# Patient Record
Sex: Female | Born: 1945 | Race: White | Hispanic: No | Marital: Married | Smoking: Never smoker
Health system: Southern US, Community
[De-identification: ages and names within clinical notes are randomized; demographics above are authoritative.]

## PROBLEM LIST (undated history)

## (undated) DIAGNOSIS — G8929 Other chronic pain: Secondary | ICD-10-CM

## (undated) DIAGNOSIS — E876 Hypokalemia: Secondary | ICD-10-CM

## (undated) DIAGNOSIS — Z8489 Family history of other specified conditions: Secondary | ICD-10-CM

## (undated) DIAGNOSIS — C50919 Malignant neoplasm of unspecified site of unspecified female breast: Secondary | ICD-10-CM

## (undated) DIAGNOSIS — T4145XA Adverse effect of unspecified anesthetic, initial encounter: Secondary | ICD-10-CM

## (undated) DIAGNOSIS — Z972 Presence of dental prosthetic device (complete) (partial): Secondary | ICD-10-CM

## (undated) DIAGNOSIS — E782 Mixed hyperlipidemia: Secondary | ICD-10-CM

## (undated) DIAGNOSIS — K219 Gastro-esophageal reflux disease without esophagitis: Secondary | ICD-10-CM

## (undated) DIAGNOSIS — H9190 Unspecified hearing loss, unspecified ear: Secondary | ICD-10-CM

## (undated) DIAGNOSIS — M810 Age-related osteoporosis without current pathological fracture: Secondary | ICD-10-CM

## (undated) DIAGNOSIS — K5730 Diverticulosis of large intestine without perforation or abscess without bleeding: Secondary | ICD-10-CM

## (undated) DIAGNOSIS — R0989 Other specified symptoms and signs involving the circulatory and respiratory systems: Secondary | ICD-10-CM

## (undated) DIAGNOSIS — I5032 Chronic diastolic (congestive) heart failure: Secondary | ICD-10-CM

## (undated) DIAGNOSIS — I272 Pulmonary hypertension, unspecified: Secondary | ICD-10-CM

## (undated) DIAGNOSIS — R7303 Prediabetes: Secondary | ICD-10-CM

## (undated) DIAGNOSIS — M1990 Unspecified osteoarthritis, unspecified site: Secondary | ICD-10-CM

## (undated) DIAGNOSIS — R519 Headache, unspecified: Secondary | ICD-10-CM

## (undated) DIAGNOSIS — D649 Anemia, unspecified: Secondary | ICD-10-CM

## (undated) DIAGNOSIS — I10 Essential (primary) hypertension: Secondary | ICD-10-CM

## (undated) DIAGNOSIS — K449 Diaphragmatic hernia without obstruction or gangrene: Secondary | ICD-10-CM

## (undated) DIAGNOSIS — R079 Chest pain, unspecified: Secondary | ICD-10-CM

## (undated) DIAGNOSIS — T8859XA Other complications of anesthesia, initial encounter: Secondary | ICD-10-CM

## (undated) DIAGNOSIS — R51 Headache: Secondary | ICD-10-CM

## (undated) DIAGNOSIS — R011 Cardiac murmur, unspecified: Secondary | ICD-10-CM

## (undated) DIAGNOSIS — I340 Nonrheumatic mitral (valve) insufficiency: Secondary | ICD-10-CM

## (undated) DIAGNOSIS — K222 Esophageal obstruction: Secondary | ICD-10-CM

## (undated) DIAGNOSIS — G629 Polyneuropathy, unspecified: Secondary | ICD-10-CM

## (undated) HISTORY — DX: Other chronic pain: G89.29

## (undated) HISTORY — PX: OOPHORECTOMY: SHX86

## (undated) HISTORY — DX: Unspecified hearing loss, unspecified ear: H91.90

## (undated) HISTORY — DX: Esophageal obstruction: K22.2

## (undated) HISTORY — DX: Hypokalemia: E87.6

## (undated) HISTORY — DX: Other specified symptoms and signs involving the circulatory and respiratory systems: R09.89

## (undated) HISTORY — DX: Chronic diastolic (congestive) heart failure: I50.32

## (undated) HISTORY — DX: Headache, unspecified: R51.9

## (undated) HISTORY — DX: Chest pain, unspecified: R07.9

## (undated) HISTORY — DX: Diverticulosis of large intestine without perforation or abscess without bleeding: K57.30

## (undated) HISTORY — DX: Mixed hyperlipidemia: E78.2

## (undated) HISTORY — DX: Unspecified osteoarthritis, unspecified site: M19.90

## (undated) HISTORY — DX: Pulmonary hypertension, unspecified: I27.20

## (undated) HISTORY — DX: Malignant neoplasm of unspecified site of unspecified female breast: C50.919

## (undated) HISTORY — PX: JOINT REPLACEMENT: SHX530

## (undated) HISTORY — DX: Essential (primary) hypertension: I10

## (undated) HISTORY — DX: Nonrheumatic mitral (valve) insufficiency: I34.0

## (undated) HISTORY — DX: Gastro-esophageal reflux disease without esophagitis: K21.9

## (undated) HISTORY — DX: Diaphragmatic hernia without obstruction or gangrene: K44.9

## (undated) HISTORY — PX: CARDIAC CATHETERIZATION: SHX172

## (undated) HISTORY — DX: Headache: R51

---

## 1976-01-22 HISTORY — PX: ABDOMINAL HYSTERECTOMY: SHX81

## 1976-01-22 HISTORY — PX: CHOLECYSTECTOMY: SHX55

## 2001-08-14 ENCOUNTER — Encounter: Payer: Self-pay | Admitting: Gastroenterology

## 2003-05-06 ENCOUNTER — Encounter: Payer: Self-pay | Admitting: Gastroenterology

## 2003-11-29 ENCOUNTER — Ambulatory Visit: Payer: Self-pay | Admitting: Family Medicine

## 2003-12-03 ENCOUNTER — Emergency Department: Payer: Self-pay | Admitting: Emergency Medicine

## 2005-01-24 ENCOUNTER — Other Ambulatory Visit: Payer: Self-pay

## 2005-01-25 ENCOUNTER — Emergency Department: Payer: Self-pay | Admitting: Emergency Medicine

## 2005-08-25 ENCOUNTER — Emergency Department: Payer: Self-pay | Admitting: Unknown Physician Specialty

## 2007-02-25 ENCOUNTER — Ambulatory Visit: Payer: Self-pay | Admitting: Family Medicine

## 2007-03-04 ENCOUNTER — Ambulatory Visit: Payer: Self-pay | Admitting: Family Medicine

## 2007-12-18 ENCOUNTER — Emergency Department: Payer: Self-pay | Admitting: Emergency Medicine

## 2008-01-24 ENCOUNTER — Emergency Department: Payer: Self-pay | Admitting: Emergency Medicine

## 2008-02-03 ENCOUNTER — Inpatient Hospital Stay: Payer: Self-pay | Admitting: Internal Medicine

## 2008-09-06 ENCOUNTER — Encounter: Payer: Self-pay | Admitting: Orthopedic Surgery

## 2008-09-21 ENCOUNTER — Encounter: Payer: Self-pay | Admitting: Orthopedic Surgery

## 2008-10-21 ENCOUNTER — Encounter: Payer: Self-pay | Admitting: Orthopedic Surgery

## 2008-11-03 ENCOUNTER — Ambulatory Visit: Payer: Self-pay | Admitting: Family Medicine

## 2008-11-03 DIAGNOSIS — Z8711 Personal history of peptic ulcer disease: Secondary | ICD-10-CM

## 2008-11-03 DIAGNOSIS — G43009 Migraine without aura, not intractable, without status migrainosus: Secondary | ICD-10-CM | POA: Insufficient documentation

## 2008-11-03 DIAGNOSIS — K5730 Diverticulosis of large intestine without perforation or abscess without bleeding: Secondary | ICD-10-CM | POA: Insufficient documentation

## 2008-11-03 DIAGNOSIS — I10 Essential (primary) hypertension: Secondary | ICD-10-CM | POA: Insufficient documentation

## 2008-11-03 DIAGNOSIS — I152 Hypertension secondary to endocrine disorders: Secondary | ICD-10-CM | POA: Insufficient documentation

## 2008-11-03 DIAGNOSIS — K449 Diaphragmatic hernia without obstruction or gangrene: Secondary | ICD-10-CM | POA: Insufficient documentation

## 2008-11-03 DIAGNOSIS — R011 Cardiac murmur, unspecified: Secondary | ICD-10-CM | POA: Insufficient documentation

## 2008-11-03 DIAGNOSIS — E1159 Type 2 diabetes mellitus with other circulatory complications: Secondary | ICD-10-CM | POA: Insufficient documentation

## 2008-11-03 DIAGNOSIS — J301 Allergic rhinitis due to pollen: Secondary | ICD-10-CM

## 2008-11-08 ENCOUNTER — Ambulatory Visit: Payer: Self-pay | Admitting: Gastroenterology

## 2008-11-08 ENCOUNTER — Encounter (INDEPENDENT_AMBULATORY_CARE_PROVIDER_SITE_OTHER): Payer: Self-pay | Admitting: *Deleted

## 2008-11-09 ENCOUNTER — Ambulatory Visit: Payer: Self-pay | Admitting: Gastroenterology

## 2008-11-29 ENCOUNTER — Ambulatory Visit: Payer: Self-pay | Admitting: Family Medicine

## 2008-12-02 ENCOUNTER — Telehealth (INDEPENDENT_AMBULATORY_CARE_PROVIDER_SITE_OTHER): Payer: Self-pay | Admitting: Internal Medicine

## 2009-01-26 ENCOUNTER — Ambulatory Visit: Payer: Self-pay | Admitting: Family Medicine

## 2009-01-26 LAB — CONVERTED CEMR LAB: LDL Cholesterol: 79 mg/dL

## 2009-01-27 LAB — CONVERTED CEMR LAB
ALT: 16 units/L (ref 0–35)
AST: 22 units/L (ref 0–37)
BUN: 12 mg/dL (ref 6–23)
Bilirubin, Direct: 0 mg/dL (ref 0.0–0.3)
CO2: 23 meq/L (ref 19–32)
Chloride: 109 meq/L (ref 96–112)
Cholesterol: 174 mg/dL (ref 0–200)
Creatinine, Ser: 0.7 mg/dL (ref 0.4–1.2)
Potassium: 3.9 meq/L (ref 3.5–5.1)
Total Bilirubin: 0.6 mg/dL (ref 0.3–1.2)
Total Protein: 6.4 g/dL (ref 6.0–8.3)

## 2009-01-30 ENCOUNTER — Telehealth: Payer: Self-pay | Admitting: Family Medicine

## 2009-02-03 ENCOUNTER — Ambulatory Visit: Payer: Self-pay | Admitting: Family Medicine

## 2009-02-03 DIAGNOSIS — E1140 Type 2 diabetes mellitus with diabetic neuropathy, unspecified: Secondary | ICD-10-CM | POA: Insufficient documentation

## 2009-02-03 DIAGNOSIS — E1169 Type 2 diabetes mellitus with other specified complication: Secondary | ICD-10-CM | POA: Insufficient documentation

## 2009-02-03 DIAGNOSIS — E7800 Pure hypercholesterolemia, unspecified: Secondary | ICD-10-CM

## 2009-02-03 DIAGNOSIS — E785 Hyperlipidemia, unspecified: Secondary | ICD-10-CM | POA: Insufficient documentation

## 2009-02-08 ENCOUNTER — Encounter: Payer: Self-pay | Admitting: Family Medicine

## 2009-05-05 ENCOUNTER — Ambulatory Visit: Payer: Self-pay | Admitting: Family Medicine

## 2009-05-08 ENCOUNTER — Telehealth: Payer: Self-pay | Admitting: Family Medicine

## 2009-05-10 ENCOUNTER — Encounter: Payer: Self-pay | Admitting: Family Medicine

## 2009-05-10 ENCOUNTER — Telehealth: Payer: Self-pay | Admitting: Family Medicine

## 2009-05-10 LAB — CONVERTED CEMR LAB
ALT: 14 units/L (ref 0–35)
AST: 17 units/L (ref 0–37)
Alkaline Phosphatase: 65 units/L (ref 39–117)
BUN: 16 mg/dL (ref 6–23)
Bilirubin, Direct: 0 mg/dL (ref 0.0–0.3)
Chloride: 103 meq/L (ref 96–112)
GFR calc non Af Amer: 106.33 mL/min (ref >60)
HDL: 71.1 mg/dL (ref >39.00)
Potassium: 4.2 meq/L (ref 3.5–5.1)
Sodium: 140 meq/L (ref 135–145)
Total Bilirubin: 0.3 mg/dL (ref 0.3–1.2)
VLDL: 26 mg/dL (ref 0.0–40.0)

## 2009-05-18 ENCOUNTER — Encounter: Payer: Self-pay | Admitting: Family Medicine

## 2009-05-18 ENCOUNTER — Telehealth (INDEPENDENT_AMBULATORY_CARE_PROVIDER_SITE_OTHER): Payer: Self-pay | Admitting: *Deleted

## 2009-05-19 ENCOUNTER — Telehealth: Payer: Self-pay | Admitting: Family Medicine

## 2009-06-30 ENCOUNTER — Ambulatory Visit: Payer: Self-pay | Admitting: Family Medicine

## 2009-07-21 ENCOUNTER — Ambulatory Visit: Payer: Self-pay | Admitting: Family Medicine

## 2009-07-25 LAB — CONVERTED CEMR LAB
AST: 17 units/L (ref 0–37)
Albumin: 4 g/dL (ref 3.5–5.2)
Alkaline Phosphatase: 79 units/L (ref 39–117)
Bilirubin, Direct: 0.1 mg/dL (ref 0.0–0.3)
Calcium: 9.2 mg/dL (ref 8.4–10.5)
GFR calc non Af Amer: 106.26 mL/min (ref >60)
Glucose, Bld: 105 mg/dL — ABNORMAL HIGH (ref 70–99)
HDL: 67.9 mg/dL (ref >39.00)
Potassium: 4.1 meq/L (ref 3.5–5.1)
Sodium: 141 meq/L (ref 135–145)
Total Bilirubin: 0.3 mg/dL (ref 0.3–1.2)
Total CHOL/HDL Ratio: 3
Triglycerides: 119 mg/dL (ref 0.0–149.0)
VLDL: 23.8 mg/dL (ref 0.0–40.0)

## 2009-07-26 ENCOUNTER — Ambulatory Visit: Payer: Self-pay | Admitting: Family Medicine

## 2009-09-15 ENCOUNTER — Telehealth (INDEPENDENT_AMBULATORY_CARE_PROVIDER_SITE_OTHER): Payer: Self-pay | Admitting: *Deleted

## 2009-09-15 ENCOUNTER — Ambulatory Visit: Payer: Self-pay | Admitting: Family Medicine

## 2009-09-29 ENCOUNTER — Telehealth: Payer: Self-pay | Admitting: Family Medicine

## 2009-10-19 ENCOUNTER — Telehealth: Payer: Self-pay | Admitting: Family Medicine

## 2009-11-08 ENCOUNTER — Telehealth: Payer: Self-pay | Admitting: Family Medicine

## 2009-11-16 ENCOUNTER — Ambulatory Visit: Payer: Self-pay | Admitting: Family Medicine

## 2009-11-23 ENCOUNTER — Ambulatory Visit: Payer: Self-pay | Admitting: Family Medicine

## 2009-11-23 DIAGNOSIS — IMO0002 Reserved for concepts with insufficient information to code with codable children: Secondary | ICD-10-CM | POA: Insufficient documentation

## 2009-11-23 DIAGNOSIS — M1710 Unilateral primary osteoarthritis, unspecified knee: Secondary | ICD-10-CM

## 2009-11-30 ENCOUNTER — Ambulatory Visit: Payer: Self-pay | Admitting: Family Medicine

## 2009-12-20 ENCOUNTER — Ambulatory Visit: Payer: Self-pay | Admitting: Family Medicine

## 2009-12-22 LAB — CONVERTED CEMR LAB
ALT: 12 units/L (ref 0–35)
AST: 19 units/L (ref 0–37)
BUN: 16 mg/dL (ref 6–23)
Bilirubin, Direct: 0.1 mg/dL (ref 0.0–0.3)
CO2: 29 meq/L (ref 19–32)
Chloride: 104 meq/L (ref 96–112)
Cholesterol: 188 mg/dL (ref 0–200)
Creatinine,U: 136.9 mg/dL
HDL: 59 mg/dL (ref >39.00)
LDL Cholesterol: 96 mg/dL (ref 0–99)
Potassium: 3.8 meq/L (ref 3.5–5.1)
Total Bilirubin: 0.3 mg/dL (ref 0.3–1.2)
Total Protein: 6.6 g/dL (ref 6.0–8.3)
Triglycerides: 163 mg/dL — ABNORMAL HIGH (ref 0.0–149.0)

## 2009-12-27 ENCOUNTER — Ambulatory Visit: Payer: Self-pay | Admitting: Family Medicine

## 2009-12-27 DIAGNOSIS — E1142 Type 2 diabetes mellitus with diabetic polyneuropathy: Secondary | ICD-10-CM | POA: Insufficient documentation

## 2009-12-27 LAB — CONVERTED CEMR LAB: Cholesterol, target level: 200 mg/dL

## 2010-02-07 ENCOUNTER — Telehealth: Payer: Self-pay | Admitting: Family Medicine

## 2010-02-20 ENCOUNTER — Encounter: Payer: Self-pay | Admitting: Family Medicine

## 2010-02-20 NOTE — Progress Notes (Signed)
Summary: wants ortho referral  Phone Note Call from Patient Call back at [phone]   Caller: Patient Call For: Ashley Nora MD Summary of Call: Pt requests referral to ortho regarding her arthritis.  She has been going to [place] for years but wants to change.  She prefers to see someone in [place]. Initial call taken by: Lowella Petties CMA,  May 18, 2009 2:26 PM  Follow-up for Phone Call        Orthep appt has been scheduled for May 4th in [place].Daine Gip  May 29, 2009 2:38 PM  Follow-up by: Daine Gip,  May 29, 2009 2:38 PM

## 2010-02-20 NOTE — Progress Notes (Signed)
Summary: Diabetic supplies ([facility])  Phone Note From Pharmacy   Caller: [facility] Call For: Dr. Ermalene Searing  Summary of Call: Received faxed form for diabetic supplies.  Form in your IN box.   Initial call taken by: Linde Gillis CMA [facility]),  May 19, 2009 9:22 AM

## 2010-02-20 NOTE — Miscellaneous (Signed)
Summary: Waiver of Libaility Form  Waiver of Libaility Form   Imported By: Beau Fanny 11/16/2009 13:46:49  _____________________________________________________________________  External Attachment:    Type:   Image     Comment:   External Document

## 2010-02-20 NOTE — Progress Notes (Signed)
Summary: Synivisc Injection...  Phone Note Outgoing Call   Summary of Call: FYI... Pt is interested in the Synvisc Injection.  She will be back in contact w/ use once she speak w/ her ins co..Cynthia Davis  October 19, 2009 9:12 AM  Initial call taken by: Daine Gip,  October 19, 2009 9:12 AM

## 2010-02-20 NOTE — Progress Notes (Signed)
Summary: Synvisc form faxed for ins vertification....  Phone Note Outgoing Call   Summary of Call: Faxed for to vertify ins coverage for Synvisc.Marland KitchenMarland KitchenDaine Gip  September 15, 2009 2:12 PM  Initial call taken by: Daine Gip,  September 15, 2009 2:12 PM  Follow-up for Phone Call        Synvisc is now using a new form. Sent to Dr. Patsy Lager to sign and return to be refaxed.Marland KitchenMarland KitchenDaine Gip  September 18, 2009 1:46 PM  Follow-up by: Daine Gip,  September 18, 2009 1:46 PM  Additional Follow-up for Phone Call Additional follow up Details #1::        Received Form back from Dr. Patsy Lager,  Faxed to [employer].Marland KitchenMarland KitchenDaine Gip  September 19, 2009 11:11 AM  Additional Follow-up by: Daine Gip,  September 19, 2009 11:12 AM

## 2010-02-20 NOTE — Miscellaneous (Signed)
Summary: Waiver of Liability Form  Waiver of Liability Form   Imported By: Beau Fanny 11/23/2009 14:00:30  _____________________________________________________________________  External Attachment:    Type:   Image     Comment:   External Document

## 2010-02-20 NOTE — Assessment & Plan Note (Signed)
Summary: SYNVISC INJECTION  CYD   Vital Signs:  Patient profile:   65 year old female Height:      60 inches Weight:      150 pounds BMI:     29.40 Temp:     97.8 degrees F oral Pulse rate:   80 / minute Pulse rhythm:   regular BP sitting:   128 / 76  (left arm) Cuff size:   regular  Vitals Entered By: Lewanda Rife LPN (November 16, 2009 10:52 AM) CC: injection,synvisc   History of Present Illness: Right knee, synvisc series.  Allergies: 1)  ! Sulfa   Impression & Recommendations:  Problem # 1:  OSTEOARTHRITIS, KNEE, RIGHT (ICD-715.96)  Knee Injection: Synvisc-3, R Patient verbally consented to procedure. Risks, benefits, and alternatives explained. Sterilely prepped with betadine. Ethyl cholride used for anesthesia, then 5 cc of Lidocaine 1% used for anesthesia in the anterolateral position. Reprepped with Betadine.  Anterolateral approach without difficulty, injected with Synvisc-3, 2 mL. No complications with procedure and tolerated well.   Her updated medication list for this problem includes:    Aspirin 81 Mg Tbec (Aspirin) ..... One a day    Tramadol Hcl 50 Mg Tabs (Tramadol hcl) ..... One tablet by mouth every day as needed for knee pain  Orders: Joint Aspirate / Injection, Large (16109) Synvisc-Three (16 units) (U0454)  Complete Medication List: 1)  Hydrochlorothiazide 25 Mg Tabs (Hydrochlorothiazide) .... One a day 2)  Amlodipine Besylate 5 Mg Tabs (Amlodipine besylate) .... One a day 3)  Fluoxetine Hcl 20 Mg Caps (Fluoxetine hcl) .... One a day 4)  Nexium 40 Mg Cpdr (Esomeprazole magnesium) .Marland Kitchen.. 1 tab by mouth daily 5)  Aspirin 81 Mg Tbec (Aspirin) .... One a day 6)  Flonase 50 Mcg/act Susp (Fluticasone propionate) .... As needed 7)  Tenormin 100 Mg Tabs (Atenolol) .... One a day 8)  Claritin 10 Mg Tabs (Loratadine) .... One a day 9)  Calcium Plus Vitamin D 600-100 Mg-unit Caps (Calcium carbonate-vitamin d) .... One a day 10)  Glucosamine 500 Mg Caps  (Glucosamine sulfate) .Marland Kitchen.. 1 a day 11)  Natural Fiber Therapy 30.9 % Powd (Psyllium) .Marland Kitchen.. 1 a day 12)  Tramadol Hcl 50 Mg Tabs (Tramadol hcl) .... One tablet by mouth every day as needed for knee pain  Other Orders: Flu Vaccine 72yrs + MEDICARE PATIENTS (U9811) Administration Flu vaccine - MCR (B1478) Prescriptions: TRAMADOL HCL 50 MG TABS (TRAMADOL HCL) one tablet by mouth every day as needed for knee pain  #50 x 3   Entered and Authorized by:   Hannah Beat MD   Signed by:   Hannah Beat MD on 11/16/2009   Method used:   Print then Give to Patient   RxID:   [id number]    Orders Added: 1)  Flu Vaccine 8yrs + MEDICARE PATIENTS [Q2039] 2)  Administration Flu vaccine - MCR [G0008] 3)  Joint Aspirate / Injection, Large [20610] 4)  Synvisc-Three (16 units) [J7325]    Current Allergies (reviewed today): ! SULFAFlu Vaccine Consent Questions     Do you have a history of severe allergic reactions to this vaccine? no    Any prior history of allergic reactions to egg and/or gelatin? no    Do you have a sensitivity to the preservative Thimersol? no    Do you have a past history of Guillan-Barre Syndrome? no    Do you currently have an acute febrile illness? no    Have you ever had a severe reaction  to latex? no    Vaccine information given and explained to patient? yes    Are you currently pregnant? no    Lot Number:[id number]   Exp Date:07/21/2010   Site Given  Left Deltoid IM Lewanda Rife LPN  November 16, 2009 10:58 AM     .Jacquelyne Balint

## 2010-02-20 NOTE — Medication Information (Signed)
Summary: Diabetes Supplies/[facility]  Diabetes Supplies/[facility]   Imported By: Lanelle Bal 06/09/2009 14:07:03  _____________________________________________________________________  External Attachment:    Type:   Image     Comment:   External Document

## 2010-02-20 NOTE — Assessment & Plan Note (Signed)
Summary: 6 M F/U DLO   Vital Signs:  Patient profile:   65 year old female Height:      60 inches Weight:      149.0 pounds BMI:     29.20 Temp:     98.1 degrees F oral Pulse rate:   76 / minute Pulse rhythm:   regular BP sitting:   110 / 70  (left arm) Cuff size:   regular  Vitals Entered By: Benny Lennert CMA [facility]) (December 27, 2009 8:31 AM)  History of Present Illness: Chief complaint 6 month follow up   Right Knee Pain.Marland KitchenSynvisc injections by Dr. Patsy Lager last injection 1 month ago...minimal improvement. Has [facility] Riley Lam [facility]...in past for arthroscopic surgery 5 years ago. Keeping her up at night.  Plans to call Dr. Riley Lam after Christmas.  5 lb weight loss..not able to exercise due to knee.   Hypertension History:      She denies headache, chest pain, palpitations, orthopnea, neurologic problems, and syncope.  Well Controlled at home.        Positive major cardiovascular risk factors include female age 33 years old or older, diabetes, hyperlipidemia, and hypertension.  Negative major cardiovascular risk factors include non-tobacco-user status.    Lipid Management History:      Positive NCEP/ATP III risk factors include female age 21 years old or older, diabetes, and hypertension.  Negative NCEP/ATP III risk factors include non-tobacco-user status.        Her compliance with the TLC diet is good.  Adjunctive measures started by the patient include fiber and weight reduction.  She expresses no side effects from her lipid-lowering medication.  Comments: Cutting back on sweet items.    Problems Prior to Update: 1)  Osteoarthritis, Knee, Right  (ICD-715.96) 2)  Screening, Pulmonary Tuberculosis  (ICD-V74.1) 3)  Other Screening Mammogram  (ICD-V76.12) 4)  Routine Gynecological Examination  (ICD-V72.31) 5)  Preventive Health Care  (ICD-V70.0) 6)  Hypertriglyceridemia  (ICD-272.1) 7)  Diabetes Mellitus, Borderline  (ICD-790.29) 8)  Sinusitis- Acute-nos  (ICD-461.9) 9)   Essential Hypertension, Benign  (ICD-401.1) 10)  Ankle Pain, Left  (ICD-719.47) 11)  Dysphagia Unspecified  (ICD-787.20) 12)  Diverticulosis of Colon  (ICD-562.10) 13)  Osteoarthritis, Knee, Right  (ICD-715.96) 14)  Hiatal Hernia With Reflux  (ICD-553.3) 15)  Family History Breast Cancer 1st Degree Relative <50  (ICD-V16.3) 16)  Murmur  (ICD-785.2) 17)  Allergic Rhinitis Due To Pollen  (ICD-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 Sulfa  Past History:  Past medical, surgical, family and social histories (including risk factors) reviewed, and no changes noted (except as noted below).  Past Medical History: Reviewed history from 11/08/2008 and no changes required. arthritis Chronic headaches Diverticulosis Hypertension  Past Surgical History: Reviewed history from 02/03/2009 and no changes required. cholecystectomy 1987 Hysterectomy, patrial..one ovary remains  Family History: Reviewed history from 11/08/2008 and no changes required. no colon cancer or esophagus cancer  Social History: Reviewed history from 11/29/2008 and no changes required. drinks sweet tea, diet sodas 4 a day married, 2 children, retired, nonsmoker, does not drink alcohol. occupation--retired CNA  Review of Systems       some burning in toes, bottoms of feet General:  Denies fatigue and fever. CV:  Denies chest pain or discomfort. Resp:  Denies shortness of breath. GI:  Denies abdominal pain,  bloody stools, constipation, and diarrhea. GU:  Denies dysuria.  Physical Exam  General:  Well-developed,well-nourished,in no acute distress; alert,appropriate and cooperative throughout examination Mouth:  Oral mucosa and oropharynx without lesions or exudates.  Teeth in good repair. Neck:  no cervical or supraclavicular lymphadenopathy no carotid bruit or thyromegaly  Lungs:   Normal respiratory effort, chest expands symmetrically. Lungs are clear to auscultation, no crackles or wheezes. Heart:  Normal rate and regular rhythm. S1 and S2 normal without gallop, murmur, click, rub or other extra sounds. Abdomen:  Bowel sounds positive,abdomen soft and non-tender without masses, organomegaly or hernias noted. Pulses:  R and L posterior tibial pulses are full and equal bilaterally  Extremities:  no edema, B varicosities  Diabetes Management Exam:    Foot Exam (with socks and/or shoes not present):       Sensory-Pinprick/Light touch:          Left medial foot (L-4): normal          Left dorsal foot (L-5): normal          Left lateral foot (S-1): normal          Right medial foot (L-4): normal          Right dorsal foot (L-5): normal          Right lateral foot (S-1): normal       Sensory-Monofilament:          Left foot: diminished          Right foot: diminished       Inspection:          Left foot: normal          Right foot: normal       Nails:          Left foot: normal          Right foot: normal   Impression & Recommendations:  Problem # 1:  DIABETES MELLITUS, TYPE II (ICD-250.00) Well controlled with lifestyle. Encouraged exercise, weight loss, healthy eating habits.  Her updated medication list for this problem includes:    Aspirin 81 Mg Tbec (Aspirin) ..... One a day  Problem # 2:  ESSENTIAL HYPERTENSION, BENIGN (ICD-401.1)  Well controlled. Continue current medication.  Her updated medication list for this problem includes:    Hydrochlorothiazide 25 Mg Tabs (Hydrochlorothiazide) ..... One a day    Amlodipine Besylate 5 Mg Tabs (Amlodipine besylate) ..... One a day    Tenormin 100 Mg Tabs (Atenolol) ..... One a day  BP today: 110/70 Prior BP: 120/80 (11/30/2009)  10 Yr Risk Heart Disease: 8 % Prior 10 Yr Risk Heart Disease: 7 % (02/03/2009)  Labs Reviewed: K+: 3.8 (12/20/2009) Creat: : 0.6 (12/20/2009)   Chol: 188 (12/20/2009)   HDL:  59.00 (12/20/2009)   LDL: 96 (12/20/2009)   TG: 163.0 (12/20/2009)  Problem # 3:  HYPERTRIGLYCERIDEMIA (ICD-272.1) Triglyceridedes minimally elevated. LDL at goal <100. Encouraged exercise, weight loss, healthy eating habits.   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 Will check TSH and B12 at next lab check.  Her updated medication list for this problem includes:    Aspirin 81 Mg Tbec (Aspirin) ..... One a day  Complete Medication List: 1)  Hydrochlorothiazide 25 Mg Tabs (Hydrochlorothiazide) .... One a day 2)  Amlodipine Besylate 5 Mg Tabs (Amlodipine besylate) .... One a day 3)  Fluoxetine Hcl 20 Mg Caps (Fluoxetine hcl) .... One a  day 4)  Nexium 40 Mg Cpdr (Esomeprazole magnesium) .Marland Kitchen.. 1 tab by mouth daily 5)  Aspirin 81 Mg Tbec (Aspirin) .... One a day 6)  Flonase 50 Mcg/act Susp (Fluticasone propionate) .... As needed 7)  Tenormin 100 Mg Tabs (Atenolol) .... One a day 8)  Claritin 10 Mg Tabs (Loratadine) .... One a day 9)  Calcium Plus Vitamin D 600-100 Mg-unit Caps (Calcium carbonate-vitamin d) .... One a day 10)  Glucosamine 500 Mg Caps (Glucosamine sulfate) .Marland Kitchen.. 1 a day 11)  Natural Fiber Therapy 30.9 % Powd (Psyllium) .Marland Kitchen.. 1 a day 12)  Tramadol Hcl 50 Mg Tabs (Tramadol hcl) .... One tablet by mouth every day as needed for knee pain 13)  Gabapentin 100 Mg Caps (Gabapentin) .Marland Kitchen.. 1 tab by mouth at bedtime for peripheral neuropathy, may increase to 2 tabs daily if tolerated and symptoms not resolved.  Hypertension Assessment/Plan:      The patient's hypertensive risk group is category C: Target organ damage and/or diabetes.  Her calculated 10 year risk of coronary heart disease is 8 %.  Today's blood pressure is 110/70.  Her blood pressure goal is < 140/90.  Lipid Assessment/Plan:      Based on NCEP/ATP III, the patient's risk factor category is "history of diabetes".  The patient's lipid goals are  as follows: Total cholesterol goal is 200; LDL cholesterol goal is 100; HDL cholesterol goal is 40; Triglyceride goal is 150.  Her LDL cholesterol goal has been met.    Patient Instructions: 1)  Please schedule a follow-up appointment in 3 months for CPX. 2)  HgBA1c prior to visit  ICD-9: 250.00 3)  TSH, B12 Dx 780.79 Prescriptions: GABAPENTIN 100 MG CAPS (GABAPENTIN) 1 tab by mouth at bedtime for peripheral neuropathy, may increase to 2 tabs daily if tolerated and symptoms not resolved.  #30 x 1   Entered and Authorized by:   Kerby Nora MD   Signed by:   Kerby Nora MD on 12/27/2009   Method used:   Electronically to        [facility] [phone]* (retail)       [address]       [place], [place]  [address]       Ph: [phone]       Fax: [phone]   RxID:   [id number] NEXIUM 40 MG CPDR (ESOMEPRAZOLE MAGNESIUM) 1 tab by mouth daily  #90 x 3   Entered and Authorized by:   Kerby Nora MD   Signed by:   Kerby Nora MD on 12/27/2009   Method used:   Electronically to        [facility] [phone]* (retail)       [address]       [place], [place]  [address]       Ph: [phone]       Fax: [phone]   RxID:   [id number] TENORMIN 100 MG TABS (ATENOLOL) one a day  #90 x 3   Entered and Authorized by:   Kerby Nora MD   Signed by:   Kerby Nora MD on 12/27/2009   Method used:   Faxed to ...       [facility] (mail-order)             , [place]         Ph: [phone]  Fax: [phone]   RxID:   [id number] FLONASE 50 MCG/ACT SUSP (FLUTICASONE PROPIONATE) as needed  #3 x 3   Entered and Authorized by:   Kerby Nora MD   Signed by:   Kerby Nora MD on 12/27/2009   Method used:   Faxed to ...       [facility] (mail-order)             , [place]         Ph: [phone]       Fax: [phone]   RxID:   [id number] FLUOXETINE HCL 20 MG CAPS (FLUOXETINE HCL) one a day  #90 x 3   Entered and Authorized by:   Kerby Nora  MD   Signed by:   Kerby Nora MD on 12/27/2009   Method used:   Faxed to ...       [facility] (mail-order)             , [place]         Ph: [phone]       Fax: [phone]   RxID:   [id number] AMLODIPINE BESYLATE 5 MG TABS (AMLODIPINE BESYLATE) one a day  #90 x 3   Entered and Authorized by:   Kerby Nora MD   Signed by:   Kerby Nora MD on 12/27/2009   Method used:   Faxed to ...       [facility] (mail-order)             , [place]         Ph: [phone]       Fax: [phone]   RxID:   [id number] HYDROCHLOROTHIAZIDE 25 MG TABS (HYDROCHLOROTHIAZIDE) one a day  #90 x 3   Entered and Authorized by:   Kerby Nora MD   Signed by:   Kerby Nora MD on 12/27/2009   Method used:   Faxed to ...       [facility] (mail-order)             , [place]         Ph: [phone]       Fax: [phone]   RxID:   [id number]      Current Allergies (reviewed today): ! SULFA  Last Flu Vaccine:  Fluvax 3+ (11/16/2009 10:36:03 AM) Flu Vaccine Next Due:  1 yr Last LDL:  96 (12/20/2009 8:37:16 AM) LDL Next Due:  6 mo

## 2010-02-20 NOTE — Assessment & Plan Note (Signed)
Summary: SYNVISC INJECTION  CYD   Vital Signs:  Patient profile:   65 year old female Height:      60 inches Weight:      150.0 pounds BMI:     29.40 Temp:     98.1 degrees F oral Pulse rate:   76 / minute Pulse rhythm:   regular BP sitting:   120 / 82  (left arm) Cuff size:   regular  Vitals Entered By: Benny Lennert CMA [facility] Dull) (November 23, 2009 11:01 AM)  History of Present Illness: Chief complaint synvisc injection  Allergies: 1)  ! Sulfa   Impression & Recommendations:  Problem # 1:  OSTEOARTHRITIS, KNEE, RIGHT (ICD-715.96)  Knee Injection: Synvisc-3, RIGHT, # 2 of series Patient verbally consented to procedure. Risks, benefits, and alternatives explained. Sterilely prepped with betadine. Ethyl cholride used for anesthesia, then 5 cc of Lidocaine 1% used for anesthesia in the anterolateral position. Reprepped with Betadine.  Anterolateral approach without difficulty, injected with Synvisc-3, 2 mL. No complications with procedure and tolerated well.   Her updated medication list for this problem includes:    Aspirin 81 Mg Tbec (Aspirin) ..... One a day    Tramadol Hcl 50 Mg Tabs (Tramadol hcl) ..... One tablet by mouth every day as needed for knee pain  Orders: Joint Aspirate / Injection, Large (24401) Synvisc-Three (16 units) (U2725)  Complete Medication List: 1)  Hydrochlorothiazide 25 Mg Tabs (Hydrochlorothiazide) .... One a day 2)  Amlodipine Besylate 5 Mg Tabs (Amlodipine besylate) .... One a day 3)  Fluoxetine Hcl 20 Mg Caps (Fluoxetine hcl) .... One a day 4)  Nexium 40 Mg Cpdr (Esomeprazole magnesium) .Marland Kitchen.. 1 tab by mouth daily 5)  Aspirin 81 Mg Tbec (Aspirin) .... One a day 6)  Flonase 50 Mcg/act Susp (Fluticasone propionate) .... As needed 7)  Tenormin 100 Mg Tabs (Atenolol) .... One a day 8)  Claritin 10 Mg Tabs (Loratadine) .... One a day 9)  Calcium Plus Vitamin D 600-100 Mg-unit Caps (Calcium carbonate-vitamin d) .... One a day 10)  Glucosamine 500  Mg Caps (Glucosamine sulfate) .Marland Kitchen.. 1 a day 11)  Natural Fiber Therapy 30.9 % Powd (Psyllium) .Marland Kitchen.. 1 a day 12)  Tramadol Hcl 50 Mg Tabs (Tramadol hcl) .... One tablet by mouth every day as needed for knee pain   Orders Added: 1)  Joint Aspirate / Injection, Large [20610] 2)  Synvisc-Three (16 units) [J7325]    Current Allergies (reviewed today): ! SULFA

## 2010-02-20 NOTE — Progress Notes (Signed)
Summary: Synvisc Update...  Phone Note Call from Patient   Caller: Patient Summary of Call: Called pt says she called her ins, says she will be responsible for 20% of the total charge.  She has been  trying to save for the 20%. Recommended pymt arrangement.  Pt agreed to schedule and make pymt arrangements. Request to [facility] to order mediations.Marland KitchenMarland KitchenDaine Gip  November 08, 2009 3:29 PM   Follow-up for Phone Call        Discussed w/ Jamesetta So. Ok to order medication.Marland KitchenMarland KitchenGiven to [facility] to order. Sp w/ Dee. The injection has been ordered and the medication should arrive no later than Monday. Calling pt to schedule.Marland KitchenMarland KitchenDaine Gip  November 09, 2009 8:57 AM Follow-up by: Daine Gip,  November 09, 2009 8:57 AM  Additional Follow-up for Phone Call Additional follow up Details #1::        when ordered, set up appts - 1 week apart in 3 successive weeks, ie. [place], then [place], then mon. Hannah Beat MD  November 10, 2009 9:21 AM

## 2010-02-20 NOTE — Assessment & Plan Note (Signed)
Summary: RIGHT KNEE PAIN/CLE   Vital Signs:  Patient profile:   65 year old female Height:      60 inches Weight:      152.2 pounds BMI:     29.83 Temp:     97.9 degrees F oral Pulse rate:   72 / minute Pulse rhythm:   regular BP sitting:   120 / 72  (left arm) Cuff size:   regular  Vitals Entered By: Benny Lennert CMA Lu[facility]av[facility] Hamblin Dull) (September 15, 2009 11:56 AM)  History of Present Illness: Chief complaint Right knee pain  65 year old female:  Right knee is bothering her a lot.  Had a three shot injection series.   she had very good response to Synvisc injection series in the past, with approximate one-year history of relief of symptoms. She is status post arthroscopy on the RIGHT by Dr. Riley Lam At [facility], and he told her that she had severe degenerative joint disease. right now she has had a significant flare, RIGHT greater than LEFT. No effusions, but pain with daily activity and going up and downstairs.  Tomasita Crumble, xrays within the last year.   Review of systems: No fever, chills, sweats eating and drinking normally.  GEN: Well-developed,well-nourished,in no acute distress; alert,appropriate and cooperative throughout examination HEENT: Normocephalic and atraumatic without obvious abnormalities. No apparent alopecia or balding. Ears, externally no deformities PULM: Breathing comfortably in no respiratory distress EXT: No clubbing, cyanosis, or edema PSYCH: Normally interactive. Cooperative during the interview. Pleasant. Friendly and conversant. Not anxious or depressed appearing. Normal, full affect.   RIGHT knee: Full extension, flexion to 110. Minimal patellar motion. Patellar crepitus. Pain at the medial and lateral joint lines. Stable MCL and LCL. Negative Lachman and anterior and posterior drawer test. Flexion Pinch and McMurray's causes pain  Allergies: 1)  ! Sulfa   Impression & Recommendations:  Problem # 1:  OSTEOARTHRITIS, KNEE, RIGHT (ICD-715.96)  set  up for synvisc-3 series  Knee Injection Patient verbally consented to procedure. Risks, benefits, and alternatives explained. Sterilely prepped with betadine. Ethyl cholride used for anesthesia. 9 cc Lidocaine 1% mixed with 1 cc of Kenalog 40 mg injected using the anterolateral approach without difficulty. No complications with procedure and tolerated well. Patient had decreased pain post-injection.   The following medications were removed from the medication list:    Tramadol Hcl 50 Mg Tabs (Tramadol hcl) .Marland Kitchen... 1 tab by mouth daily as needed knee pain, limit use. Her updated medication list for this problem includes:    Aspirin 81 Mg Tbec (Aspirin) ..... One a day  Orders: Joint Aspirate / Injection, Large (20610) Kenalog 10mg  (4units) (J3301)  Complete Medication List: 1)  Hydrochlorothiazide 25 Mg Tabs (Hydrochlorothiazide) .... One a day 2)  Amlodipine Besylate 5 Mg Tabs (Amlodipine besylate) .... One a day 3)  Fluoxetine Hcl 20 Mg Caps (Fluoxetine hcl) .... One a day 4)  Nexium 40 Mg Cpdr (Esomeprazole magnesium) .Marland Kitchen.. 1 tab by mouth daily 5)  Aspirin 81 Mg Tbec (Aspirin) .... One a day 6)  Flonase 50 Mcg/act Susp (Fluticasone propionate) .... As needed 7)  Tenormin 100 Mg Tabs (Atenolol) .... One a day 8)  Claritin 10 Mg Tabs (Loratadine) .... One a day 9)  Calcium Plus Vitamin D 600-100 Mg-unit Caps (Calcium carbonate-vitamin d) .... One a day 10)  Glucosamine 500 Mg Caps (Glucosamine sulfate) .Marland Kitchen.. 1 a day 11)  Natural Fiber Therapy 30.9 % Powd (Psyllium) .Marland Kitchen.. 1 a day  Patient Instructions: 1)  SET  UP FOR SYNVISC-3 SERIES  Current Allergies (reviewed today): ! SULFA

## 2010-02-20 NOTE — Medication Information (Signed)
Summary: Physician Clarification for Rx/[facility]  Physician Clarification for Rx/[facility]   Imported By: Lanelle Bal 05/16/2009 10:58:28  _____________________________________________________________________  External Attachment:    Type:   Image     Comment:   External Document

## 2010-02-20 NOTE — Assessment & Plan Note (Signed)
Summary: CPX/DLO   Vital Signs:  Patient profile:   65 year old female Height:      60 inches Weight:      152.6 pounds BMI:     29.91 Temp:     98.2 degrees F oral Pulse rate:   64 / minute Pulse rhythm:   regular BP sitting:   120 / 66  (left arm) Cuff size:   regular  Vitals Entered By: Benny Lennert CMA Duncan Dull) (February 03, 2009 11:25 AM)  History of Present Illness: Chief complaint cpx  Here for welcome to medicare physical.   In last few months..pain in right knee..has history of arthritis, s/p arthroscopy. Has treated with PT, Synvisc helped temporarily. Saw Ortho 4 months back.  worse in winter..interested in emd for pain control. Use tylenol for pain.Marland Kitchendoesn't help much. HAs history of an ulcer.   Hypertension History:      She denies chest pain, dyspnea with exertion, peripheral edema, neurologic problems, and side effects from treatment.  well controlle dat home. Marland Kitchen        Positive major cardiovascular risk factors include female age 22 years old or older, hyperlipidemia, and hypertension.  Negative major cardiovascular risk factors include non-tobacco-user status.     Problems Prior to Update: 1)  Sinusitis- Acute-nos  (ICD-461.9) 2)  Essential Hypertension, Benign  (ICD-401.1) 3)  Ankle Pain, Left  (ICD-719.47) 4)  Dysphagia Unspecified  (ICD-787.20) 5)  Diverticulosis of Colon  (ICD-562.10) 6)  Osteoarthritis, Knee, Right  (ICD-715.96) 7)  Hiatal Hernia With Reflux  (ICD-553.3) 8)  Family History Breast Cancer 1st Degree Relative <50  (ICD-V16.3) 9)  Murmur  (ICD-785.2) 10)  Allergic Rhinitis Due To Pollen  (ICD-477.0) 11)  Gastric Ulcer, Hx of  (ICD-V12.71) 12)  Hx of Common Migraine  (ICD-346.10) 13)  Chickenpox, Hx of  (ICD-V15.9) 14)  Arthritis  (ICD-716.90)  Current Medications (verified): 1)  Hydrochlorothiazide 25 Mg Tabs (Hydrochlorothiazide) .... One A Day 2)  Amlodipine Besylate 5 Mg Tabs (Amlodipine Besylate) .... One A Day 3)  Fluoxetine  Hcl 20 Mg Caps (Fluoxetine Hcl) .... One A Day 4)  Nexium 40 Mg Cpdr (Esomeprazole Magnesium) .Marland Kitchen.. 1 Tab By Mouth Daily 5)  Aspirin 81 Mg Tbec (Aspirin) .... One A Day 6)  Flonase 50 Mcg/act Susp (Fluticasone Propionate) .... As Needed 7)  Tenormin 100 Mg Tabs (Atenolol) .... One A Day 8)  Claritin 10 Mg Tabs (Loratadine) .... One A Day 9)  Calcium Plus Vitamin D 600-100 Mg-Unit Caps (Calcium Carbonate-Vitamin D) .... One A Day 10)  Glucosamine 500 Mg Caps (Glucosamine Sulfate) .Marland Kitchen.. 1 A Day 11)  Natural Fiber Therapy 30.9 % Powd (Psyllium) .Marland Kitchen.. 1 A Day 12)  Amoxicillin 500 Mg Caps (Amoxicillin) .... Take 2 Caps Two Times A Day For 7days 13)  Tramadol Hcl 50 Mg Tabs (Tramadol Hcl) .Marland Kitchen.. 1 Tab By Mouth Daily As Needed Knee Pain, Limit Use.  Allergies: 1)  ! Sulfa  Past History:  Past medical, surgical, family and social histories (including risk factors) reviewed, and no changes noted (except as noted below).  Past Medical History: Reviewed history from 11/08/2008 and no changes required. arthritis Chronic headaches Diverticulosis Hypertension  Past Surgical History: cholecystectomy 1987 Hysterectomy, patrial..one ovary remains  Family History: Reviewed history from 11/08/2008 and no changes required. no colon cancer or esophagus cancer  Social History: Reviewed history from 11/29/2008 and no changes required. drinks sweet tea, diet sodas 4 a day married, 2 children, retired, nonsmoker, does not drink alcohol.  occupation--retired CNA  Review of Systems General:  Denies fatigue and fever. GI:  Denies abdominal pain and bloody stools. Derm:  Denies lesion(s). Psych:  Denies anxiety and depression.  Physical Exam  General:  Well-developed,well-nourished,in no acute distress; alert,appropriate and cooperative throughout examination Eyes:  No corneal or conjunctival inflammation noted. EOMI. Perrla. Funduscopic exam benign, without hemorrhages, exudates or papilledema. Vision  grossly normal. Ears:  External ear exam shows no significant lesions or deformities.  Otoscopic examination reveals clear canals, tympanic membranes are intact bilaterally without bulging, retraction, inflammation or discharge. Hearing is grossly normal bilaterally. Nose:  External nasal examination shows no deformity or inflammation. Nasal mucosa are pink and moist without lesions or exudates. Mouth:  Oral mucosa and oropharynx without lesions or exudates.  Teeth in good repair. Genitalia:  normal introitus, no vaginal atrophy, and no adnexal masses or tenderness.   Msk:  right knee : no swelling no redenss, pain jpoint line med and lateral, neg McMurray's Pulses:  R and L posterior tibial pulses are full and equal bilaterally  Neurologic:  No cranial nerve deficits noted. Station and gait are normal. Plantar reflexes are down-going bilaterally. DTRs are symmetrical throughout. Sensory, motor and coordinative functions appear intact.   Impression & Recommendations:  Problem # 1:  Preventive Health Care (ICD-V70.0) Reviewed preventive care protocols, scheduled due services, and updated immunizations. Encouraged exercise, weight loss, healthy eating habits.  HAs one ovary DVE needed.   Problem # 2:  PREDIABETES (ICD-790.29)  Discussed prediabetes and diet changes.   Labs Reviewed: Creat: 0.7 (01/26/2009)     Problem # 3:  ESSENTIAL HYPERTENSION, BENIGN (ICD-401.1) Well controlle don current meds.  Her updated medication list for this problem includes:    Hydrochlorothiazide 25 Mg Tabs (Hydrochlorothiazide) ..... One a day    Amlodipine Besylate 5 Mg Tabs (Amlodipine besylate) ..... One a day    Tenormin 100 Mg Tabs (Atenolol) ..... One a day  BP today: 120/66 Prior BP: 128/78 (11/29/2008)  Labs Reviewed: K+: 3.9 (01/26/2009) Creat: : 0.7 (01/26/2009)   Chol: 174 (01/26/2009)   HDL: 52.60 (01/26/2009)   LDL: 79 (01/26/2009)   TG: 227.0 (01/26/2009)  Problem # 4:   HYPERTRIGLYCERIDEMIA (ICD-272.1)  Start fish oil...recheck in 3 months. Encouraged exercise, weight loss, healthy eating habits.   Labs Reviewed: SGOT: 22 (01/26/2009)   SGPT: 16 (01/26/2009)   HDL:52.60 (01/26/2009)  LDL:79 (01/26/2009)  Chol:174 (01/26/2009)  Trig:227.0 (01/26/2009)  Problem # 5:  OSTEOARTHRITIS, KNEE, RIGHT (ICD-715.96) Tramadol as needed pain. Stop if stomach upset given ulcer histiry. Continue PPI.  Her updated medication list for this problem includes:    Aspirin 81 Mg Tbec (Aspirin) ..... One a day    Tramadol Hcl 50 Mg Tabs (Tramadol hcl) .Marland Kitchen... 1 tab by mouth daily as needed knee pain, limit use.  Complete Medication List: 1)  Hydrochlorothiazide 25 Mg Tabs (Hydrochlorothiazide) .... One a day 2)  Amlodipine Besylate 5 Mg Tabs (Amlodipine besylate) .... One a day 3)  Fluoxetine Hcl 20 Mg Caps (Fluoxetine hcl) .... One a day 4)  Nexium 40 Mg Cpdr (Esomeprazole magnesium) .Marland Kitchen.. 1 tab by mouth daily 5)  Aspirin 81 Mg Tbec (Aspirin) .... One a day 6)  Flonase 50 Mcg/act Susp (Fluticasone propionate) .... As needed 7)  Tenormin 100 Mg Tabs (Atenolol) .... One a day 8)  Claritin 10 Mg Tabs (Loratadine) .... One a day 9)  Calcium Plus Vitamin D 600-100 Mg-unit Caps (Calcium carbonate-vitamin d) .... One a day 10)  Glucosamine 500  Mg Caps (Glucosamine sulfate) .Marland Kitchen.. 1 a day 11)  Natural Fiber Therapy 30.9 % Powd (Psyllium) .Marland Kitchen.. 1 a day 12)  Amoxicillin 500 Mg Caps (Amoxicillin) .... Take 2 caps two times a day for 7days 13)  Tramadol Hcl 50 Mg Tabs (Tramadol hcl) .Marland Kitchen.. 1 tab by mouth daily as needed knee pain, limit use.  Other Orders: Pelvic & Breast Exam ( Medicare)  [phone]) Radiology Referral (Radiology)  Hypertension Assessment/Plan:      The patient's hypertensive risk group is category B: At least one risk factor (excluding diabetes) with no target organ damage.  Her calculated 10 year risk of coronary heart disease is 7 %.  Today's blood pressure is 120/66.  Her  blood pressure goal is < 140/90.  Patient Instructions: 1)  Fish oil 2000-4000 mg daily. 2)  Recheck fasting lipids, CMET, A1C  in 3months Dx 272.0    3)  Referral Appointment Information 4)  Day/Date: 5)  Time: 6)  Place/MD: 7)  Address: 8)  Phone/Fax: 9)  Patient given appointment information. Information/Orders faxed/mailed.  10)  Call insurance about shingles vaccine coverage.  Prescriptions: TRAMADOL HCL 50 MG TABS (TRAMADOL HCL) 1 tab by mouth daily as needed knee pain, limit use.  #30 x 0   Entered and Authorized by:   Kerby Nora MD   Signed by:   Kerby Nora MD on 02/03/2009   Method used:   Print then Give to Patient   RxID:   [id number]   Current Allergies (reviewed today): ! SULFA  Herpes Zoster Next Due:  Refused LDL Result Date:  01/26/2009 LDL Result:  79 LDL Next Due:  6 mo DVE needed to palpate ovary.

## 2010-02-20 NOTE — Progress Notes (Signed)
Summary: refill request for tramadol  Phone Note Refill Request Message from:  Patient  Refills Requested: Medication #1:  TRAMADOL HCL 50 MG TABS 1 tab by mouth daily as needed knee pain [facility] to [facility].  Initial call taken by: Lowella Petties CMA,  May 08, 2009 8:25 AM  Follow-up for Phone Call        Rx called to pharmacy Follow-up by: Benny Lennert CMA Duncan Dull),  May 08, 2009 4:12 PM    Prescriptions: TRAMADOL HCL 50 MG TABS (TRAMADOL HCL) 1 tab by mouth daily as needed knee pain, limit use.  #30 x 0   Entered and Authorized by:   Kerby Nora MD   Signed by:   Kerby Nora MD on 05/08/2009   Method used:   Electronically to        [facility] [phone]* (retail)       [address]       [place], [place]  [address]       Ph: [phone]       Fax: [phone]   RxID:   [id number]

## 2010-02-20 NOTE — Assessment & Plan Note (Signed)
Summary: tb test  Nurse Visit   Allergies: 1)  ! Sulfa  Immunizations Administered:  PPD Skin Test:    Vaccine Type: PPD    Site: left forearm    Mfr: Sanofi Pasteur    Dose: 0.1 ml    Route: ID    Given by: Delilah Shan CMA (AAMA)    Exp. Date: 11/03/2010    Lot #: [id number]  Orders Added: 1)  TB Skin Test [86580] 2)  Admin 1st Vaccine [90471] 3)  Est. Patient Level I [03474]  Appended Document: tb test   PPD Results    Date of reading: 07/03/2009    Results: < 5mm    Interpretation: negative

## 2010-02-20 NOTE — Progress Notes (Signed)
Summary: scripts need doctor's signature  Phone Note From Pharmacy   Caller: [facility] Summary of Call: Scripts were sent [facility] to [facility] for several meds but Herbert Seta signed them.  They need Dr. Ermalene Searing to verify the scripts and they need these back within 2 days.  Since she is out I put your name on the form.  Form is on your desk. Initial call taken by: Lowella Petties CMA,  May 10, 2009 9:28 AM

## 2010-02-20 NOTE — Assessment & Plan Note (Signed)
Summary: SYNVISC  INJECTION  CYD   Vital Signs:  Patient profile:   65 year old female Height:      60 inches Weight:      149.0 pounds BMI:     29.20 Temp:     98.1 degrees F oral Pulse rate:   76 / minute Pulse rhythm:   regular BP sitting:   120 / 80  (left arm) Cuff size:   regular  Vitals Entered By: Benny Lennert CMA Duncan Dull) (November 30, 2009 10:52 AM)  History of Present Illness: Chief complaint synvisc injection    Allergies: 1)  ! Sulfa   Impression & Recommendations:  Problem # 1:  OSTEOARTHRITIS, KNEE, RIGHT (ICD-715.96)  Knee Injection: Synvisc-3, RIGHT, # 3 of series Patient verbally consented to procedure. Risks, benefits, and alternatives explained. Sterilely prepped with betadine. Ethyl cholride used for anesthesia, then 5 cc of Lidocaine 1% used for anesthesia in the anterolateral position. Reprepped with Betadine.  Anterolateral approach without difficulty, injected with Synvisc-3, 2 mL. (16 mg) No complications with procedure and tolerated well.   Her updated medication list for this problem includes:    Aspirin 81 Mg Tbec (Aspirin) ..... One a day    Tramadol Hcl 50 Mg Tabs (Tramadol hcl) ..... One tablet by mouth every day as needed for knee pain  Orders: Joint Aspirate / Injection, Large (20610) Synvisc-Three (16 units) (Z6109)  Orders: Joint Aspirate / Injection, Large (60454) Synvisc-Three (16 units) (U9811)  Complete Medication List: 1)  Hydrochlorothiazide 25 Mg Tabs (Hydrochlorothiazide) .... One a day 2)  Amlodipine Besylate 5 Mg Tabs (Amlodipine besylate) .... One a day 3)  Fluoxetine Hcl 20 Mg Caps (Fluoxetine hcl) .... One a day 4)  Nexium 40 Mg Cpdr (Esomeprazole magnesium) .Marland Kitchen.. 1 tab by mouth daily 5)  Aspirin 81 Mg Tbec (Aspirin) .... One a day 6)  Flonase 50 Mcg/act Susp (Fluticasone propionate) .... As needed 7)  Tenormin 100 Mg Tabs (Atenolol) .... One a day 8)  Claritin 10 Mg Tabs (Loratadine) .... One a day 9)  Calcium  Plus Vitamin D 600-100 Mg-unit Caps (Calcium carbonate-vitamin d) .... One a day 10)  Glucosamine 500 Mg Caps (Glucosamine sulfate) .Marland Kitchen.. 1 a day 11)  Natural Fiber Therapy 30.9 % Powd (Psyllium) .Marland Kitchen.. 1 a day 12)  Tramadol Hcl 50 Mg Tabs (Tramadol hcl) .... One tablet by mouth every day as needed for knee pain   Orders Added: 1)  Joint Aspirate / Injection, Large [20610] 2)  Synvisc-Three (16 units) [J7325]    Current Allergies (reviewed today): ! SULFA

## 2010-02-20 NOTE — Assessment & Plan Note (Signed)
Summary: diabetes/hmw   Vital Signs:  Patient profile:   65 year old female Height:      60 inches Weight:      154.8 pounds BMI:     30.34 Temp:     98.5 degrees F oral Pulse rate:   68 / minute Pulse rhythm:   regular BP sitting:   110 / 70  (left arm) Cuff size:   regular  Vitals Entered By: Benny Lennert CMA Duncan Dull) (July 26, 2009 8:37 AM)  History of Present Illness: Chief complaint diabetes  6 month OV for the following chronic issues:   DM, well controlled  with diet.  Check CBGs twice a day..more often if feeling bad.  FBS 94-100, 2 hour post prandial...93-163  High cholesterol, improved from last check...almost at goal..with diet  BP well controlled on multiple meds.   Osteoarthritis...in right knee... Sees ORTHo...planning synvisc injection. Had 3 month improvement improvement with cortisone.  Using tramadol...every few days...needs refill. Also using glucosamine.   Swimming pool exercsie daily  now.    Fatigue improved. Working on weight loss.  Problems Prior to Update: 1)  Screening, Pulmonary Tuberculosis  (ICD-V74.1) 2)  Other Screening Mammogram  (ICD-V76.12) 3)  Routine Gynecological Examination  (ICD-V72.31) 4)  Preventive Health Care  (ICD-V70.0) 5)  Hypertriglyceridemia  (ICD-272.1) 6)  Diabetes Mellitus, Borderline  (ICD-790.29) 7)  Sinusitis- Acute-nos  (ICD-461.9) 8)  Essential Hypertension, Benign  (ICD-401.1) 9)  Ankle Pain, Left  (ICD-719.47) 10)  Dysphagia Unspecified  (ICD-787.20) 11)  Diverticulosis of Colon  (ICD-562.10) 12)  Osteoarthritis, Knee, Right  (ICD-715.96) 13)  Hiatal Hernia With Reflux  (ICD-553.3) 14)  Family History Breast Cancer 1st Degree Relative <50  (ICD-V16.3) 15)  Murmur  (ICD-785.2) 16)  Allergic Rhinitis Due To Pollen  (ICD-477.0) 17)  Gastric Ulcer, Hx of  (ICD-V12.71) 18)  Hx of Common Migraine  (ICD-346.10) 19)  Chickenpox, Hx of  (ICD-V15.9) 20)  Arthritis  (ICD-716.90)  Current Medications  (verified): 1)  Hydrochlorothiazide 25 Mg Tabs (Hydrochlorothiazide) .... One A Day 2)  Amlodipine Besylate 5 Mg Tabs (Amlodipine Besylate) .... One A Day 3)  Fluoxetine Hcl 20 Mg Caps (Fluoxetine Hcl) .... One A Day 4)  Nexium 40 Mg Cpdr (Esomeprazole Magnesium) .Marland Kitchen.. 1 Tab By Mouth Daily 5)  Aspirin 81 Mg Tbec (Aspirin) .... One A Day 6)  Flonase 50 Mcg/act Susp (Fluticasone Propionate) .... As Needed 7)  Tenormin 100 Mg Tabs (Atenolol) .... One A Day 8)  Claritin 10 Mg Tabs (Loratadine) .... One A Day 9)  Calcium Plus Vitamin D 600-100 Mg-Unit Caps (Calcium Carbonate-Vitamin D) .... One A Day 10)  Glucosamine 500 Mg Caps (Glucosamine Sulfate) .Marland Kitchen.. 1 A Day 11)  Natural Fiber Therapy 30.9 % Powd (Psyllium) .Marland Kitchen.. 1 A Day 12)  Amoxicillin 500 Mg Caps (Amoxicillin) .... Take 2 Caps Two Times A Day For 7days 13)  Tramadol Hcl 50 Mg Tabs (Tramadol Hcl) .Marland Kitchen.. 1 Tab By Mouth Daily As Needed Knee Pain, Limit Use.  Allergies: 1)  ! Sulfa  Past History:  Past medical, surgical, family and social histories (including risk factors) reviewed, and no changes noted (except as noted below).  Past Medical History: Reviewed history from 11/08/2008 and no changes required. arthritis Chronic headaches Diverticulosis Hypertension  Past Surgical History: Reviewed history from 02/03/2009 and no changes required. cholecystectomy 1987 Hysterectomy, patrial..one ovary remains  Family History: Reviewed history from 11/08/2008 and no changes required. no colon cancer or esophagus cancer  Social History: Reviewed history  from 11/29/2008 and no changes required. drinks sweet tea, diet sodas 4 a day married, 2 children, retired, nonsmoker, does not drink alcohol. occupation--retired CNA  Review of Systems General:  Denies fatigue and fever. CV:  Denies chest pain or discomfort. Resp:  Denies shortness of breath. GI:  Denies abdominal pain. GU:  Denies dysuria.  Physical Exam  General:   Well-developed,well-nourished,in no acute distress; alert,appropriate and cooperative throughout examination Mouth:  Oral mucosa and oropharynx without lesions or exudates.  Teeth in good repair. Neck:  no carotid bruit or thyromegaly no cervical or supraclavicular lymphadenopathy  Lungs:  Normal respiratory effort, chest expands symmetrically. Lungs are clear to auscultation, no crackles or wheezes. Heart:  Normal rate and regular rhythm. S1 and S2 normal without gallop, murmur, click, rub or other extra sounds. Abdomen:  Bowel sounds positive,abdomen soft and non-tender without masses, organomegaly or hernias noted. Pulses:  R and L posterior tibial pulses are full and equal bilaterally  Extremities:  no edema, B varicosities  Diabetes Management Exam:    Foot Exam (with socks and/or shoes not present):       Sensory-Pinprick/Light touch:          Left medial foot (L-4): normal          Left dorsal foot (L-5): normal          Left lateral foot (S-1): normal          Right medial foot (L-4): normal          Right dorsal foot (L-5): normal          Right lateral foot (S-1): normal       Sensory-Monofilament:          Left foot: normal          Right foot: normal       Inspection:          Left foot: normal          Right foot: normal       Nails:          Left foot: normal          Right foot: normal    Eye Exam:       Eye Exam done elsewhere   Impression & Recommendations:  Problem # 1:  DIABETES MELLITUS, BORDERLINE (ICD-790.29) Well controlled with lifestyle change.Encouraged exercise, weight loss, healthy eating habits.   Problem # 2:  HYPERTRIGLYCERIDEMIA (ICD-272.1) Improved control.Marland KitchenMarland KitchenLDL now at goal.   Problem # 3:  ESSENTIAL HYPERTENSION, BENIGN (ICD-401.1)  Well controlled. Continue current medication.  Her updated medication list for this problem includes:    Hydrochlorothiazide 25 Mg Tabs (Hydrochlorothiazide) ..... One a day    Amlodipine Besylate 5 Mg Tabs  (Amlodipine besylate) ..... One a day    Tenormin 100 Mg Tabs (Atenolol) ..... One a day  BP today: 110/70 Prior BP: 120/66 (02/03/2009)  Prior 10 Yr Risk Heart Disease: 7 % (02/03/2009)  Labs Reviewed: K+: 4.1 (07/21/2009) Creat: : 0.6 (07/21/2009)   Chol: 201 (07/21/2009)   HDL: 67.90 (07/21/2009)   LDL: 79 (01/26/2009)   TG: 119.0 (07/21/2009)  Problem # 4:  ARTHRITIS (ICD-716.90) Per ORHTO..planning synvisc. Reiflled tramadol.   Complete Medication List: 1)  Hydrochlorothiazide 25 Mg Tabs (Hydrochlorothiazide) .... One a day 2)  Amlodipine Besylate 5 Mg Tabs (Amlodipine besylate) .... One a day 3)  Fluoxetine Hcl 20 Mg Caps (Fluoxetine hcl) .... One a day 4)  Nexium 40 Mg Cpdr (Esomeprazole magnesium) .Marland KitchenMarland KitchenMarland Kitchen  1 tab by mouth daily 5)  Aspirin 81 Mg Tbec (Aspirin) .... One a day 6)  Flonase 50 Mcg/act Susp (Fluticasone propionate) .... As needed 7)  Tenormin 100 Mg Tabs (Atenolol) .... One a day 8)  Claritin 10 Mg Tabs (Loratadine) .... One a day 9)  Calcium Plus Vitamin D 600-100 Mg-unit Caps (Calcium carbonate-vitamin d) .... One a day 10)  Glucosamine 500 Mg Caps (Glucosamine sulfate) .Marland Kitchen.. 1 a day 11)  Natural Fiber Therapy 30.9 % Powd (Psyllium) .Marland Kitchen.. 1 a day 12)  Amoxicillin 500 Mg Caps (Amoxicillin) .... Take 2 caps two times a day for 7days 13)  Tramadol Hcl 50 Mg Tabs (Tramadol hcl) .Marland Kitchen.. 1 tab by mouth daily as needed knee pain, limit use.   Patient Instructions: 1)  Scheduled CPX in 6 months. 2)   FAsting labs prior CMET, LIPIDS, A1C, microalbumin 250.00 3)   Please schedule DM eye exam.  Prescriptions: TRAMADOL HCL 50 MG TABS (TRAMADOL HCL) 1 tab by mouth daily as needed knee pain, limit use.  #30 x 0   Entered and Authorized by:   Kerby Nora MD   Signed by:   Kerby Nora MD on 07/26/2009   Method used:   Print then Give to Patient   RxID:   [id number]   Current Allergies (reviewed today): ! SULFA

## 2010-02-20 NOTE — Progress Notes (Signed)
Summary: Marine scientist Form   Phone Note Other Incoming Call back at [employer] [phone]   Caller: Daine Gip  Summary of Call: We received the information back from [employer]: Called pt request that she contact her ins co., to clarify how much she would be responsible for based on the cpt code given to her. Pt says she will be back in contact w/ on the first of the week...fyi to Dr. Patsy Lager.Daine Gip  September 29, 2009 10:13 AM    Initial call taken by: Daine Gip,  September 29, 2009 10:13 AM  Follow-up for Phone Call        ok [employer] usually pays 100% Follow-up by: Hannah Beat MD,  September 29, 2009 10:23 AM

## 2010-02-20 NOTE — Progress Notes (Signed)
Summary: Prior Authorization for Nexium [facility])  Phone Note From Pharmacy Call back at [phone]   Caller: [employer] Call For: Dr. Ermalene Searing  Summary of Call: Received prior authorization form for Nexium 40mg  packet.  Please advise.  Form in your IN box Initial call taken by: Linde Gillis CMA Duncan Dull),  January 30, 2009 2:56 PM  Follow-up for Phone Call        Please call...should be Nexium TAB...not packet. Please see if they will cover this without prior auth (I believe they will) and send in new rx to pharm.  Follow-up by: Kerby Nora MD,  January 30, 2009 4:05 PM  Additional Follow-up for Phone Call Additional follow up Details #1::        Spoke with Ebony at [facility], no prior auth needed for Nexium 40 tablet.  [facility]. Additional Follow-up by: Linde Gillis CMA (AAMA),  January 30, 2009 5:00 PM     Appended Document: Orders Update    Clinical Lists Changes  Medications: Changed medication from NEXIUM 40 MG PACK (ESOMEPRAZOLE MAGNESIUM) 1 tab by mouth daily to NEXIUM 40 MG CPDR (ESOMEPRAZOLE MAGNESIUM) 1 tab by mouth daily - Signed Rx of NEXIUM 40 MG CPDR (ESOMEPRAZOLE MAGNESIUM) 1 tab by mouth daily;  #30 x 4;  Signed;  Entered by: Kerby Nora MD;  Authorized by: Kerby Nora MD;  Method used: [id number] [address] [place], [place], [place]  [address], Ph: [phone], Fax: [phone]    Prescriptions: NEXIUM 40 MG CPDR (ESOMEPRAZOLE MAGNESIUM) 1 tab by mouth daily  #30 x 4   Entered and Authorized by:   Kerby Nora MD   Signed by:   Kerby Nora MD on 01/31/2009   Method used:   Telephoned to ...       [facility] [phone]* (retail)       [address]       [place], [place]  [address]       Ph: [phone]       Fax: [phone]   RxID:   [id number]

## 2010-02-22 NOTE — Progress Notes (Signed)
Summary: refill request for gabapentin  Phone Note Refill Request Call back at [phone] Message from:  Patient  Refills Requested: Medication #1:  GABAPENTIN 100 MG CAPS 1 tab by mouth at bedtime for peripheral neuropathy Phoned request from pt, she usually has to take 2 of these at night.  [facility] [place]  Initial call taken by: Lowella Petties CMA, AAMA,  February 07, 2010 8:58 AM    New/Updated Medications: GABAPENTIN 100 MG CAPS (GABAPENTIN) 2 tab by mouth at bedtime for peripheral neuropathy Prescriptions: GABAPENTIN 100 MG CAPS (GABAPENTIN) 2 tab by mouth at bedtime for peripheral neuropathy  #60 x 5   Entered and Authorized by:   Kerby Nora MD   Signed by:   Kerby Nora MD on 02/07/2010   Method used:   [facility]ronically to        [facility] [phone][facility] (retail)       [address]       [place], [place]  [address]       Ph: [phone]       Fax: [phone]   RxID:   [id number]

## 2010-03-14 NOTE — Letter (Signed)
Summary: [facility] Patient Records  [facility] Patient Records   Imported By: Kassie Mends 03/05/2010 11:12:39  _____________________________________________________________________  External Attachment:    Type:   Image     Comment:   External Document

## 2010-03-23 ENCOUNTER — Other Ambulatory Visit (INDEPENDENT_AMBULATORY_CARE_PROVIDER_SITE_OTHER): Payer: Medicare PPO

## 2010-03-23 ENCOUNTER — Other Ambulatory Visit: Payer: Self-pay | Admitting: Family Medicine

## 2010-03-23 ENCOUNTER — Encounter (INDEPENDENT_AMBULATORY_CARE_PROVIDER_SITE_OTHER): Payer: Self-pay | Admitting: *Deleted

## 2010-03-23 ENCOUNTER — Telehealth (INDEPENDENT_AMBULATORY_CARE_PROVIDER_SITE_OTHER): Payer: Self-pay | Admitting: *Deleted

## 2010-03-23 DIAGNOSIS — E119 Type 2 diabetes mellitus without complications: Secondary | ICD-10-CM

## 2010-03-23 DIAGNOSIS — R5383 Other fatigue: Secondary | ICD-10-CM

## 2010-03-23 DIAGNOSIS — R5381 Other malaise: Secondary | ICD-10-CM | POA: Insufficient documentation

## 2010-03-23 LAB — TSH: TSH: 1.8 u[IU]/mL (ref 0.35–5.50)

## 2010-03-23 LAB — HEMOGLOBIN A1C: Hgb A1c MFr Bld: 6.1 % (ref 4.6–6.5)

## 2010-03-23 LAB — VITAMIN B12: Vitamin B-12: 257 pg/mL (ref 211–911)

## 2010-03-26 ENCOUNTER — Other Ambulatory Visit: Payer: Self-pay

## 2010-03-29 NOTE — Progress Notes (Signed)
----   Converted from flag ---- ---- 03/20/2010 1:58 PM, Kerby Nora MD wrote: HgBA1c  ICD-9: 250.00   TSH, B12 Dx 780.79  ---- 03/20/2010 1:18 PM, Liane Comber CMA (AAMA) wrote: Lab orders please! Good Morning! This pt is scheduled for cpx labs Monday, which labs to draw and dx codes to use? Thanks Tasha ------------------------------

## 2010-04-02 ENCOUNTER — Encounter (INDEPENDENT_AMBULATORY_CARE_PROVIDER_SITE_OTHER): Payer: Medicare PPO | Admitting: Family Medicine

## 2010-04-02 ENCOUNTER — Encounter: Payer: Self-pay | Admitting: Family Medicine

## 2010-04-02 DIAGNOSIS — Z0000 Encounter for general adult medical examination without abnormal findings: Secondary | ICD-10-CM

## 2010-04-02 LAB — CONVERTED CEMR LAB

## 2010-04-10 NOTE — Letter (Signed)
Summary: Nature conservation officer [employer] Wellness Visit Questionnaire   [employer] Annual Wellness Visit Questionnaire   Imported By: Beau Fanny 04/03/2010 14:00:30  _____________________________________________________________________  External Attachment:    Type:   Image     Comment:   External Document

## 2010-04-10 NOTE — Assessment & Plan Note (Signed)
Summary: CPX/JRR   Vital Signs:  Patient profile:   65 year old female Height:      60 inches Weight:      151.75 pounds BMI:     29.74 Temp:     98.2 degrees F oral Pulse rate:   72 / minute Pulse rhythm:   regular BP sitting:   120 / 72  (left arm)  Vitals Entered By: Benny Lennert CMA Duncan Dull) (April 02, 2010 11:22 AM)  Vision Screening:      Vision Comments: Patient wears reading glasses  Vision Entered By: Benny Lennert CMA (AAMA) (April 02, 2010 11:22 AM) 40db HL: Left  Right  Audiometry Comment: Patient wears hearing aids      Last PAP Date 04/02/2010 Last PAP Result DVE no pap   History of Present Illness: Chief complaint annual wellness visit  I have personally reviewed the Medicare Annual Wellness questionnaire and have noted 1.   The patient's medical and social history 2.   Their use of alcohol, tobacco or illicit drugs 3.   Their current medications and supplements 4.   The patient's functional ability including ADL's, fall risks, home safety risks and hearing or visual             impairment. 5.   Diet and physical activities 6.   Evidence for depression or mood disorders The patients weight, height, BMI and visual acuity have been recorded in the chart I have made referrals, counseling and provided education to the patient based review of the above and I have provided the pt with a written personalized care plan for preventive services.   DM, well controlled FBS 95, 2pp 145 Diabetic peripheral neuropathy... stable on gababpentin 1-2 a day.   HTN, well controlled HCTZ, amlodipine, tenormin  High cholesterol at goal when checked last in 11/2009.  Right knee pain, chronic due to arthritis. Using tramadol one a day.  No swelling. Has been going to water classes. ORTHO: at [facility].  Preventive Screening-Counseling & Management  Alcohol-Tobacco     Alcohol drinks/day: 0     Smoking Status: never  Caffeine-Diet-Exercise     Diet  Comments: MODERATE     Diet Counseling: to improve diet; diet is suboptimal     Does Patient Exercise: no     Exercise Counseling: to improve exercise regimen  Problems Prior to Update: 1)  Other Malaise and Fatigue  (ICD-780.79) 2)  Diabetic Peripheral Neuropathy  (ICD-250.60) 3)  Osteoarthritis, Knee, Right  (ICD-715.96) 4)  Screening, Pulmonary Tuberculosis  (ICD-V74.1) 5)  Other Screening Mammogram  (ICD-V76.12) 6)  Routine Gynecological Examination  (ICD-V72.31) 7)  Preventive Health Care  (ICD-V70.0) 8)  Hypertriglyceridemia  (ICD-272.1) 9)  Diabetes Mellitus, Type II  (ICD-250.00) 10)  Essential Hypertension, Benign  (ICD-401.1) 11)  Diverticulosis of Colon  (ICD-562.10) 12)  Hiatal Hernia With Reflux  (ICD-553.3) 13)  Family History Breast Cancer 1st Degree Relative <50  (ICD-V16.3) 14)  Murmur  (ICD-785.2) 15)  Allergic Rhinitis Due To Pollen  (ICD-477.0) 16)  Gastric Ulcer, Hx of  (ICD-V12.71) 17)  Hx of Common Migraine  (ICD-346.10)  Current Medications (verified): 1)  Hydrochlorothiazide 25 Mg Tabs (Hydrochlorothiazide) .... One A Day 2)  Amlodipine Besylate 5 Mg Tabs (Amlodipine Besylate) .... One A Day 3)  Fluoxetine Hcl 20 Mg Caps (Fluoxetine Hcl) .... One A Day 4)  Nexium 40 Mg Cpdr (Esomeprazole Magnesium) .Marland Kitchen.. 1 Tab By Mouth Daily 5)  Aspirin 81 Mg Tbec (Aspirin) .... One A Day  6)  Flonase 50 Mcg/act Susp (Fluticasone Propionate) .... As Needed 7)  Tenormin 100 Mg Tabs (Atenolol) .... One A Day 8)  Claritin 10 Mg Tabs (Loratadine) .... One A Day 9)  Calcium Plus Vitamin D 600-100 Mg-Unit Caps (Calcium Carbonate-Vitamin D) .... One A Day 10)  Glucosamine 500 Mg Caps (Glucosamine Sulfate) .Marland Kitchen.. 1 A Day 11)  Natural Fiber Therapy 30.9 % Powd (Psyllium) .Marland Kitchen.. 1 A Day 12)  Tramadol Hcl 50 Mg Tabs (Tramadol Hcl) .... One Tablet By Mouth Every Day As Needed For Knee Pain 13)  Gabapentin 100 Mg Caps (Gabapentin) .... 2 Tab By Mouth At Bedtime For Peripheral  Neuropathy  Allergies: 1)  ! Sulfa  Past History:  Past medical, surgical, family and social histories (including risk factors) reviewed, and no changes noted (except as noted below).  Past Medical History: Reviewed history from 11/08/2008 and no changes required. arthritis Chronic headaches Diverticulosis Hypertension  Past Surgical History: Reviewed history from 02/03/2009 and no changes required. cholecystectomy 1987 Hysterectomy, patrial..one ovary remains  Family History: Reviewed history from 11/08/2008 and no changes required. no colon cancer or esophagus cancer  Social History: Reviewed history from 11/29/2008 and no changes required. drinks sweet tea, diet sodas 4 a day married, 2 children, retired, nonsmoker, does not drink alcohol. occupation--retired CNA  Review of Systems General:  Denies fatigue and fever. CV:  Denies chest pain or discomfort. Resp:  Denies shortness of breath, sputum productive, and wheezing. GI:  Denies abdominal pain, bloody stools, constipation, and diarrhea. GU:  Denies abnormal vaginal bleeding and dysuria.  Physical Exam  General:  Well-developed,well-nourished,in no acute distress; alert,appropriate and cooperative throughout examination Eyes:  No corneal or conjunctival inflammation noted. EOMI. Perrla. Funduscopic exam benign, without hemorrhages, exudates or papilledema. Vision grossly normal. Ears:  External ear exam shows no significant lesions or deformities.  Otoscopic examination reveals clear canals, tympanic membranes are intact bilaterally without bulging, retraction, inflammation or discharge. Hearing is grossly normal bilaterally. Nose:  External nasal examination shows no deformity or inflammation. Nasal mucosa are pink and moist without lesions or exudates. Mouth:  Oral mucosa and oropharynx without lesions or exudates.  Teeth in good repair. Neck:  no cervical or supraclavicular lymphadenopathy no carotid bruit or  thyromegaly  Chest Wall:  No deformities, masses, or tenderness noted. Breasts:  No mass, nodules, thickening, tenderness, bulging, retraction, inflamation, nipple discharge or skin changes noted.   Lungs:  Normal respiratory effort, chest expands symmetrically. Lungs are clear to auscultation, no crackles or wheezes. Heart:  Normal rate and regular rhythm. S1 and S2 normal without gallop, murmur, click, rub or other extra sounds. Abdomen:  Bowel sounds positive,abdomen soft and non-tender without masses, organomegaly or hernias noted. Genitalia:  normal introitus, no vaginal atrophy, and no adnexal masses or tenderness.   Msk:  right knee : no swelling no redenss, pain jpoint line med and lateral, neg McMurray's Pulses:  R and L posterior tibial pulses are full and equal bilaterally  Extremities:  no edema, B varicosities  Diabetes Management Exam:    Foot Exam (with socks and/or shoes not present):       Sensory-Pinprick/Light touch:          Left medial foot (L-4): absent          Left dorsal foot (L-5): absent          Left lateral foot (S-1): absent          Right medial foot (L-4): absent  Right dorsal foot (L-5): absent          Right lateral foot (S-1): absent       Sensory-Monofilament:          Left foot: absent          Right foot: absent       Inspection:          Left foot: normal          Right foot: normal       Nails:          Left foot: normal          Right foot: normal   Impression & Recommendations:  Problem # 1:  PREVENTIVE HEALTH CARE (ICD-V70.0)  The patient's preventative maintenance and recommended screening tests for an annual wellness exam were reviewed in full today. Brought up to date unless services declined.  Counselled on the importance of diet, exercise, and its role in overall health and mortality. The patient's FH and SH was reviewed, including their home life, tobacco status, and drug and alcohol status.     Orders: Medicare -1st  Annual Wellness Visit [phone])  Problem # 2:  DIABETES MELLITUS, TYPE II (ICD-250.00) Well controlled. Continue diet control. Encouraged exercise, weight loss, healthy eating habits.  Her updated medication list for this problem includes:    Aspirin 81 Mg Tbec (Aspirin) ..... One a day  Problem # 3:  DIABETIC PERIPHERAL NEUROPATHY (ICD-250.60) Stable on gabapentin.  Her updated medication list for this problem includes:    Aspirin 81 Mg Tbec (Aspirin) ..... One a day  Problem # 4:  ESSENTIAL HYPERTENSION, BENIGN (ICD-401.1) Well controlled. Continue current medication. Encouraged exercise, weight loss, healthy eating habits.  Her updated medication list for this problem includes:    Hydrochlorothiazide 25 Mg Tabs (Hydrochlorothiazide) ..... One a day    Amlodipine Besylate 5 Mg Tabs (Amlodipine besylate) ..... One a day    Tenormin 100 Mg Tabs (Atenolol) ..... One a day  Problem # 5:  ROUTINE GYNECOLOGICAL EXAMINATION (ICD-V72.31) DVE no pap.   Complete Medication List: 1)  Hydrochlorothiazide 25 Mg Tabs (Hydrochlorothiazide) .... One a day 2)  Amlodipine Besylate 5 Mg Tabs (Amlodipine besylate) .... One a day 3)  Fluoxetine Hcl 20 Mg Caps (Fluoxetine hcl) .... One a day 4)  Nexium 40 Mg Cpdr (Esomeprazole magnesium) .Marland Kitchen.. 1 tab by mouth daily 5)  Aspirin 81 Mg Tbec (Aspirin) .... One a day 6)  Flonase 50 Mcg/act Susp (Fluticasone propionate) .... As needed 7)  Tenormin 100 Mg Tabs (Atenolol) .... One a day 8)  Claritin 10 Mg Tabs (Loratadine) .... One a day 9)  Calcium Plus Vitamin D 600-100 Mg-unit Caps (Calcium carbonate-vitamin d) .... One a day 10)  Glucosamine 500 Mg Caps (Glucosamine sulfate) .Marland Kitchen.. 1 a day 11)  Natural Fiber Therapy 30.9 % Powd (Psyllium) .Marland Kitchen.. 1 a day 12)  Tramadol Hcl 50 Mg Tabs (Tramadol hcl) .... One tablet by mouth every day as needed for knee pain 13)  Gabapentin 100 Mg Caps (Gabapentin) .... 2 tab by mouth at bedtime for peripheral neuropathy  Other  Orders: Radiology Referral (Radiology)  Patient Instructions: 1)  I have provided you with a copy of your personalized plan for preventive services. Please take the time to review along with your updated medication list.  2)  Please schedule a follow-up appointment in 6 months DM 3)  BMP prior to visit, ICD-9:  250.00 4)  Hepatic Panel prior to visit ICD-9:  5)  Lipid panel prior to visit ICD-9 :  6)  HgBA1c prior to visit  ICD-9:  7)  Referral Appointment Information 8)  Day/Date: 9)  Time: 10)  Place/MD: 11)  Address: 12)  Phone/Fax: 13)  Patient given appointment information. Information/Orders faxed/mailed.  14)  See your eye doctor yearly to check for diabetic eye damage.   Orders Added: 1)  Radiology Referral [Radiology] 2)  Medicare -1st Annual Wellness Visit [[id number]]    Current Allergies (reviewed today): ! SULFA  Herpes Zoster Next Due:  Refused Last Colonoscopy:  normal (01/21/2001 11:23:07 AM) Colonoscopy Next Due:  10 yr PAP Result Date:  04/02/2010 PAP Result:  DVE no pap PAP Next Due:  1 yr      Past Medical History:    Reviewed history from 11/08/2008 and no changes required:       arthritis       Chronic headaches       Diverticulosis       Hypertension  Past Surgical History:    Reviewed history from 02/03/2009 and no changes required:       cholecystectomy 1987       Hysterectomy, patrial..one ovary remains

## 2010-04-25 ENCOUNTER — Ambulatory Visit: Payer: Self-pay | Admitting: Family Medicine

## 2010-05-02 ENCOUNTER — Ambulatory Visit: Payer: Self-pay | Admitting: Family Medicine

## 2010-05-10 ENCOUNTER — Encounter: Payer: Self-pay | Admitting: Family Medicine

## 2010-07-26 ENCOUNTER — Telehealth: Payer: Self-pay | Admitting: *Deleted

## 2010-07-26 NOTE — Telephone Encounter (Signed)
Form for diabetic supplies is on your desk.  I checked with the patient and she does get supplies from this company.

## 2010-08-27 ENCOUNTER — Encounter: Payer: Self-pay | Admitting: Family Medicine

## 2010-08-27 ENCOUNTER — Ambulatory Visit (INDEPENDENT_AMBULATORY_CARE_PROVIDER_SITE_OTHER): Payer: Medicare PPO | Admitting: Family Medicine

## 2010-08-27 DIAGNOSIS — R5381 Other malaise: Secondary | ICD-10-CM

## 2010-08-27 DIAGNOSIS — R5383 Other fatigue: Secondary | ICD-10-CM

## 2010-08-27 DIAGNOSIS — E1149 Type 2 diabetes mellitus with other diabetic neurological complication: Secondary | ICD-10-CM

## 2010-08-27 MED ORDER — GABAPENTIN 100 MG PO CAPS: ORAL_CAPSULE | ORAL | Status: DC

## 2010-08-27 MED ORDER — GABAPENTIN 100 MG PO CAPS: 100.0000 mg | ORAL_CAPSULE | Freq: Three times a day (TID) | ORAL | Status: DC

## 2010-08-27 NOTE — Progress Notes (Signed)
  Subjective:    Patient ID: Ashley Ryan, female    DOB: March 03, 1945, 65 y.o.   MRN: [id number]  HPI  Fatigue: Has some neuropathy - taking some gabapentin. Taking 400 mg po bid right now. Decreased energy. Sleeping some during the day. Was on 100 mg po bid.  Does not like the feeling.  Fatigue sensations started around the time of increased medication Sleeping during the day  The PMH, PSH, Social History, Family History, Medications, and allergies have been reviewed in [facility], and have been updated if relevant.   Review of Systems ROS: GEN: No acute illnesses, no fevers, chills. GI: No n/v/d, eating normally Pulm: No SOB Interactive and getting along well at home.  Otherwise, ROS is as per the HPI.     Objective:   Physical Exam   Physical Exam  Blood pressure 120/70, pulse 67, temperature 98.1 F (36.7 C), temperature source Oral, height 5' (1.524 m), weight 150 lb 6.4 oz (68.221 kg), SpO2 98.00%.  GEN: WDWN, NAD, Non-toxic, A & O x 3 HEENT: Atraumatic, Normocephalic. Neck supple. No masses, No LAD. Ears and Nose: No external deformity. EXTR: No c/c/e NEURO Normal gait.  PSYCH: Normally interactive. Conversant. Not depressed or anxious appearing.  Calm demeanor.  Decreased foot sensation      Assessment & Plan:   1. DIABETIC PERIPHERAL NEUROPATHY   2. Other malaise and fatigue    Suspect #2 from increased gabapentin dose  Decrease to 100 mg po tid, then titrate up per p/i. Recheck with Dr. Ermalene Searing in 6-8 weeks at routine check-up

## 2010-08-27 NOTE — Patient Instructions (Signed)
Gabapentin Taper:  100 mg tablet three times a day for 3 weeks  Then increase to 200 mg at bedtime for 3 weeks (and 100 mg in the morning and around lunch)  Then increase to 200 mg in the morning and before bed for 3 weeks (and 100 mg in the morning and around lunch)  Then increase to 200 mg three times a day

## 2010-09-17 ENCOUNTER — Other Ambulatory Visit: Payer: Self-pay | Admitting: Internal Medicine

## 2010-09-18 NOTE — Telephone Encounter (Signed)
zPt was on taper up of gabapentin, please call her and find out what dose she is currently on and verify refill request.

## 2010-10-03 ENCOUNTER — Other Ambulatory Visit (INDEPENDENT_AMBULATORY_CARE_PROVIDER_SITE_OTHER): Payer: Medicare PPO

## 2010-10-03 DIAGNOSIS — E119 Type 2 diabetes mellitus without complications: Secondary | ICD-10-CM

## 2010-10-03 LAB — BASIC METABOLIC PANEL
BUN: 15 mg/dL (ref 6–23)
CO2: 27 mEq/L (ref 19–32)
Chloride: 106 mEq/L (ref 96–112)
Creatinine, Ser: 0.7 mg/dL (ref 0.4–1.2)
Glucose, Bld: 96 mg/dL (ref 70–99)

## 2010-10-03 LAB — HEPATIC FUNCTION PANEL
ALT: 12 U/L (ref 0–35)
Total Protein: 6.9 g/dL (ref 6.0–8.3)

## 2010-10-03 LAB — LIPID PANEL
Cholesterol: 189 mg/dL (ref 0–200)
Total CHOL/HDL Ratio: 3
Triglycerides: 165 mg/dL — ABNORMAL HIGH (ref 0.0–149.0)

## 2010-10-03 LAB — HEMOGLOBIN A1C: Hgb A1c MFr Bld: 6 % (ref 4.6–6.5)

## 2010-10-08 ENCOUNTER — Ambulatory Visit (INDEPENDENT_AMBULATORY_CARE_PROVIDER_SITE_OTHER): Payer: Medicare PPO | Admitting: Family Medicine

## 2010-10-08 ENCOUNTER — Encounter: Payer: Self-pay | Admitting: Family Medicine

## 2010-10-08 VITALS — BP 120/72 | HR 67 | Temp 98.1°F | Ht 60.0 in | Wt 149.1 lb

## 2010-10-08 DIAGNOSIS — E1149 Type 2 diabetes mellitus with other diabetic neurological complication: Secondary | ICD-10-CM

## 2010-10-08 DIAGNOSIS — I10 Essential (primary) hypertension: Secondary | ICD-10-CM

## 2010-10-08 DIAGNOSIS — E781 Pure hyperglyceridemia: Secondary | ICD-10-CM

## 2010-10-08 DIAGNOSIS — Z23 Encounter for immunization: Secondary | ICD-10-CM

## 2010-10-08 DIAGNOSIS — E119 Type 2 diabetes mellitus without complications: Secondary | ICD-10-CM

## 2010-10-08 NOTE — Assessment & Plan Note (Signed)
Well controlled. Continue current medication.  

## 2010-10-08 NOTE — Assessment & Plan Note (Signed)
Inadequate control. Restart exercise, add fish oil back.

## 2010-10-08 NOTE — Assessment & Plan Note (Signed)
Well controlled on diet 

## 2010-10-08 NOTE — Patient Instructions (Addendum)
Restart exercise. Add back fish oil... 2000 mg divided daily. Keep up good work.

## 2010-10-08 NOTE — Assessment & Plan Note (Signed)
Improved on current dose of gabapentin. With minimal SE.

## 2010-10-08 NOTE — Progress Notes (Signed)
  Subjective:    Patient ID: Ashley Ryan, female    DOB: 09/19/1945, 65 y.o.   MRN: [id number]  HPI  Hypertension:  Well controlled on HCTZ, atenolol, amlodipine.  Using medication without problems or lightheadedness: None Chest pain with exertion:None Edema:occ minimal. Short of breath:None Average home BPs: Other issues:  Diabetes:  Well controlled with diet. Lab Results  Component Value Date   HGBA1C 6.0 10/03/2010   Using medications without difficulties:Not on Javen Hinderliter Hypoglycemic episodes:None Hyperglycemic episodes:None Feet problems:yes see below, see foot doctor Blood Sugars averaging:80-90 eye exam within last year:yes Minimal exercise plans to go back to silver sneakers, working on healthy diet, lost 1 lb Elevated Cholesterol:LDL at goal <100, but trig remain high, mildly.  Diabetic neuropathy...better control with lower side effects. 200 in AM, 100 at lunch, 200 at PM.  Allergies: using claritin.   Review of Systems  Constitutional: Negative for fever and fatigue.  HENT: Negative for ear pain.   Eyes: Negative for pain.  Respiratory: Negative for chest tightness and shortness of breath.   Cardiovascular: Negative for chest pain, palpitations and leg swelling.  Gastrointestinal: Negative for abdominal pain.  Genitourinary: Negative for dysuria.       Objective:   Physical Exam  Constitutional: Vital signs are normal. She appears well-developed and well-nourished. She is cooperative.  Non-toxic appearance. She does not appear ill. No distress.  HENT:  Head: Normocephalic.  Right Ear: Hearing, tympanic membrane, external ear and ear canal normal. Tympanic membrane is not erythematous, not retracted and not bulging.  Left Ear: Hearing, tympanic membrane, external ear and ear canal normal. Tympanic membrane is not erythematous, not retracted and not bulging.  Nose: No mucosal edema or rhinorrhea. Right sinus exhibits no maxillary sinus tenderness and no frontal  sinus tenderness. Left sinus exhibits no maxillary sinus tenderness and no frontal sinus tenderness.  Mouth/Throat: Uvula is midline, oropharynx is clear and moist and mucous membranes are normal.  Eyes: Conjunctivae, EOM and lids are normal. Pupils are equal, round, and reactive to light. No foreign bodies found.  Neck: Trachea normal and normal range of motion. Neck supple. Carotid bruit is not present. No mass and no thyromegaly present.  Cardiovascular: Normal rate, regular rhythm, S1 normal, S2 normal, normal heart sounds, intact distal pulses and normal pulses.  Exam reveals no gallop and no friction rub.   No murmur heard. Pulmonary/Chest: Effort normal and breath sounds normal. Not tachypneic. No respiratory distress. She has no decreased breath sounds. She has no wheezes. She has no rhonchi. She has no rales.  Abdominal: Soft. Normal appearance and bowel sounds are normal. There is no tenderness.  Neurological: She is alert.  Skin: Skin is warm, dry and intact. No rash noted.  Psychiatric: Her speech is normal and behavior is normal. Judgment and thought content normal. Her mood appears not anxious. Cognition and memory are normal. She does not exhibit a depressed mood.   Diabetic foot exam: Normal inspection No skin breakdown No calluses  Normal DP pulses Normal sensation to light touch and  diminishedmonofilament Nails normal        Assessment & Plan:

## 2010-10-20 ENCOUNTER — Other Ambulatory Visit: Payer: Self-pay | Admitting: Family Medicine

## 2010-11-26 ENCOUNTER — Other Ambulatory Visit: Payer: Self-pay | Admitting: *Deleted

## 2010-11-26 MED ORDER — TRAMADOL HCL 50 MG PO TABS: 50.0000 mg | ORAL_TABLET | Freq: Every day | ORAL | Status: DC | PRN

## 2010-11-26 NOTE — Telephone Encounter (Signed)
Phoned request from patient.  [facility] [place].

## 2011-01-16 ENCOUNTER — Other Ambulatory Visit: Payer: Self-pay | Admitting: *Deleted

## 2011-01-16 MED ORDER — FLUOXETINE HCL 20 MG PO CAPS: 20.0000 mg | ORAL_CAPSULE | Freq: Every day | ORAL | Status: DC

## 2011-01-16 MED ORDER — AMLODIPINE BESYLATE 5 MG PO TABS: 5.0000 mg | ORAL_TABLET | Freq: Every day | ORAL | Status: DC

## 2011-01-16 MED ORDER — ATENOLOL 100 MG PO TABS: 100.0000 mg | ORAL_TABLET | Freq: Every day | ORAL | Status: DC

## 2011-01-16 MED ORDER — HYDROCHLOROTHIAZIDE 25 MG PO TABS: 25.0000 mg | ORAL_TABLET | Freq: Every day | ORAL | Status: DC

## 2011-01-16 MED ORDER — FLUTICASONE PROPIONATE 50 MCG/ACT NA SUSP: 2.0000 | NASAL | Status: DC | PRN

## 2011-01-18 ENCOUNTER — Other Ambulatory Visit: Payer: Self-pay | Admitting: Family Medicine

## 2011-01-25 ENCOUNTER — Ambulatory Visit: Payer: Medicare PPO | Admitting: Family Medicine

## 2011-01-27 ENCOUNTER — Emergency Department: Payer: Self-pay | Admitting: Emergency Medicine

## 2011-02-28 ENCOUNTER — Telehealth: Payer: Self-pay | Admitting: Family Medicine

## 2011-02-28 MED ORDER — GABAPENTIN 100 MG PO CAPS: 100.0000 mg | ORAL_CAPSULE | Freq: Four times a day (QID) | ORAL | Status: DC

## 2011-02-28 NOTE — Telephone Encounter (Signed)
New Directions sent to pharmacy

## 2011-03-27 ENCOUNTER — Other Ambulatory Visit: Payer: Medicare PPO

## 2011-04-24 ENCOUNTER — Other Ambulatory Visit: Payer: Medicare PPO

## 2011-05-02 ENCOUNTER — Ambulatory Visit: Payer: Medicare PPO | Admitting: Family Medicine

## 2011-05-03 ENCOUNTER — Ambulatory Visit (INDEPENDENT_AMBULATORY_CARE_PROVIDER_SITE_OTHER): Payer: Medicare PPO | Admitting: *Deleted

## 2011-05-03 DIAGNOSIS — Z111 Encounter for screening for respiratory tuberculosis: Secondary | ICD-10-CM

## 2011-05-03 DIAGNOSIS — Z139 Encounter for screening, unspecified: Secondary | ICD-10-CM

## 2011-05-06 LAB — TB SKIN TEST: TB Skin Test: NEGATIVE mm

## 2011-05-21 ENCOUNTER — Telehealth: Payer: Self-pay | Admitting: Family Medicine

## 2011-05-21 DIAGNOSIS — E119 Type 2 diabetes mellitus without complications: Secondary | ICD-10-CM

## 2011-05-21 NOTE — Telephone Encounter (Signed)
Message copied by [name] on Tue May 21, 2011 10:41 AM ------      Message from: Alvina Chou      Created: Fri May 17, 2011  4:25 PM       Fasting labs,thanks,t

## 2011-05-24 ENCOUNTER — Other Ambulatory Visit: Payer: Medicare PPO

## 2011-05-30 ENCOUNTER — Ambulatory Visit: Payer: Medicare PPO | Admitting: Family Medicine

## 2011-05-30 ENCOUNTER — Other Ambulatory Visit: Payer: Self-pay | Admitting: *Deleted

## 2011-05-30 MED ORDER — GABAPENTIN 100 MG PO CAPS: 100.0000 mg | ORAL_CAPSULE | Freq: Four times a day (QID) | ORAL | Status: DC

## 2011-06-07 ENCOUNTER — Ambulatory Visit: Payer: Medicare PPO | Admitting: Family Medicine

## 2011-06-13 ENCOUNTER — Other Ambulatory Visit (INDEPENDENT_AMBULATORY_CARE_PROVIDER_SITE_OTHER): Payer: Medicare PPO

## 2011-06-13 DIAGNOSIS — IMO0001 Reserved for inherently not codable concepts without codable children: Secondary | ICD-10-CM

## 2011-06-13 DIAGNOSIS — E119 Type 2 diabetes mellitus without complications: Secondary | ICD-10-CM

## 2011-06-13 LAB — LIPID PANEL
Cholesterol: 184 mg/dL (ref 0–200)
HDL: 63.6 mg/dL (ref >39.00)
LDL Cholesterol: 101 mg/dL — ABNORMAL HIGH (ref 0–99)
Triglycerides: 97 mg/dL (ref 0.0–149.0)

## 2011-06-13 LAB — COMPREHENSIVE METABOLIC PANEL
Albumin: 3.9 g/dL (ref 3.5–5.2)
Alkaline Phosphatase: 68 U/L (ref 39–117)
BUN: 13 mg/dL (ref 6–23)
Calcium: 9.5 mg/dL (ref 8.4–10.5)
Creatinine, Ser: 0.5 mg/dL (ref 0.4–1.2)
Glucose, Bld: 92 mg/dL (ref 70–99)
Potassium: 4.1 mEq/L (ref 3.5–5.1)

## 2011-06-13 LAB — MICROALBUMIN / CREATININE URINE RATIO: Microalb Creat Ratio: 0.6 mg/g (ref 0.0–30.0)

## 2011-06-13 NOTE — Progress Notes (Signed)
Addended by: Alvina Chou on: 06/13/2011 10:04 AM   Modules accepted: Orders

## 2011-06-25 ENCOUNTER — Ambulatory Visit: Payer: Medicare PPO | Admitting: Family Medicine

## 2011-07-05 ENCOUNTER — Ambulatory Visit (INDEPENDENT_AMBULATORY_CARE_PROVIDER_SITE_OTHER): Payer: Medicare PPO | Admitting: Family Medicine

## 2011-07-05 ENCOUNTER — Encounter: Payer: Self-pay | Admitting: Family Medicine

## 2011-07-05 VITALS — BP 130/82 | HR 62 | Temp 98.0°F | Ht 60.0 in | Wt 154.0 lb

## 2011-07-05 DIAGNOSIS — E781 Pure hyperglyceridemia: Secondary | ICD-10-CM

## 2011-07-05 DIAGNOSIS — M654 Radial styloid tenosynovitis [de Quervain]: Secondary | ICD-10-CM | POA: Insufficient documentation

## 2011-07-05 DIAGNOSIS — E119 Type 2 diabetes mellitus without complications: Secondary | ICD-10-CM

## 2011-07-05 DIAGNOSIS — I10 Essential (primary) hypertension: Secondary | ICD-10-CM

## 2011-07-05 DIAGNOSIS — E1142 Type 2 diabetes mellitus with diabetic polyneuropathy: Secondary | ICD-10-CM

## 2011-07-05 DIAGNOSIS — E1149 Type 2 diabetes mellitus with other diabetic neurological complication: Secondary | ICD-10-CM

## 2011-07-05 NOTE — Patient Instructions (Addendum)
Schedule annual medicare wellness in 6 months with fasting labs prior.  Work on exercise, weight loss, healthy eating habits.  For left wrist pain: review exercise. Wear thumb spica splint daily. USe meloxicam for inflammation but stop if any stomach pain. Only use for 1-2 weeks.  Continue nexium to protect stomach. Can ice wrist and elevate.  Follow up in 2 weeks if not improving.

## 2011-07-05 NOTE — Assessment & Plan Note (Signed)
Well controlled on no medication  

## 2011-07-05 NOTE — Assessment & Plan Note (Signed)
Improved on gabatpentin.

## 2011-07-05 NOTE — Assessment & Plan Note (Signed)
Well controlled. Continue current medication.  

## 2011-07-05 NOTE — Progress Notes (Signed)
Diabetes:  Well controlled on no medication. Lab Results  Component Value Date   HGBA1C 6.0 06/13/2011  Using medications without difficulties:None Hypoglycemic episodes:None Hyperglycemic episodes:NONE Feet problems: DM neuropathy: improved on gabapentin, takes one in AM and 3 tabs at night. Blood Sugars averaging: Has not checking in a while, when she does she checks in AMs daily... Around 100s eye exam within last year:  Has gone back to work.  Hypertension:  At goal < 130/80 on amlodipine, HCTZ, atenolol,   Using medication without problems or lightheadedness: None Chest pain with exertion:None Edema:None Short of breath:None Average home BPs: 120/60 Other issues:  Elevated Cholesterol: LDL at goal <100, triglycerides improved. Lab Results  Component Value Date   CHOL 184 06/13/2011   HDL 63.60 06/13/2011   LDLCALC 101* 06/13/2011   LDLDIRECT 102.5 07/21/2009   TRIG 97.0 06/13/2011   CHOLHDL 3 06/13/2011   Diet compliance: GOod Exercise: No dedicated exercsie, back at work moving more Other complaints:  Left wrist pain, has been working on crocheting a lot.  Pain is isolated to wrist joint.No numbness or tingling in hand. May have some decreased grip strength. No past issues.  No redness, slightly swollen. No falls, no known injury. Occ uses tramadol, did not help much. Wearing a brace but lost it.  Osteoarthritis in knee, uses tramadol occ. Knee pain is improved.  Does history of gastric ulcer years ago. On nexium daily.  PMH and SH reviewed.   Vital signs, Meds and allergies reviewed.  ROS: See HPI.  Otherwise nontributory.   GEN: nad, alert and oriented HEENT: mucous membranes moist NECK: supple w/o LA CV: rrr.  no murmur PULM: ctab, no inc wob ABD: soft, +bs EXT: no edema SKIN: no acute rash LEft wrist: Nml ROM, mild redness and swelling . Focal ttp over tendon insertion radially. Positive Finklestein test.  Diabetic foot exam: Normal inspection No skin  breakdown No calluses  Normal DP pulses Diminished sensation to light tough and monofilament Nails normal, removed on great toes

## 2011-07-05 NOTE — Assessment & Plan Note (Signed)
Treat with NSAIDs (be conservative given past ULcer, continue PPI)for limited time. Wear thumb spica. Gnetle ROM exercsies, ice and elevation. Follow up in 2 weeks if not improving.

## 2011-07-05 NOTE — Assessment & Plan Note (Signed)
Resolved with increase in activity.

## 2011-07-08 ENCOUNTER — Telehealth: Payer: Self-pay | Admitting: *Deleted

## 2011-07-08 MED ORDER — MELOXICAM 15 MG PO TABS: 15.0000 mg | ORAL_TABLET | Freq: Every day | ORAL | Status: DC

## 2011-07-08 NOTE — Telephone Encounter (Signed)
Patient called and said meloxicam didn't get sent into pharmacy on Friday. [facility]. Thanks

## 2011-07-08 NOTE — Telephone Encounter (Signed)
Please let know done

## 2011-07-09 NOTE — Telephone Encounter (Signed)
Left message on machine advising patient. 

## 2011-10-15 ENCOUNTER — Encounter: Payer: Self-pay | Admitting: Family Medicine

## 2011-10-15 ENCOUNTER — Ambulatory Visit (INDEPENDENT_AMBULATORY_CARE_PROVIDER_SITE_OTHER): Payer: Medicare PPO | Admitting: Family Medicine

## 2011-10-15 VITALS — BP 137/71 | HR 59 | Temp 98.2°F | Ht 61.5 in | Wt 148.0 lb

## 2011-10-15 DIAGNOSIS — M654 Radial styloid tenosynovitis [de Quervain]: Secondary | ICD-10-CM

## 2011-10-15 DIAGNOSIS — Z23 Encounter for immunization: Secondary | ICD-10-CM

## 2011-10-15 DIAGNOSIS — M1710 Unilateral primary osteoarthritis, unspecified knee: Secondary | ICD-10-CM

## 2011-10-15 NOTE — Progress Notes (Signed)
Subjective:    Patient ID: Ashley Ryan, female    DOB: [birth year], 66 y.o.   MRN: [id number]  HPI  66 year old female with history of OA, right knee arthroscopic surgery ( MD at [facility]).   Now in last 2 weeks left knee has become tender. Right knee also hurting. No redness, no swelling. No clicking or popping. Feels like it gives way.  Limping some. No recent falls, no injuries.  Has had injections steroid and synvisc in right knee in 2011..with Dr. Patsy Lager. Helped for a while.  She cannot take meloxicam because of her ulcer. Gives her stomach pain.  Using tramadol for pain... Using three times a day. Helps some but no a whole lot.  Also Dequervain tenosynovittis not improved with limited antiinflammatory and brace.    Review of Systems  Constitutional: Negative for fever and fatigue.  HENT: Negative for ear pain.   Eyes: Negative for pain.  Respiratory: Negative for chest tightness and shortness of breath.   Cardiovascular: Negative for chest pain, palpitations and leg swelling.  Gastrointestinal: Negative for abdominal pain.  Genitourinary: Negative for dysuria.       Objective:   Physical Exam  Constitutional: Vital signs are normal. She appears well-developed and well-nourished. She is cooperative.  Non-toxic appearance. She does not appear ill. No distress.  HENT:  Head: Normocephalic.  Right Ear: Hearing, tympanic membrane, external ear and ear canal normal. Tympanic membrane is not erythematous, not retracted and not bulging.  Left Ear: Hearing, tympanic membrane, external ear and ear canal normal. Tympanic membrane is not erythematous, not retracted and not bulging.  Nose: No mucosal edema or rhinorrhea. Right sinus exhibits no maxillary sinus tenderness and no frontal sinus tenderness. Left sinus exhibits no maxillary sinus tenderness and no frontal sinus tenderness.  Mouth/Throat: Uvula is midline, oropharynx is clear and moist and mucous membranes are normal.    Eyes: Conjunctivae normal, EOM and lids are normal. Pupils are equal, round, and reactive to light. No foreign bodies found.  Neck: Trachea normal and normal range of motion. Neck supple. Carotid bruit is not present. No mass and no thyromegaly present.  Cardiovascular: Normal rate, regular rhythm, S1 normal, S2 normal, normal heart sounds, intact distal pulses and normal pulses.  Exam reveals no gallop and no friction rub.   No murmur heard. Pulmonary/Chest: Effort normal and breath sounds normal. Not tachypneic. No respiratory distress. She has no decreased breath sounds. She has no wheezes. She has no rhonchi. She has no rales.  Abdominal: Soft. Normal appearance and bowel sounds are normal. There is no tenderness.  Musculoskeletal:       Left wrist: She exhibits decreased range of motion, tenderness and bony tenderness.       Right knee: She exhibits normal range of motion, no effusion, normal alignment and normal meniscus. tenderness found. Medial joint line tenderness noted. No MCL, no LCL and no patellar tendon tenderness noted.       Left knee: She exhibits normal range of motion, no swelling, no effusion, no erythema, no LCL laxity and normal meniscus. tenderness found. Medial joint line tenderness noted. No MCL, no LCL and no patellar tendon tenderness noted.  Neurological: She is alert.  Skin: Skin is warm, dry and intact. No rash noted.  Psychiatric: Her speech is normal and behavior is normal. Judgment and thought content normal. Her mood appears not anxious. Cognition and memory are normal. She does not exhibit a depressed mood.  Assessment & Plan:

## 2011-10-15 NOTE — Assessment & Plan Note (Signed)
Has had synvisc and steroid in past in right with improvement.  Now both knees painful... Refer to ortho .  Cannot take antiinflammatory.. Use tramadol for pain.

## 2011-10-15 NOTE — Patient Instructions (Addendum)
Stop at front desk for referral to [facility].  Use tramadol for pain.

## 2011-10-22 ENCOUNTER — Other Ambulatory Visit: Payer: Self-pay | Admitting: Family Medicine

## 2011-10-22 NOTE — Telephone Encounter (Signed)
Electronic refill request

## 2011-10-28 ENCOUNTER — Other Ambulatory Visit: Payer: Self-pay | Admitting: Family Medicine

## 2011-11-04 ENCOUNTER — Other Ambulatory Visit: Payer: Self-pay | Admitting: Family Medicine

## 2011-11-22 ENCOUNTER — Other Ambulatory Visit: Payer: Self-pay | Admitting: Family Medicine

## 2012-01-02 ENCOUNTER — Other Ambulatory Visit: Payer: Medicare PPO

## 2012-01-02 ENCOUNTER — Telehealth: Payer: Self-pay | Admitting: Family Medicine

## 2012-01-02 DIAGNOSIS — E119 Type 2 diabetes mellitus without complications: Secondary | ICD-10-CM

## 2012-01-02 DIAGNOSIS — E781 Pure hyperglyceridemia: Secondary | ICD-10-CM

## 2012-01-02 NOTE — Telephone Encounter (Signed)
Message copied by Kerby Nora E on Thu Jan 02, 2012  1:30 AM ------      Message from: Baldomero Lamy      Created: Wed Jan 01, 2012  8:29 AM      Regarding: Cpx labs tomorrow 01/02/12       Please order  future cpx labs for pt's upcomming lab appt.      Thanks      Rodney Booze

## 2012-01-09 ENCOUNTER — Encounter: Payer: Medicare PPO | Admitting: Family Medicine

## 2012-01-16 ENCOUNTER — Other Ambulatory Visit: Payer: Self-pay | Admitting: Family Medicine

## 2012-02-10 ENCOUNTER — Other Ambulatory Visit: Payer: Self-pay

## 2012-02-10 MED ORDER — FLUOXETINE HCL 20 MG PO CAPS: ORAL_CAPSULE | ORAL | Status: DC

## 2012-02-10 NOTE — Telephone Encounter (Signed)
Pt left v/m requesting refill prozac to [facility] [place].advised pt can refill # 30 but needs to reschedule appt. Pt said she will ck her schedule and call back next week for appt.

## 2012-02-11 ENCOUNTER — Other Ambulatory Visit: Payer: Self-pay

## 2012-02-11 MED ORDER — ATENOLOL 100 MG PO TABS: 100.0000 mg | ORAL_TABLET | Freq: Every day | ORAL | Status: DC

## 2012-02-11 NOTE — Telephone Encounter (Signed)
Pt request refill atenolol #90 x 0 to [facility] [place].pt notified done.

## 2012-03-04 ENCOUNTER — Other Ambulatory Visit: Payer: Self-pay | Admitting: Family Medicine

## 2012-03-26 ENCOUNTER — Other Ambulatory Visit (INDEPENDENT_AMBULATORY_CARE_PROVIDER_SITE_OTHER): Payer: Medicare PPO

## 2012-03-26 DIAGNOSIS — E119 Type 2 diabetes mellitus without complications: Secondary | ICD-10-CM

## 2012-03-26 LAB — LIPID PANEL
Cholesterol: 171 mg/dL (ref 0–200)
HDL: 64.9 mg/dL (ref >39.00)
VLDL: 17.6 mg/dL (ref 0.0–40.0)

## 2012-03-26 LAB — COMPREHENSIVE METABOLIC PANEL
CO2: 26 mEq/L (ref 19–32)
Creatinine, Ser: 0.7 mg/dL (ref 0.4–1.2)
GFR: 92.8 mL/min (ref >60.00)
Glucose, Bld: 91 mg/dL (ref 70–99)
Total Bilirubin: 0.3 mg/dL (ref 0.3–1.2)
Total Protein: 6.7 g/dL (ref 6.0–8.3)

## 2012-04-02 ENCOUNTER — Encounter: Payer: Self-pay | Admitting: Family Medicine

## 2012-04-02 ENCOUNTER — Ambulatory Visit (INDEPENDENT_AMBULATORY_CARE_PROVIDER_SITE_OTHER): Payer: Medicare PPO | Admitting: Family Medicine

## 2012-04-02 VITALS — BP 120/82 | HR 57 | Temp 98.3°F | Ht 61.5 in | Wt 154.5 lb

## 2012-04-02 DIAGNOSIS — Z0000 Encounter for general adult medical examination without abnormal findings: Secondary | ICD-10-CM

## 2012-04-02 MED ORDER — ATENOLOL 100 MG PO TABS: 100.0000 mg | ORAL_TABLET | Freq: Every day | ORAL | Status: DC

## 2012-04-02 MED ORDER — FLUOXETINE HCL 20 MG PO CAPS: ORAL_CAPSULE | ORAL | Status: DC

## 2012-04-02 MED ORDER — HYDROCHLOROTHIAZIDE 25 MG PO TABS: ORAL_TABLET | ORAL | Status: DC

## 2012-04-02 MED ORDER — FLUTICASONE PROPIONATE 50 MCG/ACT NA SUSP: 2.0000 | Freq: Every day | NASAL | Status: DC

## 2012-04-02 MED ORDER — TRAMADOL HCL 50 MG PO TABS: 50.0000 mg | ORAL_TABLET | Freq: Every day | ORAL | Status: DC | PRN

## 2012-04-02 MED ORDER — AMLODIPINE BESYLATE 5 MG PO TABS: ORAL_TABLET | ORAL | Status: DC

## 2012-04-02 MED ORDER — ESOMEPRAZOLE MAGNESIUM 40 MG PO CPDR: DELAYED_RELEASE_CAPSULE | ORAL | Status: DC

## 2012-04-02 NOTE — Assessment & Plan Note (Signed)
Increase bedtime dose to 3 cap to improve control.

## 2012-04-02 NOTE — Assessment & Plan Note (Signed)
Resolved on no med. 

## 2012-04-02 NOTE — Assessment & Plan Note (Signed)
Well controlled on no med. 

## 2012-04-02 NOTE — Assessment & Plan Note (Signed)
Well controlled. Continue current medication.  

## 2012-04-02 NOTE — Patient Instructions (Addendum)
Get back on track with exercise and weight loss. Remember colonoscopy due this year.. Call Dr. Larae Grooms if they don't set this up for you this year. Call to schedule mammogram on your own at [facility].  Stop at front desk to set up bone density. Follow up DM in 6 months with fasting labs prior. Look into living will and HCPOA.

## 2012-04-02 NOTE — Addendum Note (Signed)
Addended by: Consuello Masse on: 04/02/2012 01:11 PM   Modules accepted: Orders

## 2012-04-02 NOTE — Assessment & Plan Note (Signed)
Refilled tramadol.

## 2012-04-02 NOTE — Progress Notes (Signed)
Subjective:    Patient ID: Ashley Ryan, female    DOB: Oct 27, 1945, 67 y.o.   MRN: [id number]  HPI  I have personally reviewed the Medicare Annual Wellness questionnaire and have noted 1. The patient's medical and social history 2. Their use of alcohol, tobacco or illicit drugs 3. Their current medications and supplements 4. The patient's functional ability including ADL's, fall risks, home safety risks and hearing or visual             impairment. 5. Diet and physical activities 6. Evidence for depression or mood disorders The patients weight, height, BMI and visual acuity have been recorded in the chart I have made referrals, counseling and provided education to the patient based review of the above and I have provided the pt with a written personalized care plan for preventive services.  Diabetes: Well controlled on no medication.  Lab Results  Component Value Date   HGBA1C 6.0 03/26/2012  Using medications without difficulties:None  Hypoglycemic episodes:None  Hyperglycemic episodes:NONE  Feet problems: DM neuropathy: improved on gabapentin, takes one in AM and 3 tabs at night.  Blood Sugars averaging: Has not checking in a while, when she does she checks in AMs daily... Around 100s  eye exam within last year:  Has gone back to work.   Hypertension: At goal < 130/80 on amlodipine, HCTZ, atenolol,  Using medication without problems or lightheadedness: None  Chest pain with exertion:None  Edema:Rarely Short of breath:None  Average home BPs: not checking Other issues:   Elevated Cholesterol: LDL at goal <100, triglycerides improved.  On no med. Lab Results  Component Value Date   CHOL 171 03/26/2012   HDL 64.90 03/26/2012   LDLCALC 89 03/26/2012   LDLDIRECT 102.5 07/21/2009   TRIG 88.0 03/26/2012   CHOLHDL 3 03/26/2012   Diet compliance: Good  Exercise: No dedicated exercsie, back at work moving more  Other complaints:   Osteoarthritis in knee, uses tramadol occ. Knee pain is  improved.  Does history of gastric ulcer years ago.  On nexium daily.   Peripheral neuropathy: Still having nighttime pain.  Wt Readings from Last 3 Encounters:  04/02/12 154 lb 8 oz (70.081 kg)  10/15/11 148 lb (67.132 kg)  07/05/11 154 lb (69.854 kg)     Review of Systems  Constitutional: Negative for fever, fatigue and unexpected weight change.  HENT: Negative for ear pain, congestion, sore throat, sneezing, trouble swallowing and sinus pressure.   Eyes: Negative for pain and itching.  Respiratory: Negative for cough, shortness of breath and wheezing.   Cardiovascular: Negative for chest pain, palpitations and leg swelling.  Gastrointestinal: Negative for nausea, abdominal pain, diarrhea, constipation and blood in stool.  Genitourinary: Negative for dysuria, hematuria, vaginal discharge, difficulty urinating and menstrual problem.  Skin: Negative for rash.  Neurological: Negative for syncope, weakness, light-headedness, numbness and headaches.  Psychiatric/Behavioral: Negative for confusion and dysphoric mood. The patient is not nervous/anxious.        Objective:   Physical Exam  Constitutional: Vital signs are normal. She appears well-developed and well-nourished. She is cooperative.  Non-toxic appearance. She does not appear ill. No distress.  HENT:  Head: Normocephalic.  Right Ear: Hearing, tympanic membrane, external ear and ear canal normal.  Left Ear: Hearing, tympanic membrane, external ear and ear canal normal.  Nose: Nose normal.  Eyes: Conjunctivae, EOM and lids are normal. Pupils are equal, round, and reactive to light. No foreign bodies found.  Neck: Trachea normal and normal range of motion.  Neck supple. Carotid bruit is not present. No mass and no thyromegaly present.  Cardiovascular: Normal rate, regular rhythm, S1 normal, S2 normal, normal heart sounds and intact distal pulses.  Exam reveals no gallop.   No murmur heard. Pulmonary/Chest: Effort normal and  breath sounds normal. No respiratory distress. She has no wheezes. She has no rhonchi. She has no rales.  Abdominal: Soft. Normal appearance and bowel sounds are normal. She exhibits no distension, no fluid wave, no abdominal bruit and no mass. There is no hepatosplenomegaly. There is no tenderness. There is no rebound, no guarding and no CVA tenderness. No hernia. Hernia confirmed negative in the right inguinal area and confirmed negative in the left inguinal area.  Genitourinary: Vagina normal. No breast swelling, tenderness, discharge or bleeding. No labial fusion. There is no rash, tenderness, lesion or injury on the right labia. There is no rash, tenderness, lesion or injury on the left labia. Right adnexum displays no mass, no tenderness and no fullness. Left adnexum displays no mass, no tenderness and no fullness.  Lymphadenopathy:    She has no cervical adenopathy.    She has no axillary adenopathy.       Right: No inguinal adenopathy present.       Left: No inguinal adenopathy present.  Neurological: She is alert. She has normal strength. No cranial nerve deficit or sensory deficit.  Skin: Skin is warm, dry and intact. No rash noted.  Psychiatric: Her speech is normal and behavior is normal. Judgment normal. Her mood appears not anxious. Cognition and memory are normal. She does not exhibit a depressed mood.          Assessment & Plan:  The patient's preventative maintenance and recommended screening tests for an annual wellness exam were reviewed in full today. Brought up to date unless services declined.  Vaccines: uptodate td and PNA, refused shingles, not covered. DEXA: last done years ago, nml Colon: Due this year, nml 2003, Jacob's Mammo:Due, sister breast cancer age 31. PAP/DVE: partial hysterectomy, no pap smear required, DVE yearly. No family history of ovarian cancer.

## 2012-04-10 ENCOUNTER — Telehealth: Payer: Self-pay

## 2012-04-10 NOTE — Telephone Encounter (Signed)
Ashley Ryan, pt advocate specialist with [employer] medical form left v/m; requesting call back to acknowledge forms received for lower lumbar orthotic,bilateral knee and lt wrist orthosis.Please advise.

## 2012-04-10 NOTE — Telephone Encounter (Signed)
Forms received.  

## 2012-04-30 ENCOUNTER — Encounter: Payer: Self-pay | Admitting: Family Medicine

## 2012-04-30 ENCOUNTER — Ambulatory Visit: Payer: Self-pay | Admitting: Family Medicine

## 2012-04-30 DIAGNOSIS — M810 Age-related osteoporosis without current pathological fracture: Secondary | ICD-10-CM | POA: Insufficient documentation

## 2012-04-30 LAB — HM MAMMOGRAPHY

## 2012-05-01 ENCOUNTER — Ambulatory Visit (INDEPENDENT_AMBULATORY_CARE_PROVIDER_SITE_OTHER): Payer: Medicare PPO | Admitting: Family Medicine

## 2012-05-01 ENCOUNTER — Ambulatory Visit: Payer: Medicare PPO | Admitting: Family Medicine

## 2012-05-01 ENCOUNTER — Encounter: Payer: Self-pay | Admitting: Family Medicine

## 2012-05-01 VITALS — BP 110/70 | HR 71 | Temp 98.1°F | Ht 61.5 in | Wt 155.5 lb

## 2012-05-01 DIAGNOSIS — M810 Age-related osteoporosis without current pathological fracture: Secondary | ICD-10-CM

## 2012-05-01 MED ORDER — ALENDRONATE SODIUM 70 MG PO TABS: 70.0000 mg | ORAL_TABLET | ORAL | Status: DC

## 2012-05-01 NOTE — Assessment & Plan Note (Signed)
Discussed options. Start bisphosphonate, ca and vit D... Repeat DEXA in 2 years... Plan 4 -5 year course of medication. Instructed pt how to take med.

## 2012-05-01 NOTE — Patient Instructions (Addendum)
Start regular weight bearing exercise. Ca 600 mg and vit D3 400 IU twice a day. Start weekly alendronate... We will recheck bone density in  2 years.

## 2012-05-01 NOTE — Progress Notes (Signed)
  Subjective:    Patient ID: Mai Longnecker, female    DOB: 12-15-1945, 67 y.o.   MRN: [id number]  HPI 67 year old female returnes to clinic today to discuss DEXA results.  DEXa shows  T scores  worsening compared to 2010  Spine -2.6 osteoporosis Hip -2.2 osteopenia No current exercsie.. plkans to start.  Currently taking ca and vit D.   Review of Systems  Constitutional: Negative for fever and fatigue.  HENT: Negative for ear pain.   Eyes: Negative for pain.  Respiratory: Negative for chest tightness and shortness of breath.   Cardiovascular: Negative for chest pain, palpitations and leg swelling.  Gastrointestinal: Negative for abdominal pain.  Genitourinary: Negative for dysuria.       Objective:   Physical Exam  Constitutional: She is oriented to person, place, and time. She appears well-developed and well-nourished.  Neck: Normal range of motion. Neck supple.  Cardiovascular: Normal rate and regular rhythm.   No murmur heard. Pulmonary/Chest: Effort normal and breath sounds normal.  Neurological: She is alert and oriented to person, place, and time.  Skin: Skin is warm.          Assessment & Plan:

## 2012-05-04 ENCOUNTER — Ambulatory Visit (INDEPENDENT_AMBULATORY_CARE_PROVIDER_SITE_OTHER): Payer: Medicare PPO | Admitting: *Deleted

## 2012-05-04 DIAGNOSIS — Z111 Encounter for screening for respiratory tuberculosis: Secondary | ICD-10-CM

## 2012-05-07 ENCOUNTER — Encounter: Payer: Self-pay | Admitting: *Deleted

## 2012-06-07 ENCOUNTER — Other Ambulatory Visit: Payer: Self-pay | Admitting: Family Medicine

## 2012-06-22 ENCOUNTER — Encounter: Payer: Self-pay | Admitting: Family Medicine

## 2012-06-22 ENCOUNTER — Ambulatory Visit (INDEPENDENT_AMBULATORY_CARE_PROVIDER_SITE_OTHER): Payer: Medicare PPO | Admitting: Family Medicine

## 2012-06-22 VITALS — BP 120/84 | HR 63 | Temp 98.2°F | Ht 61.5 in | Wt 154.5 lb

## 2012-06-22 DIAGNOSIS — M1710 Unilateral primary osteoarthritis, unspecified knee: Secondary | ICD-10-CM

## 2012-06-22 DIAGNOSIS — M1712 Unilateral primary osteoarthritis, left knee: Secondary | ICD-10-CM

## 2012-06-22 DIAGNOSIS — M1711 Unilateral primary osteoarthritis, right knee: Secondary | ICD-10-CM

## 2012-06-22 MED ORDER — TRAMADOL HCL 50 MG PO TABS: 50.0000 mg | ORAL_TABLET | Freq: Every day | ORAL | Status: DC | PRN

## 2012-06-22 NOTE — Progress Notes (Signed)
Nature conservation officer at [facility] [address] [place] [address] Phone: [phone] Fax: [phone]  Date:  06/22/2012   Name:  Ashley Ryan   DOB:  04-16-1945   MRN:  [id number] Gender: female Age: 67 y.o.  Primary Physician:  Kerby Nora, MD  Evaluating MD: Hannah Beat, MD   Chief Complaint: Knee Pain   History of Present Illness:  Ashley Ryan is a 67 y.o. pleasant patient who presents with the following:  L > R knee pain.  Patient with known OA, had a great response from [place] a few years ago, > 2 year response. Now with significant B knee pain, L > R. Some altered gait.  Patient Active Problem List   Diagnosis Date Noted  . Osteoporosis, unspecified 04/30/2012  . De Quervain's tenosynovitis, left 07/05/2011  . DIABETIC PERIPHERAL NEUROPATHY 12/27/2009  . Osteoarthritis, bilateral knees 11/23/2009  . DIABETES MELLITUS, TYPE II 02/03/2009  . HYPERTRIGLYCERIDEMIA 02/03/2009  . COMMON MIGRAINE 11/03/2008  . ESSENTIAL HYPERTENSION, BENIGN 11/03/2008  . ALLERGIC RHINITIS DUE TO POLLEN 11/03/2008  . HIATAL HERNIA WITH REFLUX 11/03/2008  . DIVERTICULOSIS OF COLON 11/03/2008  . MURMUR 11/03/2008  . GASTRIC ULCER, HX OF 11/03/2008    Past Medical History  Diagnosis Date  . Arthritis   . Chronic headache   . Diverticulosis of colon   . Hypertension     Past Surgical History  Procedure Laterality Date  . Cholecystectomy    . Abdominal hysterectomy      one ovary remains    History   Social History  . Marital Status: Married    Spouse Name: N/A    Number of Children: 2  . Years of Education: N/A   Occupational History  . retired cna    Social History Main Topics  . Smoking status: Never Smoker   . Smokeless tobacco: Never Used  . Alcohol Use: No  . Drug Use: No  . Sexually Active: Not on file   Other Topics Concern  . Not on file   Social History Narrative   Drinks sweet tea, diet sodas 4 a day   No living will, full code  (reviewed 2014)    No family history on file.  Allergies  Allergen Reactions  . Sulfonamide Derivatives     Medication list has been reviewed and updated.  Outpatient Prescriptions Prior to Visit  Medication Sig Dispense Refill  . alendronate (FOSAMAX) 70 MG tablet Take 1 tablet (70 mg total) by mouth every 7 (seven) days. Take with a full glass of water on an empty stomach.  4 tablet  11  . amLODipine (NORVASC) 5 MG tablet TAKE 1 TABLET EVERY DAY  90 tablet  3  . aspirin 81 MG tablet Take 81 mg by mouth daily.        Marland Kitchen atenolol (TENORMIN) 100 MG tablet TAKE 1 TABLET (100 MG TOTAL) BY MOUTH DAILY.  90 tablet  0  . Calcium Carbonate-Vitamin D (CALCIUM 600+D) 600-400 MG-UNIT per tablet Take 1 tablet by mouth daily.        Marland Kitchen esomeprazole (NEXIUM) 40 MG capsule TAKE ONE CAPSULE BY MOUTH EVERY DAY  90 capsule  3  . FLUoxetine (PROZAC) 20 MG capsule TAKE ONE CAPSULE BY MOUTH EVERY DAY  90 capsule  3  . fluticasone (FLONASE) 50 MCG/ACT nasal spray Place 2 sprays into the nose daily.  16 g  2  . gabapentin (NEURONTIN) 100 MG capsule       . Glucosamine  500 MG TABS Take 1 tablet by mouth daily.        . hydrochlorothiazide (HYDRODIURIL) 25 MG tablet TAKE 1 TABLET EVERY DAY  90 tablet  3  . Loratadine (CLARITIN) 10 MG CAPS Take 1 capsule by mouth as needed.        . traMADol (ULTRAM) 50 MG tablet Take 1 tablet (50 mg total) by mouth daily as needed. For knee pain  30 tablet  1  . atenolol (TENORMIN) 100 MG tablet Take 1 tablet (100 mg total) by mouth daily.  90 tablet  0   No facility-administered medications prior to visit.    Review of Systems:   GEN: No fevers, chills. Nontoxic. Primarily MSK c/o today. MSK: Detailed in the HPI GI: tolerating PO intake without difficulty Neuro: No numbness, parasthesias, or tingling associated. Otherwise the pertinent positives of the ROS are noted above.    Physical Examination: BP 120/84  Pulse 63  Temp(Src) 98.2 F (36.8 C) (Oral)  Ht 5'  1.5" (1.562 m)  Wt 154 lb 8 oz (70.081 kg)  BMI 28.72 kg/m2  SpO2 98%  Ideal Body Weight: Weight in (lb) to have BMI = 25: 134.2   GEN: WDWN, NAD, Non-toxic, Alert & Oriented x 3 HEENT: Atraumatic, Normocephalic.  Ears and Nose: No external deformity. EXTR: No clubbing/cyanosis/edema NEURO: antalgic gait.  PSYCH: Normally interactive. Conversant. Not depressed or anxious appearing.  Calm demeanor.  B medial joint lines TTP > lateral. Stable mcl, lcl, acl, pcl. mcmurrays negative. Bounce home neg. Pes mildly ttp.  Assessment and Plan:  Osteoarthritis of left knee  Osteoarthritis of right knee  Refill tramadol. Check response to intraarticular steroids. If wains quickly, f/u for synvisc injections.   Knee Injection, LEFT Patient verbally consented to procedure. Risks (including potential rare risk of infection), benefits, and alternatives explained. Sterilely prepped with Chloraprep. Ethyl cholride used for anesthesia. 8 cc Lidocaine 1% mixed with 2 cc of Depo-Medrol 40 mg injected using the anterolateral approach without difficulty. No complications with procedure and tolerated well. Patient had decreased pain post-injection.  Knee Injection, RIGHT Patient verbally consented to procedure. Risks (including potential rare risk of infection), benefits, and alternatives explained. Sterilely prepped with Chloraprep. Ethyl cholride used for anesthesia. 8 cc Lidocaine 1% mixed with 2 cc of Depo-Medrol 40 mg injected using the anterolateral approach without difficulty. No complications with procedure and tolerated well. Patient had decreased pain post-injection.    Orders Today:  No orders of the defined types were placed in this encounter.    Updated Medication List: (Includes new medications, updates to list, dose adjustments) Meds ordered this encounter  Medications  . traMADol (ULTRAM) 50 MG tablet    Sig: Take 1 tablet (50 mg total) by mouth daily as needed. For knee pain     Dispense:  30 tablet    Refill:  5    Medications Discontinued: Medications Discontinued During This Encounter  Medication Reason  . atenolol (TENORMIN) 100 MG tablet Error  . traMADol (ULTRAM) 50 MG tablet Reorder      Signed, Karleen Hampshire T. Noorah Giammona, MD 06/22/2012 9:52 AM

## 2012-06-23 ENCOUNTER — Other Ambulatory Visit: Payer: Self-pay | Admitting: Family Medicine

## 2012-07-01 ENCOUNTER — Other Ambulatory Visit: Payer: Self-pay | Admitting: *Deleted

## 2012-07-01 MED ORDER — FLUTICASONE PROPIONATE 50 MCG/ACT NA SUSP: 2.0000 | Freq: Every day | NASAL | Status: DC

## 2012-07-02 ENCOUNTER — Other Ambulatory Visit: Payer: Self-pay | Admitting: Family Medicine

## 2012-09-06 ENCOUNTER — Other Ambulatory Visit: Payer: Self-pay | Admitting: Family Medicine

## 2012-09-07 ENCOUNTER — Other Ambulatory Visit: Payer: Self-pay | Admitting: Family Medicine

## 2012-09-07 NOTE — Telephone Encounter (Signed)
Received refill request electronically. Called pharmacy since patient has refills. Was advised that since this medication has changed over to control they need a new scripts.

## 2012-09-08 NOTE — Telephone Encounter (Signed)
Rx called to pharmacy

## 2012-09-22 ENCOUNTER — Encounter: Payer: Self-pay | Admitting: Radiology

## 2012-09-23 ENCOUNTER — Encounter: Payer: Self-pay | Admitting: Family Medicine

## 2012-09-23 ENCOUNTER — Ambulatory Visit (INDEPENDENT_AMBULATORY_CARE_PROVIDER_SITE_OTHER): Payer: Medicare PPO | Admitting: Family Medicine

## 2012-09-23 VITALS — BP 124/74 | HR 64 | Temp 98.2°F | Ht 61.5 in | Wt 154.0 lb

## 2012-09-23 DIAGNOSIS — M1710 Unilateral primary osteoarthritis, unspecified knee: Secondary | ICD-10-CM

## 2012-09-23 NOTE — Progress Notes (Signed)
   Nature conservation officer at [facility] [address] [place] [address] Phone: [phone] Fax: [phone]  Date:  09/23/2012   Name:  Ashley Ryan   DOB:  1945-02-04   MRN:  [id number] Gender: female Age: 67 y.o.  Primary Physician:  Kerby Nora, MD  Evaluating MD: Hannah Beat, MD  Chief Complaint: Knee Pain   History of Present Illness:  Ashley Ryan is a 67 y.o. very pleasant female patient who presents with the following:  Sleeping on back with heating pad.  Pleasant patient who n well who has significant bilateral knee arthritis and knee pain. She responded well previously to both Synvisc and Depo-Medrol injections.  Knee Injection, R Patient verbally consented to procedure. Risks (including potential rare risk of infection), benefits, and alternatives explained. Sterilely prepped with Chloraprep. Ethyl cholride used for anesthesia. 8 cc Lidocaine 1% mixed with 2 cc of Depo-Medrol 40 mg injected using the anterolateral approach without difficulty. No complications with procedure and tolerated well. Patient had decreased pain post-injection.   Knee Injection, L Patient verbally consented to procedure. Risks (including potential rare risk of infection), benefits, and alternatives explained. Sterilely prepped with Chloraprep. Ethyl cholride used for anesthesia. 8 cc Lidocaine 1% mixed with 2 cc of Depo-Medrol 40 mg injected using the anterolateral approach without difficulty. No complications with procedure and tolerated well. Patient had decreased pain post-injection.

## 2012-10-06 ENCOUNTER — Ambulatory Visit: Payer: Medicare PPO | Admitting: Family Medicine

## 2012-10-19 ENCOUNTER — Other Ambulatory Visit: Payer: Self-pay | Admitting: Family Medicine

## 2012-10-19 NOTE — Telephone Encounter (Signed)
Ok to refill #40, 3 refills

## 2012-10-19 NOTE — Telephone Encounter (Signed)
Called to pharmacy 

## 2012-10-19 NOTE — Telephone Encounter (Signed)
Last seen by Dr. Patsy Lager 09/23/2012.  Ok to refill?

## 2012-11-05 ENCOUNTER — Ambulatory Visit (INDEPENDENT_AMBULATORY_CARE_PROVIDER_SITE_OTHER)
Admission: RE | Admit: 2012-11-05 | Discharge: 2012-11-05 | Disposition: A | Discharge status: Home / Self Care | Payer: Medicare PPO | Source: Ambulatory Visit | Attending: Family Medicine | Admitting: Family Medicine

## 2012-11-05 ENCOUNTER — Ambulatory Visit (INDEPENDENT_AMBULATORY_CARE_PROVIDER_SITE_OTHER): Payer: Medicare PPO | Admitting: Family Medicine

## 2012-11-05 ENCOUNTER — Encounter: Payer: Self-pay | Admitting: Family Medicine

## 2012-11-05 VITALS — BP 140/70 | HR 71 | Temp 98.2°F | Ht 61.5 in | Wt 155.0 lb

## 2012-11-05 DIAGNOSIS — M1710 Unilateral primary osteoarthritis, unspecified knee: Secondary | ICD-10-CM

## 2012-11-05 DIAGNOSIS — M25569 Pain in unspecified knee: Secondary | ICD-10-CM

## 2012-11-05 DIAGNOSIS — M25562 Pain in left knee: Secondary | ICD-10-CM

## 2012-11-05 DIAGNOSIS — M1712 Unilateral primary osteoarthritis, left knee: Secondary | ICD-10-CM

## 2012-11-05 NOTE — Progress Notes (Signed)
Date:  11/05/2012   Name:  Ashley Ryan   DOB:  August 15, 1945   MRN:  [id number] Gender: female Age: 67 y.o.  Primary Physician:  Kerby Nora, MD   Chief Complaint: Knee Pain   History of Present Illness:  Ashley Ryan is a 67 y.o. pleasant patient who presents with the following:  2 weeks ago, fell on bottom and knee has been hurting ever since. Not sure, but may have hurt knee.  Started to hurt immediately. Mostly medially. Mild swelling. Significant pain and pain with deep flexion.  LEFT knee.  Patient Active Problem List   Diagnosis Date Noted  . Osteoporosis, unspecified 04/30/2012  . De Quervain's tenosynovitis, left 07/05/2011  . DIABETIC PERIPHERAL NEUROPATHY 12/27/2009  . Osteoarthritis, bilateral knees 11/23/2009  . DIABETES MELLITUS, TYPE II 02/03/2009  . HYPERTRIGLYCERIDEMIA 02/03/2009  . COMMON MIGRAINE 11/03/2008  . ESSENTIAL HYPERTENSION, BENIGN 11/03/2008  . ALLERGIC RHINITIS DUE TO POLLEN 11/03/2008  . HIATAL HERNIA WITH REFLUX 11/03/2008  . DIVERTICULOSIS OF COLON 11/03/2008  . MURMUR 11/03/2008  . GASTRIC ULCER, HX OF 11/03/2008    Past Medical History  Diagnosis Date  . Arthritis   . Chronic headache   . Diverticulosis of colon   . Hypertension     Past Surgical History  Procedure Laterality Date  . Cholecystectomy    . Abdominal hysterectomy      one ovary remains    History   Social History  . Marital Status: Married    Spouse Name: N/A    Number of Children: 2  . Years of Education: N/A   Occupational History  . retired cna    Social History Main Topics  . Smoking status: Never Smoker   . Smokeless tobacco: Never Used  . Alcohol Use: No  . Drug Use: No  . Sexual Activity: Not on file   Other Topics Concern  . Not on file   Social History Narrative   Drinks sweet tea, diet sodas 4 a day   No living will, full code (reviewed 2014)    No family history on file.  Allergies  Allergen Reactions  . Sulfonamide  Derivatives     Medication list has been reviewed and updated.  Outpatient Prescriptions Prior to Visit  Medication Sig Dispense Refill  . alendronate (FOSAMAX) 70 MG tablet Take 1 tablet (70 mg total) by mouth every 7 (seven) days. Take with a full glass of water on an empty stomach.  4 tablet  11  . amLODipine (NORVASC) 5 MG tablet TAKE 1 TABLET EVERY DAY  90 tablet  3  . aspirin 81 MG tablet Take 81 mg by mouth daily.        Marland Kitchen atenolol (TENORMIN) 100 MG tablet TAKE 1 TABLET (100 MG TOTAL) BY MOUTH DAILY.  90 tablet  0  . Calcium Carbonate-Vitamin D (CALCIUM 600+D) 600-400 MG-UNIT per tablet Take 1 tablet by mouth daily.        Marland Kitchen esomeprazole (NEXIUM) 40 MG capsule TAKE ONE CAPSULE BY MOUTH EVERY DAY  90 capsule  3  . FLUoxetine (PROZAC) 20 MG capsule TAKE ONE CAPSULE BY MOUTH EVERY DAY  90 capsule  3  . fluticasone (FLONASE) 50 MCG/ACT nasal spray Place 2 sprays into the nose daily.  48 g  1  . gabapentin (NEURONTIN) 100 MG capsule TAKE ONE CAPSULE BY MOUTH 4 TIMES A DAY  120 capsule  3  . Glucosamine 500 MG TABS Take 1 tablet by mouth daily.        Marland Kitchen  hydrochlorothiazide (HYDRODIURIL) 25 MG tablet TAKE 1 TABLET EVERY DAY  90 tablet  3  . Loratadine (CLARITIN) 10 MG CAPS Take 1 capsule by mouth as needed.        . traMADol (ULTRAM) 50 MG tablet Take 1 tablet (50 mg total) by mouth every 6 (six) hours as needed for pain.  40 tablet  3   No facility-administered medications prior to visit.    Review of Systems:   GEN: No fevers, chills. Nontoxic. Primarily MSK c/o today. MSK: Detailed in the HPI GI: tolerating PO intake without difficulty Neuro: No numbness, parasthesias, or tingling associated. Otherwise the pertinent positives of the ROS are noted above.    Physical Examination: BP 140/70  Pulse 71  Temp(Src) 98.2 F (36.8 C) (Oral)  Ht 5' 1.5" (1.562 m)  Wt 155 lb (70.308 kg)  BMI 28.82 kg/m2  Ideal Body Weight: Weight in (lb) to have BMI = 25: 134.2   GEN: WDWN, NAD,  Non-toxic, Alert & Oriented x 3 HEENT: Atraumatic, Normocephalic.  Ears and Nose: No external deformity. EXTR: No clubbing/cyanosis/edema NEURO: Normal gait, antalgia PSYCH: Normally interactive. Conversant. Not depressed or anxious appearing.  Calm demeanor.   Knee:  R ROM: 0-120 Effusion: minimal Echymosis or edema: none Patellar tendon NT Painful PLICA: neg Patellar grind: + and pain Medial and lateral patellar facet loading: negative medial and lateral joint lines: medial joint line notably tender to palpation Mcmurray's mild pain only, not mechanical Flexion-pinch pos Varus and valgus stress: stable Lachman: neg Ant and Post drawer: neg Hip abduction, IR, ER: WNL Hip flexion str: 5/5 Hip abd: 5/5 Quad: 5/5 VMO atrophy:No Hamstring concentric and eccentric: 5/5   X-rays: AP, Lateral, Sunrise views Indication: knee pain Findings:  Joint spaces fairly well preserved. No occult fracture or dislocation.  Assessment and Plan:  Knee pain, left  Osteoarthritis of left knee - Plan: DG Knee AP/LAT W/Sunrise Left  Meniscal contusion vs. Tear. Potentially OA flare - clinically with OA, views taken were not WB, so limited utility for OA assessment  Ulcer - no nsaids  F/u in 2-3 weeks if not better.   Knee Injection, L Patient verbally consented to procedure. Risks (including potential rare risk of infection), benefits, and alternatives explained. Sterilely prepped with Chloraprep. Ethyl cholride used for anesthesia. 8 cc Lidocaine 1% mixed with 2 cc of Depo-Medrol 40 mg injected using the anterolateral approach without difficulty. No complications with procedure and tolerated well. Patient had decreased pain post-injection.   Orders Today:  Orders Placed This Encounter  Procedures  . DG Knee AP/LAT W/Sunrise Left    Standing Status: Future     Number of Occurrences: 1     Standing Expiration Date: 01/05/2014    Order Specific Question:  Reason for exam:    Answer:  knee  pain    Order Specific Question:  Preferred imaging location?    Answer:  [facility]    Updated Medication List: (Includes new medications, updates to list, dose adjustments) No orders of the defined types were placed in this encounter.    Medications Discontinued: There are no discontinued medications.    Signed,  Elpidio Galea. Demetra Moya, MD, CAQ Sports Medicine  [employer] at [facility] [address] [place] [address] Phone: [phone] Fax: [phone]

## 2012-11-16 ENCOUNTER — Other Ambulatory Visit: Payer: Self-pay | Admitting: Family Medicine

## 2012-11-16 NOTE — Telephone Encounter (Signed)
Last office visit 11/05/2012 with Dr. Patsy Lager.  Ok to refill?

## 2012-12-16 ENCOUNTER — Ambulatory Visit (INDEPENDENT_AMBULATORY_CARE_PROVIDER_SITE_OTHER): Payer: Medicare PPO | Admitting: Family Medicine

## 2012-12-16 ENCOUNTER — Encounter: Payer: Self-pay | Admitting: Family Medicine

## 2012-12-16 VITALS — BP 120/62 | HR 65 | Temp 98.2°F | Ht 61.5 in | Wt 157.0 lb

## 2012-12-16 DIAGNOSIS — IMO0002 Reserved for concepts with insufficient information to code with codable children: Secondary | ICD-10-CM

## 2012-12-16 DIAGNOSIS — M1710 Unilateral primary osteoarthritis, unspecified knee: Secondary | ICD-10-CM

## 2012-12-16 DIAGNOSIS — M25562 Pain in left knee: Secondary | ICD-10-CM

## 2012-12-16 DIAGNOSIS — M25569 Pain in unspecified knee: Secondary | ICD-10-CM

## 2012-12-16 NOTE — Progress Notes (Signed)
Date:  12/16/2012   Name:  Ashley Ryan   DOB:  [birth year]   MRN:  [id number] Gender: female Age: 67 y.o.  Primary Physician:  Kerby Nora, MD   Chief Complaint: Knee Pain   Subjective:   History of Present Illness:  Ashley Ryan is a 67 y.o. pleasant patient who presents with the following:  Very pleasant patient who I have known now for a number of years. She has been having bilateral knee pain, but her left knee has been significantly worse compared to the right. We have felt that this is primarily due to arthritic changes, but on her most recent x-rays there were less impressive than anticipated. She has had some response to corticosteroids, but it has had a diminishing return. She did have an injection of some Synvisc a couple of years ago, she had a great response to this. Her left knee has become more and more debilitating  Patient Active Problem List   Diagnosis Date Noted  . Osteoporosis, unspecified 04/30/2012  . De Quervain's tenosynovitis, left 07/05/2011  . DIABETIC PERIPHERAL NEUROPATHY 12/27/2009  . Osteoarthritis, bilateral knees 11/23/2009  . DIABETES MELLITUS, TYPE II 02/03/2009  . HYPERTRIGLYCERIDEMIA 02/03/2009  . COMMON MIGRAINE 11/03/2008  . ESSENTIAL HYPERTENSION, BENIGN 11/03/2008  . ALLERGIC RHINITIS DUE TO POLLEN 11/03/2008  . HIATAL HERNIA WITH REFLUX 11/03/2008  . DIVERTICULOSIS OF COLON 11/03/2008  . MURMUR 11/03/2008  . GASTRIC ULCER, HX OF 11/03/2008    Past Medical History  Diagnosis Date  . Arthritis   . Chronic headache   . Diverticulosis of colon   . Hypertension     Past Surgical History  Procedure Laterality Date  . Cholecystectomy    . Abdominal hysterectomy      one ovary remains    History   Social History  . Marital Status: Married    Spouse Name: N/A    Number of Children: 2  . Years of Education: N/A   Occupational History  . retired cna    Social History Main Topics  . Smoking status: Never Smoker   .  Smokeless tobacco: Never Used  . Alcohol Use: No  . Drug Use: No  . Sexual Activity: Not on file   Other Topics Concern  . Not on file   Social History Narrative   Drinks sweet tea, diet sodas 4 a day   No living will, full code (reviewed 2014)    No family history on file.  Allergies  Allergen Reactions  . Sulfonamide Derivatives     Medication list has been reviewed and updated.  Review of Systems:  GEN: No fevers, chills. Nontoxic. Primarily MSK c/o today. MSK: Detailed in the HPI GI: tolerating PO intake without difficulty Neuro: No numbness, parasthesias, or tingling associated. Otherwise the pertinent positives of the ROS are noted above.   Objective:   Physical Examination: BP 120/62  Pulse 65  Temp(Src) 98.2 F (36.8 C) (Oral)  Ht 5' 1.5" (1.562 m)  Wt 157 lb (71.215 kg)  BMI 29.19 kg/m2  Ideal Body Weight: Weight in (lb) to have BMI = 25: 134.2   GEN: WDWN, NAD, Non-toxic, Alert & Oriented x 3 HEENT: Atraumatic, Normocephalic.  Ears and Nose: No external deformity. EXTR: No clubbing/cyanosis/edema NEURO: notable antalgia PSYCH: Normally interactive. Conversant. Not depressed or anxious appearing.  Calm demeanor.   On the left knee, the patient has full extension. Action to 115. Notable tenderness on the medial and lateral joint lines. Nontender at the patellar tendon.  There is some mild crepitus. Stable MCL and LCL. Anterior and posterior drawer testing is normal. Lachman is negative. McMurray's test produces pain. Bounce home test produces pain. Flexion pinched test is markedly positive.  Dg Knee Ap/lat W/sunrise Left  11/06/2012   CLINICAL DATA:  Knee pain.  EXAM: DG KNEE - 3 VIEWS  COMPARISON:  None.  FINDINGS: There is no evidence of fracture, dislocation, or joint effusion. There is no evidence of arthropathy or other focal bone abnormality. Soft tissues are unremarkable.  IMPRESSION: Negative.   Electronically Signed   By: Davonna Belling M.D.   On:  11/06/2012 01:17  -- these are [facility] films.  Assessment & Plan:    Left knee pain - Plan: MR Knee Left  Wo Contrast  Osteoarthritis, bilateral knees - Plan: MR Knee Left  Wo Contrast  Clinical concern for potential meniscal pathology in a patient who has been treated for a long time conservatively, but she is doing poorly. Limited by pain. Evaluate for meniscal tear. Evaluate for potential loose body. If there is discrete meniscal tear, then surgical consultation appropriate.  If arthritic changes or greater than anticipated without discrete arrangement, I would recommend hyaluronic acid injections.  Patient Instructions  REFERRAL: GO THE THE FRONT ROOM AT THE ENTRANCE OF OUR CLINIC, NEAR CHECK IN. ASK FOR MARION. SHE WILL HELP YOU SET UP YOUR REFERRAL. DATE: TIME:    Orders Today:  Orders Placed This Encounter  Procedures  . MR Knee Left  Wo Contrast    New medications, updates to list, dose adjustments: No orders of the defined types were placed in this encounter.    Signed,  Elpidio Galea. Hadassah Rana, MD, CAQ Sports Medicine  [facility] at [facility] [address] [place] [address] Phone: [phone] Fax: [phone]  Updated Complete Medication List:   Medication List       This list is accurate as of: 12/16/12  5:44 PM.  Always use your most recent med list.               alendronate 70 MG tablet  Commonly known as:  FOSAMAX  Take 1 tablet (70 mg total) by mouth every 7 (seven) days. Take with a full glass of water on an empty stomach.     amLODipine 5 MG tablet  Commonly known as:  NORVASC  TAKE 1 TABLET EVERY DAY     aspirin 81 MG tablet  Take 81 mg by mouth daily.     atenolol 100 MG tablet  Commonly known as:  TENORMIN  TAKE 1 TABLET (100 MG TOTAL) BY MOUTH DAILY.     CALCIUM 600+D 600-400 MG-UNIT per tablet  Generic drug:  Calcium Carbonate-Vitamin D  Take 1 tablet by mouth daily.     CLARITIN 10 MG Caps  Generic drug:   Loratadine  Take 1 capsule by mouth as needed.     esomeprazole 40 MG capsule  Commonly known as:  NEXIUM  TAKE ONE CAPSULE BY MOUTH EVERY DAY     FLUoxetine 20 MG capsule  Commonly known as:  PROZAC  TAKE ONE CAPSULE BY MOUTH EVERY DAY     fluticasone 50 MCG/ACT nasal spray  Commonly known as:  FLONASE  Place 2 sprays into the nose daily.     gabapentin 100 MG capsule  Commonly known as:  NEURONTIN  TAKE ONE CAPSULE BY MOUTH 4 TIMES A DAY     Glucosamine 500 MG Tabs  Take 1 tablet by mouth  daily.     hydrochlorothiazide 25 MG tablet  Commonly known as:  HYDRODIURIL  TAKE 1 TABLET EVERY DAY     traMADol 50 MG tablet  Commonly known as:  ULTRAM  Take 1 tablet (50 mg total) by mouth every 6 (six) hours as needed for pain.

## 2012-12-16 NOTE — Progress Notes (Signed)
Pre-visit discussion using our clinic review tool. No additional management support is needed unless otherwise documented below in the visit note.  

## 2012-12-16 NOTE — Patient Instructions (Signed)
REFERRAL: GO THE THE FRONT ROOM AT THE ENTRANCE OF OUR CLINIC, NEAR CHECK IN. ASK FOR Ashley Ryan. SHE WILL HELP YOU SET UP YOUR REFERRAL. DATE: TIME:  

## 2012-12-23 ENCOUNTER — Other Ambulatory Visit: Payer: Medicare PPO

## 2012-12-27 ENCOUNTER — Ambulatory Visit
Admission: RE | Admit: 2012-12-27 | Discharge: 2012-12-27 | Disposition: A | Discharge status: Home / Self Care | Payer: Medicare PPO | Source: Ambulatory Visit | Attending: Family Medicine | Admitting: Family Medicine

## 2012-12-27 DIAGNOSIS — M25562 Pain in left knee: Secondary | ICD-10-CM

## 2012-12-27 DIAGNOSIS — M1710 Unilateral primary osteoarthritis, unspecified knee: Secondary | ICD-10-CM

## 2012-12-28 ENCOUNTER — Other Ambulatory Visit: Payer: Self-pay | Admitting: Family Medicine

## 2012-12-28 ENCOUNTER — Telehealth: Payer: Self-pay | Admitting: *Deleted

## 2012-12-28 DIAGNOSIS — M1712 Unilateral primary osteoarthritis, left knee: Secondary | ICD-10-CM

## 2012-12-28 DIAGNOSIS — S83282A Other tear of lateral meniscus, current injury, left knee, initial encounter: Secondary | ICD-10-CM

## 2012-12-28 NOTE — Telephone Encounter (Signed)
i will call her in the PM to discuss

## 2012-12-28 NOTE — Telephone Encounter (Signed)
Pt has the disc of the MRI she had yesterday, she wants to know whe she is supposed to do with it? Is she to bring it to you, or are you sending her to another Dr and she take it there? Please advise?

## 2013-01-05 ENCOUNTER — Emergency Department: Payer: Self-pay | Admitting: Emergency Medicine

## 2013-03-05 ENCOUNTER — Other Ambulatory Visit: Payer: Self-pay | Admitting: Family Medicine

## 2013-03-11 ENCOUNTER — Other Ambulatory Visit: Payer: Self-pay | Admitting: Family Medicine

## 2013-03-11 NOTE — Telephone Encounter (Signed)
Last office visit 12/16/2012 with Dr. Lorelei Pont.  Ok to refill?

## 2013-03-12 NOTE — Telephone Encounter (Signed)
I will let Dr. B manage her medications chronically.  I have seen her a number of times for arthritis management and procedures, but not primary care.

## 2013-03-25 ENCOUNTER — Other Ambulatory Visit: Payer: Self-pay | Admitting: Family Medicine

## 2013-03-31 ENCOUNTER — Ambulatory Visit: Payer: Medicare PPO | Admitting: Family Medicine

## 2013-04-05 ENCOUNTER — Other Ambulatory Visit: Payer: Self-pay | Admitting: Family Medicine

## 2013-05-12 ENCOUNTER — Other Ambulatory Visit: Payer: Self-pay | Admitting: Family Medicine

## 2013-05-12 NOTE — Telephone Encounter (Signed)
Called in to [facility] as directed.

## 2013-05-12 NOTE — Telephone Encounter (Signed)
Last office visit 12/16/12 for knee pain with Dr. Lorelei Pont, ok to refill?

## 2013-05-28 ENCOUNTER — Telehealth: Payer: Self-pay | Admitting: *Deleted

## 2013-05-28 NOTE — Telephone Encounter (Signed)
In outbox

## 2013-05-28 NOTE — Telephone Encounter (Signed)
Received fax from [employer] for Diabetic Testing Supplies.  Verified with Ms. Villaflor that she requested supplies from this company. Forms completed and placed in Dr. Rometta Emery in box for signature.

## 2013-05-31 NOTE — Telephone Encounter (Signed)
Forms completed and faxed to [facility] [phone].

## 2013-06-01 ENCOUNTER — Other Ambulatory Visit: Payer: Self-pay | Admitting: Family Medicine

## 2013-06-06 ENCOUNTER — Other Ambulatory Visit: Payer: Self-pay | Admitting: Family Medicine

## 2013-06-10 LAB — HM DIABETES FOOT EXAM

## 2013-06-10 LAB — HM DIABETES EYE EXAM

## 2013-06-23 ENCOUNTER — Other Ambulatory Visit: Payer: Self-pay | Admitting: Family Medicine

## 2013-06-23 NOTE — Telephone Encounter (Signed)
Last office visit 01/15/2013 with Dr. Lorelei Pont (knee pain).  Last CPE 03/23/2012.  Not future appointments scheduled.  Ok to refill?

## 2013-06-24 NOTE — Telephone Encounter (Signed)
Have her make appt for CPX with labs prior refill until then.

## 2013-06-24 NOTE — Telephone Encounter (Signed)
CPX scheduled 08/10/2013.

## 2013-06-29 ENCOUNTER — Encounter: Payer: Self-pay | Admitting: Family Medicine

## 2013-06-29 ENCOUNTER — Ambulatory Visit: Payer: Medicare PPO | Admitting: Family Medicine

## 2013-06-29 ENCOUNTER — Encounter (INDEPENDENT_AMBULATORY_CARE_PROVIDER_SITE_OTHER): Payer: Medicare PPO | Admitting: Family Medicine

## 2013-06-29 NOTE — Progress Notes (Signed)
No charge. Synvisc was not in stock. This encounter was created in error - please disregard.

## 2013-06-29 NOTE — Progress Notes (Signed)
Pre visit review using our clinic review tool, if applicable. No additional management support is needed unless otherwise documented below in the visit note. 

## 2013-07-01 ENCOUNTER — Other Ambulatory Visit: Payer: Self-pay | Admitting: Family Medicine

## 2013-07-01 NOTE — Telephone Encounter (Signed)
Called to [facility]. 

## 2013-07-02 ENCOUNTER — Ambulatory Visit (INDEPENDENT_AMBULATORY_CARE_PROVIDER_SITE_OTHER): Payer: Medicare PPO | Admitting: Family Medicine

## 2013-07-02 ENCOUNTER — Encounter: Payer: Self-pay | Admitting: Family Medicine

## 2013-07-02 VITALS — BP 120/60 | HR 73 | Temp 98.0°F | Ht 61.5 in | Wt 157.8 lb

## 2013-07-02 DIAGNOSIS — M1710 Unilateral primary osteoarthritis, unspecified knee: Secondary | ICD-10-CM

## 2013-07-02 DIAGNOSIS — IMO0002 Reserved for concepts with insufficient information to code with codable children: Secondary | ICD-10-CM

## 2013-07-02 DIAGNOSIS — M1712 Unilateral primary osteoarthritis, left knee: Secondary | ICD-10-CM

## 2013-07-02 NOTE — Progress Notes (Signed)
Pre visit review using our clinic review tool, if applicable. No additional management support is needed unless otherwise documented below in the visit note. 

## 2013-07-02 NOTE — Progress Notes (Signed)
    Procedure Only:  Long-standing Knee OA.  EXAM:  MRI OF THE LEFT KNEE WITHOUT CONTRAST  TECHNIQUE:  Multiplanar, multisequence MR imaging of the knee was performed. No  intravenous contrast was administered.  COMPARISON: Radiographs 11/05/2012  FINDINGS:  MENISCI  Medial meniscus: Complex tear involving the mid body and midbody  posterior horn junction region of the meniscus with radial, free  edge and articular surface components. Suspect a flipped meniscal  fragment in the medial gutter anteriorly.  Lateral meniscus: Intra substance degenerative changes without  discrete tear  LIGAMENTS  Cruciates: Intact.  Collaterals: Intact. Moderate MCL and pes anserine bursitis.  CARTILAGE  Patellofemoral: Mild to moderate degenerative chondrosis.  Medial: Advanced degenerative chondrosis with areas of full  thickness cartilage loss, joint space narrowing and osteophytic  spurring.  Lateral: Mild degenerative chondrosis.  Joint: Moderate-sized joint effusion with synovial thickening and  patellar plica.  Popliteal Fossa: Moderate to large Baker's cyst.  Extensor Mechanism: The patella retinacular structures are intact  and the quadriceps patellar tendons are intact.  Bones: No acute bony findings. Marrow edema in the medial tibia and  femur is likely a stress reaction from the meniscal tear and medial  compartment degenerative changes.  IMPRESSION:  1. Complex tear involving the midbody and midbody posterior horn  junction region of the medial meniscus.  2. Degenerative changes involving the lateral meniscus without  discrete tear.  3. Intact ligamentous structures and no acute bony findings.  4. Tricompartmental degenerative changes most notable in the medial  compartment.  5. Moderate-sized joint effusion with synovitis and patellar plica.  There is also a moderate to large Baker's cyst.  Electronically Signed  By: Kalman Jewels M.D.  On: 12/27/2012 13:35   Knee  Injection: Synvisc-One, LEFT Patient verbally consented to procedure. Risks (including infection), benefits, and alternatives explained. Sterilely prepped with Chloraprep. Ethyl cholride used for anesthesia, then 7 cc of Lidocaine 1% used for anesthesia in the anteromedial position. Reprepped with Chloraprep.  Anteromedial approach used to inject joint without difficulty, injected with Synvisc-One, 6 mL. No complications with procedure and tolerated well.  Signed,  [name]. Maisley Hainsworth, MD, [place]

## 2013-07-07 ENCOUNTER — Other Ambulatory Visit: Payer: Self-pay | Admitting: Family Medicine

## 2013-07-29 ENCOUNTER — Other Ambulatory Visit: Payer: Self-pay | Admitting: Family Medicine

## 2013-07-29 NOTE — Telephone Encounter (Signed)
Last office visit 07/02/2013 with Dr. Lorelei Pont.  Last refilled 03/12/2013 for #120 with 3 refills.  Ok to refill?

## 2013-08-03 ENCOUNTER — Other Ambulatory Visit: Payer: Medicare PPO

## 2013-08-03 ENCOUNTER — Telehealth: Payer: Self-pay | Admitting: Family Medicine

## 2013-08-03 DIAGNOSIS — E781 Pure hyperglyceridemia: Secondary | ICD-10-CM

## 2013-08-03 DIAGNOSIS — I10 Essential (primary) hypertension: Secondary | ICD-10-CM

## 2013-08-03 DIAGNOSIS — E119 Type 2 diabetes mellitus without complications: Secondary | ICD-10-CM

## 2013-08-03 NOTE — Telephone Encounter (Signed)
Message copied by Eliezer Lofts E on Tue Aug 03, 2013  8:07 AM ------      Message from: Ellamae Sia      Created: Mon Aug 02, 2013 11:39 AM      Regarding: Lab orders for Tuesday, 7.14.15       Patient is scheduled for CPX labs, please order future labs, Thanks , Terri            This patient is due for the Diabetic Bundle tests, A1C, LDL ------

## 2013-08-05 ENCOUNTER — Other Ambulatory Visit (INDEPENDENT_AMBULATORY_CARE_PROVIDER_SITE_OTHER): Payer: Medicare PPO

## 2013-08-05 DIAGNOSIS — E119 Type 2 diabetes mellitus without complications: Secondary | ICD-10-CM

## 2013-08-05 DIAGNOSIS — E1149 Type 2 diabetes mellitus with other diabetic neurological complication: Secondary | ICD-10-CM

## 2013-08-05 DIAGNOSIS — I10 Essential (primary) hypertension: Secondary | ICD-10-CM

## 2013-08-05 DIAGNOSIS — E781 Pure hyperglyceridemia: Secondary | ICD-10-CM

## 2013-08-05 LAB — COMPREHENSIVE METABOLIC PANEL
ALK PHOS: 55 U/L (ref 39–117)
ALT: 13 U/L (ref 0–35)
AST: 18 U/L (ref 0–37)
Albumin: 3.6 g/dL (ref 3.5–5.2)
BUN: 14 mg/dL (ref 6–23)
CALCIUM: 9.3 mg/dL (ref 8.4–10.5)
CO2: 26 mEq/L (ref 19–32)
Chloride: 102 mEq/L (ref 96–112)
Creatinine, Ser: 0.6 mg/dL (ref 0.4–1.2)
GFR: 104.99 mL/min (ref >60.00)
GLUCOSE: 105 mg/dL — AB (ref 70–99)
Potassium: 3.4 mEq/L — ABNORMAL LOW (ref 3.5–5.1)
SODIUM: 137 meq/L (ref 135–145)
TOTAL PROTEIN: 6.7 g/dL (ref 6.0–8.3)
Total Bilirubin: 0.3 mg/dL (ref 0.2–1.2)

## 2013-08-05 LAB — LIPID PANEL
CHOL/HDL RATIO: 3
Cholesterol: 147 mg/dL (ref 0–200)
HDL: 49.8 mg/dL (ref >39.00)
LDL CALC: 69 mg/dL (ref 0–99)
NONHDL: 97.2
Triglycerides: 139 mg/dL (ref 0.0–149.0)
VLDL: 27.8 mg/dL (ref 0.0–40.0)

## 2013-08-05 LAB — HEMOGLOBIN A1C: Hgb A1c MFr Bld: 6.5 % (ref 4.6–6.5)

## 2013-08-10 ENCOUNTER — Ambulatory Visit (INDEPENDENT_AMBULATORY_CARE_PROVIDER_SITE_OTHER): Payer: Medicare PPO | Admitting: Family Medicine

## 2013-08-10 ENCOUNTER — Telehealth: Payer: Self-pay | Admitting: Radiology

## 2013-08-10 ENCOUNTER — Ambulatory Visit: Payer: Medicare PPO

## 2013-08-10 ENCOUNTER — Encounter (HOSPITAL_COMMUNITY)
Admission: RE | Admit: 2013-08-10 | Discharge: 2013-08-10 | Disposition: A | Discharge status: Home / Self Care | Payer: Medicare PPO | Source: Ambulatory Visit | Attending: Family Medicine | Admitting: Family Medicine

## 2013-08-10 ENCOUNTER — Encounter: Payer: Self-pay | Admitting: Family Medicine

## 2013-08-10 VITALS — BP 110/60 | HR 76 | Temp 98.3°F | Ht 60.0 in | Wt 151.5 lb

## 2013-08-10 DIAGNOSIS — E119 Type 2 diabetes mellitus without complications: Secondary | ICD-10-CM

## 2013-08-10 DIAGNOSIS — Z86010 Personal history of colonic polyps: Secondary | ICD-10-CM

## 2013-08-10 DIAGNOSIS — R7989 Other specified abnormal findings of blood chemistry: Secondary | ICD-10-CM

## 2013-08-10 DIAGNOSIS — R0609 Other forms of dyspnea: Secondary | ICD-10-CM

## 2013-08-10 DIAGNOSIS — M810 Age-related osteoporosis without current pathological fracture: Secondary | ICD-10-CM

## 2013-08-10 DIAGNOSIS — R5381 Other malaise: Secondary | ICD-10-CM

## 2013-08-10 DIAGNOSIS — R0602 Shortness of breath: Secondary | ICD-10-CM | POA: Insufficient documentation

## 2013-08-10 DIAGNOSIS — R5383 Other fatigue: Secondary | ICD-10-CM

## 2013-08-10 DIAGNOSIS — D649 Anemia, unspecified: Secondary | ICD-10-CM | POA: Diagnosis not present

## 2013-08-10 DIAGNOSIS — Z0000 Encounter for general adult medical examination without abnormal findings: Secondary | ICD-10-CM

## 2013-08-10 DIAGNOSIS — E781 Pure hyperglyceridemia: Secondary | ICD-10-CM

## 2013-08-10 DIAGNOSIS — R0989 Other specified symptoms and signs involving the circulatory and respiratory systems: Secondary | ICD-10-CM

## 2013-08-10 DIAGNOSIS — I10 Essential (primary) hypertension: Secondary | ICD-10-CM

## 2013-08-10 LAB — CBC WITH DIFFERENTIAL/PLATELET
BASOS ABS: 0 10*3/uL (ref 0.0–0.1)
Basophils Relative: 0.3 % (ref 0.0–3.0)
Eosinophils Absolute: 0 10*3/uL (ref 0.0–0.7)
Eosinophils Relative: 0.1 % (ref 0.0–5.0)
HCT: 20.3 % — CL (ref 36.0–46.0)
Hemoglobin: 6.4 g/dL — CL (ref 12.0–15.0)
LYMPHS PCT: 10.2 % — AB (ref 12.0–46.0)
Lymphs Abs: 1.2 10*3/uL (ref 0.7–4.0)
MCHC: 31.4 g/dL (ref 30.0–36.0)
MCV: 69.7 fl — ABNORMAL LOW (ref 78.0–100.0)
MONOS PCT: 9.9 % (ref 3.0–12.0)
Monocytes Absolute: 1.1 10*3/uL — ABNORMAL HIGH (ref 0.1–1.0)
NEUTROS ABS: 9.1 10*3/uL — AB (ref 1.4–7.7)
NEUTROS PCT: 79.5 % — AB (ref 43.0–77.0)
Platelets: 316 10*3/uL (ref 150.0–400.0)
RBC: 2.91 Mil/uL — ABNORMAL LOW (ref 3.87–5.11)
RDW: 18.6 % — AB (ref 11.5–15.5)
WBC: 11.4 10*3/uL — ABNORMAL HIGH (ref 4.0–10.5)

## 2013-08-10 LAB — ABO/RH: ABO/RH(D): A POS

## 2013-08-10 LAB — TSH: TSH: 1.99 u[IU]/mL (ref 0.35–4.50)

## 2013-08-10 LAB — VITAMIN B12: VITAMIN B 12: 280 pg/mL (ref 211–911)

## 2013-08-10 LAB — VITAMIN D 25 HYDROXY (VIT D DEFICIENCY, FRACTURES): VITD: 35.41 ng/mL

## 2013-08-10 NOTE — Assessment & Plan Note (Addendum)
No clear abnormality on pulm or cardiac exam other than stable murmur.  Will eval with labs for anemia, thyroid isses. ? Possibly also at least partially due to deconditioning and recent viral illness.   Labs returned with Hg 6.4!! Final not back yet. Awaiting MCV and other labs. Hx of gastric ulcer. No current GI bleeding at OV noted, has noted dark  black stool in last week. Added on iron panel to labs. Pt will pick up stool cards to determine if blood loss in GI track.  Referral has been made for transfusion at short stay ASAP.  GI MD Dr. Eugenia Pancoast

## 2013-08-10 NOTE — Progress Notes (Signed)
Pre visit review using our clinic review tool, if applicable. No additional management support is needed unless otherwise documented below in the visit note. 

## 2013-08-10 NOTE — Telephone Encounter (Signed)
[facility] called critical Hgb - 6.4, Hct - 20.3. Results given to Dr Diona Browner

## 2013-08-10 NOTE — Progress Notes (Signed)
HPI  I have personally reviewed the Medicare Annual Wellness questionnaire and have noted  1. The patient's medical and social history  2. Their use of alcohol, tobacco or illicit drugs  3. Their current medications and supplements  4. The patient's functional ability including ADL's, fall risks, home safety risks and hearing or visual  impairment.  5. Diet and physical activities  6. Evidence for depression or mood disorders  The patients weight, height, BMI and visual acuity have been recorded in the chart  I have made referrals, counseling and provided education to the patient based review of the above and I have provided the pt with a written personalized care plan for preventive services.    She had a cold (cough and congestion) last week. She has noted  she has had no energy in several weeks.. She report shortness of breath (most with walking), occ wheezing.  No fever.  Osteoarthritis in knee, uses tramadol occ. Synvisc with Dr. Loletha Grayer did not help. Next step is knee replacement.  Walking with cane.   Diabetes: Well controlled on no medication.  Lab Results  Component Value Date   HGBA1C 6.5 08/05/2013  Using medications without difficulties:None  Hypoglycemic episodes:None  Hyperglycemic episodes:NONE  Feet problems: DM neuropathy: improved on gabapentin, takes one in AM and 3 tabs at night.  Moderate control. Blood Sugars averaging: Has not checking in a while Eye exam within last year: yes.   Hypertension: At goal < 130/80 on amlodipine, HCTZ, atenolol   BP Readings from Last 3 Encounters:  08/10/13 110/60  07/02/13 120/60  06/29/13 128/70  Using medication without problems or lightheadedness: None  Chest pain with exertion:None  Edema:Rarely  Short of breath:Yes, see above Average home BPs: not checking  Other issues:   Elevated Cholesterol: LDL  at goal <100, triglycerides improved. On no med.  Lab Results  Component Value Date   CHOL 147 08/05/2013   HDL 49.80  08/05/2013   LDLCALC 69 08/05/2013   LDLDIRECT 102.5 07/21/2009   TRIG 139.0 08/05/2013   CHOLHDL 3 08/05/2013  Diet compliance: Good  Exercise: No dedicated exercise, back at work moving more  Other complaints:    Does history of gastric ulcer years ago.  On nexium daily.   Peripheral neuropathy: Still having nighttime pain.  Wt Readings from Last 3 Encounters:  08/10/13 151 lb 8 oz (68.72 kg)  07/02/13 157 lb 12 oz (71.555 kg)  06/29/13 158 lb (71.668 kg)    Review of Systems  Constitutional: Negative for fever, fatigue and unexpected weight change.  HENT: Negative for ear pain, congestion, sore throat, sneezing, trouble swallowing and sinus pressure.  Eyes: Negative for pain and itching.  Respiratory: Negative for cough, shortness of breath and wheezing.  Cardiovascular: Negative for chest pain, palpitations and leg swelling.  Gastrointestinal: Negative for nausea, abdominal pain, diarrhea, constipation and blood in stool.  Genitourinary: Negative for dysuria, hematuria, vaginal discharge, difficulty urinating and menstrual problem.  Skin: Negative for rash.  Neurological: Negative for syncope, weakness, light-headedness, numbness and headaches.  Psychiatric/Behavioral: Negative for confusion and dysphoric mood. The patient is not nervous/anxious.  Objective:   Physical Exam  Constitutional: Vital signs are normal. She appears well-developed and well-nourished. She is cooperative. Non-toxic appearance. She does not appear ill. No distress.  HENT:  Head: Normocephalic.  Right Ear: Hearing, tympanic membrane, external ear and ear canal normal.  Left Ear: Hearing, tympanic membrane, external ear and ear canal normal.  Nose: Nose normal.  Eyes: Conjunctivae, EOM  and lids are normal. Pupils are equal, round, and reactive to light. No foreign bodies found.  Neck: Trachea normal and normal range of motion. Neck supple. Carotid bruit is not present. No mass and no thyromegaly present.   Cardiovascular: Normal rate, regular rhythm, S1 normal, S2 normal, normal heart sounds and intact distal pulses. Exam reveals no gallop. 2/6 sys murmur greatest at RUSB Pulmonary/Chest: Effort normal and breath sounds normal. No respiratory distress. She has no wheezes. She has no rhonchi. She has no rales.  Abdominal: Soft. Normal appearance and bowel sounds are normal. She exhibits no distension, no fluid wave, no abdominal bruit and no mass. There is no hepatosplenomegaly. There is no tenderness. There is no rebound, no guarding and no CVA tenderness. No hernia. Hernia confirmed negative in the right inguinal area and confirmed negative in the left inguinal area.  Genitourinary: Vagina normal. No breast swelling, tenderness, discharge or bleeding. No labial fusion. There is no rash, tenderness, lesion or injury on the right labia. There is no rash, tenderness, lesion or injury on the left labia. Right adnexum displays no mass, no tenderness and no fullness. Left adnexum displays no mass, no tenderness and no fullness.  Lymphadenopathy:  She has no cervical adenopathy.  She has no axillary adenopathy.  Right: No inguinal adenopathy present.  Left: No inguinal adenopathy present.  Neurological: She is alert. She has normal strength. No cranial nerve deficit or sensory deficit.  Skin: Skin is warm, dry and intact. No rash noted.  Psychiatric: Her speech is normal and behavior is normal. Judgment normal. Her mood appears not anxious. Cognition and memory are normal. She does not exhibit a depressed mood.  Assessment & Plan:   The patient's preventative maintenance and recommended screening tests for an annual wellness exam were reviewed in full today.  Brought up to date unless services declined.   Vaccines: uptodate td and PNA, refused shingles, not covered, consider prevnar. DEXA: osteoporosis 04/2012, off fosamax, stopped 2 weeks ago for tounge soreness. Plans to retry. Colon: Due , nml 2003,   Dr. Eugenia Pancoast  Mammo: nml 04/2012, Due, sister breast cancer age 29.  PAP/DVE: partial hysterectomy, no pap smear or DVE required,. No family history of ovarian cancer.

## 2013-08-10 NOTE — Telephone Encounter (Signed)
Left message with pt. Please continue to try to reach.

## 2013-08-10 NOTE — Assessment & Plan Note (Signed)
Well controlled on no med. 

## 2013-08-10 NOTE — Assessment & Plan Note (Signed)
SE to fosamax. Will re-try.

## 2013-08-10 NOTE — Telephone Encounter (Signed)
See OV note for summary of plan.

## 2013-08-10 NOTE — Addendum Note (Signed)
Addended by: Eliezer Lofts E on: 08/10/2013 01:32 PM   Modules accepted: Orders

## 2013-08-10 NOTE — Assessment & Plan Note (Signed)
Well controlled. Continue current medication.  

## 2013-08-10 NOTE — Patient Instructions (Addendum)
Cut back on soda, sweet tea, juice and work on low carb diet. Fasting blood sugar 80-120, too low less than 60. 2 hours after meals goal < 180.  Look into water exercise to be low impact on knee. Consider prevnar.. Return here or get a pharmacy. Restart fosamax, let me know if not tolerated.  Schedule mammogram on your own. Stop at front deslk to set up referral to closer GI MD for colonoscopy.  Stop at lab on way out.

## 2013-08-10 NOTE — Assessment & Plan Note (Signed)
Worsened control. Encouraged exercise, weight loss, healthy eating habits.  Info on diet given.

## 2013-08-11 ENCOUNTER — Encounter (HOSPITAL_COMMUNITY): Payer: Self-pay

## 2013-08-11 ENCOUNTER — Encounter (HOSPITAL_COMMUNITY)
Admission: RE | Admit: 2013-08-11 | Discharge: 2013-08-11 | Disposition: A | Discharge status: Home / Self Care | Payer: Medicare PPO | Source: Ambulatory Visit | Attending: Family Medicine | Admitting: Family Medicine

## 2013-08-11 ENCOUNTER — Other Ambulatory Visit (HOSPITAL_COMMUNITY): Payer: Self-pay | Admitting: Family Medicine

## 2013-08-11 DIAGNOSIS — D649 Anemia, unspecified: Secondary | ICD-10-CM | POA: Diagnosis not present

## 2013-08-11 LAB — FERRITIN: Ferritin: 5.3 ng/mL — ABNORMAL LOW (ref 10.0–291.0)

## 2013-08-11 LAB — PREPARE RBC (CROSSMATCH)

## 2013-08-11 LAB — IBC PANEL
Iron: 8 ug/dL — ABNORMAL LOW (ref 42–145)
SATURATION RATIOS: 1.8 % — AB (ref 20.0–50.0)
Transferrin: 316.1 mg/dL (ref 212.0–360.0)

## 2013-08-11 MED ORDER — SODIUM CHLORIDE 0.9 % IV SOLN
Freq: Once | INTRAVENOUS | Status: AC
  Administered 2013-08-11: 09:00:00 via INTRAVENOUS

## 2013-08-11 NOTE — Discharge Instructions (Signed)

## 2013-08-12 ENCOUNTER — Telehealth: Payer: Self-pay | Admitting: Family Medicine

## 2013-08-12 LAB — TYPE AND SCREEN
ABO/RH(D): A POS
Antibody Screen: NEGATIVE
Unit division: 0
Unit division: 0

## 2013-08-12 NOTE — Telephone Encounter (Signed)
Spoke with Ms. Skelton.  She states her last BM was on Monday and that was after taking a stool softener.  She is concerned about getting the stool cards back to [place].  I advised for patient to continue taking the stool softener daily and make sure she is drinking lots of water.  We don't want her to do the stool cards if she is really having to strain during her BM's due to that might cause a small tear or hemorrhoids and her cards would be positive for the wrong reason.  Will try to collect the cards over the weekend and bring them to the office the first of next week.

## 2013-08-12 NOTE — Telephone Encounter (Signed)
Pt called, completed Blood transfusion 08/11/13 and was to submit stool sample.  However she is having constipation issues she would like to discuss.  Best number to call pt is [phone]

## 2013-08-27 ENCOUNTER — Telehealth: Payer: Self-pay | Admitting: Family Medicine

## 2013-08-27 NOTE — Telephone Encounter (Signed)
Patient returned your call and asked that you call her back at her home number.

## 2013-08-28 DIAGNOSIS — Z86010 Personal history of colonic polyps: Secondary | ICD-10-CM | POA: Insufficient documentation

## 2013-08-30 ENCOUNTER — Other Ambulatory Visit: Payer: Self-pay | Admitting: Family Medicine

## 2013-08-31 ENCOUNTER — Encounter: Payer: Self-pay | Admitting: Nurse Practitioner

## 2013-08-31 ENCOUNTER — Telehealth: Payer: Self-pay

## 2013-08-31 NOTE — Telephone Encounter (Signed)
Spoke with [name] to check the status of her stool cards.  She states she has done two, one yesterday and one today.  Will do the third card tomorrow.  I also verified if patient wanted to go back to her original GI doctor, Dr. Owens Loffler for her colonoscopy.  Patient would like to see same physician.  Will forward to [facility] to get patient scheduled.

## 2013-08-31 NOTE — Telephone Encounter (Signed)
Pt said she got letter from [facility] that HCTZ could be "bad". Pt will bring letter to office on 09/01/13 for Dr Diona Browner to review.

## 2013-08-31 NOTE — Telephone Encounter (Signed)
Noted.. I will review note.

## 2013-08-31 NOTE — Telephone Encounter (Signed)
GI Appt made with Ignacia Bayley on 09/07/13, patient aware.

## 2013-09-02 ENCOUNTER — Other Ambulatory Visit: Payer: Medicare PPO

## 2013-09-02 ENCOUNTER — Other Ambulatory Visit: Payer: Self-pay | Admitting: Family Medicine

## 2013-09-02 DIAGNOSIS — D649 Anemia, unspecified: Secondary | ICD-10-CM

## 2013-09-02 LAB — HEMOCCULT SLIDES (X 3 CARDS)
Fecal Occult Blood: NEGATIVE
OCCULT 1: NEGATIVE
OCCULT 2: NEGATIVE
OCCULT 3: NEGATIVE
OCCULT 4: NEGATIVE
OCCULT 5: NEGATIVE

## 2013-09-07 ENCOUNTER — Other Ambulatory Visit (INDEPENDENT_AMBULATORY_CARE_PROVIDER_SITE_OTHER): Payer: Medicare PPO

## 2013-09-07 ENCOUNTER — Ambulatory Visit (INDEPENDENT_AMBULATORY_CARE_PROVIDER_SITE_OTHER): Payer: Medicare PPO | Admitting: Nurse Practitioner

## 2013-09-07 ENCOUNTER — Encounter: Payer: Self-pay | Admitting: Nurse Practitioner

## 2013-09-07 VITALS — BP 130/80 | HR 72 | Ht 61.5 in | Wt 154.8 lb

## 2013-09-07 DIAGNOSIS — K921 Melena: Secondary | ICD-10-CM

## 2013-09-07 DIAGNOSIS — D509 Iron deficiency anemia, unspecified: Secondary | ICD-10-CM

## 2013-09-07 LAB — CBC WITH DIFFERENTIAL/PLATELET
BASOS PCT: 1 % (ref 0.0–3.0)
Basophils Absolute: 0.1 10*3/uL (ref 0.0–0.1)
EOS ABS: 0.2 10*3/uL (ref 0.0–0.7)
EOS PCT: 2.7 % (ref 0.0–5.0)
HEMATOCRIT: 32.1 % — AB (ref 36.0–46.0)
HEMOGLOBIN: 10.2 g/dL — AB (ref 12.0–15.0)
LYMPHS ABS: 1.5 10*3/uL (ref 0.7–4.0)
Lymphocytes Relative: 24.5 % (ref 12.0–46.0)
MCHC: 31.7 g/dL (ref 30.0–36.0)
MCV: 73.4 fl — AB (ref 78.0–100.0)
Monocytes Absolute: 0.6 10*3/uL (ref 0.1–1.0)
Monocytes Relative: 10.3 % (ref 3.0–12.0)
NEUTROS ABS: 3.7 10*3/uL (ref 1.4–7.7)
NEUTROS PCT: 61.5 % (ref 43.0–77.0)
Platelets: 246 10*3/uL (ref 150.0–400.0)
RBC: 4.37 Mil/uL (ref 3.87–5.11)
RDW: 22.9 % — ABNORMAL HIGH (ref 11.5–15.5)
WBC: 5.9 10*3/uL (ref 4.0–10.5)

## 2013-09-07 MED ORDER — NA SULFATE-K SULFATE-MG SULF 17.5-3.13-1.6 GM/177ML PO SOLN: 1.0000 | Freq: Once | ORAL | Status: DC

## 2013-09-07 NOTE — Patient Instructions (Signed)
Please go to the basement level to have your labs drawn.  You have been scheduled for an endoscopy and colonoscopy. Please follow the written instructions given to you at your visit today. We have given you a sample prep. You will not need to get it at the pharmacy. If you use inhalers (even only as needed), please bring them with you on the day of your procedure. Your physician has requested that you go to www.startemmi.com and enter the access code given to you at your visit today. This web site gives a general overview about your procedure. However, you should still follow specific instructions given to you by our office regarding your preparation for the procedure.

## 2013-09-07 NOTE — Progress Notes (Signed)
HPI : Patient is a 68 year old female known to Dr. Ardis Hughs, evaluated in 2010 for dysphagia. Upper endoscopy with dilation done October 2010 revealed distal esophageal stenosis and a 5-6 cm hiatal hernia . Her last colonoscopy was done at [facility] July 2003. Findings included nonbleeding internal hemorrhoids, exam otherwise normal   Patient is referred by her PCP for evaluation of severe anemia. She has been extremely tired. Hemoglobin found to be only 6.4 on recent labs. Patient recalls having black stools a few days last month. No abdominal pain. No nausea or vomiting. She takes a baby aspirin, no other NSAIDs and is on chronic PPI therapy. Other than black stools last month, no GI complaints. Hemoccults negative 09/02/13  Past Medical History  Diagnosis Date  . Arthritis   . Chronic headache   . Diverticulosis of colon   . Hypertension     Family History  Problem Relation Age of Onset  . Breast cancer    . Stomach cancer    . Diabetes    . Heart disease     History  Substance Use Topics  . Smoking status: Never Smoker   . Smokeless tobacco: Never Used  . Alcohol Use: No   Current Outpatient Prescriptions  Medication Sig Dispense Refill  . alendronate (FOSAMAX) 70 MG tablet TAKE 1 TABLET BY MOUTH ONCE A WEEK  4 tablet  11  . amLODipine (NORVASC) 5 MG tablet TAKE 1 TABLET EVERY DAY  90 tablet  0  . aspirin 81 MG tablet Take 81 mg by mouth daily.        Marland Kitchen atenolol (TENORMIN) 100 MG tablet TAKE 1 TABLET BY MOUTH EVERY DAY  30 tablet  5  . Calcium Carbonate-Vitamin D (CALCIUM 600+D) 600-400 MG-UNIT per tablet Take 1 tablet by mouth daily.        Marland Kitchen FLUoxetine (PROZAC) 20 MG capsule TAKE ONE CAPSULE BY MOUTH EVERY DAY  90 capsule  1  . fluticasone (FLONASE) 50 MCG/ACT nasal spray PLACE 2 SPRAYS INTO THE NOSE DAILY.  16 g  1  . gabapentin (NEURONTIN) 100 MG capsule TAKE ONE CAPSULE BY MOUTH 4 TIMES A DAY  120 capsule  3  . Glucosamine 500 MG TABS Take 1  tablet by mouth daily.        . hydrochlorothiazide (HYDRODIURIL) 25 MG tablet TAKE 1 TABLET BY MOUTH EVERY DAY  90 tablet  0  . Loratadine (CLARITIN) 10 MG CAPS Take 1 capsule by mouth as needed.        Marland Kitchen NEXIUM 40 MG capsule TAKE ONE CAPSULE BY MOUTH EVERY DAY  90 capsule  0  . traMADol (ULTRAM) 50 MG tablet TAKE 1 TABLET BY MOUTH EVERY 6 HOURS AS NEEDED KNEE PAIN  40 tablet  0  . Na Sulfate-K Sulfate-Mg Sulf SOLN Take 1 kit by mouth once.  354 mL  0   No current facility-administered medications for this visit.   Allergies  Allergen Reactions  . Sulfonamide Derivatives      Review of Systems: Positive for allergy/sinus trouble, arthritis, fatigue, hearing pros, heart murmur and swelling of feetAll systems reviewed and negative except where noted in HPI.   Physical Exam: BP 130/80  Pulse 72  Ht 5' 1.5" (1.562 m)  Wt 154 lb 12.8 oz (70.217 kg)  BMI 28.78 kg/m2 Constitutional: Pleasant,well-developed, female in no acute distress. HEENT: Normocephalic and atraumatic. Conjunctivae are normal. No scleral icterus. Neck supple.  Cardiovascular: Normal rate, regular rhythm.  Pulmonary/chest:  Effort normal and breath sounds normal. No wheezing, rales or rhonchi. Abdominal: Soft, nondistended, nontender. Bowel sounds active throughout. There are no masses palpable. No hepatomegaly. Rectal: heme negative  Extremities: no edema Lymphadenopathy: No cervical adenopathy noted. Neurological: Alert and oriented to person place and time. Skin: Skin is warm and dry. No rashes noted. Psychiatric: Normal mood and affect. Behavior is normal.   ASSESSMENT AND PLAN:  68-year-old female with severe microcytic anemia, I have no labs for comparison but hgb 6.4 on 7/21. Ferrin is 5.  Patient tells me she received 2 units of blood. She describes black stool last month. For further evaluation patient needs an upper endoscopy at which time small bowel biopsies for celiac can be done if indicated. Given  that she is microcytic and her last colonoscopy was 2003, she also needs a colonoscopy as well. Patient has a known hiatal hernia, rule out Cameron lesions. Rule out erosive disease, especially given use of Fosamax. Rule out colon neoplasm. For further evaluation patient will be scheduled for EGD and colonoscopy. The risks, benefits, and alternatives to EGD and colonoscopy with possible biopsy and possible polypectomy were discussed with the patient and she consents to proceed.  Her primary GI, Dr. Jacobs, doesn't have any openings for procedures for next several weeks. Dr. Kaplan has agreed to do the EGD /colonoscopy later this week. Will recheck a CBC today.          

## 2013-09-08 DIAGNOSIS — D509 Iron deficiency anemia, unspecified: Secondary | ICD-10-CM | POA: Insufficient documentation

## 2013-09-09 ENCOUNTER — Ambulatory Visit (AMBULATORY_SURGERY_CENTER): Payer: Medicare PPO | Admitting: Gastroenterology

## 2013-09-09 ENCOUNTER — Encounter: Payer: Self-pay | Admitting: Gastroenterology

## 2013-09-09 ENCOUNTER — Other Ambulatory Visit: Payer: Self-pay

## 2013-09-09 VITALS — BP 143/82 | HR 66 | Temp 98.0°F | Resp 35 | Ht 61.0 in | Wt 154.0 lb

## 2013-09-09 DIAGNOSIS — D509 Iron deficiency anemia, unspecified: Secondary | ICD-10-CM

## 2013-09-09 DIAGNOSIS — D126 Benign neoplasm of colon, unspecified: Secondary | ICD-10-CM

## 2013-09-09 DIAGNOSIS — K2990 Gastroduodenitis, unspecified, without bleeding: Secondary | ICD-10-CM

## 2013-09-09 DIAGNOSIS — K921 Melena: Secondary | ICD-10-CM

## 2013-09-09 DIAGNOSIS — K2970 Gastritis, unspecified, without bleeding: Secondary | ICD-10-CM

## 2013-09-09 DIAGNOSIS — K648 Other hemorrhoids: Secondary | ICD-10-CM

## 2013-09-09 DIAGNOSIS — K222 Esophageal obstruction: Secondary | ICD-10-CM

## 2013-09-09 HISTORY — PX: COLONOSCOPY W/ POLYPECTOMY: SHX1380

## 2013-09-09 MED ORDER — SODIUM CHLORIDE 0.9 % IV SOLN: 500.0000 mL | INTRAVENOUS | Status: DC

## 2013-09-09 NOTE — Op Note (Signed)
[place]  [address] [place], [id number]   COLONOSCOPY PROCEDURE REPORT  PATIENT: Ashley Ryan, Ashley Ryan.  MR#: [id number] BIRTHDATE: 12-Jun-1945 , 17  yrs. old GENDER: Female ENDOSCOPIST: Inda Castle, MD REFERRED BY:Amy Cletis Athens, M.D. PROCEDURE DATE:  09/09/2013 PROCEDURE:   Colonoscopy with snare polypectomy First Screening Colonoscopy - Avg.  risk and is 50 yrs.  old or older - No.  Prior Negative Screening - Now for repeat screening. 10 or more years since last screening  History of Adenoma - Now for follow-up colonoscopy & has been > or = to 3 yrs.  N/A  Polyps Removed Today? Yes. ASA CLASS:   Class II INDICATIONS:Iron Deficiency Anemia. MEDICATIONS: MAC sedation, administered by CRNA and propofol (Diprivan) 300mg  IV  DESCRIPTION OF PROCEDURE:   After the risks benefits and alternatives of the procedure were thoroughly explained, informed consent was obtained.  A digital rectal exam revealed no abnormalities of the rectum.   The LB [id number]  endoscope was introduced through the anus and advanced to the cecum, which was identified by both the appendix and ileocecal valve. No adverse events experienced.   The quality of the prep was Suprep good  The instrument was then slowly withdrawn as the colon was fully examined.      COLON FINDINGS: A sessile polyp measuring 8 mm in size was found at the cecum.  A polypectomy was performed with a cold snare.  The resection was complete and the polyp tissue was completely retrieved.   Internal hemorrhoids were found.   The colon was otherwise normal.  There was no diverticulosis, inflammation, polyps or cancers unless previously stated.  Retroflexed views revealed no abnormalities. The time to cecum=6 minutes 18 seconds. Withdrawal time=7 minutes 40 seconds.  The scope was withdrawn and the procedure completed. COMPLICATIONS: There were no complications.  ENDOSCOPIC IMPRESSION: 1.   Sessile polyp  measuring 8 mm in size was found at the cecum; polypectomy was performed with a cold snare 2.   Internal hemorrhoids 3.   The colon was otherwise normal  RECOMMENDATIONS: 1.  If the polyp(s) removed today are proven to be adenomatous (pre-cancerous) polyps, you will need a repeat colonoscopy in 5 years.  Otherwise you should continue to follow colorectal cancer screening guidelines for "routine risk" patients with colonoscopy in 10 years.  You will receive a letter within 1-2 weeks with the results of your biopsy as well as final recommendations.  Please call my office if you have not received a letter after 3 weeks. 2.  EGD   eSigned:  Inda Castle, MD 09/09/2013 3:46 PM   cc:

## 2013-09-09 NOTE — Patient Instructions (Signed)
Discharge instructions given with verbal understanding. Handout on polyps and a dilatation diet. Office will schedule future lab work. Resume previous medications. YOU HAD AN ENDOSCOPIC PROCEDURE TODAY AT [facility]: Refer to the procedure report that was given to you for any specific questions about what was found during the examination.  If the procedure report does not answer your questions, please call your gastroenterologist to clarify.  If you requested that your care partner not be given the details of your procedure findings, then the procedure report has been included in a sealed envelope for you to review at your convenience later.  YOU SHOULD EXPECT: Some feelings of bloating in the abdomen. Passage of more gas than usual.  Walking can help get rid of the air that was put into your GI tract during the procedure and reduce the bloating. If you had a lower endoscopy (such as a colonoscopy or flexible sigmoidoscopy) you may notice spotting of blood in your stool or on the toilet paper. If you underwent a bowel prep for your procedure, then you may not have a normal bowel movement for a few days.  DIET: Your first meal following the procedure should be a light meal and then it is ok to progress to your normal diet.  A half-sandwich or bowl of soup is an example of a good first meal.  Heavy or fried foods are harder to digest and may make you feel nauseous or bloated.  Likewise meals heavy in dairy and vegetables can cause extra gas to form and this can also increase the bloating.  Drink plenty of fluids but you should avoid alcoholic beverages for 24 hours.  ACTIVITY: Your care partner should take you home directly after the procedure.  You should plan to take it easy, moving slowly for the rest of the day.  You can resume normal activity the day after the procedure however you should NOT DRIVE or use heavy machinery for 24 hours (because of the sedation medicines used during the  test).    SYMPTOMS TO REPORT IMMEDIATELY: A gastroenterologist can be reached at any hour.  During normal business hours, 8:30 AM to 5:00 PM Monday through Friday, call [phone].  After hours and on weekends, please call the GI answering service at [phone] who will take a message and have the physician on call contact you.   Following lower endoscopy (colonoscopy or flexible sigmoidoscopy):  Excessive amounts of blood in the stool  Significant tenderness or worsening of abdominal pains  Swelling of the abdomen that is new, acute  Fever of 100F or higher  Following upper endoscopy (EGD)  Vomiting of blood or coffee ground material  New chest pain or pain under the shoulder blades  Painful or persistently difficult swallowing  New shortness of breath  Fever of 100F or higher  Black, tarry-looking stools  FOLLOW UP: If any biopsies were taken you will be contacted by phone or by letter within the next 1-3 weeks.  Call your gastroenterologist if you have not heard about the biopsies in 3 weeks.  Our staff will call the home number listed on your records the next business day following your procedure to check on you and address any questions or concerns that you may have at that time regarding the information given to you following your procedure. This is a courtesy call and so if there is no answer at the home number and we have not heard from you through the emergency physician  on call, we will assume that you have returned to your regular daily activities without incident.  SIGNATURES/CONFIDENTIALITY: You and/or your care partner have signed paperwork which will be entered into your electronic medical record.  These signatures attest to the fact that that the information above on your After Visit Summary has been reviewed and is understood.  Full responsibility of the confidentiality of this discharge information lies with you and/or your care-partner.

## 2013-09-09 NOTE — Progress Notes (Signed)
I agree with the above note, plan.  Rob, thanks for helping with her.  I am out of town this week and in [place] next week.

## 2013-09-09 NOTE — Progress Notes (Signed)
A/ox3 pleased with MAC, report to Celia RN 

## 2013-09-09 NOTE — Op Note (Signed)
[place]  [address]. [place], [id number]   ENDOSCOPY PROCEDURE REPORT  PATIENT: Ashley, Ryan.  MR#: [id number] BIRTHDATE: February 12, 1945 , 47  yrs. old GENDER: Female ENDOSCOPIST: Inda Castle, MD REFERRED BY:  [name], M.D. PROCEDURE DATE:  09/09/2013 PROCEDURE:  EGD, diagnostic and Maloney dilation of esophagus ASA CLASS:     Class II INDICATIONS:  Iron deficiency anemia.  Dysphagia MEDICATIONS: There was residual sedation effect present from prior procedure, MAC sedation, administered by CRNA, and propofol (Diprivan) 100mg  IV TOPICAL ANESTHETIC:  DESCRIPTION OF PROCEDURE: After the risks benefits and alternatives of the procedure were thoroughly explained, informed consent was obtained.  The LB [id number] endoscope was introduced through the mouth and advanced to the third portion of the duodenum. Without limitations.  The instrument was slowly withdrawn as the mucosa was fully examined.        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  The stenosis was traversable with the endoscope.   There was a 5-6 cm sliding hiatal hernia. There were 2 areas of superficial erosions in the hiatal hernia sac.  Remainder of exam was normal Retroflexed views revealed no abnormalities.     The scope was then withdrawn from the patient and the procedure completed.   A #52 Isabell Jarvis dilator was passed with mild resistance.  There was no heme.  COMPLICATIONS: There were no complications. ENDOSCOPIC IMPRESSION: 1.   Cameron erosions 2.   esophageal stricture-status post Venia Minks dilation 3.   large sliding hiatal hernia  Cameron erosions may explain source for chronic GI blood loss and iron deficiency anemia  RECOMMENDATIONS: 1.   increase Nexium to 40 mg twice a day 2.   iron supplementation 3.   CBC in 2 weeks and 6 weeks 4.   office visit with Dr. Oretha Caprice in approximately 6 weeks 5.   Hemoccults in  4-6 weeks REPEAT EXAM:  eSigned:  Inda Castle, MD 09/09/2013 3:56 PM   CC:  PATIENT NAME:  Ashley, Ryan. MR#: [id number]

## 2013-09-09 NOTE — Progress Notes (Signed)
Called to room to assist during endoscopic procedure.  Patient ID and intended procedure confirmed with present staff. Received instructions for my participation in the procedure from the performing physician.  

## 2013-09-10 ENCOUNTER — Telehealth: Payer: Self-pay | Admitting: Gastroenterology

## 2013-09-10 ENCOUNTER — Other Ambulatory Visit: Payer: Self-pay

## 2013-09-10 ENCOUNTER — Telehealth: Payer: Self-pay

## 2013-09-10 DIAGNOSIS — D509 Iron deficiency anemia, unspecified: Secondary | ICD-10-CM

## 2013-09-10 MED ORDER — ESOMEPRAZOLE MAGNESIUM 40 MG PO CPDR
40.0000 mg | DELAYED_RELEASE_CAPSULE | Freq: Two times a day (BID) | ORAL | Status: DC
Start: 2013-09-10 — End: 2014-02-18

## 2013-09-10 NOTE — Telephone Encounter (Signed)
Protonix??  On her report it says for her to increase Nexium to twice a day   Nothing about protonix  Left message for pt

## 2013-09-10 NOTE — Telephone Encounter (Signed)
  Follow up Call-  Call back number 09/09/2013  Post procedure Call Back phone  # [phone]  Permission to leave phone message No     Patient questions:  Do you have a fever, pain , or abdominal swelling? No. Pain Score  0 *  Have you tolerated food without any problems? Yes.    Have you been able to return to your normal activities? Yes.    Do you have any questions about your discharge instructions: Diet   No. Medications  No. Follow up visit  No.  Do you have questions or concerns about your Care? No.  Actions: * If pain score is 4 or above: No action needed, pain <4.  The pt said she did not receive the hemeccult cards yesterday.  Alphonsa Gin, RN will mail the card to the pt's home address.  No problems noted. maw

## 2013-09-10 NOTE — Telephone Encounter (Signed)
Called [facility], They stated that patient can not get a refill on Nexium at this time. She just got a 90 day refill  I explained to them that we increased her dosage of the nexium with new script. They said the patient would have to wait until she runs low and call them for them to contact her insurance for an override to fill the new script of Nexium.    Called patient and explained . Patient verbally undertands

## 2013-09-15 ENCOUNTER — Encounter: Payer: Self-pay | Admitting: Gastroenterology

## 2013-09-24 ENCOUNTER — Other Ambulatory Visit (INDEPENDENT_AMBULATORY_CARE_PROVIDER_SITE_OTHER): Payer: Medicare PPO

## 2013-09-24 DIAGNOSIS — D509 Iron deficiency anemia, unspecified: Secondary | ICD-10-CM

## 2013-09-24 LAB — CBC WITH DIFFERENTIAL/PLATELET
BASOS ABS: 0 10*3/uL (ref 0.0–0.1)
BASOS PCT: 1 % (ref 0.0–3.0)
EOS ABS: 0.1 10*3/uL (ref 0.0–0.7)
Eosinophils Relative: 1.9 % (ref 0.0–5.0)
HCT: 31.8 % — ABNORMAL LOW (ref 36.0–46.0)
Hemoglobin: 10.1 g/dL — ABNORMAL LOW (ref 12.0–15.0)
Lymphocytes Relative: 26.5 % (ref 12.0–46.0)
Lymphs Abs: 1.3 10*3/uL (ref 0.7–4.0)
MCHC: 32 g/dL (ref 30.0–36.0)
MCV: 73.2 fl — AB (ref 78.0–100.0)
MONO ABS: 0.5 10*3/uL (ref 0.1–1.0)
Monocytes Relative: 9.3 % (ref 3.0–12.0)
Neutro Abs: 3 10*3/uL (ref 1.4–7.7)
Neutrophils Relative %: 61.3 % (ref 43.0–77.0)
Platelets: 217 10*3/uL (ref 150.0–400.0)
RBC: 4.34 Mil/uL (ref 3.87–5.11)
RDW: 23.2 % — ABNORMAL HIGH (ref 11.5–15.5)
WBC: 4.9 10*3/uL (ref 4.0–10.5)

## 2013-09-25 ENCOUNTER — Other Ambulatory Visit: Payer: Self-pay | Admitting: Family Medicine

## 2013-09-29 ENCOUNTER — Other Ambulatory Visit (INDEPENDENT_AMBULATORY_CARE_PROVIDER_SITE_OTHER): Payer: Medicare PPO

## 2013-09-29 DIAGNOSIS — D509 Iron deficiency anemia, unspecified: Secondary | ICD-10-CM

## 2013-09-29 LAB — HEMOCCULT SLIDES (X 3 CARDS)
FECAL OCCULT BLD: NEGATIVE
OCCULT 1: NEGATIVE
OCCULT 2: NEGATIVE
OCCULT 3: NEGATIVE
OCCULT 4: NEGATIVE
OCCULT 5: NEGATIVE

## 2013-09-29 NOTE — Progress Notes (Signed)
Quick Note:  Please inform the patient that hemeoccults were negative and to continue current plan of action ______

## 2013-09-30 ENCOUNTER — Other Ambulatory Visit: Payer: Self-pay | Admitting: Family Medicine

## 2013-09-30 NOTE — Telephone Encounter (Signed)
Last office visit 08/10/2013.  Last refilled 07/01/2013 for #40 with no refills.  Ok to refill?

## 2013-10-01 NOTE — Telephone Encounter (Signed)
Rx called in to pharmacy. 

## 2013-10-07 ENCOUNTER — Telehealth: Payer: Self-pay | Admitting: Gastroenterology

## 2013-10-07 DIAGNOSIS — K222 Esophageal obstruction: Secondary | ICD-10-CM | POA: Insufficient documentation

## 2013-10-07 DIAGNOSIS — K219 Gastro-esophageal reflux disease without esophagitis: Secondary | ICD-10-CM | POA: Insufficient documentation

## 2013-10-07 DIAGNOSIS — K259 Gastric ulcer, unspecified as acute or chronic, without hemorrhage or perforation: Secondary | ICD-10-CM | POA: Insufficient documentation

## 2013-10-07 NOTE — Telephone Encounter (Signed)
The pt will have the pharmacy send a prior auth today and I will send that in.

## 2013-10-12 ENCOUNTER — Telehealth: Payer: Self-pay | Admitting: *Deleted

## 2013-10-12 NOTE — Telephone Encounter (Signed)
Patient was approved for twice a day Nexium

## 2013-10-21 ENCOUNTER — Other Ambulatory Visit (INDEPENDENT_AMBULATORY_CARE_PROVIDER_SITE_OTHER): Payer: Medicare PPO

## 2013-10-21 DIAGNOSIS — D509 Iron deficiency anemia, unspecified: Secondary | ICD-10-CM

## 2013-10-21 LAB — CBC WITH DIFFERENTIAL/PLATELET
Basophils Absolute: 0 10*3/uL (ref 0.0–0.1)
Basophils Relative: 0.9 % (ref 0.0–3.0)
EOS PCT: 1.5 % (ref 0.0–5.0)
Eosinophils Absolute: 0.1 10*3/uL (ref 0.0–0.7)
HCT: 34.5 % — ABNORMAL LOW (ref 36.0–46.0)
HEMOGLOBIN: 10.9 g/dL — AB (ref 12.0–15.0)
LYMPHS ABS: 1.7 10*3/uL (ref 0.7–4.0)
Lymphocytes Relative: 33.1 % (ref 12.0–46.0)
MCHC: 31.6 g/dL (ref 30.0–36.0)
MCV: 75.4 fl — AB (ref 78.0–100.0)
MONO ABS: 0.5 10*3/uL (ref 0.1–1.0)
Monocytes Relative: 10.1 % (ref 3.0–12.0)
NEUTROS ABS: 2.7 10*3/uL (ref 1.4–7.7)
Neutrophils Relative %: 54.4 % (ref 43.0–77.0)
PLATELETS: 210 10*3/uL (ref 150.0–400.0)
RBC: 4.58 Mil/uL (ref 3.87–5.11)
RDW: 24 % — ABNORMAL HIGH (ref 11.5–15.5)
WBC: 5 10*3/uL (ref 4.0–10.5)

## 2013-10-25 ENCOUNTER — Other Ambulatory Visit: Payer: Self-pay

## 2013-10-25 DIAGNOSIS — D508 Other iron deficiency anemias: Secondary | ICD-10-CM

## 2013-10-29 ENCOUNTER — Encounter: Payer: Self-pay | Admitting: Family Medicine

## 2013-10-29 ENCOUNTER — Ambulatory Visit (INDEPENDENT_AMBULATORY_CARE_PROVIDER_SITE_OTHER): Payer: Medicare PPO | Admitting: Family Medicine

## 2013-10-29 VITALS — BP 120/70 | HR 59 | Temp 98.3°F | Ht 61.5 in | Wt 153.5 lb

## 2013-10-29 DIAGNOSIS — I10 Essential (primary) hypertension: Secondary | ICD-10-CM

## 2013-10-29 DIAGNOSIS — Z01818 Encounter for other preprocedural examination: Secondary | ICD-10-CM

## 2013-10-29 DIAGNOSIS — D509 Iron deficiency anemia, unspecified: Secondary | ICD-10-CM

## 2013-10-29 DIAGNOSIS — E119 Type 2 diabetes mellitus without complications: Secondary | ICD-10-CM

## 2013-10-29 DIAGNOSIS — Z23 Encounter for immunization: Secondary | ICD-10-CM

## 2013-10-29 NOTE — Assessment & Plan Note (Signed)
Well controlled 

## 2013-10-29 NOTE — Progress Notes (Signed)
   Subjective:    Patient ID: Ashley Ryan, female    DOB: 03/22/1945, 68 y.o.   MRN: [id number]  HPI  68 year old female with well controlled DM and  HTN presents for surgical clearance prior to left  total knee replacement. Planned on 11/24/2013.  She states she has been feeling well except for severe knee pain.  She was found to be severely anemia earlier in the year. Energy is much improved after hemoglobin improved. (was 6.4 in 08/2013)  Hemoglobin 1 week ago with Dr. Deatra Ina was 10.9.  Has follow up on 11/05/2013 EGD: showed cameron erosions, stricture. Likely cause of anemia. She was started on nexium and iron.  Colonoscopy: sessile polyps due for repeat in 5 years.   FBS: 101-108, no > 200, < 60 Lab Results  Component Value Date   HGBA1C 6.5 08/05/2013   BP Readings from Last 3 Encounters:  10/29/13 120/70  09/09/13 143/82  09/07/13 130/80   No chest pain, minimal shortness of breath since anemia resolved.    Review of Systems  Constitutional: Negative for fever and fatigue.  HENT: Negative for ear pain.   Eyes: Negative for pain.  Respiratory: Negative for chest tightness and shortness of breath.   Cardiovascular: Negative for chest pain, palpitations and leg swelling.  Gastrointestinal: Negative for abdominal pain.  Genitourinary: Negative for dysuria.       Objective:   Physical Exam  Constitutional: Vital signs are normal. She appears well-developed and well-nourished. She is cooperative.  Non-toxic appearance. She does not appear ill. No distress.  HENT:  Head: Normocephalic.  Right Ear: Hearing, tympanic membrane, external ear and ear canal normal. Tympanic membrane is not erythematous, not retracted and not bulging.  Left Ear: Hearing, tympanic membrane, external ear and ear canal normal. Tympanic membrane is not erythematous, not retracted and not bulging.  Nose: No mucosal edema or rhinorrhea. Right sinus exhibits no maxillary sinus tenderness and no  frontal sinus tenderness. Left sinus exhibits no maxillary sinus tenderness and no frontal sinus tenderness.  Mouth/Throat: Uvula is midline, oropharynx is clear and moist and mucous membranes are normal.  Eyes: Conjunctivae, EOM and lids are normal. Pupils are equal, round, and reactive to light. Lids are everted and swept, no foreign bodies found.  Neck: Trachea normal and normal range of motion. Neck supple. Carotid bruit is not present. No mass and no thyromegaly present.  Cardiovascular: Normal rate, regular rhythm, S1 normal, S2 normal, normal heart sounds, intact distal pulses and normal pulses.  Exam reveals no gallop and no friction rub.   No murmur heard. Pulmonary/Chest: Effort normal and breath sounds normal. Not tachypneic. No respiratory distress. She has no decreased breath sounds. She has no wheezes. She has no rhonchi. She has no rales.  Abdominal: Soft. Normal appearance and bowel sounds are normal. There is no tenderness.  Neurological: She is alert.  Skin: Skin is warm, dry and intact. No rash noted.  Psychiatric: Her speech is normal and behavior is normal. Judgment and thought content normal. Her mood appears not anxious. Cognition and memory are normal. She does not exhibit a depressed mood.          Assessment & Plan:  Pt is low risk for moderately invasive surgery. Cleared to proceed. Will need to stay on PPI and have cbc followed closely given recent anemia and cameron erosions.

## 2013-10-29 NOTE — Progress Notes (Signed)
Pre visit review using our clinic review tool, if applicable. No additional management support is needed unless otherwise documented below in the visit note. 

## 2013-10-29 NOTE — Assessment & Plan Note (Signed)
Improved with iro and treatment of cameron erosions.

## 2013-10-29 NOTE — Assessment & Plan Note (Signed)
Diet controlled.  

## 2013-11-01 ENCOUNTER — Telehealth: Payer: Self-pay | Admitting: Family Medicine

## 2013-11-01 NOTE — Telephone Encounter (Signed)
emmi emailed °

## 2013-11-05 ENCOUNTER — Encounter: Payer: Self-pay | Admitting: Gastroenterology

## 2013-11-05 ENCOUNTER — Ambulatory Visit (INDEPENDENT_AMBULATORY_CARE_PROVIDER_SITE_OTHER): Payer: Medicare PPO | Admitting: Gastroenterology

## 2013-11-05 VITALS — BP 138/80 | HR 70 | Ht 60.0 in | Wt 153.4 lb

## 2013-11-05 DIAGNOSIS — K259 Gastric ulcer, unspecified as acute or chronic, without hemorrhage or perforation: Secondary | ICD-10-CM

## 2013-11-05 NOTE — Progress Notes (Signed)
Review of pertinent gastrointestinal problems: 1. Adenomatous polyp; Colonsocopy Dr. Deatra Ina 08/2013 for IDA, 44mm polyp removed, TA, recommended recall in 5 years 2. Dysphagia: EGD 2010 Dr. Ardis Hughs 5-6cm [facility], benign smooth GE junction stricture dilated to 31mm; repeat EGD Dr. Deatra Ina 08/2013 same as prior, maloney dilated to 54F, also noted Camerons' erosions felt possibly responsible for her severe IDA (Hb was 6.4)   HPI: This is a  very pleasant 68 year old woman whom I last saw several years ago. She saw my partners 2 months ago for severe iron deficiency anemia. See that workup above.   FOBT testing last week was negative times 6, CBC last week shows increasing Hb, improving microcytosis.  Feeling much improved.  Is taking iron once daily.  Has been taking nexium twice daily since EGD as well.  Nov 4th knee surgery.  Biggest complaint is knee pain.  No overt GI bleeding.  Swallowing improved.   Past Medical History  Diagnosis Date  . Arthritis   . Chronic headache   . Diverticulosis of colon   . Hypertension     Past Surgical History  Procedure Laterality Date  . Cholecystectomy    . Abdominal hysterectomy      one ovary remains    Current Outpatient Prescriptions  Medication Sig Dispense Refill  . amLODipine (NORVASC) 5 MG tablet TAKE 1 TABLET EVERY DAY  90 tablet  1  . aspirin EC 81 MG tablet Take 81 mg by mouth.      Marland Kitchen atenolol (TENORMIN) 100 MG tablet TAKE 1 TABLET BY MOUTH EVERY DAY  30 tablet  5  . Calcium Carbonate-Vitamin D (CALCIUM 600+D) 600-400 MG-UNIT per tablet Take 1 tablet by mouth daily.        Marland Kitchen EMBRACE BLOOD GLUCOSE TEST test strip       . esomeprazole (NEXIUM) 40 MG capsule Take 1 capsule (40 mg total) by mouth 2 (two) times daily before a meal.  60 capsule  5  . FLUoxetine (PROZAC) 20 MG capsule TAKE ONE CAPSULE BY MOUTH EVERY DAY  90 capsule  1  . fluticasone (FLONASE) 50 MCG/ACT nasal spray PLACE 2 SPRAYS INTO THE NOSE DAILY.  16 g  1  . gabapentin  (NEURONTIN) 100 MG capsule       . Glucosamine 500 MG TABS Take 1 tablet by mouth daily.        . hydrochlorothiazide (HYDRODIURIL) 25 MG tablet TAKE 1 TABLET BY MOUTH EVERY DAY  90 tablet  1  . Loratadine (CLARITIN) 10 MG CAPS Take 1 capsule by mouth as needed.        . traMADol (ULTRAM) 50 MG tablet TAKE 1 TABLET BY MOUTH EVERY 6 HOURS AS NEEDED FOR PAIN  40 tablet  0   No current facility-administered medications for this visit.    Allergies as of 11/05/2013 - Review Complete 11/05/2013  Allergen Reaction Noted  . Sulfonamide derivatives    . Sulfa antibiotics Rash 09/09/2013    Family History  Problem Relation Age of Onset  . Breast cancer    . Stomach cancer    . Diabetes    . Heart disease      History   Social History  . Marital Status: Married    Spouse Name: N/A    Number of Children: 2  . Years of Education: N/A   Occupational History  . retired cna    Social History Main Topics  . Smoking status: Never Smoker   . Smokeless tobacco: Never Used  .  Alcohol Use: No  . Drug Use: No  . Sexual Activity: Not on file   Other Topics Concern  . Not on file   Social History Narrative   Drinks sweet tea, diet sodas 4 a day   No living will, full code (reviewed 2014)      Physical Exam: BP 138/80  Pulse 70  Ht 5' (1.524 m)  Wt 153 lb 6.4 oz (69.582 kg)  BMI 29.96 kg/m2  SpO2 99% Constitutional: generally well-appearing Psychiatric: alert and oriented x3 Abdomen: soft, nontender, nondistended, no obvious ascites, no peritoneal signs, normal bowel sounds     Assessment and plan: 68 y.o. female with iron deficiency anemia likely from Cameron's erosions in her hiatal hernia, also adenomatous polyp  She will continue on iron supplements one pill once daily. Her counts are he began to improve. She will stay on Nexium one pill twice daily. She will have a repeat set of labs, CBC, and 3 months. She understands that she will probably need a colonoscopy again in  5 years for precancerous colon polyps.

## 2013-11-05 NOTE — Patient Instructions (Signed)
Repeat CBC in 3 months. Stay on iron once daily. Stay on nexium twice daily.

## 2013-11-11 ENCOUNTER — Ambulatory Visit: Payer: Self-pay | Admitting: Orthopedic Surgery

## 2013-11-11 LAB — CBC
HCT: 38 % (ref 35.0–47.0)
HGB: 11.7 g/dL — ABNORMAL LOW (ref 12.0–16.0)
MCH: 23.9 pg — ABNORMAL LOW (ref 26.0–34.0)
MCHC: 30.9 g/dL — AB (ref 32.0–36.0)
MCV: 77 fL — AB (ref 80–100)
Platelet: 196 10*3/uL (ref 150–440)
RBC: 4.91 10*6/uL (ref 3.80–5.20)
RDW: 22.2 % — ABNORMAL HIGH (ref 11.5–14.5)
WBC: 4.9 10*3/uL (ref 3.6–11.0)

## 2013-11-11 LAB — URINALYSIS, COMPLETE
BACTERIA: NONE SEEN
BILIRUBIN, UR: NEGATIVE
BLOOD: NEGATIVE
GLUCOSE, UR: NEGATIVE mg/dL (ref 0–75)
Ketone: NEGATIVE
LEUKOCYTE ESTERASE: NEGATIVE
Nitrite: NEGATIVE
Ph: 7 (ref 4.5–8.0)
Protein: NEGATIVE
Specific Gravity: 1.018 (ref 1.003–1.030)
WBC UR: <1 /HPF (ref 0–5)

## 2013-11-11 LAB — BASIC METABOLIC PANEL
ANION GAP: 0 — AB (ref 7–16)
BUN: 20 mg/dL — ABNORMAL HIGH (ref 7–18)
CREATININE: 0.69 mg/dL (ref 0.60–1.30)
Calcium, Total: 8.8 mg/dL (ref 8.5–10.1)
Chloride: 107 mmol/L (ref 98–107)
Co2: 28 mmol/L (ref 21–32)
GLUCOSE: 97 mg/dL (ref 65–99)
OSMOLALITY: 273 (ref 275–301)
Potassium: 4 mmol/L (ref 3.5–5.1)
Sodium: 135 mmol/L — ABNORMAL LOW (ref 136–145)

## 2013-11-11 LAB — APTT: Activated PTT: 34.9 secs (ref 23.6–35.9)

## 2013-11-11 LAB — MRSA PCR SCREENING

## 2013-11-11 LAB — PROTIME-INR
INR: 0.9
Prothrombin Time: 12.1 secs (ref 11.5–14.7)

## 2013-11-18 ENCOUNTER — Other Ambulatory Visit: Payer: Self-pay | Admitting: Family Medicine

## 2013-11-18 NOTE — Telephone Encounter (Signed)
Last office visit 10/29/2013.  Last refilled 09/30/2013 for #40 with no refills.  Ok to refill?

## 2013-11-19 NOTE — Telephone Encounter (Signed)
Called to [facility]. 

## 2013-11-24 ENCOUNTER — Inpatient Hospital Stay: Payer: Self-pay | Admitting: Orthopedic Surgery

## 2013-11-25 ENCOUNTER — Other Ambulatory Visit: Payer: Self-pay | Admitting: *Deleted

## 2013-11-25 LAB — CBC WITH DIFFERENTIAL/PLATELET
BASOS PCT: 0.4 %
Basophil #: 0 10*3/uL (ref 0.0–0.1)
EOS ABS: 0 10*3/uL (ref 0.0–0.7)
EOS PCT: 0.3 %
HCT: 35.6 % (ref 35.0–47.0)
HGB: 11.2 g/dL — AB (ref 12.0–16.0)
LYMPHS PCT: 8.5 %
Lymphocyte #: 0.9 10*3/uL — ABNORMAL LOW (ref 1.0–3.6)
MCH: 25.2 pg — ABNORMAL LOW (ref 26.0–34.0)
MCHC: 31.4 g/dL — ABNORMAL LOW (ref 32.0–36.0)
MCV: 80 fL (ref 80–100)
MONO ABS: 0.9 x10 3/mm (ref 0.2–0.9)
MONOS PCT: 9.1 %
NEUTROS ABS: 8.2 10*3/uL — AB (ref 1.4–6.5)
NEUTROS PCT: 81.7 %
Platelet: 162 10*3/uL (ref 150–440)
RBC: 4.44 10*6/uL (ref 3.80–5.20)
RDW: 20.6 % — ABNORMAL HIGH (ref 11.5–14.5)
WBC: 10.1 10*3/uL (ref 3.6–11.0)

## 2013-11-25 LAB — BASIC METABOLIC PANEL
Anion Gap: 7 (ref 7–16)
BUN: 16 mg/dL (ref 7–18)
CALCIUM: 7.7 mg/dL — AB (ref 8.5–10.1)
CHLORIDE: 104 mmol/L (ref 98–107)
CO2: 26 mmol/L (ref 21–32)
Creatinine: 0.67 mg/dL (ref 0.60–1.30)
Glucose: 100 mg/dL — ABNORMAL HIGH (ref 65–99)
Osmolality: 275 (ref 275–301)
POTASSIUM: 3.9 mmol/L (ref 3.5–5.1)
Sodium: 137 mmol/L (ref 136–145)

## 2013-11-25 MED ORDER — GABAPENTIN 100 MG PO CAPS: 100.0000 mg | ORAL_CAPSULE | Freq: Four times a day (QID) | ORAL | Status: DC

## 2013-11-25 NOTE — Telephone Encounter (Signed)
Last office visit 10/29/2013.  Ok to refill?

## 2013-11-26 LAB — CBC WITH DIFFERENTIAL/PLATELET
BASOS ABS: 0 10*3/uL (ref 0.0–0.1)
Basophil %: 0.5 %
Eosinophil #: 0 10*3/uL (ref 0.0–0.7)
Eosinophil %: 0.6 %
HCT: 28.9 % — AB (ref 35.0–47.0)
HGB: 9.2 g/dL — AB (ref 12.0–16.0)
LYMPHS PCT: 18.4 %
Lymphocyte #: 1 10*3/uL (ref 1.0–3.6)
MCH: 25.3 pg — ABNORMAL LOW (ref 26.0–34.0)
MCHC: 31.7 g/dL — ABNORMAL LOW (ref 32.0–36.0)
MCV: 80 fL (ref 80–100)
MONOS PCT: 12 %
Monocyte #: 0.7 x10 3/mm (ref 0.2–0.9)
NEUTROS ABS: 3.8 10*3/uL (ref 1.4–6.5)
Neutrophil %: 68.5 %
Platelet: 111 10*3/uL — ABNORMAL LOW (ref 150–440)
RBC: 3.62 10*6/uL — ABNORMAL LOW (ref 3.80–5.20)
RDW: 20.2 % — ABNORMAL HIGH (ref 11.5–14.5)
WBC: 5.5 10*3/uL (ref 3.6–11.0)

## 2013-11-27 LAB — CBC WITH DIFFERENTIAL/PLATELET
Basophil #: 0 10*3/uL (ref 0.0–0.1)
Basophil %: 0.5 %
EOS ABS: 0.1 10*3/uL (ref 0.0–0.7)
Eosinophil %: 1.1 %
HCT: 27.1 % — ABNORMAL LOW (ref 35.0–47.0)
HGB: 8.6 g/dL — AB (ref 12.0–16.0)
LYMPHS PCT: 18.5 %
Lymphocyte #: 0.9 10*3/uL — ABNORMAL LOW (ref 1.0–3.6)
MCH: 25.3 pg — AB (ref 26.0–34.0)
MCHC: 31.6 g/dL — ABNORMAL LOW (ref 32.0–36.0)
MCV: 80 fL (ref 80–100)
Monocyte #: 0.5 x10 3/mm (ref 0.2–0.9)
Monocyte %: 10 %
Neutrophil #: 3.4 10*3/uL (ref 1.4–6.5)
Neutrophil %: 69.9 %
Platelet: 112 10*3/uL — ABNORMAL LOW (ref 150–440)
RBC: 3.39 10*6/uL — AB (ref 3.80–5.20)
RDW: 20.3 % — AB (ref 11.5–14.5)
WBC: 4.8 10*3/uL (ref 3.6–11.0)

## 2013-12-07 ENCOUNTER — Telehealth: Payer: Self-pay

## 2013-12-07 NOTE — Telephone Encounter (Signed)
I actually have forms they have faxed over and will put in your in box for signature.  They also have a wound cream and a vitamin/supplement on the order form which I am not sure if you will want to approve.

## 2013-12-07 NOTE — Telephone Encounter (Signed)
Order form for diabetic testing supplies and vitamin/supplement faxed back to [facility] [phone].

## 2013-12-07 NOTE — Telephone Encounter (Signed)
She should test twice daily. Okay to give verbal order as requested.

## 2013-12-07 NOTE — Telephone Encounter (Signed)
Sign with changes. In outbox

## 2013-12-07 NOTE — Telephone Encounter (Signed)
Angie with [facility] request cb with verbal order for [employer] Voice Glucose monitor. Angie needs # of times pt is testing daily and is pt insulin dependent. Pt told Angie she test twice daily.Please advise.

## 2013-12-24 ENCOUNTER — Other Ambulatory Visit: Payer: Self-pay | Admitting: Family Medicine

## 2013-12-24 NOTE — Telephone Encounter (Deleted)
Last office visit 10/29/2013.  Last refilled

## 2013-12-26 ENCOUNTER — Other Ambulatory Visit: Payer: Self-pay | Admitting: Family Medicine

## 2013-12-27 ENCOUNTER — Other Ambulatory Visit: Payer: Self-pay

## 2013-12-27 MED ORDER — GABAPENTIN 100 MG PO CAPS: 100.0000 mg | ORAL_CAPSULE | Freq: Four times a day (QID) | ORAL | Status: DC

## 2013-12-27 NOTE — Telephone Encounter (Signed)
Pt left v/m requesting refill gabapentin; spoke with Marcello Moores pharmacist at [facility] and did not get refills sent electronically 11/25/13.Medication phoned toThomas at [facility] as instructed.pt notified done.

## 2014-01-15 ENCOUNTER — Other Ambulatory Visit: Payer: Self-pay | Admitting: Family Medicine

## 2014-01-25 ENCOUNTER — Other Ambulatory Visit: Payer: Self-pay

## 2014-01-31 ENCOUNTER — Other Ambulatory Visit (INDEPENDENT_AMBULATORY_CARE_PROVIDER_SITE_OTHER): Payer: Medicare PPO

## 2014-01-31 DIAGNOSIS — D508 Other iron deficiency anemias: Secondary | ICD-10-CM

## 2014-01-31 LAB — CBC WITH DIFFERENTIAL/PLATELET
Basophils Absolute: 0 10*3/uL (ref 0.0–0.1)
Basophils Relative: 0.8 % (ref 0.0–3.0)
Eosinophils Absolute: 0.1 10*3/uL (ref 0.0–0.7)
Eosinophils Relative: 1.4 % (ref 0.0–5.0)
HCT: 38.8 % (ref 36.0–46.0)
Hemoglobin: 12.4 g/dL (ref 12.0–15.0)
Lymphocytes Relative: 24.5 % (ref 12.0–46.0)
Lymphs Abs: 1.5 10*3/uL (ref 0.7–4.0)
MCHC: 32 g/dL (ref 30.0–36.0)
MCV: 80.2 fl (ref 78.0–100.0)
Monocytes Absolute: 0.5 10*3/uL (ref 0.1–1.0)
Monocytes Relative: 8 % (ref 3.0–12.0)
NEUTROS ABS: 3.9 10*3/uL (ref 1.4–7.7)
Neutrophils Relative %: 65.3 % (ref 43.0–77.0)
Platelets: 216 10*3/uL (ref 150.0–400.0)
RBC: 4.84 Mil/uL (ref 3.87–5.11)
RDW: 16.8 % — ABNORMAL HIGH (ref 11.5–15.5)
WBC: 6 10*3/uL (ref 4.0–10.5)

## 2014-02-09 ENCOUNTER — Other Ambulatory Visit: Payer: Self-pay | Admitting: *Deleted

## 2014-02-09 DIAGNOSIS — E119 Type 2 diabetes mellitus without complications: Secondary | ICD-10-CM

## 2014-02-09 MED ORDER — BD SWAB SINGLE USE REGULAR PADS: MEDICATED_PAD | Status: DC

## 2014-02-09 MED ORDER — AMLODIPINE BESYLATE 5 MG PO TABS: 5.0000 mg | ORAL_TABLET | Freq: Every day | ORAL | Status: DC

## 2014-02-09 MED ORDER — FLUTICASONE PROPIONATE 50 MCG/ACT NA SUSP: NASAL | Status: DC

## 2014-02-09 MED ORDER — ACCU-CHEK SOFTCLIX LANCETS MISC: Status: DC

## 2014-02-09 MED ORDER — GLUCOSE BLOOD VI STRP: ORAL_STRIP | Status: DC

## 2014-02-09 MED ORDER — ACCU-CHEK AVIVA PLUS W/DEVICE KIT: PACK | Status: DC

## 2014-02-09 MED ORDER — HYDROCHLOROTHIAZIDE 25 MG PO TABS: 25.0000 mg | ORAL_TABLET | Freq: Every day | ORAL | Status: DC

## 2014-02-09 MED ORDER — FLUOXETINE HCL 20 MG PO CAPS: 20.0000 mg | ORAL_CAPSULE | Freq: Every day | ORAL | Status: DC

## 2014-02-09 MED ORDER — ATENOLOL 100 MG PO TABS: 100.0000 mg | ORAL_TABLET | Freq: Every day | ORAL | Status: DC

## 2014-02-09 MED ORDER — GABAPENTIN 100 MG PO CAPS: 100.0000 mg | ORAL_CAPSULE | Freq: Four times a day (QID) | ORAL | Status: DC

## 2014-02-09 MED ORDER — ACCU-CHEK AVIVA VI SOLN: Status: DC

## 2014-02-18 ENCOUNTER — Telehealth: Payer: Self-pay | Admitting: *Deleted

## 2014-02-18 MED ORDER — ESOMEPRAZOLE MAGNESIUM 40 MG PO CPDR: 40.0000 mg | DELAYED_RELEASE_CAPSULE | Freq: Two times a day (BID) | ORAL | Status: DC

## 2014-02-18 NOTE — Telephone Encounter (Signed)
Medication sent to mail order 

## 2014-03-25 ENCOUNTER — Other Ambulatory Visit: Payer: Self-pay | Admitting: Family Medicine

## 2014-05-14 NOTE — Op Note (Signed)
PATIENT NAME:  Ashley Ryan, BOOZ MR#:  [id number] DATE OF BIRTH:  [birth year]  DATE OF PROCEDURE:  11/24/2013  PREOPERATIVE DIAGNOSIS: Left knee osteoarthritis.  POSTOPERATIVE DIAGNOSIS: Left knee osteoarthritis.  PROCEDURE: Left total knee arthroplasty.   SURGEON: Timoteo Gaul, MD   ASSISTANT: Lucas Mallow, MD  ESTIMATED BLOOD LOSS: 150 mL  TOURNIQUET TIME: 119 minutes.  IMPLANTS: DePuy Sigma posterior stabilized cemented femoral size 2 component; size 2 MBT keel rotating platform tibial tray, cemented; tibial rotating platform insert (stabilized) size 2 with a 10 mm thickness and a 35 mm 3-pegged oval dome patella.   INDICATIONS FOR THE PROCEDURE: The patient is a 69 year old female with persistent left knee pain which has become progressively worse. She had radiographic findings consistent with joint space narrowing, subchondral sclerosis, cyst formation, and marginal osteophyte formation. Her pain was affecting her ability to ambulate. The patient wished to proceed with a total knee arthroplasty after failing nonoperative management. She understood the risks and benefits of surgery, which I discussed with her in my office prior to the date of surgery. She understood the risks included infection requiring the removal of the prosthesis, bleeding requiring blood transfusion, nerve or blood vessel injury, knee stiffness, persistent pain or instability, loosening of the hardware, fracture, dislocation, and the need for further surgery. Medical risks include DVT and pulmonary embolism, myocardial infarction, stroke, pneumonia, respiratory failure, and death. The patient understood these risks and wished to proceed.   PROCEDURE NOTE: The patient was brought to the operating room. She underwent a spinal anesthetic. She was positioned supine on the operative table. She was prepped and draped in a sterile fashion. A timeout was performed to verify the patient's name, date of birth, medical  record number, correct site of surgery, and correct procedure to be performed. It was also used to verify the had received antibiotics and appropriate instruments, implants, and radiographic studies were available in the room. Once all in attendance were in agreement, the case began.   Examination under anesthesia revealed the patient had a range of motion from 0 to 120 degrees. There was no ligamentous laxity. She had palpable pedal pulses.   A midline incision was made over the left knee. Full-thickness skin flaps were developed. A medial arthrotomy was then created. The patella was everted, the knee was flexed to 90 degrees. Hoffa fat pad was debrided along with the cruciate ligaments of the medial and lateral menisci. A distal femoral drill was then used to penetrate into the intramedullary canal. This allowed for placement of the intramedullary distal femoral cutting guide. It was set for a 10 mm distal femoral resection. This cutting block was pinned into position. The intramedullary portion of the guide was then removed. An oscillating saw was then used to make the distal femoral cut. The attention was then turned to the proximal tibia. The external tibial alignment guide was placed over the anterior tibia. The slope of the anterior tibia was matched with this alignment guide. This was pinned in position resecting approximately 8 mm from the high lateral side. A drop-down alignment rod was used to confirm that the patient's proposed tibial cut would be neutral and in line with the mechanical axis of the tibia. A cross pin was then drilled into position. The oscillating saw was then used to make the proximal tibial cuts. The cutting block was then removed. The knee was brought into full extension and the patient was found to have excellent soft tissue balancing in extension with  the #10 spacer block. The attention was then turned back to the femur. The posterior referencing femoral sizing guide was used.  The patient was found to have the best fit with a size 2 component. The distal femoral size 2 cutting block was then gently malleted into position on the distal femur. It was positioned with approximately 3 degrees of external rotation. Appropriate alignment was confirmed using Whitesides line in the epicondylar axis. The oscillating saw was used to make the anterior, posterior, and chamfer cuts. A box cutting guide was then applied to the distal femur and the posterior stabilized box cut was made again using an oscillating saw.   The attention was then turned back to the tibia. The tibial plateau had best coverage with a size 2 tibial component. This was pinned into position. The drill tower was then applied and the keel was created using a reamer and then a flanged punch for the MBT keel component. The pins were then removed. The trial size 2 femur was also placed into position and a 10 mm spacing block was then placed. The knee was brought into full extension and had excellent ligamentous balancing both in extension and flexion.   The attention was then turned to patella preparation. The patella cutting guide was set to allow for 12 mm of patella to remain following the osteotomy. The patella cut was then made with an oscillating saw. A 35 mm 3-peg cutting guide was then placed over the patella and the 3 peg holes were made using a patella drill. The patella trial component was then placed. The knee was taken through a full range of motion and there was no patellar dislocation. All trial components were then removed. The knee joint was copiously irrigated with pulse lavage. All bone surfaces were then dried. Methylmethacrylate was then applied to all bone surfaces. The tibial tray rotating platform MBT keel cemented component, size 2, was then malleted into position. Excess methylmethacrylate was removed. The methylmethacrylate was then applied to the exposed bone surfaces of the femur and a Sigma femoral  posterior stabilized component, size 2 left, was then malleted into position. Again, excess methylmethacrylate was removed. A trial 10 mm size 2 tibial spacer was placed and the knee was brought into full extension. Methylmethacrylate was applied to the undersurface of the patella and the actual 3-pegged oval dome patella component was placed. This was held into position with a patella clamp. The methylmethacrylate was allowed to cure. The trial component was then removed. The capsule was then injected with a mix of bupivacaine, morphine, and Toradol along with long-acting Marcaine (Exparel). Two Autovac drain limbs were placed out the superolateral skin. The medial arthrotomy was then closed with a #2 Ti-Cron. The subcutaneous tissues were closed in 2 layers with 0 Vicryl and 2-0 Vicryl. The skin was approximated with staples. A dry sterile and compressive dressing was applied. The Autovac was activated. A Polar Care sleeve along with a knee immobilizer was applied to the left knee. The patient was then transferred to a hospital bed and brought to the PACU in stable condition.  I was scrubbed and present for the entire case, and all sharp and instrument counts were correct at the conclusion of the case. I spoke with the patient's family postoperatively. I let them know the patient had done very well with surgery and was stable in the recovery room.   ____________________________ Timoteo Gaul, MD klk:ST D: 11/29/2013 21:19:17 ET T: 11/29/2013 21:39:31 ET JOB#: [id number]  cc: Timoteo Gaul, MD, <Dictator> Timoteo Gaul MD ELECTRONICALLY SIGNED 12/08/2013 8:08

## 2014-05-14 NOTE — Discharge Summary (Signed)
PATIENT NAME:  Ashley Ryan, Ashley Ryan MR#:  [id number] DATE OF BIRTH:  [birth year]  DATE OF ADMISSION:  11/24/2013 DATE OF DISCHARGE:  11/27/2013   REASON FOR ADMISSION: Status post left total knee arthroplasty.   HOSPITAL COURSE: The patient is a 69 year old female who has had a persistent, progressively worsening knee pain. Her pain was affecting her ability to ambulate, and she wished to proceed with a total knee arthroplasty.   She underwent an uncomplicated left total knee arthroplasty on 11/24/2013. Postoperatively, she was admitted to the orthopedic surgery floor. She completed 24 hours of postoperative antibiotics. She started CPM on postoperative day #1. Her Hemovac drain was removed. She was complaining of 7 out of 10 pain in her knee. Adjustments were made to her narcotic medications, and the patient little appetite. She had a daily labs drawn and her hemoglobin and hematocrit remained stable throughout her hospitalization. She began Lovenox on postoperative day #1, and her deep vein thrombosis prophylaxis included TED stockings and AVI compression boots. Boost shakes were added to her diet.   On postoperative day #2, the patient's pain was much better controlled. She was up out of bed to a chair. Her vital signs remained stable. Her Foley catheter was removed. She was tolerating physical therapy much better. Her dressing was changed to Covaderm. She was encouraged to continue with incentive spirometry.   On postoperative day #3, the patient continued to do very well. She was discharged home at that time, given her clinical improvement.   DISCHARGE INSTRUCTIONS: The patient was discharged home with instructions to remain partial weight-bearing on her left lower extremity for four weeks postoperative. She may resume a regular diet. She will continue PT and OT at home, and continue using her Polar Care. She should keep her incision dry until followup. She will follow up in the office in 2 weeks for  re-evaluation, x-ray, and staple removal.   DISCHARGE MEDICATIONS: She was discharged home on Lovenox 30 mg subcutaneous b.i.d., and she was discharged on hydromorphone 2 mg tablets with instructions to take 1-2 tablets every 4-6 hours p.r.n. for pain. She will continue taking Colace 1 tablet b.i.d. while on narcotic pain medication.    ____________________________ Timoteo Gaul, MD klk:MT D: 12/13/2013 25:36:64 ET T: 12/13/2013 08:37:25 ET JOB#: [id number]  cc: Timoteo Gaul, MD, <Dictator> Timoteo Gaul MD ELECTRONICALLY SIGNED 12/14/2013 10:05

## 2014-05-24 DIAGNOSIS — H43813 Vitreous degeneration, bilateral: Secondary | ICD-10-CM | POA: Diagnosis not present

## 2014-05-24 LAB — HM DIABETES EYE EXAM

## 2014-05-31 ENCOUNTER — Encounter: Payer: Self-pay | Admitting: Family Medicine

## 2014-06-07 ENCOUNTER — Ambulatory Visit: Payer: Medicare PPO | Admitting: Family Medicine

## 2014-06-27 DIAGNOSIS — H40003 Preglaucoma, unspecified, bilateral: Secondary | ICD-10-CM | POA: Diagnosis not present

## 2014-06-28 ENCOUNTER — Telehealth: Payer: Self-pay | Admitting: *Deleted

## 2014-06-28 NOTE — Telephone Encounter (Signed)
Spoke with patient concerning mammogram scheduling and phone number provided to patient who says she will call tomorrow.

## 2014-07-01 ENCOUNTER — Other Ambulatory Visit: Payer: Self-pay | Admitting: Family Medicine

## 2014-07-01 DIAGNOSIS — Z1231 Encounter for screening mammogram for malignant neoplasm of breast: Secondary | ICD-10-CM

## 2014-07-05 ENCOUNTER — Ambulatory Visit
Admission: RE | Admit: 2014-07-05 | Discharge: 2014-07-05 | Disposition: A | Discharge status: Home / Self Care | Payer: Medicare PPO | Source: Ambulatory Visit | Attending: Family Medicine | Admitting: Family Medicine

## 2014-07-05 ENCOUNTER — Other Ambulatory Visit: Payer: Self-pay | Admitting: Family Medicine

## 2014-07-05 DIAGNOSIS — Z1231 Encounter for screening mammogram for malignant neoplasm of breast: Secondary | ICD-10-CM | POA: Insufficient documentation

## 2014-07-12 ENCOUNTER — Encounter: Payer: Self-pay | Admitting: Gastroenterology

## 2014-07-12 DIAGNOSIS — H4011X2 Primary open-angle glaucoma, moderate stage: Secondary | ICD-10-CM | POA: Diagnosis not present

## 2014-07-18 ENCOUNTER — Encounter: Payer: Self-pay | Admitting: *Deleted

## 2014-08-14 ENCOUNTER — Other Ambulatory Visit: Payer: Self-pay | Admitting: Family Medicine

## 2014-08-14 NOTE — Telephone Encounter (Signed)
Please call and schedule CPE with fasting labs prior with Dr. Bedsole. 

## 2014-08-15 NOTE — Telephone Encounter (Signed)
Tried calling pt no voicemail set up 

## 2014-08-15 NOTE — Telephone Encounter (Signed)
cpx 10/20

## 2014-09-12 ENCOUNTER — Other Ambulatory Visit: Payer: Self-pay | Admitting: Family Medicine

## 2014-09-12 ENCOUNTER — Other Ambulatory Visit: Payer: Self-pay | Admitting: Gastroenterology

## 2014-11-01 ENCOUNTER — Ambulatory Visit (INDEPENDENT_AMBULATORY_CARE_PROVIDER_SITE_OTHER): Payer: Medicare PPO | Admitting: Family Medicine

## 2014-11-01 ENCOUNTER — Other Ambulatory Visit: Payer: Self-pay | Admitting: *Deleted

## 2014-11-01 ENCOUNTER — Encounter: Payer: Self-pay | Admitting: Family Medicine

## 2014-11-01 ENCOUNTER — Ambulatory Visit (INDEPENDENT_AMBULATORY_CARE_PROVIDER_SITE_OTHER)
Admission: RE | Admit: 2014-11-01 | Discharge: 2014-11-01 | Disposition: A | Discharge status: Home / Self Care | Payer: Medicare PPO | Source: Ambulatory Visit | Attending: Family Medicine | Admitting: Family Medicine

## 2014-11-01 VITALS — BP 142/82 | HR 68 | Temp 97.7°F | Wt 155.0 lb

## 2014-11-01 DIAGNOSIS — S92341A Displaced fracture of fourth metatarsal bone, right foot, initial encounter for closed fracture: Secondary | ICD-10-CM | POA: Diagnosis not present

## 2014-11-01 DIAGNOSIS — M79671 Pain in right foot: Secondary | ICD-10-CM

## 2014-11-01 DIAGNOSIS — M810 Age-related osteoporosis without current pathological fracture: Secondary | ICD-10-CM

## 2014-11-01 DIAGNOSIS — Z23 Encounter for immunization: Secondary | ICD-10-CM

## 2014-11-01 MED ORDER — HYDROCODONE-ACETAMINOPHEN 5-325 MG PO TABS: 1.0000 | ORAL_TABLET | Freq: Three times a day (TID) | ORAL | Status: DC | PRN

## 2014-11-01 NOTE — Assessment & Plan Note (Addendum)
Injury 1+ wk ago, xray showing what appears like nondisplaced distal fracture of 4th metatarsal. Will place in crutches and refer to ortho for definitive management. Hydrocodone 5/325mg  for breakthrough pain (#20). Pt agrees with plan.

## 2014-11-01 NOTE — Assessment & Plan Note (Signed)
Continue cal/vit D, rec discuss other treatment with PCP.

## 2014-11-01 NOTE — Patient Instructions (Addendum)
Flu shot today. R foot xray today - you have metatarsal fracture. I want to refer you to orthopedist Dr Mack Guise.  Use crutches - no weight bearing until seen by them, you may need a cast. Keep foot elevated. Hydrocodone for pain.

## 2014-11-01 NOTE — Progress Notes (Signed)
Pre visit review using our clinic review tool, if applicable. No additional management support is needed unless otherwise documented below in the visit note. 

## 2014-11-01 NOTE — Progress Notes (Signed)
BP 142/82 mmHg  Pulse 68  Temp(Src) 97.7 F (36.5 C) (Oral)  Wt 155 lb (70.308 kg)   CC: R foot pain Subjective:    Patient ID: Ashley Ryan, female    DOB: [birth year], 69 y.o.   MRN: [id number]  HPI: LEQUITA MEADOWCROFT is a 69 y.o. female presenting on 11/01/2014 for Foot Pain   1+ week ago while walking outside her mobile home, slipped and rolled R foot. Didn't fall. Residual pain since then worse with ambulation but also present at rest. Points to dorsal lateral midfoot as site of pain. Treating with ice.   Known osteoporosis on calcium/vit D. Fosamax previously caused side effects.  Cannot tolerate ibuprofen 2/2 h/o gastric ulcer  Relevant past medical, surgical, family and social history reviewed and updated as indicated. Interim medical history since our last visit reviewed. Allergies and medications reviewed and updated. Current Outpatient Prescriptions on File Prior to Visit  Medication Sig  . ACCU-CHEK SOFTCLIX LANCETS lancets Use to check blood sugar once daily  . Alcohol Swabs (B-D SINGLE USE SWABS REGULAR) PADS Use to check blood sugar once daily  . amLODipine (NORVASC) 5 MG tablet TAKE 1 TABLET EVERY DAY  . aspirin EC 81 MG tablet Take 81 mg by mouth.  Marland Kitchen atenolol (TENORMIN) 100 MG tablet TAKE 1 TABLET BY MOUTH EVERY DAY  . Blood Glucose Calibration (ACCU-CHEK AVIVA) SOLN Use to check blood sugar once daily  . Blood Glucose Monitoring Suppl (ACCU-CHEK AVIVA PLUS) W/DEVICE KIT Use to check blood sugar once daily  . Calcium Carbonate-Vitamin D (CALCIUM 600+D) 600-400 MG-UNIT per tablet Take 1 tablet by mouth daily.    Marland Kitchen esomeprazole (NEXIUM) 40 MG capsule Take 1 capsule (40 mg total) by mouth 2 (two) times daily before a meal.  . FLUoxetine (PROZAC) 20 MG capsule TAKE ONE CAPSULE BY MOUTH EVERY DAY  . fluticasone (FLONASE) 50 MCG/ACT nasal spray PLACE 2 SPRAYS INTO THE NOSE DAILY.  Marland Kitchen gabapentin (NEURONTIN) 100 MG capsule Take 1 capsule (100 mg total) by mouth 4 (four)  times daily.  . Glucosamine 500 MG TABS Take 1 tablet by mouth daily.    Marland Kitchen glucose blood (ACCU-CHEK AVIVA) test strip Use to check blood sugar once daily  . hydrochlorothiazide (HYDRODIURIL) 25 MG tablet Take 1 tablet (25 mg total) by mouth daily.  . Loratadine (CLARITIN) 10 MG CAPS Take 1 capsule by mouth as needed.    . traMADol (ULTRAM) 50 MG tablet TAKE 1 TABLET BY MOUTH EVERY 6 HOURS AS NEEDED   No current facility-administered medications on file prior to visit.   Past Medical History  Diagnosis Date  . Arthritis   . Chronic headache   . Diverticulosis of colon   . Hypertension     Past Surgical History  Procedure Laterality Date  . Cholecystectomy    . Abdominal hysterectomy      one ovary remains    Patient Active Problem List   Diagnosis Date Noted  . Closed fracture of fourth metatarsal of right foot 11/01/2014  . 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 GERD (gastroesophageal reflux disease) 10/07/2013  . Stricture and stenosis of esophagus 10/07/2013  . Iron deficiency anemia 09/08/2013  . Osteoporosis 04/30/2012  . De Quervain's tenosynovitis, left 07/05/2011  . DIABETIC PERIPHERAL NEUROPATHY 12/27/2009  . Osteoarthritis, bilateral knees 11/23/2009  . Diabetes mellitus type II, controlled ([place]) 02/03/2009  . HYPERTRIGLYCERIDEMIA 02/03/2009  . COMMON MIGRAINE 11/03/2008  . ESSENTIAL HYPERTENSION, BENIGN 11/03/2008  . ALLERGIC RHINITIS DUE TO  POLLEN 11/03/2008  . HIATAL HERNIA WITH REFLUX 11/03/2008  . DIVERTICULOSIS OF COLON 11/03/2008  . MURMUR 11/03/2008  . GASTRIC ULCER, HX OF 11/03/2008    Review of Systems Per HPI unless specifically indicated above     Objective:    BP 142/82 mmHg  Pulse 68  Temp(Src) 97.7 F (36.5 C) (Oral)  Wt 155 lb (70.308 kg)  Wt Readings from Last 3 Encounters:  11/01/14 155 lb (70.308 kg)  11/05/13 153 lb 6.4 oz (69.582 kg)  10/29/13 153 lb 8 oz (69.627 kg)    Physical Exam  Constitutional: She appears well-developed and  well-nourished. No distress.  Musculoskeletal: She exhibits edema (mild dorsolateral right foot).  2+ DP bilaterally, sensation intact L foot WNL R foot - no pain at malleoli, no pain with calcaneal squeeze, no pain at navicular or at base of 5th MT, no ankle ligament tenderness or laxity, tender to palpation at dorsal and ventral mid lateral metatarsals (3-5th)  Skin: Skin is warm and dry. No rash noted. No erythema.  Nursing note and vitals reviewed.  Results for orders placed or performed in visit on 07/18/14  [facility] MAMMOGRAPHY  Result Value Ref Range   [facility] Mammogram media and imaging       Assessment & Plan:   Problem List Items Addressed This Visit    Osteoporosis    Continue cal/vit D, rec discuss other treatment with PCP.      Closed fracture of fourth metatarsal of right foot - Primary    Injury 1+ wk ago, xray showing what appears like nondisplaced distal fracture of 4th metatarsal. Will place in crutches and refer to ortho for definitive management. Hydrocodone 5/375m for breakthrough pain (#20). Pt agrees with plan.      Relevant Orders   DG Foot Complete Right (Completed)   AMB referral to orthopedics    Other Visit Diagnoses    Need for influenza vaccination        Relevant Orders    Flu Vaccine QUAD 36+ mos PF IM (Fluarix & Fluzone Quad PF) (Completed)        Follow up plan: No Follow-up on file.

## 2014-11-02 DIAGNOSIS — S92344A Nondisplaced fracture of fourth metatarsal bone, right foot, initial encounter for closed fracture: Secondary | ICD-10-CM | POA: Diagnosis not present

## 2014-11-03 ENCOUNTER — Other Ambulatory Visit (INDEPENDENT_AMBULATORY_CARE_PROVIDER_SITE_OTHER): Payer: Medicare PPO

## 2014-11-03 ENCOUNTER — Telehealth: Payer: Self-pay | Admitting: Family Medicine

## 2014-11-03 DIAGNOSIS — D509 Iron deficiency anemia, unspecified: Secondary | ICD-10-CM

## 2014-11-03 DIAGNOSIS — E119 Type 2 diabetes mellitus without complications: Secondary | ICD-10-CM

## 2014-11-03 DIAGNOSIS — M810 Age-related osteoporosis without current pathological fracture: Secondary | ICD-10-CM | POA: Diagnosis not present

## 2014-11-03 LAB — LIPID PANEL
CHOL/HDL RATIO: 3
Cholesterol: 178 mg/dL (ref 0–200)
HDL: 52.6 mg/dL (ref >39.00)
LDL CALC: 91 mg/dL (ref 0–99)
NONHDL: 125.47
Triglycerides: 173 mg/dL — ABNORMAL HIGH (ref 0.0–149.0)
VLDL: 34.6 mg/dL (ref 0.0–40.0)

## 2014-11-03 LAB — CBC WITH DIFFERENTIAL/PLATELET
BASOS PCT: 0.5 % (ref 0.0–3.0)
Basophils Absolute: 0 10*3/uL (ref 0.0–0.1)
EOS ABS: 0.1 10*3/uL (ref 0.0–0.7)
Eosinophils Relative: 2.9 % (ref 0.0–5.0)
HEMATOCRIT: 38.7 % (ref 36.0–46.0)
HEMOGLOBIN: 12.4 g/dL (ref 12.0–15.0)
LYMPHS PCT: 26.6 % (ref 12.0–46.0)
Lymphs Abs: 1.1 10*3/uL (ref 0.7–4.0)
MCHC: 32.2 g/dL (ref 30.0–36.0)
MCV: 83.3 fl (ref 78.0–100.0)
MONOS PCT: 11 % (ref 3.0–12.0)
Monocytes Absolute: 0.5 10*3/uL (ref 0.1–1.0)
NEUTROS ABS: 2.5 10*3/uL (ref 1.4–7.7)
Neutrophils Relative %: 59 % (ref 43.0–77.0)
PLATELETS: 185 10*3/uL (ref 150.0–400.0)
RBC: 4.64 Mil/uL (ref 3.87–5.11)
RDW: 15.6 % — AB (ref 11.5–15.5)
WBC: 4.2 10*3/uL (ref 4.0–10.5)

## 2014-11-03 LAB — COMPREHENSIVE METABOLIC PANEL
ALBUMIN: 4.1 g/dL (ref 3.5–5.2)
ALT: 14 U/L (ref 0–35)
AST: 16 U/L (ref 0–37)
Alkaline Phosphatase: 71 U/L (ref 39–117)
BUN: 13 mg/dL (ref 6–23)
CALCIUM: 9.3 mg/dL (ref 8.4–10.5)
CHLORIDE: 101 meq/L (ref 96–112)
CO2: 30 meq/L (ref 19–32)
Creatinine, Ser: 0.55 mg/dL (ref 0.40–1.20)
GFR: 115.66 mL/min (ref >60.00)
Glucose, Bld: 92 mg/dL (ref 70–99)
POTASSIUM: 3.7 meq/L (ref 3.5–5.1)
Sodium: 139 mEq/L (ref 135–145)
Total Bilirubin: 0.3 mg/dL (ref 0.2–1.2)
Total Protein: 6.8 g/dL (ref 6.0–8.3)

## 2014-11-03 LAB — HEMOGLOBIN A1C: Hgb A1c MFr Bld: 5.8 % (ref 4.6–6.5)

## 2014-11-03 LAB — VITAMIN D 25 HYDROXY (VIT D DEFICIENCY, FRACTURES): VITD: 18.71 ng/mL — ABNORMAL LOW (ref 30.00–100.00)

## 2014-11-10 ENCOUNTER — Ambulatory Visit (INDEPENDENT_AMBULATORY_CARE_PROVIDER_SITE_OTHER): Payer: Medicare PPO | Admitting: Family Medicine

## 2014-11-10 ENCOUNTER — Encounter: Payer: Self-pay | Admitting: Family Medicine

## 2014-11-10 ENCOUNTER — Other Ambulatory Visit: Payer: Self-pay | Admitting: Family Medicine

## 2014-11-10 VITALS — BP 129/63 | HR 54 | Temp 98.3°F | Ht 60.0 in | Wt 158.8 lb

## 2014-11-10 DIAGNOSIS — Z1159 Encounter for screening for other viral diseases: Secondary | ICD-10-CM | POA: Diagnosis not present

## 2014-11-10 DIAGNOSIS — M810 Age-related osteoporosis without current pathological fracture: Secondary | ICD-10-CM

## 2014-11-10 DIAGNOSIS — Z7189 Other specified counseling: Secondary | ICD-10-CM | POA: Insufficient documentation

## 2014-11-10 DIAGNOSIS — I10 Essential (primary) hypertension: Secondary | ICD-10-CM | POA: Diagnosis not present

## 2014-11-10 DIAGNOSIS — E119 Type 2 diabetes mellitus without complications: Secondary | ICD-10-CM | POA: Diagnosis not present

## 2014-11-10 DIAGNOSIS — Z0000 Encounter for general adult medical examination without abnormal findings: Secondary | ICD-10-CM

## 2014-11-10 LAB — MICROALBUMIN / CREATININE URINE RATIO
Creatinine,U: 69.1 mg/dL
Microalb Creat Ratio: 1 mg/g (ref 0.0–30.0)
Microalb, Ur: <0.7 mg/dL (ref 0.0–1.9)

## 2014-11-10 LAB — HM DIABETES FOOT EXAM

## 2014-11-10 NOTE — Assessment & Plan Note (Signed)
Well controlled previously, likely higher initially today given pain and walking with [place].. Continue current medication.

## 2014-11-10 NOTE — Progress Notes (Signed)
HPI  I have personally reviewed the Medicare Annual Wellness questionnaire and have noted 1. The patient's medical and social history 2. Their use of alcohol, tobacco or illicit drugs 3. Their current medications and supplements 4. The patient's functional ability including ADL's, fall risks, home safety risks and hearing or visual             impairment. 5. Diet and physical activities 6. Evidence for depression or mood disorders 7.         Updated provider list Cognitive evaluation was performed and recorded on pt medicare questionnaire form. The patients weight, height, BMI and visual acuity have been recorded in the chart  I have made referrals, counseling and provided education to the patient based review of the above and I have provided the pt with a written personalized care plan for preventive services.   >2 falls in last year, accidental. Reviewed home safety and fall prevention.   Osteoarthritis in knee,S/P left knee replacement. Recovered well. Doing rehab.   Diabetes: Well controlled on no medication.  Lab Results  Component Value Date   HGBA1C 5.8 11/03/2014  Using medications without difficulties:None  Hypoglycemic episodes:None  Hyperglycemic episodes:NONE  Feet problems: DM neuropathy: improved on gabapentin, takes one in AM and 3 tabs at night. Moderate control. Blood Sugars averaging: Has not checking in a while Eye exam within last year: yes.   Hypertension: Previously at goal < 130/80 on amlodipine, HCTZ, atenolol. She does have pain in foot and lugging around [employer]. BP Readings from Last 3 Encounters:  11/10/14 140/78  11/01/14 142/82  11/05/13 138/80  Using medication without problems or lightheadedness: None  Chest pain with exertion:None  Edema: mild off and on Short of breath:Yes, with extreme exertion Average home BPs: not checking  Other issues:   Elevated Cholesterol: LDL at goal <100, triglycerides above goal. On no med.  Lab  Results  Component Value Date   CHOL 178 11/03/2014   HDL 52.60 11/03/2014   LDLCALC 91 11/03/2014   LDLDIRECT 102.5 07/21/2009   TRIG 173.0* 11/03/2014   CHOLHDL 3 11/03/2014   Diet compliance: Good  Exercise: No dedicated exercise, back at work moving more  Other complaints:    Does history of gastric ulcer years ago.  On nexium daily.   Peripheral neuropathy: Will controlled gabapentin 100 mg in AM, 300 mg at night.  Wt Readings from Last 3 Encounters:  11/10/14 158 lb 12 oz (72.009 kg)  11/01/14 155 lb (70.308 kg)  11/05/13 153 lb 6.4 oz (69.582 kg)   Vit d low. She has not been taking ca and vit D.  Social History /Family History/Past Medical History reviewed and updated if needed.  Review of Systems  Constitutional: Negative for fever, fatigue and unexpected weight change.  HENT: Negative for ear pain, congestion, sore throat, sneezing, trouble swallowing and sinus pressure.  Eyes: Negative for pain and itching.  Respiratory: Negative for cough, shortness of breath and wheezing.  Cardiovascular: Negative for chest pain, palpitations and leg swelling.  Gastrointestinal: Negative for nausea, abdominal pain, diarrhea, constipation and blood in stool.  Genitourinary: Negative for dysuria, hematuria, vaginal discharge, difficulty urinating and menstrual problem.  Skin: Negative for rash.  Neurological: Negative for syncope, weakness, light-headedness, numbness and headaches.  Psychiatric/Behavioral: Negative for confusion and dysphoric mood. The patient is not nervous/anxious.  Objective:   Physical Exam  Constitutional: Vital signs are normal. She appears well-developed and well-nourished. She is cooperative. Non-toxic appearance. She does not appear ill. No  distress.  HENT:  Head: Normocephalic.  Right Ear: Hearing, tympanic membrane, external ear and ear canal normal.  Left Ear: Hearing, tympanic membrane, external ear and ear canal normal.  Nose:  Nose normal.  Eyes: Conjunctivae, EOM and lids are normal. Pupils are equal, round, and reactive to light. No foreign bodies found.  Neck: Trachea normal and normal range of motion. Neck supple. Carotid bruit is not present. No mass and no thyromegaly present.  Cardiovascular: Normal rate, regular rhythm, S1 normal, S2 normal, normal heart sounds and intact distal pulses. Exam reveals no gallop. 2/6 sys murmur greatest at RUSB Pulmonary/Chest: Effort normal and breath sounds normal. No respiratory distress. She has no wheezes. She has no rhonchi. She has no rales.  Abdominal: Soft. Normal appearance and bowel sounds are normal. She exhibits no distension, no fluid wave, no abdominal bruit and no mass. There is no hepatosplenomegaly. There is no tenderness. There is no rebound, no guarding and no CVA tenderness. No hernia. Hernia confirmed negative in the right inguinal area and confirmed negative in the left inguinal area.  Genitourinary: . No breast swelling, tenderness, discharge or bleeding. Lymphadenopathy:  She has no cervical adenopathy.  She has no axillary adenopathy.  Right: No inguinal adenopathy present.  Left: No inguinal adenopathy present.  Neurological: She is alert. She has normal strength. No cranial nerve deficit or sensory deficit.  Skin: Skin is warm, dry and intact. No rash noted.  Psychiatric: Her speech is normal and behavior is normal. Judgment normal. Her mood appears not anxious. Cognition and memory are normal. She does not exhibit a depressed mood.  Assessment & Plan:   The patient's preventative maintenance and recommended screening tests for an annual wellness exam were reviewed in full today.  Brought up to date unless services declined.   Vaccines: uptodate td and PNA, refused shingles, not covered.Marland Kitchen DEXA: osteoporosis 04/2012, off fosamax, stopped for tounge soreness.  Has stomach ulcer 11/2103 on PPI nexium. Will try boniva. Colon: 08/2013, Dr. Guy Begin, repeat  5 year. Mammo: nml 06/2014, sister breast cancer age 33.  PAP/DVE: partial hysterectomy, no pap smear or DVE required,. No family history of ovarian cancer.  Hep C:will do.  microalbumin: done today.

## 2014-11-10 NOTE — Assessment & Plan Note (Signed)
At goal.  

## 2014-11-10 NOTE — Progress Notes (Signed)
Pre visit review using our clinic review tool, if applicable. No additional management support is needed unless otherwise documented below in the visit note. 

## 2014-11-10 NOTE — Patient Instructions (Addendum)
Restart vit D supplement 400 IU twice daily.  Call to see insurance coverage of Boniva injection every 3 months.  Stop at front desk for bone density.   Stop at lab for hep C test.  Set up advance care planning when able.

## 2014-11-10 NOTE — Assessment & Plan Note (Signed)
Failed fosamax. Has uclers. Recommend IV boniva or prolia.  Pt will look into. Recheck DEXA.

## 2014-11-11 ENCOUNTER — Encounter: Payer: Self-pay | Admitting: *Deleted

## 2014-11-11 LAB — HEPATITIS C ANTIBODY: HCV Ab: NEGATIVE

## 2014-11-13 ENCOUNTER — Other Ambulatory Visit: Payer: Self-pay | Admitting: Family Medicine

## 2014-11-14 ENCOUNTER — Telehealth: Payer: Self-pay | Admitting: Family Medicine

## 2014-11-14 MED ORDER — ATENOLOL 100 MG PO TABS: 100.0000 mg | ORAL_TABLET | Freq: Every day | ORAL | Status: DC

## 2014-11-14 NOTE — Telephone Encounter (Signed)
Refills sent to [facility].

## 2014-11-14 NOTE — Telephone Encounter (Signed)
Ro from [facility] called.  They got a denial for pt's Atenolol.  It was denied saying we gave one in March 4 for 90 tablets with 3 refills, but [facility] does not see that in their system.  They need [place] to send that again and we could even date it 03/25/14 if we wanted but they definitely don't have it in their system and they need it re-prescribed for the patient.

## 2014-11-17 DIAGNOSIS — S92314D Nondisplaced fracture of first metatarsal bone, right foot, subsequent encounter for fracture with routine healing: Secondary | ICD-10-CM | POA: Diagnosis not present

## 2014-11-18 DIAGNOSIS — H401132 Primary open-angle glaucoma, bilateral, moderate stage: Secondary | ICD-10-CM | POA: Diagnosis not present

## 2014-11-22 ENCOUNTER — Telehealth: Payer: Self-pay | Admitting: Family Medicine

## 2014-11-22 ENCOUNTER — Ambulatory Visit
Admission: RE | Admit: 2014-11-22 | Discharge: 2014-11-22 | Disposition: A | Discharge status: Home / Self Care | Payer: Medicare PPO | Source: Ambulatory Visit | Attending: Family Medicine | Admitting: Family Medicine

## 2014-11-22 DIAGNOSIS — M810 Age-related osteoporosis without current pathological fracture: Secondary | ICD-10-CM | POA: Diagnosis not present

## 2014-11-22 DIAGNOSIS — Z780 Asymptomatic menopausal state: Secondary | ICD-10-CM | POA: Insufficient documentation

## 2014-11-22 NOTE — Telephone Encounter (Signed)
[place] notified as instructed by telephone.

## 2014-11-22 NOTE — Telephone Encounter (Signed)
Notify pt that her bone density looks better, has improved from 2014.Marland Kitchen Have her take Ca and vit D, weight bearing exercise.  We will hold on any other med and recheck in 2018.

## 2014-12-12 ENCOUNTER — Other Ambulatory Visit: Payer: Self-pay | Admitting: Family Medicine

## 2014-12-25 ENCOUNTER — Other Ambulatory Visit: Payer: Self-pay | Admitting: Family Medicine

## 2014-12-26 NOTE — Telephone Encounter (Signed)
Last office visit 11/10/2014.  Last refilled 01/22/2014 for #120 with 5 refills.  Ok to refill?

## 2015-03-18 DIAGNOSIS — D509 Iron deficiency anemia, unspecified: Secondary | ICD-10-CM | POA: Diagnosis not present

## 2015-03-18 DIAGNOSIS — E119 Type 2 diabetes mellitus without complications: Secondary | ICD-10-CM | POA: Diagnosis not present

## 2015-03-18 DIAGNOSIS — H409 Unspecified glaucoma: Secondary | ICD-10-CM | POA: Diagnosis not present

## 2015-03-18 DIAGNOSIS — K219 Gastro-esophageal reflux disease without esophagitis: Secondary | ICD-10-CM | POA: Diagnosis not present

## 2015-03-18 DIAGNOSIS — E669 Obesity, unspecified: Secondary | ICD-10-CM | POA: Diagnosis not present

## 2015-03-18 DIAGNOSIS — F329 Major depressive disorder, single episode, unspecified: Secondary | ICD-10-CM | POA: Diagnosis not present

## 2015-03-18 DIAGNOSIS — G629 Polyneuropathy, unspecified: Secondary | ICD-10-CM | POA: Diagnosis not present

## 2015-03-18 DIAGNOSIS — I10 Essential (primary) hypertension: Secondary | ICD-10-CM | POA: Diagnosis not present

## 2015-03-18 DIAGNOSIS — E785 Hyperlipidemia, unspecified: Secondary | ICD-10-CM | POA: Diagnosis not present

## 2015-03-21 ENCOUNTER — Encounter: Payer: Self-pay | Admitting: Family Medicine

## 2015-03-21 ENCOUNTER — Ambulatory Visit (INDEPENDENT_AMBULATORY_CARE_PROVIDER_SITE_OTHER): Payer: Medicare PPO | Admitting: Family Medicine

## 2015-03-21 VITALS — BP 150/76 | HR 62 | Temp 98.1°F | Ht 60.0 in | Wt 158.5 lb

## 2015-03-21 DIAGNOSIS — E1142 Type 2 diabetes mellitus with diabetic polyneuropathy: Secondary | ICD-10-CM | POA: Diagnosis not present

## 2015-03-21 MED ORDER — TRAMADOL HCL 50 MG PO TABS: 50.0000 mg | ORAL_TABLET | Freq: Four times a day (QID) | ORAL | Status: DC | PRN

## 2015-03-21 MED ORDER — GABAPENTIN 100 MG PO CAPS: ORAL_CAPSULE | ORAL | Status: DC

## 2015-03-21 NOTE — Progress Notes (Signed)
Pre visit review using our clinic review tool, if applicable. No additional management support is needed unless otherwise documented below in the visit note. 

## 2015-03-21 NOTE — Assessment & Plan Note (Signed)
Increase gabapentin to 100, 100, 300, 300 gradaully by 100 mg every few days until benefit.  If not improving neuropahty at next OV in 4 weeks.. Consider changing prozac to cymbalta, or amitriptyline given bedtime worst time and trial of lyrica.

## 2015-03-21 NOTE — Patient Instructions (Addendum)
Increase gabapentin to 100 mg in AM, 100 mg at midday and 300 mg at dinner and 3300 mg at bedtiem but increase 100 mg up to this dose at a time.ry to increase walking as able.  Work on low fat diet, low carb diet, and increase exercise.

## 2015-03-21 NOTE — Progress Notes (Signed)
   Subjective:    Patient ID: Ashley Ryan, female    DOB: [birth year], 70 y.o.   MRN: [id number]  HPI  70 year old female with history of diabetic neuropathy presents for follow up on foot pain.  At last check in 10/2014 DM was well controlled. A1C 5.8 She is currently taking gabapentin 1 in AM, 1 in midday and 1 tabs at dinner and 2 at night.  Makes her sleepy. She reports that at night her feet are tingling, hurting, burning on bottom of feet, hard to sleep at night. Right foot worse than the left.  Gradually getting worse in last  Month.  No numbness, no weakness in legs.  No new low back pain.  Has not tried any other med for neuropathy other than tramadol.  Used once aevery few days.Has been out for 2 months. Helped some.  She is on prozac for migraine. She has not tried to come off. No migraines any longer.  No depression, no anxiety.  Has been gaining weight. Drinking some coke.  She is unable to exercise because of knees. Tries to ride bicycle.     Review of Systems  Constitutional: Negative for fever and fatigue.  HENT: Negative for ear pain.   Eyes: Negative for pain.  Respiratory: Negative for chest tightness and shortness of breath.   Cardiovascular: Negative for chest pain, palpitations and leg swelling.  Gastrointestinal: Negative for abdominal pain.  Genitourinary: Negative for dysuria.       Objective:   Physical Exam  Constitutional: Vital signs are normal. She appears well-developed and well-nourished. She is cooperative.  Non-toxic appearance. She does not appear ill. No distress.  HENT:  Head: Normocephalic.  Right Ear: Hearing, tympanic membrane, external ear and ear canal normal. Tympanic membrane is not erythematous, not retracted and not bulging.  Left Ear: Hearing, tympanic membrane, external ear and ear canal normal. Tympanic membrane is not erythematous, not retracted and not bulging.  Nose: No mucosal edema or rhinorrhea. Right sinus exhibits no  maxillary sinus tenderness and no frontal sinus tenderness. Left sinus exhibits no maxillary sinus tenderness and no frontal sinus tenderness.  Mouth/Throat: Uvula is midline, oropharynx is clear and moist and mucous membranes are normal.  Eyes: Conjunctivae, EOM and lids are normal. Pupils are equal, round, and reactive to light. Lids are everted and swept, no foreign bodies found.  Neck: Trachea normal and normal range of motion. Neck supple. Carotid bruit is not present. No thyroid mass and no thyromegaly present.  Cardiovascular: Normal rate, regular rhythm, S1 normal, S2 normal, normal heart sounds, intact distal pulses and normal pulses.  Exam reveals no gallop and no friction rub.   No murmur heard. Pulmonary/Chest: Effort normal and breath sounds normal. No tachypnea. No respiratory distress. She has no decreased breath sounds. She has no wheezes. She has no rhonchi. She has no rales.  Abdominal: Soft. Normal appearance and bowel sounds are normal. There is no tenderness.  Neurological: She is alert.  Skin: Skin is warm, dry and intact. No rash noted.  Psychiatric: Her speech is normal and behavior is normal. Judgment and thought content normal. Her mood appears not anxious. Cognition and memory are normal. She does not exhibit a depressed mood.    Diabetic foot exam: Normal inspection No skin breakdown No calluses  Normal DP pulses Decreased sensation to light touch and monofilament up to ankle bilaterally Nails normal       Assessment & Plan:

## 2015-04-12 ENCOUNTER — Other Ambulatory Visit: Payer: Self-pay | Admitting: Family Medicine

## 2015-04-13 ENCOUNTER — Other Ambulatory Visit (INDEPENDENT_AMBULATORY_CARE_PROVIDER_SITE_OTHER): Payer: Medicare PPO

## 2015-04-13 ENCOUNTER — Telehealth: Payer: Self-pay | Admitting: Family Medicine

## 2015-04-13 DIAGNOSIS — G6289 Other specified polyneuropathies: Secondary | ICD-10-CM

## 2015-04-13 NOTE — Telephone Encounter (Signed)
Donna:I believe this lab appt is supposed to be a 4 week follow up with me... Please reschedule.

## 2015-04-13 NOTE — Telephone Encounter (Signed)
-----   Message from Ellamae Sia sent at 04/05/2015 11:35 AM EDT ----- Regarding: Lab orders for Thursday, 3.23.17 Lab orders for a 4 week f/u appt.

## 2015-04-13 NOTE — Telephone Encounter (Signed)
She is scheduled to see you on 04/18/2015 for 4 week follow up on diabetes with labs prior according to AVS.

## 2015-04-14 LAB — T4, FREE: Free T4: 0.84 ng/dL (ref 0.60–1.60)

## 2015-04-14 LAB — TSH: TSH: 2.44 u[IU]/mL (ref 0.35–4.50)

## 2015-04-14 LAB — T3, FREE: T3 FREE: 3.2 pg/mL (ref 2.3–4.2)

## 2015-04-14 LAB — VITAMIN B12: VITAMIN B 12: 165 pg/mL — AB (ref 211–911)

## 2015-04-18 ENCOUNTER — Telehealth: Payer: Self-pay | Admitting: Family Medicine

## 2015-04-18 ENCOUNTER — Encounter: Payer: Self-pay | Admitting: Family Medicine

## 2015-04-18 ENCOUNTER — Ambulatory Visit (INDEPENDENT_AMBULATORY_CARE_PROVIDER_SITE_OTHER): Payer: Medicare PPO | Admitting: Family Medicine

## 2015-04-18 VITALS — BP 142/80 | HR 64 | Temp 98.1°F | Ht 60.0 in | Wt 159.8 lb

## 2015-04-18 DIAGNOSIS — E1142 Type 2 diabetes mellitus with diabetic polyneuropathy: Secondary | ICD-10-CM

## 2015-04-18 DIAGNOSIS — E781 Pure hyperglyceridemia: Secondary | ICD-10-CM | POA: Diagnosis not present

## 2015-04-18 DIAGNOSIS — I10 Essential (primary) hypertension: Secondary | ICD-10-CM

## 2015-04-18 DIAGNOSIS — E119 Type 2 diabetes mellitus without complications: Secondary | ICD-10-CM | POA: Diagnosis not present

## 2015-04-18 LAB — COMPREHENSIVE METABOLIC PANEL
ALK PHOS: 70 U/L (ref 39–117)
ALT: 16 U/L (ref 0–35)
AST: 13 U/L (ref 0–37)
Albumin: 4.4 g/dL (ref 3.5–5.2)
BILIRUBIN TOTAL: 0.4 mg/dL (ref 0.2–1.2)
BUN: 10 mg/dL (ref 6–23)
CALCIUM: 9.7 mg/dL (ref 8.4–10.5)
CO2: 28 meq/L (ref 19–32)
CREATININE: 0.58 mg/dL (ref 0.40–1.20)
Chloride: 103 mEq/L (ref 96–112)
GFR: 108.65 mL/min (ref >60.00)
GLUCOSE: 97 mg/dL (ref 70–99)
Potassium: 4.2 mEq/L (ref 3.5–5.1)
Sodium: 138 mEq/L (ref 135–145)
TOTAL PROTEIN: 7.2 g/dL (ref 6.0–8.3)

## 2015-04-18 LAB — LIPID PANEL
CHOL/HDL RATIO: 3
CHOLESTEROL: 181 mg/dL (ref 0–200)
HDL: 56.3 mg/dL (ref >39.00)
LDL Cholesterol: 86 mg/dL (ref 0–99)
NonHDL: 124.79
TRIGLYCERIDES: 192 mg/dL — AB (ref 0.0–149.0)
VLDL: 38.4 mg/dL (ref 0.0–40.0)

## 2015-04-18 LAB — HM DIABETES FOOT EXAM

## 2015-04-18 LAB — HEMOGLOBIN A1C: Hgb A1c MFr Bld: 5.9 % (ref 4.6–6.5)

## 2015-04-18 MED ORDER — LOSARTAN POTASSIUM-HCTZ 100-25 MG PO TABS: 1.0000 | ORAL_TABLET | Freq: Every day | ORAL | Status: DC

## 2015-04-18 MED ORDER — TRAMADOL HCL 50 MG PO TABS: 50.0000 mg | ORAL_TABLET | Freq: Four times a day (QID) | ORAL | Status: DC | PRN

## 2015-04-18 NOTE — Assessment & Plan Note (Signed)
Doing better on gabapentin higher dose. Also now started B12 supplement.

## 2015-04-18 NOTE — Addendum Note (Signed)
Addended by: Ellamae Sia on: 04/18/2015 09:32 AM   Modules accepted: Orders

## 2015-04-18 NOTE — Telephone Encounter (Signed)
See result note on labs from 04/18/2015.

## 2015-04-18 NOTE — Assessment & Plan Note (Signed)
Due for re-eval. Encouraged exercise, weight loss, healthy eating habits.

## 2015-04-18 NOTE — Patient Instructions (Addendum)
Continue B12 supplement.  Work on exercise , healthy eating and weight loss.  Try to lower salt in diet if able.  Stop at lab on way on out.  Once we  Get the lab results....  Stop amlodipine and HCTZ.  Start losartan HCTZ daily.  Follow BP at home, goal < 140/90.

## 2015-04-18 NOTE — Progress Notes (Signed)
Subjective:    Patient ID: Ashley Ryan, female    DOB: [birth year], 70 y.o.   MRN: [id number]  HPI   70 year old female presents for 6 month follow up on DM.  Diabetes: Well controlled at last check on no medication.  Due for re-eval. Lab Results  Component Value Date   HGBA1C 5.8 11/03/2014  Using medications without difficulties:None  Hypoglycemic episodes:None  Hyperglycemic episodes:NONE  Feet problems: DM neuropathy: improved on gabapentin, takes one in AM and 3 tabs at night. Moderate control. Blood Sugars averaging: Has not checking in a while Eye exam within last year: yes.   Hypertension: Previously at goal < 130/80 on amlodipine, HCTZ, atenolol.   BP Readings from Last 3 Encounters:  04/18/15 142/80  03/21/15 150/76  11/10/14 129/63  Using medication without problems or lightheadedness: None  Chest pain with exertion:None  Edema: worsened lately Short of breath:Yes, with extreme exertion Average home BPs: Recently has been 150-160/80. Other issues:  Wt Readings from Last 3 Encounters:  04/18/15 159 lb 12.8 oz (72.485 kg)  03/21/15 158 lb 8 oz (71.895 kg)  11/10/14 158 lb 12 oz (72.009 kg)      Elevated Cholesterol: LDLpreviously at goal <100, triglycerides above goal. On no med.   Recent Labs    Lab Results  Component Value Date   CHOL 178 11/03/2014   HDL 52.60 11/03/2014   LDLCALC 91 11/03/2014   LDLDIRECT 102.5 07/21/2009   TRIG 173.0* 11/03/2014   CHOLHDL 3 11/03/2014     Diet compliance: Good  Exercise: No dedicated exercise, back at work moving more  Other complaints:    Does history of gastric ulcer years ago.  On nexium daily.   Peripheral neuropathy:  At last OV in 02/2014.Marland KitchenMarland KitchenIncreased gabapentin to 100, 100, 300, 300 gradaully by 100 mg every few days until benefit.  B12 low. Started supplement in last few days Thyroid nml. Today she reports                Review of Systems    Constitutional: Negative for fever and fatigue.  HENT: Negative for ear pain.   Eyes: Negative for pain.  Respiratory: Negative for chest tightness and shortness of breath.   Cardiovascular: Negative for chest pain, palpitations and leg swelling.  Gastrointestinal: Negative for abdominal pain.  Genitourinary: Negative for dysuria.       Objective:   Physical Exam  Constitutional: Vital signs are normal. She appears well-developed and well-nourished. She is cooperative.  Non-toxic appearance. She does not appear ill. No distress.  HENT:  Head: Normocephalic.  Right Ear: Hearing, tympanic membrane, external ear and ear canal normal. Tympanic membrane is not erythematous, not retracted and not bulging.  Left Ear: Hearing, tympanic membrane, external ear and ear canal normal. Tympanic membrane is not erythematous, not retracted and not bulging.  Nose: No mucosal edema or rhinorrhea. Right sinus exhibits no maxillary sinus tenderness and no frontal sinus tenderness. Left sinus exhibits no maxillary sinus tenderness and no frontal sinus tenderness.  Mouth/Throat: Uvula is midline, oropharynx is clear and moist and mucous membranes are normal.  Eyes: Conjunctivae, EOM and lids are normal. Pupils are equal, round, and reactive to light. Lids are everted and swept, no foreign bodies found.  Neck: Trachea normal and normal range of motion. Neck supple. Carotid bruit is not present. No thyroid mass and no thyromegaly present.  Cardiovascular: Normal rate, regular rhythm, S1 normal, S2 normal, normal heart sounds, intact distal pulses and normal  pulses.  Exam reveals no gallop and no friction rub.   No murmur heard.  1 plus bilateral swelling  Pulmonary/Chest: Effort normal and breath sounds normal. No tachypnea. No respiratory distress. She has no decreased breath sounds. She has no wheezes. She has no rhonchi. She has no rales.  Abdominal: Soft. Normal appearance and bowel sounds are normal. There  is no tenderness.  Neurological: She is alert.  Skin: Skin is warm, dry and intact. No rash noted.  Psychiatric: Her speech is normal and behavior is normal. Judgment and thought content normal. Her mood appears not anxious. Cognition and memory are normal. She does not exhibit a depressed mood.    Diabetic foot exam: Normal inspection No skin breakdown No calluses  Normal DP pulses No sensation to light touch and monofilament Nails normal       Assessment & Plan:

## 2015-04-18 NOTE — Telephone Encounter (Signed)
Pt called requesting call back with lab results. Please advise

## 2015-04-18 NOTE — Progress Notes (Signed)
Pre visit review using our clinic review tool, if applicable. No additional management support is needed unless otherwise documented below in the visit note. 

## 2015-04-18 NOTE — Assessment & Plan Note (Addendum)
Inadeqaute control. Eval CMET.  if nml. Stop amlodipine and HCTZ and change to losartan HCTZ.  Follow BP, follow up in 2 weeks with cr check.

## 2015-05-02 ENCOUNTER — Ambulatory Visit: Payer: Medicare PPO | Admitting: Family Medicine

## 2015-05-04 ENCOUNTER — Other Ambulatory Visit: Payer: Self-pay | Admitting: Family Medicine

## 2015-05-11 ENCOUNTER — Other Ambulatory Visit: Payer: Self-pay | Admitting: Gastroenterology

## 2015-05-11 ENCOUNTER — Ambulatory Visit: Payer: Medicare PPO | Admitting: Family Medicine

## 2015-05-28 ENCOUNTER — Other Ambulatory Visit: Payer: Self-pay | Admitting: Family Medicine

## 2015-05-30 ENCOUNTER — Other Ambulatory Visit: Payer: Self-pay | Admitting: Family Medicine

## 2015-06-06 ENCOUNTER — Ambulatory Visit (INDEPENDENT_AMBULATORY_CARE_PROVIDER_SITE_OTHER): Payer: Medicare PPO | Admitting: Family Medicine

## 2015-06-06 ENCOUNTER — Encounter: Payer: Self-pay | Admitting: Family Medicine

## 2015-06-06 ENCOUNTER — Ambulatory Visit: Payer: Medicare PPO | Admitting: Family Medicine

## 2015-06-06 ENCOUNTER — Telehealth: Payer: Self-pay | Admitting: *Deleted

## 2015-06-06 VITALS — BP 140/70 | HR 68 | Temp 97.7°F | Ht 60.0 in | Wt 155.5 lb

## 2015-06-06 DIAGNOSIS — Z111 Encounter for screening for respiratory tuberculosis: Secondary | ICD-10-CM

## 2015-06-06 DIAGNOSIS — E1142 Type 2 diabetes mellitus with diabetic polyneuropathy: Secondary | ICD-10-CM | POA: Diagnosis not present

## 2015-06-06 DIAGNOSIS — I10 Essential (primary) hypertension: Secondary | ICD-10-CM | POA: Diagnosis not present

## 2015-06-06 MED ORDER — LIDOCAINE 5 % EX PTCH: 1.0000 | MEDICATED_PATCH | CUTANEOUS | Status: DC

## 2015-06-06 NOTE — Progress Notes (Signed)
   Subjective:    Patient ID: Ashley Ryan, female    DOB: Apr 10, 1945, 70 y.o.   MRN: [id number]  HPI  70 year old female presents for follow up blood pressure. At a follow up 1 month ago on 3/28 , pt had inadequate control of BP on amlodipine and HCTZ.  She was changed to losartan HCTZ and continued amlodipine 5 mg daily. BP Readings from Last 3 Encounters:  06/06/15 140/70  04/18/15 142/80  03/21/15 150/76    Using medication without problems or lightheadedness:  None Chest pain with exertion: None Edema:None Short of breath:none Average home BPs: 137/70 Other issues:   She has lost 5 lbs cutting back on soda. Increasing exercise. Wt Readings from Last 3 Encounters:  06/06/15 155 lb 8 oz (70.534 kg)  04/18/15 159 lb 12.8 oz (72.485 kg)  03/21/15 158 lb 8 oz (71.895 kg)     She has tried lidocaine patch on toes for peripheral neuropathy given 300 mg of gabapentin at dinner was making her too sleepy.  She has cut it down to 200 mg at supper.  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 Needs PPD.   Review of Systems  Constitutional: Negative for fever and fatigue.  HENT: Negative for ear pain.   Eyes: Negative for pain.  Respiratory: Negative for chest tightness and shortness of breath.   Cardiovascular: Negative for chest pain, palpitations and leg swelling.  Gastrointestinal: Negative for abdominal pain.  Genitourinary: Negative for dysuria.       Objective:   Physical Exam  Constitutional: Vital signs are normal. She appears well-developed and well-nourished. She is cooperative.  Non-toxic appearance. She does not appear ill. No distress.  HENT:  Head: Normocephalic.  Right Ear: Hearing, tympanic membrane, external ear and ear canal normal. Tympanic membrane is not erythematous, not retracted and not bulging.  Left Ear: Hearing, tympanic membrane, external ear and ear canal normal. Tympanic membrane is not erythematous, not retracted and not bulging.   Nose: No mucosal edema or rhinorrhea. Right sinus exhibits no maxillary sinus tenderness and no frontal sinus tenderness. Left sinus exhibits no maxillary sinus tenderness and no frontal sinus tenderness.  Mouth/Throat: Uvula is midline, oropharynx is clear and moist and mucous membranes are normal.  Eyes: Conjunctivae, EOM and lids are normal. Pupils are equal, round, and reactive to light. Lids are everted and swept, no foreign bodies found.  Neck: Trachea normal and normal range of motion. Neck supple. Carotid bruit is not present. No thyroid mass and no thyromegaly present.  Cardiovascular: Normal rate, regular rhythm, S1 normal, S2 normal, normal heart sounds, intact distal pulses and normal pulses.  Exam reveals no gallop and no friction rub.   No murmur heard. Pulmonary/Chest: Effort normal and breath sounds normal. No tachypnea. No respiratory distress. She has no decreased breath sounds. She has no wheezes. She has no rhonchi. She has no rales.  Abdominal: Soft. Normal appearance and bowel sounds are normal. There is no tenderness.  Neurological: She is alert.  Skin: Skin is warm, dry and intact. No rash noted.  Psychiatric: Her speech is normal and behavior is normal. Judgment and thought content normal. Her mood appears not anxious. Cognition and memory are normal. She does not exhibit a depressed mood.          Assessment & Plan:

## 2015-06-06 NOTE — Assessment & Plan Note (Signed)
Improved on lower dose of gabapentin  Now that using lidiocaine patches. Refill sent.

## 2015-06-06 NOTE — Progress Notes (Signed)
Pre visit review using our clinic review tool, if applicable. No additional management support is needed unless otherwise documented below in the visit note. 

## 2015-06-06 NOTE — Addendum Note (Signed)
Addended by: Carter Kitten on: 06/06/2015 07:57 AM   Modules accepted: Orders, Medications

## 2015-06-06 NOTE — Assessment & Plan Note (Signed)
Improved control on  losratn HCTZ and amlodipine. Follow BP at home. Continue weigh loss and exercise.

## 2015-06-06 NOTE — Patient Instructions (Signed)
Continue current blood pressure medications.. Amlodipine and losartan HCTZ.  Can use lidocaine patches for peripheral neuropathy.

## 2015-06-06 NOTE — Telephone Encounter (Signed)
Received fax from [facility] in [place] requesting PA for Lidocaine Patch 5%.  PA completed on CoverMyMeds.  PA went for review.  Awaiting decision.

## 2015-06-07 NOTE — Telephone Encounter (Signed)
PA approved until 12/03/2015.  [facility] notified.

## 2015-06-08 LAB — TB SKIN TEST
Induration: 1 mm
TB Skin Test: NEGATIVE

## 2015-06-21 ENCOUNTER — Other Ambulatory Visit: Payer: Self-pay | Admitting: Family Medicine

## 2015-06-26 ENCOUNTER — Other Ambulatory Visit: Payer: Self-pay | Admitting: Family Medicine

## 2015-07-04 ENCOUNTER — Other Ambulatory Visit: Payer: Self-pay | Admitting: *Deleted

## 2015-07-04 MED ORDER — ATENOLOL 100 MG PO TABS: ORAL_TABLET | ORAL | Status: DC

## 2015-07-12 ENCOUNTER — Other Ambulatory Visit: Payer: Self-pay | Admitting: Family Medicine

## 2015-07-12 NOTE — Telephone Encounter (Signed)
Last office visit 06/06/2015.  Last refilled 04/18/2015 for #30 with no refills.  Ok to refill?

## 2015-07-13 NOTE — Telephone Encounter (Signed)
[facility]adol called into [facility] in [place].

## 2015-07-27 ENCOUNTER — Other Ambulatory Visit: Payer: Self-pay | Admitting: Family Medicine

## 2015-07-27 NOTE — Telephone Encounter (Signed)
Last office visit 06/06/15.  Last refilled 06/06/15 for #30 with no refills.  Ok to refill?

## 2015-08-02 ENCOUNTER — Other Ambulatory Visit: Payer: Self-pay | Admitting: Family Medicine

## 2015-08-02 NOTE — Telephone Encounter (Signed)
Last office visit 06/06/2015.  Last refilled 03/21/2015 for #180 with 5 refills. Ok to refill?

## 2015-09-04 ENCOUNTER — Other Ambulatory Visit: Payer: Self-pay | Admitting: Family Medicine

## 2015-09-07 ENCOUNTER — Other Ambulatory Visit: Payer: Self-pay | Admitting: *Deleted

## 2015-09-07 MED ORDER — AMLODIPINE BESYLATE 5 MG PO TABS: 5.0000 mg | ORAL_TABLET | Freq: Every day | ORAL | 1 refills | Status: DC

## 2015-09-07 MED ORDER — LIDOCAINE 5 % EX PTCH: MEDICATED_PATCH | CUTANEOUS | 0 refills | Status: DC

## 2015-09-07 MED ORDER — FLUOXETINE HCL 20 MG PO CAPS: 20.0000 mg | ORAL_CAPSULE | Freq: Every day | ORAL | 1 refills | Status: DC

## 2015-09-08 MED ORDER — LIDOCAINE 5 % EX PTCH: MEDICATED_PATCH | CUTANEOUS | 0 refills | Status: DC

## 2015-09-08 NOTE — Addendum Note (Signed)
Addended by: Carter Kitten on: 09/08/2015 04:43 PM   Modules accepted: Orders

## 2015-10-12 ENCOUNTER — Other Ambulatory Visit: Payer: Self-pay | Admitting: Family Medicine

## 2015-10-12 NOTE — Telephone Encounter (Signed)
Last office visit 06/06/2015.  Last refilled 08/21/2015 for #180 with 5 refills.  According to dosing instructions, patient would need #240 per month.  Ok to increase quantity/refill?

## 2015-10-24 ENCOUNTER — Other Ambulatory Visit: Payer: Self-pay | Admitting: Family Medicine

## 2015-10-24 NOTE — Telephone Encounter (Signed)
[facility] called into [facility] in [place].

## 2015-10-24 NOTE — Telephone Encounter (Signed)
Last office visit 06/06/2015.  Last refilled 07/13/2015 for #30 with no refills.  Ok to refill?

## 2015-11-09 ENCOUNTER — Encounter: Payer: Self-pay | Admitting: Family Medicine

## 2015-11-09 ENCOUNTER — Other Ambulatory Visit (INDEPENDENT_AMBULATORY_CARE_PROVIDER_SITE_OTHER): Payer: Medicare PPO

## 2015-11-09 ENCOUNTER — Telehealth: Payer: Self-pay | Admitting: Family Medicine

## 2015-11-09 ENCOUNTER — Ambulatory Visit (INDEPENDENT_AMBULATORY_CARE_PROVIDER_SITE_OTHER): Payer: Medicare PPO

## 2015-11-09 ENCOUNTER — Other Ambulatory Visit: Payer: Medicare PPO

## 2015-11-09 VITALS — BP 124/70 | HR 63 | Temp 98.4°F | Ht 60.0 in | Wt 156.0 lb

## 2015-11-09 DIAGNOSIS — Z0000 Encounter for general adult medical examination without abnormal findings: Secondary | ICD-10-CM

## 2015-11-09 DIAGNOSIS — Z23 Encounter for immunization: Secondary | ICD-10-CM

## 2015-11-09 DIAGNOSIS — E119 Type 2 diabetes mellitus without complications: Secondary | ICD-10-CM

## 2015-11-09 DIAGNOSIS — D509 Iron deficiency anemia, unspecified: Secondary | ICD-10-CM | POA: Diagnosis not present

## 2015-11-09 DIAGNOSIS — M810 Age-related osteoporosis without current pathological fracture: Secondary | ICD-10-CM | POA: Diagnosis not present

## 2015-11-09 DIAGNOSIS — E781 Pure hyperglyceridemia: Secondary | ICD-10-CM

## 2015-11-09 LAB — CBC WITH DIFFERENTIAL/PLATELET
BASOS ABS: 0 10*3/uL (ref 0.0–0.1)
BASOS PCT: 0.8 % (ref 0.0–3.0)
Eosinophils Absolute: 0.1 10*3/uL (ref 0.0–0.7)
Eosinophils Relative: 1.9 % (ref 0.0–5.0)
HEMATOCRIT: 35 % — AB (ref 36.0–46.0)
HEMOGLOBIN: 11.4 g/dL — AB (ref 12.0–15.0)
LYMPHS PCT: 25.6 % (ref 12.0–46.0)
Lymphs Abs: 1.3 10*3/uL (ref 0.7–4.0)
MCHC: 32.6 g/dL (ref 30.0–36.0)
MCV: 81.3 fl (ref 78.0–100.0)
MONOS PCT: 9.3 % (ref 3.0–12.0)
Monocytes Absolute: 0.5 10*3/uL (ref 0.1–1.0)
NEUTROS ABS: 3.1 10*3/uL (ref 1.4–7.7)
Neutrophils Relative %: 62.4 % (ref 43.0–77.0)
PLATELETS: 200 10*3/uL (ref 150.0–400.0)
RBC: 4.31 Mil/uL (ref 3.87–5.11)
RDW: 16.5 % — AB (ref 11.5–15.5)
WBC: 4.9 10*3/uL (ref 4.0–10.5)

## 2015-11-09 LAB — COMPREHENSIVE METABOLIC PANEL
ALBUMIN: 4.4 g/dL (ref 3.5–5.2)
ALK PHOS: 61 U/L (ref 39–117)
ALT: 11 U/L (ref 0–35)
AST: 12 U/L (ref 0–37)
BILIRUBIN TOTAL: 0.4 mg/dL (ref 0.2–1.2)
BUN: 12 mg/dL (ref 6–23)
CALCIUM: 9.6 mg/dL (ref 8.4–10.5)
CO2: 30 meq/L (ref 19–32)
CREATININE: 0.58 mg/dL (ref 0.40–1.20)
Chloride: 102 mEq/L (ref 96–112)
GFR: 108.47 mL/min (ref >60.00)
Glucose, Bld: 85 mg/dL (ref 70–99)
Potassium: 4.3 mEq/L (ref 3.5–5.1)
Sodium: 139 mEq/L (ref 135–145)
Total Protein: 7.1 g/dL (ref 6.0–8.3)

## 2015-11-09 LAB — LIPID PANEL
CHOLESTEROL: 194 mg/dL (ref 0–200)
HDL: 56 mg/dL (ref >39.00)
LDL CALC: 107 mg/dL — AB (ref 0–99)
NonHDL: 138.49
TRIGLYCERIDES: 156 mg/dL — AB (ref 0.0–149.0)
Total CHOL/HDL Ratio: 3
VLDL: 31.2 mg/dL (ref 0.0–40.0)

## 2015-11-09 LAB — VITAMIN D 25 HYDROXY (VIT D DEFICIENCY, FRACTURES): VITD: 34.64 ng/mL (ref 30.00–100.00)

## 2015-11-09 LAB — HEMOGLOBIN A1C: HEMOGLOBIN A1C: 5.9 % (ref 4.6–6.5)

## 2015-11-09 NOTE — Patient Instructions (Signed)
Ashley Ryan , Thank you for taking time to come for your Medicare Wellness Visit. I appreciate your ongoing commitment to your health goals. Please review the following plan we discussed and let me know if I can assist you in the future.   These are the goals we discussed: Goals    . Increase physical activity          Starting 11/09/2015, I will continue to exercise for at least 15 min 2 days per week.        This is a list of the screening recommended for you and due dates:  Health Maintenance  Topic Date Due  . Eye exam for diabetics  01/21/2016*  . Shingles Vaccine  11/08/2025*  . Complete foot exam   04/17/2016  . Hemoglobin A1C  05/09/2016  . Mammogram  07/04/2016  . Colon Cancer Screening  09/10/2018  . Tetanus Vaccine  10/14/2021  . Flu Shot  Completed  . DEXA scan (bone density measurement)  Completed  .  Hepatitis C: One time screening is recommended by Center for Disease Control  (CDC) for  adults born from [birth year] through 1967.   Completed  . Pneumonia vaccines  Completed  *Topic was postponed. The date shown is not the original due date.   Preventive Care for Adults  A healthy lifestyle and preventive care can promote health and wellness. Preventive health guidelines for adults include the following key practices.  . A routine yearly physical is a good way to check with your health care provider about your health and preventive screening. It is a chance to share any concerns and updates on your health and to receive a thorough exam.  . Visit your dentist for a routine exam and preventive care every 6 months. Brush your teeth twice a day and floss once a day. Good oral hygiene prevents tooth decay and gum disease.  . The frequency of eye exams is based on your age, health, family medical history, use  of contact lenses, and other factors. Follow your health care provider's ecommendations for frequency of eye exams.  . Eat a healthy diet. Foods like vegetables, fruits,  whole grains, low-fat dairy products, and lean protein foods contain the nutrients you need without too many calories. Decrease your intake of foods high in solid fats, added sugars, and salt. Eat the right amount of calories for you. Get information about a proper diet from your health care provider, if necessary.  . Regular physical exercise is one of the most important things you can do for your health. Most adults should get at least 150 minutes of moderate-intensity exercise (any activity that increases your heart rate and causes you to sweat) each week. In addition, most adults need muscle-strengthening exercises on 2 or more days a week.  Silver Sneakers may be a benefit available to you. To determine eligibility, you may visit the website: www.silversneakers.com or contact program at (743) 310-3093 Mon-Fri between 8AM-8PM.   . Maintain a healthy weight. The body mass index (BMI) is a screening tool to identify possible weight problems. It provides an estimate of body fat based on height and weight. Your health care provider can find your BMI and can help you achieve or maintain a healthy weight.   For adults 20 years and older: ? A BMI below 18.5 is considered underweight. ? A BMI of 18.5 to 24.9 is normal. ? A BMI of 25 to 29.9 is considered overweight. ? A BMI of 30 and above is  considered obese.   . Maintain normal blood lipids and cholesterol levels by exercising and minimizing your intake of saturated fat. Eat a balanced diet with plenty of fruit and vegetables. Blood tests for lipids and cholesterol should begin at age 51 and be repeated every 5 years. If your lipid or cholesterol levels are high, you are over 50, or you are at high risk for heart disease, you may need your cholesterol levels checked more frequently. Ongoing high lipid and cholesterol levels should be treated with medicines if diet and exercise are not working.  . If you smoke, find out from your health care provider  how to quit. If you do not use tobacco, please do not start.  . If you choose to drink alcohol, please do not consume more than 2 drinks per day. One drink is considered to be 12 ounces (355 mL) of beer, 5 ounces (148 mL) of wine, or 1.5 ounces (44 mL) of liquor.  . If you are 60-43 years old, ask your health care provider if you should take aspirin to prevent strokes.  . Use sunscreen. Apply sunscreen liberally and repeatedly throughout the day. You should seek shade when your shadow is shorter than you. Protect yourself by wearing long sleeves, pants, a wide-brimmed hat, and sunglasses year round, whenever you are outdoors.  . Once a month, do a whole body skin exam, using a mirror to look at the skin on your back. Tell your health care provider of new moles, moles that have irregular borders, moles that are larger than a pencil eraser, or moles that have changed in shape or color.

## 2015-11-09 NOTE — Progress Notes (Signed)
PCP notes:   Health maintenance:  Flu vaccine - administered PPSV23 - administered Eye exam - pt plans to schedule future appt A1C - completed Shingles - pt declined due to cost  Abnormal screenings:   Hearing - failed Mini-Cog score: 19/20 Fall risk - hx of multiple falls without injury  Patient concerns:   Pt voiced concerns about neuropathy in both feet. Pt states neuropathy attributes to falls and is a barrier to walking for exercise. Pt reports a hard "pebble-like" area to bottom of right foot. Pt has been encouraged to share these concerns with PCP and to discuss a podiatry referral, if necessary.  Nurse concerns:  None  Next PCP appt:   11/21/2015 @ 0830

## 2015-11-09 NOTE — Telephone Encounter (Signed)
-----   Message from Ellamae Sia sent at 11/02/2015 12:01 PM EDT ----- Regarding: Lab orders for Thursday, 10.19.17 Patient is scheduled for CPX labs, please order future labs, Thanks , Karna Christmas

## 2015-11-09 NOTE — Progress Notes (Signed)
Pre visit review using our clinic review tool, if applicable. No additional management support is needed unless otherwise documented below in the visit note. 

## 2015-11-09 NOTE — Progress Notes (Signed)
I reviewed health advisor's note, was available for consultation, and agree with documentation and plan.  Amy Bedsole, MD [facility]  

## 2015-11-09 NOTE — Progress Notes (Signed)
Subjective:   Ashley Ryan is a 70 y.o. female who presents for Medicare Annual (Subsequent) preventive examination.  Review of Systems:  N/A Cardiac Risk Factors include: advanced age (>64mn, >>61women);obesity (BMI >30kg/m2);diabetes mellitus;hypertension     Objective:     Vitals: BP 124/70 (BP Location: Left Arm, Patient Position: Sitting, Cuff Size: Normal)   Pulse 63   Temp 98.4 F (36.9 C) (Oral)   Ht 5' (1.524 m)   Wt 156 lb (70.8 kg)   SpO2 95%   BMI 30.47 kg/m   Body mass index is 30.47 kg/m.   Tobacco History  Smoking Status  . Never Smoker  Smokeless Tobacco  . Never Used     Counseling given: No   Past Medical History:  Diagnosis Date  . Arthritis   . Chronic headache   . Diverticulosis of colon   . GERD with stricture   . Hiatal hernia   . Hypertension    Past Surgical History:  Procedure Laterality Date  . ABDOMINAL HYSTERECTOMY     one ovary remains  . CHOLECYSTECTOMY     Family History  Problem Relation Age of Onset  . Breast cancer    . Stomach cancer    . Diabetes    . Heart disease    . Breast cancer Sister 564  History  Sexual Activity  . Sexual activity: No    Outpatient Encounter Prescriptions as of 11/09/2015  Medication Sig  . ACCU-CHEK SOFTCLIX LANCETS lancets Use to check blood sugar once daily  . Alcohol Swabs (B-D SINGLE USE SWABS REGULAR) PADS Use to check blood sugar once daily  . amLODipine (NORVASC) 5 MG tablet Take 1 tablet (5 mg total) by mouth daily.  .Marland Kitchenaspirin EC 81 MG tablet Take 81 mg by mouth.  .Marland Kitchenatenolol (TENORMIN) 100 MG tablet TAKE 1 TABLET (100 MG TOTAL) BY MOUTH DAILY.  .Marland KitchenBlood Glucose Calibration (ACCU-CHEK AVIVA) SOLN Use to check blood sugar once daily  . Blood Glucose Monitoring Suppl (ACCU-CHEK AVIVA PLUS) W/DEVICE KIT Use to check blood sugar once daily  . Calcium Carbonate-Vitamin D (CALCIUM 600+D) 600-400 MG-UNIT per tablet Take 1 tablet by mouth daily.    .Marland Kitchenesomeprazole (NEXIUM) 40 MG  capsule TAKE 1 CAPSULE (40 MG TOTAL) BY MOUTH 2 (TWO) TIMES DAILY BEFORE A MEAL.  .Marland KitchenFLUoxetine (PROZAC) 20 MG capsule Take 1 capsule (20 mg total) by mouth daily.  . fluticasone (FLONASE) 50 MCG/ACT nasal spray PLACE 2 SPRAYS INTO THE NOSE DAILY.  .Marland Kitchengabapentin (NEURONTIN) 100 MG capsule TAKE 1 CAPSULE IN AM, 1 CAPSULE AT LUNCH, 3 CAPSULES AT DINNER AND 3 CAPSULES AT BEDTIME (Patient taking differently: TAKE 1 CAPSULE IN AM, 1 CAPSULE AT LUNCH, 1 CAPSULES AT DINNER AND 2-3 CAPSULES AT BEDTIME)  . Glucosamine 500 MG TABS Take 1 tablet by mouth daily.    .Marland Kitchenglucose blood (ACCU-CHEK AVIVA) test strip Use to check blood sugar once daily  . latanoprost (XALATAN) 0.005 % ophthalmic solution PLACE 1 DROP EVRYDAY INTO BOTH EYES  . lidocaine (LIDODERM) 5 % PLACE 1 PATCH ONTO THE SKIN DAILY. REMOVE & DISCARD PATCH WITHIN 12 HOURS OR AS DIRECTED BY MD  . Loratadine (CLARITIN) 10 MG CAPS Take 1 capsule by mouth as needed.    .Marland Kitchenlosartan-hydrochlorothiazide (HYZAAR) 100-25 MG tablet Take 1 tablet by mouth daily.  . traMADol (ULTRAM) 50 MG tablet TAKE 1 TABLET BY MOUTH EVERY 6 HOURS AS NEEDED  . HYDROcodone-acetaminophen (NORCO/VICODIN) 5-325 MG tablet  Take 1 tablet by mouth 3 (three) times daily as needed for moderate pain. (Patient not taking: Reported on 11/09/2015)   No facility-administered encounter medications on file as of 11/09/2015.     Activities of Daily Living In your present state of health, do you have any difficulty performing the following activities: 11/09/2015  Hearing? Y  Vision? Y  Difficulty concentrating or making decisions? Y  Walking or climbing stairs? Y  Dressing or bathing? N  Doing errands, shopping? N  Preparing Food and eating ? N  Using the Toilet? N  In the past six months, have you accidently leaked urine? Y  Do you have problems with loss of bowel control? N  Managing your Medications? N  Managing your Finances? N  Housekeeping or managing your Housekeeping? N  Some  recent data might be hidden    Patient Care Team: [name], MD as PCP - General Leandrew Koyanagi, MD as Referring Physician (Ophthalmology)    Assessment:     Hearing Screening   '125Hz'$  '250Hz'$  '500Hz'$  '1000Hz'$  '2000Hz'$  '3000Hz'$  '4000Hz'$  '6000Hz'$  '8000Hz'$   Right ear:   40 0 40  0    Left ear:   0 0 0  0      Visual Acuity Screening   Right eye Left eye Both eyes  Without correction:     With correction: 20/50 20/200 20/50    Exercise Activities and Dietary recommendations Current Exercise Habits: Home exercise routine, Type of exercise: walking;Other - see comments (rides bike), Time (Minutes): 15, Frequency (Times/Week): 2, Weekly Exercise (Minutes/Week): 30, Intensity: Mild, Exercise limited by: None identified  Goals    . Increase physical activity          Starting 11/09/2015, I will continue to exercise for at least 15 min 2 days per week.       Fall Risk Fall Risk  11/09/2015 11/10/2014 08/10/2013 04/02/2012  Falls in the past year? Yes Yes No No  Number falls in past yr: 2 or more 2 or more - -  Injury with Fall? No Yes - -  Risk Factor Category  High Fall Risk High Fall Risk - -  Risk for fall due to : History of fall(s);Other (Comment) History of fall(s) - -  Risk for fall due to (comments): pt has neuropathy in both feet - - -  Follow up Falls evaluation completed;Falls prevention discussed - - -   Depression Screen PHQ 2/9 Scores 11/09/2015 11/10/2014 08/10/2013 04/02/2012  PHQ - 2 Score 0 0 0 0     Cognitive Function MMSE - Mini Mental State Exam 11/09/2015  Orientation to time 5  Orientation to Place 5  Registration 3  Attention/ Calculation 0  Recall 2  Recall-comments pt was unable to recall 1 of 3 words  Language- name 2 objects 0  Language- repeat 1  Language- follow 3 step command 3  Language- read & follow direction 0  Write a sentence 0  Copy design 0  Total score 19     PLEASE NOTE: A Mini-Cog screen was completed. Maximum score is 20. A value of 0  denotes this part of Folstein MMSE was not completed or the patient failed this part of the Mini-Cog screening.   Mini-Cog Screening Orientation to Time - Max 5 pts Orientation to Place - Max 5 pts Registration - Max 3 pts Recall - Max 3 pts Language Repeat - Max 1 pts Language Follow 3 Step Command - Max 3 pts  Immunization History  Administered Date(s) Administered  . Influenza Split 10/08/2010, 10/15/2011  . Influenza Whole 11/03/2008, 11/16/2009  . Influenza,inj,Quad PF,36+ Mos 10/29/2013, 11/01/2014, 11/09/2015  . PPD Test 05/03/2011, 05/04/2012, 06/06/2015  . Pneumococcal Conjugate-13 10/29/2013  . Pneumococcal Polysaccharide-23 12/22/2007, 11/09/2015  . Td 01/22/2007  . Tdap 10/15/2011   Screening Tests Health Maintenance  Topic Date Due  . OPHTHALMOLOGY EXAM  01/21/2016 (Originally 05/24/2015)  . ZOSTAVAX  11/08/2025 (Originally 05/26/2003)  . FOOT EXAM  04/17/2016  . HEMOGLOBIN A1C  05/09/2016  . MAMMOGRAM  07/04/2016  . COLONOSCOPY  09/10/2018  . TETANUS/TDAP  10/14/2021  . INFLUENZA VACCINE  Completed  . DEXA SCAN  Completed  . Hepatitis C Screening  Completed  . PNA vac Low Risk Adult  Completed      Plan:     I have personally reviewed and addressed the Medicare Annual Wellness questionnaire and have noted the following in the patient's chart:  A. Medical and social history B. Use of alcohol, tobacco or illicit drugs  C. Current medications and supplements D. Functional ability and status E.  Nutritional status F.  Physical activity G. Advance directives H. List of other physicians I.  Hospitalizations, surgeries, and ER visits in previous 12 months J.  Cliff to include hearing, vision, cognitive, depression L. Referrals and appointments - none  In addition, I have reviewed and discussed with patient certain preventive protocols, quality metrics, and best practice recommendations. A written personalized care plan for preventive  services as well as general preventive health recommendations were provided to patient.  See attached scanned questionnaire for additional information.   Signed,   Lindell Noe, MHA, BS, LPN Health Coach

## 2015-11-14 ENCOUNTER — Encounter: Payer: Medicare PPO | Admitting: Family Medicine

## 2015-11-21 ENCOUNTER — Encounter: Payer: Self-pay | Admitting: Family Medicine

## 2015-11-21 ENCOUNTER — Ambulatory Visit (INDEPENDENT_AMBULATORY_CARE_PROVIDER_SITE_OTHER): Payer: Medicare PPO | Admitting: Family Medicine

## 2015-11-21 VITALS — BP 139/72 | HR 56 | Temp 97.7°F | Ht 60.0 in | Wt 158.0 lb

## 2015-11-21 DIAGNOSIS — H9113 Presbycusis, bilateral: Secondary | ICD-10-CM

## 2015-11-21 DIAGNOSIS — E1140 Type 2 diabetes mellitus with diabetic neuropathy, unspecified: Secondary | ICD-10-CM

## 2015-11-21 DIAGNOSIS — I10 Essential (primary) hypertension: Secondary | ICD-10-CM

## 2015-11-21 DIAGNOSIS — E1142 Type 2 diabetes mellitus with diabetic polyneuropathy: Secondary | ICD-10-CM | POA: Diagnosis not present

## 2015-11-21 DIAGNOSIS — E781 Pure hyperglyceridemia: Secondary | ICD-10-CM

## 2015-11-21 DIAGNOSIS — D509 Iron deficiency anemia, unspecified: Secondary | ICD-10-CM

## 2015-11-21 LAB — HM DIABETES FOOT EXAM

## 2015-11-21 MED ORDER — GABAPENTIN 100 MG PO CAPS: ORAL_CAPSULE | ORAL | 5 refills | Status: DC

## 2015-11-21 MED ORDER — TRAMADOL HCL 50 MG PO TABS: 50.0000 mg | ORAL_TABLET | Freq: Four times a day (QID) | ORAL | 0 refills | Status: DC | PRN

## 2015-11-21 MED ORDER — ESOMEPRAZOLE MAGNESIUM 40 MG PO CPDR: DELAYED_RELEASE_CAPSULE | ORAL | 3 refills | Status: DC

## 2015-11-21 MED ORDER — LIDOCAINE 5 % EX PTCH: MEDICATED_PATCH | CUTANEOUS | 0 refills | Status: DC

## 2015-11-21 MED ORDER — IBANDRONATE SODIUM 150 MG PO TABS: 150.0000 mg | ORAL_TABLET | ORAL | 3 refills | Status: DC

## 2015-11-21 MED ORDER — AMLODIPINE BESYLATE 5 MG PO TABS: 5.0000 mg | ORAL_TABLET | Freq: Every day | ORAL | 3 refills | Status: DC

## 2015-11-21 MED ORDER — ATENOLOL 100 MG PO TABS: ORAL_TABLET | ORAL | 3 refills | Status: DC

## 2015-11-21 MED ORDER — FLUOXETINE HCL 20 MG PO CAPS: 20.0000 mg | ORAL_CAPSULE | Freq: Every day | ORAL | 3 refills | Status: DC

## 2015-11-21 MED ORDER — LOSARTAN POTASSIUM-HCTZ 100-25 MG PO TABS: 1.0000 | ORAL_TABLET | Freq: Every day | ORAL | 3 refills | Status: DC

## 2015-11-21 NOTE — Progress Notes (Signed)
Earlier on 10/19/2017she saw Candis Musa, LPN for medicare wellness. Note reviewed in detail . Summary: Health maintenance: Flu vaccine - administered PPSV23 - administered Eye exam - pt plans to schedule future appt A1C - completed Shingles - pt declined due to cost  Abnormal screenings:  Hearing - failed Mini-Cog score: 19/20 Fall risk - hx of multiple falls without injury  Patient concerns:   Pt voiced concerns about neuropathy in both feet. Pt states neuropathy attributes to falls and is a barrier to walking for exercise. Pt reports a hard "pebble-like" area to bottom of right foot. Pt has been encouraged to share these concerns with PCP and to discuss a podiatry referral, if necessary.  Nurse concerns: None  Diabetes: Well controlled on no medication.  Lab Results  Component Value Date   HGBA1C 5.9 11/09/2015  Using medications without difficulties:None  Hypoglycemic episodes:None  Hyperglycemic episodes:NONE  Feet problems: DM neuropathy: improved on gabapentin, takes one in AM and 3 tabs at night. Moderate control. Blood Sugars averaging: Has not checking in a while Eye exam within last year:  due   Hypertension:At goal on amlodipine, HCTZ, atenolol.  BP Readings from Last 3 Encounters:  11/21/15 139/72  11/09/15 124/70  06/06/15 140/70  Using medication without problems or lightheadedness: None  Chest pain with exertion:None  Edema: mild off and on Short of breath:Yes, with extreme exertion Average home BPs: not checking  Other issues:   Elevated Cholesterol: Almost at LDL at goal <100, triglycerides above goal. On no med.  Lab Results  Component Value Date   CHOL 194 11/09/2015   HDL 56.00 11/09/2015   LDLCALC 107 (H) 11/09/2015   LDLDIRECT 102.5 07/21/2009   TRIG 156.0 (H) 11/09/2015   CHOLHDL 3 11/09/2015  Diet compliance: Good  Exercise: No dedicated exercise, back at work moving more  Other complaints:   Does history  of gastric ulcer years ago.  On nexium daily.   Peripheral neuropathy: Will controlled gabapentin 100 mg in AM, 300 mg at night. Improved with better shoes in last few weeks.  Wt Readings from Last 3 Encounters:  11/21/15 158 lb (71.7 kg)  11/09/15 156 lb (70.8 kg)  06/06/15 155 lb 8 oz (70.5 kg)   Vit d now nml: She has been taking ca and vit D.  Social History /Family History/Past Medical History reviewed and updated if needed.  Review of Systems  Constitutional: Negative for fever, fatigue and unexpected weight change.  HENT: Negative for ear pain, congestion, sore throat, sneezing, trouble swallowing and sinus pressure.  Eyes: Negative for pain and itching.  Respiratory: Negative for cough, shortness of breath and wheezing.  Cardiovascular: Negative for chest pain, palpitations and leg swelling.  Gastrointestinal: Negative for nausea, abdominal pain, diarrhea, constipation and blood in stool.  Genitourinary: Negative for dysuria, hematuria, vaginal discharge, difficulty urinating and menstrual problem.  Skin: Negative for rash.  Neurological: Negative for syncope, weakness, light-headedness, numbness and headaches.  Psychiatric/Behavioral: Negative for confusion and dysphoric mood. The patient is not nervous/anxious.  Objective:   Physical Exam  Constitutional: Vital signs are normal. She appears well-developed and well-nourished. She is cooperative. Non-toxic appearance. She does not appear ill. No distress.  HENT:  Head: Normocephalic.  Right Ear: Hearing, tympanic membrane, external ear and ear canal normal.  Left Ear: Hearing, tympanic membrane, external ear and ear canal normal.  Nose: Nose normal.  Eyes: Conjunctivae, EOM and lids are normal. Pupils are equal, round, and reactive to light. No foreign bodies found.  Neck: Trachea normal and normal range of motion. Neck supple. Carotid bruit is not present. No mass and no thyromegaly present.   Cardiovascular: Normal rate, regular rhythm, S1 normal, S2 normal, normal heart sounds and intact distal pulses. Exam reveals no gallop. 2/6 sys murmur greatest at RUSB Pulmonary/Chest: Effort normal and breath sounds normal. No respiratory distress. She has no wheezes. She has no rhonchi. She has no rales.  Abdominal: Soft. Normal appearance and bowel sounds are normal. She exhibits no distension, no fluid wave, no abdominal bruit and no mass. There is no hepatosplenomegaly. There is no tenderness. There is no rebound, no guarding and no CVA tenderness. No hernia. Hernia confirmed negative in the right inguinal area and confirmed negative in the left inguinal area.  Genitourinary: . No breast swelling, tenderness, discharge or bleeding. Lymphadenopathy:  She has no cervical adenopathy.  She has no axillary adenopathy.  Right: No inguinal adenopathy present.  Left: No inguinal adenopathy present.  Neurological: She is alert. She has normal strength. No cranial nerve deficit or sensory deficit.  Skin: Skin is warm, dry and intact. No rash noted.  Psychiatric: Her speech is normal and behavior is normal. Judgment normal. Her mood appears not anxious. Cognition and memory are normal. She does not exhibit a depressed mood.   Diabetic foot exam: Normal inspection No skin breakdown No calluses  Normal DP pulses Decreaed sensation to light touch and monofilament Nails missing on great toes bilaterally.l  Assessment & Plan:   The patient's preventative maintenance and recommended screening tests for an annual wellness exam were reviewed in full today.  Brought up to date unless services declined.   Vaccines:as above DEXA: osteoporosis 04/2012, off fosamax, stopped for tounge soreness.  Has stomach ulcer 11/2103 on PPI nexium. 11/2014:  osteoprosis Will try Boniva as of 10/2015.  Plan repeat  Colon: 08/2013, Dr. Radford Pax, repeat  5 year. Mammo: nml 06/2014, sister breast cancer age  53. Plans yearly. PAP/DVE: partial hysterectomy, no pap smear or DVE required,. No family history of ovarian cancer.  Hep C: neg

## 2015-11-21 NOTE — Assessment & Plan Note (Addendum)
Almost at goal LDL on no med.

## 2015-11-21 NOTE — Assessment & Plan Note (Signed)
Likely due to longterm diabetes although well controlled.  Continue gabapentin.

## 2015-11-21 NOTE — Progress Notes (Signed)
Pre visit review using our clinic review tool, if applicable. No additional management support is needed unless otherwise documented below in the visit note. 

## 2015-11-21 NOTE — Assessment & Plan Note (Addendum)
Well controlled on no med. Encouraged exercise, weight loss, healthy eating habits. Counseled on following feet daily, avoiding injury.

## 2015-11-21 NOTE — Patient Instructions (Addendum)
Start regular exercise to strengthen  Knee and work on weight loss. Low cholesterol and low carb diet.  Stop at front for referral  to audiologist/ [facility] [place] [facility].  Due for eye exam.  Plan mammogram. Start bonvia

## 2015-11-21 NOTE — Assessment & Plan Note (Signed)
Well controlled. Continue current medication.  

## 2015-11-21 NOTE — Assessment & Plan Note (Signed)
Stable on iron.

## 2015-11-24 MED ORDER — IBANDRONATE SODIUM 150 MG PO TABS: 150.0000 mg | ORAL_TABLET | ORAL | 3 refills | Status: DC

## 2015-11-24 NOTE — Addendum Note (Signed)
Addended by: Carter Kitten on: 11/24/2015 12:31 PM   Modules accepted: Orders

## 2015-12-04 DIAGNOSIS — H905 Unspecified sensorineural hearing loss: Secondary | ICD-10-CM | POA: Diagnosis not present

## 2015-12-25 ENCOUNTER — Telehealth: Payer: Self-pay | Admitting: *Deleted

## 2015-12-25 NOTE — Telephone Encounter (Addendum)
Received fax form [employer] requesting PA for Lidocaine Patch.  PA completed on CoverMyMeds.  Outcome:  Favorable.  Authorization good until 06/23/2016.

## 2016-01-05 ENCOUNTER — Other Ambulatory Visit: Payer: Self-pay | Admitting: Family Medicine

## 2016-01-24 DIAGNOSIS — H401132 Primary open-angle glaucoma, bilateral, moderate stage: Secondary | ICD-10-CM | POA: Diagnosis not present

## 2016-02-02 DIAGNOSIS — H2513 Age-related nuclear cataract, bilateral: Secondary | ICD-10-CM | POA: Diagnosis not present

## 2016-02-02 LAB — HM DIABETES EYE EXAM

## 2016-02-06 ENCOUNTER — Encounter: Payer: Self-pay | Admitting: Family Medicine

## 2016-02-12 DIAGNOSIS — H2513 Age-related nuclear cataract, bilateral: Secondary | ICD-10-CM | POA: Diagnosis not present

## 2016-02-16 ENCOUNTER — Encounter: Payer: Self-pay | Admitting: *Deleted

## 2016-02-16 NOTE — Discharge Instructions (Signed)
Cataract Surgery, Care After °Refer to this sheet in the next few weeks. These instructions provide you with information about caring for yourself after your procedure. Your health care provider may also give you more specific instructions. Your treatment has been planned according to current medical practices, but problems sometimes occur. Call your health care provider if you have any problems or questions after your procedure. °What can I expect after the procedure? °After the procedure, it is common to have: °· Itching. °· Discomfort. °· Fluid discharge. °· Sensitivity to light and to touch. °· Bruising. °Follow these instructions at home: °Eye Care  °· Check your eye every day for signs of infection. Watch for: °¨ Redness, swelling, or pain. °¨ Fluid, blood, or pus. °¨ Warmth. °¨ Bad smell. °Activity  °· Avoid strenuous activities, such as playing contact sports, for as long as told by your health care provider. °· Do not drive or operate heavy machinery until your health care provider approves. °· Do not bend or lift heavy objects . Bending increases pressure in the eye. You can walk, climb stairs, and do light household chores. °· Ask your health care provider when you can return to work. If you work in a dusty environment, you may be advised to wear protective eyewear for a period of time. °General instructions  °· Take or apply over-the-counter and prescription medicines only as told by your health care provider. This includes eye drops. °· Do not touch or rub your eyes. °· If you were given a protective shield, wear it as told by your health care provider. If you were not given a protective shield, wear sunglasses as told by your health care provider to protect your eyes. °· Keep the area around your eye clean and dry. Avoid swimming or allowing water to hit you directly in the face while showering until told by your health care provider. Keep soap and shampoo out of your eyes. °· Do not put a contact lens  into the affected eye or eyes until your health care provider approves. °· Keep all follow-up visits as told by your health care provider. This is important. °Contact a health care provider if: ° °· You have increased bruising around your eye. °· You have pain that is not helped with medicine. °· You have a fever. °· You have redness, swelling, or pain in your eye. °· You have fluid, blood, or pus coming from your incision. °· Your vision gets worse. °Get help right away if: °· You have sudden vision loss. °This information is not intended to replace advice given to you by your health care provider. Make sure you discuss any questions you have with your health care provider. °Document Released: 07/27/2004 Document Revised: 05/18/2015 Document Reviewed: 11/17/2014 °Elsevier Interactive Patient Education © 2017 [employer] ° ° ° ° °General Anesthesia, Adult, Care After °These instructions provide you with information about caring for yourself after your procedure. Your health care provider may also give you more specific instructions. Your treatment has been planned according to current medical practices, but problems sometimes occur. Call your health care provider if you have any problems or questions after your procedure. °What can I expect after the procedure? °After the procedure, it is common to have: °· Vomiting. °· A sore throat. °· Mental slowness. °It is common to feel: °· Nauseous. °· Cold or shivery. °· Sleepy. °· Tired. °· Sore or achy, even in parts of your body where you did not have surgery. °Follow these instructions at   home: °For at least 24 hours after the procedure:  °· Do not: °¨ Participate in activities where you could fall or become injured. °¨ Drive. °¨ Use heavy machinery. °¨ Drink alcohol. °¨ Take sleeping pills or medicines that cause drowsiness. °¨ Make important decisions or sign legal documents. °¨ Take care of children on your own. °· Rest. °Eating and drinking  °· If you vomit, drink  water, juice, or soup when you can drink without vomiting. °· Drink enough fluid to keep your urine clear or pale yellow. °· Make sure you have little or no nausea before eating solid foods. °· Follow the diet recommended by your health care provider. °General instructions  °· Have a responsible adult stay with you until you are awake and alert. °· Return to your normal activities as told by your health care provider. Ask your health care provider what activities are safe for you. °· Take over-the-counter and prescription medicines only as told by your health care provider. °· If you smoke, do not smoke without supervision. °· Keep all follow-up visits as told by your health care provider. This is important. °Contact a health care provider if: °· You continue to have nausea or vomiting at home, and medicines are not helpful. °· You cannot drink fluids or start eating again. °· You cannot urinate after 8-12 hours. °· You develop a skin rash. °· You have fever. °· You have increasing redness at the site of your procedure. °Get help right away if: °· You have difficulty breathing. °· You have chest pain. °· You have unexpected bleeding. °· You feel that you are having a life-threatening or urgent problem. °This information is not intended to replace advice given to you by your health care provider. Make sure you discuss any questions you have with your health care provider. °Document Released: 04/15/2000 Document Revised: 06/12/2015 Document Reviewed: 12/22/2014 °Elsevier Interactive Patient Education © 2017 Elsevier Inc[employer] ° °

## 2016-02-21 ENCOUNTER — Ambulatory Visit
Admission: RE | Admit: 2016-02-21 | Discharge: 2016-02-21 | Disposition: A | Discharge status: Home / Self Care | Payer: Medicare HMO | Source: Ambulatory Visit | Attending: Ophthalmology | Admitting: Ophthalmology

## 2016-02-21 ENCOUNTER — Encounter
Admission: RE | Disposition: A | Discharge status: Home / Self Care | Payer: Self-pay | Source: Ambulatory Visit | Attending: Ophthalmology

## 2016-02-21 ENCOUNTER — Encounter: Payer: Self-pay | Admitting: Ophthalmology

## 2016-02-21 ENCOUNTER — Ambulatory Visit: Payer: Medicare HMO | Admitting: Anesthesiology

## 2016-02-21 DIAGNOSIS — H2512 Age-related nuclear cataract, left eye: Secondary | ICD-10-CM | POA: Diagnosis not present

## 2016-02-21 DIAGNOSIS — K449 Diaphragmatic hernia without obstruction or gangrene: Secondary | ICD-10-CM | POA: Diagnosis not present

## 2016-02-21 DIAGNOSIS — K219 Gastro-esophageal reflux disease without esophagitis: Secondary | ICD-10-CM | POA: Insufficient documentation

## 2016-02-21 DIAGNOSIS — R51 Headache: Secondary | ICD-10-CM | POA: Diagnosis not present

## 2016-02-21 DIAGNOSIS — E1136 Type 2 diabetes mellitus with diabetic cataract: Secondary | ICD-10-CM | POA: Diagnosis not present

## 2016-02-21 DIAGNOSIS — H2513 Age-related nuclear cataract, bilateral: Secondary | ICD-10-CM | POA: Diagnosis not present

## 2016-02-21 HISTORY — DX: Polyneuropathy, unspecified: G62.9

## 2016-02-21 HISTORY — DX: Anemia, unspecified: D64.9

## 2016-02-21 HISTORY — DX: Prediabetes: R73.03

## 2016-02-21 HISTORY — DX: Other complications of anesthesia, initial encounter: T88.59XA

## 2016-02-21 HISTORY — DX: Adverse effect of unspecified anesthetic, initial encounter: T41.45XA

## 2016-02-21 HISTORY — DX: Presence of dental prosthetic device (complete) (partial): Z97.2

## 2016-02-21 HISTORY — PX: CATARACT EXTRACTION W/PHACO: SHX586

## 2016-02-21 HISTORY — DX: Age-related osteoporosis without current pathological fracture: M81.0

## 2016-02-21 HISTORY — DX: Cardiac murmur, unspecified: R01.1

## 2016-02-21 LAB — GLUCOSE, CAPILLARY: GLUCOSE-CAPILLARY: 105 mg/dL — AB (ref 65–99)

## 2016-02-21 SURGERY — PHACOEMULSIFICATION, CATARACT, WITH IOL INSERTION
Anesthesia: Monitor Anesthesia Care | Site: Eye | Laterality: Left | Wound class: Clean

## 2016-02-21 MED ORDER — FENTANYL CITRATE (PF) 100 MCG/2ML IJ SOLN
INTRAMUSCULAR | Status: DC | PRN
  Administered 2016-02-21: 50 ug via INTRAVENOUS

## 2016-02-21 MED ORDER — EPINEPHRINE PF 1 MG/ML IJ SOLN
INTRAOCULAR | Status: DC | PRN
  Administered 2016-02-21: 70 mL via OPHTHALMIC

## 2016-02-21 MED ORDER — BRIMONIDINE TARTRATE-TIMOLOL 0.2-0.5 % OP SOLN
OPHTHALMIC | Status: DC | PRN
  Administered 2016-02-21: 1 [drp] via OPHTHALMIC

## 2016-02-21 MED ORDER — LIDOCAINE HCL (PF) 4 % IJ SOLN
INTRAOCULAR | Status: DC | PRN
  Administered 2016-02-21: 1 mL via OPHTHALMIC

## 2016-02-21 MED ORDER — NA HYALUR & NA CHOND-NA HYALUR 0.4-0.35 ML IO KIT
PACK | INTRAOCULAR | Status: DC | PRN
  Administered 2016-02-21: 1 mL via INTRAOCULAR

## 2016-02-21 MED ORDER — CEFUROXIME OPHTHALMIC INJECTION 1 MG/0.1 ML
INJECTION | OPHTHALMIC | Status: DC | PRN
  Administered 2016-02-21: 0.1 mL via INTRACAMERAL

## 2016-02-21 MED ORDER — MIDAZOLAM HCL 2 MG/2ML IJ SOLN
INTRAMUSCULAR | Status: DC | PRN
  Administered 2016-02-21: 2 mg via INTRAVENOUS

## 2016-02-21 MED ORDER — MOXIFLOXACIN HCL 0.5 % OP SOLN
1.0000 [drp] | OPHTHALMIC | Status: DC | PRN
  Administered 2016-02-21 (×3): 1 [drp] via OPHTHALMIC

## 2016-02-21 MED ORDER — ARMC OPHTHALMIC DILATING DROPS
1.0000 "application " | OPHTHALMIC | Status: DC | PRN
  Administered 2016-02-21 (×3): 1 via OPHTHALMIC

## 2016-02-21 SURGICAL SUPPLY — 25 items
CANNULA ANT/CHMB 27GA (MISCELLANEOUS) ×3 IMPLANT
CARTRIDGE ABBOTT (MISCELLANEOUS) IMPLANT
GLOVE SURG LX 7.5 STRW (GLOVE) ×2
GLOVE SURG LX STRL 7.5 STRW (GLOVE) ×1 IMPLANT
GLOVE SURG TRIUMPH 8.0 PF LTX (GLOVE) ×3 IMPLANT
GOWN STRL REUS W/ TWL LRG LVL3 (GOWN DISPOSABLE) ×2 IMPLANT
GOWN STRL REUS W/TWL LRG LVL3 (GOWN DISPOSABLE) ×4
LENS IOL TECNIS ITEC 17.5 (Intraocular Lens) ×3 IMPLANT
MARKER SKIN DUAL TIP RULER LAB (MISCELLANEOUS) ×3 IMPLANT
NDL RETROBULBAR .5 NSTRL (NEEDLE) IMPLANT
NEEDLE FILTER BLUNT 18X 1/2SAF (NEEDLE) ×2
NEEDLE FILTER BLUNT 18X1 1/2 (NEEDLE) ×1 IMPLANT
PACK CATARACT BRASINGTON (MISCELLANEOUS) ×3 IMPLANT
PACK EYE AFTER SURG (MISCELLANEOUS) ×3 IMPLANT
PACK OPTHALMIC (MISCELLANEOUS) ×3 IMPLANT
RING MALYGIN 7.0 (MISCELLANEOUS) IMPLANT
SUT ETHILON 10-0 CS-B-6CS-B-6 (SUTURE)
SUT VICRYL  9 0 (SUTURE)
SUT VICRYL 9 0 (SUTURE) IMPLANT
SUTURE EHLN 10-0 CS-B-6CS-B-6 (SUTURE) IMPLANT
SYR 3ML LL SCALE MARK (SYRINGE) ×3 IMPLANT
SYR 5ML LL (SYRINGE) ×3 IMPLANT
SYR TB 1ML LUER SLIP (SYRINGE) ×3 IMPLANT
WATER STERILE IRR 250ML POUR (IV SOLUTION) ×3 IMPLANT
WIPE NON LINTING 3.25X3.25 (MISCELLANEOUS) ×3 IMPLANT

## 2016-02-21 NOTE — Op Note (Signed)
OPERATIVE NOTE  Ashley Ryan [id number] 02/21/2016   PREOPERATIVE DIAGNOSIS:  Nuclear sclerotic cataract left eye. H25.12   POSTOPERATIVE DIAGNOSIS:    Nuclear sclerotic cataract left eye.     PROCEDURE:  Phacoemusification with posterior chamber intraocular lens placement of the left eye   LENS:   Implant Name Type Inv. Item Serial No. Manufacturer Lot No. LRB No. Used  LENS IOL DIOP 17.5 - [id number] Intraocular Lens LENS IOL DIOP 17.5 [id number]   Left 1        ULTRASOUND TIME: 19  % of 0 minutes 5 seconds, CDE 410.2  SURGEON:  Wyonia Hough, MD   ANESTHESIA:  Topical with tetracaine drops and 2% Xylocaine jelly, augmented with 1% preservative-free intracameral lidocaine.    COMPLICATIONS:  None.   DESCRIPTION OF PROCEDURE:  The patient was identified in the holding room and transported to the operating room and placed in the supine position under the operating microscope.  The left eye was identified as the operative eye and it was prepped and draped in the usual sterile ophthalmic fashion.   A 1 millimeter clear-corneal paracentesis was made at the 1:30 position.  0.5 ml of preservative-free 1% lidocaine was injected into the anterior chamber.  The anterior chamber was filled with Viscoat viscoelastic.  A 2.4 millimeter keratome was used to make a near-clear corneal incision at the 10:30 position.  .  A curvilinear capsulorrhexis was made with a cystotome and capsulorrhexis forceps.  Balanced salt solution was used to hydrodissect and hydrodelineate the nucleus.   Phacoemulsification was then used in stop and chop fashion to remove the lens nucleus and epinucleus.  The remaining cortex was then removed using the irrigation and aspiration handpiece. Provisc was then placed into the capsular bag to distend it for lens placement.  A lens was then injected into the capsular bag.  The remaining viscoelastic was aspirated.   Wounds were hydrated with balanced salt  solution.  The anterior chamber was inflated to a physiologic pressure with balanced salt solution.  No wound leaks were noted. Cefuroxime 0.1 ml of a 10mg /ml solution was injected into the anterior chamber for a dose of 1 mg of intracameral antibiotic at the completion of the case.   Timolol and Brimonidine drops were applied to the eye.  The patient was taken to the recovery room in stable condition without complications of anesthesia or surgery.  Ashley Ryan 02/21/2016, 9:03 AM

## 2016-02-21 NOTE — Transfer of Care (Signed)
Immediate Anesthesia Transfer of Care Note  Patient: Ashley Ryan  Procedure(s) Performed: Procedure(s) with comments: CATARACT EXTRACTION PHACO AND INTRAOCULAR LENS PLACEMENT (IOC)  Left eye (Left) - Left eye Diabetic  Patient Location: PACU  Anesthesia Type: MAC  Level of Consciousness: awake, alert  and patient cooperative  Airway and Oxygen Therapy: Patient Spontanous Breathing and Patient connected to supplemental oxygen  Post-op Assessment: Post-op Vital signs reviewed, Patient's Cardiovascular Status Stable, Respiratory Function Stable, Patent Airway and No signs of Nausea or vomiting  Post-op Vital Signs: Reviewed and stable  Complications: No apparent anesthesia complications

## 2016-02-21 NOTE — Anesthesia Procedure Notes (Signed)
Procedure Name: MAC Performed by: Camyla Camposano Pre-anesthesia Checklist: Patient identified, Emergency Drugs available, Suction available, Timeout performed and Patient being monitored Patient Re-evaluated:Patient Re-evaluated prior to inductionOxygen Delivery Method: Nasal cannula Placement Confirmation: positive ETCO2     

## 2016-02-21 NOTE — H&P (Signed)
The History and Physical notes are on paper, have been signed, and are to be scanned. The patient remains stable and unchanged from the H&P.   Previous H&P reviewed, patient examined, and there are no changes.  Ashley Ryan 02/21/2016 8:17 AM

## 2016-02-21 NOTE — Anesthesia Postprocedure Evaluation (Signed)
Anesthesia Post Note  Patient: Ashley Ryan  Procedure(s) Performed: Procedure(s) (LRB): CATARACT EXTRACTION PHACO AND INTRAOCULAR LENS PLACEMENT (IOC)  Left eye (Left)  Patient location during evaluation: PACU Anesthesia Type: MAC Level of consciousness: awake and alert Pain management: pain level controlled Vital Signs Assessment: post-procedure vital signs reviewed and stable Respiratory status: spontaneous breathing, nonlabored ventilation, respiratory function stable and patient connected to nasal cannula oxygen Cardiovascular status: stable and blood pressure returned to baseline Anesthetic complications: no    Alisa Graff

## 2016-02-21 NOTE — Anesthesia Preprocedure Evaluation (Signed)
Anesthesia Evaluation  Patient identified by MRN, date of birth, ID band Patient awake    Reviewed: Allergy & Precautions, H&P , NPO status , Patient's Chart, lab work & pertinent test results, reviewed documented beta blocker date and time   Airway Mallampati: II  TM Distance: >3 FB Neck ROM: full    Dental no notable dental hx.    Pulmonary neg pulmonary ROS,    Pulmonary exam normal breath sounds clear to auscultation       Cardiovascular Exercise Tolerance: Good hypertension,  Rhythm:regular Rate:Normal     Neuro/Psych  Headaches, negative psych ROS   GI/Hepatic Neg liver ROS, hiatal hernia, PUD, GERD  ,  Endo/Other  diabetes  Renal/GU negative Renal ROS  negative genitourinary   Musculoskeletal   Abdominal   Peds  Hematology negative hematology ROS (+)   Anesthesia Other Findings   Reproductive/Obstetrics negative OB ROS                             Anesthesia Physical Anesthesia Plan  ASA: II  Anesthesia Plan: MAC   Post-op Pain Management:    Induction:   Airway Management Planned:   Additional Equipment:   Intra-op Plan:   Post-operative Plan:   Informed Consent: I have reviewed the patients History and Physical, chart, labs and discussed the procedure including the risks, benefits and alternatives for the proposed anesthesia with the patient or authorized representative who has indicated his/her understanding and acceptance.   Dental Advisory Given  Plan Discussed with: CRNA  Anesthesia Plan Comments:         Anesthesia Quick Evaluation

## 2016-02-21 NOTE — Anesthesia Procedure Notes (Deleted)
Performed by: Filippa Yarbough Pre-anesthesia Checklist: Patient identified, Emergency Drugs available, Suction available, Timeout performed and Patient being monitored Patient Re-evaluated:Patient Re-evaluated prior to inductionOxygen Delivery Method: Circle system utilized Preoxygenation: Pre-oxygenation with 100% oxygen Intubation Type: Inhalational induction Ventilation: Mask ventilation without difficulty and Mask ventilation throughout procedure Dental Injury: Teeth and Oropharynx as per pre-operative assessment        

## 2016-03-06 ENCOUNTER — Encounter: Payer: Self-pay | Admitting: *Deleted

## 2016-03-06 NOTE — Discharge Instructions (Signed)
General Anesthesia, Adult, Care After °These instructions provide you with information about caring for yourself after your procedure. Your health care provider may also give you more specific instructions. Your treatment has been planned according to current medical practices, but problems sometimes occur. Call your health care provider if you have any problems or questions after your procedure. °What can I expect after the procedure? °After the procedure, it is common to have: °· Vomiting. °· A sore throat. °· Mental slowness. °It is common to feel: °· Nauseous. °· Cold or shivery. °· Sleepy. °· Tired. °· Sore or achy, even in parts of your body where you did not have surgery. °Follow these instructions at home: °For at least 24 hours after the procedure: °· Do not: °¨ Participate in activities where you could fall or become injured. °¨ Drive. °¨ Use heavy machinery. °¨ Drink alcohol. °¨ Take sleeping pills or medicines that cause drowsiness. °¨ Make important decisions or sign legal documents. °¨ Take care of children on your own. °· Rest. °Eating and drinking °· If you vomit, drink water, juice, or soup when you can drink without vomiting. °· Drink enough fluid to keep your urine clear or pale yellow. °· Make sure you have little or no nausea before eating solid foods. °· Follow the diet recommended by your health care provider. °General instructions °· Have a responsible adult stay with you until you are awake and alert. °· Return to your normal activities as told by your health care provider. Ask your health care provider what activities are safe for you. °· Take over-the-counter and prescription medicines only as told by your health care provider. °· If you smoke, do not smoke without supervision. °· Keep all follow-up visits as told by your health care provider. This is important. °Contact a health care provider if: °· You continue to have nausea or vomiting at home, and medicines are not helpful. °· You  cannot drink fluids or start eating again. °· You cannot urinate after 8-12 hours. °· You develop a skin rash. °· You have fever. °· You have increasing redness at the site of your procedure. °Get help right away if: °· You have difficulty breathing. °· You have chest pain. °· You have unexpected bleeding. °· You feel that you are having a life-threatening or urgent problem. °This information is not intended to replace advice given to you by your health care provider. Make sure you discuss any questions you have with your health care provider. °Document Released: 04/15/2000 Document Revised: 06/12/2015 Document Reviewed: 12/22/2014 °Elsevier Interactive Patient Education © 2017 [employer] ° ° °Cataract Surgery, Care After °Refer to this sheet in the next few weeks. These instructions provide you with information about caring for yourself after your procedure. Your health care provider may also give you more specific instructions. Your treatment has been planned according to current medical practices, but problems sometimes occur. Call your health care provider if you have any problems or questions after your procedure. °What can I expect after the procedure? °After the procedure, it is common to have: °· Itching. °· Discomfort. °· Fluid discharge. °· Sensitivity to light and to touch. °· Bruising. °Follow these instructions at home: °Eye Care °· Check your eye every day for signs of infection. Watch for: °¨ Redness, swelling, or pain. °¨ Fluid, blood, or pus. °¨ Warmth. °¨ Bad smell. °Activity °· Avoid strenuous activities, such as playing contact sports, for as long as told by your health care provider. °· Do not   drive or operate heavy machinery until your health care provider approves. °· Do not bend or lift heavy objects . Bending increases pressure in the eye. You can walk, climb stairs, and do light household chores. °· Ask your health care provider when you can return to work. If you work in a dusty  environment, you may be advised to wear protective eyewear for a period of time. °General instructions °· Take or apply over-the-counter and prescription medicines only as told by your health care provider. This includes eye drops. °· Do not touch or rub your eyes. °· If you were given a protective shield, wear it as told by your health care provider. If you were not given a protective shield, wear sunglasses as told by your health care provider to protect your eyes. °· Keep the area around your eye clean and dry. Avoid swimming or allowing water to hit you directly in the face while showering until told by your health care provider. Keep soap and shampoo out of your eyes. °· Do not put a contact lens into the affected eye or eyes until your health care provider approves. °· Keep all follow-up visits as told by your health care provider. This is important. °Contact a health care provider if: ° °· You have increased bruising around your eye. °· You have pain that is not helped with medicine. °· You have a fever. °· You have redness, swelling, or pain in your eye. °· You have fluid, blood, or pus coming from your incision. °· Your vision gets worse. °Get help right away if: °· You have sudden vision loss. °This information is not intended to replace advice given to you by your health care provider. Make sure you discuss any questions you have with your health care provider. °Document Released: 07/27/2004 Document Revised: 05/18/2015 Document Reviewed: 11/17/2014 °Elsevier Interactive Patient Education © 2017 Elsevier [employer]. ° °

## 2016-03-07 DIAGNOSIS — H2511 Age-related nuclear cataract, right eye: Secondary | ICD-10-CM | POA: Diagnosis not present

## 2016-03-13 ENCOUNTER — Ambulatory Visit: Payer: Medicare HMO | Admitting: Anesthesiology

## 2016-03-13 ENCOUNTER — Encounter
Admission: RE | Disposition: A | Discharge status: Home / Self Care | Payer: Self-pay | Source: Ambulatory Visit | Attending: Ophthalmology

## 2016-03-13 ENCOUNTER — Ambulatory Visit
Admission: RE | Admit: 2016-03-13 | Discharge: 2016-03-13 | Disposition: A | Discharge status: Home / Self Care | Payer: Medicare HMO | Source: Ambulatory Visit | Attending: Ophthalmology | Admitting: Ophthalmology

## 2016-03-13 DIAGNOSIS — H2511 Age-related nuclear cataract, right eye: Secondary | ICD-10-CM | POA: Diagnosis not present

## 2016-03-13 DIAGNOSIS — K449 Diaphragmatic hernia without obstruction or gangrene: Secondary | ICD-10-CM | POA: Insufficient documentation

## 2016-03-13 DIAGNOSIS — Z79899 Other long term (current) drug therapy: Secondary | ICD-10-CM | POA: Diagnosis not present

## 2016-03-13 DIAGNOSIS — I10 Essential (primary) hypertension: Secondary | ICD-10-CM | POA: Diagnosis not present

## 2016-03-13 DIAGNOSIS — K219 Gastro-esophageal reflux disease without esophagitis: Secondary | ICD-10-CM | POA: Diagnosis not present

## 2016-03-13 DIAGNOSIS — E1136 Type 2 diabetes mellitus with diabetic cataract: Secondary | ICD-10-CM | POA: Diagnosis not present

## 2016-03-13 HISTORY — PX: CATARACT EXTRACTION W/PHACO: SHX586

## 2016-03-13 LAB — GLUCOSE, CAPILLARY: Glucose-Capillary: 96 mg/dL (ref 65–99)

## 2016-03-13 SURGERY — PHACOEMULSIFICATION, CATARACT, WITH IOL INSERTION
Anesthesia: Monitor Anesthesia Care | Site: Eye | Laterality: Right | Wound class: Clean

## 2016-03-13 MED ORDER — ARMC OPHTHALMIC DILATING DROPS
1.0000 "application " | OPHTHALMIC | Status: DC | PRN
  Administered 2016-03-13 (×3): 1 via OPHTHALMIC

## 2016-03-13 MED ORDER — FENTANYL CITRATE (PF) 100 MCG/2ML IJ SOLN
INTRAMUSCULAR | Status: DC | PRN
  Administered 2016-03-13: 100 ug via INTRAVENOUS

## 2016-03-13 MED ORDER — EPINEPHRINE PF 1 MG/ML IJ SOLN
INTRAOCULAR | Status: DC | PRN
  Administered 2016-03-13: 52 mL via OPHTHALMIC

## 2016-03-13 MED ORDER — MIDAZOLAM HCL 2 MG/2ML IJ SOLN
INTRAMUSCULAR | Status: DC | PRN
  Administered 2016-03-13: 2 mg via INTRAVENOUS

## 2016-03-13 MED ORDER — MOXIFLOXACIN HCL 0.5 % OP SOLN
1.0000 [drp] | OPHTHALMIC | Status: DC | PRN
  Administered 2016-03-13 (×3): 1 [drp] via OPHTHALMIC

## 2016-03-13 MED ORDER — NA HYALUR & NA CHOND-NA HYALUR 0.4-0.35 ML IO KIT
PACK | INTRAOCULAR | Status: DC | PRN
  Administered 2016-03-13: 1 mL via INTRAOCULAR

## 2016-03-13 MED ORDER — LIDOCAINE HCL (PF) 2 % IJ SOLN
INTRAOCULAR | Status: DC | PRN
  Administered 2016-03-13: 1 mL via INTRAOCULAR

## 2016-03-13 MED ORDER — BRIMONIDINE TARTRATE-TIMOLOL 0.2-0.5 % OP SOLN
OPHTHALMIC | Status: DC | PRN
  Administered 2016-03-13: 1 [drp] via OPHTHALMIC

## 2016-03-13 MED ORDER — CEFUROXIME OPHTHALMIC INJECTION 1 MG/0.1 ML
INJECTION | OPHTHALMIC | Status: DC | PRN
  Administered 2016-03-13: .3 mL via OPHTHALMIC

## 2016-03-13 SURGICAL SUPPLY — 25 items
CANNULA ANT/CHMB 27GA (MISCELLANEOUS) ×3 IMPLANT
CARTRIDGE ABBOTT (MISCELLANEOUS) IMPLANT
GLOVE SURG LX 7.5 STRW (GLOVE) ×2
GLOVE SURG LX STRL 7.5 STRW (GLOVE) ×1 IMPLANT
GLOVE SURG TRIUMPH 8.0 PF LTX (GLOVE) ×3 IMPLANT
GOWN STRL REUS W/ TWL LRG LVL3 (GOWN DISPOSABLE) ×2 IMPLANT
GOWN STRL REUS W/TWL LRG LVL3 (GOWN DISPOSABLE) ×4
LENS IOL TECNIS ITEC 18.0 (Intraocular Lens) ×3 IMPLANT
MARKER SKIN DUAL TIP RULER LAB (MISCELLANEOUS) ×3 IMPLANT
NDL RETROBULBAR .5 NSTRL (NEEDLE) IMPLANT
NEEDLE FILTER BLUNT 18X 1/2SAF (NEEDLE) ×2
NEEDLE FILTER BLUNT 18X1 1/2 (NEEDLE) ×1 IMPLANT
PACK CATARACT BRASINGTON (MISCELLANEOUS) ×3 IMPLANT
PACK EYE AFTER SURG (MISCELLANEOUS) ×3 IMPLANT
PACK OPTHALMIC (MISCELLANEOUS) ×3 IMPLANT
RING MALYGIN 7.0 (MISCELLANEOUS) IMPLANT
SUT ETHILON 10-0 CS-B-6CS-B-6 (SUTURE)
SUT VICRYL  9 0 (SUTURE)
SUT VICRYL 9 0 (SUTURE) IMPLANT
SUTURE EHLN 10-0 CS-B-6CS-B-6 (SUTURE) IMPLANT
SYR 3ML LL SCALE MARK (SYRINGE) ×3 IMPLANT
SYR 5ML LL (SYRINGE) ×3 IMPLANT
SYR TB 1ML LUER SLIP (SYRINGE) ×3 IMPLANT
WATER STERILE IRR 250ML POUR (IV SOLUTION) ×3 IMPLANT
WIPE NON LINTING 3.25X3.25 (MISCELLANEOUS) ×3 IMPLANT

## 2016-03-13 NOTE — Op Note (Signed)
LOCATION:  [place]   PREOPERATIVE DIAGNOSIS:    Nuclear sclerotic cataract right eye. H25.11   POSTOPERATIVE DIAGNOSIS:  Nuclear sclerotic cataract right eye.     PROCEDURE:  Phacoemusification with posterior chamber intraocular lens placement of the right eye   LENS:   Implant Name Type Inv. Item Serial No. Manufacturer Lot No. LRB No. Used  LENS IOL DIOP 18.0 - [id number] Intraocular Lens LENS IOL DIOP 18.0 [id number]   Right 1        ULTRASOUND TIME: 12 % of 1 minutes, 8 seconds.  CDE 8.8   SURGEON:  Wyonia Hough, MD   ANESTHESIA:  Topical with tetracaine drops and 2% Xylocaine jelly, augmented with 1% preservative-free intracameral lidocaine.    COMPLICATIONS:  None.   DESCRIPTION OF PROCEDURE:  The patient was identified in the holding room and transported to the operating room and placed in the supine position under the operating microscope.  The right eye was identified as the operative eye and it was prepped and draped in the usual sterile ophthalmic fashion.   A 1 millimeter clear-corneal paracentesis was made at the 12:00 position.  0.5 ml of preservative-free 1% lidocaine was injected into the anterior chamber. The anterior chamber was filled with Viscoat viscoelastic.  A 2.4 millimeter keratome was used to make a near-clear corneal incision at the 9:00 position.  A curvilinear capsulorrhexis was made with a cystotome and capsulorrhexis forceps.  Balanced salt solution was used to hydrodissect and hydrodelineate the nucleus.   Phacoemulsification was then used in stop and chop fashion to remove the lens nucleus and epinucleus.  The remaining cortex was then removed using the irrigation and aspiration handpiece. Provisc was then placed into the capsular bag to distend it for lens placement.  A lens was then injected into the capsular bag.  The remaining viscoelastic was aspirated.   Wounds were hydrated with balanced salt solution.  The anterior  chamber was inflated to a physiologic pressure with balanced salt solution.  No wound leaks were noted. Cefuroxime 0.1 ml of a 10mg /ml solution was injected into the anterior chamber for a dose of 1 mg of intracameral antibiotic at the completion of the case.   Timolol and Brimonidine drops were applied to the eye.  The patient was taken to the recovery room in stable condition without complications of anesthesia or surgery.   Ronon Ferger 03/13/2016, 9:46 AM

## 2016-03-13 NOTE — H&P (Signed)
The History and Physical notes are on paper, have been signed, and are to be scanned. The patient remains stable and unchanged from the H&P.   Previous H&P reviewed, patient examined, and there are no changes.  Ashley Ryan 03/13/2016 9:09 AM

## 2016-03-13 NOTE — Anesthesia Procedure Notes (Signed)
Procedure Name: MAC Date/Time: 03/13/2016 9:20 AM Performed by: Janna Arch Pre-anesthesia Checklist: Patient identified, Emergency Drugs available, Suction available and Patient being monitored Patient Re-evaluated:Patient Re-evaluated prior to inductionOxygen Delivery Method: Nasal cannula

## 2016-03-13 NOTE — Anesthesia Preprocedure Evaluation (Signed)
Anesthesia Evaluation  Patient identified by MRN, date of birth, ID band Patient awake    Reviewed: Allergy & Precautions, NPO status , Patient's Chart, lab work & pertinent test results  History of Anesthesia Complications (+) PONV and history of anesthetic complications  Airway Mallampati: I  TM Distance: >3 FB Neck ROM: Full    Dental no notable dental hx.    Pulmonary neg pulmonary ROS,    Pulmonary exam normal        Cardiovascular hypertension, Pt. on medications Normal cardiovascular exam     Neuro/Psych negative neurological ROS  negative psych ROS   GI/Hepatic hiatal hernia, GERD  ,  Endo/Other  diabetes, Well Controlled, Type 2  Renal/GU   negative genitourinary   Musculoskeletal   Abdominal   Peds  Hematology   Anesthesia Other Findings   Reproductive/Obstetrics                             Anesthesia Physical Anesthesia Plan  ASA: II  Anesthesia Plan: MAC   Post-op Pain Management:    Induction:   Airway Management Planned:   Additional Equipment:   Intra-op Plan:   Post-operative Plan:   Informed Consent: I have reviewed the patients History and Physical, chart, labs and discussed the procedure including the risks, benefits and alternatives for the proposed anesthesia with the patient or authorized representative who has indicated his/her understanding and acceptance.     Plan Discussed with:   Anesthesia Plan Comments:         Anesthesia Quick Evaluation

## 2016-03-13 NOTE — Transfer of Care (Signed)
Immediate Anesthesia Transfer of Care Note  Patient: Ashley Ryan  Procedure(s) Performed: Procedure(s) with comments: CATARACT EXTRACTION PHACO AND INTRAOCULAR LENS PLACEMENT (IOC)  right  diabetic (Right) - diabetic  Patient Location: PACU  Anesthesia Type: MAC  Level of Consciousness: awake, alert  and patient cooperative  Airway and Oxygen Therapy: Patient Spontanous Breathing and Patient connected to supplemental oxygen  Post-op Assessment: Post-op Vital signs reviewed, Patient's Cardiovascular Status Stable, Respiratory Function Stable, Patent Airway and No signs of Nausea or vomiting  Post-op Vital Signs: Reviewed and stable  Complications: No apparent anesthesia complications

## 2016-03-13 NOTE — Anesthesia Postprocedure Evaluation (Signed)
Anesthesia Post Note  Patient: Ashley Ryan  Procedure(s) Performed: Procedure(s) (LRB): CATARACT EXTRACTION PHACO AND INTRAOCULAR LENS PLACEMENT (IOC)  right  diabetic (Right)  Patient location during evaluation: PACU Anesthesia Type: MAC Level of consciousness: awake Pain management: pain level controlled Vital Signs Assessment: post-procedure vital signs reviewed and stable Respiratory status: spontaneous breathing Cardiovascular status: blood pressure returned to baseline Postop Assessment: no headache Anesthetic complications: no    Jaci Standard, III,  Toby Ayad D

## 2016-03-14 ENCOUNTER — Encounter: Payer: Self-pay | Admitting: Ophthalmology

## 2016-04-11 ENCOUNTER — Telehealth: Payer: Self-pay | Admitting: *Deleted

## 2016-04-11 NOTE — Telephone Encounter (Signed)
Received fax from [facility] requesting PA for esomeprazole 40 mg.  PA completed on CoverMyMeds.  Pending review.

## 2016-04-15 NOTE — Telephone Encounter (Signed)
PA Approved.  Authorization is good until 04/12/2017.

## 2016-04-25 DIAGNOSIS — Z961 Presence of intraocular lens: Secondary | ICD-10-CM | POA: Diagnosis not present

## 2016-05-14 ENCOUNTER — Telehealth: Payer: Self-pay | Admitting: Family Medicine

## 2016-05-14 ENCOUNTER — Other Ambulatory Visit (INDEPENDENT_AMBULATORY_CARE_PROVIDER_SITE_OTHER): Payer: Medicare HMO

## 2016-05-14 DIAGNOSIS — E781 Pure hyperglyceridemia: Secondary | ICD-10-CM

## 2016-05-14 DIAGNOSIS — M810 Age-related osteoporosis without current pathological fracture: Secondary | ICD-10-CM | POA: Diagnosis not present

## 2016-05-14 DIAGNOSIS — E1140 Type 2 diabetes mellitus with diabetic neuropathy, unspecified: Secondary | ICD-10-CM

## 2016-05-14 DIAGNOSIS — D509 Iron deficiency anemia, unspecified: Secondary | ICD-10-CM

## 2016-05-14 LAB — COMPREHENSIVE METABOLIC PANEL
ALBUMIN: 4.1 g/dL (ref 3.5–5.2)
ALT: 11 U/L (ref 0–35)
AST: 12 U/L (ref 0–37)
Alkaline Phosphatase: 53 U/L (ref 39–117)
BILIRUBIN TOTAL: 0.3 mg/dL (ref 0.2–1.2)
BUN: 15 mg/dL (ref 6–23)
CALCIUM: 9.5 mg/dL (ref 8.4–10.5)
CHLORIDE: 101 meq/L (ref 96–112)
CO2: 29 meq/L (ref 19–32)
CREATININE: 0.7 mg/dL (ref 0.40–1.20)
GFR: 87.19 mL/min (ref >60.00)
Glucose, Bld: 101 mg/dL — ABNORMAL HIGH (ref 70–99)
Potassium: 3.9 mEq/L (ref 3.5–5.1)
Sodium: 137 mEq/L (ref 135–145)
Total Protein: 6.6 g/dL (ref 6.0–8.3)

## 2016-05-14 LAB — CBC WITH DIFFERENTIAL/PLATELET
BASOS ABS: 0.1 10*3/uL (ref 0.0–0.1)
Basophils Relative: 1.2 % (ref 0.0–3.0)
EOS ABS: 0.1 10*3/uL (ref 0.0–0.7)
Eosinophils Relative: 1.9 % (ref 0.0–5.0)
HEMATOCRIT: 35.4 % — AB (ref 36.0–46.0)
HEMOGLOBIN: 11.3 g/dL — AB (ref 12.0–15.0)
LYMPHS PCT: 34.4 % (ref 12.0–46.0)
Lymphs Abs: 1.5 10*3/uL (ref 0.7–4.0)
MCHC: 31.8 g/dL (ref 30.0–36.0)
MCV: 84.2 fl (ref 78.0–100.0)
MONOS PCT: 11.6 % (ref 3.0–12.0)
Monocytes Absolute: 0.5 10*3/uL (ref 0.1–1.0)
NEUTROS ABS: 2.2 10*3/uL (ref 1.4–7.7)
NEUTROS PCT: 50.9 % (ref 43.0–77.0)
Platelets: 202 10*3/uL (ref 150.0–400.0)
RBC: 4.2 Mil/uL (ref 3.87–5.11)
RDW: 16.3 % — AB (ref 11.5–15.5)
WBC: 4.2 10*3/uL (ref 4.0–10.5)

## 2016-05-14 LAB — LIPID PANEL
CHOL/HDL RATIO: 3
Cholesterol: 194 mg/dL (ref 0–200)
HDL: 62.5 mg/dL (ref >39.00)
LDL Cholesterol: 111 mg/dL — ABNORMAL HIGH (ref 0–99)
NONHDL: 131.97
Triglycerides: 104 mg/dL (ref 0.0–149.0)
VLDL: 20.8 mg/dL (ref 0.0–40.0)

## 2016-05-14 LAB — IBC PANEL
IRON: 56 ug/dL (ref 42–145)
Saturation Ratios: 17.5 % — ABNORMAL LOW (ref 20.0–50.0)
Transferrin: 229 mg/dL (ref 212.0–360.0)

## 2016-05-14 LAB — HEMOGLOBIN A1C: Hgb A1c MFr Bld: 6 % (ref 4.6–6.5)

## 2016-05-14 LAB — FERRITIN: FERRITIN: 48.2 ng/mL (ref 10.0–291.0)

## 2016-05-14 LAB — VITAMIN D 25 HYDROXY (VIT D DEFICIENCY, FRACTURES): VITD: 38.76 ng/mL (ref 30.00–100.00)

## 2016-05-14 NOTE — Telephone Encounter (Signed)
-----   Message from Ellamae Sia sent at 05/02/2016  3:14 PM EDT ----- Regarding: Lab orders for Tuesday, 4.24.18 Lab orders for DM f/u

## 2016-05-21 ENCOUNTER — Ambulatory Visit
Admission: RE | Admit: 2016-05-21 | Discharge: 2016-05-21 | Disposition: A | Discharge status: Home / Self Care | Payer: Medicare HMO | Source: Ambulatory Visit | Attending: Family Medicine | Admitting: Family Medicine

## 2016-05-21 ENCOUNTER — Ambulatory Visit (INDEPENDENT_AMBULATORY_CARE_PROVIDER_SITE_OTHER)
Admission: RE | Admit: 2016-05-21 | Discharge: 2016-05-21 | Disposition: A | Discharge status: Home / Self Care | Payer: Medicare HMO | Source: Ambulatory Visit | Attending: Family Medicine | Admitting: Family Medicine

## 2016-05-21 ENCOUNTER — Encounter: Payer: Self-pay | Admitting: Family Medicine

## 2016-05-21 ENCOUNTER — Ambulatory Visit (INDEPENDENT_AMBULATORY_CARE_PROVIDER_SITE_OTHER): Payer: Medicare HMO | Admitting: Family Medicine

## 2016-05-21 VITALS — BP 122/70 | HR 57 | Temp 97.8°F | Ht 64.5 in | Wt 159.8 lb

## 2016-05-21 DIAGNOSIS — E1140 Type 2 diabetes mellitus with diabetic neuropathy, unspecified: Secondary | ICD-10-CM | POA: Diagnosis not present

## 2016-05-21 DIAGNOSIS — E1142 Type 2 diabetes mellitus with diabetic polyneuropathy: Secondary | ICD-10-CM | POA: Diagnosis not present

## 2016-05-21 DIAGNOSIS — I10 Essential (primary) hypertension: Secondary | ICD-10-CM

## 2016-05-21 DIAGNOSIS — M79672 Pain in left foot: Secondary | ICD-10-CM

## 2016-05-21 DIAGNOSIS — D509 Iron deficiency anemia, unspecified: Secondary | ICD-10-CM | POA: Diagnosis not present

## 2016-05-21 DIAGNOSIS — E7800 Pure hypercholesterolemia, unspecified: Secondary | ICD-10-CM | POA: Diagnosis not present

## 2016-05-21 DIAGNOSIS — M79671 Pain in right foot: Secondary | ICD-10-CM

## 2016-05-21 LAB — HM DIABETES FOOT EXAM

## 2016-05-21 MED ORDER — ATORVASTATIN CALCIUM 40 MG PO TABS: 40.0000 mg | ORAL_TABLET | Freq: Every day | ORAL | 3 refills | Status: DC

## 2016-05-21 NOTE — Assessment & Plan Note (Addendum)
LDL not at goal < 100, trigs in nml range. AHA risk10 year: 36%.. Statin indicated.  Start atorvastatin 40 mg daily.

## 2016-05-21 NOTE — Patient Instructions (Addendum)
Consider start water exercise for low impact exercise.  Decrease carbs in diet as able.  Start atorvastatin daily.    We will call with X-ray results.  Ice massage of bottom of foot.

## 2016-05-21 NOTE — Assessment & Plan Note (Addendum)
Stable control off iron.Faythe Ghee to stay off iron for now.

## 2016-05-21 NOTE — Assessment & Plan Note (Addendum)
Well controlled  With diet. 

## 2016-05-21 NOTE — Progress Notes (Signed)
Pre visit review using our clinic review tool, if applicable. No additional management support is needed unless otherwise documented below in the visit note. 

## 2016-05-21 NOTE — Assessment & Plan Note (Addendum)
No clearly her neuropathy causing issue. ? OA pain. No clear injury.  Will eval with -ray and possibly refer to foot MD.

## 2016-05-21 NOTE — Assessment & Plan Note (Signed)
Well controlled. Continue current medication.  

## 2016-05-21 NOTE — Progress Notes (Signed)
Subjective:    Patient ID: Ashley Ryan, female    DOB: [birth year], 71 y.o.   MRN: [id number]  HPI    71 year old female presents for follow up DM and other chronic issues.  Diabetes:   Well controlled on  No med. Lab Results  Component Value Date   HGBA1C 6.0 05/14/2016  Using medications without difficulties: Hypoglycemic episodes:? Hyperglycemic episodes:? Feet problems: diabetic neuropathy..  No burning on neurontin current dose. Ache in bilateral feet in last 2 weeks, no known injury, occ swelling in feet, no redness. Blood Sugars averaging: not checking eye exam within last year:yes  Hypertension:    Good control on amlodipine , lisinopril/HCTZ, atenolol BP Readings from Last 3 Encounters:  05/21/16 122/70  02/21/16 131/78  11/21/15 139/72  Using medication without problems or lightheadedness: none Chest pain with exertion:none Edema:occ Short of breath: deconditioning Average home BPs: stable Other issues:  Iron def anemia :  Hg stable almost nml, she has not been taking daily iron. Ferritin and transferrin nml, total iron nml. Lab Results  Component Value Date   HGB 11.3 (L) 05/14/2016    Elevated Cholesterol:   35 % 10 year risk of CAD per AHA risk cal... Family history of CAD and CVA. Recommended starting statin. Lab Results  Component Value Date   CHOL 194 05/14/2016   HDL 62.50 05/14/2016   LDLCALC 111 (H) 05/14/2016   LDLDIRECT 102.5 07/21/2009   TRIG 104.0 05/14/2016   CHOLHDL 3 05/14/2016   Moderate diet  no exercise   Review of Systems     Objective:   Physical Exam  Constitutional: Vital signs are normal. She appears well-developed and well-nourished. She is cooperative.  Non-toxic appearance. She does not appear ill. No distress.  obese  HENT:  Head: Normocephalic.  Right Ear: Hearing, tympanic membrane, external ear and ear canal normal. Tympanic membrane is not erythematous, not retracted and not bulging.  Left Ear: Hearing,  tympanic membrane, external ear and ear canal normal. Tympanic membrane is not erythematous, not retracted and not bulging.  Nose: No mucosal edema or rhinorrhea. Right sinus exhibits no maxillary sinus tenderness and no frontal sinus tenderness. Left sinus exhibits no maxillary sinus tenderness and no frontal sinus tenderness.  Mouth/Throat: Uvula is midline, oropharynx is clear and moist and mucous membranes are normal.  Eyes: Conjunctivae, EOM and lids are normal. Pupils are equal, round, and reactive to light. Lids are everted and swept, no foreign bodies found.  Neck: Trachea normal and normal range of motion. Neck supple. Carotid bruit is not present. No thyroid mass and no thyromegaly present.  Cardiovascular: Normal rate, regular rhythm, S1 normal, S2 normal, normal heart sounds, intact distal pulses and normal pulses.  Exam reveals no gallop and no friction rub.   No murmur heard. Mild bilateral edema, bilateral varicose viens  Pulmonary/Chest: Effort normal and breath sounds normal. No tachypnea. No respiratory distress. She has no decreased breath sounds. She has no wheezes. She has no rhonchi. She has no rales.  Abdominal: Soft. Normal appearance and bowel sounds are normal. There is no tenderness.  Neurological: She is alert.  Skin: Skin is warm, dry and intact. No rash noted.  Psychiatric: Her speech is normal and behavior is normal. Judgment and thought content normal. Her mood appears not anxious. Cognition and memory are normal. She does not exhibit a depressed mood.         Diabetic foot exam: Normal inspection TTP over dorsum of feet bialterall  and at MCP joint.. Also mild [pain over bilateral plantar fascia insertion No skin breakdown No calluses  Normal DP pulses diminished sensation to light touch and monofilament Nails normal  Assessment & Plan:

## 2016-07-25 ENCOUNTER — Telehealth: Payer: Self-pay | Admitting: *Deleted

## 2016-07-25 NOTE — Telephone Encounter (Signed)
Received fax from [facility] for approval of prescription drug coverage.  Lidocaine Patch 5% approved until 07/25/2017.

## 2016-08-20 ENCOUNTER — Other Ambulatory Visit: Payer: Self-pay | Admitting: *Deleted

## 2016-08-20 ENCOUNTER — Other Ambulatory Visit (INDEPENDENT_AMBULATORY_CARE_PROVIDER_SITE_OTHER): Payer: Medicare HMO

## 2016-08-20 ENCOUNTER — Telehealth: Payer: Self-pay | Admitting: Family Medicine

## 2016-08-20 DIAGNOSIS — D509 Iron deficiency anemia, unspecified: Secondary | ICD-10-CM

## 2016-08-20 DIAGNOSIS — E1140 Type 2 diabetes mellitus with diabetic neuropathy, unspecified: Secondary | ICD-10-CM | POA: Diagnosis not present

## 2016-08-20 DIAGNOSIS — M810 Age-related osteoporosis without current pathological fracture: Secondary | ICD-10-CM

## 2016-08-20 LAB — COMPREHENSIVE METABOLIC PANEL
ALBUMIN: 3.9 g/dL (ref 3.5–5.2)
ALK PHOS: 52 U/L (ref 39–117)
ALT: 9 U/L (ref 0–35)
AST: 11 U/L (ref 0–37)
BUN: 15 mg/dL (ref 6–23)
CHLORIDE: 103 meq/L (ref 96–112)
CO2: 29 mEq/L (ref 19–32)
Calcium: 9.3 mg/dL (ref 8.4–10.5)
Creatinine, Ser: 0.72 mg/dL (ref 0.40–1.20)
GFR: 84.34 mL/min (ref >60.00)
Glucose, Bld: 104 mg/dL — ABNORMAL HIGH (ref 70–99)
POTASSIUM: 3.3 meq/L — AB (ref 3.5–5.1)
SODIUM: 139 meq/L (ref 135–145)
TOTAL PROTEIN: 6.5 g/dL (ref 6.0–8.3)
Total Bilirubin: 0.4 mg/dL (ref 0.2–1.2)

## 2016-08-20 LAB — LIPID PANEL
Cholesterol: 117 mg/dL (ref 0–200)
HDL: 46.9 mg/dL (ref >39.00)
LDL CALC: 43 mg/dL (ref 0–99)
NonHDL: 69.82
Total CHOL/HDL Ratio: 2
Triglycerides: 132 mg/dL (ref 0.0–149.0)
VLDL: 26.4 mg/dL (ref 0.0–40.0)

## 2016-08-20 LAB — HEMOGLOBIN A1C: HEMOGLOBIN A1C: 6.1 % (ref 4.6–6.5)

## 2016-08-20 MED ORDER — TRAMADOL HCL 50 MG PO TABS: 50.0000 mg | ORAL_TABLET | Freq: Four times a day (QID) | ORAL | 0 refills | Status: DC | PRN

## 2016-08-20 MED ORDER — LIDOCAINE 5 % EX PTCH: MEDICATED_PATCH | CUTANEOUS | 0 refills | Status: DC

## 2016-08-20 NOTE — Telephone Encounter (Signed)
-----   Message from Ellamae Sia sent at 08/07/2016  4:23 PM EDT ----- Regarding: Lab orders for Tuesday, 7.31.18 Lab orders for a 3 month follow up appt.

## 2016-08-20 NOTE — Telephone Encounter (Addendum)
Last office visit 05/21/2016. Lidocaine patch last refilled 10/22/2015 for #90 with no refills.  Tramadol 11/21/2015 for #90 with no refills.  Ok to refill?

## 2016-08-20 NOTE — Telephone Encounter (Signed)
Tramadol rx faxed to [facility].

## 2016-08-22 ENCOUNTER — Other Ambulatory Visit: Payer: Medicare HMO

## 2016-08-27 DIAGNOSIS — Z96652 Presence of left artificial knee joint: Secondary | ICD-10-CM | POA: Diagnosis not present

## 2016-08-27 DIAGNOSIS — M1711 Unilateral primary osteoarthritis, right knee: Secondary | ICD-10-CM | POA: Diagnosis not present

## 2016-08-29 DIAGNOSIS — H43812 Vitreous degeneration, left eye: Secondary | ICD-10-CM | POA: Diagnosis not present

## 2016-09-25 ENCOUNTER — Ambulatory Visit (INDEPENDENT_AMBULATORY_CARE_PROVIDER_SITE_OTHER)
Admission: RE | Admit: 2016-09-25 | Discharge: 2016-09-25 | Disposition: A | Discharge status: Home / Self Care | Payer: Medicare HMO | Source: Ambulatory Visit | Attending: Family Medicine | Admitting: Family Medicine

## 2016-09-25 ENCOUNTER — Encounter: Payer: Self-pay | Admitting: Family Medicine

## 2016-09-25 ENCOUNTER — Ambulatory Visit
Admission: RE | Admit: 2016-09-25 | Discharge: 2016-09-25 | Disposition: A | Discharge status: Home / Self Care | Payer: Medicare HMO | Source: Ambulatory Visit | Attending: Family Medicine | Admitting: Family Medicine

## 2016-09-25 ENCOUNTER — Ambulatory Visit (INDEPENDENT_AMBULATORY_CARE_PROVIDER_SITE_OTHER): Payer: Medicare HMO | Admitting: Family Medicine

## 2016-09-25 VITALS — BP 130/72 | HR 68 | Temp 98.2°F | Ht 64.5 in | Wt 159.5 lb

## 2016-09-25 DIAGNOSIS — M79672 Pain in left foot: Secondary | ICD-10-CM | POA: Diagnosis not present

## 2016-09-25 DIAGNOSIS — M25572 Pain in left ankle and joints of left foot: Secondary | ICD-10-CM | POA: Diagnosis not present

## 2016-09-25 DIAGNOSIS — M25579 Pain in unspecified ankle and joints of unspecified foot: Secondary | ICD-10-CM | POA: Diagnosis not present

## 2016-09-25 MED ORDER — PREDNISONE 20 MG PO TABS: ORAL_TABLET | ORAL | 0 refills | Status: DC

## 2016-09-25 NOTE — Progress Notes (Signed)
Dr. Frederico Hamman T. Lamont Glasscock, MD, [facility] [place], [address] Phone: [phone] Fax: [phone]  09/25/2016  Patient: Ashley Ryan, MRN: [id number], DOB: Mar 22, 1945, 71 y.o.  Primary Physician:  [name], MD   Chief Complaint  Patient presents with  . Foot Pain    Left-No injury   Subjective:   MARCIEL Ryan is a 71 y.o. very pleasant female patient who presents with the following:  No injury. Started about a week ago - worse in the last couple of days.  Very pleasant lady who presents with pain in the midfoot as well as in the forefoot around the second third MTPs primarily without any known injury.  Past Medical History, Surgical History, Social History, Family History, Problem List, Medications, and Allergies have been reviewed and updated if relevant.  Patient Active Problem List   Diagnosis Date Noted  . Bilateral foot pain 05/21/2016  . Counseling regarding end of life decision making 11/10/2014  . Closed fracture of fourth metatarsal of right foot 11/01/2014  . 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 GERD (gastroesophageal reflux disease) 10/07/2013  . Stricture and stenosis of esophagus 10/07/2013  . Iron deficiency anemia 09/08/2013  . Osteoporosis 04/30/2012  . De Quervain's tenosynovitis, left 07/05/2011  . Diabetic peripheral neuropathy ([place]) 12/27/2009  . Osteoarthritis, bilateral knees 11/23/2009  . Controlled type 2 diabetes with neuropathy ([place]) 02/03/2009  . High cholesterol 02/03/2009  . COMMON MIGRAINE 11/03/2008  . ESSENTIAL HYPERTENSION, BENIGN 11/03/2008  . ALLERGIC RHINITIS DUE TO POLLEN 11/03/2008  . HIATAL HERNIA WITH REFLUX 11/03/2008  . DIVERTICULOSIS OF COLON 11/03/2008  . MURMUR 11/03/2008  . GASTRIC ULCER, HX OF 11/03/2008    Past Medical History:  Diagnosis Date  . Anemia   . Arthritis   . Chronic headache   . Complication of anesthesia    prior to 1991 used to have PONV.   none recently.  . Diverticulosis of colon   . GERD with stricture   . Heart murmur    followed by PCP  . Hiatal hernia   . Hypertension   . Neuropathy    feet  . Osteoporosis   . Pre-diabetes   . Wears dentures    full upper    Past Surgical History:  Procedure Laterality Date  . ABDOMINAL HYSTERECTOMY     one ovary remains  . CARDIAC CATHETERIZATION     "yrs ago" all OK.  Marland Kitchen CATARACT EXTRACTION W/PHACO Left 02/21/2016   Procedure: CATARACT EXTRACTION PHACO AND INTRAOCULAR LENS PLACEMENT ([place])  Left eye;  Surgeon: Leandrew Koyanagi, MD;  Location: [facility];  Service: Ophthalmology;  Laterality: Left;  Left eye Diabetic  . CATARACT EXTRACTION W/PHACO Right 03/13/2016   Procedure: CATARACT EXTRACTION PHACO AND INTRAOCULAR LENS PLACEMENT ([place])  right  diabetic;  Surgeon: Leandrew Koyanagi, MD;  Location: [facility];  Service: Ophthalmology;  Laterality: Right;  diabetic  . CHOLECYSTECTOMY      Social History   Social History  . Marital status: Married    Spouse name: N/A  . Number of children: 2  . Years of education: N/A   Occupational History  . retired cna Retired   Social History Main Topics  . Smoking status: Never Smoker  . Smokeless tobacco: Never Used  . Alcohol use No  . Drug use: No  . Sexual activity: No   Other Topics Concern  . Not on file   Social History Narrative  Drinks sweet tea, diet sodas 4 a day   No living will, full code (reviewed 2014)    Family History  Problem Relation Age of Onset  . Breast cancer Unknown   . Stomach cancer Unknown   . Diabetes Unknown   . Heart disease Unknown   . Breast cancer Sister 58    Allergies  Allergen Reactions  . Sulfonamide Derivatives   . Sulfa Antibiotics Rash    Medication list reviewed and updated in full in [place].  GEN: No fevers, chills. Nontoxic. Primarily MSK c/o today. MSK: Detailed in the HPI GI: tolerating PO intake without difficulty Neuro: No  numbness, parasthesias, or tingling associated. Otherwise the pertinent positives of the ROS are noted above.   Objective:   BP 130/72   Pulse 68   Temp 98.2 F (36.8 C) (Oral)   Ht 5' 4.5" (1.638 m)   Wt 159 lb 8 oz (72.3 kg)   BMI 26.96 kg/m    GEN: WDWN, NAD, Non-toxic, A & O x 3 HEENT: Atraumatic, Normocephalic. Neck supple. No masses, No LAD. Ears and Nose: No external deformity. CV: RRR, No M/G/R. No JVD. No thrill. No extra heart sounds. PULM: CTA B, no wheezes, crackles, rhonchi. No retractions. No resp. distress. No accessory muscle use. EXTR: No c/c/e NEURO Normal gait.  PSYCH: Normally interactive. Conversant. Not depressed or anxious appearing.  Calm demeanor.    Left foot and ankle there is some mild tenderness in the true ankle joint but range of motion is relatively preserved.  She also has some puffiness at the sinus Tarsi which is mildly tender.  Strength is 5/5.  Mild tenderness in with movement at the metatarsal joints including 2 and 3.  AT mildly tender  Radiology: Dg Ankle Complete Left  Result Date: 09/25/2016 CLINICAL DATA:  Acute left ankle pain.  History of neuropathy. EXAM: LEFT ANKLE COMPLETE - 3+ VIEW COMPARISON:  None. FINDINGS: There is no evidence of fracture, dislocation, or joint effusion. Pes cavus. Small plantar heel spur. IMPRESSION: 1. No acute or degenerative finding. 2. Pes cavus. Electronically Signed   By: Monte Fantasia M.D.   On: 09/25/2016 16:43   Dg Foot Complete Left  Result Date: 09/25/2016 CLINICAL DATA:  Acute left foot pain.  Neuropathy. EXAM: LEFT FOOT - COMPLETE 3+ VIEW COMPARISON:  05/21/2016 FINDINGS: There is no evidence of fracture or dislocation. No erosive or degenerative joint narrowing. Pes cavus. Small plantar heel spur. Bipartite lateral great toe sesamoid. IMPRESSION: No acute finding or change from 05/21/2016. Electronically Signed   By: Monte Fantasia M.D.   On: 09/25/2016 16:42     Assessment and Plan:    Acute foot pain, left - Plan: DG Foot Complete Left  Acute left ankle pain - Plan: DG Ankle Complete Left  Sinus tarsi syndrome, unspecified laterality  Multi-joint involvement, multi-joint arthritis of the foot and ankle on the left with sinus Tarsi syndrome.  She also has some tenderness at the Achilles insertion on the left.  Recommended some over-the-counter inserts for her size 3 foot.  Prednisone burst to hit all of these at one time.  Follow-up: No Follow-up on file.  Future Appointments Date Time Provider [place]  11/12/2016 8:15 AM Eustace Pen, LPN [facility]  11/21/2016 2:00 PM [name], MD [facility]    Meds ordered this encounter  Medications  . predniSONE (DELTASONE) 20 MG tablet    Sig: 2 tabs po for 4 days, then 1  tab po for 3 days    Dispense:  11 tablet    Refill:  0   There are no discontinued medications. Orders Placed This Encounter  Procedures  . DG Foot Complete Left  . DG Ankle Complete Left    Signed,  Denell Cothern T. Janiece Scovill, MD   Patient's Medications  New Prescriptions   PREDNISONE (DELTASONE) 20 MG TABLET    2 tabs po for 4 days, then 1 tab po for 3 days  Previous Medications   ACCU-CHEK SOFTCLIX LANCETS LANCETS    Use to check blood sugar once daily   ALCOHOL SWABS (B-D SINGLE USE SWABS REGULAR) PADS    Use to check blood sugar once daily   AMLODIPINE (NORVASC) 5 MG TABLET    Take 1 tablet (5 mg total) by mouth daily.   ASPIRIN EC 81 MG TABLET    Take 81 mg by mouth.   ATENOLOL (TENORMIN) 100 MG TABLET    TAKE 1 TABLET (100 MG TOTAL) BY MOUTH DAILY.   ATORVASTATIN (LIPITOR) 40 MG TABLET    Take 1 tablet (40 mg total) by mouth daily.   BLOOD GLUCOSE CALIBRATION (ACCU-CHEK AVIVA) SOLN    Use to check blood sugar once daily   BLOOD GLUCOSE MONITORING SUPPL (ACCU-CHEK AVIVA PLUS) W/DEVICE KIT    Use to check blood sugar once daily   CALCIUM CARBONATE-VITAMIN D (CALCIUM 600+D) 600-400 MG-UNIT PER  TABLET    Take 1 tablet by mouth daily.     CALCIUM POLYCARBOPHIL (FIBER-CAPS PO)    Take 1 capsule by mouth daily.   ESOMEPRAZOLE (NEXIUM) 40 MG CAPSULE    TAKE 1 CAPSULE (40 MG TOTAL) BY MOUTH 2 (TWO) TIMES DAILY BEFORE A MEAL.   FLUOXETINE (PROZAC) 20 MG CAPSULE    Take 1 capsule (20 mg total) by mouth daily.   FLUTICASONE (FLONASE) 50 MCG/ACT NASAL SPRAY    PLACE 2 SPRAYS INTO THE NOSE DAILY.   GABAPENTIN (NEURONTIN) 100 MG CAPSULE    TAKE 1 CAPSULE IN AM, 1 CAPSULE AT LUNCH, 1 CAPSULES AT DINNER AND 2-3 CAPSULES AT BEDTIME   GLUCOSAMINE 500 MG TABS    Take 1 tablet by mouth daily.     GLUCOSE BLOOD (ACCU-CHEK AVIVA) TEST STRIP    Use to check blood sugar once daily   HYDROCODONE-ACETAMINOPHEN (NORCO/VICODIN) 5-325 MG TABLET    Take 1 tablet by mouth 3 (three) times daily as needed for moderate pain.   IBANDRONATE (BONIVA) 150 MG TABLET    Take 1 tablet (150 mg total) by mouth every 30 (thirty) days. Take in the morning with a full glass of water, on an empty stomach, and do not take anything else by mouth or lie down for the next 60 minutes.   LATANOPROST (XALATAN) 0.005 % OPHTHALMIC SOLUTION    PLACE 1 DROP EVRYDAY INTO BOTH EYES   LIDOCAINE (LIDODERM) 5 %    PLACE 1 PATCH ONTO THE SKIN DAILY. REMOVE & DISCARD PATCH WITHIN 12 HOURS OR AS DIRECTED BY MD   LORATADINE (CLARITIN) 10 MG CAPS    Take 1 capsule by mouth as needed.     LOSARTAN-HYDROCHLOROTHIAZIDE (HYZAAR) 100-25 MG TABLET    Take 1 tablet by mouth daily.   OMEGA-3 FATTY ACIDS (FISH OIL) 1000 MG CAPS    Take 1 capsule by mouth daily.   TRAMADOL (ULTRAM) 50 MG TABLET    Take 1 tablet (50 mg total) by mouth every 6 (six) hours as needed.   VITAMIN B-12 (CYANOCOBALAMIN)  1000 MCG TABLET    Take 1,000 mcg by mouth daily.  Modified Medications   No medications on file  Discontinued Medications   No medications on file

## 2016-09-30 DIAGNOSIS — H43812 Vitreous degeneration, left eye: Secondary | ICD-10-CM | POA: Diagnosis not present

## 2016-09-30 LAB — HM DIABETES EYE EXAM

## 2016-10-07 ENCOUNTER — Ambulatory Visit: Payer: Medicare HMO | Admitting: Family Medicine

## 2016-10-16 ENCOUNTER — Ambulatory Visit: Payer: Medicare HMO | Admitting: Family Medicine

## 2016-10-21 ENCOUNTER — Ambulatory Visit: Payer: Medicare HMO | Admitting: Family Medicine

## 2016-10-22 ENCOUNTER — Other Ambulatory Visit: Payer: Self-pay | Admitting: Family Medicine

## 2016-10-23 DIAGNOSIS — Q667 Congenital pes cavus: Secondary | ICD-10-CM | POA: Diagnosis not present

## 2016-10-23 DIAGNOSIS — M1711 Unilateral primary osteoarthritis, right knee: Secondary | ICD-10-CM | POA: Diagnosis not present

## 2016-10-28 ENCOUNTER — Ambulatory Visit: Payer: Medicare HMO | Admitting: Family Medicine

## 2016-11-01 DIAGNOSIS — M25562 Pain in left knee: Secondary | ICD-10-CM | POA: Diagnosis not present

## 2016-11-01 DIAGNOSIS — R262 Difficulty in walking, not elsewhere classified: Secondary | ICD-10-CM | POA: Diagnosis not present

## 2016-11-01 DIAGNOSIS — M25879 Other specified joint disorders, unspecified ankle and foot: Secondary | ICD-10-CM | POA: Diagnosis not present

## 2016-11-05 DIAGNOSIS — M25562 Pain in left knee: Secondary | ICD-10-CM | POA: Diagnosis not present

## 2016-11-05 DIAGNOSIS — R262 Difficulty in walking, not elsewhere classified: Secondary | ICD-10-CM | POA: Diagnosis not present

## 2016-11-05 DIAGNOSIS — M25872 Other specified joint disorders, left ankle and foot: Secondary | ICD-10-CM | POA: Diagnosis not present

## 2016-11-12 ENCOUNTER — Ambulatory Visit (INDEPENDENT_AMBULATORY_CARE_PROVIDER_SITE_OTHER): Payer: Medicare HMO

## 2016-11-12 VITALS — BP 118/70 | HR 62 | Temp 98.3°F | Ht 61.0 in | Wt 155.0 lb

## 2016-11-12 DIAGNOSIS — Z0000 Encounter for general adult medical examination without abnormal findings: Secondary | ICD-10-CM | POA: Diagnosis not present

## 2016-11-12 DIAGNOSIS — I10 Essential (primary) hypertension: Secondary | ICD-10-CM | POA: Diagnosis not present

## 2016-11-12 DIAGNOSIS — E538 Deficiency of other specified B group vitamins: Secondary | ICD-10-CM | POA: Diagnosis not present

## 2016-11-12 DIAGNOSIS — R262 Difficulty in walking, not elsewhere classified: Secondary | ICD-10-CM | POA: Diagnosis not present

## 2016-11-12 DIAGNOSIS — R7309 Other abnormal glucose: Secondary | ICD-10-CM

## 2016-11-12 DIAGNOSIS — M25872 Other specified joint disorders, left ankle and foot: Secondary | ICD-10-CM | POA: Diagnosis not present

## 2016-11-12 DIAGNOSIS — M25562 Pain in left knee: Secondary | ICD-10-CM | POA: Diagnosis not present

## 2016-11-12 DIAGNOSIS — E1140 Type 2 diabetes mellitus with diabetic neuropathy, unspecified: Secondary | ICD-10-CM

## 2016-11-12 DIAGNOSIS — E7800 Pure hypercholesterolemia, unspecified: Secondary | ICD-10-CM

## 2016-11-12 LAB — COMPREHENSIVE METABOLIC PANEL
ALK PHOS: 61 U/L (ref 39–117)
ALT: 13 U/L (ref 0–35)
AST: 14 U/L (ref 0–37)
Albumin: 3.9 g/dL (ref 3.5–5.2)
BUN: 18 mg/dL (ref 6–23)
CO2: 29 mEq/L (ref 19–32)
Calcium: 9.4 mg/dL (ref 8.4–10.5)
Chloride: 101 mEq/L (ref 96–112)
Creatinine, Ser: 0.74 mg/dL (ref 0.40–1.20)
GFR: 81.66 mL/min (ref >60.00)
Glucose, Bld: 105 mg/dL — ABNORMAL HIGH (ref 70–99)
POTASSIUM: 3.3 meq/L — AB (ref 3.5–5.1)
SODIUM: 138 meq/L (ref 135–145)
TOTAL PROTEIN: 6.5 g/dL (ref 6.0–8.3)
Total Bilirubin: 0.5 mg/dL (ref 0.2–1.2)

## 2016-11-12 LAB — LIPID PANEL
CHOLESTEROL: 129 mg/dL (ref 0–200)
HDL: 47.3 mg/dL (ref >39.00)
LDL CALC: 62 mg/dL (ref 0–99)
NonHDL: 81.84
TRIGLYCERIDES: 101 mg/dL (ref 0.0–149.0)
Total CHOL/HDL Ratio: 3
VLDL: 20.2 mg/dL (ref 0.0–40.0)

## 2016-11-12 LAB — HEMOGLOBIN A1C: Hgb A1c MFr Bld: 6 % (ref 4.6–6.5)

## 2016-11-12 LAB — TSH: TSH: 1.57 u[IU]/mL (ref 0.35–4.50)

## 2016-11-12 LAB — VITAMIN B12: Vitamin B-12: 428 pg/mL (ref 211–911)

## 2016-11-12 NOTE — Progress Notes (Signed)
Subjective:   MEGAHN KILLINGS is a 71 y.o. female who presents for Medicare Annual (Subsequent) preventive examination.  Review of Systems:  N/A Cardiac Risk Factors include: advanced age (>55mn, >>37women);obesity (BMI >30kg/m2);diabetes mellitus;hypertension     Objective:     Vitals: BP 118/70 (BP Location: Right Arm, Patient Position: Sitting, Cuff Size: Normal)   Pulse 62   Temp 98.3 F (36.8 C) (Oral)   Ht 5' 1" (1.549 m) Comment: shoes  Wt 155 lb (70.3 kg)   SpO2 95%   BMI 29.29 kg/m   Body mass index is 29.29 kg/m.   Tobacco History  Smoking Status  . Never Smoker  Smokeless Tobacco  . Never Used     Counseling given: No   Past Medical History:  Diagnosis Date  . Anemia   . Arthritis   . Chronic headache   . Complication of anesthesia    prior to 1991 used to have PONV.  none recently.  . Diverticulosis of colon   . GERD with stricture   . Heart murmur    followed by PCP  . Hiatal hernia   . Hypertension   . Neuropathy    feet  . Osteoporosis   . Pre-diabetes   . Wears dentures    full upper   Past Surgical History:  Procedure Laterality Date  . ABDOMINAL HYSTERECTOMY     one ovary remains  . CARDIAC CATHETERIZATION     "yrs ago" all OK.  .Marland KitchenCATARACT EXTRACTION W/PHACO Left 02/21/2016   Procedure: CATARACT EXTRACTION PHACO AND INTRAOCULAR LENS PLACEMENT ([place]  Left eye;  Surgeon: CLeandrew Koyanagi MD;  [facility]  Service: Ophthalmology;  Laterality: Left;  Left eye Diabetic  . CATARACT EXTRACTION W/PHACO Right 03/13/2016   Procedure: CATARACT EXTRACTION [place] AND INTRAOCULAR LENS PLACEMENT [place]  right  diabetic;  Surgeon: CLeandrew Koyanagi MD;  [facility]  Service: Ophthalmology;  Laterality: Right;  diabetic  . CHOLECYSTECTOMY     Family History  Problem Relation Age of Onset  . Breast cancer Unknown   . Stomach cancer Unknown   . Diabetes Unknown   . Heart disease Unknown   . Breast  cancer Sister 51  History  Sexual Activity  . Sexual activity: No    Outpatient Encounter Prescriptions as of 11/12/2016  Medication Sig  . ACCU-CHEK SOFTCLIX LANCETS lancets Use to check blood sugar once daily  . Alcohol Swabs (B-D SINGLE USE SWABS REGULAR) PADS Use to check blood sugar once daily  . amLODipine (NORVASC) 5 MG tablet Take 1 tablet (5 mg total) by mouth daily.  .Marland Kitchenaspirin EC 81 MG tablet Take 81 mg by mouth.  .Marland Kitchenatenolol (TENORMIN) 100 MG tablet TAKE 1 TABLET (100 MG TOTAL) BY MOUTH DAILY.  .Marland Kitchenatorvastatin (LIPITOR) 40 MG tablet Take 1 tablet (40 mg total) by mouth daily.  . Blood Glucose Calibration (ACCU-CHEK AVIVA) SOLN Use to check blood sugar once daily  . Blood Glucose Monitoring Suppl (ACCU-CHEK AVIVA PLUS) W/DEVICE KIT Use to check blood sugar once daily  . Calcium Carbonate-Vitamin D (CALCIUM 600+D) 600-400 MG-UNIT per tablet Take 1 tablet by mouth daily.    . Calcium Polycarbophil (FIBER-CAPS PO) Take 1 capsule by mouth daily.  .Marland Kitchenesomeprazole (NEXIUM) 40 MG capsule TAKE 1 CAPSULE (40 MG TOTAL) BY MOUTH 2 (TWO) TIMES DAILY BEFORE A MEAL.  .Marland KitchenFLUoxetine (PROZAC) 20 MG capsule Take 1 capsule (20 mg total) by mouth daily.  . fluticasone (FLONASE)  50 MCG/ACT nasal spray PLACE 2 SPRAYS INTO THE NOSE DAILY AS DIRECTED  . gabapentin (NEURONTIN) 100 MG capsule TAKE 1 CAPSULE IN AM, 1 CAPSULE AT LUNCH, 1 CAPSULES AT DINNER AND 2-3 CAPSULES AT BEDTIME  . Glucosamine 500 MG TABS Take 1 tablet by mouth daily.    Marland Kitchen glucose blood (ACCU-CHEK AVIVA) test strip Use to check blood sugar once daily  . HYDROcodone-acetaminophen (NORCO/VICODIN) 5-325 MG tablet Take 1 tablet by mouth 3 (three) times daily as needed for moderate pain.  Marland Kitchen ibandronate (BONIVA) 150 MG tablet Take 1 tablet (150 mg total) by mouth every 30 (thirty) days. Take in the morning with a full glass of water, on an empty stomach, and do not take anything else by mouth or lie down for the next 60 minutes.  . latanoprost  (XALATAN) 0.005 % ophthalmic solution PLACE 1 DROP EVRYDAY INTO BOTH EYES  . lidocaine (LIDODERM) 5 % PLACE 1 PATCH ONTO THE SKIN DAILY. REMOVE & DISCARD PATCH WITHIN 12 HOURS OR AS DIRECTED BY MD  . Loratadine (CLARITIN) 10 MG CAPS Take 1 capsule by mouth as needed.    Marland Kitchen losartan-hydrochlorothiazide (HYZAAR) 100-25 MG tablet Take 1 tablet by mouth daily.  . Omega-3 Fatty Acids (FISH OIL) 1000 MG CAPS Take 1 capsule by mouth daily.  . Pseudoephedrine HCl (SUDAFED PO) Take by mouth as needed.  . traMADol (ULTRAM) 50 MG tablet Take 1 tablet (50 mg total) by mouth every 6 (six) hours as needed.  . vitamin B-12 (CYANOCOBALAMIN) 1000 MCG tablet Take 1,000 mcg by mouth daily.  . [DISCONTINUED] predniSONE (DELTASONE) 20 MG tablet 2 tabs po for 4 days, then 1 tab po for 3 days   No facility-administered encounter medications on file as of 11/12/2016.     Activities of Daily Living In your present state of health, do you have any difficulty performing the following activities: 11/12/2016 03/13/2016  Hearing? Y N  Vision? N N  Difficulty concentrating or making decisions? N N  Walking or climbing stairs? Y N  Dressing or bathing? N N  Doing errands, shopping? N -  Preparing Food and eating ? N -  Using the Toilet? N -  In the past six months, have you accidently leaked urine? Y -  Do you have problems with loss of bowel control? N -  Managing your Medications? N -  Managing your Finances? N -  Housekeeping or managing your Housekeeping? N -  Some recent data might be hidden    Patient Care Team: [name], MD as PCP - General Leandrew Koyanagi, MD as Referring Physician (Ophthalmology)    Assessment:    Hearing Screening Comments: Bilateral hearing aids Vision Screening Comments: Last vision exam in Jan 2018 with  Dr. Wallace Going  Exercise Activities and Dietary recommendations Current Exercise Habits: Home exercise routine;Structured exercise class (physical therapy 60 min twice  weekly), Type of exercise: Other - see comments (physical therapy exercises 15 min), Time (Minutes): 15, Frequency (Times/Week): 7, Weekly Exercise (Minutes/Week): 105, Intensity: Mild, Exercise limited by: None identified  Goals    . Increase physical activity          Starting 11/12/2016, I will continue to exercise for at least 15 min daily.       Fall Risk Fall Risk  11/12/2016 11/09/2015 11/10/2014 08/10/2013 04/02/2012  Falls in the past year? Yes Yes Yes No No  Number falls in past yr: 2 or more 2 or more 2 or more - -  Injury with Fall? Yes No Yes - -  Risk Factor Category  High Fall Risk High Fall Risk High Fall Risk - -  Risk for fall due to : Impaired balance/gait History of fall(s);Other (Comment) History of fall(s) - -  Risk for fall due to: Comment - pt has neuropathy in both feet - - -  Follow up - Falls evaluation completed;Falls prevention discussed - - -   Depression Screen PHQ 2/9 Scores 11/12/2016 11/09/2015 11/10/2014 08/10/2013  PHQ - 2 Score 0 0 0 0  PHQ- 9 Score 0 - - -     Cognitive Function MMSE - Mini Mental State Exam 11/12/2016 11/09/2015  Orientation to time 5 5  Orientation to Place 5 5  Registration 3 3  Attention/ Calculation 0 0  Recall 3 2  Recall-comments - pt was unable to recall 1 of 3 words  Language- name 2 objects 0 0  Language- repeat 1 1  Language- follow 3 step command 3 3  Language- read & follow direction 0 0  Write a sentence 0 0  Copy design 0 0  Total score 20 19     PLEASE NOTE: A Mini-Cog screen was completed. Maximum score is 20. A value of 0 denotes this part of [place] MMSE was not completed or the patient failed this part of the Mini-Cog screening.   Mini-Cog Screening Orientation to Time - Max 5 pts Orientation to Place - Max 5 pts Registration - Max 3 pts Recall - Max 3 pts Language Repeat - Max 1 pts Language Follow 3 Step Command - Max 3 pts     Immunization History  Administered Date(s) Administered  .  Influenza Split 10/08/2010, 10/15/2011  . Influenza Whole 11/03/2008, 11/16/2009  . Influenza,inj,Quad PF,6+ Mos 10/29/2013, 11/01/2014, 11/09/2015  . PPD Test 05/03/2011, 05/04/2012, 06/06/2015  . Pneumococcal Conjugate-13 10/29/2013  . Pneumococcal Polysaccharide-23 12/22/2007, 11/09/2015  . Td 01/22/2007  . Tdap 10/15/2011   Screening Tests Health Maintenance  Topic Date Due  . INFLUENZA VACCINE  04/20/2017 (Originally 08/21/2016)  . MAMMOGRAM  11/12/2017 (Originally 07/04/2016)  . OPHTHALMOLOGY EXAM  02/01/2017  . HEMOGLOBIN A1C  02/20/2017  . FOOT EXAM  05/21/2017  . COLONOSCOPY  09/10/2018  . TETANUS/TDAP  10/14/2021  . DEXA SCAN  Completed  . Hepatitis C Screening  Completed  . PNA vac Low Risk Adult  Completed      Plan:   I have personally reviewed, addressed, and noted the following in the patient's chart:  A. Medical and social history B. Use of alcohol, tobacco or illicit drugs  C. Current medications and supplements D. Functional ability and status E.  Nutritional status F.  Physical activity G. Advance directives H. List of other physicians I.  Hospitalizations, surgeries, and ER visits in previous 12 months J.  [place] to include hearing, vision, cognitive, depression L. Referrals and appointments - none  In addition, I have reviewed and discussed with patient certain preventive protocols, quality metrics, and best practice recommendations. A written personalized care plan for preventive services as well as general preventive health recommendations were provided to patient.  See attached scanned questionnaire for additional information.   Signed,   Lindell Noe, MHA, BS, LPN Health Coach

## 2016-11-12 NOTE — Patient Instructions (Signed)
Ashley Ryan , Thank you for taking time to come for your Medicare Wellness Visit. I appreciate your ongoing commitment to your health goals. Please review the following plan we discussed and let me know if I can assist you in the future.   These are the goals we discussed: Goals    . Increase physical activity          Starting 11/12/2016, I will continue to exercise for at least 15 min daily.        This is a list of the screening recommended for you and due dates:  Health Maintenance  Topic Date Due  . Flu Shot  04/20/2017*  . Mammogram  11/12/2017*  . Eye exam for diabetics  02/01/2017  . Hemoglobin A1C  02/20/2017  . Complete foot exam   05/21/2017  . Colon Cancer Screening  09/10/2018  . Tetanus Vaccine  10/14/2021  . DEXA scan (bone density measurement)  Completed  .  Hepatitis C: One time screening is recommended by Center for Disease Control  (CDC) for  adults born from [birth year] through 1967.   Completed  . Pneumonia vaccines  Completed  *Topic was postponed. The date shown is not the original due date.   Preventive Care for Adults  A healthy lifestyle and preventive care can promote health and wellness. Preventive health guidelines for adults include the following key practices.  . A routine yearly physical is a good way to check with your health care provider about your health and preventive screening. It is a chance to share any concerns and updates on your health and to receive a thorough exam.  . Visit your dentist for a routine exam and preventive care every 6 months. Brush your teeth twice a day and floss once a day. Good oral hygiene prevents tooth decay and gum disease.  . The frequency of eye exams is based on your age, health, family medical history, use  of contact lenses, and other factors. Follow your health care provider's recommendations for frequency of eye exams.  . Eat a healthy diet. Foods like vegetables, fruits, whole grains, low-fat dairy products, and  lean protein foods contain the nutrients you need without too many calories. Decrease your intake of foods high in solid fats, added sugars, and salt. Eat the right amount of calories for you. Get information about a proper diet from your health care provider, if necessary.  . Regular physical exercise is one of the most important things you can do for your health. Most adults should get at least 150 minutes of moderate-intensity exercise (any activity that increases your heart rate and causes you to sweat) each week. In addition, most adults need muscle-strengthening exercises on 2 or more days a week.  Silver Sneakers may be a benefit available to you. To determine eligibility, you may visit the website: www.silversneakers.com or contact program at 671-269-7839 Mon-Fri between 8AM-8PM.   . Maintain a healthy weight. The body mass index (BMI) is a screening tool to identify possible weight problems. It provides an estimate of body fat based on height and weight. Your health care provider can find your BMI and can help you achieve or maintain a healthy weight.   For adults 20 years and older: ? A BMI below 18.5 is considered underweight. ? A BMI of 18.5 to 24.9 is normal. ? A BMI of 25 to 29.9 is considered overweight. ? A BMI of 30 and above is considered obese.   . Maintain normal blood lipids  and cholesterol levels by exercising and minimizing your intake of saturated fat. Eat a balanced diet with plenty of fruit and vegetables. Blood tests for lipids and cholesterol should begin at age 76 and be repeated every 5 years. If your lipid or cholesterol levels are high, you are over 50, or you are at high risk for heart disease, you may need your cholesterol levels checked more frequently. Ongoing high lipid and cholesterol levels should be treated with medicines if diet and exercise are not working.  . If you smoke, find out from your health care provider how to quit. If you do not use tobacco,  please do not start.  . If you choose to drink alcohol, please do not consume more than 2 drinks per day. One drink is considered to be 12 ounces (355 mL) of beer, 5 ounces (148 mL) of wine, or 1.5 ounces (44 mL) of liquor.  . If you are 24-72 years old, ask your health care provider if you should take aspirin to prevent strokes.  . Use sunscreen. Apply sunscreen liberally and repeatedly throughout the day. You should seek shade when your shadow is shorter than you. Protect yourself by wearing long sleeves, pants, a wide-brimmed hat, and sunglasses year round, whenever you are outdoors.  . Once a month, do a whole body skin exam, using a mirror to look at the skin on your back. Tell your health care provider of new moles, moles that have irregular borders, moles that are larger than a pencil eraser, or moles that have changed in shape or color.

## 2016-11-12 NOTE — Progress Notes (Signed)
I reviewed health advisor's note, was available for consultation, and agree with documentation and plan.  

## 2016-11-12 NOTE — Progress Notes (Signed)
PCP notes:   Health maintenance:  Flu vaccine - postponed/pt has sinus and congestion today Mammogram - postponed/pt plans to schedule future appt  Abnormal screenings:   Fall risk - hx of multiple falls with injury  Patient concerns:   Bilateral knee pain. Pain scale: 3/10.  Nurse concerns:  None  Next PCP appt:   11/21/16 @ 1400

## 2016-11-12 NOTE — Progress Notes (Signed)
Pre visit review using our clinic review tool, if applicable. No additional management support is needed unless otherwise documented below in the visit note. 

## 2016-11-14 DIAGNOSIS — R262 Difficulty in walking, not elsewhere classified: Secondary | ICD-10-CM | POA: Diagnosis not present

## 2016-11-14 DIAGNOSIS — M25562 Pain in left knee: Secondary | ICD-10-CM | POA: Diagnosis not present

## 2016-11-14 DIAGNOSIS — M25872 Other specified joint disorders, left ankle and foot: Secondary | ICD-10-CM | POA: Diagnosis not present

## 2016-11-19 DIAGNOSIS — M25562 Pain in left knee: Secondary | ICD-10-CM | POA: Diagnosis not present

## 2016-11-19 DIAGNOSIS — R262 Difficulty in walking, not elsewhere classified: Secondary | ICD-10-CM | POA: Diagnosis not present

## 2016-11-19 DIAGNOSIS — M25872 Other specified joint disorders, left ankle and foot: Secondary | ICD-10-CM | POA: Diagnosis not present

## 2016-11-21 ENCOUNTER — Encounter: Payer: Medicare HMO | Admitting: Family Medicine

## 2016-11-26 DIAGNOSIS — M25561 Pain in right knee: Secondary | ICD-10-CM | POA: Diagnosis not present

## 2016-11-26 DIAGNOSIS — M1711 Unilateral primary osteoarthritis, right knee: Secondary | ICD-10-CM | POA: Diagnosis not present

## 2016-11-26 DIAGNOSIS — M6281 Muscle weakness (generalized): Secondary | ICD-10-CM | POA: Diagnosis not present

## 2016-11-27 DIAGNOSIS — M6281 Muscle weakness (generalized): Secondary | ICD-10-CM | POA: Diagnosis not present

## 2016-11-27 DIAGNOSIS — M25872 Other specified joint disorders, left ankle and foot: Secondary | ICD-10-CM | POA: Diagnosis not present

## 2016-11-27 DIAGNOSIS — M25572 Pain in left ankle and joints of left foot: Secondary | ICD-10-CM | POA: Diagnosis not present

## 2016-11-27 DIAGNOSIS — M25561 Pain in right knee: Secondary | ICD-10-CM | POA: Diagnosis not present

## 2016-11-27 DIAGNOSIS — R262 Difficulty in walking, not elsewhere classified: Secondary | ICD-10-CM | POA: Diagnosis not present

## 2016-11-27 DIAGNOSIS — M1711 Unilateral primary osteoarthritis, right knee: Secondary | ICD-10-CM | POA: Diagnosis not present

## 2016-12-05 ENCOUNTER — Other Ambulatory Visit: Payer: Self-pay

## 2016-12-05 ENCOUNTER — Ambulatory Visit (INDEPENDENT_AMBULATORY_CARE_PROVIDER_SITE_OTHER): Payer: Medicare HMO | Admitting: Family Medicine

## 2016-12-05 ENCOUNTER — Encounter: Payer: Self-pay | Admitting: Family Medicine

## 2016-12-05 VITALS — BP 110/60 | HR 61 | Temp 97.7°F | Ht 61.0 in | Wt 155.0 lb

## 2016-12-05 DIAGNOSIS — Z23 Encounter for immunization: Secondary | ICD-10-CM

## 2016-12-05 DIAGNOSIS — I10 Essential (primary) hypertension: Secondary | ICD-10-CM

## 2016-12-05 DIAGNOSIS — G8929 Other chronic pain: Secondary | ICD-10-CM

## 2016-12-05 DIAGNOSIS — E1140 Type 2 diabetes mellitus with diabetic neuropathy, unspecified: Secondary | ICD-10-CM

## 2016-12-05 DIAGNOSIS — M25561 Pain in right knee: Secondary | ICD-10-CM | POA: Diagnosis not present

## 2016-12-05 DIAGNOSIS — E7800 Pure hypercholesterolemia, unspecified: Secondary | ICD-10-CM

## 2016-12-05 DIAGNOSIS — Z0000 Encounter for general adult medical examination without abnormal findings: Secondary | ICD-10-CM

## 2016-12-05 DIAGNOSIS — E1142 Type 2 diabetes mellitus with diabetic polyneuropathy: Secondary | ICD-10-CM

## 2016-12-05 LAB — HM DIABETES FOOT EXAM

## 2016-12-05 NOTE — Patient Instructions (Addendum)
Increase potassium in diet or add multivitamin.  Call to set up mammogram on your own.  Work on weight loss and regular exercise as able.   Hypokalemia Hypokalemia means that the amount of potassium in the blood is lower than normal.Potassium is a chemical that helps regulate the amount of fluid in the body (electrolyte). It also stimulates muscle tightening (contraction) and helps nerves work properly.Normally, most of the body's potassium is inside of cells, and only a very small amount is in the blood. Because the amount in the blood is so small, minor changes to potassium levels in the blood can be life-threatening. What are the causes? This condition may be caused by:  Antibiotic medicine.  Diarrhea or vomiting. Taking too much of a medicine that helps you have a bowel movement (laxative) can cause diarrhea and lead to hypokalemia.  Chronic kidney disease (CKD).  Medicines that help the body get rid of excess fluid (diuretics).  Eating disorders, such as bulimia.  Low magnesium levels in the body.  Sweating a lot.  What are the signs or symptoms? Symptoms of this condition include:  Weakness.  Constipation.  Fatigue.  Muscle cramps.  Mental confusion.  Skipped heartbeats or irregular heartbeat (palpitations).  Tingling or numbness.  How is this diagnosed? This condition is diagnosed with a blood test. How is this treated? Hypokalemia can be treated by taking potassium supplements by mouth or adjusting the medicines that you take. Treatment may also include eating more foods that contain a lot of potassium. If your potassium level is very low, you may need to get potassium through an IV tube in one of your veins and be monitored in the hospital. Follow these instructions at home:  Take over-the-counter and prescription medicines only as told by your health care provider. This includes vitamins and supplements.  Eat a healthy diet. A healthy diet includes fresh  fruits and vegetables, whole grains, healthy fats, and lean proteins.  If instructed, eat more foods that contain a lot of potassium, such as: ? Nuts, such as peanuts and pistachios. ? Seeds, such as sunflower seeds and pumpkin seeds. ? Peas, lentils, and lima beans. ? Whole grain and bran cereals and breads. ? Fresh fruits and vegetables, such as apricots, avocado, bananas, cantaloupe, kiwi, oranges, tomatoes, asparagus, and potatoes. ? Orange juice. ? Tomato juice. ? Red meats. ? Yogurt.  Keep all follow-up visits as told by your health care provider. This is important. Contact a health care provider if:  You have weakness that gets worse.  You feel your heart pounding or racing.  You vomit.  You have diarrhea.  You have diabetes (diabetes mellitus) and you have trouble keeping your blood sugar (glucose) in your target range. Get help right away if:  You have chest pain.  You have shortness of breath.  You have vomiting or diarrhea that lasts for more than 2 days.  You faint. This information is not intended to replace advice given to you by your health care provider. Make sure you discuss any questions you have with your health care provider. Document Released: 01/07/2005 Document Revised: 08/26/2015 Document Reviewed: 08/26/2015 Elsevier Interactive Patient Education  2018 [employer].

## 2016-12-05 NOTE — Assessment & Plan Note (Signed)
Stable control with diet Encouraged exercise, weight loss, healthy eating habits.

## 2016-12-05 NOTE — Progress Notes (Signed)
Subjective:    Patient ID: Ashley Ryan, female    DOB: [birth year], 71 y.o.   MRN: [id number]  HPI  The patient presents for complete physical and review of chronic health problems. He/She also has the following acute concerns today: left foot pain, right knee pain... Seeing ORTHO for these.. As follow up tommorow.  In middle of PT course.  The patient saw Candis Musa, LPN for medicare wellness. Note reviewed in detail and important notes copied below. Health maintenance: Flu vaccine - postponed/pt has sinus and congestion today Mammogram - postponed/pt plans to schedule future appt Abnormal screenings:  Fall risk - hx of multiple falls with injury Patient concerns:  Bilateral knee pain. Pain scale: 3/10.   12/05/16 Today  Diabetes:   Good control  On no med Lab Results  Component Value Date   HGBA1C 6.0 11/12/2016  Using medications without difficulties: Hypoglycemic episodes: Hyperglycemic episodes: Feet problems: neuropathy using 1 in AM and 1 at supper and 3 at night Blood Sugars averaging: not checking eye exam within last year:01/2016  Hypertension:   good control on amlodipine and atenolol BP Readings from Last 3 Encounters:  12/05/16 110/60  11/12/16 118/70  09/25/16 130/72  Using medication without problems or lightheadedness:  none Chest pain with exertion: none Edema:mild Short of breath:none Average home BPs: Other issues:  Elevated Cholesterol:  At goal on atorvastatin. Lab Results  Component Value Date   CHOL 129 11/12/2016   HDL 47.30 11/12/2016   LDLCALC 62 11/12/2016   LDLDIRECT 102.5 07/21/2009   TRIG 101.0 11/12/2016   CHOLHDL 3 11/12/2016  Using medications without problems: none Muscle aches: none Diet compliance: moderate Exercise: none given foot and knee pain Other complaints:   5 falls in last year.. She feels due to right knee pain, due to OA.Marland Kitchen Seeing  ORTHO.  Wt Readings from Last 3 Encounters:  12/05/16 155 lb  (70.3 kg)  11/12/16 155 lb (70.3 kg)  09/25/16 159 lb 8 oz (72.3 kg)   Body mass index is 29.29 kg/m.    Social History /Family History/Past Medical History reviewed in detail and updated in EMR if needed. Blood pressure 110/60, pulse 61, temperature 97.7 F (36.5 C), temperature source Oral, height 5\' 1"  (1.549 m), weight 155 lb (70.3 kg).  Review of Systems  Constitutional: Negative for fatigue and fever.  HENT: Negative for congestion.   Eyes: Negative for pain.  Respiratory: Negative for cough and shortness of breath.   Cardiovascular: Negative for chest pain, palpitations and leg swelling.  Gastrointestinal: Negative for abdominal pain.  Genitourinary: Negative for dysuria and vaginal bleeding.  Musculoskeletal: Negative for back pain.  Neurological: Negative for syncope, light-headedness and headaches.  Psychiatric/Behavioral: Negative for dysphoric mood.       Objective:   Physical Exam  Constitutional: Vital signs are normal. She appears well-developed and well-nourished. She is cooperative.  Non-toxic appearance. She does not appear ill. No distress.  obese  HENT:  Head: Normocephalic.  Right Ear: Hearing, tympanic membrane, external ear and ear canal normal. Tympanic membrane is not erythematous, not retracted and not bulging.  Left Ear: Hearing, tympanic membrane, external ear and ear canal normal. Tympanic membrane is not erythematous, not retracted and not bulging.  Nose: No mucosal edema or rhinorrhea. Right sinus exhibits no maxillary sinus tenderness and no frontal sinus tenderness. Left sinus exhibits no maxillary sinus tenderness and no frontal sinus tenderness.  Mouth/Throat: Uvula is midline, oropharynx is clear and moist and mucous membranes are  normal.  Eyes: Conjunctivae, EOM and lids are normal. Pupils are equal, round, and reactive to light. Lids are everted and swept, no foreign bodies found.  Neck: Trachea normal and normal range of motion. Neck  supple. Carotid bruit is not present. No thyroid mass and no thyromegaly present.  Cardiovascular: Normal rate, regular rhythm, S1 normal, S2 normal, normal heart sounds, intact distal pulses and normal pulses. Exam reveals no gallop and no friction rub.  No murmur heard. Mild bilateral edema, bilateral varicose viens  Pulmonary/Chest: Effort normal and breath sounds normal. No tachypnea. No respiratory distress. She has no decreased breath sounds. She has no wheezes. She has no rhonchi. She has no rales.  Abdominal: Soft. Normal appearance and bowel sounds are normal. There is no tenderness.  Neurological: She is alert.  Skin: Skin is warm, dry and intact. No rash noted.  Psychiatric: Her speech is normal and behavior is normal. Judgment and thought content normal. Her mood appears not anxious. Cognition and memory are normal. She does not exhibit a depressed mood.        Diabetic foot exam: Normal inspection No skin breakdown mild calluses  Normal DP pulses  No sensation to light touch and monofilament Nails normal  Assessment & Plan:  The patient's preventative maintenance and recommended screening tests for an annual wellness exam were reviewed in full today. Brought up to date unless services declined.  Counselled on the importance of diet, exercise, and its role in overall health and mortality. The patient's FH and SH was reviewed, including their home life, tobacco status, and drug and alcohol status.   Vaccines: given flu today DEXA: osteoporosis 04/2012, off fosamax, stopped for tounge soreness.  Has stomach ulcer 11/2103 on PPI nexium. 11/2014:  osteoprosis Started Boniva as of 10/2015.   11/2016 She has not been taking it and not sure why. lan repeat DEXA in 1-2 years. Colon: 08/2013, Dr. Radford Pax, repeat 5 year. Mammo: nml 06/2014, sister breast cancer age 28. Plans yearly. PAP/DVE: partial hysterectomy, no pap smear or DVE required,. No family history of ovarian  cancer.  Hep C: neg

## 2016-12-06 DIAGNOSIS — M19072 Primary osteoarthritis, left ankle and foot: Secondary | ICD-10-CM | POA: Diagnosis not present

## 2016-12-06 DIAGNOSIS — M1711 Unilateral primary osteoarthritis, right knee: Secondary | ICD-10-CM | POA: Diagnosis not present

## 2016-12-06 DIAGNOSIS — Q667 Congenital pes cavus: Secondary | ICD-10-CM | POA: Diagnosis not present

## 2016-12-10 DIAGNOSIS — M65872 Other synovitis and tenosynovitis, left ankle and foot: Secondary | ICD-10-CM | POA: Diagnosis not present

## 2016-12-10 DIAGNOSIS — M19072 Primary osteoarthritis, left ankle and foot: Secondary | ICD-10-CM | POA: Diagnosis not present

## 2016-12-10 DIAGNOSIS — Q667 Congenital pes cavus: Secondary | ICD-10-CM | POA: Diagnosis not present

## 2016-12-10 DIAGNOSIS — E1142 Type 2 diabetes mellitus with diabetic polyneuropathy: Secondary | ICD-10-CM | POA: Diagnosis not present

## 2016-12-17 ENCOUNTER — Other Ambulatory Visit: Payer: Self-pay | Admitting: Family Medicine

## 2016-12-17 NOTE — Telephone Encounter (Signed)
Last office visit 12/05/2016.  Last refilled 08/20/2016 for #90 with no refills.  Ok to refill?  Please print Rx to fax to mail order pharmacy.

## 2016-12-17 NOTE — Telephone Encounter (Signed)
Tramadol Rx faxed to [facility] [phone].

## 2016-12-18 ENCOUNTER — Other Ambulatory Visit: Payer: Self-pay | Admitting: *Deleted

## 2016-12-18 MED ORDER — FLUTICASONE PROPIONATE 50 MCG/ACT NA SUSP: NASAL | 3 refills | Status: DC

## 2016-12-23 DIAGNOSIS — M25561 Pain in right knee: Secondary | ICD-10-CM | POA: Insufficient documentation

## 2016-12-23 NOTE — Assessment & Plan Note (Addendum)
Due to OA. Followed by ortho. Continue PT.

## 2016-12-23 NOTE — Assessment & Plan Note (Signed)
At goal on atorvastatin.  

## 2016-12-23 NOTE — Assessment & Plan Note (Signed)
Tolerable control on gabapentin. Due to DM.

## 2017-01-23 DIAGNOSIS — M84374A Stress fracture, right foot, initial encounter for fracture: Secondary | ICD-10-CM | POA: Diagnosis not present

## 2017-02-01 ENCOUNTER — Other Ambulatory Visit: Payer: Self-pay

## 2017-02-01 ENCOUNTER — Encounter: Payer: Self-pay | Admitting: Emergency Medicine

## 2017-02-01 ENCOUNTER — Emergency Department
Admission: EM | Admit: 2017-02-01 | Discharge: 2017-02-01 | Disposition: A | Discharge status: Home / Self Care | Payer: Medicare HMO | Attending: Emergency Medicine | Admitting: Emergency Medicine

## 2017-02-01 ENCOUNTER — Emergency Department: Payer: Medicare HMO

## 2017-02-01 DIAGNOSIS — S99922A Unspecified injury of left foot, initial encounter: Secondary | ICD-10-CM | POA: Diagnosis present

## 2017-02-01 DIAGNOSIS — M25572 Pain in left ankle and joints of left foot: Secondary | ICD-10-CM | POA: Diagnosis not present

## 2017-02-01 DIAGNOSIS — Z79899 Other long term (current) drug therapy: Secondary | ICD-10-CM | POA: Insufficient documentation

## 2017-02-01 DIAGNOSIS — I10 Essential (primary) hypertension: Secondary | ICD-10-CM | POA: Diagnosis not present

## 2017-02-01 DIAGNOSIS — Y9389 Activity, other specified: Secondary | ICD-10-CM | POA: Insufficient documentation

## 2017-02-01 DIAGNOSIS — E1140 Type 2 diabetes mellitus with diabetic neuropathy, unspecified: Secondary | ICD-10-CM | POA: Diagnosis not present

## 2017-02-01 DIAGNOSIS — Y929 Unspecified place or not applicable: Secondary | ICD-10-CM | POA: Insufficient documentation

## 2017-02-01 DIAGNOSIS — S86002A Unspecified injury of left Achilles tendon, initial encounter: Secondary | ICD-10-CM | POA: Diagnosis not present

## 2017-02-01 DIAGNOSIS — Z7982 Long term (current) use of aspirin: Secondary | ICD-10-CM | POA: Diagnosis not present

## 2017-02-01 DIAGNOSIS — Y999 Unspecified external cause status: Secondary | ICD-10-CM | POA: Insufficient documentation

## 2017-02-01 DIAGNOSIS — X58XXXA Exposure to other specified factors, initial encounter: Secondary | ICD-10-CM | POA: Diagnosis not present

## 2017-02-01 DIAGNOSIS — R600 Localized edema: Secondary | ICD-10-CM | POA: Diagnosis not present

## 2017-02-01 NOTE — ED Notes (Signed)

## 2017-02-01 NOTE — Discharge Instructions (Signed)
Follow-up with Dr. Mack Guise, call on Monday morning for an appointment, the bones in your ankle and foot look normal, there is soft tissue thickening so we have concerns of a rupture either in the muscle or the tendon, this is only a partial rupture, if you are worsening we will need to return to the emergency department, elevate and apply ice

## 2017-02-01 NOTE — ED Triage Notes (Signed)
Pt has chronic left foot pain, states when she was going up the stairs, states she felt a pop or pull in her left foot around the heel, pt states swelling is present. Pt is able to bear minimal weight on left foot.  Pt is also in aircast on the right for the past 2 weeks. Pt sees Dr. Mack Guise and also [facility].

## 2017-02-01 NOTE — ED Provider Notes (Signed)
[facility] Emergency Department Provider Note  ____________________________________________   None    (approximate)  I have reviewed the triage vital signs and the nursing notes.   HISTORY  Chief Complaint Foot Pain    HPI AMAYIAH GOSNELL is a 72 y.o. female she states she was going up steps today she felt a pop in her left foot and then back around to the heel, she states her swelling in the foot and in the calf, she states she is able to slide around on her foot but not put a lot of weight on her foot, also her other foot is already in a boot for stress fracture, the patient denies any other injuries  Past Medical History:  Diagnosis Date  . Anemia   . Arthritis   . Chronic headache   . Complication of anesthesia    prior to 1991 used to have PONV.  none recently.  . Diverticulosis of colon   . GERD with stricture   . Heart murmur    followed by PCP  . Hiatal hernia   . Hypertension   . Neuropathy    feet  . Osteoporosis   . Pre-diabetes   . Wears dentures    full upper    Patient Active Problem List   Diagnosis Date Noted  . Right knee pain 12/23/2016  . Bilateral foot pain 05/21/2016  . Counseling regarding end of life decision making 11/10/2014  . Closed fracture of fourth metatarsal of right foot 11/01/2014  . 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 GERD (gastroesophageal reflux disease) 10/07/2013  . Stricture and stenosis of esophagus 10/07/2013  . Iron deficiency anemia 09/08/2013  . Osteoporosis 04/30/2012  . De Quervain's tenosynovitis, left 07/05/2011  . Diabetic peripheral neuropathy ([place]) 12/27/2009  . Osteoarthritis, bilateral knees 11/23/2009  . Controlled type 2 diabetes with neuropathy ([place]) 02/03/2009  . High cholesterol 02/03/2009  . COMMON MIGRAINE 11/03/2008  . ESSENTIAL HYPERTENSION, BENIGN 11/03/2008  . ALLERGIC RHINITIS DUE TO POLLEN 11/03/2008  . HIATAL HERNIA WITH REFLUX 11/03/2008  . DIVERTICULOSIS OF COLON  11/03/2008  . MURMUR 11/03/2008  . GASTRIC ULCER, HX OF 11/03/2008    Past Surgical History:  Procedure Laterality Date  . ABDOMINAL HYSTERECTOMY     one ovary remains  . CARDIAC CATHETERIZATION     "yrs ago" all OK.  Marland Kitchen CATARACT EXTRACTION W/PHACO Left 02/21/2016   Procedure: CATARACT EXTRACTION PHACO AND INTRAOCULAR LENS PLACEMENT ([place])  Left eye;  Surgeon: Leandrew Koyanagi, MD;  Location: [place];  Service: Ophthalmology;  Laterality: Left;  Left eye Diabetic  . CATARACT EXTRACTION W/PHACO Right 03/13/2016   Procedure: CATARACT EXTRACTION PHACO AND INTRAOCULAR LENS PLACEMENT ([place])  right  diabetic;  Surgeon: Leandrew Koyanagi, MD;  Location: [place];  Service: Ophthalmology;  Laterality: Right;  diabetic  . CHOLECYSTECTOMY      Prior to Admission medications   Medication Sig Start Date End Date Taking? Authorizing Provider  ACCU-CHEK SOFTCLIX LANCETS lancets Use to check blood sugar once daily 02/09/14   Bedsole, Amy E, MD  Alcohol Swabs (B-D SINGLE USE SWABS REGULAR) PADS Use to check blood sugar once daily 02/09/14   Bedsole, Amy E, MD  amLODipine (NORVASC) 5 MG tablet Take 1 tablet (5 mg total) by mouth daily. 11/21/15   Bedsole, Amy E, MD  aspirin EC 81 MG tablet Take 81 mg by mouth. 07/22/02   [provider]  atenolol (TENORMIN) 100 MG tablet TAKE 1 TABLET (100 MG TOTAL)  BY MOUTH DAILY. 11/21/15   Bedsole, Amy E, MD  atorvastatin (LIPITOR) 40 MG tablet Take 1 tablet (40 mg total) by mouth daily. 05/21/16   Bedsole, Amy E, MD  Blood Glucose Calibration (ACCU-CHEK AVIVA) SOLN Use to check blood sugar once daily 02/09/14   Bedsole, Amy E, MD  Blood Glucose Monitoring Suppl (ACCU-CHEK AVIVA PLUS) W/DEVICE KIT Use to check blood sugar once daily 02/09/14   Bedsole, Amy E, MD  Calcium Carbonate-Vitamin D (CALCIUM 600+D) 600-400 MG-UNIT per tablet Take 1 tablet by mouth daily.      [provider]  Calcium Polycarbophil (FIBER-CAPS PO) Take 1  capsule by mouth daily.    [provider]  esomeprazole (NEXIUM) 40 MG capsule TAKE 1 CAPSULE 2 (TWO) TIMES DAILY BEFORE A MEAL. 12/17/16   Bedsole, Amy E, MD  FLUoxetine (PROZAC) 20 MG capsule TAKE 1 CAPSULE EVERY DAY 12/17/16   Bedsole, Amy E, MD  fluticasone (FLONASE) 50 MCG/ACT nasal spray PLACE 2 SPRAYS INTO THE NOSE DAILY AS DIRECTED 12/18/16   Bedsole, Amy E, MD  gabapentin (NEURONTIN) 100 MG capsule TAKE 1 CAPSULE IN AM, 1 CAPSULE AT LUNCH, 1 CAPSULES AT DINNER AND 2-3 CAPSULES AT BEDTIME 11/21/15   Bedsole, Amy E, MD  Glucosamine 500 MG TABS Take 1 tablet by mouth daily.      [provider]  glucose blood (ACCU-CHEK AVIVA) test strip Use to check blood sugar once daily 02/09/14   Bedsole, Amy E, MD  HYDROcodone-acetaminophen (NORCO/VICODIN) 5-325 MG tablet Take 1 tablet by mouth 3 (three) times daily as needed for moderate pain. 11/01/14   Ria Bush, MD  ibandronate (BONIVA) 150 MG tablet Take 1 tablet (150 mg total) by mouth every 30 (thirty) days. Take in the morning with a full glass of water, on an empty stomach, and do not take anything else by mouth or lie down for the next 60 minutes. 11/24/15   Bedsole, Amy E, MD  latanoprost (XALATAN) 0.005 % ophthalmic solution PLACE 1 DROP EVRYDAY INTO BOTH EYES 10/05/14   [provider]  lidocaine (LIDODERM) 5 % PLACE 1 PATCH ONTO THE SKIN DAILY. REMOVE & DISCARD PATCH WITHIN 12 HOURS OR AS DIRECTED BY MD 08/20/16   [name], MD  Loratadine (CLARITIN) 10 MG CAPS Take 1 capsule by mouth as needed.      [provider]  losartan-hydrochlorothiazide (HYZAAR) 100-25 MG tablet TAKE 1 TABLET EVERY DAY 12/17/16   Bedsole, Amy E, MD  Omega-3 Fatty Acids (FISH OIL) 1000 MG CAPS Take 1 capsule by mouth daily.    [provider]  Pseudoephedrine HCl (SUDAFED PO) Take by mouth as needed.    [provider]  traMADol (ULTRAM) 50 MG tablet TAKE 1 TABLET EVERY 6 HOURS AS NEEDED 12/17/16    Bedsole, Amy E, MD  vitamin B-12 (CYANOCOBALAMIN) 1000 MCG tablet Take 1,000 mcg by mouth daily.    [provider]    Allergies Sulfa antibiotics and Sulfonamide derivatives  Family History  Problem Relation Age of Onset  . Breast cancer Unknown   . Stomach cancer Unknown   . Diabetes Unknown   . Heart disease Unknown   . Breast cancer Sister 54    Social History Social History   Tobacco Use  . Smoking status: Never Smoker  . Smokeless tobacco: Never Used  Substance Use Topics  . Alcohol use: No  . Drug use: No    Review of Systems  Constitutional: No fever/chills Eyes: No visual  changes. ENT: No sore throat. Respiratory: Denies cough Genitourinary: Negative for dysuria. Musculoskeletal: Negative for back pain.  Positive for left foot and ankle pain Skin: Negative for rash.    ____________________________________________   PHYSICAL EXAM:  VITAL SIGNS: ED Triage Vitals  Enc Vitals Group     BP 02/01/17 1556 (!) 144/65     Pulse Rate 02/01/17 1556 69     Resp 02/01/17 1556 18     Temp 02/01/17 1556 97.7 F (36.5 C)     Temp Source 02/01/17 1556 Oral     SpO2 02/01/17 1556 98 %     Weight 02/01/17 1556 155 lb (70.3 kg)     Height 02/01/17 1556 5' (1.524 m)     Head Circumference --      [place] --      Pain Score 02/01/17 1604 3     Pain Loc --      Pain Edu? --      Excl. in [place]? --     Constitutional: Alert and oriented. Well appearing and in no acute distress. Eyes: Conjunctivae are normal.  Head: Atraumatic. Nose: No congestion/rhinnorhea. Mouth/Throat: Mucous membranes are moist.   Cardiovascular: Normal rate, regular rhythm.  Heart sounds are normal Respiratory: Normal respiratory effort.  No retractions, lungs clear to auscultation GU: deferred Musculoskeletal: Left ankle is swollen at the lateral malleolus and towards the Achilles, the left calf has swelling in the calf typical of a tendon or muscle tear, the patient can point  her toes, but it reproduces pain, neurovascular is intact Neurologic:  Normal speech and language.  Skin:  Skin is warm, dry and intact. No rash noted. Psychiatric: Mood and affect are normal. Speech and behavior are normal.  ____________________________________________   LABS (all labs ordered are listed, but only abnormal results are displayed)  Labs Reviewed - No data to display ____________________________________________   ____________________________________________  RADIOLOGY  X-ray of the left ankle is negative for bony fracture, there is soft tissue swelling  ____________________________________________   PROCEDURES  Procedure(s) performed:   .Splint Application Date/Time: 7/61/6073 6:06 PM Performed by: Versie Starks, PA-C Authorized by: Versie Starks, PA-C   Consent:    Consent obtained:  Verbal and written   Consent given by:  Patient   Risks discussed:  Discoloration, numbness, pain and swelling   Alternatives discussed:  No treatment Pre-procedure details:    Sensation:  Normal Procedure details:    Laterality:  Left   Location:  Ankle   Supplies:  Prefabricated splint Post-procedure details:    Pain:  Unchanged   Sensation:  Normal   Patient tolerance of procedure:  Tolerated well, no immediate complications Comments:     Discuss the splint choice with the patient, ideally she would be in a long-leg cast, but due to the splint on the right leg we feel it is appropriate to put her in the stirrup splint and Ace wrap and limit her weightbearing, she has a walker at home that she will use      ____________________________________________   INITIAL IMPRESSION / ASSESSMENT AND PLAN / ED COURSE  Pertinent labs & imaging results that were available during my care of the patient were reviewed by me and considered in my medical decision making (see chart for details).  Patient is 72 year old female who comes to the ED today complaining of left  foot and ankle pain, states she was going up a step and felt a pop, the foot is swollen and  now she is noticed that part of the calf is swollen, she states she is able to bear weight on it but it is difficult, the right foot is in a boot due to a stress fracture  On physical exam the left ankle is swollen, there is swelling in the left calf typical of a muscle or tendon rupture  X-ray of the left ankle was ordered  ----------------------------------------- 6:08 PM on 02/01/2017 -----------------------------------------  X-ray of the ankle is negative, there is soft tissue swelling, due to the concerns that there is a partial rupture of the Achilles tendon or questionable muscle tear in the calf explained to the patient that we would ideally put her in a long-leg cast, but due to the patient's splint on the other leg this would incapacitate her, she states she would rather be in the stirrup splint with an Ace wrap and limit her weightbearing while at home, she states she has another Aircast boot at home, I instructed her to use that if she would like, she is to use her walker at home and limit her weightbearing on the left leg, as the stress fracture in the right foot should be partially healed, she states she understands will comply with recommendations, she was discharged in stable condition     As part of my medical decision making, I reviewed the following data within the [place] reviewed , Notes from prior ED visits and [place] Controlled Substance Database  ____________________________________________   FINAL CLINICAL IMPRESSION(S) / ED DIAGNOSES  Final diagnoses:  Achilles tendon injury, left, initial encounter      NEW MEDICATIONS STARTED DURING THIS VISIT:  New Prescriptions   No medications on file     Note:  This document was prepared using Dragon voice recognition software and may include unintentional dictation errors.    Versie Starks,  PA-C 02/01/17 [place], [place], MD 02/01/17 2043

## 2017-02-03 DIAGNOSIS — S86012A Strain of left Achilles tendon, initial encounter: Secondary | ICD-10-CM | POA: Diagnosis not present

## 2017-02-04 DIAGNOSIS — S86112D Strain of other muscle(s) and tendon(s) of posterior muscle group at lower leg level, left leg, subsequent encounter: Secondary | ICD-10-CM | POA: Diagnosis not present

## 2017-02-10 ENCOUNTER — Ambulatory Visit (INDEPENDENT_AMBULATORY_CARE_PROVIDER_SITE_OTHER): Payer: Medicare HMO | Admitting: Family Medicine

## 2017-02-10 ENCOUNTER — Encounter: Payer: Self-pay | Admitting: Family Medicine

## 2017-02-10 DIAGNOSIS — M79673 Pain in unspecified foot: Secondary | ICD-10-CM | POA: Diagnosis not present

## 2017-02-10 NOTE — Patient Instructions (Addendum)
Get back in the boot and use that for left foot/ankle pain.  You likely strained either your calf and/or your achilles.   This is likely a separate issue from the neuropathy.   Wear the boot when weight bearing until the pain is clearly better in your foot.  At that point, gradually increase the amount of time out of the boot.    Update [place] as needed.  Take care.  Glad to see you.

## 2017-02-10 NOTE — Progress Notes (Signed)
L foot pain.  H/o neuropathy at baseline.  She had recently pulled her L calf muscle/achilles with eval at outside clinic. She was going up steps and felt a pop at the time.  She got a brace for that in the meantime.  It was puffy earlier today but not now.    Her R foot is not in a boot now.  She is back in tennis shoes.    She has been taking 5 gabapentin per day, at baseline.    Prev imaging with  1. No fracture. 2. Stable thickened appearance of the Achilles tendon. 3. Diffuse soft tissue edema without significant change.  Meds, vitals, and allergies reviewed.   ROS: Per HPI unless specifically indicated in ROS section   nad Able to bear weight L foot with normal inspection.   L achilles ttp distally w/o bruising or swelling.   No sensation to monofilament on L foot.  High arch noted DP pulse intact.   Midfoot not ttp Able to bear weight.

## 2017-02-11 DIAGNOSIS — M79673 Pain in unspecified foot: Secondary | ICD-10-CM | POA: Insufficient documentation

## 2017-02-11 DIAGNOSIS — M79675 Pain in left toe(s): Secondary | ICD-10-CM | POA: Insufficient documentation

## 2017-02-11 NOTE — Assessment & Plan Note (Signed)
Likely a combination of neuropathy and calf/Achilles pathology.  She likely has some pain in the foot partially controlled by gabapentin, with decrease in sensation, both related to neuropathy.  She likely has irritation or potential partial tear in her calf/Achilles.  No signs of a complete tear.  Is able to plantar flex appropriately.  Discussed with patient about options.  She is no longer using a boot on the right foot.  She could use that boot to immobilize her left ankle.  This is likely the best option for the Achilles strain.  Routine cautions given.  She can gradually wean out of the boot with weightbearing as her condition improves.  Update [place] as needed. She agrees.

## 2017-02-18 ENCOUNTER — Other Ambulatory Visit: Payer: Self-pay | Admitting: Family Medicine

## 2017-02-19 NOTE — Telephone Encounter (Signed)
Last office visit 02/10/2017 with Dr. Damita Dunnings.  Last refilled 08/20/2016 for #90 with no refills.  Ok to refill?

## 2017-02-24 ENCOUNTER — Encounter: Payer: Self-pay | Admitting: Family Medicine

## 2017-02-24 ENCOUNTER — Ambulatory Visit (INDEPENDENT_AMBULATORY_CARE_PROVIDER_SITE_OTHER): Payer: Medicare HMO | Admitting: Family Medicine

## 2017-02-24 ENCOUNTER — Other Ambulatory Visit: Payer: Self-pay

## 2017-02-24 ENCOUNTER — Ambulatory Visit
Admission: RE | Admit: 2017-02-24 | Discharge: 2017-02-24 | Disposition: A | Discharge status: Home / Self Care | Payer: Medicare HMO | Source: Ambulatory Visit | Attending: Family Medicine | Admitting: Family Medicine

## 2017-02-24 VITALS — BP 158/80 | HR 63 | Temp 97.6°F | Ht 61.0 in | Wt 160.2 lb

## 2017-02-24 DIAGNOSIS — M7989 Other specified soft tissue disorders: Secondary | ICD-10-CM

## 2017-02-24 DIAGNOSIS — M79662 Pain in left lower leg: Secondary | ICD-10-CM

## 2017-02-24 DIAGNOSIS — M79672 Pain in left foot: Secondary | ICD-10-CM | POA: Diagnosis not present

## 2017-02-24 NOTE — Progress Notes (Signed)
Dr. Frederico Hamman T. Silvie Obremski, MD, [facility] [place], [address] Phone: [phone] Fax: [phone]  02/24/2017  Patient: Ashley Ryan, MRN: [id number], DOB: 1945/06/22, 72 y.o.  Primary Physician:  [name], MD   Chief Complaint  Patient presents with  . Foot Pain    Left  . Foot Swelling    Left   Subjective:   Ashley Ryan is a 72 y.o. very pleasant female patient who presents with the following:  L leg swelling and pain, acute swelling LLE  Pleasant lady who I have known for a long time who presents after having a number of musculoskeletal issues over the last few months.  Most recently she had a stress fracture of a metatarsal in her right foot, and is currently not having pain in her right foot.  Now acutely over the last 2 days she is having pain in her leg on the left lower extremity as well as significant new swelling in her left foot and ankle.  She went to the ER on February 01, 2017, and at that point they thought she had a partial Achilles injury versus a partial calf injury and she is been in a full length Cam Walker boot since that time.  She saw my partner a little bit over a week after this.  Past Medical History, Surgical History, Social History, Family History, Problem List, Medications, and Allergies have been reviewed and updated if relevant.  Patient Active Problem List   Diagnosis Date Noted  . Foot pain 02/11/2017  . Right knee pain 12/23/2016  . Bilateral foot pain 05/21/2016  . Counseling regarding end of life decision making 11/10/2014  . Closed fracture of fourth metatarsal of right foot 11/01/2014  . 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 GERD (gastroesophageal reflux disease) 10/07/2013  . Stricture and stenosis of esophagus 10/07/2013  . Iron deficiency anemia 09/08/2013  . Osteoporosis 04/30/2012  . De Quervain's tenosynovitis, left 07/05/2011  . Diabetic peripheral neuropathy ([place])  12/27/2009  . Osteoarthritis, bilateral knees 11/23/2009  . Controlled type 2 diabetes with neuropathy ([place]) 02/03/2009  . High cholesterol 02/03/2009  . COMMON MIGRAINE 11/03/2008  . ESSENTIAL HYPERTENSION, BENIGN 11/03/2008  . ALLERGIC RHINITIS DUE TO POLLEN 11/03/2008  . HIATAL HERNIA WITH REFLUX 11/03/2008  . DIVERTICULOSIS OF COLON 11/03/2008  . MURMUR 11/03/2008  . GASTRIC ULCER, HX OF 11/03/2008    Past Medical History:  Diagnosis Date  . Anemia   . Arthritis   . Chronic headache   . Complication of anesthesia    prior to 1991 used to have PONV.  none recently.  . Diverticulosis of colon   . GERD with stricture   . Heart murmur    followed by PCP  . Hiatal hernia   . Hypertension   . Neuropathy    feet  . Osteoporosis   . Pre-diabetes   . Wears dentures    full upper    Past Surgical History:  Procedure Laterality Date  . ABDOMINAL HYSTERECTOMY     one ovary remains  . CARDIAC CATHETERIZATION     "yrs ago" all OK.  [facility] CATARACT EXTRACTION W/PHACO Left 02/21/2016   Procedure: CATARACT EXTRACTION PHACO AND INTRAOCULAR LENS PLACEMENT ([place])  Left eye;  Surgeon: Leandrew Koyanagi, MD;  Location: [facility];  Service: Ophthalmology;  Laterality: Left;  Left eye Diabetic  . CATARACT EXTRACTION W/PHACO Right 03/13/2016   Procedure: CATARACT EXTRACTION PHACO AND INTRAOCULAR  LENS PLACEMENT ([facility])  right  diabetic;  Surgeon: Leandrew Koyanagi, MD;  Location: [facility];  Service: Ophthalmology;  Laterality: Right;  diabetic  . CHOLECYSTECTOMY      Social History   Socioeconomic History  . Marital status: Married    Spouse name: Not on file  . Number of children: 2  . Years of education: Not on file  . Highest education level: Not on file  Social Needs  . Financial resource strain: Not on file  . Food insecurity - worry: Not on file  . Food insecurity - inability: Not on file  . Transportation needs - medical: Not on file  . Transportation  needs - non-medical: Not on file  Occupational History  . Occupation: retired Forensic psychologist: RETIRED  Tobacco Use  . Smoking status: Never Smoker  . Smokeless tobacco: Never Used  Substance and Sexual Activity  . Alcohol use: No  . Drug use: No  . Sexual activity: No  Other Topics Concern  . Not on file  Social History Narrative   Drinks sweet tea, diet sodas 4 a day   No living will, full code (reviewed 2014)    Family History  Problem Relation Age of Onset  . Breast cancer Unknown   . Stomach cancer Unknown   . Diabetes Unknown   . Heart disease Unknown   . Breast cancer Sister 37    Allergies  Allergen Reactions  . Sulfa Antibiotics Rash  . Sulfonamide Derivatives Rash    Medication list reviewed and updated in full in [place] [facility] [place].  GEN: No fevers, chills. Nontoxic. Primarily MSK c/o today. MSK: Detailed in the HPI GI: tolerating PO intake without difficulty Neuro: No numbness, parasthesias, or tingling associated. Otherwise the pertinent positives of the ROS are noted above.   Objective:   BP (!) 158/80   Pulse 63   Temp 97.6 F (36.4 C) (Oral)   Ht _0  (1.549 m)   Wt 160 lb 4 oz (72.7 kg)   BMI 30.28 kg/m    GEN: WDWN, NAD, Non-toxic, Alert & Oriented x 3 HEENT: Atraumatic, Normocephalic.  Ears and Nose: No external deformity. EXTR: No clubbing/cyanosis/edema - LLE is 3+ at foot NEURO: Normal gait.  antalgia PSYCH: Normally interactive. Conversant. Not depressed or anxious appearing.  Calm demeanor.    Ashley Ryan is positive on the left with notable pain in the proximal calf.  There is some mild swelling throughout the left lower extremity compared to the right.  In the foot there is market swelling, 3+ compared to the right side.  There is some minimal bony anatomy tenderness, around the talus region as well as the midfoot, but nothing gross.  Achilles function appears intact, the patient is able to plantar flex without  difficulty.  Radiology: Dg Ankle Complete Left  Result Date: 02/01/2017 CLINICAL DATA:  Posterior left ankle pain after feeling a pop in the posterior ankle while walking up stairs today. EXAM: LEFT ANKLE COMPLETE - 3+ VIEW COMPARISON:  09/25/2016. FINDINGS: Diffuse soft tissue swelling. Previously noted pes cavus and moderate-sized inferior calcaneal spur. Mild anterior talotibial spur formation. The Achilles tendon appears thickened, unchanged. No fracture, dislocation or effusion seen. IMPRESSION: 1. No fracture. 2. Stable thickened appearance of the Achilles tendon. 3. Diffuse soft tissue edema without significant change. Electronically Signed   By: Claudie Revering M.D.   On: 02/01/2017 17:27    Assessment and Plan:   Pain and swelling of left lower leg -  Plan: US Venous Img Lower Unilateral Left  Left foot pain - Plan: US Venous Img Lower Unilateral Left  Evaluate for potential DVT on the left side of lower extremity.  Treat as such if needed.  There is no full-thickness Achilles tear.  Function is intact.  No obvious fractures on prior films.  In case of possible partial injury noted by prior physicians, I am going to keep her in her cam walker boot for 3 more weeks and recheck in that time.  Orders Placed This Encounter  Procedures  . US Venous Img Lower Unilateral Left    Signed,  Gar Glance T. Stephon Weathers, MD   Allergies as of 02/24/2017      Reactions   Sulfa Antibiotics Rash   Sulfonamide Derivatives Rash      Medication List        Accurate as of 02/24/17  2:05 PM. Always use your most recent med list.          ACCU-CHEK AVIVA PLUS w/Device Kit Use to check blood sugar once daily   ACCU-CHEK AVIVA Soln Use to check blood sugar once daily   ACCU-CHEK SOFTCLIX LANCETS lancets Use to check blood sugar once daily   amLODipine 5 MG tablet Commonly known as:  NORVASC Take 1 tablet (5 mg total) by mouth daily.   aspirin EC 81 MG tablet Take 81 mg by mouth.   atenolol  100 MG tablet Commonly known as:  TENORMIN TAKE 1 TABLET (100 MG TOTAL) BY MOUTH DAILY.   atorvastatin 40 MG tablet Commonly known as:  LIPITOR Take 1 tablet (40 mg total) by mouth daily.   B-D SINGLE USE SWABS REGULAR Pads Use to check blood sugar once daily   CALCIUM 600+D 600-400 MG-UNIT tablet Generic drug:  Calcium Carbonate-Vitamin D Take 1 tablet by mouth daily.   CLARITIN 10 MG Caps Generic drug:  Loratadine Take 1 capsule by mouth as needed.   esomeprazole 40 MG capsule Commonly known as:  NEXIUM TAKE 1 CAPSULE 2 (TWO) TIMES DAILY BEFORE A MEAL.   FIBER-CAPS PO Take 1 capsule by mouth daily.   Fish Oil 1000 MG Caps Take 1 capsule by mouth daily.   FLUoxetine 20 MG capsule Commonly known as:  PROZAC TAKE 1 CAPSULE EVERY DAY   fluticasone 50 MCG/ACT nasal spray Commonly known as:  FLONASE PLACE 2 SPRAYS INTO THE NOSE DAILY AS DIRECTED   gabapentin 100 MG capsule Commonly known as:  NEURONTIN TAKE 1 CAPSULE IN AM, 1 CAPSULE AT LUNCH, 1 CAPSULES AT DINNER AND 2-3 CAPSULES AT BEDTIME   Glucosamine 500 MG Tabs Take 1 tablet by mouth daily.   glucose blood test strip Commonly known as:  ACCU-CHEK AVIVA Use to check blood sugar once daily   HYDROcodone-acetaminophen 5-325 MG tablet Commonly known as:  NORCO/VICODIN Take 1 tablet by mouth 3 (three) times daily as needed for moderate pain.   ibandronate 150 MG tablet Commonly known as:  BONIVA Take 1 tablet (150 mg total) by mouth every 30 (thirty) days. Take in the morning with a full glass of water, on an empty stomach, and do not take anything else by mouth or lie down for the next 60 minutes.   latanoprost 0.005 % ophthalmic solution Commonly known as:  XALATAN PLACE 1 DROP EVRYDAY INTO BOTH EYES   lidocaine 5 % Commonly known as:  LIDODERM APPLY 1 PATCH ONTO THE SKIN EVERY DAY. REMOVE AND DISCARD PATCH WITHIN 12 HOURS OR AS DIRECTED BY PHYSICIAN.  losartan-hydrochlorothiazide 100-25 MG  tablet Commonly known as:  HYZAAR TAKE 1 TABLET EVERY DAY   SUDAFED PO Take by mouth as needed.   traMADol 50 MG tablet Commonly known as:  ULTRAM TAKE 1 TABLET EVERY 6 HOURS AS NEEDED   vitamin B-12 1000 MCG tablet Commonly known as:  CYANOCOBALAMIN Take 1,000 mcg by mouth daily.

## 2017-02-25 ENCOUNTER — Telehealth: Payer: Self-pay

## 2017-02-25 NOTE — Telephone Encounter (Signed)
This Probation officer took the following call report from [employer] with Ultrasound at [facility]:  Lower Extremity doppler performed -  NEGATIVE FOR DVT.  I verbally notified Dr. Lorelei Pont of results, patient was free to go home.  Dr. Lorelei Pont states that he had already given patient follow up instructions and no

## 2017-03-01 ENCOUNTER — Other Ambulatory Visit: Payer: Self-pay | Admitting: Family Medicine

## 2017-03-04 DIAGNOSIS — R600 Localized edema: Secondary | ICD-10-CM | POA: Diagnosis not present

## 2017-03-04 DIAGNOSIS — S86112D Strain of other muscle(s) and tendon(s) of posterior muscle group at lower leg level, left leg, subsequent encounter: Secondary | ICD-10-CM | POA: Diagnosis not present

## 2017-03-04 DIAGNOSIS — M79672 Pain in left foot: Secondary | ICD-10-CM | POA: Diagnosis not present

## 2017-03-04 DIAGNOSIS — M7662 Achilles tendinitis, left leg: Secondary | ICD-10-CM | POA: Diagnosis not present

## 2017-03-18 ENCOUNTER — Ambulatory Visit (INDEPENDENT_AMBULATORY_CARE_PROVIDER_SITE_OTHER): Payer: Medicare HMO | Admitting: Family Medicine

## 2017-03-18 ENCOUNTER — Encounter: Payer: Self-pay | Admitting: Family Medicine

## 2017-03-18 ENCOUNTER — Other Ambulatory Visit: Payer: Self-pay

## 2017-03-18 VITALS — BP 120/80 | HR 62 | Temp 98.8°F | Ht 61.0 in | Wt 153.2 lb

## 2017-03-18 DIAGNOSIS — J111 Influenza due to unidentified influenza virus with other respiratory manifestations: Secondary | ICD-10-CM | POA: Diagnosis not present

## 2017-03-18 DIAGNOSIS — R509 Fever, unspecified: Secondary | ICD-10-CM | POA: Diagnosis not present

## 2017-03-18 LAB — POC INFLUENZA A&B (BINAX/QUICKVUE)
INFLUENZA B, POC: NEGATIVE
Influenza A, POC: POSITIVE — AB

## 2017-03-18 MED ORDER — OSELTAMIVIR PHOSPHATE 75 MG PO CAPS: 75.0000 mg | ORAL_CAPSULE | Freq: Two times a day (BID) | ORAL | 0 refills | Status: DC

## 2017-03-18 NOTE — Patient Instructions (Signed)
Mucinex DM twice daily.  Rest, fluids.  Complete course of tamiflu.   Influenza, Adult Influenza ("the flu") is an infection in the lungs, nose, and throat (respiratory tract). It is caused by a virus. The flu causes many common cold symptoms, as well as a high fever and body aches. It can make you feel very sick. The flu spreads easily from person to person (is contagious). Getting a flu shot (influenza vaccination) every year is the best way to prevent the flu. Follow these instructions at home:  Take over-the-counter and prescription medicines only as told by your doctor.  Use a cool mist humidifier to add moisture (humidity) to the air in your home. This can make it easier to breathe.  Rest as needed.  Drink enough fluid to keep your pee (urine) clear or pale yellow.  Cover your mouth and nose when you cough or sneeze.  Wash your hands with soap and water often, especially after you cough or sneeze. If you cannot use soap and water, use hand sanitizer.  Stay home from work or school as told by your doctor. Unless you are visiting your doctor, try to avoid leaving home until your fever has been gone for 24 hours without the use of medicine.  Keep all follow-up visits as told by your doctor. This is important. How is this prevented?  Getting a yearly (annual) flu shot is the best way to avoid getting the flu. You may get the flu shot in late summer, fall, or winter. Ask your doctor when you should get your flu shot.  Wash your hands often or use hand sanitizer often.  Avoid contact with people who are sick during cold and flu season.  Eat healthy foods.  Drink plenty of fluids.  Get enough sleep.  Exercise regularly. Contact a doctor if:  You get new symptoms.  You have: ? Chest pain. ? Watery poop (diarrhea). ? A fever.  Your cough gets worse.  You start to have more mucus.  You feel sick to your stomach (nauseous).  You throw up (vomit). Get help right  away if:  You start to be short of breath or have trouble breathing.  Your skin or nails turn a bluish color.  You have very bad pain or stiffness in your neck.  You get a sudden headache.  You get sudden pain in your face or ear.  You cannot stop throwing up. This information is not intended to replace advice given to you by your health care provider. Make sure you discuss any questions you have with your health care provider. Document Released: 10/17/2007 Document Revised: 06/15/2015 Document Reviewed: 11/01/2014 Elsevier Interactive Patient Education  2017 [employer].

## 2017-03-18 NOTE — Assessment & Plan Note (Signed)
Discussed symptomatic care.  Hydration, rest. Call if SOB, cough worsening or prolongued fever. Reviewed flu timeline. She has no health problems but does have a sick husband at risk for complications , so we decided to use of tamiflu. Pt agreed. Discussed family prophylaxis,  And sent in prophylaxis for husband. She was advised to not return to work until 24 hour after fever resolved on no antipyretics.

## 2017-03-18 NOTE — Progress Notes (Signed)
   Subjective:    Patient ID: Ashley Ryan, female    DOB: September 04, 1945, 72 y.o.   MRN: [id number]  Fever   The current episode started yesterday ( Started feeling bad 1 week ago with headaceh, congestion, now in last 24 hour fever 101F). The problem has been gradually worsening. The maximum temperature noted was 101 to 101.9 F. Associated symptoms include congestion, coughing and headaches. Pertinent negatives include no chest pain, ear pain, sore throat or wheezing. Associated symptoms comments: Cough, mild  sinus pressure, no ear pain. She has tried acetaminophen (Claritin and sudafed, flonase) for the symptoms. The treatment provided moderate relief.  Risk factors: sick contacts     Nonsmoker, no past hx of COPD, asthma  Blood pressure 120/80, pulse 62, temperature 98.8 F (37.1 C), temperature source Oral, height 5\' 1"  (1.549 m), weight 153 lb 4 oz (69.5 kg).  Review of Systems  Constitutional: Positive for fever.  HENT: Positive for congestion. Negative for ear pain and sore throat.   Respiratory: Positive for cough. Negative for wheezing.   Cardiovascular: Negative for chest pain.  Neurological: Positive for headaches.       Objective:   Physical Exam  Constitutional: Vital signs are normal. She appears well-developed and well-nourished. She is cooperative.  Non-toxic appearance. She appears ill. No distress.  HENT:  Head: Normocephalic.  Right Ear: Hearing, tympanic membrane, external ear and ear canal normal. Tympanic membrane is not erythematous, not retracted and not bulging.  Left Ear: Hearing, tympanic membrane, external ear and ear canal normal. Tympanic membrane is not erythematous, not retracted and not bulging.  Nose: Mucosal edema and rhinorrhea present. Right sinus exhibits no maxillary sinus tenderness and no frontal sinus tenderness. Left sinus exhibits no maxillary sinus tenderness and no frontal sinus tenderness.  Mouth/Throat: Uvula is midline, oropharynx is  clear and moist and mucous membranes are normal.  Eyes: Conjunctivae, EOM and lids are normal. Pupils are equal, round, and reactive to light. Lids are everted and swept, no foreign bodies found.  Neck: Trachea normal and normal range of motion. Neck supple. Carotid bruit is not present. No thyroid mass and no thyromegaly present.  Cardiovascular: Normal rate, regular rhythm, S1 normal, S2 normal, normal heart sounds, intact distal pulses and normal pulses. Exam reveals no gallop and no friction rub.  No murmur heard. Pulmonary/Chest: Effort normal and breath sounds normal. No tachypnea. No respiratory distress. She has no decreased breath sounds. She has no wheezes. She has no rhonchi. She has no rales.  Neurological: She is alert.  Skin: Skin is warm, dry and intact. No rash noted.  Psychiatric: Her speech is normal and behavior is normal. Judgment normal. Her mood appears not anxious. Cognition and memory are normal. She does not exhibit a depressed mood.          Assessment & Plan:

## 2017-03-19 ENCOUNTER — Other Ambulatory Visit: Payer: Self-pay | Admitting: Family Medicine

## 2017-03-19 NOTE — Telephone Encounter (Signed)
Last office visit 03/18/2017.  Last refilled Gabapentin: 11/21/2015 for #240 with 5 refills.  Tramadol: 12/17/16 for #90 with no refills.  Ok to refill?

## 2017-03-20 ENCOUNTER — Other Ambulatory Visit: Payer: Self-pay | Admitting: Family Medicine

## 2017-03-21 ENCOUNTER — Other Ambulatory Visit: Payer: Self-pay | Admitting: Family Medicine

## 2017-03-24 ENCOUNTER — Telehealth: Payer: Self-pay | Admitting: *Deleted

## 2017-03-24 ENCOUNTER — Ambulatory Visit: Payer: Medicare HMO | Admitting: Family Medicine

## 2017-03-24 NOTE — Telephone Encounter (Signed)
Received fax from [facility] requesting PA for Exomeprazole 40 mg.  PA completed on CoverMyMeds.  Sent to [facility] for review.  Can take up to 72 hours for a decision.

## 2017-03-25 NOTE — Telephone Encounter (Signed)
PA approved through 03/24/2018.  [facility] notified of approval via fax.

## 2017-04-01 DIAGNOSIS — S86112D Strain of other muscle(s) and tendon(s) of posterior muscle group at lower leg level, left leg, subsequent encounter: Secondary | ICD-10-CM | POA: Diagnosis not present

## 2017-04-01 DIAGNOSIS — M7662 Achilles tendinitis, left leg: Secondary | ICD-10-CM | POA: Diagnosis not present

## 2017-04-01 DIAGNOSIS — R600 Localized edema: Secondary | ICD-10-CM | POA: Diagnosis not present

## 2017-04-01 DIAGNOSIS — M79672 Pain in left foot: Secondary | ICD-10-CM | POA: Diagnosis not present

## 2017-04-08 DIAGNOSIS — H401132 Primary open-angle glaucoma, bilateral, moderate stage: Secondary | ICD-10-CM | POA: Diagnosis not present

## 2017-04-15 ENCOUNTER — Other Ambulatory Visit: Payer: Self-pay | Admitting: Family Medicine

## 2017-04-15 DIAGNOSIS — Z1231 Encounter for screening mammogram for malignant neoplasm of breast: Secondary | ICD-10-CM

## 2017-04-21 ENCOUNTER — Ambulatory Visit (INDEPENDENT_AMBULATORY_CARE_PROVIDER_SITE_OTHER): Payer: Medicare HMO | Admitting: Family Medicine

## 2017-04-21 ENCOUNTER — Encounter: Payer: Self-pay | Admitting: Family Medicine

## 2017-04-21 VITALS — BP 110/62 | HR 60 | Temp 97.8°F | Ht 61.0 in | Wt 155.5 lb

## 2017-04-21 DIAGNOSIS — M25561 Pain in right knee: Secondary | ICD-10-CM | POA: Diagnosis not present

## 2017-04-21 DIAGNOSIS — M1711 Unilateral primary osteoarthritis, right knee: Secondary | ICD-10-CM

## 2017-04-21 MED ORDER — METHYLPREDNISOLONE ACETATE 40 MG/ML IJ SUSP
40.0000 mg | Freq: Once | INTRAMUSCULAR | Status: AC
  Administered 2017-04-21: 40 mg via INTRAMUSCULAR

## 2017-04-21 NOTE — Progress Notes (Signed)
Dr. Frederico Hamman T. Avianna Moynahan, MD, [facility] [place], [address] Phone: [phone] Fax: [phone]  04/21/2017  Patient: Ashley Ryan, MRN: [id number], DOB: [birth year], 72 y.o.  Primary Physician:  [name], MD   Chief Complaint  Patient presents with  . Knee Pain    Right   Subjective:   Ashley Ryan is a 72 y.o. very pleasant female patient who presents with the following:  R knee is not feeling good. Husband has leukemia, had bypass and not dealing.  Still working CNA work.  Has been favoring the L leg.   Is been bothering her a lot more recently this spring, and she is having some stiffness with movement and getting up in the morning.  She has had a knee replacement on the left side.  At this point she is not ready for any kind of surgery on her right knee.  She is taking some tramadol as well as some glucosamine, Tylenol, as well as lidocaine patches topically.  Past Medical History, Surgical History, Social History, Family History, Problem List, Medications, and Allergies have been reviewed and updated if relevant.  Patient Active Problem List   Diagnosis Date Noted  . Influenza 03/18/2017  . Foot pain 02/11/2017  . Right knee pain 12/23/2016  . Bilateral foot pain 05/21/2016  . Counseling regarding end of life decision making 11/10/2014  . Closed fracture of fourth metatarsal of right foot 11/01/2014  . 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 GERD (gastroesophageal reflux disease) 10/07/2013  . Stricture and stenosis of esophagus 10/07/2013  . Iron deficiency anemia 09/08/2013  . Osteoporosis 04/30/2012  . De Quervain's tenosynovitis, left 07/05/2011  . Diabetic peripheral neuropathy ([place]) 12/27/2009  . Osteoarthritis, bilateral knees 11/23/2009  . Controlled type 2 diabetes with neuropathy ([place]) 02/03/2009  . High cholesterol 02/03/2009  . COMMON MIGRAINE 11/03/2008  . ESSENTIAL HYPERTENSION, BENIGN  11/03/2008  . ALLERGIC RHINITIS DUE TO POLLEN 11/03/2008  . HIATAL HERNIA WITH REFLUX 11/03/2008  . DIVERTICULOSIS OF COLON 11/03/2008  . MURMUR 11/03/2008  . GASTRIC ULCER, HX OF 11/03/2008    Past Medical History:  Diagnosis Date  . Anemia   . Arthritis   . Chronic headache   . Complication of anesthesia    prior to 1991 used to have PONV.  none recently.  . Diverticulosis of colon   . GERD with stricture   . Heart murmur    followed by PCP  . Hiatal hernia   . Hypertension   . Neuropathy    feet  . Osteoporosis   . Pre-diabetes   . Wears dentures    full upper    Past Surgical History:  Procedure Laterality Date  . ABDOMINAL HYSTERECTOMY     one ovary remains  . CARDIAC CATHETERIZATION     "yrs ago" all OK.  [facility] Kitchen CATARACT EXTRACTION W/PHACO Left 02/21/2016   Procedure: CATARACT EXTRACTION PHACO AND INTRAOCULAR LENS PLACEMENT ([place])  Left eye;  Surgeon: Leandrew Koyanagi, MD;  Location: [facility];  Service: Ophthalmology;  Laterality: Left;  Left eye Diabetic  . CATARACT EXTRACTION W/PHACO Right 03/13/2016   Procedure: CATARACT EXTRACTION PHACO AND INTRAOCULAR LENS PLACEMENT ([place])  right  diabetic;  Surgeon: Leandrew Koyanagi, MD;  Location: [facility];  Service: Ophthalmology;  Laterality: Right;  diabetic  . CHOLECYSTECTOMY      Social History   Socioeconomic History  . Marital status: Married    Spouse name: Not  on file  . Number of children: 2  . Years of education: Not on file  . Highest education level: Not on file  Occupational History  . Occupation: retired Forensic psychologist: RETIRED  Social Needs  . Financial resource strain: Not on file  . Food insecurity:    Worry: Not on file    Inability: Not on file  . Transportation needs:    Medical: Not on file    Non-medical: Not on file  Tobacco Use  . Smoking status: Never Smoker  . Smokeless tobacco: Never Used  Substance and Sexual Activity  . Alcohol use: No  . Drug use: No  .  Sexual activity: Never  Lifestyle  . Physical activity:    Days per week: Not on file    Minutes per session: Not on file  . Stress: Not on file  Relationships  . Social connections:    Talks on phone: Not on file    Gets together: Not on file    Attends religious service: Not on file    Active member of club or organization: Not on file    Attends meetings of clubs or organizations: Not on file    Relationship status: Not on file  . Intimate partner violence:    Fear of current or ex partner: Not on file    Emotionally abused: Not on file    Physically abused: Not on file    Forced sexual activity: Not on file  Other Topics Concern  . Not on file  Social History Narrative   Drinks sweet tea, diet sodas 4 a day   No living will, full code (reviewed 2014)    Family History  Problem Relation Age of Onset  . Breast cancer Unknown   . Stomach cancer Unknown   . Diabetes Unknown   . Heart disease Unknown   . Breast cancer Sister 65    Allergies  Allergen Reactions  . Sulfa Antibiotics Rash  . Sulfonamide Derivatives Rash    Medication list reviewed and updated in full in [facility].  GEN: No fevers, chills. Nontoxic. Primarily MSK c/o today. MSK: Detailed in the HPI GI: tolerating PO intake without difficulty Neuro: No numbness, parasthesias, or tingling associated. Otherwise the pertinent positives of the ROS are noted above.   Objective:   BP 110/62   Pulse 60   Temp 97.8 F (36.6 C) (Oral)   Ht '5\' 1"'$  (1.549 m)   Wt 155 lb 8 oz (70.5 kg)   BMI 29.38 kg/m    GEN: WDWN, NAD, Non-toxic, Alert & Oriented x 3 HEENT: Atraumatic, Normocephalic.  Ears and Nose: No external deformity. EXTR: No clubbing/cyanosis/edema NEURO: Normal gait.  PSYCH: Normally interactive. Conversant. Not depressed or anxious appearing.  Calm demeanor.   Knee:  R Gait: Normal heel toe pattern ROM: 0-115 Effusion: neg Echymosis or edema: none Patellar tendon NT Painful  PLICA: neg Patellar grind: negative Medial and lateral patellar facet loading: negative medial and lateral joint lines: marked medial joint line pain Mcmurray's pain Flexion-pinch pos Varus and valgus stress: stable Lachman: neg Ant and Post drawer: neg Hip abduction, IR, ER: WNL Hip flexion str: 5/5 Hip abd: 5/5 Quad: 5/5 VMO atrophy:No Hamstring concentric and eccentric: 5/5   Radiology: No results found.  Assessment and Plan:   Primary osteoarthritis of right knee  Right knee pain, unspecified chronicity - Plan: methylPREDNISolone acetate (DEPO-MEDROL) injection 40 mg  She is having a reasonable amount of functional  impairment.  She has had some good relief from corticosteroid injections in the past.  She did check with her insurance about Visco supplementation, but these are cost prohibitive for her at this time.  Knee Injection, R Patient verbally consented to procedure. Risks (including potential rare risk of infection), benefits, and alternatives explained. Sterilely prepped with Chloraprep. Ethyl cholride used for anesthesia. 8 cc Lidocaine 1% mixed with 2 mL Depo-Medrol 40 mg injected using the anteromedial approach.  I had to redirect the flow several times, I think secondary to arthritic changes, and I then prepped an anterolateral portal, and the patient was injected without difficulty. No complications with procedure and tolerated well. Patient had decreased pain post-injection.   Follow-up: prn only  Meds ordered this encounter  Medications  . methylPREDNISolone acetate (DEPO-MEDROL) injection 40 mg   Signed,  Revan Gendron T. Dauna Ziska, MD   Allergies as of 04/21/2017      Reactions   Sulfa Antibiotics Rash   Sulfonamide Derivatives Rash      Medication List        Accurate as of 04/21/17 11:59 PM. Always use your most recent med list.          ACCU-CHEK AVIVA PLUS w/Device Kit Use to check blood sugar once daily   ACCU-CHEK AVIVA Soln Use to check blood  sugar once daily   ACCU-CHEK SOFTCLIX LANCETS lancets Use to check blood sugar once daily   amLODipine 5 MG tablet Commonly known as:  NORVASC Take 1 tablet (5 mg total) by mouth daily.   aspirin EC 81 MG tablet Take 81 mg by mouth.   atenolol 100 MG tablet Commonly known as:  TENORMIN TAKE 1 TABLET EVERY DAY   atorvastatin 40 MG tablet Commonly known as:  LIPITOR TAKE 1 TABLET EVERY DAY   B-D SINGLE USE SWABS REGULAR Pads Use to check blood sugar once daily   CALCIUM 600+D 600-400 MG-UNIT tablet Generic drug:  Calcium Carbonate-Vitamin D Take 1 tablet by mouth daily.   CLARITIN 10 MG Caps Generic drug:  Loratadine Take 1 capsule by mouth as needed.   esomeprazole 40 MG capsule Commonly known as:  NEXIUM TAKE 1 CAPSULE 2 (TWO) TIMES DAILY BEFORE A MEAL.   FIBER-CAPS PO Take 1 capsule by mouth daily.   Fish Oil 1000 MG Caps Take 1 capsule by mouth daily.   FLUoxetine 20 MG capsule Commonly known as:  PROZAC TAKE 1 CAPSULE EVERY DAY   fluticasone 50 MCG/ACT nasal spray Commonly known as:  FLONASE PLACE 2 SPRAYS INTO THE NOSE DAILY AS DIRECTED   gabapentin 100 MG capsule Commonly known as:  NEURONTIN TAKE 1 CAPSULE IN THE MORNING, 1 CAPSULE AT LUNCH, 1 CAPSULES AT DINNER AND 2 TO 3 CAPSULES AT BEDTIME   Glucosamine 500 MG Tabs Take 1 tablet by mouth daily.   glucose blood test strip Commonly known as:  ACCU-CHEK AVIVA Use to check blood sugar once daily   HYDROcodone-acetaminophen 5-325 MG tablet Commonly known as:  NORCO/VICODIN Take 1 tablet by mouth 3 (three) times daily as needed for moderate pain.   ibandronate 150 MG tablet Commonly known as:  BONIVA Take 1 tablet (150 mg total) by mouth every 30 (thirty) days. Take in the morning with a full glass of water, on an empty stomach, and do not take anything else by mouth or lie down for the next 60 minutes.   latanoprost 0.005 % ophthalmic solution Commonly known as:  XALATAN PLACE 1 DROP EVRYDAY  INTO BOTH  EYES   lidocaine 5 % Commonly known as:  LIDODERM APPLY 1 PATCH ONTO THE SKIN EVERY DAY. REMOVE AND DISCARD PATCH WITHIN 12 HOURS OR AS DIRECTED BY PHYSICIAN.   losartan-hydrochlorothiazide 100-25 MG tablet Commonly known as:  HYZAAR TAKE 1 TABLET EVERY DAY   SUDAFED PO Take by mouth as needed.   traMADol 50 MG tablet Commonly known as:  ULTRAM TAKE 1 TABLET EVERY 6 HOURS AS NEEDED   vitamin B-12 1000 MCG tablet Commonly known as:  CYANOCOBALAMIN Take 1,000 mcg by mouth daily.

## 2017-04-22 DIAGNOSIS — H401132 Primary open-angle glaucoma, bilateral, moderate stage: Secondary | ICD-10-CM | POA: Diagnosis not present

## 2017-04-22 LAB — HM DIABETES EYE EXAM

## 2017-04-28 ENCOUNTER — Encounter: Payer: Self-pay | Admitting: Family Medicine

## 2017-05-06 ENCOUNTER — Ambulatory Visit
Admission: RE | Admit: 2017-05-06 | Discharge: 2017-05-06 | Disposition: A | Discharge status: Home / Self Care | Payer: Medicare HMO | Source: Ambulatory Visit | Attending: Family Medicine | Admitting: Family Medicine

## 2017-05-06 DIAGNOSIS — Z1231 Encounter for screening mammogram for malignant neoplasm of breast: Secondary | ICD-10-CM | POA: Diagnosis not present

## 2017-05-06 DIAGNOSIS — R928 Other abnormal and inconclusive findings on diagnostic imaging of breast: Secondary | ICD-10-CM | POA: Diagnosis not present

## 2017-05-06 NOTE — Progress Notes (Signed)
Abnormal Mammogram.  Needs further imaging.  Will be contacted by  imaging office to schedule further imaging per result note.

## 2017-05-07 ENCOUNTER — Other Ambulatory Visit: Payer: Self-pay | Admitting: Family Medicine

## 2017-05-07 DIAGNOSIS — R921 Mammographic calcification found on diagnostic imaging of breast: Secondary | ICD-10-CM

## 2017-05-07 DIAGNOSIS — R928 Other abnormal and inconclusive findings on diagnostic imaging of breast: Secondary | ICD-10-CM

## 2017-05-11 ENCOUNTER — Other Ambulatory Visit: Payer: Self-pay | Admitting: Family Medicine

## 2017-05-14 ENCOUNTER — Ambulatory Visit: Payer: Medicare HMO

## 2017-05-14 ENCOUNTER — Ambulatory Visit
Admission: RE | Admit: 2017-05-14 | Discharge: 2017-05-14 | Disposition: A | Discharge status: Home / Self Care | Payer: Medicare HMO | Source: Ambulatory Visit | Attending: Family Medicine | Admitting: Family Medicine

## 2017-05-14 ENCOUNTER — Other Ambulatory Visit: Payer: Self-pay | Admitting: Family Medicine

## 2017-05-14 DIAGNOSIS — R928 Other abnormal and inconclusive findings on diagnostic imaging of breast: Secondary | ICD-10-CM | POA: Insufficient documentation

## 2017-05-14 DIAGNOSIS — R921 Mammographic calcification found on diagnostic imaging of breast: Secondary | ICD-10-CM

## 2017-05-15 NOTE — Progress Notes (Signed)
Abnormal Mammogram.  Needs further imaging.  Will be contacted by  imaging office to schedule further imaging per result note.

## 2017-05-26 ENCOUNTER — Ambulatory Visit
Admission: RE | Admit: 2017-05-26 | Discharge: 2017-05-26 | Disposition: A | Discharge status: Home / Self Care | Payer: Medicare HMO | Source: Ambulatory Visit | Attending: Family Medicine | Admitting: Family Medicine

## 2017-05-26 DIAGNOSIS — R928 Other abnormal and inconclusive findings on diagnostic imaging of breast: Secondary | ICD-10-CM

## 2017-05-26 DIAGNOSIS — D0512 Intraductal carcinoma in situ of left breast: Secondary | ICD-10-CM | POA: Diagnosis not present

## 2017-05-26 DIAGNOSIS — R921 Mammographic calcification found on diagnostic imaging of breast: Secondary | ICD-10-CM

## 2017-05-26 HISTORY — PX: BREAST BIOPSY: SHX20

## 2017-05-27 DIAGNOSIS — H401132 Primary open-angle glaucoma, bilateral, moderate stage: Secondary | ICD-10-CM | POA: Diagnosis not present

## 2017-05-28 ENCOUNTER — Other Ambulatory Visit: Payer: Self-pay | Admitting: Pathology

## 2017-05-28 LAB — SURGICAL PATHOLOGY

## 2017-05-28 NOTE — Progress Notes (Unsigned)
mdt  

## 2017-05-29 ENCOUNTER — Encounter: Payer: Self-pay | Admitting: *Deleted

## 2017-05-29 DIAGNOSIS — D0512 Intraductal carcinoma in situ of left breast: Secondary | ICD-10-CM

## 2017-05-29 NOTE — Progress Notes (Signed)
  Oncology Nurse Navigator Documentation  Navigator Location: [facility] (05/29/17 1500) Referral date to RadOnc/MedOnc: 06/02/17 (05/29/17 1500) )Navigator Encounter Type: Introductory phone call (05/29/17 1500)   Abnormal Finding Date: 05/14/17 (05/29/17 1500) Confirmed Diagnosis Date: 05/28/17 (05/29/17 1500)                   Barriers/Navigation Needs: Education;Coordination of Care (05/29/17 1500) Education: Newly Diagnosed Cancer Education (05/29/17 1500) Interventions: Coordination of Care (05/29/17 1500)   Coordination of Care: Appts (05/29/17 1500)                  Time Spent with Patient: 45 (05/29/17 1500)   Called patient today to establish navigation services. She is newly diagnosed with DCIS of the left breast.   Patient would like to see Dr. Mike Gip for medical oncology since her husband is already a patient of hers. I have scheduled her to see her on Monday 06/02/17 @ 11:30.  She has also been scheduled to see Dr. Bary Castilla on 06/03/17 @ 8:00.  She is to bring a photo ID, meds, and insurance card.  She would like to go ahead and pick up her educational literature tomorrow.  I have left it at the front desk for her.  She is to call for any questions or needs.

## 2017-05-30 DIAGNOSIS — H401132 Primary open-angle glaucoma, bilateral, moderate stage: Secondary | ICD-10-CM | POA: Diagnosis not present

## 2017-06-02 ENCOUNTER — Inpatient Hospital Stay: Payer: Medicare HMO | Attending: Hematology and Oncology | Admitting: Hematology and Oncology

## 2017-06-02 ENCOUNTER — Other Ambulatory Visit: Payer: Self-pay

## 2017-06-02 ENCOUNTER — Encounter: Payer: Self-pay | Admitting: Hematology and Oncology

## 2017-06-02 ENCOUNTER — Ambulatory Visit
Admission: RE | Admit: 2017-06-02 | Discharge: 2017-06-02 | Disposition: A | Discharge status: Home / Self Care | Payer: Medicare HMO | Source: Ambulatory Visit | Attending: Urgent Care | Admitting: Urgent Care

## 2017-06-02 ENCOUNTER — Encounter: Payer: Self-pay | Admitting: *Deleted

## 2017-06-02 ENCOUNTER — Inpatient Hospital Stay: Payer: Medicare HMO

## 2017-06-02 VITALS — BP 171/78 | HR 56 | Temp 97.9°F | Resp 18 | Ht 60.0 in | Wt 160.5 lb

## 2017-06-02 DIAGNOSIS — M810 Age-related osteoporosis without current pathological fracture: Secondary | ICD-10-CM

## 2017-06-02 DIAGNOSIS — M85851 Other specified disorders of bone density and structure, right thigh: Secondary | ICD-10-CM | POA: Insufficient documentation

## 2017-06-02 DIAGNOSIS — Z90710 Acquired absence of both cervix and uterus: Secondary | ICD-10-CM

## 2017-06-02 DIAGNOSIS — D649 Anemia, unspecified: Secondary | ICD-10-CM

## 2017-06-02 DIAGNOSIS — Z808 Family history of malignant neoplasm of other organs or systems: Secondary | ICD-10-CM

## 2017-06-02 DIAGNOSIS — D0512 Intraductal carcinoma in situ of left breast: Secondary | ICD-10-CM | POA: Diagnosis not present

## 2017-06-02 DIAGNOSIS — Z13820 Encounter for screening for osteoporosis: Secondary | ICD-10-CM | POA: Diagnosis not present

## 2017-06-02 DIAGNOSIS — M8588 Other specified disorders of bone density and structure, other site: Secondary | ICD-10-CM | POA: Insufficient documentation

## 2017-06-02 DIAGNOSIS — Z803 Family history of malignant neoplasm of breast: Secondary | ICD-10-CM | POA: Insufficient documentation

## 2017-06-02 DIAGNOSIS — Z800 Family history of malignant neoplasm of digestive organs: Secondary | ICD-10-CM

## 2017-06-02 DIAGNOSIS — M8589 Other specified disorders of bone density and structure, multiple sites: Secondary | ICD-10-CM | POA: Diagnosis not present

## 2017-06-02 DIAGNOSIS — Z780 Asymptomatic menopausal state: Secondary | ICD-10-CM | POA: Diagnosis not present

## 2017-06-02 DIAGNOSIS — Z170 Estrogen receptor positive status [ER+]: Secondary | ICD-10-CM | POA: Diagnosis not present

## 2017-06-02 DIAGNOSIS — M8580 Other specified disorders of bone density and structure, unspecified site: Secondary | ICD-10-CM | POA: Insufficient documentation

## 2017-06-02 DIAGNOSIS — D509 Iron deficiency anemia, unspecified: Secondary | ICD-10-CM

## 2017-06-02 LAB — CBC WITH DIFFERENTIAL/PLATELET
BASOS ABS: 0 10*3/uL (ref 0–0.1)
Basophils Relative: 1 %
EOS ABS: 0.1 10*3/uL (ref 0–0.7)
Eosinophils Relative: 2 %
HCT: 32.2 % — ABNORMAL LOW (ref 35.0–47.0)
HEMOGLOBIN: 10.5 g/dL — AB (ref 12.0–16.0)
Lymphocytes Relative: 27 %
Lymphs Abs: 1.3 10*3/uL (ref 1.0–3.6)
MCH: 27.3 pg (ref 26.0–34.0)
MCHC: 32.6 g/dL (ref 32.0–36.0)
MCV: 83.7 fL (ref 80.0–100.0)
MONOS PCT: 9 %
Monocytes Absolute: 0.4 10*3/uL (ref 0.2–0.9)
NEUTROS PCT: 61 %
Neutro Abs: 3 10*3/uL (ref 1.4–6.5)
Platelets: 179 10*3/uL (ref 150–440)
RBC: 3.85 MIL/uL (ref 3.80–5.20)
RDW: 17 % — AB (ref 11.5–14.5)
WBC: 4.8 10*3/uL (ref 3.6–11.0)

## 2017-06-02 LAB — IRON AND TIBC
IRON: 34 ug/dL (ref 28–170)
SATURATION RATIOS: 12 % (ref 10.4–31.8)
TIBC: 275 ug/dL (ref 250–450)
UIBC: 241 ug/dL

## 2017-06-02 LAB — COMPREHENSIVE METABOLIC PANEL
ALK PHOS: 52 U/L (ref 38–126)
ALT: 14 U/L (ref 14–54)
AST: 15 U/L (ref 15–41)
Albumin: 3.9 g/dL (ref 3.5–5.0)
Anion gap: 10 (ref 5–15)
BILIRUBIN TOTAL: 0.6 mg/dL (ref 0.3–1.2)
BUN: 14 mg/dL (ref 6–20)
CO2: 25 mmol/L (ref 22–32)
CREATININE: 0.66 mg/dL (ref 0.44–1.00)
Calcium: 9.1 mg/dL (ref 8.9–10.3)
Chloride: 106 mmol/L (ref 101–111)
Glucose, Bld: 88 mg/dL (ref 65–99)
Potassium: 3.3 mmol/L — ABNORMAL LOW (ref 3.5–5.1)
Sodium: 141 mmol/L (ref 135–145)
Total Protein: 6.7 g/dL (ref 6.5–8.1)

## 2017-06-02 LAB — FERRITIN: Ferritin: 43 ng/mL (ref 11–307)

## 2017-06-02 LAB — FOLATE: Folate: 9.6 ng/mL (ref >5.9)

## 2017-06-02 NOTE — Progress Notes (Signed)
[facility] day:  06/02/2017  Chief Complaint: Ashley Ryan is a 72 y.o. 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  HPI:  The patient undergoes yearly mammograms.  Bilateral screening mammogram on 05/06/2017 revealed left breast calcifications.  Diagnostic left mammogram on 05/14/2017 revealed grouped pleomorphic calcifications within the lower outer quadrant of the LEFT breast, spanning 7 mm.  She underwent left breast biopsy and clip placement on 05/26/2017.  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  She is scheduled to see Dr Bary Castilla on 06/03/2017.  Symptomatically, she feels "pretty good".  She notes left knee swelling.  Bone density study on 11/22/2014 revealed osteopenia with a T-score of -2.4 in the AP spine L1-L4.  She had 2 children.  She was 18 with her first pregnancy.  She underwent hysterectomy in 1978.  She has one ovary.  She was on birth control pills for 4-5 years.  She was on hormone replacement therapy < 1 year.    Family history is notable for breast cancer in her sister (age 73).  There is no family history of ovarian cancer.  Her brother had esophageal/gastric cancer (age < 6).  She has a history of anemia.  She received a blood transfusion 6 years ago.  She took oral iron x 2 years, but stopped > 1 month ago.  She takes oral B12.    EGD at [facility] in [place] on 09/09/2013 revealed Cameron erosions, esophageal stricture s/p Maloney dilatation, and a large sliding hiatal hernia.  Colonoscopy on 09/09/2013 revealed an 8 mm sessile polyp in the cecum (tubular adenoma).   Past Medical History:  Diagnosis Date  . Anemia   . Arthritis   . Chronic headache   . Complication of anesthesia    prior to 1991 used to have PONV.  none recently.  . Diverticulosis of colon   . Family  history of adverse reaction to anesthesia    mother/daighter get sick  . GERD with stricture   . Hard of hearing   . Heart murmur    followed by PCP  . Hiatal hernia   . Hypertension   . Neuropathy    feet  . Osteoporosis   . Pre-diabetes   . Wears dentures    full upper    Past Surgical History:  Procedure Laterality Date  . ABDOMINAL HYSTERECTOMY  1978   one ovary remains  . BREAST BIOPSY Left 05/26/2017   Affirm Bx- path pending - coil clip  . CARDIAC CATHETERIZATION     "yrs ago" all OK.  Marland Kitchen CATARACT EXTRACTION W/PHACO Left 02/21/2016   Procedure: CATARACT EXTRACTION PHACO AND INTRAOCULAR LENS PLACEMENT ([place])  Left eye;  Surgeon: Leandrew Koyanagi, MD;  Location: [place];  Service: Ophthalmology;  Laterality: Left;  Left eye Diabetic  . CATARACT EXTRACTION W/PHACO Right 03/13/2016   Procedure: CATARACT EXTRACTION PHACO AND INTRAOCULAR LENS PLACEMENT ([place])  right  diabetic;  Surgeon: Leandrew Koyanagi, MD;  Location: [place];  Service: Ophthalmology;  Laterality: Right;  diabetic  . CHOLECYSTECTOMY  1978  . OOPHORECTOMY     one still left    Family History  Problem Relation Age of Onset  . Breast cancer Unknown   . Stomach cancer Unknown   . Diabetes Unknown   . Heart disease Unknown   . Breast cancer Sister 68  .  Esophageal cancer Brother     Social History:  reports that she has never smoked. She has never used smokeless tobacco. She reports that she does not drink alcohol or use drugs.  Her husband's name is Scientist, research (medical).  She is a retired [employer]ar[employer] manager.  She lives in [place].  The patient is accompanied by her daughter, Essie Christine, and nurse navigator, Tanya Nones, today.  Allergies:  Allergies  Allergen Reactions  . Sulfa Antibiotics Rash    Childhood reaction  . Sulfonamide Derivatives Rash    Childhood reaction.    Current Medications: Current Outpatient Medications  Medication Sig Dispense Refill  . ACCU-CHEK SOFTCLIX LANCETS lancets Use  to check blood sugar once daily 100 each 3  . Alcohol Swabs (B-D SINGLE USE SWABS REGULAR) PADS Use to check blood sugar once daily (Patient not taking: Reported on 06/03/2017) 100 each 3  . amLODipine (NORVASC) 5 MG tablet Take 1 tablet (5 mg total) by mouth daily. 90 tablet 3  . aspirin EC 81 MG tablet Take 81 mg by mouth daily.     Marland Kitchen atenolol (TENORMIN) 100 MG tablet TAKE 1 TABLET EVERY DAY 90 tablet 1  . atorvastatin (LIPITOR) 40 MG tablet TAKE 1 TABLET EVERY DAY (Patient taking differently: TAKE 1 TABLET EVERY DAY at bedtime) 90 tablet 1  . Blood Glucose Calibration (ACCU-CHEK AVIVA) SOLN Use to check blood sugar once daily (Patient not taking: Reported on 06/03/2017) 1 each 0  . Blood Glucose Monitoring Suppl (ACCU-CHEK AVIVA PLUS) W/DEVICE KIT Use to check blood sugar once daily (Patient not taking: Reported on 06/03/2017) 1 kit 0  . Calcium Carbonate-Vitamin D (CALCIUM 600+D) 600-400 MG-UNIT per tablet Take 1 tablet by mouth daily.      . Calcium Polycarbophil (FIBER-CAPS PO) Take 1 capsule by mouth daily.    Marland Kitchen esomeprazole (NEXIUM) 40 MG capsule TAKE 1 CAPSULE 2 (TWO) TIMES DAILY BEFORE A MEAL. 180 capsule 1  . FLUoxetine (PROZAC) 20 MG capsule TAKE 1 CAPSULE EVERY DAY 90 capsule 1  . fluticasone (FLONASE) 50 MCG/ACT nasal spray PLACE 2 SPRAYS INTO THE NOSE DAILY AS DIRECTED 48 g 3  . gabapentin (NEURONTIN) 100 MG capsule TAKE 1 CAPSULE IN THE MORNING, 1 CAPSULE AT LUNCH, 1 CAPSULES AT DINNER AND 2 TO 3 CAPSULES AT BEDTIME 540 capsule 5  . Glucosamine 500 MG TABS Take 500 mg by mouth daily.     Marland Kitchen glucose blood (ACCU-CHEK AVIVA) test strip Use to check blood sugar once daily (Patient not taking: Reported on 06/03/2017) 100 each 3  . lidocaine (LIDODERM) 5 % APPLY 1 PATCH ONTO THE SKIN EVERY DAY. REMOVE AND DISCARD PATCH WITHIN 12 HOURS OR AS DIRECTED BY PHYSICIAN. 90 patch 0  . losartan-hydrochlorothiazide (HYZAAR) 100-25 MG tablet TAKE 1 TABLET EVERY DAY (Patient taking differently: TAKE 1  TABLET EVERY DAY AT BEDTIME.) 90 tablet 1  . Omega-3 Fatty Acids (FISH OIL) 1000 MG CAPS Take 1,000 mg by mouth daily.     . traMADol (ULTRAM) 50 MG tablet TAKE 1 TABLET EVERY 6 HOURS AS NEEDED (Patient taking differently: TAKE 1 TABLET EVERY 6 HOURS AS NEEDED FOR PAIN.) 90 tablet 0  . vitamin B-12 (CYANOCOBALAMIN) 1000 MCG tablet Take 1,000 mcg by mouth daily.    Marland Kitchen latanoprost (XALATAN) 0.005 % ophthalmic solution Place 1 drop into both eyes at bedtime.    Marland Kitchen loratadine (CLARITIN) 10 MG tablet Take 10 mg by mouth daily.    . phenylephrine (SUDAFED PE) 10 MG TABS tablet Take 10  mg by mouth every 4 (four) hours as needed (for nasal congestion.).    Marland Kitchen Potassium Chloride ER 20 MEQ TBCR Take 1 tablet by mouth daily. 20 tablet 0   No current facility-administered medications for this visit.     Review of Systems:  GENERAL:  Feels "pretty good".  No fevers, sweats or weight loss. PERFORMANCE STATUS (ECOG):  1 HEENT:  Glaucoma s/p laser procedure.  No runny nose, sore throat, mouth sores or tenderness. Lungs: No shortness of breath or cough.  No hemoptysis. Cardiac:  No chest pain, palpitations, orthopnea, or PND. GI:  No nausea, vomiting, diarrhea, constipation, melena or hematochezia. GU:  No urgency, frequency, dysuria, or hematuria. Musculoskeletal:  No back pain.  Left knee swelling.  No muscle tenderness. Extremities:  No pain or swelling. Skin:  No rashes or skin changes. Neuro:  No headache, numbness or weakness, balance or coordination issues. Endocrine:  Pre-diabetes. No thyroid issues, hot flashes or night sweats. Psych:  No mood changes, depression or anxiety. Pain:  No focal pain. Review of systems:  All other systems reviewed and found to be negative.  Physical Exam: Blood pressure (!) 171/78, pulse (!) 56, temperature 97.9 F (36.6 C), temperature source Tympanic, resp. rate 18, height 5' (1.524 m), weight 160 lb 8 oz (72.8 kg). GENERAL:  Well developed, well nourished, woman  sitting comfortably in the exam room in no acute distress. MENTAL STATUS:  Alert and oriented to person, place and time. HEAD:  Short gray hair.  Normocephalic, atraumatic, face symmetric, no Cushingoid features. EYES:  Glasses.  Blue eyes.  Pupils equal round and reactive to light and accomodation.  No conjunctivitis or scleral icterus. ENT:  Oropharynx clear without lesion.  Tongue normal.  Upper plate. Mucous membranes moist.  RESPIRATORY:  Clear to auscultation without rales, wheezes or rhonchi. CARDIOVASCULAR:  Regular rate and rhythm without murmur, rub or gallop. BREAST:  Right breast without masses, skin changes or nipple discharge.  Left breast with fibrocystic changes at 9 o'clock.  Ecchymosis s/p biopsy at 5 o'clock with steri-strips in place.  No masses, skin changes or nipple discharge.  ABDOMEN:  Soft, non-tender, with active bowel sounds, and no hepatosplenomegaly.  No masses. SKIN:  No rashes, ulcers or lesions. EXTREMITIES: Chronic lower extremity changes (left > right).  No skin discoloration or tenderness.  No palpable cords. LYMPH NODES: No palpable cervical, supraclavicular, axillary or inguinal adenopathy  NEUROLOGICAL: Unremarkable. PSYCH:  Appropriate.   Orders Only on 06/02/2017  Component Date Value Ref Range Status  . Folate 06/02/2017 9.6  >5.9 ng/mL Final   Performed at [facility], [address] [place], [place] [address]  . Iron 06/02/2017 34  28 - 170 ug/dL Final  . TIBC 06/02/2017 275  250 - 450 ug/dL Final  . Saturation Ratios 06/02/2017 12  10.4 - 31.8 % Final  . UIBC 06/02/2017 241  ug/dL Final   Performed at [facility], [address] [place], [place] [address]  . Ferritin 06/02/2017 43  11 - 307 ng/mL Final   Performed at [facility], [address] [place], [place] [address]  . Sodium 06/02/2017 141  135 - 145 mmol/L Final  . Potassium 06/02/2017 3.3* 3.5 - 5.1 mmol/L Final  . Chloride 06/02/2017 106  101 - 111  mmol/L Final  . CO2 06/02/2017 25  22 - 32 mmol/L Final  . Glucose, Bld 06/02/2017 88  65 - 99 mg/dL Final  . BUN 06/02/2017 14  6 -  20 mg/dL Final  . Creatinine, Ser 06/02/2017 0.66  0.44 - 1.00 mg/dL Final  . Calcium 06/02/2017 9.1  8.9 - 10.3 mg/dL Final  . Total Protein 06/02/2017 6.7  6.5 - 8.1 g/dL Final  . Albumin 06/02/2017 3.9  3.5 - 5.0 g/dL Final  . AST 06/02/2017 15  15 - 41 U/L Final  . ALT 06/02/2017 14  14 - 54 U/L Final  . Alkaline Phosphatase 06/02/2017 52  38 - 126 U/L Final  . Total Bilirubin 06/02/2017 0.6  0.3 - 1.2 mg/dL Final  . GFR calc non Af Amer 06/02/2017 >60  >60 mL/min Final  . GFR calc Af Amer 06/02/2017 >60  >60 mL/min Final   Comment: (NOTE) The eGFR has been calculated using the CKD EPI equation. This calculation has not been validated in all clinical situations. eGFR's persistently <60 mL/min signify possible Chronic Kidney Disease.   Georgiann Hahn gap 06/02/2017 10  5 - 15 Final   Performed at [facility], [address] [place], [place] [address]  . WBC 06/02/2017 4.8  3.6 - 11.0 K/uL Final  . RBC 06/02/2017 3.85  3.80 - 5.20 MIL/uL Final  . Hemoglobin 06/02/2017 10.5* 12.0 - 16.0 g/dL Final  . HCT 06/02/2017 32.2* 35.0 - 47.0 % Final  . MCV 06/02/2017 83.7  80.0 - 100.0 fL Final  . MCH 06/02/2017 27.3  26.0 - 34.0 pg Final  . MCHC 06/02/2017 32.6  32.0 - 36.0 g/dL Final  . RDW 06/02/2017 17.0* 11.5 - 14.5 % Final  . Platelets 06/02/2017 179  150 - 440 K/uL Final  . Neutrophils Relative % 06/02/2017 61  % Final  . Neutro Abs 06/02/2017 3.0  1.4 - 6.5 K/uL Final  . Lymphocytes Relative 06/02/2017 27  % Final  . Lymphs Abs 06/02/2017 1.3  1.0 - 3.6 K/uL Final  . Monocytes Relative 06/02/2017 9  % Final  . Monocytes Absolute 06/02/2017 0.4  0.2 - 0.9 K/uL Final  . Eosinophils Relative 06/02/2017 2  % Final  . Eosinophils Absolute 06/02/2017 0.1  0 - 0.7 K/uL Final  . Basophils Relative 06/02/2017 1  % Final  . Basophils Absolute 06/02/2017  0.0  0 - 0.1 K/uL Final   Performed at [facility], [address] [place], [place] [address]    Assessment:  ALAINNA STAWICKI is a 72 y.o. female with left breast DCIS s/p biopsy on 05/26/2017.  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  Diagnostic left mammogram on 05/14/2017 revealed grouped pleomorphic calcifications within the lower outer quadrant of the LEFT breast, spanning 7 mm.  Bone density study on 11/22/2014 revealed osteopenia with a T-score of -2.4 in the AP spine L1-L4.  She has a history of anemia.  She received a blood transfusion 6 years ago.  She took oral iron x 2 years, but stopped > 1 month ago.  She takes oral B12.    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  Colonoscopy on 09/09/2013 revealed an 8 mm sessile polyp in the cecum (tubular adenoma).  Symptomatically, she feels good.  Exam reveals post-biopsy changes in the left breast.  Plan: 1.  Discuss diagnosis, staging, and management of breast cancer.  Current biopsy reveals stage 0 breast cancer (DCIS).  Discuss the plan for surgery (lumpectomy or mastectomy).   Discuss radiation post op if lumpectomy chosen.  Discuss testing for ER and  PR.  If tumor is ER/PR positive, discuss hormonal therapy (tamoxifen versus aromatase inhibitor).  Side effects reviewed. 2.  Discuss bone density performed in 2016.  Discuss the plan for repeat bone density.  Discuss management of osteopenia.  Discuss calcium 1200 mg/day and vitamin D 800 units/day. 3.  Discuss history of anemia.  Discuss history of iron and B12 supplementation.  Check labs today. 4.  Labs today: CBC with diff, CMP, ferritin, iron studies, folate. 5.  Schedule bone density. 6.  RTC after surgery for MD assessment, review of final pathology, and discussion regarding direction of therapy.   Lequita Asal, MD   06/02/2017, 4:35 PM

## 2017-06-02 NOTE — Progress Notes (Signed)
  Oncology Nurse Navigator Documentation  Navigator Location: [facility]-Med Onc (06/02/17 1400) Referral date to RadOnc/MedOnc: 06/02/17 (06/02/17 1400) )Navigator Encounter Type: Initial MedOnc (06/02/17 1400)                                                    Time Spent with Patient: 51 (06/02/17 1400)   Met patient and her daughter today during her initial medical oncology consult with Dr. Mike Gip.  She is newly diagnosed with DCIS.  She has a surgical consult with Dr. Bary Castilla tomorrow.  She is to call if she has any questions or needs.

## 2017-06-02 NOTE — Progress Notes (Signed)
Patient here today as new evaluation regarding DCIS.  Referred by Dr. Diona Browner.

## 2017-06-03 ENCOUNTER — Encounter: Payer: Self-pay | Admitting: General Surgery

## 2017-06-03 ENCOUNTER — Other Ambulatory Visit: Payer: Self-pay | Admitting: General Surgery

## 2017-06-03 ENCOUNTER — Ambulatory Visit: Payer: Medicare HMO | Admitting: General Surgery

## 2017-06-03 ENCOUNTER — Ambulatory Visit: Payer: Self-pay

## 2017-06-03 VITALS — BP 132/84 | HR 61 | Resp 14 | Ht 61.0 in | Wt 160.0 lb

## 2017-06-03 DIAGNOSIS — D0512 Intraductal carcinoma in situ of left breast: Secondary | ICD-10-CM

## 2017-06-03 NOTE — Progress Notes (Signed)
x

## 2017-06-03 NOTE — Progress Notes (Signed)
Patient ID: Ashley Ryan, female   DOB: Jan 15, 1946, 72 y.o.   MRN: [id number]  Chief Complaint  Patient presents with  . Breast Problem    HPI ARLIS EVERLY is a 72 y.o. female.  who presents for a breast evaluation. The most recent left mammogram and biopsy was done on 05-26-17. Prior mammogram June 2016. Patient does perform regular self breast checks and typically gets regular mammograms done, she 2017-2018 due to her husband's illness.  Her husband has leukemia. She could not feel anything different in the breast.   She is hard of hearing and wears hearing aids.  She works with [employer] as [employer]. She is here with her daughter, Essie Christine.  HPI  Past Medical History:  Diagnosis Date  . Anemia   . Arthritis   . Chronic headache   . Complication of anesthesia    prior to 1991 used to have PONV.  none recently.  . Diverticulosis of colon   . GERD with stricture   . Hard of hearing   . Heart murmur    followed by PCP  . Hiatal hernia   . Hypertension   . Neuropathy    feet  . Osteoporosis   . Pre-diabetes   . Wears dentures    full upper    Past Surgical History:  Procedure Laterality Date  . ABDOMINAL HYSTERECTOMY  1978   one ovary remains  . BREAST BIOPSY Left 05/26/2017   Affirm Bx- path pending - coil clip  . CARDIAC CATHETERIZATION     "yrs ago" all OK.  Marland Kitchen CATARACT EXTRACTION W/PHACO Left 02/21/2016   Procedure: CATARACT EXTRACTION PHACO AND INTRAOCULAR LENS PLACEMENT ([place])  Left eye;  Surgeon: Leandrew Koyanagi, MD;  Location: [place];  Service: Ophthalmology;  Laterality: Left;  Left eye Diabetic  . CATARACT EXTRACTION W/PHACO Right 03/13/2016   Procedure: CATARACT EXTRACTION PHACO AND INTRAOCULAR LENS PLACEMENT ([place])  right  diabetic;  Surgeon: Leandrew Koyanagi, MD;  Location: [place];  Service: Ophthalmology;  Laterality: Right;  diabetic  . CHOLECYSTECTOMY  1978  . OOPHORECTOMY     one still left    Family History  Problem  Relation Age of Onset  . Breast cancer Unknown   . Stomach cancer Unknown   . Diabetes Unknown   . Heart disease Unknown   . Breast cancer Sister 3  . Esophageal cancer Brother     Social History Social History   Tobacco Use  . Smoking status: Never Smoker  . Smokeless tobacco: Never Used  Substance Use Topics  . Alcohol use: No  . Drug use: No    Allergies  Allergen Reactions  . Sulfa Antibiotics Rash  . Sulfonamide Derivatives Rash    Current Outpatient Medications  Medication Sig Dispense Refill  . ACCU-CHEK SOFTCLIX LANCETS lancets Use to check blood sugar once daily 100 each 3  . amLODipine (NORVASC) 5 MG tablet Take 1 tablet (5 mg total) by mouth daily. 90 tablet 3  . aspirin EC 81 MG tablet Take 81 mg by mouth.    Marland Kitchen atenolol (TENORMIN) 100 MG tablet TAKE 1 TABLET EVERY DAY 90 tablet 1  . atorvastatin (LIPITOR) 40 MG tablet TAKE 1 TABLET EVERY DAY 90 tablet 1  . Calcium Carbonate-Vitamin D (CALCIUM 600+D) 600-400 MG-UNIT per tablet Take 1 tablet by mouth daily.      . Calcium Polycarbophil (FIBER-CAPS PO) Take 1 capsule by mouth daily.    Marland Kitchen esomeprazole (NEXIUM) 40 MG capsule  TAKE 1 CAPSULE 2 (TWO) TIMES DAILY BEFORE A MEAL. 180 capsule 1  . FLUoxetine (PROZAC) 20 MG capsule TAKE 1 CAPSULE EVERY DAY 90 capsule 1  . fluticasone (FLONASE) 50 MCG/ACT nasal spray PLACE 2 SPRAYS INTO THE NOSE DAILY AS DIRECTED 48 g 3  . gabapentin (NEURONTIN) 100 MG capsule TAKE 1 CAPSULE IN THE MORNING, 1 CAPSULE AT LUNCH, 1 CAPSULES AT DINNER AND 2 TO 3 CAPSULES AT BEDTIME 540 capsule 5  . Glucosamine 500 MG TABS Take 1 tablet by mouth daily.      Marland Kitchen latanoprost (XALATAN) 0.005 % ophthalmic solution PLACE 1 DROP EVRYDAY INTO BOTH EYES  5  . lidocaine (LIDODERM) 5 % APPLY 1 PATCH ONTO THE SKIN EVERY DAY. REMOVE AND DISCARD PATCH WITHIN 12 HOURS OR AS DIRECTED BY PHYSICIAN. 90 patch 0  . Loratadine (CLARITIN) 10 MG CAPS Take 1 capsule by mouth as needed.      Marland Kitchen losartan-hydrochlorothiazide  (HYZAAR) 100-25 MG tablet TAKE 1 TABLET EVERY DAY 90 tablet 1  . Omega-3 Fatty Acids (FISH OIL) 1000 MG CAPS Take 1 capsule by mouth daily.    . Pseudoephedrine HCl (SUDAFED PO) Take by mouth as needed.    . traMADol (ULTRAM) 50 MG tablet TAKE 1 TABLET EVERY 6 HOURS AS NEEDED 90 tablet 0  . vitamin B-12 (CYANOCOBALAMIN) 1000 MCG tablet Take 1,000 mcg by mouth daily.    . Alcohol Swabs (B-D SINGLE USE SWABS REGULAR) PADS Use to check blood sugar once daily (Patient not taking: Reported on 06/03/2017) 100 each 3  . Blood Glucose Calibration (ACCU-CHEK AVIVA) SOLN Use to check blood sugar once daily (Patient not taking: Reported on 06/03/2017) 1 each 0  . Blood Glucose Monitoring Suppl (ACCU-CHEK AVIVA PLUS) W/DEVICE KIT Use to check blood sugar once daily (Patient not taking: Reported on 06/03/2017) 1 kit 0  . glucose blood (ACCU-CHEK AVIVA) test strip Use to check blood sugar once daily (Patient not taking: Reported on 06/03/2017) 100 each 3   No current facility-administered medications for this visit.     Review of Systems Review of Systems  Constitutional: Negative.   Respiratory: Negative.   Cardiovascular: Negative.     Blood pressure 132/84, pulse 61, resp. rate 14, height '5\' 1"'$  (1.549 m), weight 160 lb (72.6 kg).  Physical Exam Physical Exam  Constitutional: She is oriented to person, place, and time. She appears well-developed and well-nourished.  HENT:  Mouth/Throat: Oropharynx is clear and moist.  Eyes: Conjunctivae are normal. No scleral icterus.  Neck: Neck supple.  Cardiovascular: Normal rate, regular rhythm and normal heart sounds.  Pulmonary/Chest: Effort normal and breath sounds normal. Right breast exhibits no inverted nipple, no mass, no nipple discharge, no skin change and no tenderness. Left breast exhibits skin change. Left breast exhibits no inverted nipple, no mass, no nipple discharge and no tenderness.  Bruising left breast biopsy site.    Lymphadenopathy:     She has no cervical adenopathy.    She has no axillary adenopathy.  Neurological: She is alert and oriented to person, place, and time.  Skin: Skin is warm and dry.  Psychiatric: Her behavior is normal.    Data Reviewed May 26, 2017 biopsy results.  DIAGNOSIS:  A. LEFT BREAST, LOWER OUTER QUADRANT; STEREOTACTIC BIOPSY:  - DUCTAL CARCINOMA IN SITU, HIGH NUCLEAR GRADE WITH CALCIFICATIONS AND  FOCAL COMEDONECROSIS.   Note: Biomarker testing is deferred to an excision specimen.   Ultrasound examination was completed to determine if preoperative wire localization would  be required.  A clear-cut biopsy cavity is not appreciated.  Significant retroareolar ductal dilatation noted. No images, no charge.  Medical oncology assessment of Jun 02, 2017 reviewed.   Bone density of 2016 showed osteopenia, improved from osteoporosis noted in 2014.   Assessment    High grade DCIS.    Plan    The majority of the visit was spent reviewing the options for breast cancer treatment. Breast conservation with lumpectomy and radiation therapy  was presented as equivalent to mastectomy for long-term control. The pros and cons of each treatment regimen were reviewed.  With a small lesion and even with the patient's busy schedule, at this time she feels she will be best served with wide excision and postoperative radiation therapy.  As the biopsy cavity is not visible on ultrasound today, wire localization would be appropriate.  The procedure was reviewed with the patient.  Repeat bone density would be appropriate.     HPI, Physical Exam, Assessment and Plan have been scribed under the direction and in the presence of Robert Bellow, MD. Karie Fetch, RN   I have completed the exam and reviewed the above documentation for accuracy and completeness.  I agree with the above.  Haematologist has been used and any errors in dictation or transcription are unintentional.  Hervey Ard, M.D.,  F.A.C.S.  The patient is scheduled for surgery at [facility] on 06/13/17. She will pre admit at the hospital. We will call the patient with her pre admit time and day and also her surgery arrival time and location. The patient is aware of date and instructions.  Documented by Caryl-Lyn Otis Brace LPN  Forest Gleason Saed Hudlow 06/03/2017, 9:54 PM

## 2017-06-03 NOTE — Patient Instructions (Addendum)
The patient is aware to call back for any questions or concerns.   The patient is scheduled for surgery at [facility] on 06/13/17. She will pre admit at the hospital. We will call the patient with her pre admit time and day and also her surgery arrival time and location. The patient is aware of date and instructions.

## 2017-06-04 ENCOUNTER — Telehealth: Payer: Self-pay

## 2017-06-04 NOTE — Telephone Encounter (Signed)
Patient notified of Pre Admit testing, May 16th at 1:45 pm. She will also arrive to the [facility] on 06/13/17 at 7:45 am. She is aware of dates, times, and instructions.

## 2017-06-05 ENCOUNTER — Other Ambulatory Visit: Payer: Medicare HMO

## 2017-06-05 ENCOUNTER — Other Ambulatory Visit: Payer: Self-pay

## 2017-06-05 ENCOUNTER — Encounter
Admission: RE | Admit: 2017-06-05 | Discharge: 2017-06-05 | Disposition: A | Discharge status: Home / Self Care | Payer: Medicare HMO | Source: Ambulatory Visit | Attending: General Surgery | Admitting: General Surgery

## 2017-06-05 ENCOUNTER — Other Ambulatory Visit: Payer: Self-pay | Admitting: General Surgery

## 2017-06-05 ENCOUNTER — Telehealth: Payer: Self-pay | Admitting: *Deleted

## 2017-06-05 DIAGNOSIS — R7303 Prediabetes: Secondary | ICD-10-CM | POA: Diagnosis not present

## 2017-06-05 DIAGNOSIS — E785 Hyperlipidemia, unspecified: Secondary | ICD-10-CM | POA: Insufficient documentation

## 2017-06-05 DIAGNOSIS — I10 Essential (primary) hypertension: Secondary | ICD-10-CM | POA: Insufficient documentation

## 2017-06-05 DIAGNOSIS — Z01810 Encounter for preprocedural cardiovascular examination: Secondary | ICD-10-CM | POA: Diagnosis not present

## 2017-06-05 DIAGNOSIS — R001 Bradycardia, unspecified: Secondary | ICD-10-CM | POA: Diagnosis not present

## 2017-06-05 HISTORY — DX: Family history of other specified conditions: Z84.89

## 2017-06-05 MED ORDER — POTASSIUM CHLORIDE ER 20 MEQ PO TBCR: 1.0000 | EXTENDED_RELEASE_TABLET | Freq: Every day | ORAL | 0 refills | Status: DC

## 2017-06-05 NOTE — Telephone Encounter (Signed)
Per Judeen Hammans with the Pre-admission Testing Department, the patient had labs drawn on 06-02-17 with a reported low potassium of 3.3 mmol/L.   The patient is presently scheduled for surgery with Dr. Bary Castilla on Friday, 06-13-17.  Message sent to Dr. Bary Castilla.   Repeat potassium will be done the day of surgery.

## 2017-06-05 NOTE — Pre-Procedure Instructions (Signed)
Sharyn Lull at [facility] notified of Pt's 3.3 K level, need for supplement and repeat lab to be drawn DOS. She will forward info to Dr Bary Castilla.

## 2017-06-05 NOTE — Telephone Encounter (Signed)
Patient called the office back and is aware. She verbalizes understanding.

## 2017-06-05 NOTE — Telephone Encounter (Signed)
RX w/ KCL 20 mEq daily. RX sent to pharmacy.

## 2017-06-05 NOTE — Patient Instructions (Signed)
Your procedure is scheduled on: Friday 06/13/17 Report to [place] AT 7:45 AS INSTRUCTED  [phone] SDS  Remember: Instructions that are not followed completely may result in serious medical risk, up to and including death, or upon the discretion of your surgeon and anesthesiologist your surgery may need to be rescheduled.     _X__ 1. Do not eat food after midnight the night before your procedure.                 No gum chewing or hard candies. You may drink clear liquids up to 2 hours                 before you are scheduled to arrive for your surgery- DO not drink clear                 liquids within 2 hours of the start of your surgery.                 Clear Liquids include:  water, apple juice without pulp, clear carbohydrate                 drink such as Clearfast or Gatorade, Black Coffee or Tea (Do not add                 anything to coffee or tea).  __X__2.  On the morning of surgery brush your teeth with toothpaste and water, you                 may rinse your mouth with mouthwash if you wish.  Do not swallow any              toothpaste of mouthwash.     _X__ 3.  No Alcohol for 24 hours before or after surgery.   _X__ 4.  Do Not Smoke or use e-cigarettes For 24 Hours Prior to Your Surgery.                 Do not use any chewable tobacco products for at least 6 hours prior to                 surgery.  ____  5.  Bring all medications with you on the day of surgery if instructed.   __X__  6.  Notify your doctor if there is any change in your medical condition      (cold, fever, infections).     Do not wear jewelry, make-up, hairpins, clips or nail polish. Do not wear lotions, powders, or perfumes.  Do not shave 48 hours prior to surgery. Men may shave face and neck. Do not bring valuables to the hospital.    [facility] is not responsible for any belongings or valuables.  Contacts, dentures/partials or body piercings may not be worn into surgery. Bring a case  for your contacts, glasses or hearing aids, a denture cup will be supplied. Leave your suitcase in the car. After surgery it may be brought to your room. For patients admitted to the hospital, discharge time is determined by your treatment team.   Patients discharged the day of surgery will not be allowed to drive home.   Please read over the following fact sheets that you were given:   MRSA Information  __X__ Take these medicines the morning of surgery with A SIP OF WATER:    1. amlodipine  2. atenolol  3. nexium  4. prozac  5. gabapentin  6.  loratadine  ____ Fleet Enema (as directed)   __X__ Use CHG Soap/SAGE wipes as directed  ____ Use inhalers on the day of surgery  ____ Stop metformin/Janumet/Farxiga 2 days prior to surgery    ____ Take 1/2 of usual insulin dose the night before surgery. No insulin the morning          of surgery.   __X__ Stop Blood Thinners Coumadin/Plavix/Xarelto/Pleta/Pradaxa/Eliquis/Effient/Aspirin  on TODAY  Or contact your Surgeon, Cardiologist or Medical Doctor regarding  ability to stop your blood thinners  __X__ Stop Anti-inflammatories 7 days before surgery such as Advil, Ibuprofen, Motrin,  BC or Goodies Powder, Naprosyn, Naproxen, Aleve, Aspirin   TYLENOL OK __X__ Stop all herbal supplements, fish oil or vitamin E until after surgery.    ____ Bring C-Pap to the hospital.

## 2017-06-05 NOTE — Telephone Encounter (Signed)
Message left on cell phone regarding prescription and needing to begin potassium supplement today.   I have requested that the patient call back to confirm that she received the message.

## 2017-06-06 ENCOUNTER — Encounter: Payer: Self-pay | Admitting: Hematology and Oncology

## 2017-06-09 ENCOUNTER — Telehealth: Payer: Self-pay | Admitting: General Surgery

## 2017-06-09 ENCOUNTER — Other Ambulatory Visit: Payer: Self-pay | Admitting: General Surgery

## 2017-06-09 MED ORDER — LIDOCAINE-PRILOCAINE 2.5-2.5 % EX CREA: 1.0000 "application " | TOPICAL_CREAM | CUTANEOUS | 0 refills | Status: DC | PRN

## 2017-06-09 NOTE — Progress Notes (Signed)
Patient has decided to have a mastectomy rather than wide excision. Reviewed roll of EMLA cream prior to SLN injection.

## 2017-06-09 NOTE — Telephone Encounter (Signed)
PATIENT CALLED AND STATES SHE NOW WOULD LIKE TO HAVE A MASTECTOMY. SHE CURRENTLY IS SCHEDULE FOR SURGERY ON Friday 06-13-17 FOR A LT WIDE EXCISION WITH A NEEDLE LOC. SHE ALSO WANTED TO KNOW THE DIFFERENCE IN THE RECOVERY TIMES.

## 2017-06-10 ENCOUNTER — Other Ambulatory Visit: Payer: Self-pay

## 2017-06-10 ENCOUNTER — Other Ambulatory Visit: Payer: Self-pay | Admitting: General Surgery

## 2017-06-10 DIAGNOSIS — D0512 Intraductal carcinoma in situ of left breast: Secondary | ICD-10-CM

## 2017-06-10 NOTE — Telephone Encounter (Signed)
Spoke with patient about new surgery and she is confirmed that she does want to go ahead with the mastectomy. I let her know that she would be having a SLN biopsy done as well. She will arrive at the Radiology desk at [facility] in the [place] on 06/13/17 at 8:15 am. She is aware to apply the Emila cream on hour prior to this and cover with plastic wrap. She is aware of date, time, and instructions.

## 2017-06-11 ENCOUNTER — Encounter: Payer: Self-pay | Admitting: Genetics

## 2017-06-12 ENCOUNTER — Inpatient Hospital Stay: Payer: Medicare HMO

## 2017-06-12 MED ORDER — CEFAZOLIN SODIUM-DEXTROSE 2-4 GM/100ML-% IV SOLN
2.0000 g | INTRAVENOUS | Status: AC
  Administered 2017-06-13: 2 g via INTRAVENOUS

## 2017-06-13 ENCOUNTER — Ambulatory Visit: Payer: Medicare HMO | Admitting: Anesthesiology

## 2017-06-13 ENCOUNTER — Other Ambulatory Visit: Payer: Self-pay

## 2017-06-13 ENCOUNTER — Ambulatory Visit: Payer: Medicare HMO

## 2017-06-13 ENCOUNTER — Encounter
Admission: RE | Disposition: A | Discharge status: Home / Self Care | Payer: Self-pay | Source: Ambulatory Visit | Attending: General Surgery

## 2017-06-13 ENCOUNTER — Ambulatory Visit
Admission: RE | Admit: 2017-06-13 | Discharge: 2017-06-13 | Disposition: A | Discharge status: Home / Self Care | Payer: Medicare HMO | Source: Ambulatory Visit | Attending: General Surgery | Admitting: General Surgery

## 2017-06-13 ENCOUNTER — Encounter: Payer: Self-pay | Admitting: Anesthesiology

## 2017-06-13 DIAGNOSIS — Z79899 Other long term (current) drug therapy: Secondary | ICD-10-CM | POA: Insufficient documentation

## 2017-06-13 DIAGNOSIS — I10 Essential (primary) hypertension: Secondary | ICD-10-CM | POA: Insufficient documentation

## 2017-06-13 DIAGNOSIS — E119 Type 2 diabetes mellitus without complications: Secondary | ICD-10-CM | POA: Insufficient documentation

## 2017-06-13 DIAGNOSIS — Z7982 Long term (current) use of aspirin: Secondary | ICD-10-CM | POA: Insufficient documentation

## 2017-06-13 DIAGNOSIS — R011 Cardiac murmur, unspecified: Secondary | ICD-10-CM | POA: Insufficient documentation

## 2017-06-13 DIAGNOSIS — M1990 Unspecified osteoarthritis, unspecified site: Secondary | ICD-10-CM | POA: Diagnosis not present

## 2017-06-13 DIAGNOSIS — C50919 Malignant neoplasm of unspecified site of unspecified female breast: Secondary | ICD-10-CM

## 2017-06-13 DIAGNOSIS — C50012 Malignant neoplasm of nipple and areola, left female breast: Secondary | ICD-10-CM | POA: Diagnosis not present

## 2017-06-13 DIAGNOSIS — D0512 Intraductal carcinoma in situ of left breast: Secondary | ICD-10-CM

## 2017-06-13 DIAGNOSIS — E1140 Type 2 diabetes mellitus with diabetic neuropathy, unspecified: Secondary | ICD-10-CM | POA: Diagnosis not present

## 2017-06-13 DIAGNOSIS — K219 Gastro-esophageal reflux disease without esophagitis: Secondary | ICD-10-CM | POA: Insufficient documentation

## 2017-06-13 DIAGNOSIS — N6012 Diffuse cystic mastopathy of left breast: Secondary | ICD-10-CM | POA: Diagnosis not present

## 2017-06-13 DIAGNOSIS — Z882 Allergy status to sulfonamides status: Secondary | ICD-10-CM | POA: Insufficient documentation

## 2017-06-13 DIAGNOSIS — C50912 Malignant neoplasm of unspecified site of left female breast: Secondary | ICD-10-CM | POA: Diagnosis not present

## 2017-06-13 HISTORY — DX: Malignant neoplasm of unspecified site of unspecified female breast: C50.919

## 2017-06-13 HISTORY — PX: SIMPLE MASTECTOMY WITH AXILLARY SENTINEL NODE BIOPSY: SHX6098

## 2017-06-13 HISTORY — PX: SENTINEL NODE BIOPSY: SHX6608

## 2017-06-13 HISTORY — PX: MASTECTOMY: SHX3

## 2017-06-13 LAB — POCT I-STAT 4, (NA,K, GLUC, HGB,HCT)
GLUCOSE: 99 mg/dL (ref 65–99)
HEMATOCRIT: 33 % — AB (ref 36.0–46.0)
HEMOGLOBIN: 11.2 g/dL — AB (ref 12.0–15.0)
POTASSIUM: 4.2 mmol/L (ref 3.5–5.1)
SODIUM: 140 mmol/L (ref 135–145)

## 2017-06-13 LAB — GLUCOSE, CAPILLARY
GLUCOSE-CAPILLARY: 102 mg/dL — AB (ref 65–99)
Glucose-Capillary: 112 mg/dL — ABNORMAL HIGH (ref 65–99)

## 2017-06-13 SURGERY — SIMPLE MASTECTOMY
Anesthesia: General | Laterality: Left | Wound class: Clean

## 2017-06-13 MED ORDER — BUPIVACAINE-EPINEPHRINE (PF) 0.5% -1:200000 IJ SOLN
INTRAMUSCULAR | Status: AC
  Filled 2017-06-13: qty 30

## 2017-06-13 MED ORDER — CEFAZOLIN SODIUM-DEXTROSE 2-4 GM/100ML-% IV SOLN
INTRAVENOUS | Status: AC
  Filled 2017-06-13: qty 100

## 2017-06-13 MED ORDER — EPHEDRINE SULFATE 50 MG/ML IJ SOLN
INTRAMUSCULAR | Status: DC | PRN
  Administered 2017-06-13: 5 mg via INTRAVENOUS

## 2017-06-13 MED ORDER — FENTANYL CITRATE (PF) 100 MCG/2ML IJ SOLN
INTRAMUSCULAR | Status: AC
  Filled 2017-06-13: qty 2

## 2017-06-13 MED ORDER — MIDAZOLAM HCL 2 MG/2ML IJ SOLN
INTRAMUSCULAR | Status: AC
  Filled 2017-06-13: qty 2

## 2017-06-13 MED ORDER — ONDANSETRON HCL 4 MG/2ML IJ SOLN: 4.0000 mg | Freq: Once | INTRAMUSCULAR | Status: DC | PRN

## 2017-06-13 MED ORDER — PROPOFOL 10 MG/ML IV BOLUS
INTRAVENOUS | Status: AC
  Filled 2017-06-13: qty 20

## 2017-06-13 MED ORDER — ACETAMINOPHEN 10 MG/ML IV SOLN
INTRAVENOUS | Status: DC | PRN
  Administered 2017-06-13: 1000 mg via INTRAVENOUS

## 2017-06-13 MED ORDER — DEXAMETHASONE SODIUM PHOSPHATE 10 MG/ML IJ SOLN
INTRAMUSCULAR | Status: DC | PRN
  Administered 2017-06-13: 4 mg via INTRAVENOUS

## 2017-06-13 MED ORDER — KETOROLAC TROMETHAMINE 30 MG/ML IJ SOLN
INTRAMUSCULAR | Status: DC | PRN
  Administered 2017-06-13: 30 mg via INTRAVENOUS

## 2017-06-13 MED ORDER — FENTANYL CITRATE (PF) 100 MCG/2ML IJ SOLN
25.0000 ug | INTRAMUSCULAR | Status: DC | PRN
  Administered 2017-06-13 (×2): 25 ug via INTRAVENOUS

## 2017-06-13 MED ORDER — ACETAMINOPHEN 10 MG/ML IV SOLN
INTRAVENOUS | Status: AC
  Filled 2017-06-13: qty 100

## 2017-06-13 MED ORDER — LACTATED RINGERS IV SOLN
INTRAVENOUS | Status: DC
  Administered 2017-06-13: 09:00:00 via INTRAVENOUS

## 2017-06-13 MED ORDER — SUCCINYLCHOLINE CHLORIDE 20 MG/ML IJ SOLN
INTRAMUSCULAR | Status: AC
  Filled 2017-06-13: qty 1

## 2017-06-13 MED ORDER — CEFAZOLIN SODIUM-DEXTROSE 2-4 GM/100ML-% IV SOLN: 2.0000 g | INTRAVENOUS | Status: DC

## 2017-06-13 MED ORDER — TECHNETIUM TC 99M SULFUR COLLOID FILTERED
1.0000 | Freq: Once | INTRAVENOUS | Status: AC | PRN
  Administered 2017-06-13: 0.949 via INTRADERMAL

## 2017-06-13 MED ORDER — ONDANSETRON HCL 4 MG/2ML IJ SOLN
INTRAMUSCULAR | Status: DC | PRN
  Administered 2017-06-13: 4 mg via INTRAVENOUS

## 2017-06-13 MED ORDER — GLYCOPYRROLATE 0.2 MG/ML IJ SOLN
INTRAMUSCULAR | Status: DC | PRN
  Administered 2017-06-13: 0.2 mg via INTRAVENOUS

## 2017-06-13 MED ORDER — MIDAZOLAM HCL 2 MG/2ML IJ SOLN
INTRAMUSCULAR | Status: DC | PRN
  Administered 2017-06-13 (×2): 1 mg via INTRAVENOUS

## 2017-06-13 MED ORDER — LIDOCAINE HCL (CARDIAC) PF 100 MG/5ML IV SOSY
PREFILLED_SYRINGE | INTRAVENOUS | Status: DC | PRN
  Administered 2017-06-13: 60 mg via INTRAVENOUS

## 2017-06-13 MED ORDER — PROPOFOL 10 MG/ML IV BOLUS
INTRAVENOUS | Status: DC | PRN
  Administered 2017-06-13: 150 mg via INTRAVENOUS

## 2017-06-13 MED ORDER — FENTANYL CITRATE (PF) 100 MCG/2ML IJ SOLN
INTRAMUSCULAR | Status: DC | PRN
  Administered 2017-06-13 (×4): 25 ug via INTRAVENOUS

## 2017-06-13 MED ORDER — FENTANYL CITRATE (PF) 100 MCG/2ML IJ SOLN
INTRAMUSCULAR | Status: AC
  Administered 2017-06-13: 25 ug via INTRAVENOUS
  Filled 2017-06-13: qty 2

## 2017-06-13 MED ORDER — LIDOCAINE HCL (PF) 2 % IJ SOLN
INTRAMUSCULAR | Status: AC
  Filled 2017-06-13: qty 10

## 2017-06-13 MED ORDER — METHYLENE BLUE 0.5 % INJ SOLN
INTRAVENOUS | Status: DC | PRN
  Administered 2017-06-13: 5 mL via SUBMUCOSAL

## 2017-06-13 SURGICAL SUPPLY — 56 items
APPLIER CLIP 11 MED OPEN (CLIP)
APPLIER CLIP 13 LRG OPEN (CLIP)
BINDER BREAST LRG (GAUZE/BANDAGES/DRESSINGS) IMPLANT
BINDER BREAST MEDIUM (GAUZE/BANDAGES/DRESSINGS) ×2 IMPLANT
BINDER BREAST XLRG (GAUZE/BANDAGES/DRESSINGS) IMPLANT
BINDER BREAST XXLRG (GAUZE/BANDAGES/DRESSINGS) IMPLANT
BLADE PHOTON ILLUMINATED (MISCELLANEOUS) IMPLANT
BLADE SURG 15 STRL SS SAFETY (BLADE) ×2 IMPLANT
BULB RESERV EVAC DRAIN JP 100C (MISCELLANEOUS) ×2 IMPLANT
CANISTER SUCT 1200ML W/VALVE (MISCELLANEOUS) ×2 IMPLANT
CHLORAPREP W/TINT 26ML (MISCELLANEOUS) ×2 IMPLANT
CLIP APPLIE 11 MED OPEN (CLIP) IMPLANT
CLIP APPLIE 13 LRG OPEN (CLIP) IMPLANT
CNTNR SPEC 2.5X3XGRAD LEK (MISCELLANEOUS) ×3
CONT SPEC 4OZ STER OR WHT (MISCELLANEOUS) ×3
CONTAINER SPEC 2.5X3XGRAD LEK (MISCELLANEOUS) ×3 IMPLANT
COVER PROBE FLX POLY STRL (MISCELLANEOUS) ×2 IMPLANT
DEVICE DUBIN SPECIMEN MAMMOGRA (MISCELLANEOUS) IMPLANT
DRAIN CHANNEL JP 15F RND 16 (MISCELLANEOUS) ×2 IMPLANT
DRAPE LAPAROTOMY TRNSV 106X77 (MISCELLANEOUS) ×2 IMPLANT
DRSG GAUZE FLUFF 36X18 (GAUZE/BANDAGES/DRESSINGS) ×2 IMPLANT
DRSG TELFA 3X8 NADH (GAUZE/BANDAGES/DRESSINGS) ×2 IMPLANT
ELECT CAUTERY BLADE TIP 2.5 (TIP) ×2
ELECT REM PT RETURN 9FT ADLT (ELECTROSURGICAL) ×2
ELECTRODE CAUTERY BLDE TIP 2.5 (TIP) ×1 IMPLANT
ELECTRODE REM PT RTRN 9FT ADLT (ELECTROSURGICAL) ×1 IMPLANT
GAUZE SPONGE 4X4 12PLY STRL (GAUZE/BANDAGES/DRESSINGS) ×2 IMPLANT
GLOVE BIO SURGEON STRL SZ7.5 (GLOVE) ×2 IMPLANT
GLOVE INDICATOR 8.0 STRL GRN (GLOVE) ×2 IMPLANT
GOWN STRL REUS W/ TWL LRG LVL3 (GOWN DISPOSABLE) ×2 IMPLANT
GOWN STRL REUS W/TWL LRG LVL3 (GOWN DISPOSABLE) ×2
KIT TURNOVER KIT A (KITS) ×2 IMPLANT
LABEL OR SOLS (LABEL) ×2 IMPLANT
NDL SAFETY ECLIPSE 18X1.5 (NEEDLE) ×1 IMPLANT
NEEDLE HYPO 18GX1.5 SHARP (NEEDLE) ×1
NEEDLE HYPO 22GX1.5 SAFETY (NEEDLE) ×2 IMPLANT
PACK BASIN MINOR ARMC (MISCELLANEOUS) ×2 IMPLANT
PIN SAFETY STRL (MISCELLANEOUS) ×2 IMPLANT
RETRACTOR RING XSMALL (MISCELLANEOUS) IMPLANT
RTRCTR WOUND ALEXIS 13CM XS SH (MISCELLANEOUS)
SHEARS FOC LG CVD HARMONIC 17C (MISCELLANEOUS) IMPLANT
SLEVE PROBE SENORX GAMMA FIND (MISCELLANEOUS) ×2 IMPLANT
SPONGE LAP 18X18 RF (DISPOSABLE) ×2 IMPLANT
STRIP CLOSURE SKIN 1/2X4 (GAUZE/BANDAGES/DRESSINGS) ×4 IMPLANT
SUT ETHILON 3-0 FS-10 30 BLK (SUTURE) ×2
SUT SILK 0 (SUTURE) ×1
SUT SILK 0 30XBRD TIE 6 (SUTURE) ×1 IMPLANT
SUT VIC AB 2-0 CT1 27 (SUTURE) ×3
SUT VIC AB 2-0 CT1 TAPERPNT 27 (SUTURE) ×3 IMPLANT
SUT VIC AB 3-0 SH 27 (SUTURE) ×1
SUT VIC AB 3-0 SH 27X BRD (SUTURE) ×1 IMPLANT
SUT VICRYL+ 3-0 144IN (SUTURE) ×2 IMPLANT
SUTURE EHLN 3-0 FS-10 30 BLK (SUTURE) ×1 IMPLANT
SWABSTK COMLB BENZOIN TINCTURE (MISCELLANEOUS) ×2 IMPLANT
SYR 10ML LL (SYRINGE) ×2 IMPLANT
TAPE TRANSPORE STRL 2 31045 (GAUZE/BANDAGES/DRESSINGS) ×2 IMPLANT

## 2017-06-13 NOTE — Anesthesia Preprocedure Evaluation (Signed)
Anesthesia Evaluation  Patient identified by MRN, date of birth, ID band Patient awake    Reviewed: Allergy & Precautions, NPO status , Patient's Chart, lab work & pertinent test results, reviewed documented beta blocker date and time   History of Anesthesia Complications (+) Family history of anesthesia reaction and history of anesthetic complications  Airway Mallampati: III  TM Distance: >3 FB     Dental  (+) Chipped   Pulmonary           Cardiovascular hypertension, Pt. on medications and Pt. on home beta blockers + Valvular Problems/Murmurs      Neuro/Psych  Headaches,  Neuromuscular disease    GI/Hepatic hiatal hernia, GERD  Controlled,  Endo/Other  diabetes  Renal/GU      Musculoskeletal  (+) Arthritis ,   Abdominal   Peds  Hematology  (+) anemia ,   Anesthesia Other Findings   Reproductive/Obstetrics                             Anesthesia Physical Anesthesia Plan  ASA: III  Anesthesia Plan: General   Post-op Pain Management:    Induction: Intravenous  PONV Risk Score and Plan:   Airway Management Planned: LMA  Additional Equipment:   Intra-op Plan:   Post-operative Plan:   Informed Consent: I have reviewed the patients History and Physical, chart, labs and discussed the procedure including the risks, benefits and alternatives for the proposed anesthesia with the patient or authorized representative who has indicated his/her understanding and acceptance.     Plan Discussed with: CRNA  Anesthesia Plan Comments:         Anesthesia Quick Evaluation

## 2017-06-13 NOTE — H&P (Signed)
Patient with high grade DCIS. Has decided on mastectomy. Tolerated SLN injection well. For left simple mastectomy and SLN biopsy.

## 2017-06-13 NOTE — Anesthesia Postprocedure Evaluation (Signed)
Anesthesia Post Note  Patient: Ashley Ryan  Procedure(s) Performed: SIMPLE MASTECTOMY (Left ) SENTINEL NODE BIOPSY (Left )  Patient location during evaluation: PACU Anesthesia Type: General Level of consciousness: awake and alert Pain management: pain level controlled Vital Signs Assessment: post-procedure vital signs reviewed and stable Respiratory status: spontaneous breathing, nonlabored ventilation, respiratory function stable and patient connected to nasal cannula oxygen Cardiovascular status: blood pressure returned to baseline and stable Postop Assessment: no apparent nausea or vomiting Anesthetic complications: no     Last Vitals:  Vitals:   06/13/17 1222 06/13/17 1233  BP: (!) 144/66 135/68  Pulse: 62 62  Resp: (!) 21 13  Temp:  36.6 C  SpO2: 97% 94%    Last Pain:  Vitals:   06/13/17 1233  TempSrc:   PainSc: 0-No pain                 Joneric Streight S

## 2017-06-13 NOTE — Progress Notes (Signed)
JP teaching done with daughter

## 2017-06-13 NOTE — Progress Notes (Signed)
Gauze dressing in place with breast binder in place and JP drain with minimal drainage

## 2017-06-13 NOTE — Op Note (Signed)
Preoperative diagnosis: Left breast DCIS.  Postoperative diagnosis: Same.  Operative procedure: Left simple mastectomy with sentinel node biopsy.  Operating Surgeon: Hervey Ard, MD.  Anesthesia: General by LMA.  Estimated blood loss: Less than 15 cc.  Clinical note: This 72 year old woman was recently identified with high-grade DCIS.  Given options for management with wide local excision and postoperative radiation versus mastectomy the patient is chosen simple mastectomy.  Sentinel node biopsy was planned in the event that the tumor was upstaged.  The patient underwent injection with technetium sulfur colloid prior to the procedure which she tolerated well.  Operative note: The patient underwent general anesthesia without difficulty.  She received Kefzol prior to the procedure.  The breast chest and axilla was cleansed with ChloraPrep after the injection of 5 cc of 0.5% methylene blue in the subareolar plexus.  Elliptical incision was outlined and the skin incised sharply.  The remaining dissection was completed with the photon blade.  Margins of resection with a sternum medially, clavicle superiorly, serratus muscle laterally and the rectus fascia inferiorly.  The breast was elevated off the underlying pectoralis muscle taking the fascia of the muscle with the specimen.  Intercostal vessels were ligated with 3-0 Vicryl ties.  The axillary envelope was opened in a single hot, blue node was identified and sent in formalin for routine histology.  The wound was irrigated with sterile water.  A single 15 French drain was brought out through the inferior medial flap and anchored into position with a 3-0 nylon suture. The flaps were approximated with a running 2-0 Vicryl deep dermal suture in 2 segments.  Benzoin and Steri-Strips were applied. Fluff gauze followed by a compressive wrap were applied.  The drain was placed to self suction.  The patient tolerated the procedure well and was taken to  the recovery room in stable condition.

## 2017-06-13 NOTE — Anesthesia Post-op Follow-up Note (Signed)
Anesthesia QCDR form completed.        

## 2017-06-13 NOTE — Discharge Instructions (Signed)

## 2017-06-13 NOTE — Transfer of Care (Signed)
Immediate Anesthesia Transfer of Care Note  Patient: Ashley Ryan  Procedure(s) Performed: SIMPLE MASTECTOMY (Left ) SENTINEL NODE BIOPSY (Left )  Patient Location: PACU  Anesthesia Type:General  Level of Consciousness: sedated  Airway & Oxygen Therapy: Patient Spontanous Breathing and Patient connected to face mask oxygen  Post-op Assessment: Report given to RN and Post -op Vital signs reviewed and stable  Post vital signs: Reviewed and stable  Last Vitals:  Vitals Value Taken Time  BP 140/67 06/13/2017 11:45 AM  Temp 36.4 C 06/13/2017 11:45 AM  Pulse 66 06/13/2017 11:45 AM  Resp 14 06/13/2017 11:45 AM  SpO2 100 % 06/13/2017 11:45 AM  Vitals shown include unvalidated device data.  Last Pain:  Vitals:   06/13/17 0901  TempSrc: Oral         Complications: No apparent anesthesia complications

## 2017-06-18 ENCOUNTER — Telehealth: Payer: Self-pay | Admitting: *Deleted

## 2017-06-18 ENCOUNTER — Ambulatory Visit (INDEPENDENT_AMBULATORY_CARE_PROVIDER_SITE_OTHER): Payer: Medicare HMO | Admitting: *Deleted

## 2017-06-18 DIAGNOSIS — D0512 Intraductal carcinoma in situ of left breast: Secondary | ICD-10-CM

## 2017-06-18 NOTE — Telephone Encounter (Signed)
-----   Message from Robert Bellow, MD sent at 06/18/2017  3:52 PM EDT ----- Please notify margins clear and lymph node OK. Thanks.  ----- Message ----- From: Interface, Lab In Three Zero One Sent: 06/18/2017   2:20 PM To: Robert Bellow, MD

## 2017-06-18 NOTE — Patient Instructions (Signed)
The patient is aware to call back for any questions or concerns.  

## 2017-06-18 NOTE — Telephone Encounter (Signed)
Notified patient as instructed, patient pleased. Discussed follow-up appointments, patient agrees  

## 2017-06-18 NOTE — Progress Notes (Signed)
Patient ID: Ashley Ryan, female   DOB: [birth year], 72 y.o.   MRN: [id number]  Patient came in today for a wound check post left mastectomy.  The wound is clean, with no signs of infection noted. Drainage is 30-50 ml a day. She states she is "uncomfortable", but no pain. Minimal bruising.  Fluff gauze and compression bra reapplied. Safety reviewed with showering. Follow up as scheduled.

## 2017-06-19 ENCOUNTER — Other Ambulatory Visit: Payer: Self-pay

## 2017-06-22 ENCOUNTER — Other Ambulatory Visit: Payer: Self-pay | Admitting: General Surgery

## 2017-06-23 ENCOUNTER — Encounter: Payer: Self-pay | Admitting: Genetics

## 2017-06-23 LAB — SURGICAL PATHOLOGY

## 2017-06-24 ENCOUNTER — Ambulatory Visit (INDEPENDENT_AMBULATORY_CARE_PROVIDER_SITE_OTHER): Payer: Medicare HMO | Admitting: General Surgery

## 2017-06-24 ENCOUNTER — Encounter: Payer: Self-pay | Admitting: General Surgery

## 2017-06-24 VITALS — BP 134/66 | HR 68 | Resp 14 | Ht 61.0 in | Wt 154.0 lb

## 2017-06-24 DIAGNOSIS — D0512 Intraductal carcinoma in situ of left breast: Secondary | ICD-10-CM

## 2017-06-24 NOTE — Progress Notes (Signed)
Patient ID: Ashley Ryan, female   DOB: [birth year], 72 y.o.   MRN: [id number]  Chief Complaint  Patient presents with  . Routine Post Op    HPI Ashley Ryan is a 72 y.o. female here for a follow up from a left mastectomy done on 06/13/17. Drain sheet present.   HPI  Past Medical History:  Diagnosis Date  . Anemia   . Arthritis   . Chronic headache   . Complication of anesthesia    prior to 1991 used to have PONV.  none recently.  . Diverticulosis of colon   . Family history of adverse reaction to anesthesia    mother/daighter get sick  . GERD with stricture   . Hard of hearing   . Heart murmur    followed by PCP  . Hiatal hernia   . Hypertension   . Neuropathy    feet  . Osteoporosis   . Pre-diabetes   . Wears dentures    full upper    Past Surgical History:  Procedure Laterality Date  . ABDOMINAL HYSTERECTOMY  1978   one ovary remains  . BREAST BIOPSY Left 05/26/2017   Affirm Bx- path pending - coil clip  . CARDIAC CATHETERIZATION     "yrs ago" all OK.  Marland Kitchen CATARACT EXTRACTION W/PHACO Left 02/21/2016   Procedure: CATARACT EXTRACTION PHACO AND INTRAOCULAR LENS PLACEMENT ([place])  Left eye;  Surgeon: Leandrew Koyanagi, MD;  Location: [place];  Service: Ophthalmology;  Laterality: Left;  Left eye Diabetic  . CATARACT EXTRACTION W/PHACO Right 03/13/2016   Procedure: CATARACT EXTRACTION PHACO AND INTRAOCULAR LENS PLACEMENT ([place])  right  diabetic;  Surgeon: Leandrew Koyanagi, MD;  Location: [place];  Service: Ophthalmology;  Laterality: Right;  diabetic  . CHOLECYSTECTOMY  1978  . OOPHORECTOMY     one still left  . SENTINEL NODE BIOPSY Left 06/13/2017   Procedure: SENTINEL NODE BIOPSY;  Surgeon: Robert Bellow, MD;  Location: [facility];  Service: General;  Laterality: Left;  . SIMPLE MASTECTOMY WITH AXILLARY SENTINEL NODE BIOPSY Left 06/13/2017   Procedure: SIMPLE MASTECTOMY;  Surgeon: Robert Bellow, MD;  Location: [facility];  Service:  General;  Laterality: Left;    Family History  Problem Relation Age of Onset  . Breast cancer Unknown   . Stomach cancer Unknown   . Diabetes Unknown   . Heart disease Unknown   . Breast cancer Sister 86  . Esophageal cancer Brother     Social History Social History   Tobacco Use  . Smoking status: Never Smoker  . Smokeless tobacco: Never Used  Substance Use Topics  . Alcohol use: No  . Drug use: No    Allergies  Allergen Reactions  . Sulfa Antibiotics Rash    Childhood reaction  . Sulfonamide Derivatives Rash    Childhood reaction.    Current Outpatient Medications  Medication Sig Dispense Refill  . ACCU-CHEK SOFTCLIX LANCETS lancets Use to check blood sugar once daily 100 each 3  . Alcohol Swabs (B-D SINGLE USE SWABS REGULAR) PADS Use to check blood sugar once daily 100 each 3  . amLODipine (NORVASC) 5 MG tablet Take 1 tablet (5 mg total) by mouth daily. 90 tablet 3  . aspirin EC 81 MG tablet Take 81 mg by mouth daily.     Marland Kitchen atenolol (TENORMIN) 100 MG tablet TAKE 1 TABLET EVERY DAY 90 tablet 1  . atorvastatin (LIPITOR) 40 MG tablet TAKE 1 TABLET EVERY DAY (Patient taking differently:  TAKE 1 TABLET EVERY DAY at bedtime) 90 tablet 1  . Blood Glucose Calibration (ACCU-CHEK AVIVA) SOLN Use to check blood sugar once daily 1 each 0  . Blood Glucose Monitoring Suppl (ACCU-CHEK AVIVA PLUS) W/DEVICE KIT Use to check blood sugar once daily 1 kit 0  . Calcium Carbonate-Vitamin D (CALCIUM 600+D) 600-400 MG-UNIT per tablet Take 1 tablet by mouth 2 (two) times daily.     . Calcium Polycarbophil (FIBER-CAPS PO) Take 1 capsule by mouth daily.    Marland Kitchen esomeprazole (NEXIUM) 40 MG capsule TAKE 1 CAPSULE 2 (TWO) TIMES DAILY BEFORE A MEAL. 180 capsule 1  . FLUoxetine (PROZAC) 20 MG capsule TAKE 1 CAPSULE EVERY DAY 90 capsule 1  . fluticasone (FLONASE) 50 MCG/ACT nasal spray PLACE 2 SPRAYS INTO THE NOSE DAILY AS DIRECTED 48 g 3  . gabapentin (NEURONTIN) 100 MG capsule TAKE 1 CAPSULE IN THE  MORNING, 1 CAPSULE AT LUNCH, 1 CAPSULES AT DINNER AND 2 TO 3 CAPSULES AT BEDTIME 540 capsule 5  . Glucosamine 500 MG TABS Take 500 mg by mouth daily.     Marland Kitchen glucose blood (ACCU-CHEK AVIVA) test strip Use to check blood sugar once daily 100 each 3  . latanoprost (XALATAN) 0.005 % ophthalmic solution Place 1 drop into both eyes at bedtime.    . lidocaine (LIDODERM) 5 % APPLY 1 PATCH ONTO THE SKIN EVERY DAY. REMOVE AND DISCARD PATCH WITHIN 12 HOURS OR AS DIRECTED BY PHYSICIAN. 90 patch 0  . lidocaine-prilocaine (EMLA) cream Apply 1 application topically as needed. Apply to areola (colored part of breast around the nipple) one hour before coming to the hospital. 5 g 0  . loratadine (CLARITIN) 10 MG tablet Take 10 mg by mouth daily.    Marland Kitchen losartan-hydrochlorothiazide (HYZAAR) 100-25 MG tablet TAKE 1 TABLET EVERY DAY (Patient taking differently: TAKE 1 TABLET EVERY DAY AT BEDTIME.) 90 tablet 1  . Omega-3 Fatty Acids (FISH OIL) 1000 MG CAPS Take 1,000 mg by mouth daily.     . phenylephrine (SUDAFED PE) 10 MG TABS tablet Take 10 mg by mouth every 4 (four) hours as needed (for nasal congestion.).    Marland Kitchen Potassium Chloride ER 20 MEQ TBCR Take 1 tablet by mouth daily. 20 tablet 0  . traMADol (ULTRAM) 50 MG tablet TAKE 1 TABLET EVERY 6 HOURS AS NEEDED (Patient taking differently: TAKE 1 TABLET EVERY 6 HOURS AS NEEDED FOR PAIN.) 90 tablet 0  . vitamin B-12 (CYANOCOBALAMIN) 1000 MCG tablet Take 1,000 mcg by mouth daily.     No current facility-administered medications for this visit.     Review of Systems Review of Systems  Constitutional: Negative.   Respiratory: Negative.   Cardiovascular: Negative.     Blood pressure 134/66, pulse 68, resp. rate 14, height '5\' 1"'$  (1.549 m), weight 154 lb (69.9 kg).  Physical Exam Physical Exam  Constitutional: She is oriented to person, place, and time. She appears well-developed and well-nourished.  Neurological: She is alert and oriented to person, place, and time.   Skin: Skin is warm and dry.  Drain removed.   Data Reviewed  Jun 13, 2017 pathology review:  DIAGNOSIS:  A. LEFT BREAST; SIMPLE MASTECTOMY:  - DUCTAL CARCINOMA IN SITU, NUCLEAR GRADE 3.  - THE SURGICAL MARGINS ARE NEGATIVE.  - BIOPSY SITE CHANGES, MARKER CLIP PRESENT.  - BACKGROUND FIBROCYSTIC CHANGE.  - SEE SUMMARY BELOW.   B. SENTINEL LYMPH NODE #1; EXCISION:  - NO TUMOR SEEN IN ONE LYMPH NODE (0/1).   Estrogen  Receptor (ER) Status: POSITIVE  Percentage of cells with nuclear positivity: >90%  Average intensity of staining: Strong   Progesterone Receptor (PgR) Status: POSITIVE  Percentage of cells with nuclear positivity: 75%  Average intensity of staining: Moderate   Jun 02, 2017 bone density: ASSESSMENT: The BMD measured at AP Spine L1-L4 is 0.930 g/cm2 with a T-score of -2.1. This patient is considered osteopenic according to [facility]) criteria. 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  Osteopenia.    Plan   The patient was evaluated by Nolon Stalls, MD prior to surgery.  Will contact her office to see how she would like to arrange follow-up with the patient.  With the finding of DCIS as well as osteopenia she might be best managed with tamoxifen rather than an aromatase inhibitor.  The potential for recurrent fluid accumulation post drain removal was discussed.  Return in one week.   The patient had been making use of calcium supplements 600 mg daily.  She is been asked to increase her calcium two times daily.  The patient is aware to call back for any questions or concerns.   HPI, Physical Exam, Assessment and Plan have been scribed under the direction and in the presence of Hervey Ard, MD.  Gaspar Cola, CMA  I have completed the exam and reviewed the above documentation for accuracy and completeness.  I agree with the above.  Haematologist has been used and any errors in dictation or transcription are unintentional.  Hervey Ard, M.D., F.A.C.S.  Ashley Ryan 06/24/2017, 8:24 PM

## 2017-06-24 NOTE — Patient Instructions (Signed)
Return in one week. The patient is aware to call back for any questions or concerns. 

## 2017-06-30 ENCOUNTER — Ambulatory Visit (INDEPENDENT_AMBULATORY_CARE_PROVIDER_SITE_OTHER): Payer: Medicare HMO | Admitting: General Surgery

## 2017-06-30 ENCOUNTER — Encounter: Payer: Self-pay | Admitting: General Surgery

## 2017-06-30 VITALS — BP 128/74 | HR 72 | Resp 14 | Ht 61.0 in | Wt 155.0 lb

## 2017-06-30 DIAGNOSIS — D0512 Intraductal carcinoma in situ of left breast: Secondary | ICD-10-CM

## 2017-06-30 NOTE — Progress Notes (Signed)
Patient ID: Ashley Ryan, female   DOB: [birth year], 72 y.o.   MRN: [id number]  Chief Complaint  Patient presents with  . Follow-up    HPI Ashley Ryan is a 72 y.o. female here today for her follow up left mastectomy done on 06/13/2017. Patient states there is fluid in that area causing some modest discomfort when she would bring her arm across her chest.   HPI  Past Medical History:  Diagnosis Date  . Anemia   . Arthritis   . Chronic headache   . Complication of anesthesia    prior to 1991 used to have PONV.  none recently.  . Diverticulosis of colon   . Family history of adverse reaction to anesthesia    mother/daighter get sick  . GERD with stricture   . Hard of hearing   . Heart murmur    followed by PCP  . Hiatal hernia   . Hypertension   . Neuropathy    feet  . Osteoporosis   . Pre-diabetes   . Wears dentures    full upper    Past Surgical History:  Procedure Laterality Date  . ABDOMINAL HYSTERECTOMY  1978   one ovary remains  . BREAST BIOPSY Left 05/26/2017   Affirm Bx- path pending - coil clip  . CARDIAC CATHETERIZATION     "yrs ago" all OK.  Marland Kitchen CATARACT EXTRACTION W/PHACO Left 02/21/2016   Procedure: CATARACT EXTRACTION PHACO AND INTRAOCULAR LENS PLACEMENT ([place])  Left eye;  Surgeon: Leandrew Koyanagi, MD;  Location: [place];  Service: Ophthalmology;  Laterality: Left;  Left eye Diabetic  . CATARACT EXTRACTION W/PHACO Right 03/13/2016   Procedure: CATARACT EXTRACTION PHACO AND INTRAOCULAR LENS PLACEMENT ([place])  right  diabetic;  Surgeon: Leandrew Koyanagi, MD;  Location: [facility];  Service: Ophthalmology;  Laterality: Right;  diabetic  . CHOLECYSTECTOMY  1978  . OOPHORECTOMY     one still left  . SENTINEL NODE BIOPSY Left 06/13/2017   Procedure: SENTINEL NODE BIOPSY;  Surgeon: Robert Bellow, MD;  Location: [facility];  Service: General;  Laterality: Left;  . SIMPLE MASTECTOMY WITH AXILLARY SENTINEL NODE BIOPSY Left 06/13/2017    Procedure: SIMPLE MASTECTOMY;  Surgeon: Robert Bellow, MD;  Location: [facility];  Service: General;  Laterality: Left;    Family History  Problem Relation Age of Onset  . Breast cancer Unknown   . Stomach cancer Unknown   . Diabetes Unknown   . Heart disease Unknown   . Breast cancer Sister 73  . Esophageal cancer Brother     Social History Social History   Tobacco Use  . Smoking status: Never Smoker  . Smokeless tobacco: Never Used  Substance Use Topics  . Alcohol use: No  . Drug use: No    Allergies  Allergen Reactions  . Sulfa Antibiotics Rash    Childhood reaction  . Sulfonamide Derivatives Rash    Childhood reaction.    Current Outpatient Medications  Medication Sig Dispense Refill  . ACCU-CHEK SOFTCLIX LANCETS lancets Use to check blood sugar once daily 100 each 3  . Alcohol Swabs (B-D SINGLE USE SWABS REGULAR) PADS Use to check blood sugar once daily 100 each 3  . amLODipine (NORVASC) 5 MG tablet Take 1 tablet (5 mg total) by mouth daily. 90 tablet 3  . aspirin EC 81 MG tablet Take 81 mg by mouth daily.     Marland Kitchen atenolol (TENORMIN) 100 MG tablet TAKE 1 TABLET EVERY DAY 90 tablet 1  .  atorvastatin (LIPITOR) 40 MG tablet TAKE 1 TABLET EVERY DAY (Patient taking differently: TAKE 1 TABLET EVERY DAY at bedtime) 90 tablet 1  . Blood Glucose Calibration (ACCU-CHEK AVIVA) SOLN Use to check blood sugar once daily 1 each 0  . Blood Glucose Monitoring Suppl (ACCU-CHEK AVIVA PLUS) W/DEVICE KIT Use to check blood sugar once daily 1 kit 0  . Calcium Carbonate-Vitamin D (CALCIUM 600+D) 600-400 MG-UNIT per tablet Take 1 tablet by mouth 2 (two) times daily.     . Calcium Polycarbophil (FIBER-CAPS PO) Take 1 capsule by mouth daily.    Marland Kitchen esomeprazole (NEXIUM) 40 MG capsule TAKE 1 CAPSULE 2 (TWO) TIMES DAILY BEFORE A MEAL. 180 capsule 1  . FLUoxetine (PROZAC) 20 MG capsule TAKE 1 CAPSULE EVERY DAY 90 capsule 1  . fluticasone (FLONASE) 50 MCG/ACT nasal spray PLACE 2 SPRAYS INTO THE  NOSE DAILY AS DIRECTED 48 g 3  . gabapentin (NEURONTIN) 100 MG capsule TAKE 1 CAPSULE IN THE MORNING, 1 CAPSULE AT LUNCH, 1 CAPSULES AT DINNER AND 2 TO 3 CAPSULES AT BEDTIME 540 capsule 5  . Glucosamine 500 MG TABS Take 500 mg by mouth daily.     Marland Kitchen glucose blood (ACCU-CHEK AVIVA) test strip Use to check blood sugar once daily 100 each 3  . latanoprost (XALATAN) 0.005 % ophthalmic solution Place 1 drop into both eyes at bedtime.    . lidocaine (LIDODERM) 5 % APPLY 1 PATCH ONTO THE SKIN EVERY DAY. REMOVE AND DISCARD PATCH WITHIN 12 HOURS OR AS DIRECTED BY PHYSICIAN. 90 patch 0  . lidocaine-prilocaine (EMLA) cream Apply 1 application topically as needed. Apply to areola (colored part of breast around the nipple) one hour before coming to the hospital. 5 g 0  . loratadine (CLARITIN) 10 MG tablet Take 10 mg by mouth daily.    Marland Kitchen losartan-hydrochlorothiazide (HYZAAR) 100-25 MG tablet TAKE 1 TABLET EVERY DAY (Patient taking differently: TAKE 1 TABLET EVERY DAY AT BEDTIME.) 90 tablet 1  . Omega-3 Fatty Acids (FISH OIL) 1000 MG CAPS Take 1,000 mg by mouth daily.     . phenylephrine (SUDAFED PE) 10 MG TABS tablet Take 10 mg by mouth every 4 (four) hours as needed (for nasal congestion.).    Marland Kitchen Potassium Chloride ER 20 MEQ TBCR Take 1 tablet by mouth daily. 20 tablet 0  . traMADol (ULTRAM) 50 MG tablet TAKE 1 TABLET EVERY 6 HOURS AS NEEDED (Patient taking differently: TAKE 1 TABLET EVERY 6 HOURS AS NEEDED FOR PAIN.) 90 tablet 0  . vitamin B-12 (CYANOCOBALAMIN) 1000 MCG tablet Take 1,000 mcg by mouth daily.     No current facility-administered medications for this visit.     Review of Systems Review of Systems  Constitutional: Negative.   Cardiovascular: Negative.     Blood pressure 128/74, pulse 72, resp. rate 14, height _0  (1.549 m), weight 155 lb (70.3 kg).  Physical Exam Physical Exam  Constitutional: She is oriented to person, place, and time. She appears well-developed and well-nourished.   Neurological: She is alert and oriented to person, place, and time.  Skin: Skin is warm and dry.  Minimal evidence of seroma. Chloraprep, 1 cc xylocaine. Drained 38 ml of fluid.     Assessment    Minimal seroma.     Plan  Heating pad for comfort.  Care to be exercised so as to not burn skin. (positioning discussed).  Return in one week. The patient is aware to call back for any questions or concerns.  HPI, Physical  Exam, Assessment and Plan have been scribed under the direction and in the presence of Hervey Ard, MD.  Gaspar Cola, CMA  I have completed the exam and reviewed the above documentation for accuracy and completeness.  I agree with the above.  Haematologist has been used and any errors in dictation or transcription are unintentional.  Hervey Ard, M.D., F.A.C.S.  Gaspar Cola 06/30/2017, 3:58 PM

## 2017-06-30 NOTE — Patient Instructions (Addendum)
  Return in one week.The patient is aware to use a heating pad as needed for comfort.  The patient is aware to call back for any questions or concerns.

## 2017-07-03 ENCOUNTER — Ambulatory Visit: Payer: Medicare HMO | Admitting: General Surgery

## 2017-07-03 ENCOUNTER — Inpatient Hospital Stay: Payer: Medicare HMO

## 2017-07-03 ENCOUNTER — Encounter: Payer: Self-pay | Admitting: Hematology and Oncology

## 2017-07-03 ENCOUNTER — Other Ambulatory Visit: Payer: Self-pay

## 2017-07-03 ENCOUNTER — Inpatient Hospital Stay: Payer: Medicare HMO | Attending: Hematology and Oncology | Admitting: Hematology and Oncology

## 2017-07-03 VITALS — BP 126/74 | HR 57 | Temp 98.2°F | Resp 18 | Wt 154.6 lb

## 2017-07-03 DIAGNOSIS — E538 Deficiency of other specified B group vitamins: Secondary | ICD-10-CM

## 2017-07-03 DIAGNOSIS — Z170 Estrogen receptor positive status [ER+]: Secondary | ICD-10-CM

## 2017-07-03 DIAGNOSIS — D649 Anemia, unspecified: Secondary | ICD-10-CM | POA: Insufficient documentation

## 2017-07-03 DIAGNOSIS — M8588 Other specified disorders of bone density and structure, other site: Secondary | ICD-10-CM | POA: Diagnosis not present

## 2017-07-03 DIAGNOSIS — D0512 Intraductal carcinoma in situ of left breast: Secondary | ICD-10-CM

## 2017-07-03 DIAGNOSIS — Z9012 Acquired absence of left breast and nipple: Secondary | ICD-10-CM | POA: Diagnosis not present

## 2017-07-03 LAB — HEPATIC FUNCTION PANEL
ALT: 13 U/L — AB (ref 14–54)
AST: 17 U/L (ref 15–41)
Albumin: 3.8 g/dL (ref 3.5–5.0)
Alkaline Phosphatase: 67 U/L (ref 38–126)
Bilirubin, Direct: <0.1 mg/dL — ABNORMAL LOW (ref 0.1–0.5)
TOTAL PROTEIN: 6.7 g/dL (ref 6.5–8.1)
Total Bilirubin: 0.7 mg/dL (ref 0.3–1.2)

## 2017-07-03 LAB — VITAMIN B12: VITAMIN B 12: 299 pg/mL (ref 180–914)

## 2017-07-03 MED ORDER — TAMOXIFEN CITRATE 20 MG PO TABS: 20.0000 mg | ORAL_TABLET | Freq: Every day | ORAL | 0 refills | Status: DC

## 2017-07-03 NOTE — Patient Instructions (Signed)
Tamoxifen oral tablet What is this medicine? TAMOXIFEN (ta MOX i fen) blocks the effects of estrogen. It is commonly used to treat breast cancer. It is also used to decrease the chance of breast cancer coming back in women who have received treatment for the disease. It may also help prevent breast cancer in women who have a high risk of developing breast cancer. This medicine may be used for other purposes; ask your health care provider or pharmacist if you have questions. COMMON BRAND NAME(S): Nolvadex What should I tell my health care provider before I take this medicine? They need to know if you have any of these conditions: -blood clots -blood disease -cataracts or impaired eyesight -endometriosis -high calcium levels -high cholesterol -irregular menstrual cycles -liver disease -stroke -uterine fibroids -an unusual reaction to tamoxifen, other medicines, foods, dyes, or preservatives -pregnant or trying to get pregnant -breast-feeding How should I use this medicine? Take this medicine by mouth with a glass of water. Follow the directions on the prescription label. You can take it with or without food. Take your medicine at regular intervals. Do not take your medicine more often than directed. Do not stop taking except on your doctor's advice. A special MedGuide will be given to you by the pharmacist with each prescription and refill. Be sure to read this information carefully each time. Talk to your pediatrician regarding the use of this medicine in children. While this drug may be prescribed for selected conditions, precautions do apply. Overdosage: If you think you have taken too much of this medicine contact a poison control center or emergency room at once. NOTE: This medicine is only for you. Do not share this medicine with others. What if I miss a dose? If you miss a dose, take it as soon as you can. If it is almost time for your next dose, take only that dose. Do not take  double or extra doses. What may interact with this medicine? Do not take this medicine with any of the following medications: -cisapride -certain medicines for irregular heart beat like dofetilide, dronedarone, quinidine -certain medicines for fungal infection like fluconazole, posaconazole -pimozide -saquinavir -thioridazine This medicine may also interact with the following medications: -aminoglutethimide -anastrozole -bromocriptine -chemotherapy drugs -female hormones, like estrogens and birth control pills -letrozole -medroxyprogesterone -phenobarbital -rifampin -warfarin This list may not describe all possible interactions. Give your health care provider a list of all the medicines, herbs, non-prescription drugs, or dietary supplements you use. Also tell them if you smoke, drink alcohol, or use illegal drugs. Some items may interact with your medicine. What should I watch for while using this medicine? Visit your doctor or health care professional for regular checks on your progress. You will need regular pelvic exams, breast exams, and mammograms. If you are taking this medicine to reduce your risk of getting breast cancer, you should know that this medicine does not prevent all types of breast cancer. If breast cancer or other problems occur, there is no guarantee that it will be found at an early stage. Do not become pregnant while taking this medicine or for 2 months after stopping this medicine. Stop taking this medicine if you get pregnant or think you are pregnant and contact your doctor. This medicine may harm your unborn baby. Women who can possibly become pregnant should use birth control methods that do not use hormones during tamoxifen treatment and for 2 months after therapy has stopped. Talk with your health care provider for birth control advice.  are pregnant and contact your doctor. This medicine may harm your unborn baby. Women who can possibly become pregnant should use birth control methods that do not use hormones during tamoxifen treatment and for 2 months after therapy has stopped. Talk with your health care provider for birth control advice.  Do not breast feed while taking this medicine.  What side effects may I notice from receiving this medicine?  Side effects that  you should report to your doctor or health care professional as soon as possible:  -allergic reactions like skin rash, itching or hives, swelling of the face, lips, or tongue  -changes in vision  -changes in your menstrual cycle  -difficulty walking or talking  -new breast lumps  -numbness  -pelvic pain or pressure  -redness, blistering, peeling or loosening of the skin, including inside the mouth  -signs and symptoms of a dangerous change in heartbeat or heart rhythm like chest pain, dizziness, fast or irregular heartbeat, palpitations, feeling faint or lightheaded, falls, breathing problems  -sudden chest pain  -swelling, pain or tenderness in your calf or leg  -unusual bruising or bleeding  -vaginal discharge that is bloody, brown, or rust  -weakness  -yellowing of the whites of the eyes or skin  Side effects that usually do not require medical attention (report to your doctor or health care professional if they continue or are bothersome):  -fatigue  -hair loss, although uncommon and is usually mild  -headache  -hot flashes  -impotence (in men)  -nausea, vomiting (mild)  -vaginal discharge (white or clear)  This list may not describe all possible side effects. Call your doctor for medical advice about side effects. You may report side effects to FDA at 1-800-FDA-1088.  Where should I keep my medicine?  Keep out of the reach of children.  Store at room temperature between 20 and 25 degrees C (68 and 77 degrees F). Protect from light. Keep container tightly closed. Throw away any unused medicine after the expiration date.  NOTE: This sheet is a summary. It may not cover all possible information. If you have questions about this medicine, talk to your doctor, pharmacist, or health care provider.   2018 [employer]/Gold Standard (2015-07-28 07:27:41)

## 2017-07-03 NOTE — Progress Notes (Signed)
[facility] day:  07/03/2017   Chief Complaint: Ashley Ryan is a 72 y.o. female with left breast DCIS s/p mastectomy who is seen for assessment prior to initiation of hormonal therapy.  HPI:  The patient was last seen in the medical oncology clinic on 06/02/2017 for initial consultation.  She had undergone left breast biopsy.  Pathology revealed DCIS.  We discussed management of DCIS.    Labs revealed a hematocrit of 32.2, hemoglobin 10.5, MCV 83.7, platelets 179,000, white count 4800 with an ANC of 2000.  Ferritin was 43.  Iron saturation 12% with a TIBC of 275.  Folate was 9.6.    Bone density on 06/02/2017 revealed osteopenia with a T-score of -2.1 in the AP spine L1-L4 and -1.9 in the right femoral neck.  She underwent simple mastectomy on 06/13/2017 by Dr. Bary Castilla.  Pathology revealed at least 8 mm of grade III DCIS.  Margins were negative.  One sentinel lymph node was negative.  ER was + (> 90%) and PR + (75%).  Pathologic stage was Tis N0.  She was seen in follow-up on 06/30/2017 by Dr. Bary Castilla.  She had a minimal seroma.  Heating pad was recommended for comfort.  During the interim, patient is doing well overall. She sustained a mechanical fall in her home this morning. She notes that she fell on her knees on a hardwood floor. She denies resulting trauma.   Patient denies that she has experienced any B symptoms. She denies any interval infections. Patient maintains an adequate appetite, and notes that she is eating well. Weight, compared to her last visit to the clinic, has decreased by 6 by pounds.   Patient has minimal post-operative pain, rated 2/10, in the clinic today.   Past Medical History:  Diagnosis Date  . Anemia   . Arthritis   . Chronic headache   . Complication of anesthesia    prior to 1991 used to have PONV.  none recently.  . Diverticulosis of colon   . Family history of adverse reaction to anesthesia     mother/daighter get sick  . GERD with stricture   . Hard of hearing   . Heart murmur    followed by PCP  . Hiatal hernia   . Hypertension   . Neuropathy    feet  . Osteoporosis   . Pre-diabetes   . Wears dentures    full upper    Past Surgical History:  Procedure Laterality Date  . ABDOMINAL HYSTERECTOMY  1978   one ovary remains  . BREAST BIOPSY Left 05/26/2017   Affirm Bx- path pending - coil clip  . CARDIAC CATHETERIZATION     "yrs ago" all OK.  Marland Kitchen CATARACT EXTRACTION W/PHACO Left 02/21/2016   Procedure: CATARACT EXTRACTION PHACO AND INTRAOCULAR LENS PLACEMENT ([place])  Left eye;  Surgeon: Leandrew Koyanagi, MD;  Location: [facility];  Service: Ophthalmology;  Laterality: Left;  Left eye Diabetic  . CATARACT EXTRACTION W/PHACO Right 03/13/2016   Procedure: CATARACT EXTRACTION PHACO AND INTRAOCULAR LENS PLACEMENT ([place])  right  diabetic;  Surgeon: Leandrew Koyanagi, MD;  Location: [facility];  Service: Ophthalmology;  Laterality: Right;  diabetic  . CHOLECYSTECTOMY  1978  . OOPHORECTOMY     one still left  . SENTINEL NODE BIOPSY Left 06/13/2017   Procedure: SENTINEL NODE BIOPSY;  Surgeon: Robert Bellow, MD;  Location: [facility];  Service: General;  Laterality: Left;  . SIMPLE MASTECTOMY WITH AXILLARY  SENTINEL NODE BIOPSY Left 06/13/2017   Procedure: SIMPLE MASTECTOMY;  Surgeon: Robert Bellow, MD;  Location: [facility];  Service: General;  Laterality: Left;    Family History  Problem Relation Age of Onset  . Breast cancer Unknown   . Stomach cancer Unknown   . Diabetes Unknown   . Heart disease Unknown   . Breast cancer Sister 65  . Esophageal cancer Brother     Social History:  reports that she has never smoked. She has never used smokeless tobacco. She reports that she does not drink alcohol or use drugs.  Her husband's name is Scientist, research (medical).  She is a retired [employer] manager.  She lives in [place].  The patient is alone today.  Allergies:  Allergies  Allergen  Reactions  . Sulfa Antibiotics Rash    Childhood reaction  . Sulfonamide Derivatives Rash    Childhood reaction.    Current Medications: Current Outpatient Medications  Medication Sig Dispense Refill  . ACCU-CHEK SOFTCLIX LANCETS lancets Use to check blood sugar once daily 100 each 3  . Alcohol Swabs (B-D SINGLE USE SWABS REGULAR) PADS Use to check blood sugar once daily 100 each 3  . amLODipine (NORVASC) 5 MG tablet Take 1 tablet (5 mg total) by mouth daily. 90 tablet 3  . aspirin EC 81 MG tablet Take 81 mg by mouth daily.     Marland Kitchen atenolol (TENORMIN) 100 MG tablet TAKE 1 TABLET EVERY DAY 90 tablet 1  . atorvastatin (LIPITOR) 40 MG tablet TAKE 1 TABLET EVERY DAY (Patient taking differently: TAKE 1 TABLET EVERY DAY at bedtime) 90 tablet 1  . Blood Glucose Calibration (ACCU-CHEK AVIVA) SOLN Use to check blood sugar once daily 1 each 0  . Blood Glucose Monitoring Suppl (ACCU-CHEK AVIVA PLUS) W/DEVICE KIT Use to check blood sugar once daily 1 kit 0  . Calcium Carbonate-Vitamin D (CALCIUM 600+D) 600-400 MG-UNIT per tablet Take 1 tablet by mouth 2 (two) times daily.     . Calcium Polycarbophil (FIBER-CAPS PO) Take 1 capsule by mouth daily.    Marland Kitchen esomeprazole (NEXIUM) 40 MG capsule TAKE 1 CAPSULE 2 (TWO) TIMES DAILY BEFORE A MEAL. 180 capsule 1  . FLUoxetine (PROZAC) 20 MG capsule TAKE 1 CAPSULE EVERY DAY 90 capsule 1  . fluticasone (FLONASE) 50 MCG/ACT nasal spray PLACE 2 SPRAYS INTO THE NOSE DAILY AS DIRECTED 48 g 3  . gabapentin (NEURONTIN) 100 MG capsule TAKE 1 CAPSULE IN THE MORNING, 1 CAPSULE AT LUNCH, 1 CAPSULES AT DINNER AND 2 TO 3 CAPSULES AT BEDTIME 540 capsule 5  . Glucosamine 500 MG TABS Take 500 mg by mouth daily.     Marland Kitchen glucose blood (ACCU-CHEK AVIVA) test strip Use to check blood sugar once daily 100 each 3  . latanoprost (XALATAN) 0.005 % ophthalmic solution Place 1 drop into both eyes at bedtime.    . lidocaine (LIDODERM) 5 % APPLY 1 PATCH ONTO THE SKIN EVERY DAY. REMOVE AND DISCARD  PATCH WITHIN 12 HOURS OR AS DIRECTED BY PHYSICIAN. 90 patch 0  . loratadine (CLARITIN) 10 MG tablet Take 10 mg by mouth daily.    Marland Kitchen losartan-hydrochlorothiazide (HYZAAR) 100-25 MG tablet TAKE 1 TABLET EVERY DAY (Patient taking differently: TAKE 1 TABLET EVERY DAY AT BEDTIME.) 90 tablet 1  . Omega-3 Fatty Acids (FISH OIL) 1000 MG CAPS Take 1,000 mg by mouth daily.     . phenylephrine (SUDAFED PE) 10 MG TABS tablet Take 10 mg by mouth every 4 (four) hours as needed (for  nasal congestion.).    Marland Kitchen traMADol (ULTRAM) 50 MG tablet TAKE 1 TABLET EVERY 6 HOURS AS NEEDED (Patient taking differently: TAKE 1 TABLET EVERY 6 HOURS AS NEEDED FOR PAIN.) 90 tablet 0  . vitamin B-12 (CYANOCOBALAMIN) 1000 MCG tablet Take 1,000 mcg by mouth daily.     No current facility-administered medications for this visit.     Review of Systems  Constitutional: Positive for weight loss (down 6 pounds). Negative for diaphoresis, fever and malaise/fatigue.       "I'm doing good. I am healing up ok".   HENT: Negative for nosebleeds and sore throat.        "I have bad teeth"  Eyes: Negative.   Respiratory: Negative for cough, hemoptysis, sputum production and shortness of breath.   Cardiovascular: Negative for chest pain, palpitations, orthopnea, leg swelling and PND.  Gastrointestinal: Negative for abdominal pain, blood in stool, constipation, diarrhea, melena, nausea and vomiting.  Genitourinary: Negative for dysuria, frequency, hematuria and urgency.  Musculoskeletal: Positive for joint pain. Negative for back pain, falls and myalgias.       Left knee aching.  Skin: Negative for itching and rash.       LEFT mastectomy; healing well with no signs of infection  Neurological: Negative for dizziness, tremors, weakness and headaches.  Endo/Heme/Allergies: Does not bruise/bleed easily.  Psychiatric/Behavioral: Negative for depression, memory loss and suicidal ideas. The patient is not nervous/anxious and does not have insomnia.    All other systems reviewed and are negative.  Performance status (ECOG): 1 - Symptomatic but completely ambulatory    Physical Exam: Blood pressure 126/74, pulse (!) 57, temperature 98.2 F (36.8 C), temperature source Tympanic, resp. rate 18, weight 154 lb 9 oz (70.1 kg). GENERAL:  Well developed, well nourished, woman sitting comfortably in the exam room in no acute distress. MENTAL STATUS:  Alert and oriented to person, place and time. HEAD:  Short gray hair.  Normocephalic, atraumatic, face symmetric, no Cushingoid features. EYES:  Glasses.  Blue eyes.  Pupils equal round and reactive to light and accomodation.  No conjunctivitis or scleral icterus. ENT:  Oropharynx clear without lesion.  Tongue normal.  Upper plate.  Mucous membranes moist.  RESPIRATORY:  Clear to auscultation without rales, wheezes or rhonchi. CARDIOVASCULAR:  Regular rate and rhythm without murmur, rub or gallop. BREAST:  Right breast without masses, skin changes or nipple discharge.  Left breast mastectomy with steri-strips in place. ABDOMEN:  Soft, non-tender, with active bowel sounds, and no hepatosplenomegaly.  No masses. SKIN:  No rashes, ulcers or lesions. EXTREMITIES: Chronic lower extremity changes (left > right).  No skin discoloration or tenderness.  No palpable cords. LYMPH NODES: No palpable cervical, supraclavicular, axillary or inguinal adenopathy  NEUROLOGICAL: Unremarkable. PSYCH:  Appropriate.    No visits with results within 3 Day(s) from this visit.  Latest known visit with results is:  Admission on 06/13/2017, Discharged on 06/13/2017  Component Date Value Ref Range Status  . Sodium 06/13/2017 140  135 - 145 mmol/L Final  . Potassium 06/13/2017 4.2  3.5 - 5.1 mmol/L Final  . Glucose, Bld 06/13/2017 99  65 - 99 mg/dL Final  . HCT 06/13/2017 33.0* 36.0 - 46.0 % Final  . Hemoglobin 06/13/2017 11.2* 12.0 - 15.0 g/dL Final  . Glucose-Capillary 06/13/2017 102* 65 - 99 mg/dL Final  . SURGICAL  PATHOLOGY 06/13/2017    Final-Edited                   Value:Surgical Pathology  THIS IS AN ADDENDUM REPORT CASE: [id number] PATIENT: Ashley Ryan Surgical Pathology Report Addendum  Reason for Addendum #1:  Additional clinical/test information  SPECIMEN SUBMITTED: A. Breast, left B. Sentinel LN 1  CLINICAL HISTORY: None provided  PRE-OPERATIVE DIAGNOSIS: DCIS  POST-OPERATIVE DIAGNOSIS: Same as pre-op  DIAGNOSIS: A.  LEFT BREAST; SIMPLE MASTECTOMY: - DUCTAL CARCINOMA IN SITU, NUCLEAR GRADE 3. - THE SURGICAL MARGINS ARE NEGATIVE. - BIOPSY SITE CHANGES, MARKER CLIP PRESENT. - BACKGROUND FIBROCYSTIC CHANGE. - SEE SUMMARY BELOW.  B.  SENTINEL LYMPH NODE #1; EXCISION: - NO TUMOR SEEN IN ONE LYMPH NODE (0/1).  CANCER CASE SUMMARY: DUCTAL CARCINOMA IN SITU OF THE BREAST Procedure: Simple mastectomy Specimen Laterality: Left Size (Extent) of DCIS:  at least 8 mm Histologic Type: Ductal carcinoma in situ Nuclear Grade: 3 Necrosis: Comedonecrosis focally present in prior biopsy                          [id number] Margins: Uninvolved by DCIS                      Distance from closest margin: 6 mm                      Specify closest margin: superficial/anterior Regional Lymph nodes: Uninvolved by tumor cells Number of Lymph Nodes Examined: 1 Number of Sentinel Nodes Examined: 1 Pathologic Stage Classification (pTNM, AJCC 8th Edition): pTis(DCIS) pN0(sn) TNM Descriptors: Not applicable  Note: Biomarkers ER and PR by IHC are obtained and results will be issued an addendum.  GROSS DESCRIPTION: A. Labeled: Left breast Received: Fresh and placed in formalin Time in fixative: 11:26 AM Cold ischemic time: 12 minutes Total fixation time: 9 hours Type of mastectomy: Simple Laterality: Left Weight of specimen: 492 grams Size of specimen: 18.7 x 16.1 x 3.8 cm Orientation of specimen: M and L on the skin by the surgeon Inking scheme: Lateral-orange, deep-black and  remaining-blue Skin ellipse dimensions  description: 11.8 x 5.2 cm ellipse of wrinkled tan skin Nipple/                          areola: 0.8 x 0.6 cm depressed and tinged blue nipple and 2.1 x 1.7 cm areola Axillary tail: No appreciable Biopsy site(s): Present Number of discrete masses: 1 discrete biopsy site Location of mass(es): Lower slightly outer, approximately 6-7 o'clock Distance between masses: Not applicable  Size of mass(es)/biopsy site(s): 2.0 x 1.8 x 1.0 cm Description of mass(es)/biopsy site(s): Ill-defined firm red to tan nodular area with hemorrhagic cavity containing coil clip and adjacent to blue dye and dense fibrous tissue that upon inspections tracts to directly underlying the anterior margin Margins: 1.1 cm from the deep margin and tracts to directly underlying anterior Gross involvement of skin/fascia/muscle by tumor: None identified Description of remaining breast: Yellow lobulated fibrofatty with central dense fibrous nodularity Lymph nodes: None identified  Block Summary: 1-10 - entire biopsy site with metallic clip (cassette 3 containing metallic clip and perpendi                         cular deep) and its tract to anterior 11-12 - representative upper outer quadrant 13-14 - representative lower outer quadrant 15-16 - representative upper inner quadrant 17-18 - representative lower inner quadrant 19 - medial nipple and areola 20 - lateral nipple and areola  B. Labeled: Sentinel  lymph node #1 Received: In formalin Tissue fragment(s): 1 Size: 0.8 x 0.6 x 0.5 cm Description: blue tinged lymph node with a small amount of attached fibrofatty tissue, lymph node bisected Entirely submitted in one cassette.  Final Diagnosis performed by Delorse Lek, MD.   Electronically signed 06/18/2017 2:05:05PM The electronic signature indicates that the named Attending Pathologist has evaluated the specimen  Technical component performed at [facility], [address]  [place], [place] [address] Lab: [phone] Dir: Rush Farmer, MD, MMM  Professional component performed at [facility], [facility], [place], [place], [place] [address] Lab: [phone] Dir: Dellia Nims. Rubinas, MD  ADDENDUM: BREAST BIOMARKER TESTS Estrogen Receptor (ER) Status: POSITIVE Percentage of cells with nuclear positivity: >90% Average intensity of staining: Strong  Progesterone Receptor (PgR) Status: POSITIVE Percentage of cells with nuclear positivity: 75% Average intensity of staining: Moderate  Cold Ischemia and Fixation Times: Meet requirements specified in latest version of the ASCO/CAP guidelines Testing Performed on Block Number(s): A8  METHODS Fixative: Formalin Estrogen Receptor:  FDA cleared ([place]) Primary Antibody:  SP1 Progesterone Receptor: FDA cleared ([place]) Primary Antibody: 1E2 Immunohistochemistry controls worked appropriately. Slides were prepared by [facility], [place], [place], and interpreted by Dr. Luana Shu.  This test was developed and its performance characteristics determined by [facility]. It has not been cleared or approved by the [place] Food and Drug Administration. The FD                         A does not require this test to go through premarket FDA review. This test is used for clinical purposes. It should not be regarded as investigational or for research. This laboratory is certified under the Clinical Laboratory Improvement Amendments (CLIA) as qualified to perform high complexity clinical laboratory testing.  Addendum #1 performed by Delorse Lek, MD.   Electronically signed 06/23/2017 12:20:42PM The electronic signature indicates that the named Attending Pathologist has evaluated the specimen  Technical component performed at [facility], [address], [place], [place] [address] Lab: [phone] Dir: Rush Farmer, MD, MMM  Professional component performed at  [facility], [facility], [place], [place], [place] [address] Lab: [phone] Dir: Dellia Nims. Rubinas, MD   . Glucose-Capillary 06/13/2017 112* 65 - 99 mg/dL Final    Assessment:  Ashley Ryan is a 72 y.o. female with left breast DCIS s/p simple mastectomy on 06/13/2017.  Pathology revealed at least 8 mm of grade III DCIS.  Margins were negative.  One sentinel lymph node was negative.  DCIS was ER was + (> 90%) and PR + (75%).  Pathologic stage was Tis N0.  Diagnostic left mammogram on 05/14/2017 revealed grouped pleomorphic calcifications within the lower outer quadrant of the LEFT breast, spanning 7 mm.  Bone density study on 11/22/2014 revealed osteopenia with a T-score of -2.4 in the AP spine L1-L4.  Bone density on 06/02/2017 revealed osteopenia with a T-score of -2.1 in the AP spine L1-L4 and -1.9 in the right femoral neck.  She a mild normocytic anemia.  She received a blood transfusion 6 years ago.  She took oral iron x 2 years, but stopped > 1 month ago.  Labs on 06/02/2017 revealed a ferritin 43 with normal iron studies.  Folate was 9.6.  She takes oral B12.  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  Colonoscopy on 09/09/2013 revealed an 8 mm sessile polyp in the cecum (tubular adenoma).  Symptomatically, she feels good. She denies acute symptoms. Patient Is healing well from her recent mastectomy. Exam reveals a healing LEFT mastectomy site.   Plan: 1. Labs today: LFTs, B12 2. Discuss interval mastectomy and pathology. 3. Discuss no need for radiation.  Discuss consideration of risk reduction hormonal therapy for the contralateral breast.  NSABP B-24 showed benefit for tamoxifen in the contralateral breast with a 10 year rate of invasive and noninvasive cancer of 6.9% (placebo) and 4.7% (tamoxifen).  Absolute reduction of 3.2% in contralateral breast cancers at 13.6 years.   Baker Janus model (without diagnosis of DCIS) 5 year risk of breast cancer is 4%.  Discuss tamoxifen, aromatase inhibitor versus raloxifene.  Reduction of breast cancer risk by 49% (tamoxifen and raloxifene) and 53-65% with aromatase inhibitor (Arimidex, Aromasin). Side effects reviewed. Patient provided with printed information on tamoxifen today on her AVS for review at home. Patient electing to start tamoxifen today. Rx sent for tamoxifen to patient's pharmacy (Disp #30).  4. Discuss osteopenia.  Encouraged patient to talke calcium 1200 mg and vitamin D 800 IU daily. 5. Discuss anemia.  Discuss follow-up B12 level. If low, we will call patient to start oral B12. 6. RTC in 1 month for MD assessment and labs (CBC with diff, CMP).   ADDENDUM: B12 level result low at 299. Patient called and asked to resume oral B12 supplement daily. New Rx sent to pharmacy per her request. Will recheck level at next RTC to assess for improvement of levels. If not improving, we discussed the potential need for parenteral supplementation.    Honor Loh, NP  07/03/2017, 9:58 AM   I saw and evaluated the patient, participating in the key portions of the service and reviewing pertinent diagnostic studies and records.  I reviewed the nurse practitioner's note and agree with the findings and the plan.  The assessment and plan were discussed with the patient. Multiple questions were asked by the patient and answered.   Nolon Stalls, MD 07/03/2017,9:58 AM

## 2017-07-03 NOTE — Progress Notes (Signed)
Pt in for follow up, had left breast mastectomy on 06/13/17, states "doing well". Pt reports she fell in her home this morning.

## 2017-07-04 MED ORDER — VITAMIN B-12 1000 MCG PO TABS: 1000.0000 ug | ORAL_TABLET | Freq: Every day | ORAL | 0 refills | Status: DC

## 2017-07-08 ENCOUNTER — Telehealth: Payer: Self-pay | Admitting: *Deleted

## 2017-07-08 ENCOUNTER — Encounter: Payer: Self-pay | Admitting: General Surgery

## 2017-07-08 ENCOUNTER — Ambulatory Visit (INDEPENDENT_AMBULATORY_CARE_PROVIDER_SITE_OTHER): Payer: Medicare HMO | Admitting: General Surgery

## 2017-07-08 VITALS — BP 130/72 | HR 59 | Resp 18 | Ht 61.0 in | Wt 154.0 lb

## 2017-07-08 DIAGNOSIS — D0512 Intraductal carcinoma in situ of left breast: Secondary | ICD-10-CM

## 2017-07-08 DIAGNOSIS — Z4432 Encounter for fitting and adjustment of external left breast prosthesis: Secondary | ICD-10-CM | POA: Diagnosis not present

## 2017-07-08 DIAGNOSIS — C50112 Malignant neoplasm of central portion of left female breast: Secondary | ICD-10-CM | POA: Diagnosis not present

## 2017-07-08 NOTE — Telephone Encounter (Signed)
Patient advised of B12 sent to mail order pharmacy and that potassium was to be ordered. She thanked me for the clarification

## 2017-07-08 NOTE — Progress Notes (Signed)
Patient ID: Ashley Ryan, female   DOB: December 23, 1945, 72 y.o.   MRN: [id number]  Chief Complaint  Patient presents with  . Routine Post Op    HPI Ashley Ryan is a 72 y.o. female here for post op left breast mastectomy done on 06/13/17. She reports some fluid build up in the left chest wall. She is tolerating the Tamoxifen well. She has been making use of a heating pad.  HPI  Past Medical History:  Diagnosis Date  . Anemia   . Arthritis   . Chronic headache   . Complication of anesthesia    prior to 1991 used to have PONV.  none recently.  . Diverticulosis of colon   . Family history of adverse reaction to anesthesia    mother/daighter get sick  . GERD with stricture   . Hard of hearing   . Heart murmur    followed by PCP  . Hiatal hernia   . Hypertension   . Neuropathy    feet  . Osteoporosis   . Pre-diabetes   . Wears dentures    full upper    Past Surgical History:  Procedure Laterality Date  . ABDOMINAL HYSTERECTOMY  1978   one ovary remains  . BREAST BIOPSY Left 05/26/2017   Affirm Bx- path pending - coil clip  . CARDIAC CATHETERIZATION     "yrs ago" all OK.  Marland Kitchen CATARACT EXTRACTION W/PHACO Left 02/21/2016   Procedure: CATARACT EXTRACTION PHACO AND INTRAOCULAR LENS PLACEMENT ([place])  Left eye;  Surgeon: Leandrew Koyanagi, MD;  Location: [place];  Service: Ophthalmology;  Laterality: Left;  Left eye Diabetic  . CATARACT EXTRACTION W/PHACO Right 03/13/2016   Procedure: CATARACT EXTRACTION PHACO AND INTRAOCULAR LENS PLACEMENT ([place])  right  diabetic;  Surgeon: Leandrew Koyanagi, MD;  Location: [place];  Service: Ophthalmology;  Laterality: Right;  diabetic  . CHOLECYSTECTOMY  1978  . OOPHORECTOMY     one still left  . SENTINEL NODE BIOPSY Left 06/13/2017   Procedure: SENTINEL NODE BIOPSY;  Surgeon: Robert Bellow, MD;  Location: [facility];  Service: General;  Laterality: Left;  . SIMPLE MASTECTOMY WITH AXILLARY SENTINEL NODE BIOPSY Left  06/13/2017   Procedure: SIMPLE MASTECTOMY;  Surgeon: Robert Bellow, MD;  Location: [facility];  Service: General;  Laterality: Left;    Family History  Problem Relation Age of Onset  . Breast cancer Unknown   . Stomach cancer Unknown   . Diabetes Unknown   . Heart disease Unknown   . Breast cancer Sister 5  . Esophageal cancer Brother     Social History Social History   Tobacco Use  . Smoking status: Never Smoker  . Smokeless tobacco: Never Used  Substance Use Topics  . Alcohol use: No  . Drug use: No    Allergies  Allergen Reactions  . Sulfa Antibiotics Rash    Childhood reaction  . Sulfonamide Derivatives Rash    Childhood reaction.    Current Outpatient Medications  Medication Sig Dispense Refill  . ACCU-CHEK SOFTCLIX LANCETS lancets Use to check blood sugar once daily 100 each 3  . Alcohol Swabs (B-D SINGLE USE SWABS REGULAR) PADS Use to check blood sugar once daily 100 each 3  . amLODipine (NORVASC) 5 MG tablet Take 1 tablet (5 mg total) by mouth daily. 90 tablet 3  . aspirin EC 81 MG tablet Take 81 mg by mouth daily.     Marland Kitchen atenolol (TENORMIN) 100 MG tablet TAKE 1 TABLET  EVERY DAY 90 tablet 1  . atorvastatin (LIPITOR) 40 MG tablet TAKE 1 TABLET EVERY DAY (Patient taking differently: TAKE 1 TABLET EVERY DAY at bedtime) 90 tablet 1  . Blood Glucose Calibration (ACCU-CHEK AVIVA) SOLN Use to check blood sugar once daily 1 each 0  . Blood Glucose Monitoring Suppl (ACCU-CHEK AVIVA PLUS) W/DEVICE KIT Use to check blood sugar once daily 1 kit 0  . Calcium Carbonate-Vitamin D (CALCIUM 600+D) 600-400 MG-UNIT per tablet Take 1 tablet by mouth 2 (two) times daily.     . Calcium Polycarbophil (FIBER-CAPS PO) Take 1 capsule by mouth daily.    Marland Kitchen esomeprazole (NEXIUM) 40 MG capsule TAKE 1 CAPSULE 2 (TWO) TIMES DAILY BEFORE A MEAL. 180 capsule 1  . FLUoxetine (PROZAC) 20 MG capsule TAKE 1 CAPSULE EVERY DAY 90 capsule 1  . fluticasone (FLONASE) 50 MCG/ACT nasal spray PLACE 2  SPRAYS INTO THE NOSE DAILY AS DIRECTED 48 g 3  . gabapentin (NEURONTIN) 100 MG capsule TAKE 1 CAPSULE IN THE MORNING, 1 CAPSULE AT LUNCH, 1 CAPSULES AT DINNER AND 2 TO 3 CAPSULES AT BEDTIME 540 capsule 5  . Glucosamine 500 MG TABS Take 500 mg by mouth daily.     Marland Kitchen glucose blood (ACCU-CHEK AVIVA) test strip Use to check blood sugar once daily 100 each 3  . latanoprost (XALATAN) 0.005 % ophthalmic solution Place 1 drop into both eyes at bedtime.    . lidocaine (LIDODERM) 5 % APPLY 1 PATCH ONTO THE SKIN EVERY DAY. REMOVE AND DISCARD PATCH WITHIN 12 HOURS OR AS DIRECTED BY PHYSICIAN. 90 patch 0  . loratadine (CLARITIN) 10 MG tablet Take 10 mg by mouth daily.    Marland Kitchen losartan-hydrochlorothiazide (HYZAAR) 100-25 MG tablet TAKE 1 TABLET EVERY DAY (Patient taking differently: TAKE 1 TABLET EVERY DAY AT BEDTIME.) 90 tablet 1  . Omega-3 Fatty Acids (FISH OIL) 1000 MG CAPS Take 1,000 mg by mouth daily.     . phenylephrine (SUDAFED PE) 10 MG TABS tablet Take 10 mg by mouth every 4 (four) hours as needed (for nasal congestion.).    Marland Kitchen tamoxifen (NOLVADEX) 20 MG tablet Take 1 tablet (20 mg total) by mouth daily. 30 tablet 0  . traMADol (ULTRAM) 50 MG tablet TAKE 1 TABLET EVERY 6 HOURS AS NEEDED (Patient taking differently: TAKE 1 TABLET EVERY 6 HOURS AS NEEDED FOR PAIN.) 90 tablet 0  . vitamin B-12 (CYANOCOBALAMIN) 1000 MCG tablet Take 1 tablet (1,000 mcg total) by mouth daily. 90 tablet 0   No current facility-administered medications for this visit.     Review of Systems Review of Systems  Constitutional: Negative.   Respiratory: Negative.   Cardiovascular: Negative.     Blood pressure 130/72, pulse (!) 59, resp. rate 18, height '5\' 1"'$  (1.549 m), weight 154 lb (69.9 kg), SpO2 98 %.  Physical Exam Physical Exam  Constitutional: She is oriented to person, place, and time. She appears well-developed and well-nourished.  Pulmonary/Chest:  Small amount of fluid left chest wall. Well healing left mastectomy  site.    Neurological: She is alert and oriented to person, place, and time.  Skin: Skin is warm and dry.  Psychiatric: She has a normal mood and affect.    Data Reviewed Aspiration attempted after ChloraPrep, 1 cc 1% plain Xylocaine.  No fluid noted.  Assessment    Doing well post mastectomy.    Plan    Follow up in 2 weeks May return to work with activity as tolerated  HPI, Physical Exam, Assessment and Plan have been scribed under the direction and in the presence of Robert Bellow, MD  Concepcion Living, LPN  I have completed the exam and reviewed the above documentation for accuracy and completeness.  I agree with the above.  Haematologist has been used and any errors in dictation or transcription are unintentional.  Hervey Ard, M.D., F.A.C.S.  Forest Gleason Melanee Cordial 07/08/2017, 8:12 AM

## 2017-07-08 NOTE — Telephone Encounter (Signed)
It was not potassium, it was B12. I sent it in on 07/04/2017.

## 2017-07-08 NOTE — Patient Instructions (Addendum)
May return to work Activity as tolerated.  Follow up in 2 week.

## 2017-07-08 NOTE — Telephone Encounter (Signed)
Patient called and reports that Gaspar Bidding said he was going to order potassium for her and it has not been sent to pharmacy. Please advise

## 2017-07-17 DIAGNOSIS — Z4432 Encounter for fitting and adjustment of external left breast prosthesis: Secondary | ICD-10-CM | POA: Diagnosis not present

## 2017-07-17 DIAGNOSIS — C50112 Malignant neoplasm of central portion of left female breast: Secondary | ICD-10-CM | POA: Diagnosis not present

## 2017-07-22 ENCOUNTER — Encounter: Payer: Self-pay | Admitting: General Surgery

## 2017-07-22 ENCOUNTER — Ambulatory Visit (INDEPENDENT_AMBULATORY_CARE_PROVIDER_SITE_OTHER): Payer: Medicare HMO | Admitting: General Surgery

## 2017-07-22 VITALS — BP 106/60 | HR 58 | Resp 12 | Ht 61.0 in | Wt 155.0 lb

## 2017-07-22 DIAGNOSIS — D0512 Intraductal carcinoma in situ of left breast: Secondary | ICD-10-CM

## 2017-07-22 NOTE — Patient Instructions (Addendum)
Return in four months. The patient is aware to call back for any questions or concerns.

## 2017-07-22 NOTE — Progress Notes (Signed)
Patient ID: Ashley Ryan, female   DOB: 13-Apr-1945, 72 y.o.   MRN: [id number]  Chief Complaint  Patient presents with  . Routine Post Op    HPI Ashley Ryan is a 72 y.o. here today for her follow up left mastectomy done on 06/13/2017. Patient states she is doing well. She has started the tamoxifen and the only effects she has noticed is being tired. She states she has started B12 tablets as well to see if that helps.  HPI  Past Medical History:  Diagnosis Date  . Anemia   . Arthritis   . Chronic headache   . Complication of anesthesia    prior to 1991 used to have PONV.  none recently.  . Diverticulosis of colon   . Family history of adverse reaction to anesthesia    mother/daighter get sick  . GERD with stricture   . Hard of hearing   . Heart murmur    followed by PCP  . Hiatal hernia   . Hypertension   . Neuropathy    feet  . Osteoporosis   . Pre-diabetes   . Wears dentures    full upper    Past Surgical History:  Procedure Laterality Date  . ABDOMINAL HYSTERECTOMY  1978   one ovary remains  . BREAST BIOPSY Left 05/26/2017   Affirm Bx- path pending - coil clip  . CARDIAC CATHETERIZATION     "yrs ago" all OK.  Marland Kitchen CATARACT EXTRACTION W/PHACO Left 02/21/2016   Procedure: CATARACT EXTRACTION PHACO AND INTRAOCULAR LENS PLACEMENT ([place])  Left eye;  Surgeon: Leandrew Koyanagi, MD;  Location: [place];  Service: Ophthalmology;  Laterality: Left;  Left eye Diabetic  . CATARACT EXTRACTION W/PHACO Right 03/13/2016   Procedure: CATARACT EXTRACTION PHACO AND INTRAOCULAR LENS PLACEMENT ([place])  right  diabetic;  Surgeon: Leandrew Koyanagi, MD;  Location: [place];  Service: Ophthalmology;  Laterality: Right;  diabetic  . CHOLECYSTECTOMY  1978  . OOPHORECTOMY     one still left  . SENTINEL NODE BIOPSY Left 06/13/2017   Procedure: SENTINEL NODE BIOPSY;  Surgeon: Robert Bellow, MD;  Location: [facility];  Service: General;  Laterality: Left;  . 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 ER +.  Surgeon: Robert Bellow, MD;  Location: [facility];  Service: General;  Laterality: Left;    Family History  Problem Relation Age of Onset  . Breast cancer Unknown   . Stomach cancer Unknown   . Diabetes Unknown   . Heart disease Unknown   . Breast cancer Sister 66  . Esophageal cancer Brother     Social History Social History   Tobacco Use  . Smoking status: Never Smoker  . Smokeless tobacco: Never Used  Substance Use Topics  . Alcohol use: No  . Drug use: No    Allergies  Allergen Reactions  . Sulfa Antibiotics Rash    Childhood reaction  . Sulfonamide Derivatives Rash    Childhood reaction.    Current Outpatient Medications  Medication Sig Dispense Refill  . ACCU-CHEK SOFTCLIX LANCETS lancets Use to check blood sugar once daily 100 each 3  . Alcohol Swabs (B-D SINGLE USE SWABS REGULAR) PADS Use to check blood sugar once daily 100 each 3  . amLODipine (NORVASC) 5 MG tablet Take 1 tablet (5 mg total) by mouth daily. 90 tablet 3  . aspirin EC 81 MG tablet Take 81 mg by  mouth daily.     Marland Kitchen atenolol (TENORMIN) 100 MG tablet TAKE 1 TABLET EVERY DAY 90 tablet 1  . atorvastatin (LIPITOR) 40 MG tablet TAKE 1 TABLET EVERY DAY (Patient taking differently: TAKE 1 TABLET EVERY DAY at bedtime) 90 tablet 1  . Blood Glucose Calibration (ACCU-CHEK AVIVA) SOLN Use to check blood sugar once daily 1 each 0  . Blood Glucose Monitoring Suppl (ACCU-CHEK AVIVA PLUS) W/DEVICE KIT Use to check blood sugar once daily 1 kit 0  . Calcium Carbonate-Vitamin D (CALCIUM 600+D) 600-400 MG-UNIT per tablet Take 1 tablet by mouth 2 (two) times daily.     . Calcium Polycarbophil (FIBER-CAPS PO) Take 1 capsule by mouth daily.    Marland Kitchen esomeprazole (NEXIUM) 40 MG capsule TAKE 1 CAPSULE 2 (TWO) TIMES DAILY BEFORE A MEAL. 180 capsule 1  . FLUoxetine (PROZAC) 20 MG capsule TAKE 1 CAPSULE EVERY DAY 90 capsule  1  . fluticasone (FLONASE) 50 MCG/ACT nasal spray PLACE 2 SPRAYS INTO THE NOSE DAILY AS DIRECTED 48 g 3  . gabapentin (NEURONTIN) 100 MG capsule TAKE 1 CAPSULE IN THE MORNING, 1 CAPSULE AT LUNCH, 1 CAPSULES AT DINNER AND 2 TO 3 CAPSULES AT BEDTIME 540 capsule 5  . Glucosamine 500 MG TABS Take 500 mg by mouth daily.     Marland Kitchen glucose blood (ACCU-CHEK AVIVA) test strip Use to check blood sugar once daily 100 each 3  . latanoprost (XALATAN) 0.005 % ophthalmic solution Place 1 drop into both eyes at bedtime.    . lidocaine (LIDODERM) 5 % APPLY 1 PATCH ONTO THE SKIN EVERY DAY. REMOVE AND DISCARD PATCH WITHIN 12 HOURS OR AS DIRECTED BY PHYSICIAN. 90 patch 0  . loratadine (CLARITIN) 10 MG tablet Take 10 mg by mouth daily.    Marland Kitchen losartan-hydrochlorothiazide (HYZAAR) 100-25 MG tablet TAKE 1 TABLET EVERY DAY (Patient taking differently: TAKE 1 TABLET EVERY DAY AT BEDTIME.) 90 tablet 1  . Omega-3 Fatty Acids (FISH OIL) 1000 MG CAPS Take 1,000 mg by mouth daily.     . phenylephrine (SUDAFED PE) 10 MG TABS tablet Take 10 mg by mouth every 4 (four) hours as needed (for nasal congestion.).    Marland Kitchen tamoxifen (NOLVADEX) 20 MG tablet Take 1 tablet (20 mg total) by mouth daily. 30 tablet 0  . traMADol (ULTRAM) 50 MG tablet TAKE 1 TABLET EVERY 6 HOURS AS NEEDED (Patient taking differently: TAKE 1 TABLET EVERY 6 HOURS AS NEEDED FOR PAIN.) 90 tablet 0  . vitamin B-12 (CYANOCOBALAMIN) 1000 MCG tablet Take 1 tablet (1,000 mcg total) by mouth daily. 90 tablet 0   No current facility-administered medications for this visit.     Review of Systems Review of Systems  Constitutional: Negative.   Respiratory: Negative.   Cardiovascular: Negative.     Blood pressure 106/60, pulse (!) 58, resp. rate 12, height '5\' 1"'$  (1.549 m), weight 155 lb (70.3 kg), SpO2 98 %.  Physical Exam Physical Exam  Constitutional: She is oriented to person, place, and time. She appears well-developed and well-nourished.  Cardiovascular: Normal rate  and regular rhythm.  Pulmonary/Chest: Effort normal and breath sounds normal.    Neurological: She is alert and oriented to person, place, and time.  Skin: Skin is warm and dry.  Full shoulder range of motion.  Data Reviewed No new data.  Assessment    Doing well post mastectomy.    Plan       Patient to return in four months. The patient is aware to call back for  any questions or concerns.   HPI, Physical Exam, Assessment and Plan have been scribed under the direction and in the presence of Robert Bellow, MD. Karie Fetch, RN  I have completed the exam and reviewed the above documentation for accuracy and completeness.  I agree with the above.  [employer] has been used and any errors in dictation or transcription are unintentional.  Hervey Ard, M.D., F.A.C.S..  Ashley Ryan 07/23/2017, 12:55 PM

## 2017-07-23 ENCOUNTER — Encounter: Payer: Self-pay | Admitting: General Surgery

## 2017-07-24 DIAGNOSIS — E538 Deficiency of other specified B group vitamins: Secondary | ICD-10-CM | POA: Insufficient documentation

## 2017-07-25 ENCOUNTER — Other Ambulatory Visit: Payer: Self-pay | Admitting: Urgent Care

## 2017-07-25 DIAGNOSIS — H401132 Primary open-angle glaucoma, bilateral, moderate stage: Secondary | ICD-10-CM | POA: Diagnosis not present

## 2017-07-30 NOTE — Progress Notes (Signed)
[facility] day:  08/01/2017   Chief Complaint: Ashley Ryan is a 72 y.o. female with left breast DCIS s/p mastectomy who is seen for 1 month assessment on tamoxifen.  HPI:  The patient was last seen in the medical oncology clinic on 07/03/2017. At that time, patient was doing well overall follow her mastectomy. She has been seen in post-operative follow up by Dr. Bary Castilla. Minimal seroma noted. No B symptoms or infections. Weight down 6 pounds. Exam revealed a healing LEFT mastectomy site. She was started on endocrine therapy (tamoxifen).   B12 resulted as low on 07/04/2017. Patient was notified and asked to start oral B12 1,000 mcg daily.   She was seen in follow up consult on 07/22/2017 by Dr. Hervey Ard (surgery). Notes reviewed. Patient doing well post-operatively. No issues noted. Patient complained of increased fatigue since starting on hormonal therapy. She will see surgery again in 4 months barring any immediate needs or complications.   In the interim, she has been doing well. She is tolerating her newly initiated hormonal therapy (tamoxifen) well. She notes infrequent vasomotor symptoms are "minor and manageable". She is a bit more fatigued than she normally is. She has chronic, yet stable, exertional dyspnea. Otherwise, patient is doing "just fine".   Patient denies that she has experienced any B symptoms. She denies any interval infections. Patient advises that she maintains an adequate appetite. She is eating well. Weight today is 157 lb 7 oz (71.4 kg), which compared to her last visit to the clinic, represents a  2 pound increase.   Patient denies pain in the clinic today.  Past Medical History:  Diagnosis Date  . Anemia   . Arthritis   . Chronic headache   . Complication of anesthesia    prior to 1991 used to have PONV.  none recently.  . Diverticulosis of colon   . Family history of adverse reaction to anesthesia     mother/daighter get sick  . GERD with stricture   . Hard of hearing   . Heart murmur    followed by PCP  . Hiatal hernia   . Hypertension   . Neuropathy    feet  . Osteoporosis   . Pre-diabetes   . Wears dentures    full upper    Past Surgical History:  Procedure Laterality Date  . ABDOMINAL HYSTERECTOMY  1978   one ovary remains  . BREAST BIOPSY Left 05/26/2017   Affirm Bx- path pending - coil clip  . CARDIAC CATHETERIZATION     "yrs ago" all OK.  Marland Kitchen CATARACT EXTRACTION W/PHACO Left 02/21/2016   Procedure: CATARACT EXTRACTION PHACO AND INTRAOCULAR LENS PLACEMENT ([place])  Left eye;  Surgeon: Leandrew Koyanagi, MD;  Location: [facility];  Service: Ophthalmology;  Laterality: Left;  Left eye Diabetic  . CATARACT EXTRACTION W/PHACO Right 03/13/2016   Procedure: CATARACT EXTRACTION PHACO AND INTRAOCULAR LENS PLACEMENT ([place])  right  diabetic;  Surgeon: Leandrew Koyanagi, MD;  Location: [facility];  Service: Ophthalmology;  Laterality: Right;  diabetic  . CHOLECYSTECTOMY  1978  . OOPHORECTOMY     one still left  . SENTINEL NODE BIOPSY Left 06/13/2017   Procedure: SENTINEL NODE BIOPSY;  Surgeon: Robert Bellow, MD;  Location: [facility];  Service: General;  Laterality: Left;  . 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  Surgeon: Robert Bellow, MD;  Location: [facility];  Service: General;  Laterality: Left;    Family History  Problem Relation Age of Onset  . Breast cancer Unknown   . Stomach cancer Unknown   . Diabetes Unknown   . Heart disease Unknown   . Breast cancer Sister 66  . Esophageal cancer Brother     Social History:  reports that she has never smoked. She has never used smokeless tobacco. She reports that she does not drink alcohol or use drugs.  Her husband's name is Scientist, research (medical).  She is a retired [employer] manager.  She lives in [place].  The patient is alone today.  Allergies:   Allergies  Allergen Reactions  . Sulfa Antibiotics Rash    Childhood reaction  . Sulfonamide Derivatives Rash    Childhood reaction.    Current Medications: Current Outpatient Medications  Medication Sig Dispense Refill  . ACCU-CHEK SOFTCLIX LANCETS lancets Use to check blood sugar once daily 100 each 3  . Alcohol Swabs (B-D SINGLE USE SWABS REGULAR) PADS Use to check blood sugar once daily 100 each 3  . amLODipine (NORVASC) 5 MG tablet Take 1 tablet (5 mg total) by mouth daily. 90 tablet 3  . aspirin EC 81 MG tablet Take 81 mg by mouth daily.     Marland Kitchen atenolol (TENORMIN) 100 MG tablet TAKE 1 TABLET EVERY DAY 90 tablet 1  . atorvastatin (LIPITOR) 40 MG tablet TAKE 1 TABLET EVERY DAY (Patient taking differently: TAKE 1 TABLET EVERY DAY at bedtime) 90 tablet 1  . Blood Glucose Calibration (ACCU-CHEK AVIVA) SOLN Use to check blood sugar once daily 1 each 0  . Blood Glucose Monitoring Suppl (ACCU-CHEK AVIVA PLUS) W/DEVICE KIT Use to check blood sugar once daily 1 kit 0  . Calcium Carbonate-Vitamin D (CALCIUM 600+D) 600-400 MG-UNIT per tablet Take 1 tablet by mouth 2 (two) times daily.     . Calcium Polycarbophil (FIBER-CAPS PO) Take 1 capsule by mouth daily.    Marland Kitchen esomeprazole (NEXIUM) 40 MG capsule TAKE 1 CAPSULE 2 (TWO) TIMES DAILY BEFORE A MEAL. 180 capsule 1  . FLUoxetine (PROZAC) 20 MG capsule TAKE 1 CAPSULE EVERY DAY 90 capsule 1  . fluticasone (FLONASE) 50 MCG/ACT nasal spray PLACE 2 SPRAYS INTO THE NOSE DAILY AS DIRECTED 48 g 3  . gabapentin (NEURONTIN) 100 MG capsule TAKE 1 CAPSULE IN THE MORNING, 1 CAPSULE AT LUNCH, 1 CAPSULES AT DINNER AND 2 TO 3 CAPSULES AT BEDTIME 540 capsule 5  . Glucosamine 500 MG TABS Take 500 mg by mouth daily.     Marland Kitchen glucose blood (ACCU-CHEK AVIVA) test strip Use to check blood sugar once daily 100 each 3  . latanoprost (XALATAN) 0.005 % ophthalmic solution Place 1 drop into both eyes at bedtime.    . lidocaine (LIDODERM) 5 % APPLY 1 PATCH ONTO THE SKIN EVERY  DAY. REMOVE AND DISCARD PATCH WITHIN 12 HOURS OR AS DIRECTED BY PHYSICIAN. 90 patch 0  . loratadine (CLARITIN) 10 MG tablet Take 10 mg by mouth daily.    Marland Kitchen losartan-hydrochlorothiazide (HYZAAR) 100-25 MG tablet TAKE 1 TABLET EVERY DAY (Patient taking differently: TAKE 1 TABLET EVERY DAY AT BEDTIME.) 90 tablet 1  . Omega-3 Fatty Acids (FISH OIL) 1000 MG CAPS Take 1,000 mg by mouth daily.     . phenylephrine (SUDAFED PE) 10 MG TABS tablet Take 10 mg by mouth every 4 (four) hours as needed (for nasal congestion.).    Derrill Memo ON 08/29/2017] tamoxifen (NOLVADEX) 20 MG tablet Take 1 tablet (  20 mg total) by mouth daily. 90 tablet 3  . traMADol (ULTRAM) 50 MG tablet TAKE 1 TABLET EVERY 6 HOURS AS NEEDED (Patient taking differently: TAKE 1 TABLET EVERY 6 HOURS AS NEEDED FOR PAIN.) 90 tablet 0  . vitamin B-12 (CYANOCOBALAMIN) 1000 MCG tablet Take 1 tablet (1,000 mcg total) by mouth daily. 90 tablet 0   No current facility-administered medications for this visit.     Review of Systems  Constitutional: Positive for malaise/fatigue. Negative for diaphoresis, fever and weight loss (weight up 2 pounds).       "Im a little tired, but other than that I am just fine".   HENT: Negative for nosebleeds and sore throat.        "I have bad teeth".  Eyes: Negative.   Respiratory: Positive for shortness of breath (exertional). Negative for cough, hemoptysis and sputum production.   Cardiovascular: Negative for chest pain, palpitations, orthopnea, leg swelling and PND.  Gastrointestinal: Negative for abdominal pain, blood in stool, constipation, diarrhea, melena, nausea and vomiting.  Genitourinary: Negative for dysuria, frequency, hematuria and urgency.  Musculoskeletal: Positive for joint pain. Negative for back pain, falls and myalgias.  Skin: Negative for itching and rash.       LEFT mastectomy site healing well with no s/s of infection.  Neurological: Negative for dizziness, tremors, weakness and headaches.   Endo/Heme/Allergies: Does not bruise/bleed easily.       Vasomotor symptoms - "minor and manageable"  Psychiatric/Behavioral: Negative for depression, memory loss and suicidal ideas. The patient is not nervous/anxious and does not have insomnia.   All other systems reviewed and are negative.  Performance status (ECOG): 1 - Symptomatic but completely ambulatory   Vital signs BP 131/75 (BP Location: Left Arm, Patient Position: Sitting)   Pulse (!) 55   Temp 97.8 F (36.6 C) (Tympanic)   Resp 18   Wt 157 lb 7 oz (71.4 kg)   BMI 29.75 kg/m   Physical Exam  Constitutional: She is oriented to person, place, and time and well-developed, well-nourished, and in no distress.  HENT:  Head: Normocephalic and atraumatic.  Short grey hair  Eyes: Pupils are equal, round, and reactive to light. EOM are normal. No scleral icterus.  Glasses. Blue eyes.  Neck: Normal range of motion. Neck supple. No tracheal deviation present. No thyromegaly present.  Cardiovascular: Normal rate, regular rhythm and normal heart sounds. Exam reveals no gallop and no friction rub.  No murmur heard. Pulmonary/Chest: Effort normal and breath sounds normal. No respiratory distress. She has no wheezes. She has no rales.  Abdominal: Soft. Bowel sounds are normal. She exhibits no distension. There is no tenderness.  Musculoskeletal: Normal range of motion. She exhibits no edema or tenderness.  Chronic lower extremity changes (L>R).  Lymphadenopathy:    She has no cervical adenopathy.    She has no axillary adenopathy.       Right: No inguinal and no supraclavicular adenopathy present.       Left: No inguinal and no supraclavicular adenopathy present.  Neurological: She is alert and oriented to person, place, and time.  Skin: Skin is warm and dry. No rash noted. No erythema.  LEFT breast surgically absent. Healing well with no signs of infection (no erythema or drainage).   Psychiatric: Mood, affect and judgment normal.   Nursing note and vitals reviewed.   Results for orders placed or performed in visit on 08/01/17 (from the past 72 hour(s))  Comprehensive metabolic panel     Status:  Abnormal   Collection Time: 08/01/17  9:55 AM  Result Value Ref Range   Sodium 136 135 - 145 mmol/L   Potassium 3.5 3.5 - 5.1 mmol/L   Chloride 102 98 - 111 mmol/L    Comment: Please note change in reference range.   CO2 24 22 - 32 mmol/L   Glucose, Bld 108 (H) 70 - 99 mg/dL    Comment: Please note change in reference range.   BUN 18 8 - 23 mg/dL    Comment: Please note change in reference range.   Creatinine, Ser 0.80 0.44 - 1.00 mg/dL   Calcium 9.2 8.9 - 10.3 mg/dL   Total Protein 6.9 6.5 - 8.1 g/dL   Albumin 4.0 3.5 - 5.0 g/dL   AST 17 15 - 41 U/L   ALT 12 0 - 44 U/L    Comment: Please note change in reference range.   Alkaline Phosphatase 51 38 - 126 U/L   Total Bilirubin 0.3 0.3 - 1.2 mg/dL   GFR calc non Af Amer >60 >60 mL/min   GFR calc Af Amer >60 >60 mL/min    Comment: (NOTE) The eGFR has been calculated using the CKD EPI equation. This calculation has not been validated in all clinical situations. eGFR's persistently <60 mL/min signify possible Chronic Kidney Disease.    Anion gap 10 5 - 15    Comment: Performed at [facility], [address] [place], [place] [address]  CBC with Differential     Status: Abnormal   Collection Time: 08/01/17  9:55 AM  Result Value Ref Range   WBC 5.1 3.6 - 11.0 K/uL   RBC 3.96 3.80 - 5.20 MIL/uL   Hemoglobin 10.9 (L) 12.0 - 16.0 g/dL   HCT 33.8 (L) 35.0 - 47.0 %   MCV 85.4 80.0 - 100.0 fL   MCH 27.6 26.0 - 34.0 pg   MCHC 32.3 32.0 - 36.0 g/dL   RDW 16.3 (H) 11.5 - 14.5 %   Platelets 163 150 - 440 K/uL   Neutrophils Relative % 64 %   Neutro Abs 3.2 1.4 - 6.5 K/uL   Lymphocytes Relative 25 %   Lymphs Abs 1.3 1.0 - 3.6 K/uL   Monocytes Relative 8 %   Monocytes Absolute 0.4 0.2 - 0.9 K/uL   Eosinophils Relative 2 %   Eosinophils Absolute 0.1 0 - 0.7  K/uL   Basophils Relative 1 %   Basophils Absolute 0.1 0 - 0.1 K/uL    Comment: Performed at [facility], [address] [place], [place] [address]     Assessment:  Ashley Ryan is a 72 y.o. female with left breast DCIS s/p simple mastectomy on 06/13/2017.  Pathology revealed at least 8 mm of grade III DCIS.  Margins were negative.  One sentinel lymph node was negative.  DCIS was ER was + (> 90%) and PR + (75%).  Pathologic stage was Tis N0.  She began tamoxifen on 07/03/2017.  Diagnostic left mammogram on 05/14/2017 revealed grouped pleomorphic calcifications within the lower outer quadrant of the LEFT breast, spanning 7 mm.  Bone density study on 11/22/2014 revealed osteopenia with a T-score of -2.4 in the AP spine L1-L4.  Bone density on 06/02/2017 revealed osteopenia with a T-score of -2.1 in the AP spine L1-L4 and -1.9 in the right femoral neck.  She a mild normocytic anemia.  She received a blood transfusion 6 years ago.  She took oral iron x 2 years, but stopped >  1 month ago.  Labs on 06/02/2017 revealed a ferritin 43 with normal iron studies.  Folate was 9.6.  She takes oral B12.    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  Colonoscopy on 09/09/2013 revealed an 8 mm sessile polyp in the cecum (tubular adenoma).  Symptomatically, patient doing well. She is taking her tamoxifen and experiencing some minor fatigue and vasomotor symptoms. No other acute complaints. Exam is stable.   Plan: 1. Labs today: CBC with diff, CMP, B12. 2. Discuss post-operative follow up visit with surgery - healing well with no complications.  3. Discuss hormonal therapy. Patient notes fatigue with the medication, however is amenable to continuing with therapy. Will send new Rx for #90 r3. 4. Discuss osteopenia.  Encouraged patient to talke calcium 1200 mg and vitamin D 800 IU daily. 5. Discuss symptomatic anemia.  Hemoglobin has 10.9. Taking oral B12 at this point. Repeat level checked today. We will plan on calling patient with result. If low, she will need to make the decision whether or not she is willing to pursue parenteral supplementation.  6. Routine surveillance schedule reviewed with patient. Patient will be seen in the medical oncology clinic every 3 months for the first year, every 4 months for years 2-3, every 6 months for years 4-5, and then annually thereafter.  7. RTC in 3 months for MD assessment and labs (CBC with diff, CMP).   Honor Loh, NP  08/01/2017, 12:40 PM

## 2017-07-31 ENCOUNTER — Other Ambulatory Visit: Payer: Self-pay | Admitting: *Deleted

## 2017-07-31 DIAGNOSIS — D0512 Intraductal carcinoma in situ of left breast: Secondary | ICD-10-CM

## 2017-08-01 ENCOUNTER — Inpatient Hospital Stay: Payer: Medicare HMO | Attending: Hematology and Oncology

## 2017-08-01 ENCOUNTER — Telehealth: Payer: Self-pay | Admitting: *Deleted

## 2017-08-01 ENCOUNTER — Inpatient Hospital Stay (HOSPITAL_BASED_OUTPATIENT_CLINIC_OR_DEPARTMENT_OTHER): Payer: Medicare HMO | Admitting: Urgent Care

## 2017-08-01 VITALS — BP 131/75 | HR 55 | Temp 97.8°F | Resp 18 | Wt 157.4 lb

## 2017-08-01 DIAGNOSIS — R232 Flushing: Secondary | ICD-10-CM

## 2017-08-01 DIAGNOSIS — Z9012 Acquired absence of left breast and nipple: Secondary | ICD-10-CM | POA: Diagnosis not present

## 2017-08-01 DIAGNOSIS — D0512 Intraductal carcinoma in situ of left breast: Secondary | ICD-10-CM

## 2017-08-01 DIAGNOSIS — E538 Deficiency of other specified B group vitamins: Secondary | ICD-10-CM | POA: Diagnosis not present

## 2017-08-01 DIAGNOSIS — M8580 Other specified disorders of bone density and structure, unspecified site: Secondary | ICD-10-CM | POA: Insufficient documentation

## 2017-08-01 DIAGNOSIS — D649 Anemia, unspecified: Secondary | ICD-10-CM | POA: Insufficient documentation

## 2017-08-01 DIAGNOSIS — R5383 Other fatigue: Secondary | ICD-10-CM | POA: Diagnosis not present

## 2017-08-01 DIAGNOSIS — R0609 Other forms of dyspnea: Secondary | ICD-10-CM | POA: Insufficient documentation

## 2017-08-01 DIAGNOSIS — N951 Menopausal and female climacteric states: Secondary | ICD-10-CM | POA: Insufficient documentation

## 2017-08-01 DIAGNOSIS — T451X5A Adverse effect of antineoplastic and immunosuppressive drugs, initial encounter: Secondary | ICD-10-CM

## 2017-08-01 LAB — CBC WITH DIFFERENTIAL/PLATELET
Basophils Absolute: 0.1 10*3/uL (ref 0–0.1)
Basophils Relative: 1 %
EOS ABS: 0.1 10*3/uL (ref 0–0.7)
EOS PCT: 2 %
HCT: 33.8 % — ABNORMAL LOW (ref 35.0–47.0)
Hemoglobin: 10.9 g/dL — ABNORMAL LOW (ref 12.0–16.0)
LYMPHS ABS: 1.3 10*3/uL (ref 1.0–3.6)
LYMPHS PCT: 25 %
MCH: 27.6 pg (ref 26.0–34.0)
MCHC: 32.3 g/dL (ref 32.0–36.0)
MCV: 85.4 fL (ref 80.0–100.0)
MONOS PCT: 8 %
Monocytes Absolute: 0.4 10*3/uL (ref 0.2–0.9)
Neutro Abs: 3.2 10*3/uL (ref 1.4–6.5)
Neutrophils Relative %: 64 %
PLATELETS: 163 10*3/uL (ref 150–440)
RBC: 3.96 MIL/uL (ref 3.80–5.20)
RDW: 16.3 % — ABNORMAL HIGH (ref 11.5–14.5)
WBC: 5.1 10*3/uL (ref 3.6–11.0)

## 2017-08-01 LAB — COMPREHENSIVE METABOLIC PANEL
ALBUMIN: 4 g/dL (ref 3.5–5.0)
ALT: 12 U/L (ref 0–44)
ANION GAP: 10 (ref 5–15)
AST: 17 U/L (ref 15–41)
Alkaline Phosphatase: 51 U/L (ref 38–126)
BUN: 18 mg/dL (ref 8–23)
CHLORIDE: 102 mmol/L (ref 98–111)
CO2: 24 mmol/L (ref 22–32)
Calcium: 9.2 mg/dL (ref 8.9–10.3)
Creatinine, Ser: 0.8 mg/dL (ref 0.44–1.00)
GFR calc Af Amer: >60 mL/min (ref >60)
GFR calc non Af Amer: >60 mL/min (ref >60)
GLUCOSE: 108 mg/dL — AB (ref 70–99)
POTASSIUM: 3.5 mmol/L (ref 3.5–5.1)
Sodium: 136 mmol/L (ref 135–145)
Total Bilirubin: 0.3 mg/dL (ref 0.3–1.2)
Total Protein: 6.9 g/dL (ref 6.5–8.1)

## 2017-08-01 LAB — VITAMIN B12: VITAMIN B 12: 393 pg/mL (ref 180–914)

## 2017-08-01 MED ORDER — TAMOXIFEN CITRATE 20 MG PO TABS: 20.0000 mg | ORAL_TABLET | Freq: Every day | ORAL | 0 refills | Status: DC

## 2017-08-01 MED ORDER — TAMOXIFEN CITRATE 20 MG PO TABS: 20.0000 mg | ORAL_TABLET | Freq: Every day | ORAL | 3 refills | Status: DC

## 2017-08-01 NOTE — Progress Notes (Signed)
Patient states she gets SOB with exertion.  States she feel tired a lot and when she is she feels shorter of breath.  She takes breaks to rest and then is okay.  States she does have hot flashes.  Patient requesting refill for Tamoxifen.  Pended order.

## 2017-08-01 NOTE — Telephone Encounter (Signed)
Called patient to inform her that her B-12 has improved.  She can continue to take oral B-12, however will not need injections at this time. Patient verbalized understanding.

## 2017-08-01 NOTE — Telephone Encounter (Signed)
-----   Message from Karen Kitchens, NP sent at 08/01/2017  3:20 PM EDT ----- She is improving on her oral B12. She is 393 today. She can stay on oral B12 for now. We can talk about injections in the future.   Gaspar Bidding  ----- Message ----- From: Buel Ream, Lab In [place] Sent: 08/01/2017  10:13 AM To: Karen Kitchens, NP

## 2017-08-08 ENCOUNTER — Other Ambulatory Visit: Payer: Self-pay | Admitting: Family Medicine

## 2017-08-08 NOTE — Telephone Encounter (Signed)
Last filled 03-20-17 #90 Last OV 04-21-17 No Future OV  [employer]

## 2017-08-12 ENCOUNTER — Other Ambulatory Visit: Payer: Self-pay | Admitting: Urgent Care

## 2017-08-12 ENCOUNTER — Inpatient Hospital Stay: Payer: Medicare HMO

## 2017-08-12 DIAGNOSIS — D649 Anemia, unspecified: Secondary | ICD-10-CM | POA: Diagnosis not present

## 2017-08-12 DIAGNOSIS — M8580 Other specified disorders of bone density and structure, unspecified site: Secondary | ICD-10-CM | POA: Diagnosis not present

## 2017-08-12 DIAGNOSIS — E538 Deficiency of other specified B group vitamins: Secondary | ICD-10-CM

## 2017-08-12 DIAGNOSIS — Z9012 Acquired absence of left breast and nipple: Secondary | ICD-10-CM | POA: Diagnosis not present

## 2017-08-12 DIAGNOSIS — R0609 Other forms of dyspnea: Secondary | ICD-10-CM | POA: Diagnosis not present

## 2017-08-12 DIAGNOSIS — N951 Menopausal and female climacteric states: Secondary | ICD-10-CM | POA: Diagnosis not present

## 2017-08-12 DIAGNOSIS — D0512 Intraductal carcinoma in situ of left breast: Secondary | ICD-10-CM | POA: Diagnosis not present

## 2017-08-12 DIAGNOSIS — R5383 Other fatigue: Secondary | ICD-10-CM | POA: Diagnosis not present

## 2017-08-12 MED ORDER — CYANOCOBALAMIN 1000 MCG/ML IJ SOLN
1000.0000 ug | Freq: Once | INTRAMUSCULAR | Status: AC
  Administered 2017-08-12: 1000 ug via INTRAMUSCULAR
  Filled 2017-08-12: qty 1

## 2017-08-12 NOTE — Progress Notes (Signed)
Re: B12 injections  Patient previously found to be B12 deficient. She was started on oral supplementation and recheck a month later. Her numbers had minimally improved. Her husband is also a patient here and received parenteral B12 supplementation. Patient states, "It makes him feel so much better. I would rather do the shots".   Given her levels, this intervention would be appropriate for her. Orders placed for B12 1,000 mcg IM x 6 weeks, then monthly. I have contacted financials, and the medication has been approved. Additionally, I have notified pharmacy.   Patient to receive her first injection while here with her husband today in clinic.   Honor Loh, MSN, APRN, FNP-C, CEN Oncology/Hematology Nurse Practitioner  [facility] 08/12/17, 4:22 PM

## 2017-08-12 NOTE — Progress Notes (Signed)
Message received for Ashley Burkitt, RN that patient is an add on today to start B12 injections.

## 2017-08-13 ENCOUNTER — Other Ambulatory Visit: Payer: Self-pay | Admitting: Family Medicine

## 2017-08-14 NOTE — Telephone Encounter (Signed)
Last Filled 02/20/17 # 90 refills 0  Last OV 04/21/17 with Dr. Lorelei Pont

## 2017-08-15 DIAGNOSIS — H401132 Primary open-angle glaucoma, bilateral, moderate stage: Secondary | ICD-10-CM | POA: Diagnosis not present

## 2017-08-23 ENCOUNTER — Encounter: Payer: Self-pay | Admitting: Urgent Care

## 2017-08-23 ENCOUNTER — Other Ambulatory Visit: Payer: Self-pay | Admitting: Urgent Care

## 2017-09-09 ENCOUNTER — Other Ambulatory Visit: Payer: Self-pay | Admitting: Family Medicine

## 2017-09-09 ENCOUNTER — Inpatient Hospital Stay: Payer: Medicare HMO | Attending: Hematology and Oncology

## 2017-09-09 DIAGNOSIS — E538 Deficiency of other specified B group vitamins: Secondary | ICD-10-CM

## 2017-09-09 MED ORDER — CYANOCOBALAMIN 1000 MCG/ML IJ SOLN
1000.0000 ug | Freq: Once | INTRAMUSCULAR | Status: AC
  Administered 2017-09-09: 1000 ug via INTRAMUSCULAR
  Filled 2017-09-09: qty 1

## 2017-10-07 ENCOUNTER — Inpatient Hospital Stay: Payer: Medicare HMO | Attending: Hematology and Oncology

## 2017-10-07 DIAGNOSIS — E538 Deficiency of other specified B group vitamins: Secondary | ICD-10-CM | POA: Insufficient documentation

## 2017-10-07 MED ORDER — CYANOCOBALAMIN 1000 MCG/ML IJ SOLN
1000.0000 ug | Freq: Once | INTRAMUSCULAR | Status: AC
  Administered 2017-10-07: 1000 ug via INTRAMUSCULAR

## 2017-10-28 ENCOUNTER — Other Ambulatory Visit: Payer: Self-pay | Admitting: Family Medicine

## 2017-10-30 ENCOUNTER — Ambulatory Visit (INDEPENDENT_AMBULATORY_CARE_PROVIDER_SITE_OTHER): Payer: Medicare HMO | Admitting: Family Medicine

## 2017-10-30 ENCOUNTER — Encounter: Payer: Self-pay | Admitting: Family Medicine

## 2017-10-30 VITALS — BP 174/84 | HR 61 | Temp 98.5°F | Ht 61.0 in | Wt 164.8 lb

## 2017-10-30 DIAGNOSIS — Z23 Encounter for immunization: Secondary | ICD-10-CM

## 2017-10-30 DIAGNOSIS — R600 Localized edema: Secondary | ICD-10-CM | POA: Diagnosis not present

## 2017-10-30 DIAGNOSIS — M1711 Unilateral primary osteoarthritis, right knee: Secondary | ICD-10-CM | POA: Diagnosis not present

## 2017-10-30 MED ORDER — FUROSEMIDE 20 MG PO TABS: 20.0000 mg | ORAL_TABLET | Freq: Every day | ORAL | 1 refills | Status: DC

## 2017-10-30 MED ORDER — METHYLPREDNISOLONE ACETATE 40 MG/ML IJ SUSP
80.0000 mg | Freq: Once | INTRAMUSCULAR | Status: AC
  Administered 2017-10-30: 80 mg via INTRA_ARTICULAR

## 2017-10-30 NOTE — Progress Notes (Signed)
Dr. Frederico Hamman T. Mayrene Bastarache, MD, [facility] [place], [address] Phone: [phone] Fax: [phone]  10/30/2017  Patient: Ashley Ryan, MRN: [id number], DOB: 1945-06-09, 72 y.o.  Primary Physician:  [name], MD   Chief Complaint  Patient presents with  . Knee Pain    Right  . Edema    Feet   Subjective:   Ashley Ryan is a 72 y.o. very pleasant female patient who presents with the following:  R knee and OA hurting a lot. She has been having R knee pain with some swelling intermittently and an occaisional giving way. Most of her pain is medial. She is already taking tramadol, lidocaine 5%, neurontin.  Had breast cancer. Recently started taking Tamoxifen. She has been gaining weight and had an increase in lower extremity edema and she was wondering about that.   Wt Readings from Last 3 Encounters:  10/30/17 164 lb 12 oz (74.7 kg)  08/01/17 157 lb 7 oz (71.4 kg)  07/22/17 155 lb (70.3 kg)    Past Medical History, Surgical History, Social History, Family History, Problem List, Medications, and Allergies have been reviewed and updated if relevant.  Patient Active Problem List   Diagnosis Date Noted  . B12 deficiency 07/24/2017  . Osteopenia of spine 07/03/2017  . Anemia 07/03/2017  . 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 Influenza 03/18/2017  . Foot pain 02/11/2017  . Right knee pain 12/23/2016  . Bilateral foot pain 05/21/2016  . Counseling regarding end of life decision making 11/10/2014  . Closed fracture of fourth metatarsal of right foot 11/01/2014  . 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 GERD (gastroesophageal reflux disease) 10/07/2013  . Stricture and stenosis of esophagus 10/07/2013  . Iron deficiency anemia 09/08/2013  . De Quervain's tenosynovitis, left 07/05/2011  . Diabetic peripheral neuropathy ([place]) 12/27/2009  . Osteoarthritis, bilateral knees 11/23/2009  . Controlled type  2 diabetes with neuropathy ([place]) 02/03/2009  . High cholesterol 02/03/2009  . COMMON MIGRAINE 11/03/2008  . ESSENTIAL HYPERTENSION, BENIGN 11/03/2008  . ALLERGIC RHINITIS DUE TO POLLEN 11/03/2008  . HIATAL HERNIA WITH REFLUX 11/03/2008  . DIVERTICULOSIS OF COLON 11/03/2008  . MURMUR 11/03/2008  . GASTRIC ULCER, HX OF 11/03/2008    Past Medical History:  Diagnosis Date  . Anemia   . Arthritis   . Chronic headache   . Complication of anesthesia    prior to 1991 used to have PONV.  none recently.  . Diverticulosis of colon   . Family history of adverse reaction to anesthesia    mother/daighter get sick  . GERD with stricture   . Hard of hearing   . Heart murmur    followed by PCP  . Hiatal hernia   . Hypertension   . Neuropathy    feet  . Osteoporosis   . Pre-diabetes   . Wears dentures    full upper    Past Surgical History:  Procedure Laterality Date  . ABDOMINAL HYSTERECTOMY  1978   one ovary remains  . BREAST BIOPSY Left 05/26/2017   Affirm Bx- path pending - coil clip  . CARDIAC CATHETERIZATION     "yrs ago" all OK.  Marland Kitchen CATARACT EXTRACTION W/PHACO Left 02/21/2016   Procedure: CATARACT EXTRACTION PHACO AND INTRAOCULAR LENS PLACEMENT ([place])  Left eye;  Surgeon: Leandrew Koyanagi, MD;  Location: [facility];  Service: Ophthalmology;  Laterality: Left;  Left eye Diabetic  .  CATARACT EXTRACTION W/PHACO Right 03/13/2016   Procedure: CATARACT EXTRACTION PHACO AND INTRAOCULAR LENS PLACEMENT ([place])  right  diabetic;  Surgeon: Leandrew Koyanagi, MD;  Location: [facility];  Service: Ophthalmology;  Laterality: Right;  diabetic  . CHOLECYSTECTOMY  1978  . OOPHORECTOMY     one still left  . SENTINEL NODE BIOPSY Left 06/13/2017   Procedure: SENTINEL NODE BIOPSY;  Surgeon: Robert Bellow, MD;  Location: [facility];  Service: General;  Laterality: Left;  . SIMPLE MASTECTOMY WITH AXILLARY SENTINEL NODE BIOPSY Left 06/13/2017   8 mm high grade DCIS, negative SLN.  ER/PR+.  Surgeon: Robert Bellow, MD;  Location: [facility];  Service: General;  Laterality: Left;    Social History   Socioeconomic History  . Marital status: Married    Spouse name: Not on file  . Number of children: 2  . Years of education: Not on file  . Highest education level: Not on file  Occupational History  . Occupation: retired Forensic psychologist: RETIRED  Social Needs  . Financial resource strain: Not on file  . Food insecurity:    Worry: Not on file    Inability: Not on file  . Transportation needs:    Medical: Not on file    Non-medical: Not on file  Tobacco Use  . Smoking status: Never Smoker  . Smokeless tobacco: Never Used  Substance and Sexual Activity  . Alcohol use: No  . Drug use: No  . Sexual activity: Not Currently  Lifestyle  . Physical activity:    Days per week: Not on file    Minutes per session: Not on file  . Stress: Not on file  Relationships  . Social connections:    Talks on phone: Not on file    Gets together: Not on file    Attends religious service: Not on file    Active member of club or organization: Not on file    Attends meetings of clubs or organizations: Not on file    Relationship status: Not on file  . Intimate partner violence:    Fear of current or ex partner: Not on file    Emotionally abused: Not on file    Physically abused: Not on file    Forced sexual activity: Not on file  Other Topics Concern  . Not on file  Social History Narrative   Drinks sweet tea, diet sodas 4 a day   No living will, full code (reviewed 2014)    Family History  Problem Relation Age of Onset  . Breast cancer Unknown   . Stomach cancer Unknown   . Diabetes Unknown   . Heart disease Unknown   . Breast cancer Sister 57  . Esophageal cancer Brother     Allergies  Allergen Reactions  . Sulfa Antibiotics Rash    Childhood reaction  . Sulfonamide Derivatives Rash    Childhood reaction.    Medication list reviewed and updated in  full in [place].  GEN: No fevers, chills. Nontoxic. Primarily MSK c/o today. MSK: Detailed in the HPI GI: tolerating PO intake without difficulty Neuro: No numbness, parasthesias, or tingling associated. Otherwise the pertinent positives of the ROS are noted above.   Objective:   BP (!) 174/84   Pulse 61   Temp 98.5 F (36.9 C) (Oral)   Ht '5\' 1"'$  (1.549 m)   Wt 164 lb 12 oz (74.7 kg)   BMI 31.13 kg/m    GEN: WDWN,  NAD, Non-toxic, Alert & Oriented x 3 HEENT: Atraumatic, Normocephalic.  Ears and Nose: No external deformity. EXTR: No clubbing/cyanosis/tr - 1+ LE edema NEURO: Normal gait.  PSYCH: Normally interactive. Conversant. Not depressed or anxious appearing.  Calm demeanor.   Knee:  R Gait: Normal heel toe pattern ROM: 0-117 Effusion: neg Echymosis or edema: none Patellar tendon NT Painful PLICA: neg Patellar grind: negative Medial and lateral patellar facet loading: negative medial and lateral joint lines: medial joint line pain Mcmurray's pain Flexion-pinch pain Varus and valgus stress: stable Lachman: neg Ant and Post drawer: neg Hip abduction, IR, ER: WNL Hip flexion str: 5/5 Hip abd: 5/5 Quad: 5/5 VMO atrophy:No Hamstring concentric and eccentric: 5/5   Radiology: Results for orders placed or performed in visit on 08/01/17  Comprehensive metabolic panel  Result Value Ref Range   Sodium 136 135 - 145 mmol/L   Potassium 3.5 3.5 - 5.1 mmol/L   Chloride 102 98 - 111 mmol/L   CO2 24 22 - 32 mmol/L   Glucose, Bld 108 (H) 70 - 99 mg/dL   BUN 18 8 - 23 mg/dL   Creatinine, Ser 0.80 0.44 - 1.00 mg/dL   Calcium 9.2 8.9 - 10.3 mg/dL   Total Protein 6.9 6.5 - 8.1 g/dL   Albumin 4.0 3.5 - 5.0 g/dL   AST 17 15 - 41 U/L   ALT 12 0 - 44 U/L   Alkaline Phosphatase 51 38 - 126 U/L   Total Bilirubin 0.3 0.3 - 1.2 mg/dL   GFR calc non Af Amer >60 >60 mL/min   GFR calc Af Amer >60 >60 mL/min   Anion gap 10 5 - 15  CBC with Differential  Result Value  Ref Range   WBC 5.1 3.6 - 11.0 K/uL   RBC 3.96 3.80 - 5.20 MIL/uL   Hemoglobin 10.9 (L) 12.0 - 16.0 g/dL   HCT 33.8 (L) 35.0 - 47.0 %   MCV 85.4 80.0 - 100.0 fL   MCH 27.6 26.0 - 34.0 pg   MCHC 32.3 32.0 - 36.0 g/dL   RDW 16.3 (H) 11.5 - 14.5 %   Platelets 163 150 - 440 K/uL   Neutrophils Relative % 64 %   Neutro Abs 3.2 1.4 - 6.5 K/uL   Lymphocytes Relative 25 %   Lymphs Abs 1.3 1.0 - 3.6 K/uL   Monocytes Relative 8 %   Monocytes Absolute 0.4 0.2 - 0.9 K/uL   Eosinophils Relative 2 %   Eosinophils Absolute 0.1 0 - 0.7 K/uL   Basophils Relative 1 %   Basophils Absolute 0.1 0 - 0.1 K/uL  Vitamin B12  Result Value Ref Range   Vitamin B-12 393 180 - 914 pg/mL     Assessment and Plan:   Primary osteoarthritis of right knee - Plan: methylPREDNISolone acetate (DEPO-MEDROL) injection 80 mg  Need for prophylactic vaccination and inoculation against influenza - Plan: Flu Vaccine QUAD 6+ mos PF IM (Fluarix Quad PF)  Bilateral lower extremity edema  Cont with basic exercise, weight management for OA.  Recent weight gain and edema I expect is from Tamoxifen, but instructed her to keep taking. Compression socks first. Prn lasix if this does not help enough seems reasonable.  She is  Already on HCTZ  Patient Instructions  Go to a sports shoe store like [employer].  Compression socks - like the type that you use with running and sports.    Knee Injection, R Date of procedure: 10/30/2017 Patient verbally consented to  procedure. Risks (including potential rare risk of infection), benefits, and alternatives explained. Sterilely prepped with Chloraprep. Ethyl cholride used for anesthesia. 8 cc Lidocaine 1% mixed with 2 mL Depo-Medrol 40 mg injected using the anteromedial approach without difficulty. No complications with procedure and tolerated well. Patient had decreased pain post-injection. Medication: 2 mL of Depo-Medrol 40 mg, equaling Depo-Medrol 80 mg total   Follow-up: No  follow-ups on file.  Meds ordered this encounter  Medications  . furosemide (LASIX) 20 MG tablet    Sig: Take 1 tablet (20 mg total) by mouth daily. Prn lower extremity edema    Dispense:  30 tablet    Refill:  1  . methylPREDNISolone acetate (DEPO-MEDROL) injection 80 mg   Orders Placed This Encounter  Procedures  . Flu Vaccine QUAD 6+ mos PF IM (Fluarix Quad PF)    Signed,  Mykal Kirchman T. Sheyanne Munley, MD   Allergies as of 10/30/2017      Reactions   Sulfa Antibiotics Rash   Childhood reaction   Sulfonamide Derivatives Rash   Childhood reaction.      Medication List        Accurate as of 10/30/17 11:59 PM. Always use your most recent med list.          ACCU-CHEK AVIVA PLUS w/Device Kit Use to check blood sugar once daily   ACCU-CHEK AVIVA Soln Use to check blood sugar once daily   ACCU-CHEK SOFTCLIX LANCETS lancets Use to check blood sugar once daily   amLODipine 5 MG tablet Commonly known as:  NORVASC Take 1 tablet (5 mg total) by mouth daily.   aspirin EC 81 MG tablet Take 81 mg by mouth daily.   atenolol 100 MG tablet Commonly known as:  TENORMIN TAKE 1 TABLET EVERY DAY   atorvastatin 40 MG tablet Commonly known as:  LIPITOR TAKE 1 TABLET EVERY DAY at bedtime   B-D SINGLE USE SWABS REGULAR Pads Use to check blood sugar once daily   CALCIUM 600+D 600-400 MG-UNIT tablet Generic drug:  Calcium Carbonate-Vitamin D Take 1 tablet by mouth 2 (two) times daily.   esomeprazole 40 MG capsule Commonly known as:  NEXIUM TAKE 1 CAPSULE 2 (TWO) TIMES DAILY BEFORE A MEAL.   FIBER-CAPS PO Take 1 capsule by mouth daily.   Fish Oil 1000 MG Caps Take 1,000 mg by mouth daily.   FLUoxetine 20 MG capsule Commonly known as:  PROZAC TAKE 1 CAPSULE EVERY DAY   fluticasone 50 MCG/ACT nasal spray Commonly known as:  FLONASE PLACE 2 SPRAYS INTO THE NOSE DAILY AS DIRECTED   furosemide 20 MG tablet Commonly known as:  LASIX Take 1 tablet (20 mg total) by mouth  daily. Prn lower extremity edema   gabapentin 100 MG capsule Commonly known as:  NEURONTIN TAKE 1 CAPSULE IN THE MORNING, 1 CAPSULE AT LUNCH, 1 CAPSULES AT DINNER AND 2 TO 3 CAPSULES AT BEDTIME   Glucosamine 500 MG Tabs Take 500 mg by mouth daily.   glucose blood test strip Use to check blood sugar once daily   latanoprost 0.005 % ophthalmic solution Commonly known as:  XALATAN Place 1 drop into both eyes at bedtime.   lidocaine 5 % Commonly known as:  LIDODERM APPLY 1 PATCH ONTO THE SKIN EVERY DAY. REMOVE AND DISCARD PATCH WITHIN 12 HOURS OR AS DIRECTED BY PHYSICIAN.   loratadine 10 MG tablet Commonly known as:  CLARITIN Take 10 mg by mouth daily.   losartan-hydrochlorothiazide 100-25 MG tablet Commonly known as:  HYZAAR  TAKE 1 TABLET EVERY DAY   phenylephrine 10 MG Tabs tablet Commonly known as:  SUDAFED PE Take 10 mg by mouth every 4 (four) hours as needed (for nasal congestion.).   tamoxifen 20 MG tablet Commonly known as:  NOLVADEX Take 1 tablet (20 mg total) by mouth daily.   traMADol 50 MG tablet Commonly known as:  ULTRAM Take 1 tablet (50 mg total) by mouth every 6 (six) hours as needed. for pain   vitamin B-12 1000 MCG tablet Commonly known as:  CYANOCOBALAMIN Take 1 tablet (1,000 mcg total) by mouth daily.

## 2017-10-30 NOTE — Patient Instructions (Signed)
Go to a sports shoe store like [employer].  Compression socks - like the type that you use with running and sports.

## 2017-10-31 ENCOUNTER — Telehealth: Payer: Self-pay | Admitting: *Deleted

## 2017-10-31 ENCOUNTER — Inpatient Hospital Stay: Payer: Medicare HMO

## 2017-10-31 ENCOUNTER — Other Ambulatory Visit: Payer: Self-pay | Admitting: Hematology and Oncology

## 2017-10-31 ENCOUNTER — Encounter: Payer: Self-pay | Admitting: Hematology and Oncology

## 2017-10-31 ENCOUNTER — Telehealth: Payer: Self-pay

## 2017-10-31 ENCOUNTER — Ambulatory Visit
Admission: RE | Admit: 2017-10-31 | Discharge: 2017-10-31 | Disposition: A | Discharge status: Home / Self Care | Payer: Medicare HMO | Source: Ambulatory Visit | Attending: Urgent Care | Admitting: Urgent Care

## 2017-10-31 ENCOUNTER — Inpatient Hospital Stay: Payer: Medicare HMO | Attending: Hematology and Oncology

## 2017-10-31 ENCOUNTER — Inpatient Hospital Stay (HOSPITAL_BASED_OUTPATIENT_CLINIC_OR_DEPARTMENT_OTHER): Payer: Medicare HMO | Admitting: Hematology and Oncology

## 2017-10-31 VITALS — BP 183/76 | HR 63 | Temp 98.3°F | Resp 18 | Wt 168.4 lb

## 2017-10-31 DIAGNOSIS — M79605 Pain in left leg: Secondary | ICD-10-CM | POA: Diagnosis not present

## 2017-10-31 DIAGNOSIS — D649 Anemia, unspecified: Secondary | ICD-10-CM

## 2017-10-31 DIAGNOSIS — D509 Iron deficiency anemia, unspecified: Secondary | ICD-10-CM

## 2017-10-31 DIAGNOSIS — M7989 Other specified soft tissue disorders: Secondary | ICD-10-CM

## 2017-10-31 DIAGNOSIS — M8580 Other specified disorders of bone density and structure, unspecified site: Secondary | ICD-10-CM

## 2017-10-31 DIAGNOSIS — E538 Deficiency of other specified B group vitamins: Secondary | ICD-10-CM

## 2017-10-31 DIAGNOSIS — Z9012 Acquired absence of left breast and nipple: Secondary | ICD-10-CM | POA: Diagnosis not present

## 2017-10-31 DIAGNOSIS — M79606 Pain in leg, unspecified: Secondary | ICD-10-CM

## 2017-10-31 DIAGNOSIS — M79604 Pain in right leg: Secondary | ICD-10-CM | POA: Insufficient documentation

## 2017-10-31 DIAGNOSIS — R600 Localized edema: Secondary | ICD-10-CM | POA: Diagnosis not present

## 2017-10-31 DIAGNOSIS — D0512 Intraductal carcinoma in situ of left breast: Secondary | ICD-10-CM

## 2017-10-31 LAB — CBC WITH DIFFERENTIAL/PLATELET
ABS IMMATURE GRANULOCYTES: 0.02 10*3/uL (ref 0.00–0.07)
Basophils Absolute: 0 10*3/uL (ref 0.0–0.1)
Basophils Relative: 1 %
Eosinophils Absolute: 0 10*3/uL (ref 0.0–0.5)
Eosinophils Relative: 1 %
HCT: 32 % — ABNORMAL LOW (ref 36.0–46.0)
HEMOGLOBIN: 9.7 g/dL — AB (ref 12.0–15.0)
Immature Granulocytes: 1 %
LYMPHS ABS: 0.8 10*3/uL (ref 0.7–4.0)
LYMPHS PCT: 19 %
MCH: 25.9 pg — AB (ref 26.0–34.0)
MCHC: 30.3 g/dL (ref 30.0–36.0)
MCV: 85.6 fL (ref 80.0–100.0)
MONOS PCT: 10 %
Monocytes Absolute: 0.4 10*3/uL (ref 0.1–1.0)
Neutro Abs: 2.8 10*3/uL (ref 1.7–7.7)
Neutrophils Relative %: 68 %
Platelets: 132 10*3/uL — ABNORMAL LOW (ref 150–400)
RBC: 3.74 MIL/uL — AB (ref 3.87–5.11)
RDW: 16.1 % — ABNORMAL HIGH (ref 11.5–15.5)
WBC: 4 10*3/uL (ref 4.0–10.5)
nRBC: 0 % (ref 0.0–0.2)

## 2017-10-31 LAB — COMPREHENSIVE METABOLIC PANEL
ALBUMIN: 3.7 g/dL (ref 3.5–5.0)
ALT: 15 U/L (ref 0–44)
AST: 16 U/L (ref 15–41)
Alkaline Phosphatase: 46 U/L (ref 38–126)
Anion gap: 7 (ref 5–15)
BUN: 10 mg/dL (ref 8–23)
CHLORIDE: 106 mmol/L (ref 98–111)
CO2: 26 mmol/L (ref 22–32)
CREATININE: 0.52 mg/dL (ref 0.44–1.00)
Calcium: 8.8 mg/dL — ABNORMAL LOW (ref 8.9–10.3)
GFR calc Af Amer: >60 mL/min (ref >60)
GFR calc non Af Amer: >60 mL/min (ref >60)
GLUCOSE: 103 mg/dL — AB (ref 70–99)
POTASSIUM: 3.5 mmol/L (ref 3.5–5.1)
Sodium: 139 mmol/L (ref 135–145)
Total Bilirubin: 0.4 mg/dL (ref 0.3–1.2)
Total Protein: 6.2 g/dL — ABNORMAL LOW (ref 6.5–8.1)

## 2017-10-31 LAB — FERRITIN: Ferritin: 35 ng/mL (ref 11–307)

## 2017-10-31 LAB — IRON AND TIBC
Iron: 29 ug/dL (ref 28–170)
Saturation Ratios: 11 % (ref 10.4–31.8)
TIBC: 268 ug/dL (ref 250–450)
UIBC: 239 ug/dL

## 2017-10-31 MED ORDER — CYANOCOBALAMIN 1000 MCG/ML IJ SOLN
1000.0000 ug | Freq: Once | INTRAMUSCULAR | Status: AC
  Administered 2017-10-31: 1000 ug via INTRAMUSCULAR
  Filled 2017-10-31: qty 1

## 2017-10-31 NOTE — Telephone Encounter (Signed)
Spoke with the patient to inform her that her DVT was negative and for her to elevate her legs and consider compression stocking. I have also asked for her to call if symptoms get change. The patient was understanding and agreeable.

## 2017-10-31 NOTE — Progress Notes (Signed)
[facility] day:  10/31/2017   Chief Complaint: Ashley Ryan is a 72 y.o. female with left breast DCIS s/p mastectomy and B12 deficiency who is seen for 3 month assessment on tamoxifen.  HPI:  The patient was last seen in the medical oncology clinic on 08/01/2017. At that time, she was doing well. She was taking her tamoxifen and experiencing some minor fatigue and vasomotor symptoms. Exam was stable.  B12 was 393 on oral B12.  She made the decision to switch to B12 injections.  She received B12 monthly x 3 (08/12/2017 - 10/07/2017).  During the interim, she has done well.  She notes some discomfort in the left lateral breast.  She had an injection in her knee yesterday.  She has had left lower extremity swelling this week.  She denies any shortness of breath or cough.   Past Medical History:  Diagnosis Date  . Anemia   . Arthritis   . Chronic headache   . Complication of anesthesia    prior to 1991 used to have PONV.  none recently.  . Diverticulosis of colon   . Family history of adverse reaction to anesthesia    mother/daighter get sick  . GERD with stricture   . Hard of hearing   . Heart murmur    followed by PCP  . Hiatal hernia   . Hypertension   . Neuropathy    feet  . Osteoporosis   . Pre-diabetes   . Wears dentures    full upper    Past Surgical History:  Procedure Laterality Date  . ABDOMINAL HYSTERECTOMY  1978   one ovary remains  . BREAST BIOPSY Left 05/26/2017   Affirm Bx- path pending - coil clip  . CARDIAC CATHETERIZATION     "yrs ago" all OK.  Marland Kitchen CATARACT EXTRACTION W/PHACO Left 02/21/2016   Procedure: CATARACT EXTRACTION PHACO AND INTRAOCULAR LENS PLACEMENT ([place])  Left eye;  Surgeon: Leandrew Koyanagi, MD;  Location: [facility];  Service: Ophthalmology;  Laterality: Left;  Left eye Diabetic  . CATARACT EXTRACTION W/PHACO Right 03/13/2016   Procedure: CATARACT EXTRACTION PHACO AND INTRAOCULAR LENS  PLACEMENT ([facility])  right  diabetic;  Surgeon: Leandrew Koyanagi, MD;  Location: [facility];  Service: Ophthalmology;  Laterality: Right;  diabetic  . CHOLECYSTECTOMY  1978  . OOPHORECTOMY     one still left  . SENTINEL NODE BIOPSY Left 06/13/2017   Procedure: SENTINEL NODE BIOPSY;  Surgeon: Robert Bellow, MD;  Location: [facility];  Service: General;  Laterality: Left;  . 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  Surgeon: Robert Bellow, MD;  Location: [facility];  Service: General;  Laterality: Left;    Family History  Problem Relation Age of Onset  . Breast cancer Unknown   . Stomach cancer Unknown   . Diabetes Unknown   . Heart disease Unknown   . Breast cancer Sister 76  . Esophageal cancer Brother     Social History:  reports that she has never smoked. She has never used smokeless tobacco. She reports that she does not drink alcohol or use drugs.  Her husband's name is Scientist, research (medical).  She is a retired [employer] manager.  She lives in [place].  The patient is alone today.  Allergies:  Allergies  Allergen Reactions  . Sulfa Antibiotics Rash    Childhood reaction  . Sulfonamide Derivatives Rash  Childhood reaction.    Current Medications: Current Outpatient Medications  Medication Sig Dispense Refill  . Alcohol Swabs (B-D SINGLE USE SWABS REGULAR) PADS Use to check blood sugar once daily 100 each 3  . amLODipine (NORVASC) 5 MG tablet Take 1 tablet (5 mg total) by mouth daily. 90 tablet 3  . aspirin EC 81 MG tablet Take 81 mg by mouth daily.     Marland Kitchen atenolol (TENORMIN) 100 MG tablet TAKE 1 TABLET EVERY DAY 90 tablet 0  . atorvastatin (LIPITOR) 40 MG tablet TAKE 1 TABLET EVERY DAY at bedtime 90 tablet 0  . Blood Glucose Calibration (ACCU-CHEK AVIVA) SOLN Use to check blood sugar once daily 1 each 0  . Blood Glucose Monitoring Suppl (ACCU-CHEK AVIVA PLUS) W/DEVICE KIT Use to check blood sugar once daily 1 kit 0   . Calcium Carbonate-Vitamin D (CALCIUM 600+D) 600-400 MG-UNIT per tablet Take 1 tablet by mouth 2 (two) times daily.     . Calcium Polycarbophil (FIBER-CAPS PO) Take 1 capsule by mouth daily.    Marland Kitchen esomeprazole (NEXIUM) 40 MG capsule TAKE 1 CAPSULE 2 (TWO) TIMES DAILY BEFORE A MEAL. 180 capsule 1  . FLUoxetine (PROZAC) 20 MG capsule TAKE 1 CAPSULE EVERY DAY 90 capsule 1  . furosemide (LASIX) 20 MG tablet Take 1 tablet (20 mg total) by mouth daily. Prn lower extremity edema 30 tablet 1  . gabapentin (NEURONTIN) 100 MG capsule TAKE 1 CAPSULE IN THE MORNING, 1 CAPSULE AT LUNCH, 1 CAPSULES AT DINNER AND 2 TO 3 CAPSULES AT BEDTIME 540 capsule 5  . Glucosamine 500 MG TABS Take 500 mg by mouth daily.     Marland Kitchen loratadine (CLARITIN) 10 MG tablet Take 10 mg by mouth daily.    Marland Kitchen losartan-hydrochlorothiazide (HYZAAR) 100-25 MG tablet TAKE 1 TABLET EVERY DAY (Patient taking differently: TAKE 1 TABLET EVERY DAY AT BEDTIME.) 90 tablet 1  . Omega-3 Fatty Acids (FISH OIL) 1000 MG CAPS Take 1,000 mg by mouth daily.     . tamoxifen (NOLVADEX) 20 MG tablet Take 1 tablet (20 mg total) by mouth daily. 90 tablet 3  . traMADol (ULTRAM) 50 MG tablet Take 1 tablet (50 mg total) by mouth every 6 (six) hours as needed. for pain 90 tablet 0  . vitamin B-12 (CYANOCOBALAMIN) 1000 MCG tablet Take 1 tablet (1,000 mcg total) by mouth daily. 90 tablet 0  . ACCU-CHEK SOFTCLIX LANCETS lancets Use to check blood sugar once daily 100 each 3  . fluticasone (FLONASE) 50 MCG/ACT nasal spray PLACE 2 SPRAYS INTO THE NOSE DAILY AS DIRECTED (Patient not taking: Reported on 10/31/2017) 48 g 3  . glucose blood (ACCU-CHEK AVIVA) test strip Use to check blood sugar once daily (Patient not taking: Reported on 10/31/2017) 100 each 3  . latanoprost (XALATAN) 0.005 % ophthalmic solution Place 1 drop into both eyes at bedtime.    . lidocaine (LIDODERM) 5 % APPLY 1 PATCH ONTO THE SKIN EVERY DAY. REMOVE AND DISCARD PATCH WITHIN 12 HOURS OR AS DIRECTED BY  PHYSICIAN. (Patient not taking: Reported on 10/31/2017) 90 patch 0  . phenylephrine (SUDAFED PE) 10 MG TABS tablet Take 10 mg by mouth every 4 (four) hours as needed (for nasal congestion.).     No current facility-administered medications for this visit.     Review of Systems  Constitutional: Negative for chills, diaphoresis, fever, malaise/fatigue and weight loss (up 11 pounds).  HENT: Negative for congestion, ear discharge, ear pain, nosebleeds, sinus pain, sore throat and tinnitus.  Eyes: Negative.  Negative for blurred vision, double vision, photophobia, pain, discharge and redness.  Respiratory: Positive for shortness of breath (exertional). Negative for cough, hemoptysis and sputum production.   Cardiovascular: Negative.  Negative for chest pain, palpitations, leg swelling and PND.  Gastrointestinal: Negative.  Negative for abdominal pain, blood in stool, constipation, diarrhea, melena, nausea and vomiting.  Genitourinary: Negative.  Negative for dysuria, frequency, hematuria and urgency.  Musculoskeletal: Positive for joint pain. Negative for back pain, falls, myalgias and neck pain.  Skin: Negative.  Negative for itching and rash.  Neurological: Negative for dizziness, tingling, tremors, sensory change, focal weakness, weakness and headaches.  Endo/Heme/Allergies: Does not bruise/bleed easily.       Minor vasomotor symptoms.  Psychiatric/Behavioral: Negative for depression and memory loss. The patient is not nervous/anxious and does not have insomnia.   All other systems reviewed and are negative.  Performance status (ECOG):  1  Vital signs BP (!) 183/76 (BP Location: Right Arm, Patient Position: Sitting)   Pulse 63   Temp 98.3 F (36.8 C) (Tympanic)   Resp 18   Wt 168 lb 6.4 oz (76.4 kg)   SpO2 94%   BMI 31.82 kg/m   Physical Exam  Constitutional: She is oriented to person, place, and time. She appears distressed.  HENT:  Head: Normocephalic and atraumatic.   Mouth/Throat: No oropharyngeal exudate.  Short gray hair.  Eyes: Pupils are equal, round, and reactive to light. EOM are normal. No scleral icterus.  Glasses. Blue eyes.  Neck: Normal range of motion. Neck supple. No JVD present.  Cardiovascular: Normal rate, regular rhythm and normal heart sounds. Exam reveals no gallop and no friction rub.  No murmur heard. Pulmonary/Chest: Effort normal and breath sounds normal. No respiratory distress. She has no wheezes. She has no rales.  Abdominal: Soft. Bowel sounds are normal. She exhibits no distension and no mass. There is no tenderness. There is no rebound and no guarding.  Musculoskeletal: Normal range of motion. She exhibits edema (3+ left lower extremity edema.  2+ right lower extremity edema.). She exhibits no tenderness.  Lymphadenopathy:    She has no cervical adenopathy.    She has no axillary adenopathy.       Right: No inguinal and no supraclavicular adenopathy present.       Left: No inguinal and no supraclavicular adenopathy present.  Neurological: She is alert and oriented to person, place, and time.  Skin: Skin is warm and dry. No rash noted. She is not diaphoretic. No erythema.  LEFT mastectomy.  No erythema or nodularity.  RIGHT sided fibrocystic changes superiorly.  No masses, skin changes or nipple discharge.  Psychiatric: Affect and judgment normal.  Nursing note and vitals reviewed.   Results for orders placed or performed in visit on 10/31/17 (from the past 72 hour(s))  Comprehensive metabolic panel     Status: Abnormal   Collection Time: 10/31/17 10:28 AM  Result Value Ref Range   Sodium 139 135 - 145 mmol/L   Potassium 3.5 3.5 - 5.1 mmol/L   Chloride 106 98 - 111 mmol/L   CO2 26 22 - 32 mmol/L   Glucose, Bld 103 (H) 70 - 99 mg/dL   BUN 10 8 - 23 mg/dL   Creatinine, Ser 0.52 0.44 - 1.00 mg/dL   Calcium 8.8 (L) 8.9 - 10.3 mg/dL   Total Protein 6.2 (L) 6.5 - 8.1 g/dL   Albumin 3.7 3.5 - 5.0 g/dL   AST 16 15 - 41  U/L  ALT 15 0 - 44 U/L   Alkaline Phosphatase 46 38 - 126 U/L   Total Bilirubin 0.4 0.3 - 1.2 mg/dL   GFR calc non Af Amer >60 >60 mL/min   GFR calc Af Amer >60 >60 mL/min    Comment: (NOTE) The eGFR has been calculated using the CKD EPI equation. This calculation has not been validated in all clinical situations. eGFR's persistently <60 mL/min signify possible Chronic Kidney Disease.    Anion gap 7 5 - 15    Comment: Performed at [facility], [address] [place], [place] [address]  CBC with Differential     Status: Abnormal   Collection Time: 10/31/17 10:28 AM  Result Value Ref Range   WBC 4.0 4.0 - 10.5 K/uL   RBC 3.74 (L) 3.87 - 5.11 MIL/uL   Hemoglobin 9.7 (L) 12.0 - 15.0 g/dL   HCT 32.0 (L) 36.0 - 46.0 %   MCV 85.6 80.0 - 100.0 fL   MCH 25.9 (L) 26.0 - 34.0 pg   MCHC 30.3 30.0 - 36.0 g/dL   RDW 16.1 (H) 11.5 - 15.5 %   Platelets 132 (L) 150 - 400 K/uL   nRBC 0.0 0.0 - 0.2 %   Neutrophils Relative % 68 %   Neutro Abs 2.8 1.7 - 7.7 K/uL   Lymphocytes Relative 19 %   Lymphs Abs 0.8 0.7 - 4.0 K/uL   Monocytes Relative 10 %   Monocytes Absolute 0.4 0.1 - 1.0 K/uL   Eosinophils Relative 1 %   Eosinophils Absolute 0.0 0.0 - 0.5 K/uL   Basophils Relative 1 %   Basophils Absolute 0.0 0.0 - 0.1 K/uL   Immature Granulocytes 1 %   Abs Immature Granulocytes 0.02 0.00 - 0.07 K/uL    Comment: Performed at [facility], [address], [place], [place] [address]     Assessment:  Ashley Ryan is a 72 y.o. female with left breast DCIS s/p simple mastectomy on 06/13/2017.  Pathology revealed at least 8 mm of grade III DCIS.  Margins were negative.  One sentinel lymph node was negative.  DCIS was ER was + (> 90%) and PR + (75%).  Pathologic stage was Tis N0.  She began tamoxifen on 07/03/2017.  Diagnostic left mammogram on 05/14/2017 revealed grouped pleomorphic calcifications within the lower outer quadrant of the LEFT breast, spanning 7 mm.  Bone density  study on 11/22/2014 revealed osteopenia with a T-score of -2.4 in the AP spine L1-L4.  Bone density on 06/02/2017 revealed osteopenia with a T-score of -2.1 in the AP spine L1-L4 and -1.9 in the right femoral neck.  She a mild normocytic anemia.  She received a blood transfusion 6 years ago.  She took oral iron x 2 years, but stopped > 1 month ago.  Labs on 06/02/2017 revealed a ferritin 43 with normal iron studies.  Folate was 9.6.  She takes oral B12.    Ferritin has been followed: 5.3 on 08/10/2013, 48.2 on 05/14/2016, and 43 on 06/02/2017.  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  Colonoscopy on 09/09/2013 revealed an 8 mm sessile polyp in the cecum (tubular adenoma).  Symptomatically, she notes some left lateral breast discomfort.  Exam reveals 3+ left lower extremity edema and 2+ right lower extremity edema.  Hemoglobin is 9.7.  Plan: 1. Labs today:  CBC with diff, CMP, ferritin, iron studies. 2. Left breast DCIS:  Continue tamoxifen.  Right mammogram due on 05/15/2018. 3. Osteopenia:  Patient taking calcium 1200 mg and vitamin D 800 IU/day. 4. B12 deficiency:  Patient began monthly B12 on 08/12/2017.  Continue monthly B12 (today and monthly). 5.   Iron deficiency:  Hemoglobin is 9.7.  MCV is 85.6.  Ferritin is 35. 6.   Bilateral lower extremity edema:  STAT bilateral lower extremity duplex. 7.   RTC in 6 months for MD assessment and labs (CBC with diff, CMP).  Addendum:  Bilateral lower extremity duplex revealed no evidence of DVT.   Honor Loh, NP  10/31/2017, 11:58 AM   I saw and evaluated the patient, participating in the key portions of the service and reviewing pertinent diagnostic studies and records.  I reviewed the nurse practitioner's note and agree with the findings and the plan.  The assessment and plan were discussed with the patient.  Several questions were asked by the patient  and answered.   Nolon Stalls, MD 10/31/2017,11:58 AM

## 2017-10-31 NOTE — Progress Notes (Signed)
No new changes noted today 

## 2017-10-31 NOTE — Telephone Encounter (Signed)
-----   Message from Karen Kitchens, NP sent at 10/31/2017  2:51 PM EDT ----- Dopplers were negative for DVT. Rest, elevate legs, consider compression stockings.   Call for worsening symptoms or concerns.   Ashley Ryan

## 2017-10-31 NOTE — Telephone Encounter (Signed)
[place] called and states patient Negative for DVT and after trying for 30 minutes to reach someone, she has let patient leave to go home sine she was negative.

## 2017-11-03 ENCOUNTER — Ambulatory Visit: Payer: Self-pay

## 2017-11-03 ENCOUNTER — Other Ambulatory Visit: Payer: Self-pay

## 2017-11-03 ENCOUNTER — Encounter: Payer: Self-pay | Admitting: Emergency Medicine

## 2017-11-03 ENCOUNTER — Emergency Department: Payer: Medicare HMO

## 2017-11-03 ENCOUNTER — Emergency Department
Admission: EM | Admit: 2017-11-03 | Discharge: 2017-11-03 | Disposition: A | Discharge status: Home / Self Care | Payer: Medicare HMO | Attending: Emergency Medicine | Admitting: Emergency Medicine

## 2017-11-03 DIAGNOSIS — E119 Type 2 diabetes mellitus without complications: Secondary | ICD-10-CM | POA: Insufficient documentation

## 2017-11-03 DIAGNOSIS — Z7982 Long term (current) use of aspirin: Secondary | ICD-10-CM | POA: Insufficient documentation

## 2017-11-03 DIAGNOSIS — I10 Essential (primary) hypertension: Secondary | ICD-10-CM | POA: Diagnosis not present

## 2017-11-03 DIAGNOSIS — R079 Chest pain, unspecified: Secondary | ICD-10-CM | POA: Diagnosis not present

## 2017-11-03 DIAGNOSIS — R0789 Other chest pain: Secondary | ICD-10-CM | POA: Diagnosis not present

## 2017-11-03 DIAGNOSIS — Z79899 Other long term (current) drug therapy: Secondary | ICD-10-CM | POA: Insufficient documentation

## 2017-11-03 DIAGNOSIS — R0602 Shortness of breath: Secondary | ICD-10-CM | POA: Diagnosis not present

## 2017-11-03 LAB — CBC
HEMATOCRIT: 34 % — AB (ref 36.0–46.0)
Hemoglobin: 10.6 g/dL — ABNORMAL LOW (ref 12.0–15.0)
MCH: 26.6 pg (ref 26.0–34.0)
MCHC: 31.2 g/dL (ref 30.0–36.0)
MCV: 85.4 fL (ref 80.0–100.0)
NRBC: 0 % (ref 0.0–0.2)
PLATELETS: 143 10*3/uL — AB (ref 150–400)
RBC: 3.98 MIL/uL (ref 3.87–5.11)
RDW: 15.9 % — ABNORMAL HIGH (ref 11.5–15.5)
WBC: 4.7 10*3/uL (ref 4.0–10.5)

## 2017-11-03 LAB — COMPREHENSIVE METABOLIC PANEL
ALT: 16 U/L (ref 0–44)
ANION GAP: 10 (ref 5–15)
AST: 23 U/L (ref 15–41)
Albumin: 3.8 g/dL (ref 3.5–5.0)
Alkaline Phosphatase: 44 U/L (ref 38–126)
BUN: 11 mg/dL (ref 8–23)
CHLORIDE: 101 mmol/L (ref 98–111)
CO2: 27 mmol/L (ref 22–32)
Calcium: 8.6 mg/dL — ABNORMAL LOW (ref 8.9–10.3)
Creatinine, Ser: 0.59 mg/dL (ref 0.44–1.00)
Glucose, Bld: 97 mg/dL (ref 70–99)
POTASSIUM: 4 mmol/L (ref 3.5–5.1)
Sodium: 138 mmol/L (ref 135–145)
Total Bilirubin: 1 mg/dL (ref 0.3–1.2)
Total Protein: 6.2 g/dL — ABNORMAL LOW (ref 6.5–8.1)

## 2017-11-03 LAB — TROPONIN I

## 2017-11-03 MED ORDER — CLONIDINE HCL 0.1 MG PO TABS
0.1000 mg | ORAL_TABLET | Freq: Once | ORAL | Status: AC
  Administered 2017-11-03: 0.1 mg via ORAL
  Filled 2017-11-03: qty 1

## 2017-11-03 MED ORDER — CLONIDINE HCL 0.1 MG PO TABS: 0.1000 mg | ORAL_TABLET | Freq: Two times a day (BID) | ORAL | 0 refills | Status: DC | PRN

## 2017-11-03 NOTE — Telephone Encounter (Signed)
Per chart review tab pt is at [facility] ED. 

## 2017-11-03 NOTE — Telephone Encounter (Signed)
Incoming call from Patient with a complaint of high blood pressure.  States it was 180/180 hr 59 later 200/97 heart rate 63.   Was taken on an automatic machine.  Patient does have a history of  High blood pressure.  States she has not missed any doses.  Denies blurred vision, difficulty breathing.  Does state that she has a headache.  Reviewed protocol with patient which recommend that she go to ED for further assessment. Reviewed care advice with patient  voiced understanding .  Patient states that she would drive herself.  Felt safe enough to drive her self to hospital.  States she would call her daughter if she became dizzy.     Reason for Disposition . [2] Systolic BP  >= 641 OR Diastolic >= 583 AND [0] cardiac or neurologic symptoms (e.g., chest pain, difficulty breathing, unsteady gait, blurred vision)  Answer Assessment - Initial Assessment Questions 1. BLOOD PRESSURE: "What is the blood pressure?" "Did you take at least two measurements 5 minutes apart?"     187/88 heart rate 59  2. ONSET: "When did you take your blood pressure?"     automatic 3. HOW: "How did you obtain the blood pressure?" (e.g., visiting nurse, automatic home BP monitor)     automatic 4. HISTORY: "Do you have a history of high blood pressure?"     yes 5. MEDICATIONS: "Are you taking any medications for blood pressure?" "Have you missed any doses recently?"      no 6. OTHER SYMPTOMS: "Do you have any symptoms?" (e.g., headache, chest pain, blurred vision, difficulty breathing, weakness)      headache 7. PREGNANCY: "Is there any chance you are pregnant?" "When was your last menstrual period?"     na  Protocols used: HIGH BLOOD PRESSURE-A-AH

## 2017-11-03 NOTE — ED Triage Notes (Signed)
States went to MD 4 days ago and noted blood pressure high. Saw cancer doctor Friday, went for bilateral US of legs due to bilateral leg edema and states this was negative. States has had chest pain and SOB today.

## 2017-11-03 NOTE — ED Provider Notes (Signed)
[facility] Emergency Department Provider Note  Time seen: 12:18 PM  I have reviewed the triage vital signs and the nursing notes.   HISTORY  Chief Complaint Hypertension    HPI Ashley Ryan is a 72 y.o. female with a past medical history of anemia, arthritis, gastric reflux, hypertension, presents to the emergency department for elevated blood pressure.  According to the patient for the past 4 days the patient's blood pressure has been elevated in the 180s to 200s.  Patient states today she is having some mild chest discomfort which she describes as a tightness sensation checked her blood pressure and had read over 200 patient became concerned so she came to the emergency department for evaluation.  Patient states mild intermittent shortness of breath.  Denies any nausea or diaphoresis.  Denies any chest pain currently.  Blood pressure currently 202/69 upon arrival.   Past Medical History:  Diagnosis Date  . Anemia   . Arthritis   . Chronic headache   . Complication of anesthesia    prior to 1991 used to have PONV.  none recently.  . Diverticulosis of colon   . Family history of adverse reaction to anesthesia    mother/daighter get sick  . GERD with stricture   . Hard of hearing   . Heart murmur    followed by PCP  . Hiatal hernia   . Hypertension   . Neuropathy    feet  . Osteoporosis   . Pre-diabetes   . Wears dentures    full upper    Patient Active Problem List   Diagnosis Date Noted  . B12 deficiency 07/24/2017  . Osteopenia of spine 07/03/2017  . Anemia 07/03/2017  . 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 Influenza 03/18/2017  . Foot pain 02/11/2017  . Right knee pain 12/23/2016  . Bilateral foot pain 05/21/2016  . Counseling regarding end of life decision making 11/10/2014  . Closed fracture of fourth metatarsal of right foot 11/01/2014  . 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 GERD (gastroesophageal reflux disease)  10/07/2013  . Stricture and stenosis of esophagus 10/07/2013  . Iron deficiency anemia 09/08/2013  . De Quervain's tenosynovitis, left 07/05/2011  . Diabetic peripheral neuropathy ([place]) 12/27/2009  . Osteoarthritis, bilateral knees 11/23/2009  . Controlled type 2 diabetes with neuropathy ([place]) 02/03/2009  . High cholesterol 02/03/2009  . COMMON MIGRAINE 11/03/2008  . ESSENTIAL HYPERTENSION, BENIGN 11/03/2008  . ALLERGIC RHINITIS DUE TO POLLEN 11/03/2008  . HIATAL HERNIA WITH REFLUX 11/03/2008  . DIVERTICULOSIS OF COLON 11/03/2008  . MURMUR 11/03/2008  . GASTRIC ULCER, HX OF 11/03/2008    Past Surgical History:  Procedure Laterality Date  . ABDOMINAL HYSTERECTOMY  1978   one ovary remains  . BREAST BIOPSY Left 05/26/2017   Affirm Bx- path pending - coil clip  . CARDIAC CATHETERIZATION     "yrs ago" all OK.  Marland Kitchen CATARACT EXTRACTION W/PHACO Left 02/21/2016   Procedure: CATARACT EXTRACTION PHACO AND INTRAOCULAR LENS PLACEMENT ([place])  Left eye;  Surgeon: Leandrew Koyanagi, MD;  Location: [facility];  Service: Ophthalmology;  Laterality: Left;  Left eye Diabetic  . CATARACT EXTRACTION W/PHACO Right 03/13/2016   Procedure: CATARACT EXTRACTION PHACO AND INTRAOCULAR LENS PLACEMENT ([place])  right  diabetic;  Surgeon: Leandrew Koyanagi, MD;  Location: [facility];  Service: Ophthalmology;  Laterality: Right;  diabetic  . CHOLECYSTECTOMY  1978  . OOPHORECTOMY     one still left  . SENTINEL NODE  BIOPSY Left 06/13/2017   Procedure: SENTINEL NODE BIOPSY;  Surgeon: Robert Bellow, MD;  Location: [facility];  Service: General;  Laterality: Left;  . 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  Surgeon: Robert Bellow, MD;  Location: [facility];  Service: General;  Laterality: Left;    Prior to Admission medications   Medication Sig Start Date End Date Taking? Authorizing Provider  ACCU-CHEK SOFTCLIX LANCETS  lancets Use to check blood sugar once daily 02/09/14  Yes Bedsole, Amy E, MD  Alcohol Swabs (B-D SINGLE USE SWABS REGULAR) PADS Use to check blood sugar once daily 02/09/14  Yes Bedsole, Amy E, MD  amLODipine (NORVASC) 5 MG tablet Take 1 tablet (5 mg total) by mouth daily. 11/21/15  Yes Bedsole, Amy E, MD  aspirin EC 81 MG tablet Take 81 mg by mouth daily.  07/22/02  Yes [provider]  atenolol (TENORMIN) 100 MG tablet TAKE 1 TABLET EVERY DAY Patient taking differently: Take 100 mg by mouth daily.  09/09/17  Yes Bedsole, Amy E, MD  atorvastatin (LIPITOR) 40 MG tablet TAKE 1 TABLET EVERY DAY at bedtime Patient taking differently: Take 40 mg by mouth at bedtime.  10/29/17  Yes Bedsole, Amy E, MD  Blood Glucose Calibration (ACCU-CHEK AVIVA) SOLN Use to check blood sugar once daily 02/09/14  Yes Bedsole, Amy E, MD  Blood Glucose Monitoring Suppl (ACCU-CHEK AVIVA PLUS) W/DEVICE KIT Use to check blood sugar once daily 02/09/14  Yes Bedsole, Amy E, MD  Calcium Carbonate-Vitamin D (CALCIUM 600+D) 600-400 MG-UNIT per tablet Take 1 tablet by mouth 2 (two) times daily.    Yes [provider]  esomeprazole (NEXIUM) 40 MG capsule TAKE 1 CAPSULE 2 (TWO) TIMES DAILY BEFORE A MEAL. Patient taking differently: Take 40 mg by mouth 2 (two) times daily before a meal.  03/20/17  Yes Bedsole, Amy E, MD  FLUoxetine (PROZAC) 20 MG capsule TAKE 1 CAPSULE EVERY DAY Patient taking differently: Take 20 mg by mouth daily.  05/12/17  Yes Bedsole, Amy E, MD  fluticasone (FLONASE) 50 MCG/ACT nasal spray Place 2 sprays into both nostrils daily as needed for allergies or rhinitis.   Yes [provider]  furosemide (LASIX) 20 MG tablet Take 1 tablet (20 mg total) by mouth daily. Prn lower extremity edema Patient taking differently: Take 20 mg by mouth daily as needed for fluid or edema.  10/30/17  Yes Copland, Frederico Hamman, MD  gabapentin (NEURONTIN) 100 MG capsule TAKE 1 CAPSULE IN THE MORNING, 1 CAPSULE AT LUNCH, 1  CAPSULES AT DINNER AND 2 TO 3 CAPSULES AT BEDTIME Patient taking differently: Take 100-300 mg by mouth See admin instructions. 100 mg daily with breakfast, 100 mg daily with dinner, and 300 mg daily at bedtime 03/19/17  Yes Bedsole, Amy E, MD  Glucosamine 500 MG TABS Take 500 mg by mouth daily.    Yes [provider]  glucose blood (ACCU-CHEK AVIVA) test strip Use to check blood sugar once daily 02/09/14  Yes Bedsole, Amy E, MD  latanoprost (XALATAN) 0.005 % ophthalmic solution Place 1 drop into both eyes at bedtime.   Yes [provider]  lidocaine (LIDODERM) 5 % APPLY 1 PATCH ONTO THE SKIN EVERY DAY. REMOVE AND DISCARD PATCH WITHIN 12 HOURS OR AS DIRECTED BY PHYSICIAN. Patient taking differently: Place 1 patch onto the skin daily as needed (for pain).  08/14/17  Yes Bedsole, Amy E, MD  loratadine (CLARITIN)  10 MG tablet Take 10 mg by mouth daily.   Yes [provider]  losartan-hydrochlorothiazide (HYZAAR) 100-25 MG tablet TAKE 1 TABLET EVERY DAY Patient taking differently: Take 1 tablet by mouth daily.  03/20/17  Yes Bedsole, Amy E, MD  Omega-3 Fatty Acids (FISH OIL) 1000 MG CAPS Take 1,000 mg by mouth daily.    Yes [provider]  phenylephrine (SUDAFED PE) 10 MG TABS tablet Take 10 mg by mouth every 4 (four) hours as needed (for nasal congestion).    Yes [provider]  polycarbophil (FIBERCON) 625 MG tablet Take 625 mg by mouth daily.   Yes [provider]  tamoxifen (NOLVADEX) 20 MG tablet Take 1 tablet (20 mg total) by mouth daily. 08/29/17  Yes Karen Kitchens, NP  traMADol (ULTRAM) 50 MG tablet Take 1 tablet (50 mg total) by mouth every 6 (six) hours as needed. for pain Patient taking differently: Take 50 mg by mouth every 6 (six) hours as needed for moderate pain or severe pain.  08/08/17  Yes Bedsole, Amy E, MD  vitamin B-12 (CYANOCOBALAMIN) 1000 MCG tablet Take 1 tablet (1,000 mcg total) by mouth daily. 07/04/17  Yes Karen Kitchens, NP     Allergies  Allergen Reactions  . Sulfa Antibiotics Rash    Childhood reaction  . Sulfonamide Derivatives Rash    Childhood reaction.    Family History  Problem Relation Age of Onset  . Breast cancer Unknown   . Stomach cancer Unknown   . Diabetes Unknown   . Heart disease Unknown   . Breast cancer Sister 20  . Esophageal cancer Brother     Social History Social History   Tobacco Use  . Smoking status: Never Smoker  . Smokeless tobacco: Never Used  Substance Use Topics  . Alcohol use: No  . Drug use: No    Review of Systems Constitutional: Negative for fever. Cardiovascular: Mild chest tightness Respiratory: Negative for shortness of breath. Gastrointestinal: Negative for abdominal pain, vomiting Musculoskeletal: Negative for musculoskeletal complaints Skin: Negative for skin complaints  Neurological: Negative for headache All other ROS negative  ____________________________________________   PHYSICAL EXAM:  VITAL SIGNS: ED Triage Vitals  Enc Vitals Group     BP 11/03/17 0936 (!) 202/69     Pulse Rate 11/03/17 0936 60     Resp 11/03/17 0936 18     Temp 11/03/17 0936 98.5 F (36.9 C)     Temp Source 11/03/17 0936 Oral     SpO2 11/03/17 0936 96 %     Weight --      Height --      Head Circumference --      Peak Flow --      Pain Score 11/03/17 0939 3     Pain Loc --      Pain Edu? --      Excl. in [place]? --    Constitutional: Alert and oriented. Well appearing and in no distress. Eyes: Normal exam ENT   Head: Normocephalic and atraumatic   Mouth/Throat: Mucous membranes are moist. Cardiovascular: Normal rate, regular rhythm. No murmur Respiratory: Normal respiratory effort without tachypnea nor retractions. Breath sounds are clear  Gastrointestinal: Soft and nontender. No distention.   Musculoskeletal: Nontender with normal range of motion in all extremities.  Neurologic:  Normal speech and language. No gross focal neurologic deficits   Skin:  Skin is warm, dry and intact.  Psychiatric: Mood and affect are normal.   ____________________________________________  EKG  EKG reviewed and interpreted by myself shows a normal sinus rhythm at 58 bpm with a narrow QRS, normal axis, normal intervals, no concerning ST changes.  Overall reassuring EKG.  ____________________________________________    RADIOLOGY  X-ray shows mild vascular congestion.  No edema.  ____________________________________________   INITIAL IMPRESSION / ASSESSMENT AND PLAN / ED COURSE  Pertinent labs & imaging results that were available during my care of the patient were reviewed by me and considered in my medical decision making (see chart for details).  Patient presents to the emergency department with elevated blood pressure and mild chest discomfort.  Patient states when her blood pressure went over 200 she became concerned enough to come to the emergency department for evaluation.  Denies any chest discomfort currently.  Blood pressure is 202/69 initially upon evaluation.  Differential would include essential hypertension, secondary hypertension, ACS, CHF due to afterload.  We will check labs including cardiac enzymes, chest x-ray and continue to closely monitor.  Patient's labs are resulted largely at baseline/normal.  Troponin is negative.  Chest x-ray is reassuring.  Patient's blood pressure remains elevated in 190s.  States she took all of her morning medications as prescribed.  We will dose a one-time dose of 0.1 mg of clonidine.  Overall patient states she feels really well.  I offered to continue watching the patient in the emergency department to ensure that the blood pressure begins to come down.  Patient states she would rather rest at home and follow-up with her doctor this week which I believe is reasonable.  Provided my normal return precautions for any chest pain or trouble breathing.  Patient agreeable to plan of care.  We will  discharge with a PRN clonidine to use if blood pressures greater than 180.  Patient states she will follow-up with her doctor this week.  ____________________________________________   FINAL CLINICAL IMPRESSION(S) / ED DIAGNOSES  Hypertension Chest pain    Harvest Dark, MD 11/03/17 1241

## 2017-11-04 ENCOUNTER — Ambulatory Visit: Payer: Medicare HMO | Admitting: General Surgery

## 2017-11-04 ENCOUNTER — Encounter: Payer: Self-pay | Admitting: Family Medicine

## 2017-11-04 ENCOUNTER — Ambulatory Visit (INDEPENDENT_AMBULATORY_CARE_PROVIDER_SITE_OTHER): Payer: Medicare HMO | Admitting: Family Medicine

## 2017-11-04 VITALS — BP 134/59 | HR 56 | Temp 97.9°F | Ht 61.0 in | Wt 159.5 lb

## 2017-11-04 DIAGNOSIS — R079 Chest pain, unspecified: Secondary | ICD-10-CM | POA: Diagnosis not present

## 2017-11-04 DIAGNOSIS — I209 Angina pectoris, unspecified: Secondary | ICD-10-CM | POA: Insufficient documentation

## 2017-11-04 DIAGNOSIS — I10 Essential (primary) hypertension: Secondary | ICD-10-CM

## 2017-11-04 NOTE — Progress Notes (Signed)
New Outpatient Visit Date: 11/05/2017  Referring Provider: [name], MD [place], [place] [address]  Chief Complaint: Chest pain  HPI:  Ms. Deakins is a 72 y.o. female who is being seen today for the evaluation of chest pain and elevated blood pressure at the request of Bedsole, Amy E, MD. She has a history of hypertension, hiatal hernia, GERD, prediabetes, anemia, and arthritis.  [name] presented to the [facility] emergency department 2 days ago with elevated blood pressure.  She noted systolic readings in the 177-939 range for the preceding 4 days.  She also noted mild chest tightness during this time.  She was given a one-time dose of clonidine and discharged home with instructions to follow-up with her PCP.  Yesterday, she reported continued labile blood pressures as well as intermittent chest pain.  She therefore presents today for urgent evaluation.  [name] notes that over the last week, she has experienced occasional tightness in the center of her chest without radiation.  It is accompanied with shortness of breath.  At its worst, the pain has been 3/10.  She is currently having 2/10 discomfort in the office.  The discomfort seems to worsen when she walks or exerts herself.  She has been unsure if the pain is related to her heart or to her breast surgery this summer.  [name] reports having undergone several cardiac evaluations in the past, including a cardiac catheterization about 5 to 10 years ago.  She does not believe any significant disease was identified, though records are not immediately available.  [name] has also noted some intermittent leg swelling and was recently placed on furosemide 20 mg daily as needed.  She notes that her leg swelling has improved.  The patient denies orthopnea and PND as well as palpitations and lightheadedness.  She consumes about 1 can of Coca-Cola per day.  She has chronic anemia for which she is now receiving B12 injections.   She denies any bleeding, including melena and hematochezia.  She has long-standing hypertension and has been on multiple agents over the last few years.  She notes that her blood pressure has been particularly labile over the last few weeks.  She took as needed clonidine last night and this morning.  --------------------------------------------------------------------------------------------------  Cardiovascular History & Procedures: Cardiovascular Problems:  Chest pain  Risk Factors:  Hypertension, obesity, and age greater than 48  Cath/PCI:  None  CV Surgery:  None  EP Procedures and Devices:  None  Non-Invasive Evaluation(s):  Bilateral lower extremity venous duplex (10/31/2017): No evidence of lower extremity DVT.  Recent CV Pertinent Labs: Lab Results  Component Value Date   CHOL 129 11/12/2016   HDL 47.30 11/12/2016   LDLCALC 62 11/12/2016   LDLDIRECT 102.5 07/21/2009   TRIG 101.0 11/12/2016   CHOLHDL 3 11/12/2016   INR 0.9 11/11/2013   K 4.0 11/03/2017   K 3.9 11/25/2013   BUN 11 11/03/2017   BUN 16 11/25/2013   CREATININE 0.59 11/03/2017   CREATININE 0.67 11/25/2013    --------------------------------------------------------------------------------------------------  Past Medical History:  Diagnosis Date  . Anemia   . Arthritis   . Chronic headache   . Complication of anesthesia    prior to 1991 used to have PONV.  none recently.  . Diverticulosis of colon   . Family history of adverse reaction to anesthesia    mother/daighter get sick  . GERD with stricture   . Hard of hearing   . Heart  murmur    followed by PCP  . Hiatal hernia   . Hypertension   . Neuropathy    feet  . Osteoporosis   . Pre-diabetes   . Wears dentures    full upper    Past Surgical History:  Procedure Laterality Date  . ABDOMINAL HYSTERECTOMY  1978   one ovary remains  . BREAST BIOPSY Left 05/26/2017   Affirm Bx- path pending - coil clip  . CARDIAC  CATHETERIZATION     "yrs ago" all OK.  Marland Kitchen CATARACT EXTRACTION W/PHACO Left 02/21/2016   Procedure: CATARACT EXTRACTION PHACO AND INTRAOCULAR LENS PLACEMENT ([place])  Left eye;  Surgeon: Leandrew Koyanagi, MD;  Location: [facility];  Service: Ophthalmology;  Laterality: Left;  Left eye Diabetic  . CATARACT EXTRACTION W/PHACO Right 03/13/2016   Procedure: CATARACT EXTRACTION PHACO AND INTRAOCULAR LENS PLACEMENT ([place])  right  diabetic;  Surgeon: Leandrew Koyanagi, MD;  Location: [facility];  Service: Ophthalmology;  Laterality: Right;  diabetic  . CHOLECYSTECTOMY  1978  . OOPHORECTOMY     one still left  . SENTINEL NODE BIOPSY Left 06/13/2017   Procedure: SENTINEL NODE BIOPSY;  Surgeon: Robert Bellow, MD;  Location: [facility];  Service: General;  Laterality: Left;  . 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  Surgeon: Robert Bellow, MD;  Location: [facility];  Service: General;  Laterality: Left;    Current Meds  Medication Sig  . ACCU-CHEK SOFTCLIX LANCETS lancets Use to check blood sugar once daily  . Alcohol Swabs (B-D SINGLE USE SWABS REGULAR) PADS Use to check blood sugar once daily  . amLODipine (NORVASC) 5 MG tablet Take 1 tablet (5 mg total) by mouth daily.  Marland Kitchen aspirin EC 81 MG tablet Take 81 mg by mouth daily.   Marland Kitchen atenolol (TENORMIN) 100 MG tablet TAKE 1 TABLET EVERY DAY (Patient taking differently: Take 100 mg by mouth daily. )  . atorvastatin (LIPITOR) 40 MG tablet TAKE 1 TABLET EVERY DAY at bedtime (Patient taking differently: Take 40 mg by mouth at bedtime. )  . Blood Glucose Calibration (ACCU-CHEK AVIVA) SOLN Use to check blood sugar once daily  . Blood Glucose Monitoring Suppl (ACCU-CHEK AVIVA PLUS) W/DEVICE KIT Use to check blood sugar once daily  . Calcium Carbonate-Vitamin D (CALCIUM 600+D) 600-400 MG-UNIT per tablet Take 1 tablet by mouth 2 (two) times daily.   . cloNIDine  (CATAPRES) 0.1 MG tablet Take 1 tablet (0.1 mg total) by mouth 2 (two) times daily as needed (Systolic blood pressure greater than 180).  Marland Kitchen esomeprazole (NEXIUM) 40 MG capsule TAKE 1 CAPSULE 2 (TWO) TIMES DAILY BEFORE A MEAL. (Patient taking differently: Take 40 mg by mouth 2 (two) times daily before a meal. )  . FLUoxetine (PROZAC) 20 MG capsule TAKE 1 CAPSULE EVERY DAY (Patient taking differently: Take 20 mg by mouth daily. )  . fluticasone (FLONASE) 50 MCG/ACT nasal spray Place 2 sprays into both nostrils daily as needed for allergies or rhinitis.  . furosemide (LASIX) 20 MG tablet Take 1 tablet (20 mg total) by mouth daily. Prn lower extremity edema (Patient taking differently: Take 20 mg by mouth daily as needed for fluid or edema. )  . gabapentin (NEURONTIN) 100 MG capsule TAKE 1 CAPSULE IN THE MORNING, 1 CAPSULE AT LUNCH, 1 CAPSULES AT DINNER AND 2 TO 3 CAPSULES AT BEDTIME (Patient taking differently: Take 100-300 mg by mouth See admin instructions. 100  mg daily with breakfast, 100 mg daily with dinner, and 300 mg daily at bedtime)  . Glucosamine 500 MG TABS Take 500 mg by mouth daily.   Marland Kitchen glucose blood (ACCU-CHEK AVIVA) test strip Use to check blood sugar once daily  . latanoprost (XALATAN) 0.005 % ophthalmic solution Place 1 drop into both eyes at bedtime.  . lidocaine (LIDODERM) 5 % APPLY 1 PATCH ONTO THE SKIN EVERY DAY. REMOVE AND DISCARD PATCH WITHIN 12 HOURS OR AS DIRECTED BY PHYSICIAN. (Patient taking differently: Place 1 patch onto the skin daily as needed (for pain). )  . loratadine (CLARITIN) 10 MG tablet Take 10 mg by mouth daily.  Marland Kitchen losartan-hydrochlorothiazide (HYZAAR) 100-25 MG tablet TAKE 1 TABLET EVERY DAY (Patient taking differently: Take 1 tablet by mouth daily. )  . Omega-3 Fatty Acids (FISH OIL) 1000 MG CAPS Take 1,000 mg by mouth daily.   . phenylephrine (SUDAFED PE) 10 MG TABS tablet Take 10 mg by mouth every 4 (four) hours as needed (for nasal congestion).   .  polycarbophil (FIBERCON) 625 MG tablet Take 625 mg by mouth daily.  . tamoxifen (NOLVADEX) 20 MG tablet Take 1 tablet (20 mg total) by mouth daily.  . traMADol (ULTRAM) 50 MG tablet Take 1 tablet (50 mg total) by mouth every 6 (six) hours as needed. for pain (Patient taking differently: Take 50 mg by mouth every 6 (six) hours as needed for moderate pain or severe pain. )    Allergies: Sulfa antibiotics and Sulfonamide derivatives  Social History   Tobacco Use  . Smoking status: Never Smoker  . Smokeless tobacco: Never Used  Substance Use Topics  . Alcohol use: No  . Drug use: No    Family History  Problem Relation Age of Onset  . Breast cancer Unknown   . Stomach cancer Unknown   . Diabetes Unknown   . Heart disease Unknown   . Breast cancer Sister 68  . Esophageal cancer Brother   . Hypertension Mother   . Heart attack Mother 21  . Hypertension Father   . Coronary artery disease Father 65       CABG  . Stroke Father     Review of Systems: A 12-system review of systems was performed and was negative except as noted in the HPI.  --------------------------------------------------------------------------------------------------  Physical Exam: BP 138/70 (BP Location: Right Arm, Patient Position: Sitting, Cuff Size: Normal)   Pulse (!) 54   Resp (!) 97   Ht 5' (1.524 m)   Wt 156 lb 4 oz (70.9 kg)   BMI 30.52 kg/m   General: NAD. HEENT: No conjunctival pallor or scleral icterus. Moist mucous membranes. OP clear. Neck: Supple without lymphadenopathy, thyromegaly, JVD, or HJR. No carotid bruit. Lungs: Normal work of breathing. Clear to auscultation bilaterally without wheezes or crackles. Heart: We will rate and rhythm with 2/6 systolic murmur loudest at the left lower sternal border.  No rubs or gallops.  Nondisplaced PMI. Abd: Bowel sounds present. Soft, NT/ND without hepatosplenomegaly Ext: No lower extremity edema. Radial, PT, and DP pulses are 2+ bilaterally Skin:  Warm and dry without rash. Neuro: CNIII-XII intact. Strength and fine-touch sensation intact in upper and lower extremities bilaterally. Psych: Normal mood and affect.  EKG: Sinus bradycardia (heart rate 54 bpm) otherwise, no significant abnormalities.  Lab Results  Component Value Date   WBC 4.7 11/03/2017   HGB 10.6 (L) 11/03/2017   HCT 34.0 (L) 11/03/2017   MCV 85.4 11/03/2017   PLT 143 (L)  11/03/2017    Lab Results  Component Value Date   NA 138 11/03/2017   K 4.0 11/03/2017   CL 101 11/03/2017   CO2 27 11/03/2017   BUN 11 11/03/2017   CREATININE 0.59 11/03/2017   GLUCOSE 97 11/03/2017   ALT 16 11/03/2017    Lab Results  Component Value Date   CHOL 129 11/12/2016   HDL 47.30 11/12/2016   LDLCALC 62 11/12/2016   LDLDIRECT 102.5 07/21/2009   TRIG 101.0 11/12/2016   CHOLHDL 3 11/12/2016     --------------------------------------------------------------------------------------------------  ASSESSMENT AND PLAN: Unstable angina I am concerned that escalating chest pain, now present at rest, represents unstable angina.  The patient has several cardiac risk factors including uncontrolled hypertension and age.  Additionally, her parents both had coronary artery disease in their 47s.  Recent ED evaluation was unrevealing.  However, given further escalation of pain, I have recommended that Ms. Kendrix be admitted for medical optimization and left heart catheterization tomorrow.  I have reviewed the risks, indications, and alternatives to cardiac catheterization, possible angioplasty, and stenting with the patient. Risks include but are not limited to bleeding, infection, vascular injury, stroke, myocardial infection, arrhythmia, kidney injury, radiation-related injury in the case of prolonged fluoroscopy use, emergency cardiac surgery, and death. The patient understands the risks of serious complication is 1-2 in 6283 with diagnostic cardiac cath and 1-2% or less with  angioplasty/stenting.  She should be n.p.o. after midnight in anticipation of this.  Addition of nitroglycerin paste or oral long-acting nitrate is recommended.  We will troponin should also be checked; if they become positive, heparin infusion should be initiated.  Shortness of breath Likely multifactorial.  Certainly CAD could be contributing.  Systolic murmur also appreciated on exam.  We will proceed with catheterization, as above.  I also recommend obtaining a transthoracic echocardiogram.  Labile hypertension Blood pressure is upper normal today but has been quite labile despite being on 4 agents.  Ms. Sundquist reports being compliant.  I feel that secondary hypertension work-up would be reasonable.  However, I will defer this until further evaluating her unstable angina.  We will consider performing renal angiography at the time of her catheterization tomorrow to exclude significant renal artery stenosis.  Follow-up: To be determined based on hospital course.  Nelva Bush, MD 11/05/2017 4:17 PM

## 2017-11-04 NOTE — H&P (View-Only) (Signed)
New Outpatient Visit Date: 11/05/2017  Referring Provider: [name], MD [place], [place] [address]  Chief Complaint: Chest pain  HPI:  [name] is a 72 y.o. female who is being seen today for the evaluation of chest pain and elevated blood pressure at the request of Bedsole, Amy E, MD. She has a history of hypertension, hiatal hernia, GERD, prediabetes, anemia, and arthritis.  [name] presented to the [facility] emergency department 2 days ago with elevated blood pressure.  She noted systolic readings in the 177-939 range for the preceding 4 days.  She also noted mild chest tightness during this time.  She was given a one-time dose of clonidine and discharged home with instructions to follow-up with her PCP.  Yesterday, she reported continued labile blood pressures as well as intermittent chest pain.  She therefore presents today for urgent evaluation.  [name] notes that over the last week, she has experienced occasional tightness in the center of her chest without radiation.  It is accompanied with shortness of breath.  At its worst, the pain has been 3/10.  She is currently having 2/10 discomfort in the office.  The discomfort seems to worsen when she walks or exerts herself.  She has been unsure if the pain is related to her heart or to her breast surgery this summer.  [name] reports having undergone several cardiac evaluations in the past, including a cardiac catheterization about 5 to 10 years ago.  She does not believe any significant disease was identified, though records are not immediately available.  [name] has also noted some intermittent leg swelling and was recently placed on furosemide 20 mg daily as needed.  She notes that her leg swelling has improved.  The patient denies orthopnea and PND as well as palpitations and lightheadedness.  She consumes about 1 can of Coca-Cola per day.  She has chronic anemia for which she is now receiving B12 injections.   She denies any bleeding, including melena and hematochezia.  She has long-standing hypertension and has been on multiple agents over the last few years.  She notes that her blood pressure has been particularly labile over the last few weeks.  She took as needed clonidine last night and this morning.  --------------------------------------------------------------------------------------------------  Cardiovascular History & Procedures: Cardiovascular Problems:  Chest pain  Risk Factors:  Hypertension, obesity, and age greater than 48  Cath/PCI:  None  CV Surgery:  None  EP Procedures and Devices:  None  Non-Invasive Evaluation(s):  Bilateral lower extremity venous duplex (10/31/2017): No evidence of lower extremity DVT.  Recent CV Pertinent Labs: Lab Results  Component Value Date   CHOL 129 11/12/2016   HDL 47.30 11/12/2016   LDLCALC 62 11/12/2016   LDLDIRECT 102.5 07/21/2009   TRIG 101.0 11/12/2016   CHOLHDL 3 11/12/2016   INR 0.9 11/11/2013   K 4.0 11/03/2017   K 3.9 11/25/2013   BUN 11 11/03/2017   BUN 16 11/25/2013   CREATININE 0.59 11/03/2017   CREATININE 0.67 11/25/2013    --------------------------------------------------------------------------------------------------  Past Medical History:  Diagnosis Date  . Anemia   . Arthritis   . Chronic headache   . Complication of anesthesia    prior to 1991 used to have PONV.  none recently.  . Diverticulosis of colon   . Family history of adverse reaction to anesthesia    mother/daighter get sick  . GERD with stricture   . Hard of hearing   . Heart  murmur    followed by PCP  . Hiatal hernia   . Hypertension   . Neuropathy    feet  . Osteoporosis   . Pre-diabetes   . Wears dentures    full upper    Past Surgical History:  Procedure Laterality Date  . ABDOMINAL HYSTERECTOMY  1978   one ovary remains  . BREAST BIOPSY Left 05/26/2017   Affirm Bx- path pending - coil clip  . CARDIAC  CATHETERIZATION     "yrs ago" all OK.  . CATARACT EXTRACTION W/PHACO Left 02/21/2016   Procedure: CATARACT EXTRACTION PHACO AND INTRAOCULAR LENS PLACEMENT (IOC)  Left eye;  Surgeon: Chadwick Brasington, MD;  Location: [facility];  Service: Ophthalmology;  Laterality: Left;  Left eye Diabetic  . CATARACT EXTRACTION W/PHACO Right 03/13/2016   Procedure: CATARACT EXTRACTION PHACO AND INTRAOCULAR LENS PLACEMENT (IOC)  right  diabetic;  Surgeon: Chadwick Brasington, MD;  Location: [facility];  Service: Ophthalmology;  Laterality: Right;  diabetic  . CHOLECYSTECTOMY  1978  . OOPHORECTOMY     one still left  . SENTINEL NODE BIOPSY Left 06/13/2017   Procedure: SENTINEL NODE BIOPSY;  Surgeon: Byrnett, Jeffrey W, MD;  Location: [facility];  Service: General;  Laterality: Left;  . 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  Surgeon: Byrnett, Jeffrey W, MD;  Location: [facility];  Service: General;  Laterality: Left;    Current Meds  Medication Sig  . ACCU-CHEK SOFTCLIX LANCETS lancets Use to check blood sugar once daily  . Alcohol Swabs (B-D SINGLE USE SWABS REGULAR) PADS Use to check blood sugar once daily  . amLODipine (NORVASC) 5 MG tablet Take 1 tablet (5 mg total) by mouth daily.  . aspirin EC 81 MG tablet Take 81 mg by mouth daily.   . atenolol (TENORMIN) 100 MG tablet TAKE 1 TABLET EVERY DAY (Patient taking differently: Take 100 mg by mouth daily. )  . atorvastatin (LIPITOR) 40 MG tablet TAKE 1 TABLET EVERY DAY at bedtime (Patient taking differently: Take 40 mg by mouth at bedtime. )  . Blood Glucose Calibration (ACCU-CHEK AVIVA) SOLN Use to check blood sugar once daily  . Blood Glucose Monitoring Suppl (ACCU-CHEK AVIVA PLUS) W/DEVICE KIT Use to check blood sugar once daily  . Calcium Carbonate-Vitamin D (CALCIUM 600+D) 600-400 MG-UNIT per tablet Take 1 tablet by mouth 2 (two) times daily.   . cloNIDine  (CATAPRES) 0.1 MG tablet Take 1 tablet (0.1 mg total) by mouth 2 (two) times daily as needed (Systolic blood pressure greater than 180).  . esomeprazole (NEXIUM) 40 MG capsule TAKE 1 CAPSULE 2 (TWO) TIMES DAILY BEFORE A MEAL. (Patient taking differently: Take 40 mg by mouth 2 (two) times daily before a meal. )  . FLUoxetine (PROZAC) 20 MG capsule TAKE 1 CAPSULE EVERY DAY (Patient taking differently: Take 20 mg by mouth daily. )  . fluticasone (FLONASE) 50 MCG/ACT nasal spray Place 2 sprays into both nostrils daily as needed for allergies or rhinitis.  . furosemide (LASIX) 20 MG tablet Take 1 tablet (20 mg total) by mouth daily. Prn lower extremity edema (Patient taking differently: Take 20 mg by mouth daily as needed for fluid or edema. )  . gabapentin (NEURONTIN) 100 MG capsule TAKE 1 CAPSULE IN THE MORNING, 1 CAPSULE AT LUNCH, 1 CAPSULES AT DINNER AND 2 TO 3 CAPSULES AT BEDTIME (Patient taking differently: Take 100-300 mg by mouth See admin instructions. 100   mg daily with breakfast, 100 mg daily with dinner, and 300 mg daily at bedtime)  . Glucosamine 500 MG TABS Take 500 mg by mouth daily.   . glucose blood (ACCU-CHEK AVIVA) test strip Use to check blood sugar once daily  . latanoprost (XALATAN) 0.005 % ophthalmic solution Place 1 drop into both eyes at bedtime.  . lidocaine (LIDODERM) 5 % APPLY 1 PATCH ONTO THE SKIN EVERY DAY. REMOVE AND DISCARD PATCH WITHIN 12 HOURS OR AS DIRECTED BY PHYSICIAN. (Patient taking differently: Place 1 patch onto the skin daily as needed (for pain). )  . loratadine (CLARITIN) 10 MG tablet Take 10 mg by mouth daily.  . losartan-hydrochlorothiazide (HYZAAR) 100-25 MG tablet TAKE 1 TABLET EVERY DAY (Patient taking differently: Take 1 tablet by mouth daily. )  . Omega-3 Fatty Acids (FISH OIL) 1000 MG CAPS Take 1,000 mg by mouth daily.   . phenylephrine (SUDAFED PE) 10 MG TABS tablet Take 10 mg by mouth every 4 (four) hours as needed (for nasal congestion).   .  polycarbophil (FIBERCON) 625 MG tablet Take 625 mg by mouth daily.  . tamoxifen (NOLVADEX) 20 MG tablet Take 1 tablet (20 mg total) by mouth daily.  . traMADol (ULTRAM) 50 MG tablet Take 1 tablet (50 mg total) by mouth every 6 (six) hours as needed. for pain (Patient taking differently: Take 50 mg by mouth every 6 (six) hours as needed for moderate pain or severe pain. )    Allergies: Sulfa antibiotics and Sulfonamide derivatives  Social History   Tobacco Use  . Smoking status: Never Smoker  . Smokeless tobacco: Never Used  Substance Use Topics  . Alcohol use: No  . Drug use: No    Family History  Problem Relation Age of Onset  . Breast cancer Unknown   . Stomach cancer Unknown   . Diabetes Unknown   . Heart disease Unknown   . Breast cancer Sister 50  . Esophageal cancer Brother   . Hypertension Mother   . Heart attack Mother 71  . Hypertension Father   . Coronary artery disease Father 70       CABG  . Stroke Father     Review of Systems: A 12-system review of systems was performed and was negative except as noted in the HPI.  --------------------------------------------------------------------------------------------------  Physical Exam: BP 138/70 (BP Location: Right Arm, Patient Position: Sitting, Cuff Size: Normal)   Pulse (!) 54   Resp (!) 97   Ht 5' (1.524 m)   Wt 156 lb 4 oz (70.9 kg)   BMI 30.52 kg/m   General: NAD. HEENT: No conjunctival pallor or scleral icterus. Moist mucous membranes. OP clear. Neck: Supple without lymphadenopathy, thyromegaly, JVD, or HJR. No carotid bruit. Lungs: Normal work of breathing. Clear to auscultation bilaterally without wheezes or crackles. Heart: We will rate and rhythm with 2/6 systolic murmur loudest at the left lower sternal border.  No rubs or gallops.  Nondisplaced PMI. Abd: Bowel sounds present. Soft, NT/ND without hepatosplenomegaly Ext: No lower extremity edema. Radial, PT, and DP pulses are 2+ bilaterally Skin:  Warm and dry without rash. Neuro: CNIII-XII intact. Strength and fine-touch sensation intact in upper and lower extremities bilaterally. Psych: Normal mood and affect.  EKG: Sinus bradycardia (heart rate 54 bpm) otherwise, no significant abnormalities.  Lab Results  Component Value Date   WBC 4.7 11/03/2017   HGB 10.6 (L) 11/03/2017   HCT 34.0 (L) 11/03/2017   MCV 85.4 11/03/2017   PLT 143 (L)   11/03/2017    Lab Results  Component Value Date   NA 138 11/03/2017   K 4.0 11/03/2017   CL 101 11/03/2017   CO2 27 11/03/2017   BUN 11 11/03/2017   CREATININE 0.59 11/03/2017   GLUCOSE 97 11/03/2017   ALT 16 11/03/2017    Lab Results  Component Value Date   CHOL 129 11/12/2016   HDL 47.30 11/12/2016   LDLCALC 62 11/12/2016   LDLDIRECT 102.5 07/21/2009   TRIG 101.0 11/12/2016   CHOLHDL 3 11/12/2016     --------------------------------------------------------------------------------------------------  ASSESSMENT AND PLAN: Unstable angina I am concerned that escalating chest pain, now present at rest, represents unstable angina.  The patient has several cardiac risk factors including uncontrolled hypertension and age.  Additionally, her parents both had coronary artery disease in their 70s.  Recent ED evaluation was unrevealing.  However, given further escalation of pain, I have recommended that Ms. Cathell be admitted for medical optimization and left heart catheterization tomorrow.  I have reviewed the risks, indications, and alternatives to cardiac catheterization, possible angioplasty, and stenting with the patient. Risks include but are not limited to bleeding, infection, vascular injury, stroke, myocardial infection, arrhythmia, kidney injury, radiation-related injury in the case of prolonged fluoroscopy use, emergency cardiac surgery, and death. The patient understands the risks of serious complication is 1-2 in 1000 with diagnostic cardiac cath and 1-2% or less with  angioplasty/stenting.  She should be n.p.o. after midnight in anticipation of this.  Addition of nitroglycerin paste or oral long-acting nitrate is recommended.  We will troponin should also be checked; if they become positive, heparin infusion should be initiated.  Shortness of breath Likely multifactorial.  Certainly CAD could be contributing.  Systolic murmur also appreciated on exam.  We will proceed with catheterization, as above.  I also recommend obtaining a transthoracic echocardiogram.  Labile hypertension Blood pressure is upper normal today but has been quite labile despite being on 4 agents.  Ms. Bergman reports being compliant.  I feel that secondary hypertension work-up would be reasonable.  However, I will defer this until further evaluating her unstable angina.  We will consider performing renal angiography at the time of her catheterization tomorrow to exclude significant renal artery stenosis.  Follow-up: To be determined based on hospital course.  Rockney Grenz, MD 11/05/2017 4:17 PM  

## 2017-11-04 NOTE — Progress Notes (Signed)
Subjective:    Patient ID: Ashley Ryan, female    DOB: October 06, 1945, 72 y.o.   MRN: [id number]  HPI  72 year old female with DM,  presents for ER  ( seen 10/14)follow up for elevated BP and chest tightness.  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 Now on since June.  Cbc showed stable anemia  neg troponin  EKG a normal sinus rhythm at 58 bpm with a narrow QRS, normal axis, normal intervals, no concerning ST changes  Calcium low at 8.6 stable stable  protein low 6.2  CXR neg except showed Mild cardiomegaly and pulmonary vascular congestion Started on clonidine.   Wt Readings from Last 3 Encounters:  11/04/17 159 lb 8 oz (72.3 kg)  10/31/17 168 lb 6.4 oz (76.4 kg)  10/30/17 164 lb 12 oz (74.7 kg)     Hypertension: Improved control on amlodipine, atenolol 100 mg daily, losartan HCTZ, lasix as needed,  today. Told to use clonidine as needed. Took one this AM at 6 AM given BP 510 systolic. Using medication without problems or lightheadedness.  She has noted chest pressure off and on in last few days , notes when she is exterting herself.. More shortness of breath. Occ sweating.   Some soreness at scar site and associated pain but now more chest pain.  She  Has been more short of breath and tired in last few months.  Average home BPs: At night 130/60s ( she did not take the clonopin given it was well controlled), this AM BP was 200 Other issues:  BP Readings from Last 3 Encounters:  11/04/17 (!) 134/59  11/03/17 (!) 197/61  10/31/17 (!) 183/76    Father with CABG.. MI age 33, CAD MI   mother died age 57 from MI.  She is on baby aspirin.  Cholesterol well controlled last year.. On lipitor 40 mg daily. Lab Results  Component Value Date   CHOL 129 11/12/2016   HDL 47.30 11/12/2016   LDLCALC 62 11/12/2016   LDLDIRECT 102.5 07/21/2009   TRIG 101.0 11/12/2016   CHOLHDL 3  11/12/2016    Social History /Family History/Past Medical History reviewed in detail and updated in EMR if needed. Blood pressure (!) 134/59, pulse (!) 56, temperature 97.9 F (36.6 C), temperature source Oral, height 5\' 1"  (1.549 m), weight 159 lb 8 oz (72.3 kg).  Review of Systems  Constitutional: Positive for fatigue. Negative for fever.  HENT: Negative for congestion.   Eyes: Negative for pain.  Respiratory: Positive for shortness of breath. Negative for cough.   Cardiovascular: Positive for chest pain. Negative for palpitations and leg swelling.  Gastrointestinal: Negative for abdominal pain.  Genitourinary: Negative for dysuria and vaginal bleeding.  Musculoskeletal: Negative for back pain.  Neurological: Negative for syncope, light-headedness and headaches.  Psychiatric/Behavioral: Negative for dysphoric mood.       Objective:   Physical Exam  Constitutional: Vital signs are normal. She appears well-developed and well-nourished. She is cooperative.  Non-toxic appearance. She does not appear ill. No distress.  HENT:  Head: Normocephalic.  Right Ear: Hearing, tympanic membrane, external ear and ear canal normal. Tympanic membrane is not erythematous, not retracted and not bulging.  Left Ear: Hearing, tympanic membrane, external ear and ear canal normal. Tympanic membrane is not erythematous, not retracted and not bulging.  Nose: No mucosal edema or rhinorrhea. Right sinus exhibits no  maxillary sinus tenderness and no frontal sinus tenderness. Left sinus exhibits no maxillary sinus tenderness and no frontal sinus tenderness.  Mouth/Throat: Uvula is midline, oropharynx is clear and moist and mucous membranes are normal.  Eyes: Pupils are equal, round, and reactive to light. Conjunctivae, EOM and lids are normal. Lids are everted and swept, no foreign bodies found.  Neck: Trachea normal and normal range of motion. Neck supple. Carotid bruit is not present. No thyroid mass and no  thyromegaly present.  Cardiovascular: Normal rate, regular rhythm, S1 normal, S2 normal, normal heart sounds, intact distal pulses and normal pulses. Exam reveals no gallop and no friction rub.  No murmur heard. Pulmonary/Chest: Effort normal and breath sounds normal. No tachypnea. No respiratory distress. She has no decreased breath sounds. She has no wheezes. She has no rhonchi. She has no rales.  Abdominal: Soft. Normal appearance and bowel sounds are normal. There is no tenderness.  Neurological: She is alert.  Skin: Skin is warm, dry and intact. No rash noted.  Psychiatric: Her speech is normal and behavior is normal. Judgment and thought content normal. Her mood appears not anxious. Cognition and memory are normal. She does not exhibit a depressed mood.          Assessment & Plan:

## 2017-11-04 NOTE — Patient Instructions (Addendum)
Please stop at the front desk to set up referral.  Continue clondine for now.Marland Kitchen Until cardiology recommendations. Try to take clonidine at the same time day.  Continue aspirin daily.   If persistent  severe chest pain, and SOB.. Go to ER.

## 2017-11-05 ENCOUNTER — Observation Stay (HOSPITAL_BASED_OUTPATIENT_CLINIC_OR_DEPARTMENT_OTHER)
Admission: AD | Admit: 2017-11-05 | Discharge: 2017-11-05 | Disposition: A | Discharge status: Home / Self Care | Payer: Medicare HMO | Source: Ambulatory Visit | Attending: Internal Medicine | Admitting: Internal Medicine

## 2017-11-05 ENCOUNTER — Encounter: Payer: Self-pay | Admitting: Internal Medicine

## 2017-11-05 ENCOUNTER — Ambulatory Visit: Payer: Medicare HMO | Admitting: Internal Medicine

## 2017-11-05 ENCOUNTER — Other Ambulatory Visit: Payer: Self-pay

## 2017-11-05 ENCOUNTER — Observation Stay
Admission: AD | Admit: 2017-11-05 | Discharge: 2017-11-06 | Disposition: A | Discharge status: Home / Self Care | Payer: Medicare HMO | Source: Ambulatory Visit | Attending: Internal Medicine | Admitting: Internal Medicine

## 2017-11-05 VITALS — BP 138/70 | HR 54 | Resp 97 | Ht 60.0 in | Wt 156.2 lb

## 2017-11-05 DIAGNOSIS — Z9012 Acquired absence of left breast and nipple: Secondary | ICD-10-CM | POA: Diagnosis not present

## 2017-11-05 DIAGNOSIS — I200 Unstable angina: Secondary | ICD-10-CM | POA: Diagnosis not present

## 2017-11-05 DIAGNOSIS — E1140 Type 2 diabetes mellitus with diabetic neuropathy, unspecified: Secondary | ICD-10-CM | POA: Insufficient documentation

## 2017-11-05 DIAGNOSIS — K219 Gastro-esophageal reflux disease without esophagitis: Secondary | ICD-10-CM | POA: Insufficient documentation

## 2017-11-05 DIAGNOSIS — I110 Hypertensive heart disease with heart failure: Principal | ICD-10-CM | POA: Insufficient documentation

## 2017-11-05 DIAGNOSIS — I209 Angina pectoris, unspecified: Secondary | ICD-10-CM

## 2017-11-05 DIAGNOSIS — I10 Essential (primary) hypertension: Secondary | ICD-10-CM | POA: Diagnosis not present

## 2017-11-05 DIAGNOSIS — M1990 Unspecified osteoarthritis, unspecified site: Secondary | ICD-10-CM | POA: Diagnosis not present

## 2017-11-05 DIAGNOSIS — E538 Deficiency of other specified B group vitamins: Secondary | ICD-10-CM | POA: Diagnosis not present

## 2017-11-05 DIAGNOSIS — R0989 Other specified symptoms and signs involving the circulatory and respiratory systems: Secondary | ICD-10-CM | POA: Diagnosis not present

## 2017-11-05 DIAGNOSIS — M810 Age-related osteoporosis without current pathological fracture: Secondary | ICD-10-CM | POA: Insufficient documentation

## 2017-11-05 DIAGNOSIS — Z9049 Acquired absence of other specified parts of digestive tract: Secondary | ICD-10-CM | POA: Diagnosis not present

## 2017-11-05 DIAGNOSIS — Z7982 Long term (current) use of aspirin: Secondary | ICD-10-CM | POA: Diagnosis not present

## 2017-11-05 DIAGNOSIS — Z853 Personal history of malignant neoplasm of breast: Secondary | ICD-10-CM | POA: Insufficient documentation

## 2017-11-05 DIAGNOSIS — Z9842 Cataract extraction status, left eye: Secondary | ICD-10-CM | POA: Insufficient documentation

## 2017-11-05 DIAGNOSIS — Z882 Allergy status to sulfonamides status: Secondary | ICD-10-CM | POA: Diagnosis not present

## 2017-11-05 DIAGNOSIS — R001 Bradycardia, unspecified: Secondary | ICD-10-CM | POA: Diagnosis not present

## 2017-11-05 DIAGNOSIS — Z90710 Acquired absence of both cervix and uterus: Secondary | ICD-10-CM | POA: Insufficient documentation

## 2017-11-05 DIAGNOSIS — Z9841 Cataract extraction status, right eye: Secondary | ICD-10-CM | POA: Insufficient documentation

## 2017-11-05 DIAGNOSIS — E1159 Type 2 diabetes mellitus with other circulatory complications: Secondary | ICD-10-CM | POA: Insufficient documentation

## 2017-11-05 DIAGNOSIS — I5030 Unspecified diastolic (congestive) heart failure: Secondary | ICD-10-CM | POA: Insufficient documentation

## 2017-11-05 DIAGNOSIS — Z90721 Acquired absence of ovaries, unilateral: Secondary | ICD-10-CM | POA: Diagnosis not present

## 2017-11-05 DIAGNOSIS — Z79810 Long term (current) use of selective estrogen receptor modulators (SERMs): Secondary | ICD-10-CM | POA: Diagnosis not present

## 2017-11-05 DIAGNOSIS — Z6830 Body mass index (BMI) 30.0-30.9, adult: Secondary | ICD-10-CM | POA: Diagnosis not present

## 2017-11-05 DIAGNOSIS — R0602 Shortness of breath: Secondary | ICD-10-CM

## 2017-11-05 DIAGNOSIS — Z8719 Personal history of other diseases of the digestive system: Secondary | ICD-10-CM | POA: Insufficient documentation

## 2017-11-05 DIAGNOSIS — I420 Dilated cardiomyopathy: Secondary | ICD-10-CM | POA: Diagnosis not present

## 2017-11-05 DIAGNOSIS — R079 Chest pain, unspecified: Secondary | ICD-10-CM | POA: Diagnosis not present

## 2017-11-05 DIAGNOSIS — Z79899 Other long term (current) drug therapy: Secondary | ICD-10-CM | POA: Insufficient documentation

## 2017-11-05 DIAGNOSIS — E669 Obesity, unspecified: Secondary | ICD-10-CM | POA: Insufficient documentation

## 2017-11-05 DIAGNOSIS — D509 Iron deficiency anemia, unspecified: Secondary | ICD-10-CM | POA: Diagnosis not present

## 2017-11-05 DIAGNOSIS — Z9889 Other specified postprocedural states: Secondary | ICD-10-CM | POA: Insufficient documentation

## 2017-11-05 DIAGNOSIS — Z8249 Family history of ischemic heart disease and other diseases of the circulatory system: Secondary | ICD-10-CM | POA: Insufficient documentation

## 2017-11-05 DIAGNOSIS — K5790 Diverticulosis of intestine, part unspecified, without perforation or abscess without bleeding: Secondary | ICD-10-CM | POA: Diagnosis not present

## 2017-11-05 DIAGNOSIS — Z823 Family history of stroke: Secondary | ICD-10-CM | POA: Insufficient documentation

## 2017-11-05 LAB — CBC
HCT: 36.7 % (ref 36.0–46.0)
HEMATOCRIT: 35.8 % — AB (ref 36.0–46.0)
HEMOGLOBIN: 11.2 g/dL — AB (ref 12.0–15.0)
Hemoglobin: 11 g/dL — ABNORMAL LOW (ref 12.0–15.0)
MCH: 26.3 pg (ref 26.0–34.0)
MCH: 26.4 pg (ref 26.0–34.0)
MCHC: 30.7 g/dL (ref 30.0–36.0)
MCHC: 30.5 g/dL (ref 30.0–36.0)
MCV: 85.4 fL (ref 80.0–100.0)
MCV: 86.6 fL (ref 80.0–100.0)
NRBC: 0 % (ref 0.0–0.2)
PLATELETS: 173 10*3/uL (ref 150–400)
Platelets: 168 10*3/uL (ref 150–400)
RBC: 4.19 MIL/uL (ref 3.87–5.11)
RBC: 4.24 MIL/uL (ref 3.87–5.11)
RDW: 16 % — AB (ref 11.5–15.5)
RDW: 15.9 % — ABNORMAL HIGH (ref 11.5–15.5)
WBC: 5.6 10*3/uL (ref 4.0–10.5)
WBC: 6.1 10*3/uL (ref 4.0–10.5)
nRBC: 0 % (ref 0.0–0.2)

## 2017-11-05 LAB — COMPREHENSIVE METABOLIC PANEL
ALT: 16 U/L (ref 0–44)
AST: 18 U/L (ref 15–41)
Albumin: 3.8 g/dL (ref 3.5–5.0)
Alkaline Phosphatase: 43 U/L (ref 38–126)
Anion gap: 8 (ref 5–15)
BUN: 13 mg/dL (ref 8–23)
CO2: 29 mmol/L (ref 22–32)
Calcium: 8.6 mg/dL — ABNORMAL LOW (ref 8.9–10.3)
Chloride: 101 mmol/L (ref 98–111)
Creatinine, Ser: 0.61 mg/dL (ref 0.44–1.00)
GFR calc Af Amer: >60 mL/min (ref >60)
Glucose, Bld: 109 mg/dL — ABNORMAL HIGH (ref 70–99)
POTASSIUM: 3 mmol/L — AB (ref 3.5–5.1)
Sodium: 138 mmol/L (ref 135–145)
Total Bilirubin: 0.4 mg/dL (ref 0.3–1.2)
Total Protein: 6.5 g/dL (ref 6.5–8.1)

## 2017-11-05 LAB — TROPONIN I: Troponin I: <0.03 ng/mL (ref <0.03)

## 2017-11-05 MED ORDER — ONDANSETRON HCL 4 MG/2ML IJ SOLN
4.0000 mg | Freq: Four times a day (QID) | INTRAMUSCULAR | Status: DC | PRN
  Administered 2017-11-06: 4 mg via INTRAVENOUS
  Filled 2017-11-05: qty 2

## 2017-11-05 MED ORDER — MORPHINE SULFATE (PF) 2 MG/ML IV SOLN
2.0000 mg | INTRAVENOUS | Status: DC | PRN
  Administered 2017-11-06: 2 mg via INTRAVENOUS
  Filled 2017-11-05: qty 1

## 2017-11-05 MED ORDER — SODIUM CHLORIDE 0.9 % IV SOLN: 250.0000 mL | INTRAVENOUS | Status: DC | PRN

## 2017-11-05 MED ORDER — ASPIRIN 81 MG PO CHEW
81.0000 mg | CHEWABLE_TABLET | ORAL | Status: AC
  Administered 2017-11-06: 81 mg via ORAL
  Filled 2017-11-05: qty 1

## 2017-11-05 MED ORDER — ATORVASTATIN CALCIUM 20 MG PO TABS
40.0000 mg | ORAL_TABLET | Freq: Every day | ORAL | Status: DC
  Administered 2017-11-05: 40 mg via ORAL
  Filled 2017-11-05: qty 2

## 2017-11-05 MED ORDER — SODIUM CHLORIDE 0.9% FLUSH
3.0000 mL | INTRAVENOUS | Status: DC | PRN
  Administered 2017-11-05: 3 mL via INTRAVENOUS
  Filled 2017-11-05: qty 3

## 2017-11-05 MED ORDER — PANTOPRAZOLE SODIUM 40 MG PO TBEC
40.0000 mg | DELAYED_RELEASE_TABLET | Freq: Every day | ORAL | Status: DC
  Administered 2017-11-05: 40 mg via ORAL
  Filled 2017-11-05: qty 1

## 2017-11-05 MED ORDER — AMLODIPINE BESYLATE 5 MG PO TABS
5.0000 mg | ORAL_TABLET | Freq: Every day | ORAL | Status: DC
  Filled 2017-11-05 (×2): qty 1

## 2017-11-05 MED ORDER — FLUTICASONE PROPIONATE 50 MCG/ACT NA SUSP
2.0000 | Freq: Every day | NASAL | Status: DC | PRN
  Filled 2017-11-05: qty 16

## 2017-11-05 MED ORDER — ASPIRIN EC 81 MG PO TBEC: 81.0000 mg | DELAYED_RELEASE_TABLET | Freq: Every day | ORAL | Status: DC

## 2017-11-05 MED ORDER — SODIUM CHLORIDE 0.9 % IV SOLN
INTRAVENOUS | Status: DC
  Administered 2017-11-06: via INTRAVENOUS

## 2017-11-05 MED ORDER — LORATADINE 10 MG PO TABS
10.0000 mg | ORAL_TABLET | Freq: Every day | ORAL | Status: DC
  Filled 2017-11-05: qty 1

## 2017-11-05 MED ORDER — NITROGLYCERIN 0.4 MG SL SUBL: 0.4000 mg | SUBLINGUAL_TABLET | SUBLINGUAL | Status: DC | PRN

## 2017-11-05 MED ORDER — SODIUM CHLORIDE 0.9% FLUSH: 3.0000 mL | INTRAVENOUS | Status: DC | PRN

## 2017-11-05 MED ORDER — GABAPENTIN 300 MG PO CAPS
300.0000 mg | ORAL_CAPSULE | Freq: Every day | ORAL | Status: DC
  Administered 2017-11-05: 300 mg via ORAL
  Filled 2017-11-05: qty 1

## 2017-11-05 MED ORDER — CLONIDINE HCL 0.1 MG PO TABS: 0.1000 mg | ORAL_TABLET | Freq: Two times a day (BID) | ORAL | Status: DC | PRN

## 2017-11-05 MED ORDER — CALCIUM CARBONATE-VITAMIN D 500-200 MG-UNIT PO TABS
1.0000 | ORAL_TABLET | Freq: Two times a day (BID) | ORAL | Status: DC
  Filled 2017-11-05: qty 1

## 2017-11-05 MED ORDER — ATENOLOL 100 MG PO TABS
100.0000 mg | ORAL_TABLET | Freq: Every day | ORAL | Status: DC
  Filled 2017-11-05 (×2): qty 1

## 2017-11-05 MED ORDER — CALCIUM POLYCARBOPHIL 625 MG PO TABS
625.0000 mg | ORAL_TABLET | Freq: Every day | ORAL | Status: DC
  Administered 2017-11-05: 625 mg via ORAL
  Filled 2017-11-05 (×2): qty 1

## 2017-11-05 MED ORDER — FLUOXETINE HCL 20 MG PO CAPS
20.0000 mg | ORAL_CAPSULE | Freq: Every day | ORAL | Status: DC
  Filled 2017-11-05 (×2): qty 1

## 2017-11-05 MED ORDER — SODIUM CHLORIDE 0.9% FLUSH
3.0000 mL | Freq: Two times a day (BID) | INTRAVENOUS | Status: DC
  Administered 2017-11-05: 3 mL via INTRAVENOUS

## 2017-11-05 MED ORDER — LATANOPROST 0.005 % OP SOLN
1.0000 [drp] | Freq: Every day | OPHTHALMIC | Status: DC
  Administered 2017-11-05: 1 [drp] via OPHTHALMIC
  Filled 2017-11-05 (×2): qty 2.5

## 2017-11-05 MED ORDER — OMEGA-3-ACID ETHYL ESTERS 1 G PO CAPS
1.0000 g | ORAL_CAPSULE | Freq: Every day | ORAL | Status: DC
  Administered 2017-11-05: 1 g via ORAL
  Filled 2017-11-05: qty 1

## 2017-11-05 MED ORDER — ENOXAPARIN SODIUM 40 MG/0.4ML ~~LOC~~ SOLN
40.0000 mg | SUBCUTANEOUS | Status: DC
  Administered 2017-11-05: 40 mg via SUBCUTANEOUS
  Filled 2017-11-05: qty 0.4

## 2017-11-05 MED ORDER — ASPIRIN 81 MG PO CHEW
324.0000 mg | CHEWABLE_TABLET | ORAL | Status: AC
  Administered 2017-11-05: 324 mg via ORAL
  Filled 2017-11-05: qty 4

## 2017-11-05 MED ORDER — ISOSORBIDE MONONITRATE ER 30 MG PO TB24
30.0000 mg | ORAL_TABLET | Freq: Every day | ORAL | Status: DC
Start: 2017-11-05 — End: 2017-11-06
  Administered 2017-11-05: 30 mg via ORAL
  Filled 2017-11-05: qty 1

## 2017-11-05 MED ORDER — ASPIRIN 300 MG RE SUPP: 300.0000 mg | RECTAL | Status: AC

## 2017-11-05 MED ORDER — ACETAMINOPHEN 325 MG PO TABS
650.0000 mg | ORAL_TABLET | ORAL | Status: DC | PRN
  Administered 2017-11-05 – 2017-11-06 (×4): 650 mg via ORAL
  Filled 2017-11-05 (×3): qty 2

## 2017-11-05 MED ORDER — SODIUM CHLORIDE 0.9% FLUSH: 3.0000 mL | Freq: Two times a day (BID) | INTRAVENOUS | Status: DC

## 2017-11-05 NOTE — Progress Notes (Signed)
Advanced care plan.  Purpose of the Encounter: CODE STATUS  Parties in Attendance: Patient  Patient's Decision Capacity: Good  Subjective/Patient's story: Was referred from cardiology office for evaluation for chest pain elevated blood pressure   Objective/Medical story Patient will need cardiac cath and intervention by cardiology We will cycle troponin and control chest pain and blood pressure    Goals of care determination:  Advance care directives and goals of care discussed Treatment plan discussed Patient wants everything done which includes CPR, intubation ventilator if the need arises   CODE STATUS: Full code   Time spent discussing advanced care planning: 16 minutes

## 2017-11-05 NOTE — H&P (Signed)
[facility] NAME: Ashley Ryan    MR#:  [id number]  DATE OF BIRTH:  11-03-1945  DATE OF ADMISSION:  11/05/2017  PRIMARY CARE PHYSICIAN: [name], MD   REQUESTING/REFERRING PHYSICIAN:   CHIEF COMPLAINT: Chest pain  HISTORY OF PRESENT ILLNESS: Ashley Ryan  is a 72 y.o. female with a known history of GERD, colonic diverticulosis, hiatal hernia, hypertension, neuropathy was referred from cardiology office for chest pain.  Patient has this ongoing chest pain for the last couple of days and also has elevated blood pressure which has been tougher to control.  Pain is located in left-sided chest 5 out of 10 on scale of 1-10.  She was referred by Dr.End from his office.  No complaints of any shortness of breath, orthopnea.  No complaints of any blurry vision, syncope, paresthesias.  PAST MEDICAL HISTORY:   Past Medical History:  Diagnosis Date  . Anemia   . Arthritis   . Chronic headache   . Complication of anesthesia    prior to 1991 used to have PONV.  none recently.  . Diverticulosis of colon   . Family history of adverse reaction to anesthesia    mother/daighter get sick  . GERD with stricture   . Hard of hearing   . Heart murmur    followed by PCP  . Hiatal hernia   . Hypertension   . Neuropathy    feet  . Osteoporosis   . Pre-diabetes   . Wears dentures    full upper    PAST SURGICAL HISTORY:  Past Surgical History:  Procedure Laterality Date  . ABDOMINAL HYSTERECTOMY  1978   one ovary remains  . BREAST BIOPSY Left 05/26/2017   Affirm Bx- path pending - coil clip  . CARDIAC CATHETERIZATION     "yrs ago" all OK.  Marland Kitchen CATARACT EXTRACTION W/PHACO Left 02/21/2016   Procedure: CATARACT EXTRACTION PHACO AND INTRAOCULAR LENS PLACEMENT ([place])  Left eye;  Surgeon: Leandrew Koyanagi, MD;  Location: [facility];  Service: Ophthalmology;  Laterality: Left;  Left eye Diabetic  . CATARACT EXTRACTION W/PHACO  Right 03/13/2016   Procedure: CATARACT EXTRACTION PHACO AND INTRAOCULAR LENS PLACEMENT ([place])  right  diabetic;  Surgeon: Leandrew Koyanagi, MD;  Location: [facility];  Service: Ophthalmology;  Laterality: Right;  diabetic  . CHOLECYSTECTOMY  1978  . OOPHORECTOMY     one still left  . SENTINEL NODE BIOPSY Left 06/13/2017   Procedure: SENTINEL NODE BIOPSY;  Surgeon: Robert Bellow, MD;  Location: [facility];  Service: General;  Laterality: Left;  . 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  Surgeon: Robert Bellow, MD;  Location: [facility];  Service: General;  Laterality: Left;    SOCIAL HISTORY:  Social History   Tobacco Use  . Smoking status: Never Smoker  . Smokeless tobacco: Never Used  Substance Use Topics  . Alcohol use: No    FAMILY HISTORY:  Family History  Problem Relation Age of Onset  . Breast cancer Unknown   . Stomach cancer Unknown   . Diabetes Unknown   . Heart disease Unknown   . Breast cancer Sister 30  . Esophageal cancer Brother   . Hypertension Mother   . Heart attack Mother 87  . Hypertension Father   . Coronary artery disease Father 51       CABG  . Stroke  Father     DRUG ALLERGIES:  Allergies  Allergen Reactions  . Sulfa Antibiotics Rash    Childhood reaction  . Sulfonamide Derivatives Rash    Childhood reaction.    REVIEW OF SYSTEMS:   CONSTITUTIONAL: No fever, fatigue or weakness.  EYES: No blurred or double vision.  EARS, NOSE, AND THROAT: No tinnitus or ear pain.  RESPIRATORY: No cough, shortness of breath, wheezing or hemoptysis.  CARDIOVASCULAR: Has chest pain, no orthopnea, edema.  GASTROINTESTINAL: No nausea, vomiting, diarrhea or abdominal pain.  GENITOURINARY: No dysuria, hematuria.  ENDOCRINE: No polyuria, nocturia,  HEMATOLOGY: No anemia, easy bruising or bleeding SKIN: No rash or lesion. MUSCULOSKELETAL: No joint pain or arthritis.    NEUROLOGIC: No tingling, numbness, weakness.  PSYCHIATRY: No anxiety or depression.   MEDICATIONS AT HOME:  Prior to Admission medications   Medication Sig Start Date End Date Taking? Authorizing Provider  ACCU-CHEK SOFTCLIX LANCETS lancets Use to check blood sugar once daily 02/09/14   Bedsole, Amy E, MD  Alcohol Swabs (B-D SINGLE USE SWABS REGULAR) PADS Use to check blood sugar once daily 02/09/14   Bedsole, Amy E, MD  amLODipine (NORVASC) 5 MG tablet Take 1 tablet (5 mg total) by mouth daily. 11/21/15   Bedsole, Amy E, MD  aspirin EC 81 MG tablet Take 81 mg by mouth daily.  07/22/02   [provider]  atenolol (TENORMIN) 100 MG tablet TAKE 1 TABLET EVERY DAY Patient taking differently: Take 100 mg by mouth daily.  09/09/17   Bedsole, Amy E, MD  atorvastatin (LIPITOR) 40 MG tablet TAKE 1 TABLET EVERY DAY at bedtime Patient taking differently: Take 40 mg by mouth at bedtime.  10/29/17   Bedsole, Amy E, MD  Blood Glucose Calibration (ACCU-CHEK AVIVA) SOLN Use to check blood sugar once daily 02/09/14   Bedsole, Amy E, MD  Blood Glucose Monitoring Suppl (ACCU-CHEK AVIVA PLUS) W/DEVICE KIT Use to check blood sugar once daily 02/09/14   Bedsole, Amy E, MD  Calcium Carbonate-Vitamin D (CALCIUM 600+D) 600-400 MG-UNIT per tablet Take 1 tablet by mouth 2 (two) times daily.     [provider]  cloNIDine (CATAPRES) 0.1 MG tablet Take 1 tablet (0.1 mg total) by mouth 2 (two) times daily as needed (Systolic blood pressure greater than 180). 11/03/17 11/03/18  Harvest Dark, MD  esomeprazole (NEXIUM) 40 MG capsule TAKE 1 CAPSULE 2 (TWO) TIMES DAILY BEFORE A MEAL. Patient taking differently: Take 40 mg by mouth 2 (two) times daily before a meal.  03/20/17   Bedsole, Amy E, MD  FLUoxetine (PROZAC) 20 MG capsule TAKE 1 CAPSULE EVERY DAY Patient taking differently: Take 20 mg by mouth daily.  05/12/17   Bedsole, Amy E, MD  fluticasone (FLONASE) 50 MCG/ACT nasal spray Place 2 sprays into both  nostrils daily as needed for allergies or rhinitis.    [provider]  furosemide (LASIX) 20 MG tablet Take 1 tablet (20 mg total) by mouth daily. Prn lower extremity edema Patient taking differently: Take 20 mg by mouth daily as needed for fluid or edema.  10/30/17   Copland, Frederico Hamman, MD  gabapentin (NEURONTIN) 100 MG capsule TAKE 1 CAPSULE IN THE MORNING, 1 CAPSULE AT LUNCH, 1 CAPSULES AT DINNER AND 2 TO 3 CAPSULES AT BEDTIME Patient taking differently: Take 100-300 mg by mouth See admin instructions. 100 mg daily with breakfast, 100 mg daily with dinner, and 300 mg daily at bedtime 03/19/17   Bedsole, Amy E, MD  Glucosamine 500 MG TABS  Take 500 mg by mouth daily.     [provider]  glucose blood (ACCU-CHEK AVIVA) test strip Use to check blood sugar once daily 02/09/14   Bedsole, Amy E, MD  latanoprost (XALATAN) 0.005 % ophthalmic solution Place 1 drop into both eyes at bedtime.    [provider]  lidocaine (LIDODERM) 5 % APPLY 1 PATCH ONTO THE SKIN EVERY DAY. REMOVE AND DISCARD PATCH WITHIN 12 HOURS OR AS DIRECTED BY PHYSICIAN. Patient taking differently: Place 1 patch onto the skin daily as needed (for pain).  08/14/17   Bedsole, Amy E, MD  loratadine (CLARITIN) 10 MG tablet Take 10 mg by mouth daily.    [provider]  losartan-hydrochlorothiazide (HYZAAR) 100-25 MG tablet TAKE 1 TABLET EVERY DAY Patient taking differently: Take 1 tablet by mouth daily.  03/20/17   Bedsole, Amy E, MD  Omega-3 Fatty Acids (FISH OIL) 1000 MG CAPS Take 1,000 mg by mouth daily.     [provider]  phenylephrine (SUDAFED PE) 10 MG TABS tablet Take 10 mg by mouth every 4 (four) hours as needed (for nasal congestion).     [provider]  polycarbophil (FIBERCON) 625 MG tablet Take 625 mg by mouth daily.    [provider]  tamoxifen (NOLVADEX) 20 MG tablet Take 1 tablet (20 mg total) by mouth daily. 08/29/17   Karen Kitchens, NP  traMADol (ULTRAM) 50 MG  tablet Take 1 tablet (50 mg total) by mouth every 6 (six) hours as needed. for pain Patient taking differently: Take 50 mg by mouth every 6 (six) hours as needed for moderate pain or severe pain.  08/08/17   Bedsole, Amy E, MD  vitamin B-12 (CYANOCOBALAMIN) 1000 MCG tablet Take 1 tablet (1,000 mcg total) by mouth daily. Patient not taking: Reported on 11/05/2017 07/04/17   Karen Kitchens, NP      PHYSICAL EXAMINATION:   VITAL SIGNS: Blood pressure (!) 166/60, pulse (!) 51, temperature 98 F (36.7 C), resp. rate 18, height 5' (1.524 m), weight 70.4 kg, SpO2 99 %.  GENERAL:  72 y.o.-year-old patient lying in the bed with no acute distress.  EYES: Pupils equal, round, reactive to light and accommodation. No scleral icterus. Extraocular muscles intact.  HEENT: Head atraumatic, normocephalic. Oropharynx and nasopharynx clear.  NECK:  Supple, no jugular venous distention. No thyroid enlargement, no tenderness.  LUNGS: Normal breath sounds bilaterally, no wheezing, rales,rhonchi or crepitation. No use of accessory muscles of respiration.  CARDIOVASCULAR: S1, S2 normal. No murmurs, rubs, or gallops.  ABDOMEN: Soft, nontender, nondistended. Bowel sounds present. No organomegaly or mass.  EXTREMITIES: No pedal edema, cyanosis, or clubbing.  NEUROLOGIC: Cranial nerves II through XII are intact. Muscle strength 5/5 in all extremities. Sensation intact. Gait not checked.  PSYCHIATRIC: The patient is alert and oriented x 3.  SKIN: No obvious rash, lesion, or ulcer.   LABORATORY PANEL:   CBC Recent Labs  Lab 10/31/17 1028 11/03/17 1004 11/05/17 1817  WBC 4.0 4.7 5.6  HGB 9.7* 10.6* 11.0*  HCT 32.0* 34.0* 35.8*  PLT 132* 143* 168  MCV 85.6 85.4 85.4  MCH 25.9* 26.6 26.3  MCHC 30.3 31.2 30.7  RDW 16.1* 15.9* 16.0*  LYMPHSABS 0.8  --   --   MONOABS 0.4  --   --   EOSABS 0.0  --   --   BASOSABS 0.0  --   --     ------------------------------------------------------------------------------------------------------------------  Chemistries  Recent Labs  Lab 10/31/17 1028 11/03/17  1004  NA 139 138  K 3.5 4.0  CL 106 101  CO2 26 27  GLUCOSE 103* 97  BUN 10 11  CREATININE 0.52 0.59  CALCIUM 8.8* 8.6*  AST 16 23  ALT 15 16  ALKPHOS 46 44  BILITOT 0.4 1.0   ------------------------------------------------------------------------------------------------------------------ estimated creatinine clearance is 54.1 mL/min (by C-G formula based on SCr of 0.59 mg/dL). ------------------------------------------------------------------------------------------------------------------ No results for input(s): TSH, T4TOTAL, T3FREE, THYROIDAB in the last 72 hours.  Invalid input(s): FREET3   Coagulation profile No results for input(s): INR, PROTIME in the last 168 hours. ------------------------------------------------------------------------------------------------------------------- No results for input(s): DDIMER in the last 72 hours. -------------------------------------------------------------------------------------------------------------------  Cardiac Enzymes Recent Labs  Lab 11/03/17 1004  TROPONINI <0.03   ------------------------------------------------------------------------------------------------------------------ Invalid input(s): POCBNP  ---------------------------------------------------------------------------------------------------------------  Urinalysis    Component Value Date/Time   COLORURINE Yellow 11/11/2013 0904   APPEARANCEUR Clear 11/11/2013 0904   LABSPEC 1.018 11/11/2013 0904   PHURINE 7.0 11/11/2013 0904   GLUCOSEU Negative 11/11/2013 0904   HGBUR Negative 11/11/2013 0904   BILIRUBINUR Negative 11/11/2013 0904   KETONESUR Negative 11/11/2013 0904   PROTEINUR Negative 11/11/2013 0904   NITRITE Negative 11/11/2013 0904   LEUKOCYTESUR Negative 11/11/2013  0904     RADIOLOGY: No results found.  EKG: Orders placed or performed during the hospital encounter of 11/05/17  . EKG 12-Lead  . EKG 12-Lead    IMPRESSION AND PLAN:  72 year old elderly female patient with history of hypertension, GERD, colonic diverticulosis, hiatal hernia was referred by cardiology office for chest pain  -Unstable angina Admit patient to telemetry observation bed Start patient on aspirin and nitrates DVT prophylaxis with subcu Lovenox Check echocardiogram and cardiology consultation Cycle troponin rule out ischemia N.p.o. after midnight for possible cardiac cath and intervention Check twelve-lead EKG  -Uncontrolled hypertension Resume beta-blocker, clonidine and Norvasc for better control blood pressure  -GERD Oral proton pump inhibitor  -DVT prophylaxis subcu Lovenox daily  All the records are reviewed and case discussed with ED provider. Management plans discussed with the patient, family and they are in agreement.  CODE STATUS:Full code    TOTAL TIME TAKING CARE OF THIS PATIENT: 51 minutes.    Saundra Shelling M.D on 11/05/2017 at 6:45 PM  Between 7am to 6pm - Pager - [phone]  After 6pm go to [email or web address] - password [email or web address] [facility]  Office  [phone]  CC: Primary care physician; [name], MD

## 2017-11-06 ENCOUNTER — Encounter: Payer: Self-pay | Admitting: Internal Medicine

## 2017-11-06 ENCOUNTER — Ambulatory Visit: Payer: Medicare HMO | Admitting: General Surgery

## 2017-11-06 ENCOUNTER — Encounter
Admission: AD | Disposition: A | Discharge status: Home / Self Care | Payer: Self-pay | Source: Ambulatory Visit | Attending: Internal Medicine

## 2017-11-06 DIAGNOSIS — K219 Gastro-esophageal reflux disease without esophagitis: Secondary | ICD-10-CM | POA: Diagnosis not present

## 2017-11-06 DIAGNOSIS — K5790 Diverticulosis of intestine, part unspecified, without perforation or abscess without bleeding: Secondary | ICD-10-CM | POA: Diagnosis not present

## 2017-11-06 DIAGNOSIS — I110 Hypertensive heart disease with heart failure: Secondary | ICD-10-CM | POA: Diagnosis not present

## 2017-11-06 DIAGNOSIS — E1140 Type 2 diabetes mellitus with diabetic neuropathy, unspecified: Secondary | ICD-10-CM | POA: Diagnosis not present

## 2017-11-06 DIAGNOSIS — R0989 Other specified symptoms and signs involving the circulatory and respiratory systems: Secondary | ICD-10-CM | POA: Diagnosis not present

## 2017-11-06 DIAGNOSIS — E538 Deficiency of other specified B group vitamins: Secondary | ICD-10-CM | POA: Diagnosis not present

## 2017-11-06 DIAGNOSIS — E669 Obesity, unspecified: Secondary | ICD-10-CM | POA: Diagnosis not present

## 2017-11-06 DIAGNOSIS — I200 Unstable angina: Secondary | ICD-10-CM

## 2017-11-06 DIAGNOSIS — D509 Iron deficiency anemia, unspecified: Secondary | ICD-10-CM | POA: Diagnosis not present

## 2017-11-06 DIAGNOSIS — I5030 Unspecified diastolic (congestive) heart failure: Secondary | ICD-10-CM | POA: Diagnosis not present

## 2017-11-06 DIAGNOSIS — M1990 Unspecified osteoarthritis, unspecified site: Secondary | ICD-10-CM | POA: Diagnosis not present

## 2017-11-06 DIAGNOSIS — I10 Essential (primary) hypertension: Secondary | ICD-10-CM | POA: Diagnosis not present

## 2017-11-06 DIAGNOSIS — I420 Dilated cardiomyopathy: Secondary | ICD-10-CM | POA: Diagnosis not present

## 2017-11-06 HISTORY — PX: LEFT HEART CATH AND CORONARY ANGIOGRAPHY: CATH118249

## 2017-11-06 HISTORY — PX: RENAL ANGIOGRAPHY: CATH118260

## 2017-11-06 LAB — ECHOCARDIOGRAM COMPLETE
HEIGHTINCHES: 60 in
Weight: 2483.2 oz

## 2017-11-06 LAB — BASIC METABOLIC PANEL
Anion gap: 8 (ref 5–15)
BUN: 13 mg/dL (ref 8–23)
CO2: 29 mmol/L (ref 22–32)
Calcium: 8.2 mg/dL — ABNORMAL LOW (ref 8.9–10.3)
Chloride: 101 mmol/L (ref 98–111)
Creatinine, Ser: 0.6 mg/dL (ref 0.44–1.00)
GFR calc Af Amer: >60 mL/min (ref >60)
GFR calc non Af Amer: >60 mL/min (ref >60)
GLUCOSE: 97 mg/dL (ref 70–99)
POTASSIUM: 3.3 mmol/L — AB (ref 3.5–5.1)
Sodium: 138 mmol/L (ref 135–145)

## 2017-11-06 LAB — LIPID PANEL
CHOL/HDL RATIO: 2.6 ratio
Cholesterol: 97 mg/dL (ref 0–200)
HDL: 37 mg/dL — AB (ref >40)
LDL CALC: 41 mg/dL (ref 0–99)
Triglycerides: 97 mg/dL (ref <150)
VLDL: 19 mg/dL (ref 0–40)

## 2017-11-06 LAB — CBC
HEMATOCRIT: 30.2 % — AB (ref 36.0–46.0)
Hemoglobin: 9.3 g/dL — ABNORMAL LOW (ref 12.0–15.0)
MCH: 26.5 pg (ref 26.0–34.0)
MCHC: 30.8 g/dL (ref 30.0–36.0)
MCV: 86 fL (ref 80.0–100.0)
PLATELETS: 143 10*3/uL — AB (ref 150–400)
RBC: 3.51 MIL/uL — ABNORMAL LOW (ref 3.87–5.11)
RDW: 16 % — ABNORMAL HIGH (ref 11.5–15.5)
WBC: 4.2 10*3/uL (ref 4.0–10.5)
nRBC: 0 % (ref 0.0–0.2)

## 2017-11-06 LAB — TROPONIN I: Troponin I: <0.03 ng/mL (ref <0.03)

## 2017-11-06 LAB — PROTIME-INR
INR: 1.16
PROTHROMBIN TIME: 14.7 s (ref 11.4–15.2)

## 2017-11-06 SURGERY — LEFT HEART CATH AND CORONARY ANGIOGRAPHY
Anesthesia: Moderate Sedation

## 2017-11-06 MED ORDER — FUROSEMIDE 20 MG PO TABS: 20.0000 mg | ORAL_TABLET | Freq: Every day | ORAL | Status: DC

## 2017-11-06 MED ORDER — FENTANYL CITRATE (PF) 100 MCG/2ML IJ SOLN
INTRAMUSCULAR | Status: DC | PRN
  Administered 2017-11-06: 50 ug via INTRAVENOUS

## 2017-11-06 MED ORDER — ENOXAPARIN SODIUM 40 MG/0.4ML ~~LOC~~ SOLN: 40.0000 mg | SUBCUTANEOUS | Status: DC

## 2017-11-06 MED ORDER — ISOSORBIDE MONONITRATE ER 30 MG PO TB24: 30.0000 mg | ORAL_TABLET | Freq: Every day | ORAL | 1 refills | Status: DC

## 2017-11-06 MED ORDER — HEPARIN (PORCINE) IN NACL 1000-0.9 UT/500ML-% IV SOLN
INTRAVENOUS | Status: AC
  Filled 2017-11-06: qty 1000

## 2017-11-06 MED ORDER — LOSARTAN POTASSIUM-HCTZ 100-25 MG PO TABS: 0.5000 | ORAL_TABLET | Freq: Every day | ORAL | 1 refills | Status: DC

## 2017-11-06 MED ORDER — MIDAZOLAM HCL 2 MG/2ML IJ SOLN
INTRAMUSCULAR | Status: AC
  Filled 2017-11-06: qty 2

## 2017-11-06 MED ORDER — FUROSEMIDE 20 MG PO TABS: 20.0000 mg | ORAL_TABLET | Freq: Every day | ORAL | 1 refills | Status: DC

## 2017-11-06 MED ORDER — FENTANYL CITRATE (PF) 100 MCG/2ML IJ SOLN
INTRAMUSCULAR | Status: AC
  Filled 2017-11-06: qty 2

## 2017-11-06 MED ORDER — ACETAMINOPHEN 325 MG PO TABS
ORAL_TABLET | ORAL | Status: AC
  Filled 2017-11-06: qty 2

## 2017-11-06 MED ORDER — POTASSIUM CHLORIDE CRYS ER 20 MEQ PO TBCR
40.0000 meq | EXTENDED_RELEASE_TABLET | Freq: Once | ORAL | Status: AC
  Administered 2017-11-06: 40 meq via ORAL
  Filled 2017-11-06: qty 2

## 2017-11-06 MED ORDER — MIDAZOLAM HCL 2 MG/2ML IJ SOLN
INTRAMUSCULAR | Status: DC | PRN
  Administered 2017-11-06: 1 mg via INTRAVENOUS

## 2017-11-06 MED ORDER — POTASSIUM CHLORIDE ER 10 MEQ PO TBCR: 10.0000 meq | EXTENDED_RELEASE_TABLET | Freq: Every day | ORAL | 1 refills | Status: DC

## 2017-11-06 MED ORDER — IOPAMIDOL (ISOVUE-300) INJECTION 61%
INTRAVENOUS | Status: DC | PRN
  Administered 2017-11-06: 80 mL via INTRA_ARTERIAL

## 2017-11-06 MED ORDER — ATORVASTATIN CALCIUM 20 MG PO TABS: 20.0000 mg | ORAL_TABLET | Freq: Every day | ORAL | Status: DC

## 2017-11-06 MED ORDER — SODIUM CHLORIDE 0.9% FLUSH: 3.0000 mL | Freq: Two times a day (BID) | INTRAVENOUS | Status: DC

## 2017-11-06 MED ORDER — SODIUM CHLORIDE 0.9 % IV SOLN: 250.0000 mL | INTRAVENOUS | Status: DC | PRN

## 2017-11-06 MED ORDER — SODIUM CHLORIDE 0.9 % IV SOLN: INTRAVENOUS | Status: AC

## 2017-11-06 MED ORDER — SODIUM CHLORIDE 0.9% FLUSH: 3.0000 mL | INTRAVENOUS | Status: DC | PRN

## 2017-11-06 SURGICAL SUPPLY — 10 items
CANNULA 5F STIFF (CANNULA) ×4 IMPLANT
CATH INFINITI 5FR ANG PIGTAIL (CATHETERS) ×4 IMPLANT
CATH INFINITI 5FR JL4 (CATHETERS) ×4 IMPLANT
CATH INFINITI JR4 5F (CATHETERS) ×4 IMPLANT
DEVICE CLOSURE MYNXGRIP 5F (Vascular Products) ×4 IMPLANT
KIT MANI 3VAL PERCEP (MISCELLANEOUS) ×4 IMPLANT
NEEDLE PERC 18GX7CM (NEEDLE) ×4 IMPLANT
PACK CARDIAC CATH (CUSTOM PROCEDURE TRAY) ×4 IMPLANT
SHEATH AVANTI 5FR X 11CM (SHEATH) ×4 IMPLANT
WIRE GUIDERIGHT .035X150 (WIRE) ×4 IMPLANT

## 2017-11-06 NOTE — Discharge Planning (Signed)
Patient IV and tele removed.  Cath site clean, dry and intact.  RN assessment and VS revealed stability for DC to home.  Discharge papers given, explained and educated.  Discussed suggestions for FU appts and appts made. Scripts e-scribed to [facility].  Once ready - will be wheeled to front and family transporting home.  Waiting on family arrival.

## 2017-11-06 NOTE — Progress Notes (Signed)
Patient has no acute event overnight. She remained in asymptomatic sinus brady with stable VS. Patient is kept NPO per order, and consent form signed for scheduled heart catheterization in the AM. Care plan reviewed with patient .

## 2017-11-06 NOTE — Care Management Note (Signed)
Case Management Note  Patient Details  Name: Ashley Ryan MRN: [id number] Date of Birth: [birth year]  Subjective/Objective:      Patient placed in observation with chest pain.  Troponins are negative.    Patient presents from home and independent in all adls. No issues accessing medical care, obtaining medications, maintaining housing, utilities and food.   No discharge needs identified at present time.                 Action/Plan:   Expected Discharge Date:  11/06/17               Expected Discharge Plan:  Home/Self Care  In-House Referral:     Discharge planning Services  CM Consult  Post Acute Care Choice:    Choice offered to:     DME Arranged:    DME Agency:     HH Arranged:    HH Agency:     Status of Service:  Completed, signed off  If discussed at [employer] of Stay Meetings, dates discussed:    Additional Comments:  Elza Rafter, RN 11/06/2017, 1:37 PM

## 2017-11-06 NOTE — Progress Notes (Signed)
Progress Note  Patient Name: Ashley Ryan Date of Encounter: 11/06/2017  Primary Cardiologist: Nelva Bush, MD  Subjective   Patient currently chest pain free but notes intermittent episodes of chest pressure overnight.  No shortness of breath.  Inpatient Medications    Scheduled Meds: . [MAR Hold] amLODipine  5 mg Oral Daily  . [MAR Hold] aspirin EC  81 mg Oral Daily  . [MAR Hold] atenolol  100 mg Oral Daily  . [MAR Hold] atorvastatin  40 mg Oral QHS  . [MAR Hold] calcium-vitamin D  1 tablet Oral BID  . [MAR Hold] enoxaparin (LOVENOX) injection  40 mg Subcutaneous Q24H  . [MAR Hold] FLUoxetine  20 mg Oral Daily  . [MAR Hold] gabapentin  300 mg Oral QHS  . [MAR Hold] isosorbide mononitrate  30 mg Oral Daily  . [MAR Hold] latanoprost  1 drop Both Eyes QHS  . [MAR Hold] loratadine  10 mg Oral Daily  . [MAR Hold] omega-3 acid ethyl esters  1 g Oral Daily  . [MAR Hold] pantoprazole  40 mg Oral Daily  . [MAR Hold] polycarbophil  625 mg Oral Daily  . [MAR Hold] sodium chloride flush  3 mL Intravenous Q12H  . sodium chloride flush  3 mL Intravenous Q12H   Continuous Infusions: . [MAR Hold] sodium chloride    . sodium chloride    . sodium chloride 50 mL/hr at 11/06/17 0010   PRN Meds: [MAR Hold] sodium chloride, sodium chloride, [MAR Hold] acetaminophen, [MAR Hold] cloNIDine, [MAR Hold] fluticasone, [MAR Hold]  morphine injection, [MAR Hold] nitroGLYCERIN, [MAR Hold] ondansetron (ZOFRAN) IV, [MAR Hold] sodium chloride flush, sodium chloride flush   Vital Signs    Vitals:   11/05/17 1934 11/06/17 0319 11/06/17 0726 11/06/17 0805  BP: (!) 146/64 (!) 122/58 120/60 (!) 137/58  Pulse: (!) 56 (!) 54 (!) 51 (!) 55  Resp: 17 17 18 17   Temp: 98.2 F (36.8 C) 98.1 F (36.7 C) 98.3 F (36.8 C) 98.3 F (36.8 C)  TempSrc: Oral Oral Oral   SpO2: 99% 97% 94% 94%  Weight:    70.4 kg  Height:    5' (1.524 m)    Intake/Output Summary (Last 24 hours) at 11/06/2017 0834 Last  data filed at 11/06/2017 0800 Gross per 24 hour  Intake 141.15 ml  Output 1350 ml  Net -1208.85 ml   Filed Weights   11/05/17 1712 11/06/17 0805  Weight: 70.4 kg 70.4 kg    Telemetry    NSR and sinus bradycardia - Personally Reviewed  ECG    Sinus bradycardia - Personally Reviewed  Physical Exam   GEN: No acute distress.   Neck: No JVD Cardiac: Bradycardic but regular; no murmurs, rubs, or gallops.  Respiratory: Clear to auscultation bilaterally. GI: Soft, nontender, non-distended  MS: No edema; No deformity. Neuro:  Nonfocal  Psych: Normal affect   Labs    Chemistry Recent Labs  Lab 10/31/17 1028 11/03/17 1004 11/05/17 1918 11/06/17 0743  NA 139 138 138 138  K 3.5 4.0 3.0* 3.3*  CL 106 101 101 101  CO2 26 27 29 29   GLUCOSE 103* 97 109* 97  BUN 10 11 13 13   CREATININE 0.52 0.59 0.61 0.60  CALCIUM 8.8* 8.6* 8.6* 8.2*  PROT 6.2* 6.2* 6.5  --   ALBUMIN 3.7 3.8 3.8  --   AST 16 23 18   --   ALT 15 16 16   --   ALKPHOS 46 44 43  --  BILITOT 0.4 1.0 0.4  --   GFRNONAA >60 >60 >60 >60  GFRAA >60 >60 >60 >60  ANIONGAP 7 10 8 8      Hematology Recent Labs  Lab 11/05/17 1817 11/05/17 1918 11/06/17 0743  WBC 5.6 6.1 4.2  RBC 4.19 4.24 3.51*  HGB 11.0* 11.2* 9.3*  HCT 35.8* 36.7 30.2*  MCV 85.4 86.6 86.0  MCH 26.3 26.4 26.5  MCHC 30.7 30.5 30.8  RDW 16.0* 15.9* 16.0*  PLT 168 173 143*    Cardiac Enzymes Recent Labs  Lab 11/03/17 1004 11/05/17 1918 11/06/17 0127 11/06/17 0743  TROPONINI <0.03 <0.03 <0.03 <0.03   No results for input(s): TROPIPOC in the last 168 hours.   BNPNo results for input(s): BNP, PROBNP in the last 168 hours.   DDimer No results for input(s): DDIMER in the last 168 hours.   Radiology    No results found.  Cardiac Studies   Echocardiogram (11/05/17): - Left ventricle: The cavity size was normal. Wall thickness was   increased in a pattern of moderate LVH. Systolic function was   normal. The estimated ejection  fraction was in the range of 55%   to 60%. Wall motion was normal; there were no regional wall   motion abnormalities. Doppler parameters are consistent with   abnormal left ventricular relaxation (grade 1 diastolic   dysfunction). - Aortic valve: There was trivial regurgitation. - Left atrium: The atrium was mildly dilated. - Right ventricle: The cavity size was normal. Systolic function   was normal. - Pulmonary arteries: Systolic pressure was mildly increased, in   the range of 35 mm Hg to 40 mm Hg.  Patient Profile     72 y.o. female hypertension, hiatal hernia, GERD, prediabetes, anemia, and arthritis, directly admitted from the office due to worsening chest pain concerning for unstable angina, as well as labile blood pressure.  Assessment & Plan    Unstable angina Currently asymptomatic but reports of intermittent chest pain overnight.  Troponins have been negative this far.  Echo without wall-motion abnormality.  Plan for left heart catheterization and possible PCI today.  I have reviewed the risks, indications, and alternatives to cardiac catheterization, possible angioplasty, and stenting with the patient. Risks include but are not limited to bleeding, infection, vascular injury, stroke, myocardial infection, arrhythmia, kidney injury, radiation-related injury in the case of prolonged fluoroscopy use, emergency cardiac surgery, and death. The patient understands the risks of serious complication is 1-2 in 7253 with diagnostic cardiac cath and 1-2% or less with angioplasty/stenting.  Continue ASA, atenolol, amlodipine, and isosorbide mononitrate.  Continue high-intensity statin therapy.  Labile hypertension Blood pressure improved since admission.  Losartan/HCTZ currently on hold pending catheterization.  Plan for renal angiography at time of left heart catheterization, given difficult to control BP recently despite being on multiple agents.  Procedure, including risks and  benefits, were discussed with the patient.  She wishes to proceed.  Shortness of breath Improved.  Echo with mildly elevated PA pressures and grade 1 diastolic dysfunction.  Normal LVEF.  Catheterization today.  Further recommendations following cath.  For questions or updates, please contact [place] Please consult [email or web address] for contact info under [facility].    Signed, Nelva Bush, MD  11/06/2017, 8:34 AM

## 2017-11-06 NOTE — Interval H&P Note (Signed)
History and Physical Interval Note:  11/06/2017 8:33 AM  Ashley Ryan  has presented today for cardiac catheterization, with the diagnosis of unstable angina.  The various methods of treatment have been discussed with the patient and family. After consideration of risks, benefits and other options for treatment, the patient has consented to  Procedure(s): LEFT HEART CATH AND CORONARY ANGIOGRAPHY (N/A) as a surgical intervention .  The patient's history has been reviewed, patient examined, no change in status, stable for surgery.  I have reviewed the patient's chart and labs.  Questions were answered to the patient's satisfaction.    Cath Lab Visit (complete for each Cath Lab visit)  Clinical Evaluation Leading to the Procedure:   ACS: Yes.    Non-ACS:  N/A  Ashley Ryan

## 2017-11-07 ENCOUNTER — Telehealth: Payer: Self-pay | Admitting: *Deleted

## 2017-11-07 NOTE — Telephone Encounter (Signed)
Transition Care Management Follow-up Telephone Call   Date discharged? 11/06/2017   How have you been since you were released from the hospital? "a little weak and my BP is still up. It was 157/78 this am"   Do you understand why you were in the hospital? yes   Do you understand the discharge instructions? yes   Where were you discharged to? home   Items Reviewed:  Medications reviewed: yes. Hospital physician d/c sudafed and pt is wanting to know what she can take it is place  Allergies reviewed: yes  Dietary changes reviewed: yes  Referrals reviewed: yes   Functional Questionnaire:   Activities of Daily Living (ADLs):   She states they are independent in the following: independent in all areas    Any transportation issues/concerns?: no   Any patient concerns? Yes; allergies. Wanting to know what she can take in the place of sudafed    Confirmed importance and date/time of follow-up visits scheduled yes  Provider Appointment booked with  Dr Diona Browner 10/29 @ 1045  Confirmed with patient if condition begins to worsen call PCP or go to the ER.  Patient was given the office number and encouraged to call back with question or concerns.  : yes

## 2017-11-07 NOTE — Discharge Summary (Signed)
[facility] NAME: Ashley Ryan    MR#:  [id number]  DATE OF BIRTH:  Jun 14, 1945  DATE OF ADMISSION:  11/05/2017   ADMITTING PHYSICIAN: Demetrios Loll, MD  DATE OF DISCHARGE: 11/06/2017  3:53 PM  PRIMARY CARE PHYSICIAN: [name], MD   ADMISSION DIAGNOSIS:   unstable angina  DISCHARGE DIAGNOSIS:   Active Problems:   Ischemic chest pain (HCC)   Unstable angina ([place])   SECONDARY DIAGNOSIS:   Past Medical History:  Diagnosis Date  . Anemia   . Arthritis   . Chronic headache   . Complication of anesthesia    prior to 1991 used to have PONV.  none recently.  . Diverticulosis of colon   . Family history of adverse reaction to anesthesia    mother/daighter get sick  . GERD with stricture   . Hard of hearing   . Heart murmur    followed by PCP  . Hiatal hernia   . Hypertension   . Neuropathy    feet  . Osteoporosis   . Pre-diabetes   . Wears dentures    full upper    HOSPITAL COURSE:   72 year old female with past medical history significant for hypertension, hiatal hernia, GERD, anemia and arthritis presents to cardiology office secondary to worsening chest pain  1.  Chest pain-could be secondary to GI issues.  Initially admitted for possible unstable angina.  Also had elevated blood pressures. -Appreciate cardiology consult.  Patient had left heart catheterization done did not show any coronary artery disease and also had normal renals. - recommended further GI work up for chest pain - Imdur started  - no indication for antiplatelet therapy at this time.  2.  Hypertension-patient on Norvasc, atenolol, clonidine. -Blood pressure was elevated on admission.  Stable while here. -No renal artery stenosis noted on cardiac cath. -Cardiology recommended discharge on low-dose Lasix.  3.  History of breast cancer-status post left mastectomy -Continue outpatient follow-up.  Patient on tamoxifen  4.  B12  deficiency-on monthly B12 injections.  Also has iron deficiency anemia.  Continue follow-up with hematology  Patient is up and ambulatory.  Will be discharged home today  DISCHARGE CONDITIONS:   Guarded  CONSULTS OBTAINED:   Treatment Team:  Nelva Bush, MD  DRUG ALLERGIES:   Allergies  Allergen Reactions  . Sulfa Antibiotics Rash    Childhood reaction  . Sulfonamide Derivatives Rash    Childhood reaction.   DISCHARGE MEDICATIONS:   Allergies as of 11/06/2017      Reactions   Sulfa Antibiotics Rash   Childhood reaction   Sulfonamide Derivatives Rash   Childhood reaction.      Medication List    STOP taking these medications   phenylephrine 10 MG Tabs tablet Commonly known as:  SUDAFED PE   vitamin B-12 1000 MCG tablet Commonly known as:  CYANOCOBALAMIN     TAKE these medications   ACCU-CHEK AVIVA PLUS w/Device Kit Use to check blood sugar once daily   ACCU-CHEK AVIVA Soln Use to check blood sugar once daily   ACCU-CHEK SOFTCLIX LANCETS lancets Use to check blood sugar once daily   amLODipine 5 MG tablet Commonly known as:  NORVASC Take 1 tablet (5 mg total) by mouth daily.   aspirin EC 81 MG tablet Take 81 mg by mouth daily.   atenolol 100 MG tablet Commonly known as:  TENORMIN TAKE 1 TABLET EVERY DAY   atorvastatin 40 MG tablet  Commonly known as:  LIPITOR TAKE 1 TABLET EVERY DAY at bedtime What changed:    how much to take  how to take this  when to take this  additional instructions   B-D SINGLE USE SWABS REGULAR Pads Use to check blood sugar once daily   CALCIUM 600+D 600-400 MG-UNIT tablet Generic drug:  Calcium Carbonate-Vitamin D Take 1 tablet by mouth 2 (two) times daily.   cloNIDine 0.1 MG tablet Commonly known as:  CATAPRES Take 1 tablet (0.1 mg total) by mouth 2 (two) times daily as needed (Systolic blood pressure greater than 180).   esomeprazole 40 MG capsule Commonly known as:  NEXIUM TAKE 1 CAPSULE 2 (TWO)  TIMES DAILY BEFORE A MEAL. What changed:  See the new instructions.   Fish Oil 1000 MG Caps Take 1,000 mg by mouth daily.   FLUoxetine 20 MG capsule Commonly known as:  PROZAC TAKE 1 CAPSULE EVERY DAY   fluticasone 50 MCG/ACT nasal spray Commonly known as:  FLONASE Place 2 sprays into both nostrils daily as needed for allergies or rhinitis.   furosemide 20 MG tablet Commonly known as:  LASIX Take 1 tablet (20 mg total) by mouth daily. What changed:  additional instructions   gabapentin 100 MG capsule Commonly known as:  NEURONTIN TAKE 1 CAPSULE IN THE MORNING, 1 CAPSULE AT LUNCH, 1 CAPSULES AT DINNER AND 2 TO 3 CAPSULES AT BEDTIME What changed:    how much to take  how to take this  when to take this  additional instructions   Glucosamine 500 MG Tabs Take 500 mg by mouth daily.   glucose blood test strip Use to check blood sugar once daily   isosorbide mononitrate 30 MG 24 hr tablet Commonly known as:  IMDUR Take 1 tablet (30 mg total) by mouth daily.   latanoprost 0.005 % ophthalmic solution Commonly known as:  XALATAN Place 1 drop into both eyes at bedtime.   lidocaine 5 % Commonly known as:  LIDODERM APPLY 1 PATCH ONTO THE SKIN EVERY DAY. REMOVE AND DISCARD PATCH WITHIN 12 HOURS OR AS DIRECTED BY PHYSICIAN. What changed:  See the new instructions.   loratadine 10 MG tablet Commonly known as:  CLARITIN Take 10 mg by mouth daily.   losartan-hydrochlorothiazide 100-25 MG tablet Commonly known as:  HYZAAR Take 0.5 tablets by mouth daily. What changed:  how much to take   polycarbophil 625 MG tablet Commonly known as:  FIBERCON Take 625 mg by mouth daily.   potassium chloride 10 MEQ tablet Commonly known as:  K-DUR Take 1 tablet (10 mEq total) by mouth daily. While on lasix   tamoxifen 20 MG tablet Commonly known as:  NOLVADEX Take 1 tablet (20 mg total) by mouth daily.   traMADol 50 MG tablet Commonly known as:  ULTRAM Take 1 tablet (50 mg  total) by mouth every 6 (six) hours as needed. for pain What changed:    reasons to take this  additional instructions        DISCHARGE INSTRUCTIONS:   1.  PCP follow-up in 1 to 2 weeks 2.  Oncology follow-up as prior scheduled  DIET:   Cardiac diet  ACTIVITY:   Activity as tolerated  OXYGEN:   Home Oxygen: No.  Oxygen Delivery: room air  DISCHARGE LOCATION:   home   If you experience worsening of your admission symptoms, develop shortness of breath, life threatening emergency, suicidal or homicidal thoughts you must seek medical attention immediately by calling 911 or  calling your MD immediately  if symptoms less severe.  You Must read complete instructions/literature along with all the possible adverse reactions/side effects for all the Medicines you take and that have been prescribed to you. Take any new Medicines after you have completely understood and accpet all the possible adverse reactions/side effects.   Please note  You were cared for by a hospitalist during your hospital stay. If you have any questions about your discharge medications or the care you received while you were in the hospital after you are discharged, you can call the unit and asked to speak with the hospitalist on call if the hospitalist that took care of you is not available. Once you are discharged, your primary care physician will handle any further medical issues. Please note that NO REFILLS for any discharge medications will be authorized once you are discharged, as it is imperative that you return to your primary care physician (or establish a relationship with a primary care physician if you do not have one) for your aftercare needs so that they can reassess your need for medications and monitor your lab values.    On the day of Discharge:  VITAL SIGNS:   Blood pressure 128/64, pulse (!) 55, temperature 98.3 F (36.8 C), resp. rate 16, height 5' (1.524 m), weight 70.4 kg, SpO2 94  %.  PHYSICAL EXAMINATION:    GENERAL:  72 y.o.-year-old patient lying in the bed with no acute distress.  EYES: Pupils equal, round, reactive to light and accommodation. No scleral icterus. Extraocular muscles intact.  HEENT: Head atraumatic, normocephalic. Oropharynx and nasopharynx clear.  NECK:  Supple, no jugular venous distention. No thyroid enlargement, no tenderness.  LUNGS: Normal breath sounds bilaterally, no wheezing, rales,rhonchi or crepitation. No use of accessory muscles of respiration.  Decreased left basilar breath sounds. -Status post left mastectomy CARDIOVASCULAR: S1, S2 normal. No  rubs, or gallops.  3/6 systolic murmur present ABDOMEN: Soft, non-tender, non-distended. Bowel sounds present. No organomegaly or mass.  EXTREMITIES: No pedal edema, cyanosis, or clubbing.  Right groin dressing in place.  No active bleeding noted. NEUROLOGIC: Cranial nerves II through XII are intact. Muscle strength 5/5 in all extremities. Sensation intact. Gait not checked.  PSYCHIATRIC: The patient is alert and oriented x 3.  SKIN: No obvious rash, lesion, or ulcer.   DATA REVIEW:   CBC Recent Labs  Lab 11/06/17 0743  WBC 4.2  HGB 9.3*  HCT 30.2*  PLT 143*    Chemistries  Recent Labs  Lab 11/05/17 1918 11/06/17 0743  NA 138 138  K 3.0* 3.3*  CL 101 101  CO2 29 29  GLUCOSE 109* 97  BUN 13 13  CREATININE 0.61 0.60  CALCIUM 8.6* 8.2*  AST 18  --   ALT 16  --   ALKPHOS 43  --   BILITOT 0.4  --      Microbiology Results  No results found for this or any previous visit.  RADIOLOGY:  No results found.   Management plans discussed with the patient, family and they are in agreement.  CODE STATUS:  Code Status History    Date Active Date Inactive Code Status Order ID Comments User Context   11/05/2017 1847 11/06/2017 1858 Full Code [id number]  Saundra Shelling, MD Inpatient      TOTAL TIME TAKING CARE OF THIS PATIENT: 38 minutes.    Gladstone Lighter M.D on  11/07/2017 at 4:19 PM  Between 7am to 6pm - Pager - [phone]  After 6pm go  to [email or web address] - Proofreader  [facility]  Office  [phone]  CC: Primary care physician; [name], MD   Note: This dictation was prepared with Dragon dictation along with smaller phrase technology. Any transcriptional errors that result from this process are unintentional.

## 2017-11-07 NOTE — Telephone Encounter (Signed)
Can use mucinex instead of sudafed... Follow BPs and call with measurements if > 140/90 in next week.

## 2017-11-07 NOTE — Telephone Encounter (Signed)
Ashley Ryan notified as instructed by telephone.  She states that she took her BP again at around 1:00 pm and it was 116/59.

## 2017-11-12 ENCOUNTER — Inpatient Hospital Stay: Payer: Medicare HMO | Admitting: Family Medicine

## 2017-11-13 ENCOUNTER — Other Ambulatory Visit: Payer: Self-pay | Admitting: Family Medicine

## 2017-11-13 NOTE — Telephone Encounter (Signed)
Last office visit 11/04/2017 for chest pain.  Last refilled 08/08/2017 for #90 with no refills. Hospital follow up scheduled 11/18/2017.  Ok to refill?

## 2017-11-18 ENCOUNTER — Ambulatory Visit (INDEPENDENT_AMBULATORY_CARE_PROVIDER_SITE_OTHER): Payer: Medicare HMO | Admitting: Family Medicine

## 2017-11-18 ENCOUNTER — Encounter: Payer: Self-pay | Admitting: Family Medicine

## 2017-11-18 VITALS — BP 100/60 | HR 58 | Temp 97.8°F | Ht 61.0 in | Wt 150.2 lb

## 2017-11-18 DIAGNOSIS — K219 Gastro-esophageal reflux disease without esophagitis: Secondary | ICD-10-CM

## 2017-11-18 DIAGNOSIS — R079 Chest pain, unspecified: Secondary | ICD-10-CM

## 2017-11-18 DIAGNOSIS — R0602 Shortness of breath: Secondary | ICD-10-CM | POA: Diagnosis not present

## 2017-11-18 DIAGNOSIS — I10 Essential (primary) hypertension: Secondary | ICD-10-CM

## 2017-11-18 LAB — BASIC METABOLIC PANEL
BUN: 21 mg/dL (ref 6–23)
CHLORIDE: 99 meq/L (ref 96–112)
CO2: 28 meq/L (ref 19–32)
CREATININE: 1 mg/dL (ref 0.40–1.20)
Calcium: 8.9 mg/dL (ref 8.4–10.5)
GFR: 57.53 mL/min — ABNORMAL LOW (ref >60.00)
Glucose, Bld: 96 mg/dL (ref 70–99)
Potassium: 3.5 mEq/L (ref 3.5–5.1)
Sodium: 135 mEq/L (ref 135–145)

## 2017-11-18 NOTE — Progress Notes (Signed)
   Subjective:    Patient ID: Ashley Ryan, female    DOB: January 20, 1946, 72 y.o.   MRN: [id number]  HPI  72 year old female presents for hospital follow up. Admitted on 10/16 for unstable angina and chest pain from cardiology office Discharged on 10/17 left heart catheterization done did not show any coronary artery disease and also had normal renals. - recommended further GI work up for chest pain - Imdur started   She is on nexium for GERD.  BP much improved now on new regimen of lasix low dose, norvasc, atenolol and clonidine.  Today she reports: BPs running very low, lightheaded. She is not taking the clonidine and she is using lasix PRN in last few days, but BPs still running low.  94-104/57-59 HR 60s  She lost 12 lbs of fluid with lasix, legs no longer swollen.  No chest pain but still SOB with exertion,.   GERD well controlled with nexium.  Social History /Family History/Past Medical History reviewed in detail and updated in EMR if needed. Blood pressure 100/60, pulse (!) 58, temperature 97.8 F (36.6 C), temperature source Oral, height 5\' 1"  (1.549 m), weight 150 lb 4 oz (68.2 kg).  Review of Systems  Constitutional: Negative for fatigue and fever.  HENT: Negative for congestion.   Eyes: Negative for pain.  Respiratory: Positive for shortness of breath. Negative for cough.   Cardiovascular: Negative for chest pain, palpitations and leg swelling.  Gastrointestinal: Negative for abdominal pain.  Genitourinary: Negative for dysuria and vaginal bleeding.  Musculoskeletal: Negative for back pain.  Neurological: Negative for syncope, light-headedness and headaches.  Psychiatric/Behavioral: Negative for dysphoric mood.       Objective:   Physical Exam  Constitutional: Vital signs are normal. She appears well-developed and well-nourished. She is cooperative.  Non-toxic appearance. She does not appear ill. No distress.  HENT:  Head: Normocephalic.  Right Ear: Hearing,  tympanic membrane, external ear and ear canal normal. Tympanic membrane is not erythematous, not retracted and not bulging.  Left Ear: Hearing, tympanic membrane, external ear and ear canal normal. Tympanic membrane is not erythematous, not retracted and not bulging.  Nose: No mucosal edema or rhinorrhea. Right sinus exhibits no maxillary sinus tenderness and no frontal sinus tenderness. Left sinus exhibits no maxillary sinus tenderness and no frontal sinus tenderness.  Mouth/Throat: Uvula is midline, oropharynx is clear and moist and mucous membranes are normal.  Eyes: Pupils are equal, round, and reactive to light. Conjunctivae, EOM and lids are normal. Lids are everted and swept, no foreign bodies found.  Neck: Trachea normal and normal range of motion. Neck supple. Carotid bruit is not present. No thyroid mass and no thyromegaly present.  Cardiovascular: Normal rate, regular rhythm, S1 normal, S2 normal, normal heart sounds, intact distal pulses and normal pulses. Exam reveals no gallop and no friction rub.  No murmur heard. Pulmonary/Chest: Effort normal and breath sounds normal. No tachypnea. No respiratory distress. She has no decreased breath sounds. She has no wheezes. She has no rhonchi. She has no rales.  Abdominal: Soft. Normal appearance and bowel sounds are normal. There is no tenderness.  Neurological: She is alert.  Skin: Skin is warm, dry and intact. No rash noted.  Psychiatric: Her speech is normal and behavior is normal. Judgment and thought content normal. Her mood appears not anxious. Cognition and memory are normal. She does not exhibit a depressed mood.          Assessment & Plan:

## 2017-11-18 NOTE — Assessment & Plan Note (Signed)
On imdur. Feeling much better after diuresis with lasix. No on prn lasix.  Keep 2 week follow up with cardiology.

## 2017-11-18 NOTE — Assessment & Plan Note (Signed)
Continue amlodipine, decrease atenolol to 50 mg. Use lasix prn.  Check BMET today to make sure not over diuresed.

## 2017-11-18 NOTE — Patient Instructions (Addendum)
Please stop at the lab to have labs drawn.  Use lasix as needed.  Decrease atenolol to 50 mg daily ( can cut 100 mg in half) Follow BP at home.  Keep follow up in 2 weeks with cardiology. Keep appointment with Benita Stabile for AMW...  Cancel CPX with me and change to follow up in 3 months for HTN, DM.

## 2017-11-21 ENCOUNTER — Other Ambulatory Visit: Payer: Self-pay | Admitting: Family Medicine

## 2017-11-25 ENCOUNTER — Telehealth: Payer: Self-pay | Admitting: Family Medicine

## 2017-11-25 NOTE — Telephone Encounter (Signed)
-----   Message from Eustace Pen, LPN sent at 37/01/624  2:51 PM EST ----- Regarding: Labs 11/6 Lab orders needed. Thank you.  Insurance:  [employer]  If none, please advise. Thank you.

## 2017-11-26 ENCOUNTER — Ambulatory Visit: Payer: Medicare HMO

## 2017-11-28 ENCOUNTER — Encounter: Payer: Medicare HMO | Admitting: Family Medicine

## 2017-11-28 ENCOUNTER — Inpatient Hospital Stay: Payer: Medicare HMO | Attending: Hematology and Oncology

## 2017-11-28 DIAGNOSIS — E538 Deficiency of other specified B group vitamins: Secondary | ICD-10-CM | POA: Diagnosis not present

## 2017-11-28 MED ORDER — CYANOCOBALAMIN 1000 MCG/ML IJ SOLN
1000.0000 ug | Freq: Once | INTRAMUSCULAR | Status: AC
  Administered 2017-11-28: 1000 ug via INTRAMUSCULAR

## 2017-12-01 ENCOUNTER — Telehealth: Payer: Self-pay | Admitting: Family Medicine

## 2017-12-01 DIAGNOSIS — E7800 Pure hypercholesterolemia, unspecified: Secondary | ICD-10-CM

## 2017-12-01 DIAGNOSIS — E538 Deficiency of other specified B group vitamins: Secondary | ICD-10-CM

## 2017-12-01 DIAGNOSIS — D509 Iron deficiency anemia, unspecified: Secondary | ICD-10-CM

## 2017-12-01 NOTE — Telephone Encounter (Signed)
-----   Message from Eustace Pen, LPN sent at 63/0/1601  2:19 PM EST ----- Regarding: Labs 11/12 Lab orders needed. Thank you.  Insurance:  [employer]

## 2017-12-01 NOTE — Assessment & Plan Note (Signed)
Inadequate control on current regimen.

## 2017-12-01 NOTE — Telephone Encounter (Signed)
No labs needed

## 2017-12-01 NOTE — Assessment & Plan Note (Signed)
Worrisome for angina. Continue aspirin and refer to cardiology for consideration for catheterization vs stress test.

## 2017-12-02 ENCOUNTER — Ambulatory Visit: Payer: Medicare HMO

## 2017-12-04 ENCOUNTER — Ambulatory Visit: Payer: Medicare HMO | Admitting: Nurse Practitioner

## 2017-12-22 ENCOUNTER — Other Ambulatory Visit: Payer: Self-pay

## 2017-12-22 MED ORDER — FUROSEMIDE 20 MG PO TABS: 20.0000 mg | ORAL_TABLET | Freq: Every day | ORAL | 1 refills | Status: DC | PRN

## 2017-12-22 MED ORDER — ISOSORBIDE MONONITRATE ER 30 MG PO TB24: 30.0000 mg | ORAL_TABLET | Freq: Every day | ORAL | 1 refills | Status: DC

## 2017-12-22 MED ORDER — POTASSIUM CHLORIDE ER 10 MEQ PO TBCR: 10.0000 meq | EXTENDED_RELEASE_TABLET | Freq: Every day | ORAL | 1 refills | Status: DC

## 2017-12-22 NOTE — Telephone Encounter (Signed)
Pt requesting refills on K last filled # 30 x 1 on 11/06/17, isobid # 30 x 1 on 11/06/17 and lasix # 30 x 1 on 11/06/17 which were filled by hospitalist. Pt had [facility] on 11/18/17. Pt cancelled card appt and will call card to reschedule appt. Pt is out of isobid. Pt has 3 mth FU appt on 02/20/18. Please advise.pt is out of isobid. [facility].

## 2017-12-29 ENCOUNTER — Inpatient Hospital Stay: Payer: Medicare HMO | Attending: Hematology and Oncology

## 2017-12-29 DIAGNOSIS — E538 Deficiency of other specified B group vitamins: Secondary | ICD-10-CM | POA: Insufficient documentation

## 2017-12-29 MED ORDER — CYANOCOBALAMIN 1000 MCG/ML IJ SOLN
1000.0000 ug | Freq: Once | INTRAMUSCULAR | Status: AC
  Administered 2017-12-29: 1000 ug via INTRAMUSCULAR
  Filled 2017-12-29: qty 1

## 2018-01-14 ENCOUNTER — Other Ambulatory Visit: Payer: Self-pay | Admitting: Family Medicine

## 2018-01-17 ENCOUNTER — Other Ambulatory Visit: Payer: Self-pay | Admitting: Family Medicine

## 2018-01-26 ENCOUNTER — Inpatient Hospital Stay: Payer: Medicare HMO | Attending: Hematology and Oncology

## 2018-01-26 ENCOUNTER — Other Ambulatory Visit: Payer: Self-pay | Admitting: *Deleted

## 2018-01-26 DIAGNOSIS — E538 Deficiency of other specified B group vitamins: Secondary | ICD-10-CM | POA: Insufficient documentation

## 2018-01-26 MED ORDER — CYANOCOBALAMIN 1000 MCG/ML IJ SOLN
1000.0000 ug | Freq: Once | INTRAMUSCULAR | Status: AC
  Administered 2018-01-26: 1000 ug via INTRAMUSCULAR

## 2018-01-26 MED ORDER — ISOSORBIDE MONONITRATE ER 30 MG PO TB24: 30.0000 mg | ORAL_TABLET | Freq: Every day | ORAL | 0 refills | Status: DC

## 2018-01-27 ENCOUNTER — Ambulatory Visit: Payer: Medicare HMO | Admitting: General Surgery

## 2018-01-30 ENCOUNTER — Telehealth: Payer: Self-pay | Admitting: *Deleted

## 2018-01-30 NOTE — Telephone Encounter (Signed)
Received fax from [facility] for diabetic testing supplies.  I spoke with Ashley Ryan and she did request supplies from this company.  Form completed and faxed back at [phone].

## 2018-02-09 ENCOUNTER — Telehealth: Payer: Self-pay | Admitting: Cardiovascular Disease

## 2018-02-09 ENCOUNTER — Other Ambulatory Visit: Payer: Self-pay | Admitting: Family Medicine

## 2018-02-09 NOTE — Telephone Encounter (Signed)
Last office visit 11/18/2017 for hospital follow up.  Last refilled Tramadol 11/14/2017 for #90 with no refills.  Lidoderm 08/14/2017 for #90 with no refills.  Next Appt: 02/20/2018 for 3 month follow up.

## 2018-02-09 NOTE — Telephone Encounter (Signed)
Pt c/o BP issue: STAT if pt c/o blurred vision, one-sided weakness or slurred speech  1. What are your last 5 BP readings?  167/75 175/83  2. Are you having any other symptoms (ex. Dizziness, headache, blurred vision, passed out)? Headache, not much dizziness  3. What is your BP issue? Blood pressure has been high, would like to discuss with nurse.  Please call.

## 2018-02-10 NOTE — Telephone Encounter (Signed)
Please have Ashley Ryan increase amlodipine to 10 mg daily and continue to monitor her BP regularly at home.  If she develops worsening LE edema, she should let us know.

## 2018-02-10 NOTE — Telephone Encounter (Signed)
Spoke with patient.  States her BP was running high yesterday.  It was 180/78, HR 55 and 179/80, HR 58. States she take her BP in the morning when she take her blood pressure pills.  This morning BP, 175/77, HR 61 at the time she took her BP medications. She took clonidine 0.1mg  this morning around 0800. She took it with me on the phone and it was 152/71, HR 59. I confirmed her cardiac medications and she is taking as prescribed on her list.  Denies headache, shortness of breath, or chest pain. She does say her left chest hurts when she presses down on it but she had her breast removed this past May and its probably from that. Says he vision seems a little blurry this morning which happens occasionally. She has glaucoma. She denies eating any meals out or with lots of salt.   Advised patient to monitor BP/HR by taking about 2 hours after her morning medications instead of right when she takes her medications. She will continue to monitor over the next few days and plan to call us back on Friday with the readings.  She verbalized understanding. Routing to Dr End for review.

## 2018-02-11 MED ORDER — AMLODIPINE BESYLATE 5 MG PO TABS: 5.0000 mg | ORAL_TABLET | Freq: Every day | ORAL | 3 refills | Status: DC

## 2018-02-11 MED ORDER — AMLODIPINE BESYLATE 10 MG PO TABS: 10.0000 mg | ORAL_TABLET | Freq: Every day | ORAL | 3 refills | Status: DC

## 2018-02-11 NOTE — Telephone Encounter (Signed)
Called patient and she verbalized understanding to increase amlodipine to 10 mg daily and continue to monitor BP and swelling. Patient states her swelling is already gone. Her BP last night at 11pm - 139/51, HR 51. Today, 2 hours after medications BP was 146/65, HR 55. Rx sent to [facility] for patient.

## 2018-02-11 NOTE — Telephone Encounter (Signed)
Patient calling back. She states she does not have amlodipine with her medications. She thinks she has not been taking it for at least 2 weeks and maybe a month. Doesn't know if she accidentally threw it away or what. Advised patient to hold off on increasing amlodipine at this time and let me verify with Dr End next steps. She was appreciative.

## 2018-02-11 NOTE — Telephone Encounter (Signed)
If she has been off amlodipine, I recommend restarting at 5 mg daily.  Thanks.  Nelva Bush, MD [facility] Pager: [phone]

## 2018-02-11 NOTE — Telephone Encounter (Signed)
Called patient and she verbalized understanding to resume amlodipine 5 mg once a day. [facility]. She will continue to monitor and let us know if BP consistently greater than 140/90.

## 2018-02-20 ENCOUNTER — Other Ambulatory Visit: Payer: Self-pay

## 2018-02-20 ENCOUNTER — Ambulatory Visit (INDEPENDENT_AMBULATORY_CARE_PROVIDER_SITE_OTHER): Payer: Medicare HMO | Admitting: Family Medicine

## 2018-02-20 VITALS — BP 122/70 | HR 60 | Temp 98.0°F | Ht 61.0 in | Wt 147.2 lb

## 2018-02-20 DIAGNOSIS — E7800 Pure hypercholesterolemia, unspecified: Secondary | ICD-10-CM

## 2018-02-20 DIAGNOSIS — E119 Type 2 diabetes mellitus without complications: Secondary | ICD-10-CM

## 2018-02-20 DIAGNOSIS — I10 Essential (primary) hypertension: Secondary | ICD-10-CM | POA: Diagnosis not present

## 2018-02-20 DIAGNOSIS — E1142 Type 2 diabetes mellitus with diabetic polyneuropathy: Secondary | ICD-10-CM | POA: Diagnosis not present

## 2018-02-20 DIAGNOSIS — E1140 Type 2 diabetes mellitus with diabetic neuropathy, unspecified: Secondary | ICD-10-CM | POA: Diagnosis not present

## 2018-02-20 MED ORDER — BD SWAB SINGLE USE REGULAR PADS: MEDICATED_PAD | 3 refills | Status: DC

## 2018-02-20 MED ORDER — ACCU-CHEK SOFTCLIX LANCETS MISC: 3 refills | Status: DC

## 2018-02-20 MED ORDER — ACCU-CHEK AVIVA VI SOLN: 0 refills | Status: DC

## 2018-02-20 MED ORDER — ACCU-CHEK AVIVA PLUS W/DEVICE KIT: PACK | 0 refills | Status: DC

## 2018-02-20 MED ORDER — GLUCOSE BLOOD VI STRP: ORAL_STRIP | 3 refills | Status: DC

## 2018-02-20 NOTE — Patient Instructions (Addendum)
MAke an appt for FASTING labs only visit early next next week.  Cut back on sweet tea and soda. Fasting blood sugar  Goal 80-120 2 hours after a meal goal < 180

## 2018-02-20 NOTE — Assessment & Plan Note (Signed)
Well controlled. Continue current medication. Encouraged exercise, weight loss, healthy eating habits.  

## 2018-02-20 NOTE — Telephone Encounter (Signed)
Patient requesting all diabetes testing supplies to be sent in to her mail order, [facility].   We haven't ordered supplies since 2016.  LM for patient to call back and confirm what meter she is using and what type of testing supplies she is needing.

## 2018-02-20 NOTE — Assessment & Plan Note (Signed)
Stable on gabapentin.   

## 2018-02-20 NOTE — Addendum Note (Signed)
Addended by: Carter Kitten on: 02/20/2018 04:52 PM   Modules accepted: Orders

## 2018-02-20 NOTE — Progress Notes (Signed)
Subjective:    Patient ID: Ashley Ryan, female    DOB: 1945/09/28, 73 y.o.   MRN: [id number]  HPI  73 year old female presents for follow up.  Hypertension:   Improved back on amlodipine   BP Readings from Last 3 Encounters:  02/20/18 122/70  11/18/17 100/60  11/06/17 128/64  Using medication without problems or lightheadedness: none Chest pain with exertion:none Edema:none Short of breath:none Average home BPs: Other issues:  Fluid status:  Euvolemic. On lasix prn.  Diabetes:    Due for re-eval. On no meds. Lab Results  Component Value Date   HGBA1C 6.0 11/12/2016  Using medications without difficulties: Hypoglycemic episodes:none Hyperglycemic episodes: none Feet problems: peripheral neuropathy.. gabapentin helping some Blood Sugars averaging: FBS 147 eye exam within last year:  She is drinking soda a lot... has changed to diet   Body mass index is 27.82 kg/m.  Wt Readings from Last 3 Encounters:  02/20/18 147 lb 4 oz (66.8 kg)  11/18/17 150 lb 4 oz (68.2 kg)  11/06/17 155 lb 3.3 oz (70.4 kg)     Social History /Family History/Past Medical History reviewed in detail and updated in EMR if needed. Blood pressure 122/70, pulse 60, temperature 98 F (36.7 C), temperature source Oral, height 5\' 1"  (1.549 m), weight 147 lb 4 oz (66.8 kg).  Review of Systems  Constitutional: Negative for fatigue and fever.  HENT: Negative for congestion.   Eyes: Negative for pain.  Respiratory: Negative for cough and shortness of breath.   Cardiovascular: Negative for chest pain, palpitations and leg swelling.  Gastrointestinal: Negative for abdominal pain.  Genitourinary: Negative for dysuria and vaginal bleeding.  Musculoskeletal: Negative for back pain.  Neurological: Negative for syncope, light-headedness and headaches.  Psychiatric/Behavioral: Negative for dysphoric mood.       Objective:   Physical Exam Constitutional:      General: She is not in acute distress.   Appearance: Normal appearance. She is well-developed. She is not ill-appearing or toxic-appearing.  HENT:     Head: Normocephalic.     Right Ear: Hearing, tympanic membrane, ear canal and external ear normal. Tympanic membrane is not erythematous, retracted or bulging.     Left Ear: Hearing, tympanic membrane, ear canal and external ear normal. Tympanic membrane is not erythematous, retracted or bulging.     Nose: No mucosal edema or rhinorrhea.     Right Sinus: No maxillary sinus tenderness or frontal sinus tenderness.     Left Sinus: No maxillary sinus tenderness or frontal sinus tenderness.     Mouth/Throat:     Pharynx: Uvula midline.  Eyes:     General: Lids are normal. Lids are everted, no foreign bodies appreciated.     Conjunctiva/sclera: Conjunctivae normal.     Pupils: Pupils are equal, round, and reactive to light.  Neck:     Musculoskeletal: Normal range of motion and neck supple.     Thyroid: No thyroid mass or thyromegaly.     Vascular: No carotid bruit.     Trachea: Trachea normal.  Cardiovascular:     Rate and Rhythm: Normal rate and regular rhythm.     Pulses: Normal pulses.     Heart sounds: Normal heart sounds, S1 normal and S2 normal. No murmur. No friction rub. No gallop.   Pulmonary:     Effort: Pulmonary effort is normal. No tachypnea or respiratory distress.     Breath sounds: Normal breath sounds. No decreased breath sounds, wheezing, rhonchi or rales.  Abdominal:     General: Bowel sounds are normal.     Palpations: Abdomen is soft.     Tenderness: There is no abdominal tenderness.  Skin:    General: Skin is warm and dry.     Findings: No rash.  Neurological:     Mental Status: She is alert.  Psychiatric:        Mood and Affect: Mood is not anxious or depressed.        Speech: Speech normal.        Behavior: Behavior normal. Behavior is cooperative.        Thought Content: Thought content normal.        Judgment: Judgment normal.     Diabetic  foot exam: Normal inspection No skin breakdown No calluses  Normal DP pulses decreased sensation to light touch and monofilament Nails normal     Assessment & Plan:

## 2018-02-20 NOTE — Assessment & Plan Note (Signed)
Due for re-eval. 

## 2018-02-20 NOTE — Assessment & Plan Note (Signed)
Worsened control. Encouraged dietary change.. stop soda and sweet tea. Send for A1C but likely high and plan lifestyle changes and repeat in 3 months.

## 2018-02-21 ENCOUNTER — Other Ambulatory Visit: Payer: Self-pay | Admitting: Family Medicine

## 2018-02-23 ENCOUNTER — Other Ambulatory Visit (INDEPENDENT_AMBULATORY_CARE_PROVIDER_SITE_OTHER): Payer: Medicare HMO

## 2018-02-23 DIAGNOSIS — E1140 Type 2 diabetes mellitus with diabetic neuropathy, unspecified: Secondary | ICD-10-CM

## 2018-02-23 LAB — COMPREHENSIVE METABOLIC PANEL
ALT: 13 U/L (ref 0–35)
AST: 11 U/L (ref 0–37)
Albumin: 3.8 g/dL (ref 3.5–5.2)
Alkaline Phosphatase: 50 U/L (ref 39–117)
BUN: 14 mg/dL (ref 6–23)
CO2: 28 meq/L (ref 19–32)
Calcium: 9.2 mg/dL (ref 8.4–10.5)
Chloride: 101 mEq/L (ref 96–112)
Creatinine, Ser: 0.76 mg/dL (ref 0.40–1.20)
GFR: 74.24 mL/min (ref >60.00)
GLUCOSE: 102 mg/dL — AB (ref 70–99)
Potassium: 3.2 mEq/L — ABNORMAL LOW (ref 3.5–5.1)
SODIUM: 138 meq/L (ref 135–145)
Total Bilirubin: 0.3 mg/dL (ref 0.2–1.2)
Total Protein: 6.1 g/dL (ref 6.0–8.3)

## 2018-02-23 LAB — LIPID PANEL
CHOLESTEROL: 91 mg/dL (ref 0–200)
HDL: 40.6 mg/dL (ref >39.00)
LDL CALC: 33 mg/dL (ref 0–99)
NonHDL: 50.41
TRIGLYCERIDES: 85 mg/dL (ref 0.0–149.0)
Total CHOL/HDL Ratio: 2
VLDL: 17 mg/dL (ref 0.0–40.0)

## 2018-02-23 LAB — HEMOGLOBIN A1C: Hgb A1c MFr Bld: 6 % (ref 4.6–6.5)

## 2018-02-26 ENCOUNTER — Ambulatory Visit: Payer: Medicare HMO | Admitting: General Surgery

## 2018-02-27 NOTE — Progress Notes (Signed)
Cardiology Office Note Date:  03/02/2018  Patient ID:  Ashley Ryan [birth year], MRN [id number] PCP:  [name], MD  Cardiologist:  Dr. Saunders Revel, MD   Chief Complaint: Follow-up  History of Present Illness: Ashley Ryan is a 73 y.o. female with history of nonobstructive CAD by [place] in 10/2017, HFpEF, pulmonary hypertension, prediabetes, anemia, HTN, hiatal hernia, GERD, and arthritis who presents for follow-up of her hypertension.  Patient was seen by Dr. Saunders Revel on 11/05/2017 for evaluation of chest pain and elevated BP. It was noted at that time she had recently been seen in the ED with BP readings in the 676-195 mmHg systolic range with associated chest tightness. She reported a remote cath 5-10 years prior that did not show any significant disease, per her report. BP was 138/70. She was noted to have ongoing chest pain at her visit with [place] on 11/05/2017 and was admitted from the office and ruled out. She underwent diagnostic LHC on 11/06/2017 that showed no angiographically significant CAD with mildly elevated LV filling pressures, consistent with diastolic dysfunction. She also underwent renal angiography which showed normal renal arteries. Echo on 11/06/2017 showed an EF of 55-60%, moderate LVH, no RWMA, Gr1DD, trivial AI, mildly dilated left atrium, RV cavity size was normal with normal RVSF, PASP 35-40 mmHg.   Since then, she has been seen by her PCP with BP ranging from 100-122/60-70. She called our office in late 01/2018 noting her BP had been running high in the 093-267 systolic range. It was noted she had not been taking amlodipine for 2-4 weeks for unclear reasons. In this setting, she was restarted on amlodipine 5 mg daily and follow up appointment was made. Patient was most recently seen by PCP on 02/20/2018 with a BP of 122/70. Medication list showed antihypertensives of: amlodipine 5 mg daily, atenolol 50 mg daily, clonidine 0.1 mg bid prn SBP > 180 mmHg, Lasix 20 mg daily prn, Imdur  30 mg daily, Hyzaar 100/25 mg daily.  Labs: 02/2018 - potassium 3.2, serum creatinine 0.76, LFT normal, A1c 6.0, LDL 33 10/2017 - Hgb 9.3 (baseline approximately 9.5-11.5)  She comes in doing well today.  She notes her blood pressures overall are significantly improved with readings typically in the 120s over 60s.  She has had one isolated reading in the 124P systolic.  She is compliant with all medications.  She has noted a rare intermittent left lateral chest wall pain at the site of her prior breast surgery.  No dizziness, presyncope, or syncope.  No shortness of breath or palpitations.   Past Medical History:  Diagnosis Date  . Anemia   . Arthritis   . Chronic headache   . Complication of anesthesia    prior to 1991 used to have PONV.  none recently.  . Diverticulosis of colon   . Family history of adverse reaction to anesthesia    mother/daighter get sick  . GERD with stricture   . Hard of hearing   . Heart murmur    followed by PCP  . Hiatal hernia   . Hypertension   . Neuropathy    feet  . Osteoporosis   . Pre-diabetes   . Wears dentures    full upper    Past Surgical History:  Procedure Laterality Date  . ABDOMINAL HYSTERECTOMY  1978   one ovary remains  . BREAST BIOPSY Left 05/26/2017   Affirm Bx- path pending - coil clip  . CARDIAC CATHETERIZATION     "  yrs ago" all OK.  Marland Kitchen CATARACT EXTRACTION W/PHACO Left 02/21/2016   Procedure: CATARACT EXTRACTION PHACO AND INTRAOCULAR LENS PLACEMENT ([place])  Left eye;  Surgeon: Leandrew Koyanagi, MD;  Location: [facility];  Service: Ophthalmology;  Laterality: Left;  Left eye Diabetic  . CATARACT EXTRACTION W/PHACO Right 03/13/2016   Procedure: CATARACT EXTRACTION PHACO AND INTRAOCULAR LENS PLACEMENT ([facility])  right  diabetic;  Surgeon: Leandrew Koyanagi, MD;  Location: [facility];  Service: Ophthalmology;  Laterality: Right;  diabetic  . CHOLECYSTECTOMY  1978  . LEFT HEART CATH AND CORONARY ANGIOGRAPHY N/A  11/06/2017   Procedure: LEFT HEART CATH AND CORONARY ANGIOGRAPHY;  Surgeon: Nelva Bush, MD;  Location: [facility];  Service: Cardiovascular;  Laterality: N/A;  . OOPHORECTOMY     one still left  . RENAL ANGIOGRAPHY  11/06/2017   Procedure: RENAL ANGIOGRAPHY;  Surgeon: Nelva Bush, MD;  Location: [facility];  Service: Cardiovascular;;  . SENTINEL NODE BIOPSY Left 06/13/2017   Procedure: SENTINEL NODE BIOPSY;  Surgeon: Robert Bellow, MD;  Location: [facility];  Service: General;  Laterality: Left;  . 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  Surgeon: Robert Bellow, MD;  Location: [facility];  Service: General;  Laterality: Left;    Current Meds  Medication Sig  . ACCU-CHEK SOFTCLIX LANCETS lancets Use to check blood sugar once daily  . Alcohol Swabs (B-D SINGLE USE SWABS REGULAR) PADS Use to check blood sugar once daily  . amLODipine (NORVASC) 5 MG tablet Take 1 tablet (5 mg total) by mouth daily.  Marland Kitchen aspirin EC 81 MG tablet Take 81 mg by mouth daily.   Marland Kitchen atenolol (TENORMIN) 25 MG tablet Take 1 tablet (25 mg total) by mouth daily.  Marland Kitchen atorvastatin (LIPITOR) 40 MG tablet TAKE 1 TABLET AT BEDTIME  . Blood Glucose Calibration (ACCU-CHEK AVIVA) SOLN Use to check blood sugar once daily  . Blood Glucose Monitoring Suppl (ACCU-CHEK AVIVA PLUS) w/Device KIT Use to check blood sugar once daily  . Calcium Carbonate-Vitamin D (CALCIUM 600+D) 600-400 MG-UNIT per tablet Take 1 tablet by mouth 2 (two) times daily.   . cloNIDine (CATAPRES) 0.1 MG tablet Take 1 tablet (0.1 mg total) by mouth 2 (two) times daily as needed (Systolic blood pressure greater than 180).  Marland Kitchen esomeprazole (NEXIUM) 40 MG capsule TAKE 1 CAPSULE 2 (TWO) TIMES DAILY BEFORE A MEAL.  Marland Kitchen FLUoxetine (PROZAC) 20 MG capsule TAKE 1 CAPSULE EVERY DAY  . fluticasone (FLONASE) 50 MCG/ACT nasal spray PLACE 2 SPRAYS INTO THE NOSE DAILY AS  DIRECTED (SUBSTITUTED FOR FLONASE)  . furosemide (LASIX) 20 MG tablet TAKE 1 TABLET BY MOUTH EVERY DAY AS NEEDED  . gabapentin (NEURONTIN) 100 MG capsule TAKE 1 CAPSULE IN THE MORNING, 1 CAPSULE AT LUNCH, 1 CAPSULES AT DINNER AND 2 TO 3 CAPSULES AT BEDTIME (Patient taking differently: Take 100-300 mg by mouth See admin instructions. 100 mg daily with breakfast, 100 mg daily with dinner, and 300 mg daily at bedtime)  . Glucosamine 500 MG TABS Take 500 mg by mouth daily.   Marland Kitchen glucose blood (ACCU-CHEK AVIVA) test strip Use to check blood sugar once daily  . isosorbide mononitrate (IMDUR) 30 MG 24 hr tablet TAKE 1 TABLET BY MOUTH EVERY DAY  . latanoprost (XALATAN) 0.005 % ophthalmic solution Place 1 drop into both eyes at bedtime.  . lidocaine (LIDODERM) 5 % APPLY 1 PATCH ONTO THE SKIN EVERY  DAY. REMOVE AND DISCARD PATCH WITHIN 12 HOURS OR AS DIRECTED BY PHYSICIAN.  Marland Kitchen loratadine (CLARITIN) 10 MG tablet Take 10 mg by mouth daily.  Marland Kitchen losartan-hydrochlorothiazide (HYZAAR) 100-25 MG tablet TAKE 1 TABLET EVERY DAY  . Omega-3 Fatty Acids (FISH OIL) 1000 MG CAPS Take 1,000 mg by mouth daily.   . polycarbophil (FIBERCON) 625 MG tablet Take 625 mg by mouth daily.  . potassium chloride (K-DUR) 10 MEQ tablet Take 1 tablet (10 mEq total) by mouth daily. While on lasix  . tamoxifen (NOLVADEX) 20 MG tablet Take 1 tablet (20 mg total) by mouth daily.  . traMADol (ULTRAM) 50 MG tablet TAKE 1 TABLET EVERY 6 HOURS AS NEEDED FOR PAIN  . [DISCONTINUED] atenolol (TENORMIN) 100 MG tablet Take 50 mg by mouth daily.    Allergies:   Sulfa antibiotics and Sulfonamide derivatives   Social History:  The patient  reports that she has never smoked. She has never used smokeless tobacco. She reports that she does not drink alcohol or use drugs.   Family History:  The patient's family history includes Breast cancer in her unknown relative; Breast cancer (age of onset: 24) in her sister; Coronary artery disease (age of onset: 57) in  her father; Diabetes in her unknown relative; Esophageal cancer in her brother; Heart attack (age of onset: 80) in her mother; Heart disease in her unknown relative; Hypertension in her father and mother; Stomach cancer in her unknown relative; Stroke in her father.  ROS:   Review of Systems  Constitutional: Positive for malaise/fatigue. Negative for chills, diaphoresis, fever and weight loss.  HENT: Negative for congestion.   Eyes: Negative for discharge and redness.  Respiratory: Negative for cough, hemoptysis, sputum production, shortness of breath and wheezing.   Cardiovascular: Positive for chest pain. Negative for palpitations, orthopnea, claudication, leg swelling and PND.  Gastrointestinal: Negative for abdominal pain, blood in stool, heartburn, melena, nausea and vomiting.  Genitourinary: Negative for hematuria.  Musculoskeletal: Negative for falls and myalgias.  Skin: Negative for rash.  Neurological: Negative for dizziness, tingling, tremors, sensory change, speech change, focal weakness, loss of consciousness and weakness.  Endo/Heme/Allergies: Does not bruise/bleed easily.  Psychiatric/Behavioral: Negative for substance abuse. The patient is not nervous/anxious.   All other systems reviewed and are negative.    PHYSICAL EXAM:  VS:  BP 94/60 (BP Location: Right Arm, Patient Position: Sitting, Cuff Size: Normal)   Pulse (!) 57   Ht 5' (1.524 m)   Wt 148 lb 8 oz (67.4 kg)   BMI 29.00 kg/m  BMI: Body mass index is 29 kg/m.  Physical Exam  Constitutional: She is oriented to person, place, and time. She appears well-developed and well-nourished.  HENT:  Head: Normocephalic and atraumatic.  Eyes: Right eye exhibits no discharge. Left eye exhibits no discharge.  Neck: Normal range of motion. No JVD present.  Cardiovascular: Normal rate, regular rhythm, S1 normal and S2 normal. Exam reveals no distant heart sounds, no friction rub, no midsystolic click and no opening snap.    Murmur heard. Pulses:      Posterior tibial pulses are 2+ on the right side and 2+ on the left side.  2/6 systolic murmur best heard at the left lower sternal border  Pulmonary/Chest: Effort normal and breath sounds normal. No respiratory distress. She has no decreased breath sounds. She has no wheezes. She has no rales. She exhibits no tenderness.  Abdominal: Soft. She exhibits no distension. There is no abdominal tenderness.  Musculoskeletal:  General: No edema.  Neurological: She is alert and oriented to person, place, and time.  Skin: Skin is warm and dry. No cyanosis. Nails show no clubbing.  Psychiatric: She has a normal mood and affect. Her speech is normal and behavior is normal. Judgment and thought content normal.     EKG:  Was ordered and interpreted by me today. Shows sinus bradycardia, 57 bpm, no acute ST-T changes  Recent Labs: 11/06/2017: Hemoglobin 9.3; Platelets 143 02/23/2018: ALT 13; BUN 14; Creatinine, Ser 0.76; Potassium 3.2; Sodium 138  02/23/2018: Cholesterol 91; HDL 40.60; LDL Cholesterol 33; Total CHOL/HDL Ratio 2; Triglycerides 85.0; VLDL 17.0   Estimated Creatinine Clearance: 52.9 mL/min (by C-G formula based on SCr of 0.76 mg/dL).   Wt Readings from Last 3 Encounters:  03/02/18 148 lb 8 oz (67.4 kg)  02/20/18 147 lb 4 oz (66.8 kg)  11/18/17 150 lb 4 oz (68.2 kg)     Other studies reviewed: Additional studies/records reviewed today include: summarized above  ASSESSMENT AND PLAN:  1. Nonobstructive CAD with possible small vessel disease: Currently chest pain-free.  I suspect her intermittent chest pain is more musculoskeletal or postsurgical.  She can continue Imdur 30 mg daily for possible microvascular disease.  Continue aspirin 81 mg daily.  No plans for further ischemic evaluation at this time.  2. Labile hypertension: Blood pressure is on the soft side this morning at 94/60.  Decrease atenolol to 25 mg daily.  She will otherwise continue  amlodipine 5 mg daily, isosorbide mononitrate 30 mg daily, Hyzaar 100/25 mg daily, and as needed Lasix/clonidine.  She will call in 1 week with BP readings given the decrease in atenolol.  3. Chronic diastolic CHF/pulmonary hypertension: She appears well compensated and euvolemic on exam today.  Continue as needed Lasix as above.  4. Hypokalemia: Continue potassium repletion.  Disposition: F/u with Dr. Saunders Revel or an APP in 6 months.  Current medicines are reviewed at length with the patient today.  The patient did not have any concerns regarding medicines.  Signed, Christell Faith, PA-C 03/02/2018 8:25 AM     [address], [place] [address] [phone]

## 2018-03-02 ENCOUNTER — Ambulatory Visit (INDEPENDENT_AMBULATORY_CARE_PROVIDER_SITE_OTHER): Payer: Medicare HMO | Admitting: Physician Assistant

## 2018-03-02 ENCOUNTER — Encounter: Payer: Self-pay | Admitting: Physician Assistant

## 2018-03-02 VITALS — BP 94/60 | HR 57 | Ht 60.0 in | Wt 148.5 lb

## 2018-03-02 DIAGNOSIS — I208 Other forms of angina pectoris: Secondary | ICD-10-CM | POA: Diagnosis not present

## 2018-03-02 DIAGNOSIS — I2510 Atherosclerotic heart disease of native coronary artery without angina pectoris: Secondary | ICD-10-CM

## 2018-03-02 DIAGNOSIS — I5032 Chronic diastolic (congestive) heart failure: Secondary | ICD-10-CM | POA: Diagnosis not present

## 2018-03-02 DIAGNOSIS — R0989 Other specified symptoms and signs involving the circulatory and respiratory systems: Secondary | ICD-10-CM

## 2018-03-02 DIAGNOSIS — I272 Pulmonary hypertension, unspecified: Secondary | ICD-10-CM | POA: Diagnosis not present

## 2018-03-02 MED ORDER — ATENOLOL 25 MG PO TABS: 25.0000 mg | ORAL_TABLET | Freq: Every day | ORAL | 1 refills | Status: DC

## 2018-03-02 NOTE — Patient Instructions (Signed)
Medication Instructions:  DECREASE the Atenolol to 25mg  daily  If you need a refill on your cardiac medications before your next appointment, please call your pharmacy.   Lab work: None ordered If you have labs (blood work) drawn today and your tests are completely normal, you will receive your results only by: Marland Kitchen MyChart Message (if you have MyChart) OR . A paper copy in the mail If you have any lab test that is abnormal or we need to change your treatment, we will call you to review the results.  Testing/Procedures: None ordered  Follow-Up: At [facility], you and your health needs are our priority.  As part of our continuing mission to provide you with exceptional heart care, we have created designated Provider Care Teams.  These Care Teams include your primary Cardiologist (physician) and Advanced Practice Providers (APPs -  Physician Assistants and Nurse Practitioners) who all work together to provide you with the care you need, when you need it. You will need a follow up appointment in 6 months.  Please call our office 2 months in advance to schedule this appointment.  You may see Dr. Saunders Revel or one of the following Advanced Practice Providers on your designated Care Team:   Murray Hodgkins, NP Christell Faith, PA-C . Marrianne Mood, PA-C

## 2018-03-12 ENCOUNTER — Inpatient Hospital Stay: Payer: Medicare HMO | Attending: Hematology and Oncology

## 2018-03-12 DIAGNOSIS — E538 Deficiency of other specified B group vitamins: Secondary | ICD-10-CM

## 2018-03-12 MED ORDER — CYANOCOBALAMIN 1000 MCG/ML IJ SOLN
1000.0000 ug | Freq: Once | INTRAMUSCULAR | Status: AC
  Administered 2018-03-12: 1000 ug via INTRAMUSCULAR

## 2018-03-12 NOTE — Patient Instructions (Signed)
Cyanocobalamin, Vitamin B12 injection What is this medicine? CYANOCOBALAMIN (sye an oh koe BAL a min) is a man made form of vitamin B12. Vitamin B12 is used in the growth of healthy blood cells, nerve cells, and proteins in the body. It also helps with the metabolism of fats and carbohydrates. This medicine is used to treat people who can not absorb vitamin B12. This medicine may be used for other purposes; ask your health care provider or pharmacist if you have questions. COMMON BRAND NAME(S): B-12 Compliance Kit, B-12 Injection Kit, Cyomin, LA-12, Nutri-Twelve, Physicians EZ Use B-12, Primabalt What should I tell my health care provider before I take this medicine? They need to know if you have any of these conditions: -kidney disease -Leber's disease -megaloblastic anemia -an unusual or allergic reaction to cyanocobalamin, cobalt, other medicines, foods, dyes, or preservatives -pregnant or trying to get pregnant -breast-feeding How should I use this medicine? This medicine is injected into a muscle or deeply under the skin. It is usually given by a health care professional in a clinic or doctor's office. However, your doctor may teach you how to inject yourself. Follow all instructions. Talk to your pediatrician regarding the use of this medicine in children. Special care may be needed. Overdosage: If you think you have taken too much of this medicine contact a poison control center or emergency room at once. NOTE: This medicine is only for you. Do not share this medicine with others. What if I miss a dose? If you are given your dose at a clinic or doctor's office, call to reschedule your appointment. If you give your own injections and you miss a dose, take it as soon as you can. If it is almost time for your next dose, take only that dose. Do not take double or extra doses. What may interact with this medicine? -colchicine -heavy alcohol intake This list may not describe all possible  interactions. Give your health care provider a list of all the medicines, herbs, non-prescription drugs, or dietary supplements you use. Also tell them if you smoke, drink alcohol, or use illegal drugs. Some items may interact with your medicine. What should I watch for while using this medicine? Visit your doctor or health care professional regularly. You may need blood work done while you are taking this medicine. You may need to follow a special diet. Talk to your doctor. Limit your alcohol intake and avoid smoking to get the best benefit. What side effects may I notice from receiving this medicine? Side effects that you should report to your doctor or health care professional as soon as possible: -allergic reactions like skin rash, itching or hives, swelling of the face, lips, or tongue -blue tint to skin -chest tightness, pain -difficulty breathing, wheezing -dizziness -red, swollen painful area on the leg Side effects that usually do not require medical attention (report to your doctor or health care professional if they continue or are bothersome): -diarrhea -headache This list may not describe all possible side effects. Call your doctor for medical advice about side effects. You may report side effects to FDA at 1-800-FDA-1088. Where should I keep my medicine? Keep out of the reach of children. Store at room temperature between 15 and 30 degrees C (59 and 85 degrees F). Protect from light. Throw away any unused medicine after the expiration date. NOTE: This sheet is a summary. It may not cover all possible information. If you have questions about this medicine, talk to your doctor, pharmacist, or   health care provider.  2019 Elsevier/Gold Standard (2007-04-20 22:10:20)  

## 2018-03-16 ENCOUNTER — Telehealth: Payer: Self-pay | Admitting: Physician Assistant

## 2018-03-16 NOTE — Telephone Encounter (Signed)
Patient calling to report her blodd pressure. States since decreasing atenolol at last OV, her BP's are running in the 130's over 60-70's. HR in the 60's.  Example: 131/63, HR 61  States she's "feeling fine."  Advised her to continue to monitor periodically and call us if running consistently greater than 140/90.  She verbalized understanding. Routing to [place] to review.

## 2018-03-16 NOTE — Telephone Encounter (Signed)
BPs have improved since decreasing atenolol.  If weight note further increase with readings consistently in the 379D systolic we may need to escalate 1 of her other antihypertensive medications.  Otherwise, for now, continue current doses and periodic monitoring of BP.

## 2018-03-16 NOTE — Telephone Encounter (Signed)
Pt is calling back with her BP resutls. Today 138/78  Pt states she will take for the next several days and call back.

## 2018-04-09 ENCOUNTER — Inpatient Hospital Stay: Payer: Medicare HMO | Attending: Hematology and Oncology

## 2018-04-09 ENCOUNTER — Other Ambulatory Visit: Payer: Self-pay

## 2018-04-09 VITALS — BP 112/68 | HR 58 | Temp 98.4°F | Resp 18

## 2018-04-09 DIAGNOSIS — E538 Deficiency of other specified B group vitamins: Secondary | ICD-10-CM | POA: Insufficient documentation

## 2018-04-09 MED ORDER — CYANOCOBALAMIN 1000 MCG/ML IJ SOLN
1000.0000 ug | Freq: Once | INTRAMUSCULAR | Status: AC
  Administered 2018-04-09: 1000 ug via INTRAMUSCULAR

## 2018-04-09 NOTE — Patient Instructions (Signed)
Cyanocobalamin, Vitamin B12 injection What is this medicine? CYANOCOBALAMIN (sye an oh koe BAL a min) is a man made form of vitamin B12. Vitamin B12 is used in the growth of healthy blood cells, nerve cells, and proteins in the body. It also helps with the metabolism of fats and carbohydrates. This medicine is used to treat people who can not absorb vitamin B12. This medicine may be used for other purposes; ask your health care provider or pharmacist if you have questions. COMMON BRAND NAME(S): B-12 Compliance Kit, B-12 Injection Kit, Cyomin, LA-12, Nutri-Twelve, Physicians EZ Use B-12, Primabalt What should I tell my health care provider before I take this medicine? They need to know if you have any of these conditions: -kidney disease -Leber's disease -megaloblastic anemia -an unusual or allergic reaction to cyanocobalamin, cobalt, other medicines, foods, dyes, or preservatives -pregnant or trying to get pregnant -breast-feeding How should I use this medicine? This medicine is injected into a muscle or deeply under the skin. It is usually given by a health care professional in a clinic or doctor's office. However, your doctor may teach you how to inject yourself. Follow all instructions. Talk to your pediatrician regarding the use of this medicine in children. Special care may be needed. Overdosage: If you think you have taken too much of this medicine contact a poison control center or emergency room at once. NOTE: This medicine is only for you. Do not share this medicine with others. What if I miss a dose? If you are given your dose at a clinic or doctor's office, call to reschedule your appointment. If you give your own injections and you miss a dose, take it as soon as you can. If it is almost time for your next dose, take only that dose. Do not take double or extra doses. What may interact with this medicine? -colchicine -heavy alcohol intake This list may not describe all possible  interactions. Give your health care provider a list of all the medicines, herbs, non-prescription drugs, or dietary supplements you use. Also tell them if you smoke, drink alcohol, or use illegal drugs. Some items may interact with your medicine. What should I watch for while using this medicine? Visit your doctor or health care professional regularly. You may need blood work done while you are taking this medicine. You may need to follow a special diet. Talk to your doctor. Limit your alcohol intake and avoid smoking to get the best benefit. What side effects may I notice from receiving this medicine? Side effects that you should report to your doctor or health care professional as soon as possible: -allergic reactions like skin rash, itching or hives, swelling of the face, lips, or tongue -blue tint to skin -chest tightness, pain -difficulty breathing, wheezing -dizziness -red, swollen painful area on the leg Side effects that usually do not require medical attention (report to your doctor or health care professional if they continue or are bothersome): -diarrhea -headache This list may not describe all possible side effects. Call your doctor for medical advice about side effects. You may report side effects to FDA at 1-800-FDA-1088. Where should I keep my medicine? Keep out of the reach of children. Store at room temperature between 15 and 30 degrees C (59 and 85 degrees F). Protect from light. Throw away any unused medicine after the expiration date. NOTE: This sheet is a summary. It may not cover all possible information. If you have questions about this medicine, talk to your doctor, pharmacist, or   health care provider.  2019 Elsevier/Gold Standard (2007-04-20 22:10:20)  

## 2018-04-23 ENCOUNTER — Ambulatory Visit: Payer: Medicare HMO | Admitting: General Surgery

## 2018-04-25 ENCOUNTER — Other Ambulatory Visit: Payer: Self-pay | Admitting: Family Medicine

## 2018-04-30 ENCOUNTER — Ambulatory Visit (INDEPENDENT_AMBULATORY_CARE_PROVIDER_SITE_OTHER)
Admission: RE | Admit: 2018-04-30 | Discharge: 2018-04-30 | Disposition: A | Discharge status: Home / Self Care | Payer: Medicare HMO | Source: Ambulatory Visit | Attending: Family Medicine | Admitting: Family Medicine

## 2018-04-30 ENCOUNTER — Encounter: Payer: Self-pay | Admitting: Family Medicine

## 2018-04-30 ENCOUNTER — Other Ambulatory Visit: Payer: Self-pay

## 2018-04-30 ENCOUNTER — Ambulatory Visit (INDEPENDENT_AMBULATORY_CARE_PROVIDER_SITE_OTHER): Payer: Medicare HMO | Admitting: Family Medicine

## 2018-04-30 VITALS — BP 132/70 | HR 69 | Temp 98.2°F | Ht 61.0 in | Wt 150.5 lb

## 2018-04-30 DIAGNOSIS — M1711 Unilateral primary osteoarthritis, right knee: Secondary | ICD-10-CM | POA: Diagnosis not present

## 2018-04-30 DIAGNOSIS — M25561 Pain in right knee: Secondary | ICD-10-CM | POA: Diagnosis not present

## 2018-04-30 MED ORDER — METHYLPREDNISOLONE ACETATE 40 MG/ML IJ SUSP
80.0000 mg | Freq: Once | INTRAMUSCULAR | Status: AC
  Administered 2018-04-30: 80 mg via INTRA_ARTICULAR

## 2018-04-30 NOTE — Addendum Note (Signed)
Addended by: Carter Kitten on: 04/30/2018 02:57 PM   Modules accepted: Orders

## 2018-04-30 NOTE — Progress Notes (Signed)
[name] T. [name], MD Primary Care and [place] at [facility] [place], [address] Phone: [phone]  FAX: [phone]  Ashley Ryan - 73 y.o. female  MRN [id number]  Date of Birth: 05-07-1945  Visit Date: 04/30/2018  PCP: [name], MD  Referred by: [name], MD  Chief Complaint  Patient presents with  . Knee Pain    Right   Subjective:   Ashley Ryan is a 73 y.o. very pleasant female patient who presents with the following:  R knee OA: She is a well-known patient.  She has known osteoarthritis of the right knee.  She is status post total knee arthroplasty on the left.  Over the last few weeks she has had significant pain increase in some swelling as well as the majority of her pain medially.  She is not had any trauma, accident that she knows of.  She does have a exercise bicycle at home, and wants to begin that soon.  inj  Past Medical History, Surgical History, Social History, Family History, Problem List, Medications, and Allergies have been reviewed and updated if relevant.  Patient Active Problem List   Diagnosis Date Noted  . Chest pain 11/18/2017  . B12 deficiency 07/24/2017  . Osteopenia of spine 07/03/2017  . Anemia 07/03/2017  . 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 Influenza 03/18/2017  . Foot pain 02/11/2017  . Right knee pain 12/23/2016  . Bilateral foot pain 05/21/2016  . Counseling regarding end of life decision making 11/10/2014  . Closed fracture of fourth metatarsal of right foot 11/01/2014  . 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 GERD (gastroesophageal reflux disease) 10/07/2013  . Stricture and stenosis of esophagus 10/07/2013  . Iron deficiency anemia 09/08/2013  . De Quervain's tenosynovitis, left 07/05/2011  . Diabetic peripheral neuropathy ([place]) 12/27/2009  . Osteoarthritis, bilateral knees 11/23/2009  . Controlled type 2 diabetes with neuropathy ([place])  02/03/2009  . High cholesterol 02/03/2009  . COMMON MIGRAINE 11/03/2008  . ESSENTIAL HYPERTENSION, BENIGN 11/03/2008  . ALLERGIC RHINITIS DUE TO POLLEN 11/03/2008  . HIATAL HERNIA WITH REFLUX 11/03/2008  . DIVERTICULOSIS OF COLON 11/03/2008  . MURMUR 11/03/2008  . GASTRIC ULCER, HX OF 11/03/2008    Past Medical History:  Diagnosis Date  . Anemia   . Arthritis   . Chronic headache   . Complication of anesthesia    prior to 1991 used to have PONV.  none recently.  . Diverticulosis of colon   . Family history of adverse reaction to anesthesia    mother/daighter get sick  . GERD with stricture   . Hard of hearing   . Heart murmur    followed by PCP  . Hiatal hernia   . Hypertension   . Neuropathy    feet  . Osteoporosis   . Pre-diabetes   . Wears dentures    full upper    Past Surgical History:  Procedure Laterality Date  . ABDOMINAL HYSTERECTOMY  1978   one ovary remains  . BREAST BIOPSY Left 05/26/2017   Affirm Bx- path pending - coil clip  . CARDIAC CATHETERIZATION     "yrs ago" all OK.  Marland Kitchen CATARACT EXTRACTION W/PHACO Left 02/21/2016   Procedure: CATARACT EXTRACTION PHACO AND INTRAOCULAR LENS PLACEMENT ([place])  Left eye;  Surgeon: Leandrew Koyanagi, MD;  Location: [place];  Service: Ophthalmology;  Laterality: Left;  Left eye Diabetic  . CATARACT EXTRACTION  W/PHACO Right 03/13/2016   Procedure: CATARACT EXTRACTION PHACO AND INTRAOCULAR LENS PLACEMENT ([facility])  right  diabetic;  Surgeon: Leandrew Koyanagi, MD;  Location: [place];  Service: Ophthalmology;  Laterality: Right;  diabetic  . CHOLECYSTECTOMY  1978  . LEFT HEART CATH AND CORONARY ANGIOGRAPHY N/A 11/06/2017   Procedure: LEFT HEART CATH AND CORONARY ANGIOGRAPHY;  Surgeon: Nelva Bush, MD;  Location: [place] [facility];  Service: Cardiovascular;  Laterality: N/A;  . OOPHORECTOMY     one still left  . RENAL ANGIOGRAPHY  11/06/2017   Procedure: RENAL ANGIOGRAPHY;  Surgeon: Nelva Bush, MD;  Location: [place] [facility];  Service: Cardiovascular;;  . SENTINEL NODE BIOPSY Left 06/13/2017   Procedure: SENTINEL NODE BIOPSY;  Surgeon: Robert Bellow, MD;  Location: [facility];  Service: General;  Laterality: Left;  . 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  Surgeon: Robert Bellow, MD;  Location: [facility];  Service: General;  Laterality: Left;    Social History   Socioeconomic History  . Marital status: Married    Spouse name: Not on file  . Number of children: 2  . Years of education: Not on file  . Highest education level: Not on file  Occupational History  . Occupation: retired Forensic psychologist: RETIRED  Social Needs  . Financial resource strain: Not on file  . Food insecurity:    Worry: Not on file    Inability: Not on file  . Transportation needs:    Medical: Not on file    Non-medical: Not on file  Tobacco Use  . Smoking status: Never Smoker  . Smokeless tobacco: Never Used  Substance and Sexual Activity  . Alcohol use: No  . Drug use: No  . Sexual activity: Not Currently  Lifestyle  . Physical activity:    Days per week: Not on file    Minutes per session: Not on file  . Stress: Not on file  Relationships  . Social connections:    Talks on phone: Not on file    Gets together: Not on file    Attends religious service: Not on file    Active member of club or organization: Not on file    Attends meetings of clubs or organizations: Not on file    Relationship status: Not on file  . Intimate partner violence:    Fear of current or ex partner: Not on file    Emotionally abused: Not on file    Physically abused: Not on file    Forced sexual activity: Not on file  Other Topics Concern  . Not on file  Social History Narrative   Drinks sweet tea, diet sodas 4 a day   No living will, full code (reviewed 2014)    Family History  Problem Relation Age of Onset   . Breast cancer Unknown   . Stomach cancer Unknown   . Diabetes Unknown   . Heart disease Unknown   . Breast cancer Sister 87  . Esophageal cancer Brother   . Hypertension Mother   . Heart attack Mother 30  . Hypertension Father   . Coronary artery disease Father 37       CABG  . Stroke Father     Allergies  Allergen Reactions  . Sulfa Antibiotics Rash    Childhood reaction  . Sulfonamide Derivatives Rash    Childhood reaction.    Medication  list reviewed and updated in full in [place] [facility] [place].  GEN: No fevers, chills. Nontoxic. Primarily MSK c/o today. MSK: Detailed in the HPI GI: tolerating PO intake without difficulty Neuro: No numbness, parasthesias, or tingling associated. Otherwise the pertinent positives of the ROS are noted above.   Objective:   BP 132/70   Pulse 69   Temp 98.2 F (36.8 C) (Oral)   Ht _0  (1.549 m)   Wt 150 lb 8 oz (68.3 kg)   BMI 28.44 kg/m    GEN: WDWN, NAD, Non-toxic, A & O x 3 HEENT: Atraumatic, Normocephalic. Neck supple. No masses, No LAD. Ears and Nose: No external deformity. PSYCH: Normally interactive. Conversant. Not depressed or anxious appearing.  Calm demeanor.    She has difficulty rising from a seated position and getting on the exam table.  Right knee: Full extension, flexion to 115.  Minimal effusion.  Stable to varus and valgus stress.  Anterior and posterior drawer testing is negative.  Notable medial joint line tenderness with minimal to moderate lateral joint line tenderness.  No tenderness at the patella or the patellar tendon or quad tendon.  No peds anserine bursitis.  Radiology: No results found.  Assessment and Plan:   Primary osteoarthritis of right knee - Plan: DG Knee 4 Views W/Patella Right  Long-term knee osteoarthritis with flare.  I am going to check a weightbearing knee films to evaluate the state of her osteoarthritis radiographically.  Recommended exercises routinely, as needed Tylenol and  tramadol.  Aspiration/Injection Procedure Note Ashley Ryan 1945/02/03 Date of procedure: 04/30/2018  Procedure: Large Joint Aspiration / Injection of Knee, R Indications: Pain  Procedure Details Patient verbally consented to procedure. Risks (including potential rare risk of infection), benefits, and alternatives explained. Sterilely prepped with Chloraprep. Ethyl cholride used for anesthesia. 8 cc Lidocaine 1% mixed with 2 mL Depo-Medrol 40 mg injected using the anteromedial approach without difficulty. No complications with procedure and tolerated well. Patient had decreased pain post-injection. Medication: 2 mL of Depo-Medrol 40 mg, equaling Depo-Medrol 80 mg total   Follow-up: No follow-ups on file.  Orders Placed This Encounter  Procedures  . DG Knee 4 Views W/Patella Right    Signed,  Frederico Hamman T. Sehaj Mcenroe, MD   Outpatient Encounter Medications as of 04/30/2018  Medication Sig  . ACCU-CHEK SOFTCLIX LANCETS lancets Use to check blood sugar once daily  . Alcohol Swabs (B-D SINGLE USE SWABS REGULAR) PADS Use to check blood sugar once daily  . amLODipine (NORVASC) 5 MG tablet Take 1 tablet (5 mg total) by mouth daily.  Marland Kitchen aspirin EC 81 MG tablet Take 81 mg by mouth daily.   Marland Kitchen atenolol (TENORMIN) 25 MG tablet Take 1 tablet (25 mg total) by mouth daily.  Marland Kitchen atorvastatin (LIPITOR) 40 MG tablet TAKE 1 TABLET AT BEDTIME  . Blood Glucose Calibration (ACCU-CHEK AVIVA) SOLN Use to check blood sugar once daily  . Blood Glucose Monitoring Suppl (ACCU-CHEK AVIVA PLUS) w/Device KIT Use to check blood sugar once daily  . Calcium Carbonate-Vitamin D (CALCIUM 600+D) 600-400 MG-UNIT per tablet Take 1 tablet by mouth 2 (two) times daily.   . cloNIDine (CATAPRES) 0.1 MG tablet Take 1 tablet (0.1 mg total) by mouth 2 (two) times daily as needed (Systolic blood pressure greater than 180).  Marland Kitchen esomeprazole (NEXIUM) 40 MG capsule TAKE 1 CAPSULE 2 (TWO) TIMES DAILY BEFORE A MEAL.  Marland Kitchen FLUoxetine (PROZAC) 20  MG capsule TAKE 1 CAPSULE EVERY DAY  . fluticasone (FLONASE) 50  MCG/ACT nasal spray PLACE 2 SPRAYS INTO THE NOSE DAILY AS DIRECTED (SUBSTITUTED FOR FLONASE)  . furosemide (LASIX) 20 MG tablet TAKE 1 TABLET BY MOUTH EVERY DAY AS NEEDED  . gabapentin (NEURONTIN) 100 MG capsule TAKE 1 CAPSULE IN THE MORNING, 1 CAPSULE AT LUNCH, 1 CAPSULES AT DINNER AND 2 TO 3 CAPSULES AT BEDTIME (Patient taking differently: Take 100-300 mg by mouth See admin instructions. 100 mg daily with breakfast, 100 mg daily with dinner, and 300 mg daily at bedtime)  . Glucosamine 500 MG TABS Take 500 mg by mouth daily.   Marland Kitchen glucose blood (ACCU-CHEK AVIVA) test strip Use to check blood sugar once daily  . isosorbide mononitrate (IMDUR) 30 MG 24 hr tablet TAKE 1 TABLET BY MOUTH EVERY DAY  . latanoprost (XALATAN) 0.005 % ophthalmic solution Place 1 drop into both eyes at bedtime.  . lidocaine (LIDODERM) 5 % APPLY 1 PATCH ONTO THE SKIN EVERY DAY. REMOVE AND DISCARD PATCH WITHIN 12 HOURS OR AS DIRECTED BY PHYSICIAN.  Marland Kitchen loratadine (CLARITIN) 10 MG tablet Take 10 mg by mouth daily.  Marland Kitchen losartan-hydrochlorothiazide (HYZAAR) 100-25 MG tablet TAKE 1 TABLET EVERY DAY  . Omega-3 Fatty Acids (FISH OIL) 1000 MG CAPS Take 1,000 mg by mouth daily.   . polycarbophil (FIBERCON) 625 MG tablet Take 625 mg by mouth daily.  . potassium chloride (K-DUR) 10 MEQ tablet Take 1 tablet (10 mEq total) by mouth daily. While on lasix  . tamoxifen (NOLVADEX) 20 MG tablet Take 1 tablet (20 mg total) by mouth daily.  . traMADol (ULTRAM) 50 MG tablet TAKE 1 TABLET EVERY 6 HOURS AS NEEDED FOR PAIN   No facility-administered encounter medications on file as of 04/30/2018.

## 2018-05-04 ENCOUNTER — Ambulatory Visit: Payer: Medicare HMO | Admitting: Hematology and Oncology

## 2018-05-04 ENCOUNTER — Other Ambulatory Visit: Payer: Medicare HMO

## 2018-05-07 ENCOUNTER — Inpatient Hospital Stay: Payer: Medicare HMO

## 2018-05-15 ENCOUNTER — Other Ambulatory Visit: Payer: Self-pay | Admitting: Family Medicine

## 2018-05-15 ENCOUNTER — Telehealth: Payer: Self-pay | Admitting: Family Medicine

## 2018-05-15 NOTE — Telephone Encounter (Signed)
Tried calling Ashley Ryan mailbox full.  If Ashley Ryan calls back please see if she can do a virtual appointment with dr Diona Browner.  If so put information in appointments notes

## 2018-05-20 ENCOUNTER — Other Ambulatory Visit: Payer: Self-pay | Admitting: Physician Assistant

## 2018-05-22 ENCOUNTER — Telehealth: Payer: Self-pay | Admitting: Family Medicine

## 2018-05-22 ENCOUNTER — Telehealth: Payer: Self-pay

## 2018-05-22 DIAGNOSIS — D509 Iron deficiency anemia, unspecified: Secondary | ICD-10-CM

## 2018-05-22 DIAGNOSIS — M8588 Other specified disorders of bone density and structure, other site: Secondary | ICD-10-CM

## 2018-05-22 DIAGNOSIS — E538 Deficiency of other specified B group vitamins: Secondary | ICD-10-CM

## 2018-05-22 DIAGNOSIS — E1140 Type 2 diabetes mellitus with diabetic neuropathy, unspecified: Secondary | ICD-10-CM

## 2018-05-22 NOTE — Telephone Encounter (Signed)
-----   Message from Cloyd Stagers, RT sent at 05/20/2018 11:29 AM EDT ----- Regarding: Lab Orders for Tuesday 5.5.2020 Please place lab orders for Tuesday 5.5.2020, Doxy.me visit on Friday 5.8.2020 Thank you, Dyke Maes RT(R)

## 2018-05-22 NOTE — Telephone Encounter (Signed)
Left detailed VM on daughters cell phone w COVID screen and curbside info  Also to come after 8am due to reduced clinic hours we are not able to keep her 730am appt time .  Pts VM is full

## 2018-05-26 ENCOUNTER — Ambulatory Visit (INDEPENDENT_AMBULATORY_CARE_PROVIDER_SITE_OTHER): Payer: Medicare HMO | Admitting: General Surgery

## 2018-05-26 ENCOUNTER — Encounter: Payer: Self-pay | Admitting: General Surgery

## 2018-05-26 ENCOUNTER — Other Ambulatory Visit (INDEPENDENT_AMBULATORY_CARE_PROVIDER_SITE_OTHER): Payer: Medicare HMO

## 2018-05-26 ENCOUNTER — Telehealth: Payer: Self-pay

## 2018-05-26 ENCOUNTER — Other Ambulatory Visit: Payer: Self-pay

## 2018-05-26 ENCOUNTER — Other Ambulatory Visit: Payer: Medicare HMO

## 2018-05-26 VITALS — BP 106/60 | HR 61 | Temp 97.5°F | Resp 14 | Ht 61.0 in | Wt 150.0 lb

## 2018-05-26 DIAGNOSIS — E538 Deficiency of other specified B group vitamins: Secondary | ICD-10-CM | POA: Diagnosis not present

## 2018-05-26 DIAGNOSIS — M8588 Other specified disorders of bone density and structure, other site: Secondary | ICD-10-CM | POA: Diagnosis not present

## 2018-05-26 DIAGNOSIS — Z86010 Personal history of colonic polyps: Secondary | ICD-10-CM | POA: Diagnosis not present

## 2018-05-26 DIAGNOSIS — E1140 Type 2 diabetes mellitus with diabetic neuropathy, unspecified: Secondary | ICD-10-CM | POA: Diagnosis not present

## 2018-05-26 DIAGNOSIS — D509 Iron deficiency anemia, unspecified: Secondary | ICD-10-CM

## 2018-05-26 DIAGNOSIS — D0512 Intraductal carcinoma in situ of left breast: Secondary | ICD-10-CM

## 2018-05-26 LAB — COMPREHENSIVE METABOLIC PANEL
ALT: 12 U/L (ref 0–35)
AST: 13 U/L (ref 0–37)
Albumin: 3.6 g/dL (ref 3.5–5.2)
Alkaline Phosphatase: 44 U/L (ref 39–117)
BUN: 14 mg/dL (ref 6–23)
CO2: 27 mEq/L (ref 19–32)
Calcium: 8.6 mg/dL (ref 8.4–10.5)
Chloride: 102 mEq/L (ref 96–112)
Creatinine, Ser: 0.68 mg/dL (ref 0.40–1.20)
GFR: 84.35 mL/min (ref >60.00)
Glucose, Bld: 102 mg/dL — ABNORMAL HIGH (ref 70–99)
Potassium: 3.2 mEq/L — ABNORMAL LOW (ref 3.5–5.1)
Sodium: 139 mEq/L (ref 135–145)
Total Bilirubin: 0.3 mg/dL (ref 0.2–1.2)
Total Protein: 5.7 g/dL — ABNORMAL LOW (ref 6.0–8.3)

## 2018-05-26 LAB — CBC WITH DIFFERENTIAL/PLATELET
Basophils Absolute: 0 10*3/uL (ref 0.0–0.1)
Basophils Relative: 0.9 % (ref 0.0–3.0)
Eosinophils Absolute: 0.1 10*3/uL (ref 0.0–0.7)
Eosinophils Relative: 2 % (ref 0.0–5.0)
HCT: 32.7 % — ABNORMAL LOW (ref 36.0–46.0)
Hemoglobin: 10.7 g/dL — ABNORMAL LOW (ref 12.0–15.0)
Lymphocytes Relative: 26.2 % (ref 12.0–46.0)
Lymphs Abs: 1.3 10*3/uL (ref 0.7–4.0)
MCHC: 32.7 g/dL (ref 30.0–36.0)
MCV: 83 fl (ref 78.0–100.0)
Monocytes Absolute: 0.6 10*3/uL (ref 0.1–1.0)
Monocytes Relative: 11.8 % (ref 3.0–12.0)
Neutro Abs: 2.8 10*3/uL (ref 1.4–7.7)
Neutrophils Relative %: 59.1 % (ref 43.0–77.0)
Platelets: 160 10*3/uL (ref 150.0–400.0)
RBC: 3.94 Mil/uL (ref 3.87–5.11)
RDW: 16.4 % — ABNORMAL HIGH (ref 11.5–15.5)
WBC: 4.8 10*3/uL (ref 4.0–10.5)

## 2018-05-26 LAB — VITAMIN B12: Vitamin B-12: 384 pg/mL (ref 211–911)

## 2018-05-26 LAB — HEMOGLOBIN A1C: Hgb A1c MFr Bld: 6.6 % — ABNORMAL HIGH (ref 4.6–6.5)

## 2018-05-26 LAB — VITAMIN D 25 HYDROXY (VIT D DEFICIENCY, FRACTURES): VITD: 32.46 ng/mL (ref 30.00–100.00)

## 2018-05-26 LAB — LIPID PANEL
Cholesterol: 99 mg/dL (ref 0–200)
HDL: 44 mg/dL (ref >39.00)
LDL Cholesterol: 35 mg/dL (ref 0–99)
NonHDL: 54.75
Total CHOL/HDL Ratio: 2
Triglycerides: 99 mg/dL (ref 0.0–149.0)
VLDL: 19.8 mg/dL (ref 0.0–40.0)

## 2018-05-26 NOTE — Progress Notes (Signed)
Patient ID: Ashley Ryan, female   DOB: 07-Aug-1945, 73 y.o.   MRN: [id number]  Chief Complaint  Patient presents with  . Follow-up    Breast Cancer    HPI Ashley Ryan is a 73 y.o. female here for her follow up for left breast cancer. She reports at time she has soreness in the left axillary area. She denies any swelling in the hand or arm. She is doing well with taking the Tamoxifen and pediatric ASA as requested. Tolerating well.   HPI  Past Medical History:  Diagnosis Date  . Anemia   . Arthritis   . Breast cancer ([place]) 06/13/2017   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  Mastectomy/ SLN.  Marland Kitchen Chronic headache   . Complication of anesthesia    prior to 1991 used to have PONV.  none recently.  . Diverticulosis of colon   . Family history of adverse reaction to anesthesia    mother/daighter get sick  . GERD with stricture   . Hard of hearing   . Heart murmur    followed by PCP  . Hiatal hernia   . Hypertension   . Neuropathy    feet  . Osteoporosis   . Pre-diabetes   . Wears dentures    full upper    Past Surgical History:  Procedure Laterality Date  . ABDOMINAL HYSTERECTOMY  1978   one ovary remains  . BREAST BIOPSY Left 05/26/2017   Affirm Bx- path pending - coil clip  . CARDIAC CATHETERIZATION     "yrs ago" all OK.  Marland Kitchen CATARACT EXTRACTION W/PHACO Left 02/21/2016   Procedure: CATARACT EXTRACTION PHACO AND INTRAOCULAR LENS PLACEMENT ([place])  Left eye;  Surgeon: Leandrew Koyanagi, MD;  Location: [place];  Service: Ophthalmology;  Laterality: Left;  Left eye Diabetic  . CATARACT EXTRACTION W/PHACO Right 03/13/2016   Procedure: CATARACT EXTRACTION PHACO AND INTRAOCULAR LENS PLACEMENT ([place])  right  diabetic;  Surgeon: Leandrew Koyanagi, MD;  Location: [place];  Service: Ophthalmology;  Laterality: Right;  diabetic  . CHOLECYSTECTOMY  1978  . COLONOSCOPY W/ POLYPECTOMY  09/09/2013   8 mm tubular adenoma of the cecum.  Ashley Emery, MD.  [facility]  . LEFT HEART CATH AND CORONARY ANGIOGRAPHY N/A 11/06/2017   Procedure: LEFT HEART CATH AND CORONARY ANGIOGRAPHY;  Surgeon: Nelva Bush, MD;  Location: [place] [facility];  Service: Cardiovascular;  Laterality: N/A;  . OOPHORECTOMY     one still left  . RENAL ANGIOGRAPHY  11/06/2017   Procedure: RENAL ANGIOGRAPHY;  Surgeon: Nelva Bush, MD;  Location: [place] [facility];  Service: Cardiovascular;;  . SENTINEL NODE BIOPSY Left 06/13/2017   Procedure: SENTINEL NODE BIOPSY;  Surgeon: Robert Bellow, MD;  Location: [facility];  Service: General;  Laterality: Left;  . 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  Surgeon: Robert Bellow, MD;  Location: [facility];  Service: General;  Laterality: Left;    Family History  Problem Relation Age of Onset  . Breast cancer Other   . Stomach cancer Other   . Diabetes Other   . Heart disease Other   . Breast cancer Sister 32  . Esophageal cancer Brother   . Hypertension Mother   . Heart attack Mother 72  . Hypertension Father   . Coronary artery disease Father 29       CABG  . Stroke Father  Social History Social History   Tobacco Use  . Smoking status: Never Smoker  . Smokeless tobacco: Never Used  Substance Use Topics  . Alcohol use: No  . Drug use: No    Allergies  Allergen Reactions  . Sulfa Antibiotics Rash    Childhood reaction  . Sulfonamide Derivatives Rash    Childhood reaction.    Current Outpatient Medications  Medication Sig Dispense Refill  . ACCU-CHEK SOFTCLIX LANCETS lancets Use to check blood sugar once daily 100 each 3  . Alcohol Swabs (B-D SINGLE USE SWABS REGULAR) PADS Use to check blood sugar once daily 100 each 3  . aspirin EC 81 MG tablet Take 81 mg by mouth daily.     Marland Kitchen atenolol (TENORMIN) 25 MG tablet Take 1 tablet (25 mg total) by mouth daily. 90 tablet 1  . atorvastatin (LIPITOR) 40 MG tablet TAKE 1  TABLET AT BEDTIME 90 tablet 2  . Blood Glucose Calibration (ACCU-CHEK AVIVA) SOLN Use to check blood sugar once daily 1 each 0  . Blood Glucose Monitoring Suppl (ACCU-CHEK AVIVA PLUS) w/Device KIT Use to check blood sugar once daily 1 kit 0  . Calcium Carbonate-Vitamin D (CALCIUM 600+D) 600-400 MG-UNIT per tablet Take 1 tablet by mouth 2 (two) times daily.     . cloNIDine (CATAPRES) 0.1 MG tablet Take 1 tablet (0.1 mg total) by mouth 2 (two) times daily as needed (Systolic blood pressure greater than 180). 30 tablet 0  . esomeprazole (NEXIUM) 40 MG capsule TAKE 1 CAPSULE 2 (TWO) TIMES DAILY BEFORE A MEAL. 180 capsule 1  . FLUoxetine (PROZAC) 20 MG capsule TAKE 1 CAPSULE EVERY DAY 90 capsule 1  . fluticasone (FLONASE) 50 MCG/ACT nasal spray PLACE 2 SPRAYS INTO THE NOSE DAILY AS DIRECTED (SUBSTITUTED FOR FLONASE) 48 g 3  . furosemide (LASIX) 20 MG tablet TAKE 1 TABLET BY MOUTH EVERY DAY AS NEEDED 30 tablet 5  . gabapentin (NEURONTIN) 100 MG capsule TAKE 1 CAPSULE IN THE MORNING, 1 CAPSULE AT LUNCH, 1 CAPSULES AT DINNER AND 2 TO 3 CAPSULES AT BEDTIME (Patient taking differently: Take 100-300 mg by mouth See admin instructions. 100 mg daily with breakfast, 100 mg daily with dinner, and 300 mg daily at bedtime) 540 capsule 5  . Glucosamine 500 MG TABS Take 500 mg by mouth daily.     Marland Kitchen glucose blood (ACCU-CHEK AVIVA) test strip Use to check blood sugar once daily 100 each 3  . isosorbide mononitrate (IMDUR) 30 MG 24 hr tablet TAKE 1 TABLET BY MOUTH EVERY DAY 30 tablet 3  . latanoprost (XALATAN) 0.005 % ophthalmic solution Place 1 drop into both eyes at bedtime.    . lidocaine (LIDODERM) 5 % APPLY 1 PATCH ONTO THE SKIN EVERY DAY. REMOVE AND DISCARD PATCH WITHIN 12 HOURS OR AS DIRECTED BY PHYSICIAN. 90 patch 0  . loratadine (CLARITIN) 10 MG tablet Take 10 mg by mouth daily.    Marland Kitchen losartan-hydrochlorothiazide (HYZAAR) 100-25 MG tablet TAKE 1 TABLET EVERY DAY 90 tablet 1  . Omega-3 Fatty Acids (FISH OIL) 1000  MG CAPS Take 1,000 mg by mouth daily.     . polycarbophil (FIBERCON) 625 MG tablet Take 625 mg by mouth daily.    . potassium chloride (K-DUR) 10 MEQ tablet Take 1 tablet (10 mEq total) by mouth daily. While on lasix 30 tablet 1  . tamoxifen (NOLVADEX) 20 MG tablet Take 1 tablet (20 mg total) by mouth daily. 90 tablet 3  . traMADol (ULTRAM) 50 MG tablet  TAKE 1 TABLET EVERY 6 HOURS AS NEEDED FOR PAIN 90 tablet 0  . amLODipine (NORVASC) 5 MG tablet Take 1 tablet (5 mg total) by mouth daily. 90 tablet 3   No current facility-administered medications for this visit.     Review of Systems Review of Systems  Constitutional: Negative.   Respiratory: Negative.   Cardiovascular: Negative.   Gastrointestinal:       Occasional dysphasia with baked chicken.     Blood pressure 106/60, pulse 61, temperature (!) 97.5 F (36.4 C), resp. rate 14, height '5\' 1"'$  (1.549 m), weight 150 lb (68 kg), SpO2 96 %.  Physical Exam Physical Exam Exam conducted with a chaperone present.  Constitutional:      Appearance: She is well-developed.  Eyes:     General: No scleral icterus.    Conjunctiva/sclera: Conjunctivae normal.  Neck:     Musculoskeletal: Neck supple.  Cardiovascular:     Rate and Rhythm: Normal rate and regular rhythm.     Heart sounds: Normal heart sounds.  Pulmonary:     Effort: Pulmonary effort is normal.     Breath sounds: Normal breath sounds.  Chest:     Breasts:        Right: No inverted nipple, mass, nipple discharge, skin change or tenderness.     Lymphadenopathy:     Cervical: No cervical adenopathy.     Upper Body:     Right upper body: No supraclavicular or axillary adenopathy.     Left upper body: No supraclavicular or axillary adenopathy.  Skin:    General: Skin is warm and dry.  Neurological:     Mental Status: She is alert and oriented to person, place, and time.     Data Reviewed Colonoscopy and EGD reports of September 09, 2013 and associated pathology reviewed.   Bone density May 2019: Osteopenia.  Assessment No evidence of recurrent disease involving the left chest wall. Candidate for repeat EGD/ Colonoscopy in August, 2020.   Plan Due for right screening mammogram. On hold secondary to the [place] pandemic.    Follow up in one year with repeat right screening at that time.   Will schedule for an Upper Endoscopy and Colonoscopy in July or August.   .as  HPI, Physical Exam, Assessment and Plan have been scribed under the direction and in the presence of Robert Bellow, MD  Concepcion Living, LPN  I have completed the exam and reviewed the above documentation for accuracy and completeness.  I agree with the above.  Haematologist has been used and any errors in dictation or transcription are unintentional.  Hervey Ard, M.D., F.A.C.S.  Forest Gleason Byrnett 05/26/2018, 9:29 AM

## 2018-05-26 NOTE — Patient Instructions (Addendum)
Due for your right mammogram. We will get that set up in July when we are able to schedule this.  We will get you set up for a Colonoscopy for this summer. We will contact you about this.   Follow up here in one year.

## 2018-05-26 NOTE — Telephone Encounter (Signed)
Copied from [place] [id number]. Topic: Quick Communication - Appointment Cancellation >> May 25, 2018  4:32 PM Nils Flack wrote: Patient called to cancel appointment scheduled for 05/25/18. Patient has not rescheduled their appointment. Please call back to r/s [phone] Thanks   Route to department's PEC pool.

## 2018-05-27 ENCOUNTER — Other Ambulatory Visit: Payer: Self-pay

## 2018-05-27 DIAGNOSIS — Z1231 Encounter for screening mammogram for malignant neoplasm of breast: Secondary | ICD-10-CM

## 2018-05-27 NOTE — Telephone Encounter (Signed)
Appointment 5/12 Pt aware

## 2018-05-28 ENCOUNTER — Ambulatory Visit: Payer: Medicare HMO | Admitting: General Surgery

## 2018-05-29 ENCOUNTER — Ambulatory Visit: Payer: Medicare HMO | Admitting: Family Medicine

## 2018-06-02 ENCOUNTER — Encounter: Payer: Self-pay | Admitting: Family Medicine

## 2018-06-02 ENCOUNTER — Ambulatory Visit (INDEPENDENT_AMBULATORY_CARE_PROVIDER_SITE_OTHER): Payer: Medicare HMO | Admitting: Family Medicine

## 2018-06-02 ENCOUNTER — Other Ambulatory Visit: Payer: Medicare HMO

## 2018-06-02 VITALS — BP 144/65 | HR 61 | Ht 61.0 in | Wt 148.0 lb

## 2018-06-02 DIAGNOSIS — E1142 Type 2 diabetes mellitus with diabetic polyneuropathy: Secondary | ICD-10-CM | POA: Diagnosis not present

## 2018-06-02 DIAGNOSIS — E538 Deficiency of other specified B group vitamins: Secondary | ICD-10-CM | POA: Diagnosis not present

## 2018-06-02 DIAGNOSIS — E1140 Type 2 diabetes mellitus with diabetic neuropathy, unspecified: Secondary | ICD-10-CM | POA: Diagnosis not present

## 2018-06-02 DIAGNOSIS — I10 Essential (primary) hypertension: Secondary | ICD-10-CM

## 2018-06-02 DIAGNOSIS — M79674 Pain in right toe(s): Secondary | ICD-10-CM | POA: Diagnosis not present

## 2018-06-02 DIAGNOSIS — D649 Anemia, unspecified: Secondary | ICD-10-CM | POA: Diagnosis not present

## 2018-06-02 DIAGNOSIS — E7800 Pure hypercholesterolemia, unspecified: Secondary | ICD-10-CM | POA: Diagnosis not present

## 2018-06-02 NOTE — Assessment & Plan Note (Signed)
Well controlled. Continue current medication.  on statin. 

## 2018-06-02 NOTE — Assessment & Plan Note (Signed)
Appears most consistent with OPA but exam limited by virtual visit. Not clearly gout.  If not improving with Ice and elevation in 1-2 weeks ( NSAIDS contraindicated).. eval in office.

## 2018-06-02 NOTE — Assessment & Plan Note (Signed)
Resolved on supplement 

## 2018-06-02 NOTE — Assessment & Plan Note (Signed)
Stable control. 

## 2018-06-02 NOTE — Progress Notes (Signed)
VIRTUAL VISIT Due to national recommendations of social distancing due to [place] 19, a virtual visit is felt to be most appropriate for this patient at this time.   I connected with the patient on 06/02/18 at  2:40 PM EDT by virtual telehealth platform and verified that I am speaking with the correct person using two identifiers.   I discussed the limitations, risks, security and privacy concerns of performing an evaluation and management service by  virtual telehealth platform and the availability of in person appointments. I also discussed with the patient that there may be a patient responsible charge related to this service. The patient expressed understanding and agreed to proceed.  Patient location: Home Provider Location:  [facility] Participants: Eliezer Lofts and [name]   Chief Complaint  Patient presents with  . Diabetes    History of Present Illness:   73 year old female presents for follow up DM, HTN and cholesterol.  Diabetes:   Lab Results  Component Value Date   HGBA1C 6.6 (H) 05/26/2018  Using medications without difficulties: Hypoglycemic episodes: Hyperglycemic episodes: Feet problems:no ulcer... mild toe pain, left great toe Blood Sugars averaging: not recently eye exam within last year:overdue for eye exam.  Elevated Cholesterol: LDL at goal < 100 on atorvastatin. Lab Results  Component Value Date   CHOL 99 05/26/2018   HDL 44.00 05/26/2018   LDLCALC 35 05/26/2018   LDLDIRECT 102.5 07/21/2009   TRIG 99.0 05/26/2018   CHOLHDL 2 05/26/2018  Using medications without problems: Muscle aches:  Diet compliance: moderate Exercise:she is acitve Other complaints:  Hypertension:     Slightly above goal today... despite losartan HCTZ, atenolo and isosorbide. previoply well controlled on reguimen BP Readings from Last 3 Encounters:  06/02/18 (!) 144/65  05/26/18 106/60  04/30/18 132/70  Using medication without problems or lightheadedness:  none Chest pain with exertion:none Edema:none Short of breath:none Average home BPs: Other issues:   COVID 19 screen No recent travel or known exposure to [place] The patient denies respiratory symptoms of COVID 19 at this time.  The importance of social distancing was discussed today.   Review of Systems  Constitutional: Negative for chills and fever.  HENT: Negative for congestion and ear pain.   Eyes: Negative for pain and redness.  Respiratory: Negative for cough and shortness of breath.   Cardiovascular: Negative for chest pain, palpitations and leg swelling.  Gastrointestinal: Negative for abdominal pain, blood in stool, constipation, diarrhea, nausea and vomiting.  Genitourinary: Negative for dysuria.  Musculoskeletal: Negative for falls and myalgias.  Skin: Negative for rash.  Neurological: Negative for dizziness.  Psychiatric/Behavioral: Negative for depression. The patient is not nervous/anxious.       Past Medical History:  Diagnosis Date  . Anemia   . Arthritis   . 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  Mastectomy/ SLN.  Marland Kitchen Chronic headache   . Complication of anesthesia    prior to 1991 used to have PONV.  none recently.  . Diverticulosis of colon   . Family history of adverse reaction to anesthesia    mother/daighter get sick  . GERD with stricture   . Hard of hearing   . Heart murmur    followed by PCP  . Hiatal hernia   . Hypertension   . Neuropathy    feet  . Osteoporosis   . Pre-diabetes   . Wears dentures    full upper    reports  that she has never smoked. She has never used smokeless tobacco. She reports that she does not drink alcohol or use drugs.   Current Outpatient Medications:  .  ACCU-CHEK SOFTCLIX LANCETS lancets, Use to check blood sugar once daily, Disp: 100 each, Rfl: 3 .  Alcohol Swabs (B-D SINGLE USE SWABS REGULAR) PADS, Use to check blood sugar once daily, Disp: 100 each, Rfl: 3 .   amLODipine (NORVASC) 5 MG tablet, Take 1 tablet (5 mg total) by mouth daily., Disp: 90 tablet, Rfl: 3 .  aspirin EC 81 MG tablet, Take 81 mg by mouth daily. , Disp: , Rfl:  .  atenolol (TENORMIN) 25 MG tablet, Take 1 tablet (25 mg total) by mouth daily., Disp: 90 tablet, Rfl: 1 .  atorvastatin (LIPITOR) 40 MG tablet, TAKE 1 TABLET AT BEDTIME, Disp: 90 tablet, Rfl: 2 .  Blood Glucose Calibration (ACCU-CHEK AVIVA) SOLN, Use to check blood sugar once daily, Disp: 1 each, Rfl: 0 .  Blood Glucose Monitoring Suppl (ACCU-CHEK AVIVA PLUS) w/Device KIT, Use to check blood sugar once daily, Disp: 1 kit, Rfl: 0 .  Calcium Carbonate-Vitamin D (CALCIUM 600+D) 600-400 MG-UNIT per tablet, Take 1 tablet by mouth 2 (two) times daily. , Disp: , Rfl:  .  cloNIDine (CATAPRES) 0.1 MG tablet, Take 1 tablet (0.1 mg total) by mouth 2 (two) times daily as needed (Systolic blood pressure greater than 180)., Disp: 30 tablet, Rfl: 0 .  esomeprazole (NEXIUM) 40 MG capsule, TAKE 1 CAPSULE 2 (TWO) TIMES DAILY BEFORE A MEAL., Disp: 180 capsule, Rfl: 1 .  FLUoxetine (PROZAC) 20 MG capsule, TAKE 1 CAPSULE EVERY DAY, Disp: 90 capsule, Rfl: 1 .  fluticasone (FLONASE) 50 MCG/ACT nasal spray, PLACE 2 SPRAYS INTO THE NOSE DAILY AS DIRECTED (SUBSTITUTED FOR FLONASE), Disp: 48 g, Rfl: 3 .  furosemide (LASIX) 20 MG tablet, TAKE 1 TABLET BY MOUTH EVERY DAY AS NEEDED, Disp: 30 tablet, Rfl: 5 .  gabapentin (NEURONTIN) 100 MG capsule, TAKE 1 CAPSULE IN THE MORNING, 1 CAPSULE AT LUNCH, 1 CAPSULES AT DINNER AND 2 TO 3 CAPSULES AT BEDTIME (Patient taking differently: Take 100-300 mg by mouth See admin instructions. 100 mg daily with breakfast, 100 mg daily with dinner, and 300 mg daily at bedtime), Disp: 540 capsule, Rfl: 5 .  Glucosamine 500 MG TABS, Take 500 mg by mouth daily. , Disp: , Rfl:  .  glucose blood (ACCU-CHEK AVIVA) test strip, Use to check blood sugar once daily, Disp: 100 each, Rfl: 3 .  isosorbide mononitrate (IMDUR) 30 MG 24 hr  tablet, TAKE 1 TABLET BY MOUTH EVERY DAY, Disp: 30 tablet, Rfl: 3 .  latanoprost (XALATAN) 0.005 % ophthalmic solution, Place 1 drop into both eyes at bedtime., Disp: , Rfl:  .  lidocaine (LIDODERM) 5 %, APPLY 1 PATCH ONTO THE SKIN EVERY DAY. REMOVE AND DISCARD PATCH WITHIN 12 HOURS OR AS DIRECTED BY PHYSICIAN., Disp: 90 patch, Rfl: 0 .  loratadine (CLARITIN) 10 MG tablet, Take 10 mg by mouth daily., Disp: , Rfl:  .  losartan-hydrochlorothiazide (HYZAAR) 100-25 MG tablet, TAKE 1 TABLET EVERY DAY, Disp: 90 tablet, Rfl: 1 .  Omega-3 Fatty Acids (FISH OIL) 1000 MG CAPS, Take 1,000 mg by mouth daily. , Disp: , Rfl:  .  polycarbophil (FIBERCON) 625 MG tablet, Take 625 mg by mouth daily., Disp: , Rfl:  .  potassium chloride (K-DUR) 10 MEQ tablet, Take 1 tablet (10 mEq total) by mouth daily. While on lasix, Disp: 30 tablet, Rfl: 1 .  tamoxifen (NOLVADEX) 20 MG tablet, Take 1 tablet (20 mg total) by mouth daily., Disp: 90 tablet, Rfl: 3 .  traMADol (ULTRAM) 50 MG tablet, TAKE 1 TABLET EVERY 6 HOURS AS NEEDED FOR PAIN, Disp: 90 tablet, Rfl: 0   Observations/Objective: Blood pressure (!) 144/65, pulse 61, height '5\' 1"'$  (1.549 m), weight 148 lb (67.1 kg).  Physical Exam  Physical Exam Constitutional:      General: The patient is not in acute distress. Pulmonary:     Effort: Pulmonary effort is normal. No respiratory distress.  Neurological:     Mental Status: The patient is alert and oriented to person, place, and time.  Psychiatric:        Mood and Affect: Mood normal.        Behavior: Behavior normal.  Right great toe.. mild swelling in DIP joint, known in MTP joint.. no rednes, good mobility  Assessment and Plan ESSENTIAL HYPERTENSION, BENIGN Well controlled. Continue current medication.   Controlled type 2 diabetes with neuropathy (HCC) Diet controlled.. work on low carb diet and increase exercise.   Diabetic peripheral neuropathy (HCC) Stable control.  Anemia Improved.  B12  deficiency Resolved on supplement.  High cholesterol Well controlled. Continue current medication.  on statin  Toe pain, right Appears most consistent with OPA but exam limited by virtual visit. Not clearly gout.  If not improving with Ice and elevation in 1-2 weeks ( NSAIDS contraindicated).. eval in office.     I discussed the assessment and treatment plan with the patient. The patient was provided an opportunity to ask questions and all were answered. The patient agreed with the plan and demonstrated an understanding of the instructions.   The patient was advised to call back or seek an in-person evaluation if the symptoms worsen or if the condition fails to improve as anticipated.     Eliezer Lofts, MD

## 2018-06-02 NOTE — Assessment & Plan Note (Signed)
Diet controlled.. work on low carb diet and increase exercise.

## 2018-06-02 NOTE — Assessment & Plan Note (Signed)
Well controlled. Continue current medication.  

## 2018-06-02 NOTE — Patient Instructions (Addendum)
Follow BP at home.. gaol < 140/90..; call if remaining elevated.  [place] eye exam when able.  Increase the high potassium food in diet.  Ice and elevate toe.. looks like arthritis.Marland Kitchen if not improving in 1-2 weeks make in office visit to eval.   Hypokalemia Hypokalemia means that the amount of potassium in the blood is lower than normal.Potassium is a chemical that helps regulate the amount of fluid in the body (electrolyte). It also stimulates muscle tightening (contraction) and helps nerves work properly.Normally, most of the body's potassium is inside of cells, and only a very small amount is in the blood. Because the amount in the blood is so small, minor changes to potassium levels in the blood can be life-threatening. What are the causes? This condition may be caused by:  Antibiotic medicine.  Diarrhea or vomiting. Taking too much of a medicine that helps you have a bowel movement (laxative) can cause diarrhea and lead to hypokalemia.  Chronic kidney disease (CKD).  Medicines that help the body get rid of excess fluid (diuretics).  Eating disorders, such as bulimia.  Low magnesium levels in the body.  Sweating a lot. What are the signs or symptoms? Symptoms of this condition include:  Weakness.  Constipation.  Fatigue.  Muscle cramps.  Mental confusion.  Skipped heartbeats or irregular heartbeat (palpitations).  Tingling or numbness. How is this diagnosed? This condition is diagnosed with a blood test. How is this treated? Hypokalemia can be treated by taking potassium supplements by mouth or adjusting the medicines that you take. Treatment may also include eating more foods that contain a lot of potassium. If your potassium level is very low, you may need to get potassium through an IV tube in one of your veins and be monitored in the hospital. Follow these instructions at home:   Take over-the-counter and prescription medicines only as told by your health  care provider. This includes vitamins and supplements.  Eat a healthy diet. A healthy diet includes fresh fruits and vegetables, whole grains, healthy fats, and lean proteins.  If instructed, eat more foods that contain a lot of potassium, such as: ? Nuts, such as peanuts and pistachios. ? Seeds, such as sunflower seeds and pumpkin seeds. ? Peas, lentils, and lima beans. ? Whole grain and bran cereals and breads. ? Fresh fruits and vegetables, such as apricots, avocado, bananas, cantaloupe, kiwi, oranges, tomatoes, asparagus, and potatoes. ? Orange juice. ? Tomato juice. ? Red meats. ? Yogurt.  Keep all follow-up visits as told by your health care provider. This is important. Contact a health care provider if:  You have weakness that gets worse.  You feel your heart pounding or racing.  You vomit.  You have diarrhea.  You have diabetes (diabetes mellitus) and you have trouble keeping your blood sugar (glucose) in your target range. Get help right away if:  You have chest pain.  You have shortness of breath.  You have vomiting or diarrhea that lasts for more than 2 days.  You faint. This information is not intended to replace advice given to you by your health care provider. Make sure you discuss any questions you have with your health care provider. Document Released: 01/07/2005 Document Revised: 08/26/2015 Document Reviewed: 08/26/2015 Elsevier Interactive Patient Education  2019 [employer].

## 2018-06-02 NOTE — Assessment & Plan Note (Signed)
Improved

## 2018-06-03 ENCOUNTER — Inpatient Hospital Stay: Payer: Medicare HMO | Attending: Hematology and Oncology

## 2018-06-03 ENCOUNTER — Other Ambulatory Visit: Payer: Self-pay

## 2018-06-03 ENCOUNTER — Inpatient Hospital Stay: Payer: Medicare HMO

## 2018-06-03 VITALS — BP 127/70 | HR 60 | Temp 98.0°F | Resp 18

## 2018-06-03 DIAGNOSIS — E538 Deficiency of other specified B group vitamins: Secondary | ICD-10-CM

## 2018-06-03 DIAGNOSIS — D509 Iron deficiency anemia, unspecified: Secondary | ICD-10-CM

## 2018-06-03 DIAGNOSIS — D0512 Intraductal carcinoma in situ of left breast: Secondary | ICD-10-CM | POA: Diagnosis not present

## 2018-06-03 LAB — CBC WITH DIFFERENTIAL/PLATELET
Abs Immature Granulocytes: 0.02 10*3/uL (ref 0.00–0.07)
Basophils Absolute: 0 10*3/uL (ref 0.0–0.1)
Basophils Relative: 1 %
Eosinophils Absolute: 0.1 10*3/uL (ref 0.0–0.5)
Eosinophils Relative: 1 %
HCT: 33.7 % — ABNORMAL LOW (ref 36.0–46.0)
Hemoglobin: 10.6 g/dL — ABNORMAL LOW (ref 12.0–15.0)
Immature Granulocytes: 0 %
Lymphocytes Relative: 29 %
Lymphs Abs: 1.5 10*3/uL (ref 0.7–4.0)
MCH: 26.8 pg (ref 26.0–34.0)
MCHC: 31.5 g/dL (ref 30.0–36.0)
MCV: 85.3 fL (ref 80.0–100.0)
Monocytes Absolute: 0.6 10*3/uL (ref 0.1–1.0)
Monocytes Relative: 11 %
Neutro Abs: 3 10*3/uL (ref 1.7–7.7)
Neutrophils Relative %: 58 %
Platelets: 179 10*3/uL (ref 150–400)
RBC: 3.95 MIL/uL (ref 3.87–5.11)
RDW: 16.8 % — ABNORMAL HIGH (ref 11.5–15.5)
WBC: 5.1 10*3/uL (ref 4.0–10.5)
nRBC: 0 % (ref 0.0–0.2)

## 2018-06-03 LAB — COMPREHENSIVE METABOLIC PANEL
ALT: 14 U/L (ref 0–44)
AST: 17 U/L (ref 15–41)
Albumin: 3.7 g/dL (ref 3.5–5.0)
Alkaline Phosphatase: 41 U/L (ref 38–126)
Anion gap: 8 (ref 5–15)
BUN: 14 mg/dL (ref 8–23)
CO2: 27 mmol/L (ref 22–32)
Calcium: 8.6 mg/dL — ABNORMAL LOW (ref 8.9–10.3)
Chloride: 102 mmol/L (ref 98–111)
Creatinine, Ser: 0.72 mg/dL (ref 0.44–1.00)
GFR calc Af Amer: >60 mL/min (ref >60)
GFR calc non Af Amer: >60 mL/min (ref >60)
Glucose, Bld: 99 mg/dL (ref 70–99)
Potassium: 3.2 mmol/L — ABNORMAL LOW (ref 3.5–5.1)
Sodium: 137 mmol/L (ref 135–145)
Total Bilirubin: 0.6 mg/dL (ref 0.3–1.2)
Total Protein: 6.2 g/dL — ABNORMAL LOW (ref 6.5–8.1)

## 2018-06-03 MED ORDER — CYANOCOBALAMIN 1000 MCG/ML IJ SOLN
1000.0000 ug | Freq: Once | INTRAMUSCULAR | Status: AC
  Administered 2018-06-03: 1000 ug via INTRAMUSCULAR
  Filled 2018-06-03: qty 1

## 2018-06-03 NOTE — Progress Notes (Signed)
Medicare wellness and cpx already scheduled 11/24/202020

## 2018-06-03 NOTE — Progress Notes (Signed)
Pt aware dr Diona Browner 06/03/2018/rbh

## 2018-06-04 ENCOUNTER — Telehealth: Payer: Self-pay | Admitting: Family Medicine

## 2018-06-04 ENCOUNTER — Telehealth: Payer: Self-pay

## 2018-06-04 ENCOUNTER — Other Ambulatory Visit: Payer: Medicare HMO

## 2018-06-04 ENCOUNTER — Inpatient Hospital Stay: Payer: Medicare HMO

## 2018-06-04 ENCOUNTER — Ambulatory Visit: Payer: Medicare HMO | Admitting: Hematology and Oncology

## 2018-06-04 DIAGNOSIS — E876 Hypokalemia: Secondary | ICD-10-CM

## 2018-06-04 NOTE — Telephone Encounter (Signed)
Inbound call from patient returning call. Informed patient of low Potassium and Calcium levels. Patient reports she is currently taking Calcium 600 + Vit D 400: 1 Tab BID. Advised patient PCP was made aware of low potassium levels.   Informed Dr.Corcoran of dose patient is currently taking with Calcium and was informed this is adequate dosing for her.

## 2018-06-04 NOTE — Telephone Encounter (Signed)
[place] at [facility] called regarding patient. Pt's potassium was 3.2. She wanted to make PCP aware in case she wanted to start patient on potassium. She has forwarded labs to Dr. Diona Browner. She can be reach at [phone].

## 2018-06-04 NOTE — Telephone Encounter (Signed)
Labs forwarded to PCP. Contacted PCP office to inform nurse, receptionist sent message informing of results.   Attempted to contact patient. Left message with spouse, Hendricks Milo, requesting for patient to call back regarding lab results.

## 2018-06-04 NOTE — Telephone Encounter (Signed)
-----   Message from Lequita Asal, MD sent at 06/04/2018  1:36 PM EDT ----- Regarding: Please call patient and labs to PCP  Potassium is low.  She is on Lasix. Send labs to PCP/ talk to her nurse.  Calcium is a little low. Ensure she is taking her supplemental calcium.  M ----- Message ----- From: Buel Ream, Lab In [facility] Sent: 06/03/2018   9:27 AM EDT To: Lequita Asal, MD

## 2018-06-05 MED ORDER — POTASSIUM CHLORIDE ER 10 MEQ PO TBCR: 10.0000 meq | EXTENDED_RELEASE_TABLET | Freq: Every day | ORAL | 0 refills | Status: DC

## 2018-06-05 NOTE — Telephone Encounter (Signed)
Ashley Ryan notified as instructed by telephone.  Patient states understanding.  Lab appointment scheduled for 06/09/2018 at 10:05 am.

## 2018-06-05 NOTE — Telephone Encounter (Signed)
Med list states pt taking potassium with lasix.Marland Kitchen is she taking either one of these lately?

## 2018-06-05 NOTE — Telephone Encounter (Signed)
Spoke with Ms. Hipwell.  She states she only takes the K+ when she takes the lasix and she takes the lasix very rarely.  She took the K+ once this week and before that it has probably been 2 weeks ago when she took it.   If you want her to start potassium please send in Rx to [facility] in [place] because she only has a few left.

## 2018-06-05 NOTE — Addendum Note (Signed)
Addended byEliezer Lofts E on: 06/05/2018 03:21 PM   Modules accepted: Orders

## 2018-06-05 NOTE — Telephone Encounter (Signed)
Have her take 5 days of potassium.. make curbside lab appt for potassium check.  new rx sent in.

## 2018-06-09 ENCOUNTER — Other Ambulatory Visit (INDEPENDENT_AMBULATORY_CARE_PROVIDER_SITE_OTHER): Payer: Medicare HMO

## 2018-06-09 DIAGNOSIS — E876 Hypokalemia: Secondary | ICD-10-CM | POA: Diagnosis not present

## 2018-06-09 LAB — POTASSIUM: Potassium: 4.1 mEq/L (ref 3.5–5.1)

## 2018-06-11 NOTE — Telephone Encounter (Signed)
Pt left v/m; pt is on K; pt knows that K is up and pt wants to know if K med needs adjustment or should pt stop the med. Pt request cb.

## 2018-06-12 NOTE — Telephone Encounter (Signed)
Have her return to using the K as previously.Marland Kitchen only when taking alsix prn.

## 2018-06-12 NOTE — Telephone Encounter (Signed)
Left message for Ashley Ryan to return to taking the potassium as previous.  Use when taking the Lasix as needed. I ask that she call back if she has any questions.

## 2018-06-24 ENCOUNTER — Other Ambulatory Visit: Payer: Self-pay

## 2018-06-24 DIAGNOSIS — D0512 Intraductal carcinoma in situ of left breast: Secondary | ICD-10-CM

## 2018-07-02 ENCOUNTER — Ambulatory Visit: Payer: Medicare HMO | Admitting: Hematology and Oncology

## 2018-07-02 ENCOUNTER — Inpatient Hospital Stay: Payer: Medicare HMO

## 2018-07-02 ENCOUNTER — Other Ambulatory Visit: Payer: Medicare HMO

## 2018-07-28 NOTE — Progress Notes (Signed)
[facility]  [address] [place], [place] [address] Phone: [phone]  Fax: [phone]   Clinic Day:  07/29/2018  Referring physician: [name], MD  Chief Complaint: Ashley Ryan is a 73 y.o. female with left breast DCIS s/p mastectomy and B12 deficiency who is seen for 9 month assessment on tamoxifen.  HPI:  The patient was last seen in the medical oncology clinic on 10/31/2017. At that time, she noted some left lateral breast discomfort.  Exam revealed 3+ left lower extremity edema and 2+ right lower extremity edema.  Hemoglobin was 9.7.  Lower extremity duplex was negative for DVT.   She was seen in the ER on 11/03/2017 for hypertension and chest pain. She was given 0.1 mg of clonidine and discharged to follow-up in cardiology and with PCP. She was seen in cardiology on 11/05/2017 and was admitted to the hospital for medical optimization and left heart catheterization. It showed no angiographically significant coronary artery disease. There was mildly elevated left ventricular filling pressure, consistent with diastolic dysfunction (normal LVEF by echo yesterday). Normal bilateral renal arteries. She was discharged on low dose Lasix.  She was seen by her surgeon, Dr. Bary Castilla, on 05/26/2018. There was no evidence of recurrence. Mammogram was postponed due to the COVID-19 pandemic.   Labs followed: 06/03/2018: WBC 5,100, hemoglobin 10.6, hematocrit 33.7, platelets 179,000. Potassium 3.2, calcium 8.6.  06/09/2018: Potassium 4.1.   During the interim, she is doing well. Edema in her bilateral legs has improved with Lasix prn. She had no energy and was short of breath for 3 weeks, which has since resolved. She has shortness of breath with exertion. Hot flashes have resolved.   She continues to have joint pain in her knees, for which she needs a knee replacement. She is down 18lbs since October 2019, but notes she is eating well. Her ideal weight  would be 135-140lbs.   She continues on tamoxifen. She performs monthly breast exams.    Past Medical History:  Diagnosis Date  . Anemia   . Arthritis   . Breast cancer ([place]) 06/13/2017   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  Mastectomy/ SLN.  Marland Kitchen Chronic headache   . Complication of anesthesia    prior to 1991 used to have PONV.  none recently.  . Diverticulosis of colon   . Family history of adverse reaction to anesthesia    mother/daighter get sick  . GERD with stricture   . Hard of hearing   . Heart murmur    followed by PCP  . Hiatal hernia   . Hypertension   . Neuropathy    feet  . Osteoporosis   . Pre-diabetes   . Wears dentures    full upper    Past Surgical History:  Procedure Laterality Date  . ABDOMINAL HYSTERECTOMY  1978   one ovary remains  . BREAST BIOPSY Left 05/26/2017   Affirm Bx- path pending - coil clip  . CARDIAC CATHETERIZATION     "yrs ago" all OK.  Marland Kitchen CATARACT EXTRACTION W/PHACO Left 02/21/2016   Procedure: CATARACT EXTRACTION PHACO AND INTRAOCULAR LENS PLACEMENT ([place])  Left eye;  Surgeon: Leandrew Koyanagi, MD;  Location: [place];  Service: Ophthalmology;  Laterality: Left;  Left eye Diabetic  . CATARACT EXTRACTION W/PHACO Right 03/13/2016   Procedure: CATARACT EXTRACTION PHACO AND INTRAOCULAR LENS PLACEMENT ([place])  right  diabetic;  Surgeon: Leandrew Koyanagi, MD;  Location: [place];  Service: Ophthalmology;  Laterality: Right;  diabetic  . CHOLECYSTECTOMY  1978  . COLONOSCOPY W/ POLYPECTOMY  09/09/2013   8 mm tubular adenoma of the cecum.  Erskine Emery, MD. [facility]  . LEFT HEART CATH AND CORONARY ANGIOGRAPHY N/A 11/06/2017   Procedure: LEFT HEART CATH AND CORONARY ANGIOGRAPHY;  Surgeon: Nelva Bush, MD;  Location: [facility];  Service: Cardiovascular;  Laterality: N/A;  . OOPHORECTOMY     one still left  . RENAL ANGIOGRAPHY  11/06/2017   Procedure: RENAL ANGIOGRAPHY;  Surgeon: Nelva Bush, MD;  Location: [facility];  Service: Cardiovascular;;  . SENTINEL NODE BIOPSY Left 06/13/2017   Procedure: SENTINEL NODE BIOPSY;  Surgeon: Robert Bellow, MD;  Location: [facility];  Service: General;  Laterality: Left;  . 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  Surgeon: Robert Bellow, MD;  Location: [facility];  Service: General;  Laterality: Left;    Family History  Problem Relation Age of Onset  . Breast cancer Other   . Stomach cancer Other   . Diabetes Other   . Heart disease Other   . Breast cancer Sister 69  . Esophageal cancer Brother   . Hypertension Mother   . Heart attack Mother 48  . Hypertension Father   . Coronary artery disease Father 27       CABG  . Stroke Father     Social History:  reports that she has never smoked. She has never used smokeless tobacco. She reports that she does not drink alcohol or use drugs. Her husband's name is Scientist, research (medical).  She is a retired [employer]ar[employer] manager.  She lives in [place].  The patient is accompanied by her husband today.   Allergies:  Allergies  Allergen Reactions  . Sulfa Antibiotics Rash    Childhood reaction  . Sulfonamide Derivatives Rash    Childhood reaction.    Current Medications: Current Outpatient Medications  Medication Sig Dispense Refill  . ACCU-CHEK SOFTCLIX LANCETS lancets Use to check blood sugar once daily 100 each 3  . Alcohol Swabs (B-D SINGLE USE SWABS REGULAR) PADS Use to check blood sugar once daily 100 each 3  . amLODipine (NORVASC) 5 MG tablet Take 1 tablet (5 mg total) by mouth daily. 90 tablet 3  . aspirin EC 81 MG tablet Take 81 mg by mouth daily.     Marland Kitchen atenolol (TENORMIN) 25 MG tablet Take 1 tablet (25 mg total) by mouth daily. 90 tablet 1  . atorvastatin (LIPITOR) 40 MG tablet TAKE 1 TABLET AT BEDTIME 90 tablet 2  . Blood Glucose Calibration (ACCU-CHEK AVIVA) SOLN Use to check blood sugar once daily 1 each 0  .  Blood Glucose Monitoring Suppl (ACCU-CHEK AVIVA PLUS) w/Device KIT Use to check blood sugar once daily 1 kit 0  . Calcium Carbonate-Vitamin D (CALCIUM 600+D) 600-400 MG-UNIT per tablet Take 1 tablet by mouth 2 (two) times daily.     Marland Kitchen esomeprazole (NEXIUM) 40 MG capsule TAKE 1 CAPSULE 2 (TWO) TIMES DAILY BEFORE A MEAL. 180 capsule 1  . FLUoxetine (PROZAC) 20 MG capsule TAKE 1 CAPSULE EVERY DAY 90 capsule 1  . fluticasone (FLONASE) 50 MCG/ACT nasal spray PLACE 2 SPRAYS INTO THE NOSE DAILY AS DIRECTED (SUBSTITUTED FOR FLONASE) 48 g 3  . furosemide (LASIX) 20 MG tablet TAKE 1 TABLET BY MOUTH EVERY DAY AS NEEDED 30 tablet 5  . gabapentin (NEURONTIN) 100 MG capsule TAKE 1  CAPSULE IN THE MORNING, 1 CAPSULE AT LUNCH, 1 CAPSULES AT DINNER AND 2 TO 3 CAPSULES AT BEDTIME (Patient taking differently: Take 100-300 mg by mouth See admin instructions. 100 mg daily with breakfast, 100 mg daily with dinner, and 300 mg daily at bedtime) 540 capsule 5  . Glucosamine 500 MG TABS Take 500 mg by mouth daily.     Marland Kitchen glucose blood (ACCU-CHEK AVIVA) test strip Use to check blood sugar once daily 100 each 3  . isosorbide mononitrate (IMDUR) 30 MG 24 hr tablet TAKE 1 TABLET BY MOUTH EVERY DAY 30 tablet 3  . latanoprost (XALATAN) 0.005 % ophthalmic solution Place 1 drop into both eyes at bedtime.    . lidocaine (LIDODERM) 5 % APPLY 1 PATCH ONTO THE SKIN EVERY DAY. REMOVE AND DISCARD PATCH WITHIN 12 HOURS OR AS DIRECTED BY PHYSICIAN. 90 patch 0  . loratadine (CLARITIN) 10 MG tablet Take 10 mg by mouth daily.    Marland Kitchen losartan-hydrochlorothiazide (HYZAAR) 100-25 MG tablet TAKE 1 TABLET EVERY DAY 90 tablet 1  . Omega-3 Fatty Acids (FISH OIL) 1000 MG CAPS Take 1,000 mg by mouth daily.     . polycarbophil (FIBERCON) 625 MG tablet Take 625 mg by mouth daily.    . potassium chloride (K-DUR) 10 MEQ tablet Take 1 tablet (10 mEq total) by mouth daily. While on lasix (Patient taking differently: Take 10 mEq by mouth as directed. While on  lasix) 30 tablet 0  . tamoxifen (NOLVADEX) 20 MG tablet Take 1 tablet (20 mg total) by mouth daily. 90 tablet 3  . traMADol (ULTRAM) 50 MG tablet TAKE 1 TABLET EVERY 6 HOURS AS NEEDED FOR PAIN 90 tablet 0  . cloNIDine (CATAPRES) 0.1 MG tablet Take 1 tablet (0.1 mg total) by mouth 2 (two) times daily as needed (Systolic blood pressure greater than 180). (Patient not taking: Reported on 07/29/2018) 30 tablet 0   No current facility-administered medications for this visit.     Review of Systems  Constitutional: Positive for weight loss (18lbs since 10/2017; stable x 2 months). Negative for chills, diaphoresis, fever and malaise/fatigue.  HENT: Negative for congestion, ear discharge, ear pain, nosebleeds, sinus pain, sore throat and tinnitus.   Eyes: Negative.  Negative for blurred vision, double vision, photophobia, pain, discharge and redness.  Respiratory: Positive for shortness of breath (exertional). Negative for cough, hemoptysis, sputum production and wheezing.   Cardiovascular: Negative.  Negative for chest pain, palpitations, leg swelling and PND.  Gastrointestinal: Negative.  Negative for abdominal pain, blood in stool, constipation, diarrhea, melena, nausea and vomiting.  Genitourinary: Negative.  Negative for dysuria, frequency, hematuria and urgency.  Musculoskeletal: Positive for joint pain (knees). Negative for back pain, falls, myalgias and neck pain.  Skin: Negative.  Negative for itching and rash.  Neurological: Negative for dizziness, tingling, tremors, sensory change, focal weakness, weakness and headaches.  Endo/Heme/Allergies: Does not bruise/bleed easily.       Minor vasomotor symptoms.  Psychiatric/Behavioral: Negative for depression and memory loss. The patient is not nervous/anxious and does not have insomnia.   All other systems reviewed and are negative.  Performance status (ECOG): 1  Vitals Blood pressure 110/63, pulse (!) 52, temperature 98.1 F (36.7 C),  temperature source Oral, resp. rate 16, weight 150 lb 12.7 oz (68.4 kg), SpO2 100 %.   Physical Exam  Constitutional: She is oriented to person, place, and time. She appears well-developed and well-nourished. No distress.  HENT:  Head: Normocephalic and atraumatic.  Mouth/Throat: Oropharynx is clear  and moist. No oropharyngeal exudate.  Short gray hair. Mask.  Upper dentures.  Eyes: Pupils are equal, round, and reactive to light. Conjunctivae and EOM are normal. No scleral icterus.  Glasses. Blue eyes.   Neck: Normal range of motion. Neck supple.  Cardiovascular: Normal rate, regular rhythm and normal heart sounds.  No murmur heard. Pulmonary/Chest: Effort normal and breath sounds normal. No respiratory distress. She has no wheezes. Right breast exhibits no inverted nipple, no mass, no nipple discharge, no skin change (fibrocystic changes superiorly) and no tenderness. Breasts are asymmetrical (s/p left mastectomy).  Abdominal: Soft. Bowel sounds are normal. She exhibits no distension. There is no abdominal tenderness.  Musculoskeletal: Normal range of motion.        General: Tenderness (BLE) and edema (BLE) present.  Lymphadenopathy:    She has no cervical adenopathy.    She has no axillary adenopathy.       Right: No supraclavicular adenopathy present.       Left: No supraclavicular adenopathy present.  Neurological: She is alert and oriented to person, place, and time.  Skin: Skin is warm and dry. She is not diaphoretic. No erythema.  Psychiatric: She has a normal mood and affect. Her behavior is normal. Judgment and thought content normal.  Nursing note and vitals reviewed.   Appointment on 07/29/2018  Component Date Value Ref Range Status  . Sodium 07/29/2018 136  135 - 145 mmol/L Final  . Potassium 07/29/2018 3.2* 3.5 - 5.1 mmol/L Final  . Chloride 07/29/2018 103  98 - 111 mmol/L Final  . CO2 07/29/2018 23  22 - 32 mmol/L Final  . Glucose, Bld 07/29/2018 99  70 - 99 mg/dL  Final  . BUN 07/29/2018 16  8 - 23 mg/dL Final  . Creatinine, Ser 07/29/2018 0.95  0.44 - 1.00 mg/dL Final  . Calcium 07/29/2018 8.6* 8.9 - 10.3 mg/dL Final  . Total Protein 07/29/2018 6.2* 6.5 - 8.1 g/dL Final  . Albumin 07/29/2018 3.4* 3.5 - 5.0 g/dL Final  . AST 07/29/2018 16  15 - 41 U/L Final  . ALT 07/29/2018 13  0 - 44 U/L Final  . Alkaline Phosphatase 07/29/2018 43  38 - 126 U/L Final  . Total Bilirubin 07/29/2018 0.3  0.3 - 1.2 mg/dL Final  . GFR calc non Af Amer 07/29/2018 59* >60 mL/min Final  . GFR calc Af Amer 07/29/2018 >60  >60 mL/min Final  . Anion gap 07/29/2018 10  5 - 15 Final   Performed at [facility], [address] [place], [place] [address]  . WBC 07/29/2018 6.2  4.0 - 10.5 K/uL Final  . RBC 07/29/2018 3.73* 3.87 - 5.11 MIL/uL Final  . Hemoglobin 07/29/2018 10.1* 12.0 - 15.0 g/dL Final  . HCT 07/29/2018 32.1* 36.0 - 46.0 % Final  . MCV 07/29/2018 86.1  80.0 - 100.0 fL Final  . MCH 07/29/2018 27.1  26.0 - 34.0 pg Final  . MCHC 07/29/2018 31.5  30.0 - 36.0 g/dL Final  . RDW 07/29/2018 15.9* 11.5 - 15.5 % Final  . Platelets 07/29/2018 166  150 - 400 K/uL Final  . nRBC 07/29/2018 0.0  0.0 - 0.2 % Final  . Neutrophils Relative % 07/29/2018 61  % Final  . Neutro Abs 07/29/2018 3.8  1.7 - 7.7 K/uL Final  . Lymphocytes Relative 07/29/2018 27  % Final  . Lymphs Abs 07/29/2018 1.7  0.7 - 4.0 K/uL Final  . Monocytes Relative 07/29/2018 9  % Final  .  Monocytes Absolute 07/29/2018 0.6  0.1 - 1.0 K/uL Final  . Eosinophils Relative 07/29/2018 2  % Final  . Eosinophils Absolute 07/29/2018 0.1  0.0 - 0.5 K/uL Final  . Basophils Relative 07/29/2018 1  % Final  . Basophils Absolute 07/29/2018 0.0  0.0 - 0.1 K/uL Final  . Immature Granulocytes 07/29/2018 0  % Final  . Abs Immature Granulocytes 07/29/2018 0.02  0.00 - 0.07 K/uL Final   Performed at [facility], [address], [place], [place] [address]    Assessment:  Ashley Ryan is a 73 y.o.  female with left breast DCIS s/p simple mastectomy on 06/13/2017.  Pathology revealed at least 8 mm of grade III DCIS.  Margins were negative.  One sentinel lymph node was negative.  DCIS was ER was + (> 90%) and PR + (75%).  Pathologic stage was Tis N0.  She began tamoxifen on 07/03/2017.  Diagnostic left mammogram on 05/14/2017 revealed grouped pleomorphic calcifications within the lower outer quadrant of the LEFT breast, spanning 7 mm.  Bone density study on 11/22/2014 revealed osteopenia with a T-score of -2.4 in the AP spine L1-L4.  Bone density on 06/02/2017 revealed osteopenia with a T-score of -2.1 in the AP spine L1-L4 and -1.9 in the right femoral neck.  She a mild normocytic anemia.  She received a blood transfusion 6 years ago.  She took oral iron x 2 years, but stopped > 1 month ago.  Labs on 06/02/2017 revealed a ferritin 43 with normal iron studies.  Folate was 9.6.  She takes oral B12.    Ferritin has been followed: 5.3 on 08/10/2013, 48.2 on 05/14/2016, and 43 on 06/02/2017.  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  Colonoscopy on 09/09/2013 revealed an 8 mm sessile polyp in the cecum (tubular adenoma).  Symptomatically, she denies any breast concerns.  Exam is stable.  Plan: 1.   Labs today:  CBC with diff, CMP. 2.   Left breast DCIS       Clinically, she is doing well.    Right screening mammogram on 08/03/2018.    Continue tamoxifen. 3.   Osteopenia       Continue calcium and vitamin D supplementation. 4.   B12 deficiency             B12 today and monthly 5.   Iron deficiency             Hemoglobin 10.1.  MCV 86.1.    Ferritin 39 with an iron saturation of 19% and a TIBC of 298.  Consider IV iron if ferritin < 30. 6.   Weight loss             Weight appears to be stable x 2 months, but loss of 18 pounds since 10/2017.  Weight check in 3 months 7.   RTC in 3 months for weight  check (during time for B12). 8.   RTC in 6 months for MD assessment, labs (CBC with diff, CMP), and B12.  I discussed the assessment and treatment plan with the patient.  The patient was provided an opportunity to ask questions and all were answered.  The patient agreed with the plan and demonstrated an understanding of the instructions.  The patient was advised to call back if the symptoms worsen or if the condition fails to improve as anticipated.  I provided 15 minutes of face-to-face time during this this encounter  and > 50% was spent counseling as documented under my assessment and plan.    Lequita Asal, MD, PhD    07/29/2018, 2:42 PM  I, Molly Dorshimer, am acting as Education administrator for [employer]. Mike Gip, MD, PhD.  I, Melissa C. Mike Gip, MD, have reviewed the above documentation for accuracy and completeness, and I agree with the above.

## 2018-07-29 ENCOUNTER — Telehealth: Payer: Self-pay | Admitting: Family Medicine

## 2018-07-29 ENCOUNTER — Inpatient Hospital Stay: Payer: Medicare HMO

## 2018-07-29 ENCOUNTER — Telehealth: Payer: Self-pay

## 2018-07-29 ENCOUNTER — Other Ambulatory Visit: Payer: Self-pay | Admitting: Hematology and Oncology

## 2018-07-29 ENCOUNTER — Other Ambulatory Visit: Payer: Self-pay

## 2018-07-29 ENCOUNTER — Inpatient Hospital Stay: Payer: Medicare HMO | Attending: Hematology and Oncology | Admitting: Hematology and Oncology

## 2018-07-29 VITALS — BP 110/63 | HR 52 | Temp 98.1°F | Resp 16 | Wt 150.8 lb

## 2018-07-29 DIAGNOSIS — E538 Deficiency of other specified B group vitamins: Secondary | ICD-10-CM

## 2018-07-29 DIAGNOSIS — R0602 Shortness of breath: Secondary | ICD-10-CM

## 2018-07-29 DIAGNOSIS — R0609 Other forms of dyspnea: Secondary | ICD-10-CM | POA: Insufficient documentation

## 2018-07-29 DIAGNOSIS — E611 Iron deficiency: Secondary | ICD-10-CM | POA: Insufficient documentation

## 2018-07-29 DIAGNOSIS — D0512 Intraductal carcinoma in situ of left breast: Secondary | ICD-10-CM | POA: Insufficient documentation

## 2018-07-29 DIAGNOSIS — D509 Iron deficiency anemia, unspecified: Secondary | ICD-10-CM

## 2018-07-29 DIAGNOSIS — M25561 Pain in right knee: Secondary | ICD-10-CM | POA: Diagnosis not present

## 2018-07-29 DIAGNOSIS — Z9012 Acquired absence of left breast and nipple: Secondary | ICD-10-CM

## 2018-07-29 DIAGNOSIS — M8588 Other specified disorders of bone density and structure, other site: Secondary | ICD-10-CM | POA: Insufficient documentation

## 2018-07-29 DIAGNOSIS — R634 Abnormal weight loss: Secondary | ICD-10-CM | POA: Insufficient documentation

## 2018-07-29 DIAGNOSIS — M25562 Pain in left knee: Secondary | ICD-10-CM | POA: Diagnosis not present

## 2018-07-29 LAB — IRON AND TIBC
Iron: 55 ug/dL (ref 28–170)
Saturation Ratios: 19 % (ref 10.4–31.8)
TIBC: 298 ug/dL (ref 250–450)
UIBC: 243 ug/dL

## 2018-07-29 LAB — COMPREHENSIVE METABOLIC PANEL
ALT: 13 U/L (ref 0–44)
AST: 16 U/L (ref 15–41)
Albumin: 3.4 g/dL — ABNORMAL LOW (ref 3.5–5.0)
Alkaline Phosphatase: 43 U/L (ref 38–126)
Anion gap: 10 (ref 5–15)
BUN: 16 mg/dL (ref 8–23)
CO2: 23 mmol/L (ref 22–32)
Calcium: 8.6 mg/dL — ABNORMAL LOW (ref 8.9–10.3)
Chloride: 103 mmol/L (ref 98–111)
Creatinine, Ser: 0.95 mg/dL (ref 0.44–1.00)
GFR calc Af Amer: >60 mL/min (ref >60)
GFR calc non Af Amer: 59 mL/min — ABNORMAL LOW (ref >60)
Glucose, Bld: 99 mg/dL (ref 70–99)
Potassium: 3.2 mmol/L — ABNORMAL LOW (ref 3.5–5.1)
Sodium: 136 mmol/L (ref 135–145)
Total Bilirubin: 0.3 mg/dL (ref 0.3–1.2)
Total Protein: 6.2 g/dL — ABNORMAL LOW (ref 6.5–8.1)

## 2018-07-29 LAB — CBC WITH DIFFERENTIAL/PLATELET
Abs Immature Granulocytes: 0.02 10*3/uL (ref 0.00–0.07)
Basophils Absolute: 0 10*3/uL (ref 0.0–0.1)
Basophils Relative: 1 %
Eosinophils Absolute: 0.1 10*3/uL (ref 0.0–0.5)
Eosinophils Relative: 2 %
HCT: 32.1 % — ABNORMAL LOW (ref 36.0–46.0)
Hemoglobin: 10.1 g/dL — ABNORMAL LOW (ref 12.0–15.0)
Immature Granulocytes: 0 %
Lymphocytes Relative: 27 %
Lymphs Abs: 1.7 10*3/uL (ref 0.7–4.0)
MCH: 27.1 pg (ref 26.0–34.0)
MCHC: 31.5 g/dL (ref 30.0–36.0)
MCV: 86.1 fL (ref 80.0–100.0)
Monocytes Absolute: 0.6 10*3/uL (ref 0.1–1.0)
Monocytes Relative: 9 %
Neutro Abs: 3.8 10*3/uL (ref 1.7–7.7)
Neutrophils Relative %: 61 %
Platelets: 166 10*3/uL (ref 150–400)
RBC: 3.73 MIL/uL — ABNORMAL LOW (ref 3.87–5.11)
RDW: 15.9 % — ABNORMAL HIGH (ref 11.5–15.5)
WBC: 6.2 10*3/uL (ref 4.0–10.5)
nRBC: 0 % (ref 0.0–0.2)

## 2018-07-29 LAB — VITAMIN B12: Vitamin B-12: 324 pg/mL (ref 180–914)

## 2018-07-29 LAB — FERRITIN: Ferritin: 39 ng/mL (ref 11–307)

## 2018-07-29 LAB — FOLATE: Folate: 6.2 ng/mL (ref >5.9)

## 2018-07-29 MED ORDER — CYANOCOBALAMIN 1000 MCG/ML IJ SOLN
1000.0000 ug | Freq: Once | INTRAMUSCULAR | Status: AC
  Administered 2018-07-29: 15:00:00 1000 ug via INTRAMUSCULAR

## 2018-07-29 NOTE — Telephone Encounter (Signed)
[facility] called.  Ashley Ryan is asymptomatic with 110/70 BP.  Pulse 52.  Not lightheaded and feels fine.   If asx, ok just to observe.

## 2018-07-29 NOTE — Telephone Encounter (Signed)
nonurgent

## 2018-07-29 NOTE — Telephone Encounter (Signed)
Contacted Dr. Rometta Emery office regarding Bradycardia. Informed covering doctor for Dr. Diona Browner of patient's HR and informed him patient is asymptomatic. Doctor reports patient is always low with HR and to inform patient if she becomes symptomatic then to contact office so that she can be seen. Informed patient of information and advised patient should she experience any chest pain, SOB, lightheadedness/dizziness, etc. Then to go to ER.

## 2018-07-29 NOTE — Telephone Encounter (Signed)
Made office aware of low potassium result. Office requested labs to be faxed over. Fax confirmed received. Patient made aware of Hypokalemia as well. Patient reports she will be reaching out to PCP also.

## 2018-07-29 NOTE — Progress Notes (Signed)
Pt here for follow up. Denies any concerns.  

## 2018-07-29 NOTE — Patient Instructions (Signed)

## 2018-07-29 NOTE — Telephone Encounter (Signed)
Cesalea called from [place] and said patient's Potassium is 3.2.  [facility]a will fax report to [place].

## 2018-07-30 ENCOUNTER — Other Ambulatory Visit: Payer: Medicare HMO

## 2018-07-30 ENCOUNTER — Inpatient Hospital Stay: Payer: Medicare HMO

## 2018-07-30 ENCOUNTER — Ambulatory Visit: Payer: Medicare HMO | Admitting: Hematology and Oncology

## 2018-07-30 NOTE — Telephone Encounter (Signed)
Call patient.. she is on lasix  as well as losartan HCTZ  Per med list... she she taking this daily.  Is she taking the potassium daily as well... 10 or 20 meQ?

## 2018-07-31 ENCOUNTER — Encounter: Payer: Self-pay | Admitting: Family Medicine

## 2018-07-31 ENCOUNTER — Other Ambulatory Visit: Payer: Self-pay

## 2018-07-31 ENCOUNTER — Ambulatory Visit (INDEPENDENT_AMBULATORY_CARE_PROVIDER_SITE_OTHER): Payer: Medicare HMO | Admitting: Family Medicine

## 2018-07-31 VITALS — BP 120/60 | HR 54 | Temp 98.7°F | Ht 61.0 in | Wt 150.5 lb

## 2018-07-31 DIAGNOSIS — R001 Bradycardia, unspecified: Secondary | ICD-10-CM | POA: Diagnosis not present

## 2018-07-31 DIAGNOSIS — E876 Hypokalemia: Secondary | ICD-10-CM | POA: Diagnosis not present

## 2018-07-31 DIAGNOSIS — M19079 Primary osteoarthritis, unspecified ankle and foot: Secondary | ICD-10-CM | POA: Diagnosis not present

## 2018-07-31 MED ORDER — DICLOFENAC SODIUM 1 % TD GEL: 2.0000 g | Freq: Four times a day (QID) | TRANSDERMAL | 0 refills | Status: DC

## 2018-07-31 NOTE — Telephone Encounter (Signed)
Ashley Ryan is scheduled to see you today at 10:40 am.

## 2018-07-31 NOTE — Progress Notes (Signed)
Chief Complaint  Patient presents with  . Follow-up    BP and Heartrate per [place]  . 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  No diarrhea, no  Vomiting.  CMET showed low  potassium  On amlodipine ( decreased to '5mg'$ ) , atenolol ( decreased to 25 mg in 02/2018, clonidine prn Losartan HCTZ, using lasix as needed.. has not taken in last 2 weeks.  Only using K when taking lasix.   At home BP 103/53-135/62  Hr 53-62 BP Readings from Last 3 Encounters:  07/31/18 120/60  07/29/18 110/63  06/03/18 127/70  02/2018 57  Pulse Readings from Last 3 Encounters:  07/31/18 (!) 54  07/29/18 (!) 52  06/03/18 60   She does not feel tired any more.  She does have mild edema on  Cardiology: Dr. Saunders Revel  Cath 10.2019  HAs follow up in 08/2018    Sore left great toe distal joint.. no redness,nodule present.  COVID 19 screen No recent travel or known exposure to COVID19 The patient denies respiratory symptoms of COVID 19 at this time.  The importance of social distancing was discussed today.   Review of Systems  Constitutional: Negative for chills, fever and malaise/fatigue.  HENT: Negative for congestion and ear pain.   Eyes: Negative for pain and redness.  Respiratory: Negative for cough and shortness of breath.   Cardiovascular: Negative for chest pain, palpitations and leg swelling.  Gastrointestinal: Negative for abdominal pain, blood in stool, constipation, diarrhea, nausea and vomiting.  Genitourinary: Negative for dysuria.  Musculoskeletal: Negative for falls and myalgias.  Skin: Negative for rash.  Neurological: Negative for dizziness.  Psychiatric/Behavioral: Negative for depression. The patient is not nervous/anxious.       Past Medical History:  Diagnosis Date  . Anemia   . Arthritis   .  Breast cancer ([place]) 06/13/2017   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  Mastectomy/ SLN.  Marland Kitchen Chronic headache   . Complication of anesthesia    prior to 1991 used to have PONV.  none recently.  . Diverticulosis of colon   . Family history of adverse reaction to anesthesia    mother/daighter get sick  . GERD with stricture   . Hard of hearing   . Heart murmur    followed by PCP  . Hiatal hernia   . Hypertension   . Neuropathy    feet  . Osteoporosis   . Pre-diabetes   . Wears dentures    full upper    reports that she has never smoked. She has never used smokeless tobacco. She reports that she does not drink alcohol or use drugs.   Current Outpatient Medications:  .  ACCU-CHEK SOFTCLIX LANCETS lancets, Use to check blood sugar once daily, Disp: 100 each, Rfl: 3 .  Alcohol Swabs (B-D SINGLE USE SWABS REGULAR) PADS, Use to check blood sugar once daily, Disp: 100 each, Rfl: 3 .  amLODipine (NORVASC) 5 MG tablet, Take 1 tablet (5 mg total) by mouth daily., Disp: 90 tablet, Rfl: 3 .  aspirin EC 81 MG tablet, Take 81 mg by mouth daily. , Disp: , Rfl:  .  atenolol (TENORMIN) 25 MG tablet, Take 1 tablet (25 mg total) by mouth daily., Disp: 90 tablet, Rfl: 1 .  atorvastatin (  LIPITOR) 40 MG tablet, TAKE 1 TABLET AT BEDTIME, Disp: 90 tablet, Rfl: 2 .  Blood Glucose Calibration (ACCU-CHEK AVIVA) SOLN, Use to check blood sugar once daily, Disp: 1 each, Rfl: 0 .  Blood Glucose Monitoring Suppl (ACCU-CHEK AVIVA PLUS) w/Device KIT, Use to check blood sugar once daily, Disp: 1 kit, Rfl: 0 .  Calcium Carbonate-Vitamin D (CALCIUM 600+D) 600-400 MG-UNIT per tablet, Take 1 tablet by mouth 2 (two) times daily. , Disp: , Rfl:  .  cloNIDine (CATAPRES) 0.1 MG tablet, Take 1 tablet (0.1 mg total) by mouth 2 (two) times daily as needed (Systolic blood pressure greater than 180)., Disp: 30 tablet, Rfl: 0 .  esomeprazole (NEXIUM) 40 MG capsule, TAKE 1 CAPSULE 2 (TWO) TIMES DAILY BEFORE A MEAL., Disp: 180  capsule, Rfl: 1 .  FLUoxetine (PROZAC) 20 MG capsule, TAKE 1 CAPSULE EVERY DAY, Disp: 90 capsule, Rfl: 1 .  fluticasone (FLONASE) 50 MCG/ACT nasal spray, PLACE 2 SPRAYS INTO THE NOSE DAILY AS DIRECTED (SUBSTITUTED FOR FLONASE), Disp: 48 g, Rfl: 3 .  furosemide (LASIX) 20 MG tablet, TAKE 1 TABLET BY MOUTH EVERY DAY AS NEEDED, Disp: 30 tablet, Rfl: 5 .  gabapentin (NEURONTIN) 100 MG capsule, TAKE 1 CAPSULE IN THE MORNING, 1 CAPSULE AT LUNCH, 1 CAPSULES AT DINNER AND 2 TO 3 CAPSULES AT BEDTIME (Patient taking differently: Take 100-300 mg by mouth See admin instructions. 100 mg daily with breakfast, 100 mg daily with dinner, and 200-300 mg daily at bedtime), Disp: 540 capsule, Rfl: 5 .  Glucosamine 500 MG TABS, Take 500 mg by mouth daily. , Disp: , Rfl:  .  glucose blood (ACCU-CHEK AVIVA) test strip, Use to check blood sugar once daily, Disp: 100 each, Rfl: 3 .  isosorbide mononitrate (IMDUR) 30 MG 24 hr tablet, TAKE 1 TABLET BY MOUTH EVERY DAY, Disp: 30 tablet, Rfl: 3 .  latanoprost (XALATAN) 0.005 % ophthalmic solution, Place 1 drop into both eyes at bedtime., Disp: , Rfl:  .  lidocaine (LIDODERM) 5 %, APPLY 1 PATCH ONTO THE SKIN EVERY DAY. REMOVE AND DISCARD PATCH WITHIN 12 HOURS OR AS DIRECTED BY PHYSICIAN., Disp: 90 patch, Rfl: 0 .  loratadine (CLARITIN) 10 MG tablet, Take 10 mg by mouth daily., Disp: , Rfl:  .  losartan-hydrochlorothiazide (HYZAAR) 100-25 MG tablet, TAKE 1 TABLET EVERY DAY, Disp: 90 tablet, Rfl: 1 .  Omega-3 Fatty Acids (FISH OIL) 1000 MG CAPS, Take 1,000 mg by mouth daily. , Disp: , Rfl:  .  polycarbophil (FIBERCON) 625 MG tablet, Take 625 mg by mouth daily., Disp: , Rfl:  .  potassium chloride (K-DUR) 10 MEQ tablet, Take 1 tablet (10 mEq total) by mouth daily. While on lasix (Patient taking differently: Take 10 mEq by mouth as directed. While on lasix), Disp: 30 tablet, Rfl: 0 .  tamoxifen (NOLVADEX) 20 MG tablet, Take 1 tablet (20 mg total) by mouth daily., Disp: 90 tablet, Rfl:  3 .  traMADol (ULTRAM) 50 MG tablet, TAKE 1 TABLET EVERY 6 HOURS AS NEEDED FOR PAIN, Disp: 90 tablet, Rfl: 0   Observations/Objective: Blood pressure 120/60, pulse (!) 54, temperature 98.7 F (37.1 C), temperature source Temporal, height '5\' 1"'$  (1.549 m), weight 150 lb 8 oz (68.3 kg), SpO2 97 %.  Physical Exam Constitutional:      General: She is not in acute distress.    Appearance: Normal appearance. She is well-developed. She is not ill-appearing or toxic-appearing.  HENT:     Head: Normocephalic.     Right  Ear: Hearing, tympanic membrane, ear canal and external ear normal. Tympanic membrane is not erythematous, retracted or bulging.     Left Ear: Hearing, tympanic membrane, ear canal and external ear normal. Tympanic membrane is not erythematous, retracted or bulging.     Nose: No mucosal edema or rhinorrhea.     Right Sinus: No maxillary sinus tenderness or frontal sinus tenderness.     Left Sinus: No maxillary sinus tenderness or frontal sinus tenderness.     Mouth/Throat:     Pharynx: Uvula midline.  Eyes:     General: Lids are normal. Lids are everted, no foreign bodies appreciated.     Conjunctiva/sclera: Conjunctivae normal.     Pupils: Pupils are equal, round, and reactive to light.  Neck:     Musculoskeletal: Normal range of motion and neck supple.     Thyroid: No thyroid mass or thyromegaly.     Vascular: No carotid bruit.     Trachea: Trachea normal.  Cardiovascular:     Rate and Rhythm: Normal rate and regular rhythm.     Pulses: Normal pulses.     Heart sounds: Normal heart sounds, S1 normal and S2 normal. No murmur. No friction rub. No gallop.   Pulmonary:     Effort: Pulmonary effort is normal. No tachypnea or respiratory distress.     Breath sounds: Normal breath sounds. No decreased breath sounds, wheezing, rhonchi or rales.  Abdominal:     General: Bowel sounds are normal.     Palpations: Abdomen is soft.     Tenderness: There is no abdominal tenderness.   Musculoskeletal:     Comments: Left great toe with deformity and swelling, no effusion and no erythema, mildly ttp  Skin:    General: Skin is warm and dry.     Findings: No rash.  Neurological:     Mental Status: She is alert.  Psychiatric:        Mood and Affect: Mood is not anxious or depressed.        Speech: Speech normal.        Behavior: Behavior normal. Behavior is cooperative.        Thought Content: Thought content normal.        Judgment: Judgment normal.      Assessment and Plan   Bradycardia Discuss with Dr. Saunders Revel stopping the atenolol.     Arthritis of big toe  Can use diclofenac gel on toe for arthritis.    Hypokalemia  Start potassium 10 meq daily for next 2 weeks. Recheck labs.. may need to stay on long term. Can increase high potassium foods.        Eliezer Lofts, MD

## 2018-07-31 NOTE — Patient Instructions (Addendum)
Discuss with Dr. Saunders Revel stopping the atenolol.  Start potassium 10 meq daily for next 2 weeks. Recheck labs.. may need to stay on long term. Can increase high potassium foods.  Can use diclofenac gel on toe for arthritis.  Hypokalemia Hypokalemia means that the amount of potassium in the blood is lower than normal. Potassium is a chemical (electrolyte) that helps regulate the amount of fluid in the body. It also stimulates muscle tightening (contraction) and helps nerves work properly. Normally, most of the body's potassium is inside cells, and only a very small amount is in the blood. Because the amount in the blood is so small, minor changes to potassium levels in the blood can be life-threatening. What are the causes? This condition may be caused by:  Antibiotic medicine.  Diarrhea or vomiting. Taking too much of a medicine that helps you have a bowel movement (laxative) can cause diarrhea and lead to hypokalemia.  Chronic kidney disease (CKD).  Medicines that help the body get rid of excess fluid (diuretics).  Eating disorders, such as bulimia.  Low magnesium levels in the body.  Sweating a lot. What are the signs or symptoms? Symptoms of this condition include:  Weakness.  Constipation.  Fatigue.  Muscle cramps.  Mental confusion.  Skipped heartbeats or irregular heartbeat (palpitations).  Tingling or numbness. How is this diagnosed? This condition is diagnosed with a blood test. How is this treated? This condition may be treated by:  Taking potassium supplements by mouth.  Adjusting the medicines that you take.  Eating more foods that contain a lot of potassium. If your potassium level is very low, you may need to get potassium through an IV and be monitored in the hospital. Follow these instructions at home:   Take over-the-counter and prescription medicines only as told by your health care provider. This includes vitamins and supplements.  Eat a healthy  diet. A healthy diet includes fresh fruits and vegetables, whole grains, healthy fats, and lean proteins.  If instructed, eat more foods that contain a lot of potassium. This includes: ? Nuts, such as peanuts and pistachios. ? Seeds, such as sunflower seeds and pumpkin seeds. ? Peas, lentils, and lima beans. ? Whole grain and bran cereals and breads. ? Fresh fruits and vegetables, such as apricots, avocado, bananas, cantaloupe, kiwi, oranges, tomatoes, asparagus, and potatoes. ? Orange juice. ? Tomato juice. ? Red meats. ? Yogurt.  Keep all follow-up visits as told by your health care provider. This is important. Contact a health care provider if you:  Have weakness that gets worse.  Feel your heart pounding or racing.  Vomit.  Have diarrhea.  Have diabetes (diabetes mellitus) and you have trouble keeping your blood sugar (glucose) in your target range. Get help right away if you:  Have chest pain.  Have shortness of breath.  Have vomiting or diarrhea that lasts for more than 2 days.  Faint. Summary  Hypokalemia means that the amount of potassium in the blood is lower than normal.  This condition is diagnosed with a blood test.  Hypokalemia may be treated by taking potassium supplements, adjusting the medicines that you take, or eating more foods that are high in potassium.  If your potassium level is very low, you may need to get potassium through an IV and be monitored in the hospital. This information is not intended to replace advice given to you by your health care provider. Make sure you discuss any questions you have with your health care provider.  Document Released: 01/07/2005 Document Revised: 08/20/2017 Document Reviewed: 08/20/2017 Elsevier Patient Education  2020 [employer].

## 2018-08-03 ENCOUNTER — Other Ambulatory Visit: Payer: Self-pay

## 2018-08-03 ENCOUNTER — Other Ambulatory Visit: Payer: Self-pay | Admitting: Family Medicine

## 2018-08-03 ENCOUNTER — Ambulatory Visit
Admission: RE | Admit: 2018-08-03 | Discharge: 2018-08-03 | Disposition: A | Discharge status: Home / Self Care | Payer: Medicare HMO | Source: Ambulatory Visit | Attending: General Surgery | Admitting: General Surgery

## 2018-08-03 DIAGNOSIS — Z1231 Encounter for screening mammogram for malignant neoplasm of breast: Secondary | ICD-10-CM | POA: Insufficient documentation

## 2018-08-03 NOTE — Telephone Encounter (Signed)
Electronic refill request Lidocaine and Fluoxetine Lidocaine last refill 02/10/18 #90 Last office visit 71/0/20 Fluoxetine see drug warning with Tamoxifen and Tramadol

## 2018-08-05 ENCOUNTER — Encounter: Payer: Self-pay | Admitting: Gastroenterology

## 2018-08-13 ENCOUNTER — Other Ambulatory Visit: Payer: Self-pay | Admitting: Physician Assistant

## 2018-08-19 ENCOUNTER — Encounter: Payer: Self-pay | Admitting: Hematology and Oncology

## 2018-08-21 ENCOUNTER — Encounter: Payer: Self-pay | Admitting: General Surgery

## 2018-08-27 ENCOUNTER — Inpatient Hospital Stay: Payer: Medicare HMO

## 2018-08-27 DIAGNOSIS — S8002XS Contusion of left knee, sequela: Secondary | ICD-10-CM | POA: Diagnosis not present

## 2018-08-27 DIAGNOSIS — S8001XS Contusion of right knee, sequela: Secondary | ICD-10-CM | POA: Diagnosis not present

## 2018-08-28 ENCOUNTER — Other Ambulatory Visit: Payer: Self-pay

## 2018-08-28 ENCOUNTER — Inpatient Hospital Stay: Payer: Medicare HMO | Attending: Hematology and Oncology

## 2018-08-28 VITALS — BP 113/73 | HR 52 | Temp 97.2°F | Resp 18

## 2018-08-28 DIAGNOSIS — E538 Deficiency of other specified B group vitamins: Secondary | ICD-10-CM

## 2018-08-28 MED ORDER — CYANOCOBALAMIN 1000 MCG/ML IJ SOLN
1000.0000 ug | Freq: Once | INTRAMUSCULAR | Status: AC
  Administered 2018-08-28: 1000 ug via INTRAMUSCULAR

## 2018-08-30 ENCOUNTER — Other Ambulatory Visit: Payer: Self-pay | Admitting: Physician Assistant

## 2018-09-07 DIAGNOSIS — E876 Hypokalemia: Secondary | ICD-10-CM | POA: Insufficient documentation

## 2018-09-07 DIAGNOSIS — M19079 Primary osteoarthritis, unspecified ankle and foot: Secondary | ICD-10-CM | POA: Insufficient documentation

## 2018-09-07 DIAGNOSIS — R001 Bradycardia, unspecified: Secondary | ICD-10-CM | POA: Insufficient documentation

## 2018-09-07 NOTE — Assessment & Plan Note (Signed)
Discuss with Dr. Saunders Revel stopping the atenolol.

## 2018-09-07 NOTE — Assessment & Plan Note (Signed)
Can use diclofenac gel on toe for arthritis.

## 2018-09-07 NOTE — Assessment & Plan Note (Signed)
Start potassium 10 meq daily for next 2 weeks. Recheck labs.. may need to stay on long term. Can increase high potassium foods.

## 2018-09-15 ENCOUNTER — Telehealth: Payer: Self-pay | Admitting: Internal Medicine

## 2018-09-15 NOTE — Telephone Encounter (Signed)
Pt c/o BP issue: STAT if pt c/o blurred vision, one-sided weakness or slurred speech  1. What are your last 5 BP readings? Under 100 past 3 days - today 98/47  2. Are you having any other symptoms (ex. Dizziness, headache, blurred vision, passed out)? Weakness, headaches - probably from allergies, not steady walking  3. What is your BP issue? Patient's blood pressure has been low.  Please call to discuss. Patient scheduled to see R Dunn on 9/1.

## 2018-09-15 NOTE — Telephone Encounter (Signed)
Called patient back. Reviewed med list and she is taking meds as listed. Has not needed clonidine or lasix in "a while." Takes most of her daily BP meds in the morning and says she takes one at night but cannot remember which one.  Reports about 1 week ago BP decreased to the 90's/40's.  Only has today's exact reading of 98/47, HR 57. Before last week was in the 110-118/60's. Reports weakness, lightheadedness and headaches. Denies chest pain, SOB or fainting. Reports she stay hydrated with water and [employer]. Admits more coke than she should. Advised its important to limit soft drinks/sweet tea/sugarly drinks and stick mainly with water. She is schedule to see Ryan on 09/22/18. Advised I will route to Dr End for further advice at this time.

## 2018-09-15 NOTE — Telephone Encounter (Signed)
Called patient. She is on her way to our office with her husband for his appointment. She asked I write the instructions down and give them to her when she arrives with her husband. Note given to Network engineer at front desk.

## 2018-09-15 NOTE — Telephone Encounter (Signed)
I agree with increasing water intake and minimizing consumption of caffeinated beverages.  I also recommend that patient cut losartan/HCTZ in half until she sees [employer] on 9/1.  If symptoms worsen, she should go to the ED.  Nelva Bush, MD [facility] Pager: [phone]

## 2018-09-20 NOTE — Progress Notes (Signed)
Cardiology Office Note    Date:  09/22/2018   ID:  Ashley Ryan, DOB Jun 15, 1945, MRN [id number]  PCP:  [name], MD  Cardiologist:  Nelva Bush, MD  Electrophysiologist:  None   Chief Complaint: Follow up  History of Present Illness:   Ashley Ryan is a 73 y.o. female with history of nonobstructive CAD by [place] in 10/2017, HFpEF, pulmonary hypertension, prediabetes, anemia, HTN, hiatal hernia, GERD, and arthritis who presents for follow-up of hypertension.  Patient was seen by Dr. Saunders Revel on 11/05/2017 for evaluation of chest pain and elevated BP. It was noted at that time she had recently been seen in the ED with BP readings in the 673-419 mmHg systolic range with associated chest tightness. She reported a remote cath 5-10 years prior that did not show any significant disease, per her report. BP was 138/70. She was noted to have ongoing chest pain at her visit with [place] on 11/05/2017 and was admitted from the office and ruled out. She underwent diagnostic LHC on 11/06/2017 that showed no angiographically significant CAD with mildly elevated LV filling pressures, consistent with diastolic dysfunction. She also underwent renal angiography which showed normal renal arteries. Echo on 11/06/2017 showed an EF of 55-60%, moderate LVH, no RWMA, Gr1DD, trivial AI, mildly dilated left atrium, RV cavity size was normal with normal RVSF, PASP 35-40 mmHg.  Following this, her BP was running in the low 379K to 240X systolic.  She was last seen in the office in 02/2018 for follow-up of blood pressure that had previously been elevated in the setting of amlodipine following of the patient's medication list for unclear reasons.  With resumption of this medication BP improved to the 735H systolic.  She was seen by PCP in 07/2018 with heart rates in the 50s bpm.  At that time, she was taking atenolol 25 mg twice daily along with clonidine as needed, amlodipine 5 mg, losartan/HCTZ, and Lasix as needed.  Patient  contacted our office on 8/25 with BP running in the 29J systolic for the prior 3 days with associated weakness.  She indicated she had not taken clonidine or Lasix" a well."  She was noted to have been drinking more Coke than usual.  It was recommended the patient decrease her losartan/HCTZ in half and follow-up today.  Patient comes in doing reasonably well.  For the past several weeks she noted increased fatigue.  In this setting she began to check her blood pressure and found readings were in the 90s to low 242A systolic.  Heart rates were typically in the upper 50s to low 60s bpm.  It was noted the patient was drinking mostly Coca-Cola without any water during the day.  With this, she contacted our office as above and her Hyzaar was decreased to 50/12.5 mg daily.  In this setting, she noted an improvement in her energy and blood pressures improved to the 1 teens to 120s with rare reading in the 834H systolic.  She has remained on atenolol 25 mg daily with heart rates typically in the upper 50s to low 60s bpm.  She has not needed any as needed clonidine in several months.  She last needed as needed Lasix approximately 3 weeks ago.  Weight has been stable.  She has more recently decreased soda consumption and increased water intake.  She denies any chest pain, shortness of breath, dizziness, presyncope, or syncope.  No lower extremity swelling, abdominal distention, orthopnea, PND, early satiety.  No falls, BRBPR,  or melena.  Overall since decreasing Hyzaar she has noted a significant improvement in symptoms.  Labs: 07/2018 - Hgb 10.1, PLT 166, potassium 3.2, serum creatinine 0.95, albumin 3.4, AST/ALT normal 05/2018 - A1c 6.6, total cholesterol 99, triglyceride 99, HDL 44, LDL 35    Past Medical History:  Diagnosis Date  . Anemia   . Arthritis   . 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  Mastectomy/ SLN.  Marland Kitchen Chronic headache   . Complication of anesthesia     prior to 1991 used to have PONV.  none recently.  . Diverticulosis of colon   . Family history of adverse reaction to anesthesia    mother/daighter get sick  . GERD with stricture   . Hard of hearing   . Heart murmur    followed by PCP  . Hiatal hernia   . Hypertension   . Neuropathy    feet  . Osteoporosis   . Pre-diabetes   . Wears dentures    full upper    Past Surgical History:  Procedure Laterality Date  . ABDOMINAL HYSTERECTOMY  1978   one ovary remains  . 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 CARDIAC CATHETERIZATION     "yrs ago" all OK.  Marland Kitchen CATARACT EXTRACTION W/PHACO Left 02/21/2016   Procedure: CATARACT EXTRACTION PHACO AND INTRAOCULAR LENS PLACEMENT ([place])  Left eye;  Surgeon: Leandrew Koyanagi, MD;  Location: [facility];  Service: Ophthalmology;  Laterality: Left;  Left eye Diabetic  . CATARACT EXTRACTION W/PHACO Right 03/13/2016   Procedure: CATARACT EXTRACTION PHACO AND INTRAOCULAR LENS PLACEMENT ([place])  right  diabetic;  Surgeon: Leandrew Koyanagi, MD;  Location: [facility];  Service: Ophthalmology;  Laterality: Right;  diabetic  . CHOLECYSTECTOMY  1978  . COLONOSCOPY W/ POLYPECTOMY  09/09/2013   8 mm tubular adenoma of the cecum.  Erskine Emery, MD. [facility]  . LEFT HEART CATH AND CORONARY ANGIOGRAPHY N/A 11/06/2017   Procedure: LEFT HEART CATH AND CORONARY ANGIOGRAPHY;  Surgeon: Nelva Bush, MD;  Location: [facility];  Service: Cardiovascular;  Laterality: N/A;  . MASTECTOMY Left 06/13/2017   mastectomy neg margins  . OOPHORECTOMY     one still left  . RENAL ANGIOGRAPHY  11/06/2017   Procedure: RENAL ANGIOGRAPHY;  Surgeon: Nelva Bush, MD;  Location: [facility];  Service: Cardiovascular;;  . SENTINEL NODE BIOPSY Left 06/13/2017   Procedure: SENTINEL NODE BIOPSY;  Surgeon: Robert Bellow, MD;   Location: [facility];  Service: General;  Laterality: Left;  . 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  Surgeon: Robert Bellow, MD;  Location: [facility];  Service: General;  Laterality: Left;    Current Medications: Current Meds  Medication Sig  . ACCU-CHEK SOFTCLIX LANCETS lancets Use to check blood sugar once daily  . Alcohol Swabs (B-D SINGLE USE SWABS REGULAR) PADS Use to check blood sugar once daily  . amLODipine (NORVASC) 5 MG tablet Take 1 tablet (5 mg total) by mouth daily.  Marland Kitchen aspirin EC 81 MG tablet Take 81 mg by mouth daily.   Marland Kitchen atenolol (TENORMIN) 25 MG tablet TAKE 1 TABLET BY MOUTH EVERY DAY  . atorvastatin (LIPITOR) 40 MG tablet TAKE 1 TABLET AT BEDTIME  . Blood Glucose Calibration (ACCU-CHEK AVIVA) SOLN Use to  check blood sugar once daily  . Blood Glucose Monitoring Suppl (ACCU-CHEK AVIVA PLUS) w/Device KIT Use to check blood sugar once daily  . Calcium Carbonate-Vitamin D (CALCIUM 600+D) 600-400 MG-UNIT per tablet Take 1 tablet by mouth 2 (two) times daily.   . cloNIDine (CATAPRES) 0.1 MG tablet Take 1 tablet (0.1 mg total) by mouth 2 (two) times daily as needed (Systolic blood pressure greater than 180).  . diclofenac sodium (VOLTAREN) 1 % GEL Apply 2 g topically 4 (four) times daily.  Marland Kitchen esomeprazole (NEXIUM) 40 MG capsule TAKE 1 CAPSULE 2 (TWO) TIMES DAILY BEFORE A MEAL.  Marland Kitchen FLUoxetine (PROZAC) 20 MG capsule TAKE 1 CAPSULE EVERY DAY  . fluticasone (FLONASE) 50 MCG/ACT nasal spray PLACE 2 SPRAYS INTO THE NOSE DAILY AS DIRECTED (SUBSTITUTED FOR FLONASE)  . furosemide (LASIX) 20 MG tablet TAKE 1 TABLET BY MOUTH EVERY DAY AS NEEDED  . gabapentin (NEURONTIN) 100 MG capsule TAKE 1 CAPSULE IN THE MORNING, 1 CAPSULE AT LUNCH, 1 CAPSULES AT DINNER AND 2 TO 3 CAPSULES AT BEDTIME (Patient taking differently: Take 100-300 mg by mouth See admin instructions. 100 mg daily with breakfast, 100 mg daily with dinner, and  200-300 mg daily at bedtime)  . Glucosamine 500 MG TABS Take 500 mg by mouth daily.   Marland Kitchen glucose blood (ACCU-CHEK AVIVA) test strip Use to check blood sugar once daily  . isosorbide mononitrate (IMDUR) 30 MG 24 hr tablet TAKE 1 TABLET BY MOUTH EVERY DAY  . latanoprost (XALATAN) 0.005 % ophthalmic solution Place 1 drop into both eyes at bedtime.  . lidocaine (LIDODERM) 5 % APPLY 1 PATCH ONTO THE SKIN EVERY DAY. REMOVE AND DISCARD PATCH WITHIN 12 HOURS OR AS DIRECTED BY PHYSICIAN.  Marland Kitchen loratadine (CLARITIN) 10 MG tablet Take 10 mg by mouth daily.  Marland Kitchen losartan-hydrochlorothiazide (HYZAAR) 100-25 MG tablet TAKE 1 TABLET EVERY DAY (Patient taking differently: Take 0.5 tablets by mouth daily. )  . Omega-3 Fatty Acids (FISH OIL) 1000 MG CAPS Take 1,000 mg by mouth daily.   . polycarbophil (FIBERCON) 625 MG tablet Take 625 mg by mouth daily.  . potassium chloride (K-DUR) 10 MEQ tablet Take 1 tablet (10 mEq total) by mouth daily. While on lasix (Patient taking differently: Take 10 mEq by mouth as directed. While on lasix)  . tamoxifen (NOLVADEX) 20 MG tablet Take 1 tablet (20 mg total) by mouth daily.  . traMADol (ULTRAM) 50 MG tablet TAKE 1 TABLET EVERY 6 HOURS AS NEEDED FOR PAIN     Allergies:   Sulfa antibiotics and Sulfonamide derivatives   Social History   Socioeconomic History  . Marital status: Married    Spouse name: Not on file  . Number of children: 2  . Years of education: Not on file  . Highest education level: Not on file  Occupational History  . Occupation: retired Forensic psychologist: RETIRED  Social Needs  . Financial resource strain: Not on file  . Food insecurity    Worry: Not on file    Inability: Not on file  . Transportation needs    Medical: Not on file    Non-medical: Not on file  Tobacco Use  . Smoking status: Never Smoker  . Smokeless tobacco: Never Used  Substance and Sexual Activity  . Alcohol use: No  . Drug use: No  . Sexual activity: Not Currently  Lifestyle   . Physical activity    Days per week: Not on file    Minutes per session: Not  on file  . Stress: Not on file  Relationships  . Social Herbalist on phone: Not on file    Gets together: Not on file    Attends religious service: Not on file    Active member of club or organization: Not on file    Attends meetings of clubs or organizations: Not on file    Relationship status: Not on file  Other Topics Concern  . Not on file  Social History Narrative   Drinks sweet tea, diet sodas 4 a day   No living will, full code (reviewed 2014)     Family History:  The patient's family history includes Breast cancer in an other family member; Breast cancer (age of onset: 92) in her sister; Coronary artery disease (age of onset: 83) in her father; Diabetes in an other family member; Esophageal cancer in her brother; Heart attack (age of onset: 33) in her mother; Heart disease in an other family member; Hypertension in her father and mother; Stomach cancer in an other family member; Stroke in her father.  ROS:   Review of Systems  Constitutional: Positive for malaise/fatigue. Negative for chills, diaphoresis, fever and weight loss.       Improved  HENT: Negative for congestion.   Eyes: Negative for discharge and redness.  Respiratory: Negative for cough, hemoptysis, sputum production, shortness of breath and wheezing.   Cardiovascular: Negative for chest pain, palpitations, orthopnea, claudication, leg swelling and PND.  Gastrointestinal: Negative for abdominal pain, blood in stool, heartburn, melena, nausea and vomiting.  Genitourinary: Negative for hematuria.  Musculoskeletal: Negative for falls and myalgias.  Skin: Negative for rash.  Neurological: Negative for dizziness, tingling, tremors, sensory change, speech change, focal weakness, loss of consciousness and weakness.  Endo/Heme/Allergies: Does not bruise/bleed easily.  Psychiatric/Behavioral: Negative for substance abuse. The  patient is not nervous/anxious.   All other systems reviewed and are negative.    EKGs/Labs/Other Studies Reviewed:    Studies reviewed were summarized above. The additional studies were reviewed today:  LHC 10/2017: Conclusions: 1. No angiographically significant coronary artery disease. 2. Mildly elevated left ventricular filling pressure, consistent with diastolic dysfunction (normal LVEF by echo yesterday). 3. Normal bilateral renal arteries.  Recommendations: 1. Continue isosorbide mononitrate for possible component of microvascular dysfunction. 2. Consider evaluation for non-cardiac etiologies of chest pain, including GI workup. 3. Restart furosemide 20 mg daily tomorrow. [place] for discharge home this afternoon if no bleeding or vascular injury at right femoral arteriotomy site.  Patient should follow-up in our office in ~2 weeks.  No indication for antiplatelet therapy at this time.  __________  2D Echo 10/2017: - Left ventricle: The cavity size was normal. Wall thickness was   increased in a pattern of moderate LVH. Systolic function was   normal. The estimated ejection fraction was in the range of 55%   to 60%. Wall motion was normal; there were no regional wall   motion abnormalities. Doppler parameters are consistent with   abnormal left ventricular relaxation (grade 1 diastolic   dysfunction). - Aortic valve: There was trivial regurgitation. - Left atrium: The atrium was mildly dilated. - Right ventricle: The cavity size was normal. Systolic function   was normal. - Pulmonary arteries: Systolic pressure was mildly increased, in   the range of 35 mm Hg to 40 mm Hg.   EKG:  EKG is ordered today.  The EKG ordered today demonstrates sinus bradycardia, 54 bpm, first-degree block, no acute ST-T changes Recent  Labs: 07/29/2018: ALT 13; BUN 16; Creatinine, Ser 0.95; Hemoglobin 10.1; Platelets 166; Potassium 3.2; Sodium 136  Recent Lipid Panel    Component Value  Date/Time   CHOL 99 05/26/2018 0913   TRIG 99.0 05/26/2018 0913   HDL 44.00 05/26/2018 0913   CHOLHDL 2 05/26/2018 0913   VLDL 19.8 05/26/2018 0913   LDLCALC 35 05/26/2018 0913   LDLDIRECT 102.5 07/21/2009 0829    PHYSICAL EXAM:    VS:  BP 123/64   Pulse (!) 54   Temp (!) 97.5 F (36.4 C)   Ht 5' (1.524 m)   Wt 151 lb 12.8 oz (68.9 kg)   BMI 29.65 kg/m   BMI: Body mass index is 29.65 kg/m.  Physical Exam  Constitutional: She is oriented to person, place, and time. She appears well-developed and well-nourished.  HENT:  Head: Normocephalic and atraumatic.  Eyes: Right eye exhibits no discharge. Left eye exhibits no discharge.  Neck: Normal range of motion. No JVD present.  Cardiovascular: Regular rhythm, S1 normal and S2 normal. Bradycardia present. Exam reveals no distant heart sounds, no friction rub, no midsystolic click and no opening snap.  Murmur heard. High-pitched blowing decrescendo early diastolic murmur is present with a grade of 1/6 at the upper right sternal border radiating to the apex. Pulses:      Posterior tibial pulses are 2+ on the right side and 2+ on the left side.  Pulmonary/Chest: Effort normal and breath sounds normal. No respiratory distress. She has no decreased breath sounds. She has no wheezes. She has no rales. She exhibits no tenderness.  Abdominal: Soft. She exhibits no distension. There is no abdominal tenderness.  Musculoskeletal:        General: No edema.  Neurological: She is alert and oriented to person, place, and time.  Skin: Skin is warm and dry. No cyanosis. Nails show no clubbing.  Psychiatric: She has a normal mood and affect. Her speech is normal and behavior is normal. Judgment and thought content normal.    Wt Readings from Last 3 Encounters:  09/22/18 151 lb 12.8 oz (68.9 kg)  07/31/18 150 lb 8 oz (68.3 kg)  07/29/18 150 lb 12.7 oz (68.4 kg)     ASSESSMENT & PLAN:   1. Nonobstructive CAD with possible small vessel disease:  No symptoms concerning for angina.  Continue aspirin and risk factor modification.  2. Labile hypertension: Softer blood pressures recently likely in setting of dehydration secondary to increased soda consumption.  With decreasing of Hyzaar to 50/12.5 mg daily and increase in water intake blood pressure has improved.  With this, she has also noted improvement in her fatigue and is back to baseline.  Given her underlying sinus bradycardia we have agreed to discontinue atenolol at this time.  She will continue amlodipine 5 mg daily, Imdur 30 mg daily, and Hyzaar 50/12.5 mg daily.  She continues to have clonidine 0.1 mg twice daily as needed and Lasix 20 mg daily as needed on her medication list though has not needed any of these recently.  She will contact our office if her blood pressure trends greater than 140/90 consistently with the discontinuation of atenolol.  If she does note that, we could consider increasing her amlodipine to 10 mg daily at that time.  3. Bradycardia: Discontinue atenolol as above.  Patient has demonstrated appropriate chronotropic response.  Recent TSH and potassium unrevealing.  No indication for pacemaker at this time.  4. HFpEF/pulmonary hypertension: She appears euvolemic and well compensated.  She has not needed any as needed Lasix.  CHF education.  5. Hypokalemia: Check BMP.  6. Aortic regurgitation: Trivial by echo less than 1 year prior.  Plan for follow-up echo in 6 to 12 months.  Disposition: F/u with Dr. Saunders Revel or an APP in 3 months, sooner if needed.   Medication Adjustments/Labs and Tests Ordered: Current medicines are reviewed at length with the patient today.  Concerns regarding medicines are outlined above. Medication changes, Labs and Tests ordered today are summarized above and listed in the Patient Instructions accessible in Encounters.   Signed, Christell Faith, PA-C 09/22/2018 8:41 AM     [facility] [address] [place] [address] [place],  [place] [address] [phone]

## 2018-09-22 ENCOUNTER — Encounter: Payer: Self-pay | Admitting: Physician Assistant

## 2018-09-22 ENCOUNTER — Telehealth: Payer: Self-pay

## 2018-09-22 ENCOUNTER — Ambulatory Visit (INDEPENDENT_AMBULATORY_CARE_PROVIDER_SITE_OTHER): Payer: Medicare HMO | Admitting: Physician Assistant

## 2018-09-22 ENCOUNTER — Other Ambulatory Visit
Admission: RE | Admit: 2018-09-22 | Discharge: 2018-09-22 | Disposition: A | Discharge status: Home / Self Care | Payer: Medicare HMO | Source: Ambulatory Visit | Attending: Physician Assistant | Admitting: Physician Assistant

## 2018-09-22 ENCOUNTER — Other Ambulatory Visit: Payer: Self-pay

## 2018-09-22 VITALS — BP 123/64 | HR 54 | Temp 97.5°F | Ht 60.0 in | Wt 151.8 lb

## 2018-09-22 DIAGNOSIS — I272 Pulmonary hypertension, unspecified: Secondary | ICD-10-CM | POA: Diagnosis not present

## 2018-09-22 DIAGNOSIS — I5032 Chronic diastolic (congestive) heart failure: Secondary | ICD-10-CM | POA: Diagnosis not present

## 2018-09-22 DIAGNOSIS — R0989 Other specified symptoms and signs involving the circulatory and respiratory systems: Secondary | ICD-10-CM

## 2018-09-22 DIAGNOSIS — E876 Hypokalemia: Secondary | ICD-10-CM

## 2018-09-22 DIAGNOSIS — I351 Nonrheumatic aortic (valve) insufficiency: Secondary | ICD-10-CM | POA: Diagnosis not present

## 2018-09-22 DIAGNOSIS — I2510 Atherosclerotic heart disease of native coronary artery without angina pectoris: Secondary | ICD-10-CM | POA: Diagnosis not present

## 2018-09-22 DIAGNOSIS — R001 Bradycardia, unspecified: Secondary | ICD-10-CM | POA: Diagnosis not present

## 2018-09-22 LAB — BASIC METABOLIC PANEL
Anion gap: 8 (ref 5–15)
BUN: 14 mg/dL (ref 8–23)
CO2: 27 mmol/L (ref 22–32)
Calcium: 8.8 mg/dL — ABNORMAL LOW (ref 8.9–10.3)
Chloride: 105 mmol/L (ref 98–111)
Creatinine, Ser: 0.65 mg/dL (ref 0.44–1.00)
GFR calc Af Amer: >60 mL/min (ref >60)
GFR calc non Af Amer: >60 mL/min (ref >60)
Glucose, Bld: 98 mg/dL (ref 70–99)
Potassium: 3.6 mmol/L (ref 3.5–5.1)
Sodium: 140 mmol/L (ref 135–145)

## 2018-09-22 MED ORDER — POTASSIUM CHLORIDE ER 10 MEQ PO TBCR: 10.0000 meq | EXTENDED_RELEASE_TABLET | Freq: Every day | ORAL | 3 refills | Status: DC

## 2018-09-22 NOTE — Telephone Encounter (Signed)
-----   Message from Rise Mu, PA-C sent at 09/22/2018 10:16 AM EDT ----- Potassium is improved though does remain slightly below goal of 4.0. Kidney function is normal. Random glucose is normal. She can continue current reduced dose of Hyzaar 50/12.5 mg daily. I would like for her to take KCl 10 mEq daily if she is not already doing so.  Recheck BMP in 2 weeks.

## 2018-09-22 NOTE — Patient Instructions (Signed)
Medication Instructions:  1- STOP Atenolol If you need a refill on your cardiac medications before your next appointment, please call your pharmacy.   Lab work: Your physician recommends that you return for lab work TODAY at the medical mall. No appt is needed. Hours are M-F 7AM- 6 PM.  If you have labs (blood work) drawn today and your tests are completely normal, you will receive your results only by: Marland Kitchen MyChart Message (if you have MyChart) OR . A paper copy in the mail If you have any lab test that is abnormal or we need to change your treatment, we will call you to review the results.  Testing/Procedures: None ordered   Follow-Up: At [facility], you and your health needs are our priority.  As part of our continuing mission to provide you with exceptional heart care, we have created designated Provider Care Teams.  These Care Teams include your primary Cardiologist (physician) and Advanced Practice Providers (APPs -  Physician Assistants and Nurse Practitioners) who all work together to provide you with the care you need, when you need it. You will need a follow up appointment in 3 months.  You may see Nelva Bush, MD or Christell Faith, PA-C.   Any Other Special Instructions Will Be Listed Below (If Applicable). Take plain Mucinex or Coricidin HBP for upper respiratory symptoms.

## 2018-09-22 NOTE — Telephone Encounter (Signed)
Call to patient to discuss lab results and medication changes.   Pt reports that she is not currently taking kcl and would like Rx sent.   Order placed for lab repeat in 2 weeks.   Advised pt to call for any further questions or concerns.

## 2018-09-23 ENCOUNTER — Other Ambulatory Visit: Payer: Self-pay

## 2018-09-24 ENCOUNTER — Inpatient Hospital Stay: Payer: Medicare HMO | Attending: Hematology and Oncology

## 2018-09-24 VITALS — BP 123/68 | HR 71 | Temp 96.9°F | Resp 16

## 2018-09-24 DIAGNOSIS — E538 Deficiency of other specified B group vitamins: Secondary | ICD-10-CM | POA: Diagnosis not present

## 2018-09-24 MED ORDER — CYANOCOBALAMIN 1000 MCG/ML IJ SOLN
1000.0000 ug | Freq: Once | INTRAMUSCULAR | Status: AC
  Administered 2018-09-24: 1000 ug via INTRAMUSCULAR

## 2018-09-24 NOTE — Patient Instructions (Signed)

## 2018-10-01 DIAGNOSIS — S8391XA Sprain of unspecified site of right knee, initial encounter: Secondary | ICD-10-CM | POA: Diagnosis not present

## 2018-10-01 DIAGNOSIS — M1711 Unilateral primary osteoarthritis, right knee: Secondary | ICD-10-CM | POA: Diagnosis not present

## 2018-10-01 DIAGNOSIS — M25561 Pain in right knee: Secondary | ICD-10-CM | POA: Diagnosis not present

## 2018-10-14 ENCOUNTER — Other Ambulatory Visit: Payer: Self-pay

## 2018-10-14 ENCOUNTER — Ambulatory Visit (INDEPENDENT_AMBULATORY_CARE_PROVIDER_SITE_OTHER): Payer: Medicare HMO | Admitting: Internal Medicine

## 2018-10-14 ENCOUNTER — Encounter: Payer: Self-pay | Admitting: Internal Medicine

## 2018-10-14 VITALS — BP 132/72 | HR 78 | Ht 60.0 in | Wt 149.5 lb

## 2018-10-14 DIAGNOSIS — I351 Nonrheumatic aortic (valve) insufficiency: Secondary | ICD-10-CM

## 2018-10-14 DIAGNOSIS — R5382 Chronic fatigue, unspecified: Secondary | ICD-10-CM | POA: Diagnosis not present

## 2018-10-14 DIAGNOSIS — I10 Essential (primary) hypertension: Secondary | ICD-10-CM

## 2018-10-14 MED ORDER — AMLODIPINE BESYLATE 10 MG PO TABS: 10.0000 mg | ORAL_TABLET | Freq: Every day | ORAL | 2 refills | Status: DC

## 2018-10-14 NOTE — Progress Notes (Signed)
Follow-up Outpatient Visit Date: 10/14/2018  Primary Care Provider: [name], MD [place] [address]  Chief Complaint: Fatigue and elevated blood pressure  HPI:  Ashley Ryan is a 73 y.o. year-old female with history of HFpEF, pulmonary hypertension, hypertension, hiatal hernia, GERD, prediabetes, anemia, and arthritis, who presents for follow-up of hypertension.  She was seen by Christell Faith, PA, on 09/22/2018.  At that time, she reported increasing fatigue in the setting of soft blood pressures.  Losartan-HCTZ was therefore decreased with resultant improvement in blood pressure and fatigue.  No further intervention was recommended at that time.  Since her las visit, Ashley Ryan has experienced worsening fatigue again.  She is concerned that it is associated with elevated blood pressure.  Readings are often in the 863'O-177'N systolic.  She does not have significant shortness of breath but states that she just doesn't "feel like moving."  She typically naps in front of the TV in the evenings and then goes to be around 1 AM.  She gets up at 7 AM.  She has experienced a few episodes of "gas-like" pain in the left side of her chest that lasts a few minutes.  It is unrelated to exertional and eating.  There are no associated symptoms.  She denies edema.  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  Mastectomy/ SLN.   Chronic headache    Complication of anesthesia    prior to 1991 used to have PONV.  none recently.   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   CATARACT EXTRACTION W/PHACO Left 02/21/2016   Procedure: CATARACT EXTRACTION PHACO AND INTRAOCULAR LENS PLACEMENT ([facility])  Left eye;  Surgeon: Leandrew Koyanagi, MD;  Location: [facility];  Service: Ophthalmology;  Laterality: Left;  Left eye Diabetic   CATARACT EXTRACTION W/PHACO Right 03/13/2016   Procedure: CATARACT EXTRACTION PHACO AND INTRAOCULAR LENS PLACEMENT ([facility])  right  diabetic;  Surgeon: Leandrew Koyanagi, MD;  Location: [facility];  Service: Ophthalmology;  Laterality: Right;  diabetic   CHOLECYSTECTOMY  1978   COLONOSCOPY W/ POLYPECTOMY  09/09/2013   8 mm tubular adenoma of the cecum.  Erskine Emery, MD. [facility]   LEFT HEART CATH AND CORONARY ANGIOGRAPHY N/A 11/06/2017   Procedure: LEFT HEART CATH AND CORONARY ANGIOGRAPHY;  Surgeon: Nelva Bush, MD;  Location: [facility];  Service: Cardiovascular;  Laterality: N/A;   MASTECTOMY Left 06/13/2017   mastectomy neg margins   OOPHORECTOMY     one still left   RENAL ANGIOGRAPHY  11/06/2017   Procedure: RENAL ANGIOGRAPHY;  Surgeon: Nelva Bush, MD;  Location: [facility]  INVASIVE CV LAB;  Service: Cardiovascular;;   SENTINEL NODE BIOPSY Left 06/13/2017   Procedure: SENTINEL NODE BIOPSY;  Surgeon: Robert Bellow, MD;  Location: [facility];  Service: General;  Laterality: Left;   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  Surgeon: Robert Bellow, MD;  Location: [facility];  Service: General;  Laterality: Left;    Current Meds  Medication Sig    ACCU-CHEK SOFTCLIX LANCETS lancets Use to check blood sugar once daily   Alcohol Swabs (B-D SINGLE USE SWABS REGULAR) PADS Use to check blood sugar once daily   aspirin EC 81 MG tablet Take 81 mg by mouth daily.    atorvastatin (LIPITOR) 40 MG tablet TAKE 1 TABLET AT BEDTIME   Blood Glucose Calibration (ACCU-CHEK AVIVA) SOLN Use to check blood sugar once daily   Blood Glucose Monitoring Suppl (ACCU-CHEK AVIVA PLUS) w/Device KIT Use to check blood sugar once daily   Calcium Carbonate-Vitamin D (CALCIUM 600+D) 600-400 MG-UNIT per tablet Take 1 tablet by mouth 2 (two) times daily.    cloNIDine (CATAPRES) 0.1 MG tablet Take 1 tablet (0.1 mg total) by mouth 2 (two) times daily as needed (Systolic blood pressure greater than 180).   diclofenac sodium (VOLTAREN) 1 % GEL Apply 2 g topically 4 (four) times daily.   esomeprazole (NEXIUM) 40 MG capsule TAKE 1 CAPSULE 2 (TWO) TIMES DAILY BEFORE A MEAL.   FLUoxetine (PROZAC) 20 MG capsule TAKE 1 CAPSULE EVERY DAY   fluticasone (FLONASE) 50 MCG/ACT nasal spray PLACE 2 SPRAYS INTO THE NOSE DAILY AS DIRECTED (SUBSTITUTED FOR FLONASE)   furosemide (LASIX) 20 MG tablet TAKE 1 TABLET BY MOUTH EVERY DAY AS NEEDED   gabapentin (NEURONTIN) 100 MG capsule TAKE 1 CAPSULE IN THE MORNING, 1 CAPSULE AT LUNCH, 1 CAPSULES AT DINNER AND 2 TO 3 CAPSULES AT BEDTIME (Patient taking differently: Take 100-300 mg by mouth See admin instructions. 100 mg daily with breakfast, 100 mg daily with dinner, and 200-300 mg daily at bedtime)   Glucosamine 500 MG TABS Take 500 mg by mouth daily.    glucose blood (ACCU-CHEK AVIVA) test strip Use to check blood sugar once daily   isosorbide mononitrate (IMDUR) 30 MG 24 hr tablet TAKE 1 TABLET BY MOUTH EVERY DAY   latanoprost (XALATAN) 0.005 % ophthalmic solution Place 1 drop into both eyes at bedtime.   lidocaine (LIDODERM) 5 % APPLY 1 PATCH ONTO THE SKIN EVERY DAY. REMOVE AND DISCARD PATCH WITHIN 12 HOURS OR AS DIRECTED BY  PHYSICIAN.   loratadine (CLARITIN) 10 MG tablet Take 10 mg by mouth daily.   losartan-hydrochlorothiazide (HYZAAR) 100-25 MG tablet TAKE 1 TABLET EVERY DAY (Patient taking differently: Take 0.5 tablets by mouth daily. )   Omega-3 Fatty Acids (FISH OIL) 1000 MG CAPS Take 1,000 mg by mouth daily.    polycarbophil (FIBERCON) 625 MG tablet Take 625 mg by mouth daily.   potassium chloride (K-DUR) 10 MEQ tablet Take 1 tablet (10 mEq total) by mouth daily. While on lasix   tamoxifen (NOLVADEX) 20 MG tablet Take 1 tablet (20 mg total) by mouth daily.   traMADol (ULTRAM) 50 MG tablet TAKE 1 TABLET EVERY 6 HOURS AS NEEDED FOR PAIN   [DISCONTINUED] amLODipine (NORVASC) 5 MG tablet Take 1 tablet (5 mg total) by mouth daily.    Allergies: Sulfa antibiotics and Sulfonamide derivatives  Social History   Tobacco Use   Smoking status: Never Smoker  Smokeless tobacco: Never Used  Substance Use Topics   Alcohol use: No   Drug use: No    Family History  Problem Relation Age of Onset   Breast cancer Other    Stomach cancer Other    Diabetes Other    Heart disease Other    Breast cancer Sister 31   Esophageal cancer Brother    Hypertension Mother    Heart attack Mother 79   Hypertension Father    Coronary artery disease Father 9       CABG   Stroke Father     Review of Systems: A 12-system review of systems was performed and was negative except as noted in the HPI.  --------------------------------------------------------------------------------------------------  Physical Exam: BP 132/72 (BP Location: Left Arm, Patient Position: Sitting, Cuff Size: Normal)    Pulse 78    Ht 5' (1.524 m)    Wt 149 lb 8 oz (67.8 kg)    SpO2 99%    BMI 29.20 kg/m   General:  NAD HEENT: No conjunctival pallor or scleral icterus. Facemask in place. Neck: Supple without lymphadenopathy, thyromegaly, JVD, or HJR. Lungs: Normal work of breathing. Clear to auscultation bilaterally  without wheezes or crackles. Heart: Regular rate and rhythm with 2/6 systolic murmur.  No rubs or gallops. Abd: Bowel sounds present. Soft, NT/ND without hepatosplenomegaly Ext: No lower extremity edema. Radial, PT, and DP pulses are 2+ bilaterally. Skin: Warm and dry without rash.  EKG:  NSR without significant abnormality.  Lab Results  Component Value Date   WBC 6.2 07/29/2018   HGB 10.1 (L) 07/29/2018   HCT 32.1 (L) 07/29/2018   MCV 86.1 07/29/2018   PLT 166 07/29/2018    Lab Results  Component Value Date   NA 140 09/22/2018   K 3.6 09/22/2018   CL 105 09/22/2018   CO2 27 09/22/2018   BUN 14 09/22/2018   CREATININE 0.65 09/22/2018   GLUCOSE 98 09/22/2018   ALT 13 07/29/2018    Lab Results  Component Value Date   CHOL 99 05/26/2018   HDL 44.00 05/26/2018   LDLCALC 35 05/26/2018   LDLDIRECT 102.5 07/21/2009   TRIG 99.0 05/26/2018   CHOLHDL 2 05/26/2018    --------------------------------------------------------------------------------------------------  ASSESSMENT AND PLAN: Hypertension: BP upper normal today in the office and often higher at home since deescalation of losartan-HCTZ and discontinuation of atenolol.  We have agreed to increase amlodipine to 10 mg daily.  No other medication changes.  Fatigue: Non-specific and chronic.  Hopefully, improved blood pressure control will improve the fatigue (Ashley Ryan feels like high blood pressure contributes to her symptoms).  We have also discussed the importance of good sleep hygiene.  I have encouraged her to avoid napping in the evenings and to go to bed earlier.  Aortic regurgitation: Mild and unlikely to be contributing to current symptoms.  Continue clinical follow-up and consider repeat echo in 6-12 months.  Follow-up: RTC in 3 months.    Nelva Bush, MD 10/15/2018 8:25 PM

## 2018-10-14 NOTE — Patient Instructions (Signed)
Medication Instructions:  Your physician has recommended you make the following change in your medication:  1- INCREASE Amlodipine 10 mg by mouth once a day.  If you need a refill on your cardiac medications before your next appointment, please call your pharmacy.   Lab work: NONE If you have labs (blood work) drawn today and your tests are completely normal, you will receive your results only by: Marland Kitchen MyChart Message (if you have MyChart) OR . A paper copy in the mail If you have any lab test that is abnormal or we need to change your treatment, we will call you to review the results.  Testing/Procedures: NONE  Follow-Up: At [facility], you and your health needs are our priority.  As part of our continuing mission to provide you with exceptional heart care, we have created designated Provider Care Teams.  These Care Teams include your primary Cardiologist (physician) and Advanced Practice Providers (APPs -  Physician Assistants and Nurse Practitioners) who all work together to provide you with the care you need, when you need it. You will need a follow up appointment in 3 months.   You may see Nelva Bush, MD or one of the following Advanced Practice Providers on your designated Care Team:   Murray Hodgkins, NP Christell Faith, PA-C . Marrianne Mood, PA-C

## 2018-10-15 ENCOUNTER — Encounter: Payer: Self-pay | Admitting: Internal Medicine

## 2018-10-15 DIAGNOSIS — I351 Nonrheumatic aortic (valve) insufficiency: Secondary | ICD-10-CM | POA: Insufficient documentation

## 2018-10-15 DIAGNOSIS — R5382 Chronic fatigue, unspecified: Secondary | ICD-10-CM | POA: Insufficient documentation

## 2018-10-21 ENCOUNTER — Other Ambulatory Visit: Payer: Self-pay

## 2018-10-22 ENCOUNTER — Inpatient Hospital Stay: Payer: Medicare HMO | Attending: Hematology and Oncology

## 2018-10-22 ENCOUNTER — Telehealth: Payer: Self-pay

## 2018-10-22 VITALS — BP 127/69 | HR 86 | Temp 97.5°F | Resp 18

## 2018-10-22 DIAGNOSIS — E538 Deficiency of other specified B group vitamins: Secondary | ICD-10-CM

## 2018-10-22 MED ORDER — CYANOCOBALAMIN 1000 MCG/ML IJ SOLN
1000.0000 ug | Freq: Once | INTRAMUSCULAR | Status: AC
  Administered 2018-10-22: 1000 ug via INTRAMUSCULAR

## 2018-10-22 NOTE — Telephone Encounter (Signed)
The patient was her today for a weight check. Todays weight 150.0 lbs. The patient was made aware.

## 2018-10-23 ENCOUNTER — Telehealth: Payer: Self-pay | Admitting: Cardiovascular Disease

## 2018-10-23 NOTE — Telephone Encounter (Signed)
Patient doesn't recall if she was given the flu shot   Please advise

## 2018-10-26 ENCOUNTER — Other Ambulatory Visit: Payer: Self-pay | Admitting: Family Medicine

## 2018-10-27 ENCOUNTER — Other Ambulatory Visit: Payer: Self-pay | Admitting: *Deleted

## 2018-10-27 DIAGNOSIS — E119 Type 2 diabetes mellitus without complications: Secondary | ICD-10-CM

## 2018-10-27 MED ORDER — TRAMADOL HCL 50 MG PO TABS: 50.0000 mg | ORAL_TABLET | Freq: Four times a day (QID) | ORAL | 0 refills | Status: DC | PRN

## 2018-10-27 MED ORDER — ACCU-CHEK SOFTCLIX LANCETS MISC: 3 refills | Status: DC

## 2018-10-27 MED ORDER — DICLOFENAC SODIUM 1 % TD GEL: 2.0000 g | Freq: Four times a day (QID) | TRANSDERMAL | 0 refills | Status: DC

## 2018-10-27 MED ORDER — GLUCOSE BLOOD VI STRP: ORAL_STRIP | 3 refills | Status: DC

## 2018-10-27 MED ORDER — BD SWAB SINGLE USE REGULAR PADS: MEDICATED_PAD | 3 refills | Status: DC

## 2018-10-27 MED ORDER — ISOSORBIDE MONONITRATE ER 30 MG PO TB24: 30.0000 mg | ORAL_TABLET | Freq: Every day | ORAL | 3 refills | Status: DC

## 2018-10-27 MED ORDER — ACCU-CHEK AVIVA VI SOLN: 0 refills | Status: AC

## 2018-10-27 NOTE — Telephone Encounter (Addendum)
Last office visit 07/31/2018 follow up BP/Heartrate & swollen toe.  Last refilled Tramadol 02/10/2018 for #90 with no refills. Diclofenac Gel 07/31/2018 for 100 g with no refills.  CPE scheduled for 12/15/2018.

## 2018-10-27 NOTE — Telephone Encounter (Signed)
Looked over patient's office visit in [place] and do not see where it is documented that patient received flu shot. Called patient and she verbalized understanding that she did not receive flu shot that day at office visit.

## 2018-11-03 ENCOUNTER — Ambulatory Visit: Payer: Medicare HMO

## 2018-11-17 ENCOUNTER — Ambulatory Visit (INDEPENDENT_AMBULATORY_CARE_PROVIDER_SITE_OTHER): Payer: Medicare HMO

## 2018-11-17 DIAGNOSIS — Z23 Encounter for immunization: Secondary | ICD-10-CM | POA: Diagnosis not present

## 2018-11-18 ENCOUNTER — Other Ambulatory Visit: Payer: Self-pay

## 2018-11-19 ENCOUNTER — Inpatient Hospital Stay: Payer: Medicare HMO

## 2018-11-20 ENCOUNTER — Other Ambulatory Visit: Payer: Self-pay

## 2018-11-20 MED ORDER — GABAPENTIN 100 MG PO CAPS: ORAL_CAPSULE | ORAL | 5 refills | Status: DC

## 2018-11-20 NOTE — Telephone Encounter (Signed)
Patient left message on triage line, stating that she is completely out of Lyrica and needs refill and requests 90 day supply to [facility] pharmacy. LOV 07/31/2018 acute visit Next appointment on 12/31/2018 AWV  Last filled on 03/19/2017 #[id number] with 5 refills

## 2018-11-23 ENCOUNTER — Other Ambulatory Visit: Payer: Self-pay

## 2018-11-23 ENCOUNTER — Inpatient Hospital Stay: Payer: Medicare HMO | Attending: Hematology and Oncology

## 2018-11-23 VITALS — BP 138/81 | HR 76 | Resp 18

## 2018-11-23 DIAGNOSIS — E538 Deficiency of other specified B group vitamins: Secondary | ICD-10-CM | POA: Diagnosis not present

## 2018-11-23 MED ORDER — CYANOCOBALAMIN 1000 MCG/ML IJ SOLN
1000.0000 ug | Freq: Once | INTRAMUSCULAR | Status: AC
  Administered 2018-11-23: 1000 ug via INTRAMUSCULAR

## 2018-11-23 NOTE — Patient Instructions (Signed)

## 2018-11-29 ENCOUNTER — Other Ambulatory Visit: Payer: Self-pay | Admitting: Physician Assistant

## 2018-12-14 ENCOUNTER — Telehealth: Payer: Self-pay | Admitting: Family Medicine

## 2018-12-14 ENCOUNTER — Other Ambulatory Visit: Payer: Self-pay | Admitting: Family Medicine

## 2018-12-14 DIAGNOSIS — E538 Deficiency of other specified B group vitamins: Secondary | ICD-10-CM

## 2018-12-14 DIAGNOSIS — D509 Iron deficiency anemia, unspecified: Secondary | ICD-10-CM

## 2018-12-14 DIAGNOSIS — E1140 Type 2 diabetes mellitus with diabetic neuropathy, unspecified: Secondary | ICD-10-CM

## 2018-12-14 DIAGNOSIS — E7800 Pure hypercholesterolemia, unspecified: Secondary | ICD-10-CM

## 2018-12-14 NOTE — Telephone Encounter (Signed)
-----   Message from Cloyd Stagers, RT sent at 12/14/2018  2:44 PM EST ----- Regarding: Lab Orders for Tuesday 11.24.2020 Please place lab orders for Tuesday 11.23.2020,  physical Thursday 12.10.2020 Thank you, Dyke Maes RT(R)

## 2018-12-15 ENCOUNTER — Encounter: Payer: Medicare HMO | Admitting: Family Medicine

## 2018-12-15 ENCOUNTER — Other Ambulatory Visit (INDEPENDENT_AMBULATORY_CARE_PROVIDER_SITE_OTHER): Payer: Medicare HMO

## 2018-12-15 ENCOUNTER — Other Ambulatory Visit: Payer: Self-pay

## 2018-12-15 ENCOUNTER — Ambulatory Visit: Payer: Medicare HMO

## 2018-12-15 ENCOUNTER — Ambulatory Visit (INDEPENDENT_AMBULATORY_CARE_PROVIDER_SITE_OTHER): Payer: Medicare HMO

## 2018-12-15 VITALS — BP 132/80 | HR 95 | Wt 150.0 lb

## 2018-12-15 DIAGNOSIS — D509 Iron deficiency anemia, unspecified: Secondary | ICD-10-CM | POA: Diagnosis not present

## 2018-12-15 DIAGNOSIS — Z0000 Encounter for general adult medical examination without abnormal findings: Secondary | ICD-10-CM

## 2018-12-15 DIAGNOSIS — E1140 Type 2 diabetes mellitus with diabetic neuropathy, unspecified: Secondary | ICD-10-CM | POA: Diagnosis not present

## 2018-12-15 LAB — COMPREHENSIVE METABOLIC PANEL
ALT: 11 U/L (ref 0–35)
AST: 11 U/L (ref 0–37)
Albumin: 3.5 g/dL (ref 3.5–5.2)
Alkaline Phosphatase: 53 U/L (ref 39–117)
BUN: 13 mg/dL (ref 6–23)
CO2: 26 mEq/L (ref 19–32)
Calcium: 8.7 mg/dL (ref 8.4–10.5)
Chloride: 105 mEq/L (ref 96–112)
Creatinine, Ser: 0.69 mg/dL (ref 0.40–1.20)
GFR: 82.82 mL/min (ref >60.00)
Glucose, Bld: 89 mg/dL (ref 70–99)
Potassium: 3.6 mEq/L (ref 3.5–5.1)
Sodium: 139 mEq/L (ref 135–145)
Total Bilirubin: 0.3 mg/dL (ref 0.2–1.2)
Total Protein: 5.9 g/dL — ABNORMAL LOW (ref 6.0–8.3)

## 2018-12-15 LAB — LIPID PANEL
Cholesterol: 111 mg/dL (ref 0–200)
HDL: 54.8 mg/dL (ref >39.00)
LDL Cholesterol: 43 mg/dL (ref 0–99)
NonHDL: 56.03
Total CHOL/HDL Ratio: 2
Triglycerides: 65 mg/dL (ref 0.0–149.0)
VLDL: 13 mg/dL (ref 0.0–40.0)

## 2018-12-15 LAB — CBC WITH DIFFERENTIAL/PLATELET
Basophils Absolute: 0 10*3/uL (ref 0.0–0.1)
Basophils Relative: 0.6 % (ref 0.0–3.0)
Eosinophils Absolute: 0.1 10*3/uL (ref 0.0–0.7)
Eosinophils Relative: 2.1 % (ref 0.0–5.0)
HCT: 36.3 % (ref 36.0–46.0)
Hemoglobin: 11.5 g/dL — ABNORMAL LOW (ref 12.0–15.0)
Lymphocytes Relative: 24 % (ref 12.0–46.0)
Lymphs Abs: 1.4 10*3/uL (ref 0.7–4.0)
MCHC: 31.7 g/dL (ref 30.0–36.0)
MCV: 84 fl (ref 78.0–100.0)
Monocytes Absolute: 0.6 10*3/uL (ref 0.1–1.0)
Monocytes Relative: 9.8 % (ref 3.0–12.0)
Neutro Abs: 3.6 10*3/uL (ref 1.4–7.7)
Neutrophils Relative %: 63.5 % (ref 43.0–77.0)
Platelets: 183 10*3/uL (ref 150.0–400.0)
RBC: 4.32 Mil/uL (ref 3.87–5.11)
RDW: 16.1 % — ABNORMAL HIGH (ref 11.5–15.5)
WBC: 5.7 10*3/uL (ref 4.0–10.5)

## 2018-12-15 LAB — HEMOGLOBIN A1C: Hgb A1c MFr Bld: 5.9 % (ref 4.6–6.5)

## 2018-12-15 NOTE — Progress Notes (Signed)
Subjective:   Ashley Ryan is a 73 y.o. female who presents for Medicare Annual (Subsequent) preventive examination.  Review of Systems: N/A   This visit is being conducted through telemedicine via telephone at the nurse health advisor's home address due to the COVID-19 pandemic. This patient has given me verbal consent via doximity to conduct this visit, patient states they are participating from their home address. Patient and myself are on the telephone call. There is no referral for this visit. Some vital signs may be absent or patient reported.    Patient identification: identified by name, DOB, and current address   Cardiac Risk Factors include: advanced age (>45mn, >>44women);diabetes mellitus;dyslipidemia;Other (see comment), Risk factor comments: hypercholesterolemia     Objective:     Vitals: BP 132/80   Pulse 95   Wt 150 lb (68 kg)   BMI 29.29 kg/m   Body mass index is 29.29 kg/m.  Advanced Directives 12/15/2018 07/29/2018 11/05/2017 11/03/2017 08/01/2017 07/03/2017 06/13/2017  Does Patient Have a Medical Advance Directive? No No No No No No No  Would patient like information on creating a medical advance directive? No - Patient declined No - Patient declined No - Patient declined - - - No - Patient declined    Tobacco Social History   Tobacco Use  Smoking Status Never Smoker  Smokeless Tobacco Never Used     Counseling given: Not Answered   Clinical Intake:  Pre-visit preparation completed: Yes  Pain : No/denies pain     Nutritional Risks: None Diabetes: Yes CBG done?: No Did pt. bring in CBG monitor from home?: No  How often do you need to have someone help you when you read instructions, pamphlets, or other written materials from your doctor or pharmacy?: 1 - Never What is the last grade level you completed in school?: Business college  Interpreter Needed?: No  Information entered by :: CJohnson, LPN  Past Medical History:  Diagnosis Date  .  Anemia   . Arthritis   . 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  .Marland KitchenChronic headache   . Complication of anesthesia    prior to 1991 used to have PONV.  none recently.  . Diverticulosis of colon   . Family history of adverse reaction to anesthesia    mother/daighter get sick  . GERD with stricture   . Hard of hearing   . Heart murmur    followed by PCP  . Hiatal hernia   . Hypertension   . Neuropathy    feet  . Osteoporosis   . Pre-diabetes   . Wears dentures    full upper   Past Surgical History:  Procedure Laterality Date  . ABDOMINAL HYSTERECTOMY  1978   one ovary remains  . 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 CARDIAC CATHETERIZATION     "yrs ago" all OK.  .Marland KitchenCATARACT EXTRACTION W/PHACO Left 02/21/2016   Procedure: CATARACT EXTRACTION PHACO AND INTRAOCULAR LENS PLACEMENT ([place]  Left [place];  Surgeon: CLeandrew Koyanagi MD;  [place]  Service: Ophthalmology;  Laterality: Left;  Left eye Diabetic  . CATARACT EXTRACTION W/PHACO Right 03/13/2016   Procedure: CATARACT EXTRACTION PHACO AND INTRAOCULAR LENS PLACEMENT ([place]  right  diabetic;  Surgeon: CLeandrew Koyanagi MD;  [place] [facility]  Service: Ophthalmology;  Laterality: Right;  diabetic  .  CHOLECYSTECTOMY  1978  . COLONOSCOPY W/ POLYPECTOMY  09/09/2013   8 mm tubular adenoma of the cecum.  Erskine Emery, MD. [facility]  . LEFT HEART CATH AND CORONARY ANGIOGRAPHY N/A 11/06/2017   Procedure: LEFT HEART CATH AND CORONARY ANGIOGRAPHY;  Surgeon: Nelva Bush, MD;  Location[place] [facility];  Service: Cardiovascular;  Laterality: N/A;  . MASTECTOMY Left 06/13/2017   mastectomy neg margins  . OOPHORECTOMY     one still left  . RENAL ANGIOGRAPHY  11/06/2017   Procedure: RENAL ANGIOGRAPHY;  Surgeon: Nelva Bush,  MD;  Location: [place] [facility];  Service: Cardiovascular;;  . SENTINEL NODE BIOPSY Left 06/13/2017   Procedure: SENTINEL NODE BIOPSY;  Surgeon: Robert Bellow, MD;  Location: [facility];  Service: General;  Laterality: Left;  . 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  Surgeon: Robert Bellow, MD;  Location: [facility];  Service: General;  Laterality: Left;   Family History  Problem Relation Age of Onset  . Breast cancer Other   . Stomach cancer Other   . Diabetes Other   . Heart disease Other   . Breast cancer Sister 58  . Esophageal cancer Brother   . Hypertension Mother   . Heart attack Mother 35  . Hypertension Father   . Coronary artery disease Father 53       CABG  . Stroke Father    Social History   Socioeconomic History  . Marital status: Married    Spouse name: Not on file  . Number of children: 2  . Years of education: Not on file  . Highest education level: Not on file  Occupational History  . Occupation: retired Forensic psychologist: RETIRED  Social Needs  . Financial resource strain: Not hard at all  . Food insecurity    Worry: Never true    Inability: Never true  . Transportation needs    Medical: No    Non-medical: No  Tobacco Use  . Smoking status: Never Smoker  . Smokeless tobacco: Never Used  Substance and Sexual Activity  . Alcohol use: No  . Drug use: No  . Sexual activity: Not Currently  Lifestyle  . Physical activity    Days per week: 0 days    Minutes per session: 0 min  . Stress: Not at all  Relationships  . Social Herbalist on phone: Not on file    Gets together: Not on file    Attends religious service: Not on file    Active member of club or organization: Not on file    Attends meetings of clubs or organizations: Not on file    Relationship status: Not on file  Other Topics Concern  . Not on file  Social History Narrative   Drinks sweet tea,  diet sodas 4 a day   No living will, full code (reviewed 2014)    Outpatient Encounter Medications as of 12/15/2018  Medication Sig  . Accu-Chek Softclix Lancets lancets Use to check blood sugar once daily  . Alcohol Swabs (B-D SINGLE USE SWABS REGULAR) PADS Use to check blood sugar once daily  . amLODipine (NORVASC) 10 MG tablet Take 1 tablet (10 mg total) by mouth daily.  Marland Kitchen aspirin EC 81 MG tablet Take 81 mg by mouth daily.   Marland Kitchen atorvastatin (LIPITOR) 40 MG tablet TAKE 1 TABLET AT BEDTIME  . Blood Glucose  Calibration (ACCU-CHEK AVIVA) SOLN Use to check blood sugar once daily  . Blood Glucose Monitoring Suppl (ACCU-CHEK AVIVA PLUS) w/Device KIT Use to check blood sugar once daily  . Calcium Carbonate-Vitamin D (CALCIUM 600+D) 600-400 MG-UNIT per tablet Take 1 tablet by mouth 2 (two) times daily.   . diclofenac sodium (VOLTAREN) 1 % GEL Apply 2 g topically 4 (four) times daily.  Marland Kitchen esomeprazole (NEXIUM) 40 MG capsule TAKE 1 CAPSULE 2 (TWO) TIMES DAILY BEFORE A MEAL.  Marland Kitchen FLUoxetine (PROZAC) 20 MG capsule TAKE 1 CAPSULE EVERY DAY  . fluticasone (FLONASE) 50 MCG/ACT nasal spray USE 2 SPRAYS INTO THE NOSE DAILY AS DIRECTED (SUBSTITUTED FOR FLONASE)  . furosemide (LASIX) 20 MG tablet TAKE 1 TABLET BY MOUTH EVERY DAY AS NEEDED  . gabapentin (NEURONTIN) 100 MG capsule TAKE 1 CAPSULE IN THE MORNING, 1 CAPSULE AT LUNCH, 1 CAPSULES AT DINNER AND 2 TO 3 CAPSULES AT BEDTIME  . Glucosamine 500 MG TABS Take 500 mg by mouth daily.   Marland Kitchen glucose blood (ACCU-CHEK AVIVA) test strip Use to check blood sugar once daily  . isosorbide mononitrate (IMDUR) 30 MG 24 hr tablet Take 1 tablet (30 mg total) by mouth daily.  Marland Kitchen latanoprost (XALATAN) 0.005 % ophthalmic solution Place 1 drop into both eyes at bedtime.  . lidocaine (LIDODERM) 5 % APPLY 1 PATCH ONTO THE SKIN EVERY DAY. REMOVE AND DISCARD PATCH WITHIN 12 HOURS OR AS DIRECTED BY PHYSICIAN.  Marland Kitchen loratadine (CLARITIN) 10 MG tablet Take 10 mg by mouth daily.  Marland Kitchen  losartan-hydrochlorothiazide (HYZAAR) 100-25 MG tablet TAKE 1 TABLET EVERY DAY  . Omega-3 Fatty Acids (FISH OIL) 1000 MG CAPS Take 1,000 mg by mouth daily.   . polycarbophil (FIBERCON) 625 MG tablet Take 625 mg by mouth daily.  . potassium chloride (K-DUR) 10 MEQ tablet Take 1 tablet (10 mEq total) by mouth daily. While on lasix  . tamoxifen (NOLVADEX) 20 MG tablet Take 1 tablet (20 mg total) by mouth daily.  . traMADol (ULTRAM) 50 MG tablet Take 1 tablet (50 mg total) by mouth every 6 (six) hours as needed. for pain  . cloNIDine (CATAPRES) 0.1 MG tablet Take 1 tablet (0.1 mg total) by mouth 2 (two) times daily as needed (Systolic blood pressure greater than 180).   No facility-administered encounter medications on file as of 12/15/2018.     Activities of Daily Living In your present state of health, do you have any difficulty performing the following activities: 12/15/2018  Hearing? Y  Comment wears hearing aids  Vision? N  Difficulty concentrating or making decisions? N  Walking or climbing stairs? Y  Comment has to go very slow  Dressing or bathing? N  Doing errands, shopping? N  Preparing Food and eating ? N  Using the Toilet? N  In the past six months, have you accidently leaked urine? Y  Comment wears pads during the day, pull up at night  Do you have problems with loss of bowel control? Y  Comment wears pads during the day, pull up at night  Managing your Medications? N  Managing your Finances? N  Housekeeping or managing your Housekeeping? N  Some recent data might be hidden    Patient Care Team: [name], MD as PCP - General End, Harrell Gave, MD as PCP - Cardiology (Cardiology) Leandrew Koyanagi, MD as Referring Physician (Ophthalmology) Bary Castilla Forest Gleason, MD (General Surgery)    Assessment:   This is a routine wellness examination for Miche.  Exercise Activities and  Dietary recommendations Current Exercise Habits: The patient does not participate in  regular exercise at present, Exercise limited by: None identified  Goals    . Increase physical activity     Starting 11/12/2016, I will continue to exercise for at least 15 min daily.     . Patient Stated     12/15/2018, I will try to start exercising more daily.        Fall Risk Fall Risk  12/15/2018 05/26/2018 11/12/2016 11/09/2015 11/10/2014  Falls in the past year? 0 0 Yes Yes Yes  Number falls in past yr: 0 0 2 or more 2 or more 2 or more  Injury with Fall? 0 0 Yes No Yes  Risk Factor Category  - - High Fall Risk High Fall Risk High Fall Risk  Risk for fall due to : Medication side effect;Impaired balance/gait - Impaired balance/gait History of fall(s);Other (Comment) History of fall(s)  Risk for fall due to: Comment - - - pt has neuropathy in both feet -  Follow up Falls evaluation completed;Falls prevention discussed - - Falls evaluation completed;Falls prevention discussed -   Is the patient's home free of loose throw rugs in walkways, pet beds, electrical cords, etc?   yes      Grab bars in the bathroom? yes      Handrails on the stairs?   yes      Adequate lighting?   yes  Timed Get Up and Go performed: N/A  Depression Screen PHQ 2/9 Scores 12/15/2018 11/12/2016 11/09/2015 11/10/2014  PHQ - 2 Score 0 0 0 0  PHQ- 9 Score 0 0 - -     Cognitive Function MMSE - Mini Mental State Exam 12/15/2018 11/12/2016 11/09/2015  Orientation to time '5 5 5  '$ Orientation to Place '5 5 5  '$ Registration '3 3 3  '$ Attention/ Calculation 5 0 0  Recall '3 3 2  '$ Recall-comments - - pt was unable to recall 1 of 3 words  Language- name 2 objects - 0 0  Language- repeat '1 1 1  '$ Language- follow 3 step command - 3 3  Language- read & follow direction - 0 0  Write a sentence - 0 0  Copy design - 0 0  Total score - 20 19  Mini Cog  Mini-Cog screen was completed. Maximum score is 22. A value of 0 denotes this part of the MMSE was not completed or the patient failed this part of the Mini-Cog  screening.      Immunization History  Administered Date(s) Administered  . Fluad Quad(high Dose 65+) 11/17/2018  . Influenza Split 10/08/2010, 10/15/2011  . Influenza Whole 11/03/2008, 11/16/2009  . Influenza,inj,Quad PF,6+ Mos 10/29/2013, 11/01/2014, 11/09/2015, 12/05/2016, 10/30/2017  . PPD Test 05/03/2011, 05/04/2012, 06/06/2015  . Pneumococcal Conjugate-13 10/29/2013  . Pneumococcal Polysaccharide-23 12/22/2007, 11/09/2015  . Td 01/22/2007  . Tdap 10/15/2011    Qualifies for Shingles Vaccine?yes  Screening Tests Health Maintenance  Topic Date Due  . FOOT EXAM  12/05/2017  . OPHTHALMOLOGY EXAM  04/23/2018  . HEMOGLOBIN A1C  11/26/2018  . COLONOSCOPY  12/15/2019 (Originally 09/10/2018)  . TETANUS/TDAP  10/14/2021  . INFLUENZA VACCINE  Completed  . DEXA SCAN  Completed  . Hepatitis C Screening  Completed  . PNA vac Low Risk Adult  Completed    Cancer Screenings: Lung: Low Dose CT Chest recommended if Age 67-80 years, 30 pack-year currently smoking OR have quit w/in 15years. Patient does not qualify. Breast:  Up to date on Mammogram?  Yes, completed 08/03/2018   Up to date of Bone Density/Dexa? Yes, completed 06/02/2017 Colorectal: decline at this time due to pandemic  Additional Screenings:  Hepatitis C Screening: N/A     Plan:    Patient will try to exercise more daily.    I have personally reviewed and noted the following in the patient's chart:   . Medical and social history . Use of alcohol, tobacco or illicit drugs  . Current medications and supplements . Functional ability and status . Nutritional status . Physical activity . Advanced directives . List of other physicians . Hospitalizations, surgeries, and ER visits in previous 12 months . Vitals . Screenings to include cognitive, depression, and falls . Referrals and appointments  In addition, I have reviewed and discussed with patient certain preventive protocols, quality metrics, and best practice  recommendations. A written personalized care plan for preventive services as well as general preventive health recommendations were provided to patient.     Andrez Grime, LPN  70/11/32

## 2018-12-15 NOTE — Progress Notes (Signed)
PCP notes:  Health Maintenance: Declined colonoscopy due to pandemic Declined shingrix Will schedule eye exam   Abnormal Screenings: none   Patient concerns: Patient wants to discuss options for urinary incontinence.    Nurse concerns: none   Next PCP appt.: 12/31/2018 @ 10 am

## 2018-12-15 NOTE — Patient Instructions (Signed)
Ashley Ryan , Thank you for taking time to come for your Medicare Wellness Visit. I appreciate your ongoing commitment to your health goals. Please review the following plan we discussed and let me know if I can assist you in the future.   Screening recommendations/referrals: Colonoscopy: declined at this time due to pandemic Mammogram: Up to date, completed 08/03/2018 Bone Density: Up to date, completed 06/02/2017 Recommended yearly ophthalmology/optometry visit for glaucoma screening and checkup Recommended yearly dental visit for hygiene and checkup  Vaccinations: Influenza vaccine: Up to date, completed 11/17/2018 Pneumococcal vaccine: Completed series Tdap vaccine: Up to date, completed 10/15/2011 Shingles vaccine: declined    Advanced directives: Advance directive discussed with you today. Even though you declined this today please call our office should you change your mind and we can give you the proper paperwork for you to fill out.  Conditions/risks identified: diabetes, hypertension, hypercholesterolemia  Next appointment: 12/31/2018 @ 10 am    Preventive Care 65 Years and Older, Female Preventive care refers to lifestyle choices and visits with your health care provider that can promote health and wellness. What does preventive care include?  A yearly physical exam. This is also called an annual well check.  Dental exams once or twice a year.  Routine eye exams. Ask your health care provider how often you should have your eyes checked.  Personal lifestyle choices, including:  Daily care of your teeth and gums.  Regular physical activity.  Eating a healthy diet.  Avoiding tobacco and drug use.  Limiting alcohol use.  Practicing safe sex.  Taking low-dose aspirin every day.  Taking vitamin and mineral supplements as recommended by your health care provider. What happens during an annual well check? The services and screenings done by your health care provider  during your annual well check will depend on your age, overall health, lifestyle risk factors, and family history of disease. Counseling  Your health care provider may ask you questions about your:  Alcohol use.  Tobacco use.  Drug use.  Emotional well-being.  Home and relationship well-being.  Sexual activity.  Eating habits.  History of falls.  Memory and ability to understand (cognition).  Work and work Statistician.  Reproductive health. Screening  You may have the following tests or measurements:  Height, weight, and BMI.  Blood pressure.  Lipid and cholesterol levels. These may be checked every 5 years, or more frequently if you are over 89 years old.  Skin check.  Lung cancer screening. You may have this screening every year starting at age 58 if you have a 30-pack-year history of smoking and currently smoke or have quit within the past 15 years.  Fecal occult blood test (FOBT) of the stool. You may have this test every year starting at age 45.  Flexible sigmoidoscopy or colonoscopy. You may have a sigmoidoscopy every 5 years or a colonoscopy every 10 years starting at age 60.  Hepatitis C blood test.  Hepatitis B blood test.  Sexually transmitted disease (STD) testing.  Diabetes screening. This is done by checking your blood sugar (glucose) after you have not eaten for a while (fasting). You may have this done every 1-3 years.  Bone density scan. This is done to screen for osteoporosis. You may have this done starting at age 68.  Mammogram. This may be done every 1-2 years. Talk to your health care provider about how often you should have regular mammograms. Talk with your health care provider about your test results, treatment options, and  if necessary, the need for more tests. Vaccines  Your health care provider may recommend certain vaccines, such as:  Influenza vaccine. This is recommended every year.  Tetanus, diphtheria, and acellular pertussis  (Tdap, Td) vaccine. You may need a Td booster every 10 years.  Zoster vaccine. You may need this after age 66.  Pneumococcal 13-valent conjugate (PCV13) vaccine. One dose is recommended after age 69.  Pneumococcal polysaccharide (PPSV23) vaccine. One dose is recommended after age 66. Talk to your health care provider about which screenings and vaccines you need and how often you need them. This information is not intended to replace advice given to you by your health care provider. Make sure you discuss any questions you have with your health care provider. Document Released: 02/03/2015 Document Revised: 09/27/2015 Document Reviewed: 11/08/2014 Elsevier Interactive Patient Education  2017 [place] Prevention in the Home Falls can cause injuries. They can happen to people of all ages. There are many things you can do to make your home safe and to help prevent falls. What can I do on the outside of my home?  Regularly fix the edges of walkways and driveways and fix any cracks.  Remove anything that might make you trip as you walk through a door, such as a raised step or threshold.  Trim any bushes or trees on the path to your home.  Use bright outdoor lighting.  Clear any walking paths of anything that might make someone trip, such as rocks or tools.  Regularly check to see if handrails are loose or broken. Make sure that both sides of any steps have handrails.  Any raised decks and porches should have guardrails on the edges.  Have any leaves, snow, or ice cleared regularly.  Use sand or salt on walking paths during winter.  Clean up any spills in your garage right away. This includes oil or grease spills. What can I do in the bathroom?  Use night lights.  Install grab bars by the toilet and in the tub and shower. Do not use towel bars as grab bars.  Use non-skid mats or decals in the tub or shower.  If you need to sit down in the shower, use a plastic, non-slip  stool.  Keep the floor dry. Clean up any water that spills on the floor as soon as it happens.  Remove soap buildup in the tub or shower regularly.  Attach bath mats securely with double-sided non-slip rug tape.  Do not have throw rugs and other things on the floor that can make you trip. What can I do in the bedroom?  Use night lights.  Make sure that you have a light by your bed that is easy to reach.  Do not use any sheets or blankets that are too big for your bed. They should not hang down onto the floor.  Have a firm chair that has side arms. You can use this for support while you get dressed.  Do not have throw rugs and other things on the floor that can make you trip. What can I do in the kitchen?  Clean up any spills right away.  Avoid walking on wet floors.  Keep items that you use a lot in easy-to-reach places.  If you need to reach something above you, use a strong step stool that has a grab bar.  Keep electrical cords out of the way.  Do not use floor polish or wax that makes floors slippery. If you  must use wax, use non-skid floor wax.  Do not have throw rugs and other things on the floor that can make you trip. What can I do with my stairs?  Do not leave any items on the stairs.  Make sure that there are handrails on both sides of the stairs and use them. Fix handrails that are broken or loose. Make sure that handrails are as long as the stairways.  Check any carpeting to make sure that it is firmly attached to the stairs. Fix any carpet that is loose or worn.  Avoid having throw rugs at the top or bottom of the stairs. If you do have throw rugs, attach them to the floor with carpet tape.  Make sure that you have a light switch at the top of the stairs and the bottom of the stairs. If you do not have them, ask someone to add them for you. What else can I do to help prevent falls?  Wear shoes that:  Do not have high heels.  Have rubber bottoms.  Are  comfortable and fit you well.  Are closed at the toe. Do not wear sandals.  If you use a stepladder:  Make sure that it is fully opened. Do not climb a closed stepladder.  Make sure that both sides of the stepladder are locked into place.  Ask someone to hold it for you, if possible.  Clearly mark and make sure that you can see:  Any grab bars or handrails.  First and last steps.  Where the edge of each step is.  Use tools that help you move around (mobility aids) if they are needed. These include:  Canes.  Walkers.  Scooters.  Crutches.  Turn on the lights when you go into a dark area. Replace any light bulbs as soon as they burn out.  Set up your furniture so you have a clear path. Avoid moving your furniture around.  If any of your floors are uneven, fix them.  If there are any pets around you, be aware of where they are.  Review your medicines with your doctor. Some medicines can make you feel dizzy. This can increase your chance of falling. Ask your doctor what other things that you can do to help prevent falls. This information is not intended to replace advice given to you by your health care provider. Make sure you discuss any questions you have with your health care provider. Document Released: 11/03/2008 Document Revised: 06/15/2015 Document Reviewed: 02/11/2014 Elsevier Interactive Patient Education  2017 [employer].

## 2018-12-16 ENCOUNTER — Inpatient Hospital Stay: Payer: Medicare HMO

## 2018-12-22 NOTE — Progress Notes (Signed)
No critical labs need to be addressed urgently. We will discuss labs in detail at upcoming office visit.   

## 2018-12-23 ENCOUNTER — Other Ambulatory Visit: Payer: Self-pay

## 2018-12-23 ENCOUNTER — Ambulatory Visit: Payer: Medicare HMO | Admitting: Internal Medicine

## 2018-12-24 ENCOUNTER — Inpatient Hospital Stay: Payer: Medicare HMO | Attending: Hematology and Oncology

## 2018-12-24 VITALS — BP 132/78 | HR 78

## 2018-12-24 DIAGNOSIS — E538 Deficiency of other specified B group vitamins: Secondary | ICD-10-CM | POA: Diagnosis not present

## 2018-12-24 MED ORDER — CYANOCOBALAMIN 1000 MCG/ML IJ SOLN
1000.0000 ug | Freq: Once | INTRAMUSCULAR | Status: AC
  Administered 2018-12-24: 1000 ug via INTRAMUSCULAR

## 2018-12-24 NOTE — Progress Notes (Signed)
Cardiology Office Note    Date:  01/01/2019   ID:  Ashley Ryan, Ashley Ryan 1945-07-29, MRN [id number]  PCP:  Ashley Sanders, MD  Cardiologist:  Ashley Bush, MD  Electrophysiologist:  None   Chief Complaint: Follow up  History of Present Illness:   Ashley Ryan is a 73 y.o. female with history of nonobstructive CAD by [place] in 10/2017, HFpEF, pulmonary hypertension, prediabetes, anemia, HTN, hiatal hernia, GERD, and arthritis who presents forfollow-up of  HTN.  Patient was seen by Dr. Saunders Ryan on 11/05/2017 for evaluation of chest pain and elevated BP. It was noted at that time she had recently been seen in the ED with BP readings in the 409-811 mmHg systolic range with associated chest tightness. She reported a remote cath 5-10 years prior that did not show any significant disease, per her report. BP was 138/70. She was noted to have ongoing chest pain at her visit with [place] on 11/05/2017 and was admitted from the office and ruled out. She underwent diagnostic LHC on 11/06/2017 that showed no angiographically significant CAD with mildly elevated LV filling pressures, consistent with diastolic dysfunction. She also underwent renal angiography which showed normal renal arteries. Echo on 11/06/2017 showed an EF of 55-60%, moderate LVH, no RWMA, Gr1DD, trivial AI, mildly dilated left atrium, RV cavity size was normal with normal RVSF, PASP 35-40 mmHg.  Following this, her BP was running in the low 914N to 829F systolic.    She was seen in 09/2018 with soft blood pressures and fatigue leading to the decreasing of losartan/HCTZ with improvement in symptoms and BP.  She was last seen in the office in late 09/2018 noting worsening fatigue again along with BPs in the 621H to 086V systolic.  Amlodipine was increased to 10 mg daily.  It was noted the patient had poor sleep hygiene.  She comes in doing very well today.  She denies any chest, palpitations, dizziness, presyncope, syncope.  She has remained on  amlodipine 5 mg daily along with Hyzaar 50/12.5 mg daily, and as needed Lasix and clonidine.  She has not needed any clonidine.  She takes Lasix approximately once to twice per week and last took this on 12/10 secondary to mild lower extremity swelling.  BP typically runs in the 140s to rare 784O systolic prior to medications and into the 962X systolic after medications.  She does have better sleep hygiene.  She drinks approximately half a can of soda daily.  Weight has remained stable.  She does not have any issues or concerns at this time.   Labs: 11/2018 - total cholesterol 111, triglycerides 65, HDL 54, LDL 43, potassium 3.6, BUN 13, serum creatinine 0.69, albumin 3.5, AST/ALT normal, Hgb 11.5, PLT 183, A1c 5.9   Past Medical History:  Diagnosis Date  . Anemia   . Arthritis   . 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  Mastectomy/ SLN.  Marland Kitchen Chronic headache   . Complication of anesthesia    prior to 1991 used to have PONV.  none recently.  . Diverticulosis of colon   . Family history of adverse reaction to anesthesia    mother/daighter get sick  . GERD with stricture   . Hard of hearing   . Heart murmur    followed by PCP  . Hiatal hernia   . Hypertension   . Neuropathy    feet  . Osteoporosis   . Pre-diabetes   . Wears  dentures    full upper    Past Surgical History:  Procedure Laterality Date  . ABDOMINAL HYSTERECTOMY  1978   one ovary remains  . 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 CARDIAC CATHETERIZATION     "yrs ago" all OK.  Marland Kitchen CATARACT EXTRACTION W/PHACO Left 02/21/2016   Procedure: CATARACT EXTRACTION PHACO AND INTRAOCULAR LENS PLACEMENT ([place])  Left eye;  Surgeon: Ashley Koyanagi, MD;  Location: [facility];  Service: Ophthalmology;  Laterality: Left;  Left eye Diabetic  . CATARACT EXTRACTION W/PHACO Right  03/13/2016   Procedure: CATARACT EXTRACTION PHACO AND INTRAOCULAR LENS PLACEMENT ([place])  right  diabetic;  Surgeon: Ashley Koyanagi, MD;  Location: [facility];  Service: Ophthalmology;  Laterality: Right;  diabetic  . CHOLECYSTECTOMY  1978  . COLONOSCOPY W/ POLYPECTOMY  09/09/2013   8 mm tubular adenoma of the cecum.  Ashley Emery, MD. [facility]  . LEFT HEART CATH AND CORONARY ANGIOGRAPHY N/A 11/06/2017   Procedure: LEFT HEART CATH AND CORONARY ANGIOGRAPHY;  Surgeon: Ashley Bush, MD;  Location: [facility];  Service: Cardiovascular;  Laterality: N/A;  . MASTECTOMY Left 06/13/2017   mastectomy neg margins  . OOPHORECTOMY     one still left  . RENAL ANGIOGRAPHY  11/06/2017   Procedure: RENAL ANGIOGRAPHY;  Surgeon: Ashley Bush, MD;  Location: [facility];  Service: Cardiovascular;;  . SENTINEL NODE BIOPSY Left 06/13/2017   Procedure: SENTINEL NODE BIOPSY;  Surgeon: Ashley Bellow, MD;  Location: [facility];  Service: General;  Laterality: Left;  . 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  Surgeon: Ashley Bellow, MD;  Location: [facility];  Service: General;  Laterality: Left;    Current Medications: Current Meds  Medication Sig  . Accu-Chek Softclix Lancets lancets Use to check blood sugar once daily  . Alcohol Swabs (B-D SINGLE USE SWABS REGULAR) PADS Use to check blood sugar once daily  . amLODipine (NORVASC) 5 MG tablet Take 1 tablet by mouth daily.  Marland Kitchen aspirin EC 81 MG tablet Take 81 mg by mouth daily.   Marland Kitchen atorvastatin (LIPITOR) 40 MG tablet TAKE 1 TABLET AT BEDTIME  . Blood Glucose Calibration (ACCU-CHEK AVIVA) SOLN Use to check blood sugar once daily  . Blood Glucose Monitoring Suppl (ACCU-CHEK AVIVA PLUS) w/Device KIT Use to check blood sugar once daily  . Calcium Carbonate-Vitamin D (CALCIUM 600+D) 600-400 MG-UNIT per tablet Take 1 tablet by mouth 2 (two) times  daily.   . cloNIDine (CATAPRES) 0.1 MG tablet Take 1 tablet (0.1 mg total) by mouth 2 (two) times daily as needed (Systolic blood pressure greater than 180).  . diclofenac sodium (VOLTAREN) 1 % GEL Apply 2 g topically 4 (four) times daily.  Marland Kitchen esomeprazole (NEXIUM) 40 MG capsule TAKE 1 CAPSULE 2 (TWO) TIMES DAILY BEFORE A MEAL.  Marland Kitchen FLUoxetine (PROZAC) 20 MG capsule TAKE 1 CAPSULE EVERY DAY  . fluticasone (FLONASE) 50 MCG/ACT nasal spray USE 2 SPRAYS INTO THE NOSE DAILY AS DIRECTED (SUBSTITUTED FOR FLONASE)  . furosemide (LASIX) 20 MG tablet TAKE 1 TABLET BY MOUTH EVERY DAY AS NEEDED  . gabapentin (NEURONTIN) 100 MG capsule TAKE 1 CAPSULE IN THE MORNING, 1 CAPSULE AT LUNCH, 1 CAPSULES AT DINNER AND 2 TO 3 CAPSULES AT BEDTIME  . Glucosamine 500 MG TABS Take 500 mg by mouth  daily.   . glucose blood (ACCU-CHEK AVIVA) test strip Use to check blood sugar once daily  . isosorbide mononitrate (IMDUR) 30 MG 24 hr tablet Take 1 tablet (30 mg total) by mouth daily.  Marland Kitchen latanoprost (XALATAN) 0.005 % ophthalmic solution Place 1 drop into both eyes at bedtime.  . lidocaine (LIDODERM) 5 % APPLY 1 PATCH ONTO THE SKIN EVERY DAY. REMOVE AND DISCARD PATCH WITHIN 12 HOURS OR AS DIRECTED BY PHYSICIAN.  Marland Kitchen loratadine (CLARITIN) 10 MG tablet Take 10 mg by mouth daily.  Marland Kitchen losartan-hydrochlorothiazide (HYZAAR) 100-25 MG tablet TAKE 1 TABLET EVERY DAY (Patient taking differently: Take 0.5 tablets by mouth daily. )  . Omega-3 Fatty Acids (FISH OIL) 1000 MG CAPS Take 1,000 mg by mouth daily.   . polycarbophil (FIBERCON) 625 MG tablet Take 625 mg by mouth daily.  . potassium chloride (K-DUR) 10 MEQ tablet Take 1 tablet (10 mEq total) by mouth daily. While on lasix  . tamoxifen (NOLVADEX) 20 MG tablet Take 1 tablet (20 mg total) by mouth daily.  Marland Kitchen tolterodine (DETROL LA) 4 MG 24 hr capsule Take 1 capsule (4 mg total) by mouth daily.  . traMADol (ULTRAM) 50 MG tablet Take 1 tablet (50 mg total) by mouth every 6 (six) hours as  needed. for pain    Allergies:   Sulfa antibiotics and Sulfonamide derivatives   Social History   Socioeconomic History  . Marital status: Married    Spouse name: Not on file  . Number of children: 2  . Years of education: Not on file  . Highest education level: Not on file  Occupational History  . Occupation: retired Forensic psychologist: RETIRED  Tobacco Use  . Smoking status: Never Smoker  . Smokeless tobacco: Never Used  Substance and Sexual Activity  . Alcohol use: No  . Drug use: No  . Sexual activity: Not Currently  Other Topics Concern  . Not on file  Social History Narrative   Drinks sweet tea, diet sodas 4 a day   No living will, full code (reviewed 2014)   Social Determinants of Health   Financial Resource Strain: Low Risk   . Difficulty of Paying Living Expenses: Not hard at all  Food Insecurity: No Food Insecurity  . Worried About Charity fundraiser in the Last Year: Never true  . Ran Out of Food in the Last Year: Never true  Transportation Needs: No Transportation Needs  . Lack of Transportation (Medical): No  . Lack of Transportation (Non-Medical): No  Physical Activity: Inactive  . Days of Exercise per Week: 0 days  . Minutes of Exercise per Session: 0 min  Stress: No Stress Concern Present  . Feeling of Stress : Not at all  Social Connections:   . Frequency of Communication with Friends and Family: Not on file  . Frequency of Social Gatherings with Friends and Family: Not on file  . Attends Religious Services: Not on file  . Active Member of Clubs or Organizations: Not on file  . Attends Archivist Meetings: Not on file  . Marital Status: Not on file     Family History:  The patient's family history includes Breast cancer in an other family member; Breast cancer (age of onset: 98) in her sister; Coronary artery disease (age of onset: 74) in her father; Diabetes in an other family member; Esophageal cancer in her brother; Heart attack (age  of onset: 50) in her mother; Heart disease in an other  family member; Hypertension in her father and mother; Stomach cancer in an other family member; Stroke in her father.  ROS:   Review of Systems  Constitutional: Positive for malaise/fatigue. Negative for chills, diaphoresis, fever and weight loss.       Improved fatigue  HENT: Negative for congestion.   Eyes: Negative for discharge and redness.  Respiratory: Negative for cough, shortness of breath and wheezing.   Cardiovascular: Positive for leg swelling. Negative for chest pain, palpitations, orthopnea, claudication and PND.  Gastrointestinal: Negative for abdominal pain, heartburn, nausea and vomiting.  Musculoskeletal: Negative for falls and myalgias.  Skin: Negative for rash.  Neurological: Negative for dizziness, tingling, tremors, sensory change, speech change, focal weakness, loss of consciousness and weakness.  Psychiatric/Behavioral: Negative for substance abuse. The patient is not nervous/anxious.   All other systems reviewed and are negative.    EKGs/Labs/Other Studies Reviewed:    Studies reviewed were summarized above. The additional studies were reviewed today: As above.   EKG:  EKG is ordered today.  The EKG ordered today demonstrates NSR, 90 bpm, no acute ST-T changes  Recent Labs: 12/15/2018: ALT 11; BUN 13; Creatinine, Ser 0.69; Hemoglobin 11.5; Platelets 183.0; Potassium 3.6; Sodium 139  Recent Lipid Panel    Component Value Date/Time   CHOL 111 12/15/2018 1129   TRIG 65.0 12/15/2018 1129   HDL 54.80 12/15/2018 1129   CHOLHDL 2 12/15/2018 1129   VLDL 13.0 12/15/2018 1129   LDLCALC 43 12/15/2018 1129   LDLDIRECT 102.5 07/21/2009 0829    PHYSICAL EXAM:    VS:  BP 130/80 (BP Location: Left Arm, Patient Position: Sitting, Cuff Size: Normal)   Pulse 90   Temp 99.1 F (37.3 C)   Ht 5' (1.524 m)   Wt 150 lb 4 oz (68.2 kg)   SpO2 96%   BMI 29.34 kg/m   BMI: Body mass index is 29.34 kg/m.  Physical  Exam  Constitutional: She is oriented to person, place, and time. She appears well-developed and well-nourished.  HENT:  Head: Normocephalic and atraumatic.  Eyes: Right eye exhibits no discharge. Left eye exhibits no discharge.  Neck: No JVD present.  Cardiovascular: Normal rate, regular rhythm, S1 normal and S2 normal. Exam reveals no distant heart sounds, no friction rub, no midsystolic click and no opening snap.  Murmur heard. High-pitched blowing decrescendo early diastolic murmur is present with a grade of 1/6 at the upper right sternal border radiating to the apex. Pulses:      Posterior tibial pulses are 2+ on the right side and 2+ on the left side.  Pulmonary/Chest: Effort normal and breath sounds normal. No respiratory distress. She has no decreased breath sounds. She has no wheezes. She has no rales. She exhibits no tenderness.  Abdominal: Soft. She exhibits no distension. There is no abdominal tenderness.  Musculoskeletal:        General: Edema present.     Cervical back: Normal range of motion.     Comments: Trace bilateral pretibial edema  Neurological: She is alert and oriented to person, place, and time.  Skin: Skin is warm and dry. No cyanosis. Nails show no clubbing.  Psychiatric: She has a normal mood and affect. Her speech is normal and behavior is normal. Judgment and thought content normal.    Wt Readings from Last 3 Encounters:  01/01/19 150 lb 4 oz (68.2 kg)  12/31/18 151 lb 8 oz (68.7 kg)  12/15/18 150 lb (68 kg)     ASSESSMENT & PLAN:  1. HTN: Blood pressure reasonably controlled.  Titrate amlodipine to 7.5 mg daily.  She was advised to notify us if this titration needs to increased lower extremity swelling.  Continue current doses of Imdur, and Hyzaar along with as needed clonidine and Lasix.  Low-sodium diet and sleep hygiene recommended.  2. Nonobstructive CAD: She is doing well without any symptoms concerning for angina.  Continue current medications  including aspirin, amlodipine, and Imdur.  LDL less than 70.  Remains on atorvastatin.  3. HFpEF/pulmonary hypertension: Euvolemic and well compensated.  Weight stable.  Continue as needed Lasix.  4. Fatigue: Improved with better sleep hygiene.  Likely multifactorial including poor sleep hygiene, physical deconditioning, and underlying anemia.  Less likely related to clonidine as she has not needed this recently.  5. Aortic regurgitation: Follow-up echo in approximately 3 to 9 months.  6. Anemia: Stable.  Follow-up with PCP as directed.  Disposition: F/u with Dr. Saunders Ryan or an APP in 3 months.   Medication Adjustments/Labs and Tests Ordered: Current medicines are reviewed at length with the patient today.  Concerns regarding medicines are outlined above. Medication changes, Labs and Tests ordered today are summarized above and listed in the Patient Instructions accessible in Encounters.   Signed, Christell Faith, PA-C 01/01/2019 8:08 AM     [facility] [address] [place], [place] [address] [phone]

## 2018-12-28 LAB — HM DIABETES FOOT EXAM

## 2018-12-31 ENCOUNTER — Other Ambulatory Visit: Payer: Self-pay

## 2018-12-31 ENCOUNTER — Ambulatory Visit (INDEPENDENT_AMBULATORY_CARE_PROVIDER_SITE_OTHER): Payer: Medicare HMO | Admitting: Family Medicine

## 2018-12-31 ENCOUNTER — Encounter: Payer: Self-pay | Admitting: Family Medicine

## 2018-12-31 VITALS — BP 130/68 | HR 96 | Temp 98.0°F | Ht 60.0 in | Wt 151.5 lb

## 2018-12-31 DIAGNOSIS — Z0000 Encounter for general adult medical examination without abnormal findings: Secondary | ICD-10-CM | POA: Diagnosis not present

## 2018-12-31 DIAGNOSIS — F411 Generalized anxiety disorder: Secondary | ICD-10-CM | POA: Diagnosis not present

## 2018-12-31 DIAGNOSIS — E785 Hyperlipidemia, unspecified: Secondary | ICD-10-CM

## 2018-12-31 DIAGNOSIS — D509 Iron deficiency anemia, unspecified: Secondary | ICD-10-CM | POA: Diagnosis not present

## 2018-12-31 DIAGNOSIS — D0512 Intraductal carcinoma in situ of left breast: Secondary | ICD-10-CM | POA: Diagnosis not present

## 2018-12-31 DIAGNOSIS — E1151 Type 2 diabetes mellitus with diabetic peripheral angiopathy without gangrene: Secondary | ICD-10-CM | POA: Diagnosis not present

## 2018-12-31 DIAGNOSIS — I10 Essential (primary) hypertension: Secondary | ICD-10-CM

## 2018-12-31 DIAGNOSIS — E1169 Type 2 diabetes mellitus with other specified complication: Secondary | ICD-10-CM | POA: Diagnosis not present

## 2018-12-31 DIAGNOSIS — E1140 Type 2 diabetes mellitus with diabetic neuropathy, unspecified: Secondary | ICD-10-CM

## 2018-12-31 DIAGNOSIS — R32 Unspecified urinary incontinence: Secondary | ICD-10-CM | POA: Diagnosis not present

## 2018-12-31 DIAGNOSIS — E1142 Type 2 diabetes mellitus with diabetic polyneuropathy: Secondary | ICD-10-CM | POA: Diagnosis not present

## 2018-12-31 DIAGNOSIS — F320 Major depressive disorder, single episode, mild: Secondary | ICD-10-CM | POA: Diagnosis not present

## 2018-12-31 DIAGNOSIS — F330 Major depressive disorder, recurrent, mild: Secondary | ICD-10-CM | POA: Insufficient documentation

## 2018-12-31 LAB — POC URINALSYSI DIPSTICK (AUTOMATED)
Bilirubin, UA: NEGATIVE
Blood, UA: NEGATIVE
Glucose, UA: NEGATIVE
Ketones, UA: NEGATIVE
Leukocytes, UA: NEGATIVE
Nitrite, UA: NEGATIVE
Protein, UA: POSITIVE — AB
Spec Grav, UA: 1.025 (ref 1.010–1.025)
Urobilinogen, UA: 1 E.U./dL
pH, UA: 6 (ref 5.0–8.0)

## 2018-12-31 MED ORDER — TOLTERODINE TARTRATE ER 4 MG PO CP24: 4.0000 mg | ORAL_CAPSULE | Freq: Every day | ORAL | 11 refills | Status: DC

## 2018-12-31 NOTE — Assessment & Plan Note (Signed)
IMproved.

## 2018-12-31 NOTE — Patient Instructions (Addendum)
Can try  a daily probiotic ( Align or Culturelle)  Start detrol LA at night  For urinary incontinence. Set up yearly eye exam.  Get back on track with trying to exercise 3-5 days a week.  Take lasix/potassium for swelling as needed.

## 2018-12-31 NOTE — Assessment & Plan Note (Signed)
Well controlled. Continue current medication.  

## 2018-12-31 NOTE — Assessment & Plan Note (Signed)
Currently actively treated on tamoxifem and followed by ONc.

## 2018-12-31 NOTE — Addendum Note (Signed)
Addended by: Carter Kitten on: 12/31/2018 11:14 AM   Modules accepted: Orders

## 2018-12-31 NOTE — Assessment & Plan Note (Signed)
Diet controlled.  

## 2018-12-31 NOTE — Assessment & Plan Note (Signed)
Stable control on  prozac.

## 2018-12-31 NOTE — Assessment & Plan Note (Signed)
LDL at goal on atorvastatin.  

## 2018-12-31 NOTE — Progress Notes (Signed)
Chief Complaint  Patient presents with  . Annual Exam    Part 2    History of Present Illness: HPI  The patient presents for complete physical and review of chronic health problems. He/She also has the following acute concerns today: urinary incontinence off and on in last 6 months.  Urinary urgency.Marland Kitchen loses control before can get there. Where's pull up at night.  No dysuria, no change in frequency. No blood in urine.  Increase in gas production.  The patient saw a LPN or RN for medicare wellness visit.  Prevention and wellness was reviewed in detail. Note reviewed and important notes copied below.  Health Maintenance: Declined colonoscopy due to pandemic Declined shingrix Will schedule eye exam Abnormal Screenings: none Patient concerns: Patient wants to discuss options for urinary incontinence.   12/31/18  Diabetes:   Good control  .. improved from last check with diet. Lab Results  Component Value Date   HGBA1C 5.9 12/15/2018  Using medications without difficulties: Hypoglycemic episodes: Hyperglycemic episodes: Feet problems: neuropathy stable on gabapentin Blood Sugars averaging: not checking eye exam within last year: due.   Wt Readings from Last 3 Encounters:  12/31/18 151 lb 8 oz (68.7 kg)  12/15/18 150 lb (68 kg)  10/14/18 149 lb 8 oz (67.8 kg)     Hypertension: Good control on  Amlodipine ( now on higher dose), lasix ( prn), isosorbide, hyzaar and clonidine BP Readings from Last 3 Encounters:  12/31/18 130/68  12/24/18 132/78  12/15/18 132/80  Using medication without problems or lightheadedness: none Chest pain with exertion:none Edema: off and on.. improves with lasix prn. Short of breath: occ with exertion Average home BPs: good Other issues: Followed by Cardiology Dr. Saunders Revel given difficult to treat HTN  bradycardia resolved off B Blocker. Aortic regurg, mild.. followed with ECHO q6-12 months.   Elevated Cholesterol: LDL at goal on  atorvastatin. Lab Results  Component Value Date   CHOL 111 12/15/2018   HDL 54.80 12/15/2018   LDLCALC 43 12/15/2018   LDLDIRECT 102.5 07/21/2009   TRIG 65.0 12/15/2018   CHOLHDL 2 12/15/2018  Using medications without problems:none Muscle aches: none Diet compliance: Exercise: limited Other complaints:    GERD? Hx of PUD: stable control on nexium  Hx of breast cancer: on Tamoxifem, followed by ONC.  iron def anemia:  Hg improved.. 11.5 B12 IM at at [facility].   On prozac .Marland Kitchen not sure why.. stable mood.   Clinical Support from 12/15/2018 in [place] at [facility] Total Score  0       This visit occurred during the SARS-CoV-2 public health emergency.  Safety protocols were in place, including screening questions prior to the visit, additional usage of staff PPE, and extensive cleaning of exam room while observing appropriate contact time as indicated for disinfecting solutions.   COVID 19 screen:  No recent travel or known exposure to COVID19 The patient denies respiratory symptoms of COVID 19 at this time. The importance of social distancing was discussed today.     Review of Systems  Constitutional: Negative for chills and fever.  HENT: Negative for congestion and ear pain.   Eyes: Negative for pain and redness.  Respiratory: Negative for cough and shortness of breath.   Cardiovascular: Negative for chest pain, palpitations and leg swelling.  Gastrointestinal: Negative for abdominal pain, blood in stool, constipation, diarrhea, nausea and vomiting.  Genitourinary: Positive for urgency. Negative for dysuria, flank pain, frequency and hematuria.  Musculoskeletal: Negative for falls  and myalgias.  Skin: Negative for rash.  Neurological: Negative for dizziness.  Psychiatric/Behavioral: Negative for depression. The patient is not nervous/anxious.       Past Medical History:  Diagnosis Date  . Anemia   . Arthritis   . 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  Mastectomy/ SLN.  Marland Kitchen Chronic headache   . Complication of anesthesia    prior to 1991 used to have PONV.  none recently.  . Diverticulosis of colon   . Family history of adverse reaction to anesthesia    mother/daighter get sick  . GERD with stricture   . Hard of hearing   . Heart murmur    followed by PCP  . Hiatal hernia   . Hypertension   . Neuropathy    feet  . Osteoporosis   . Pre-diabetes   . Wears dentures    full upper    reports that she has never smoked. She has never used smokeless tobacco. She reports that she does not drink alcohol or use drugs.   Current Outpatient Medications:  .  Accu-Chek Softclix Lancets lancets, Use to check blood sugar once daily, Disp: 100 each, Rfl: 3 .  Alcohol Swabs (B-D SINGLE USE SWABS REGULAR) PADS, Use to check blood sugar once daily, Disp: 100 each, Rfl: 3 .  amLODipine (NORVASC) 5 MG tablet, Take 1 tablet by mouth daily., Disp: , Rfl:  .  aspirin EC 81 MG tablet, Take 81 mg by mouth daily. , Disp: , Rfl:  .  atorvastatin (LIPITOR) 40 MG tablet, TAKE 1 TABLET AT BEDTIME, Disp: 90 tablet, Rfl: 1 .  Blood Glucose Calibration (ACCU-CHEK AVIVA) SOLN, Use to check blood sugar once daily, Disp: 1 each, Rfl: 0 .  Blood Glucose Monitoring Suppl (ACCU-CHEK AVIVA PLUS) w/Device KIT, Use to check blood sugar once daily, Disp: 1 kit, Rfl: 0 .  Calcium Carbonate-Vitamin D (CALCIUM 600+D) 600-400 MG-UNIT per tablet, Take 1 tablet by mouth 2 (two) times daily. , Disp: , Rfl:  .  diclofenac sodium (VOLTAREN) 1 % GEL, Apply 2 g topically 4 (four) times daily., Disp: 100 g, Rfl: 0 .  esomeprazole (NEXIUM) 40 MG capsule, TAKE 1 CAPSULE 2 (TWO) TIMES DAILY BEFORE A MEAL., Disp: 180 capsule, Rfl: 1 .  FLUoxetine (PROZAC) 20 MG capsule, TAKE 1 CAPSULE EVERY DAY, Disp: 90 capsule, Rfl: 1 .  fluticasone (FLONASE) 50 MCG/ACT nasal spray, USE 2 SPRAYS INTO THE NOSE DAILY AS DIRECTED (SUBSTITUTED FOR FLONASE), Disp: 48  g, Rfl: 3 .  furosemide (LASIX) 20 MG tablet, TAKE 1 TABLET BY MOUTH EVERY DAY AS NEEDED, Disp: 30 tablet, Rfl: 5 .  gabapentin (NEURONTIN) 100 MG capsule, TAKE 1 CAPSULE IN THE MORNING, 1 CAPSULE AT LUNCH, 1 CAPSULES AT DINNER AND 2 TO 3 CAPSULES AT BEDTIME, Disp: 540 capsule, Rfl: 5 .  Glucosamine 500 MG TABS, Take 500 mg by mouth daily. , Disp: , Rfl:  .  glucose blood (ACCU-CHEK AVIVA) test strip, Use to check blood sugar once daily, Disp: 100 each, Rfl: 3 .  isosorbide mononitrate (IMDUR) 30 MG 24 hr tablet, Take 1 tablet (30 mg total) by mouth daily., Disp: 90 tablet, Rfl: 3 .  latanoprost (XALATAN) 0.005 % ophthalmic solution, Place 1 drop into both eyes at bedtime., Disp: , Rfl:  .  lidocaine (LIDODERM) 5 %, APPLY 1 PATCH ONTO THE SKIN EVERY DAY. REMOVE AND DISCARD PATCH WITHIN 12 HOURS OR AS DIRECTED BY PHYSICIAN.,  Disp: 30 patch, Rfl: 11 .  loratadine (CLARITIN) 10 MG tablet, Take 10 mg by mouth daily., Disp: , Rfl:  .  losartan-hydrochlorothiazide (HYZAAR) 100-25 MG tablet, TAKE 1 TABLET EVERY DAY, Disp: 90 tablet, Rfl: 1 .  Omega-3 Fatty Acids (FISH OIL) 1000 MG CAPS, Take 1,000 mg by mouth daily. , Disp: , Rfl:  .  polycarbophil (FIBERCON) 625 MG tablet, Take 625 mg by mouth daily., Disp: , Rfl:  .  potassium chloride (K-DUR) 10 MEQ tablet, Take 1 tablet (10 mEq total) by mouth daily. While on lasix, Disp: 90 tablet, Rfl: 3 .  tamoxifen (NOLVADEX) 20 MG tablet, Take 1 tablet (20 mg total) by mouth daily., Disp: 90 tablet, Rfl: 3 .  traMADol (ULTRAM) 50 MG tablet, Take 1 tablet (50 mg total) by mouth every 6 (six) hours as needed. for pain, Disp: 90 tablet, Rfl: 0 .  cloNIDine (CATAPRES) 0.1 MG tablet, Take 1 tablet (0.1 mg total) by mouth 2 (two) times daily as needed (Systolic blood pressure greater than 180)., Disp: 30 tablet, Rfl: 0   Observations/Objective: Blood pressure 130/68, pulse 96, temperature 98 F (36.7 C), temperature source Temporal, height 5' (1.524 m), weight 151 lb  8 oz (68.7 kg), SpO2 96 %.  Physical Exam Constitutional:      General: She is not in acute distress.    Appearance: Normal appearance. She is well-developed. She is not ill-appearing or toxic-appearing.  HENT:     Head: Normocephalic.     Right Ear: Hearing, tympanic membrane, ear canal and external ear normal.     Left Ear: Hearing, tympanic membrane, ear canal and external ear normal.     Nose: Nose normal.  Eyes:     General: Lids are normal. Lids are everted, no foreign bodies appreciated.     Conjunctiva/sclera: Conjunctivae normal.     Pupils: Pupils are equal, round, and reactive to light.  Neck:     Thyroid: No thyroid mass or thyromegaly.     Vascular: No carotid bruit.     Trachea: Trachea normal.  Cardiovascular:     Rate and Rhythm: Normal rate and regular rhythm.     Heart sounds: Normal heart sounds, S1 normal and S2 normal. No murmur. No gallop.   Pulmonary:     Effort: Pulmonary effort is normal. No respiratory distress.     Breath sounds: Normal breath sounds. No wheezing, rhonchi or rales.  Abdominal:     General: Bowel sounds are normal. There is no distension or abdominal bruit.     Palpations: Abdomen is soft. There is no fluid wave or mass.     Tenderness: There is no abdominal tenderness. There is no guarding or rebound.     Hernia: No hernia is present.  Musculoskeletal:     Cervical back: Normal range of motion and neck supple.  Lymphadenopathy:     Cervical: No cervical adenopathy.  Skin:    General: Skin is warm and dry.     Findings: No rash.  Neurological:     Mental Status: She is alert.     Cranial Nerves: No cranial nerve deficit.     Sensory: No sensory deficit.  Psychiatric:        Mood and Affect: Mood is not anxious or depressed.        Speech: Speech normal.        Behavior: Behavior normal. Behavior is cooperative.        Judgment: Judgment normal.  Diabetic foot exam: Normal inspection except small scab on left great  toe No calluses  Normal DP pulses NO sensation to light touch and monofilament Nails normal  Assessment and Plan The patient's preventative maintenance and recommended screening tests for an annual wellness exam were reviewed in full today. Brought up to date unless services declined.  Counselled on the importance of diet, exercise, and its role in overall health and mortality. The patient's FH and SH was reviewed, including their home life, tobacco status, and drug and alcohol status.   Vaccines: uptodate DEXA: Has stomach ulcer 11/2013 on PPI nexium. SE to fosamax 11/2014: osteoprosis Started [place]iva[place] of 10/2015, now off.  11/2016 She has not been taking it and not sure why. osteoporosis  Improved to osteopenia 2019,  Colon: 08/2013, Dr. Radford Pax, repeat 5 year... postponing for now. Mammo: nml 07/2018, sister breast cancer age 53. Plans yearly. PAP/DVE: partial hysterectomy, no pap smear or DVE required,. No family history of ovarian cancer.  Hep C: neg  Essential hypertension Well controlled. Continue current medication.   Hyperlipidemia associated with type 2 diabetes mellitus (HCC)  LDL at goal on atorvastatin.  Ductal carcinoma in situ (DCIS) of left breast Currently actively treated on tamoxifem and followed by ONc.  Diabetic peripheral neuropathy (HCC) Well controlled. Continue current medication.   Controlled type 2 diabetes with neuropathy (HCC) Diet controlled.  Iron deficiency anemia IMproved.  MDD (major depressive disorder), mild (HCC)  Stable control on  prozac.     Eliezer Lofts, MD

## 2019-01-01 ENCOUNTER — Ambulatory Visit (INDEPENDENT_AMBULATORY_CARE_PROVIDER_SITE_OTHER): Payer: Medicare HMO | Admitting: Physician Assistant

## 2019-01-01 ENCOUNTER — Encounter: Payer: Self-pay | Admitting: Physician Assistant

## 2019-01-01 ENCOUNTER — Encounter: Payer: Self-pay | Admitting: Family Medicine

## 2019-01-01 VITALS — BP 130/80 | HR 90 | Temp 99.1°F | Ht 60.0 in | Wt 150.2 lb

## 2019-01-01 DIAGNOSIS — I2510 Atherosclerotic heart disease of native coronary artery without angina pectoris: Secondary | ICD-10-CM

## 2019-01-01 DIAGNOSIS — I272 Pulmonary hypertension, unspecified: Secondary | ICD-10-CM | POA: Insufficient documentation

## 2019-01-01 DIAGNOSIS — I10 Essential (primary) hypertension: Secondary | ICD-10-CM | POA: Diagnosis not present

## 2019-01-01 DIAGNOSIS — I351 Nonrheumatic aortic (valve) insufficiency: Secondary | ICD-10-CM | POA: Diagnosis not present

## 2019-01-01 DIAGNOSIS — I5030 Unspecified diastolic (congestive) heart failure: Secondary | ICD-10-CM | POA: Insufficient documentation

## 2019-01-01 DIAGNOSIS — R5382 Chronic fatigue, unspecified: Secondary | ICD-10-CM

## 2019-01-01 DIAGNOSIS — D509 Iron deficiency anemia, unspecified: Secondary | ICD-10-CM

## 2019-01-01 DIAGNOSIS — I5032 Chronic diastolic (congestive) heart failure: Secondary | ICD-10-CM

## 2019-01-01 MED ORDER — AMLODIPINE BESYLATE 5 MG PO TABS: 7.5000 mg | ORAL_TABLET | Freq: Every day | ORAL | 3 refills | Status: DC

## 2019-01-01 MED ORDER — LOSARTAN POTASSIUM-HCTZ 100-25 MG PO TABS: 0.5000 | ORAL_TABLET | Freq: Every day | ORAL | 3 refills | Status: DC

## 2019-01-01 NOTE — Patient Instructions (Signed)
Medication Instructions:  1- INCREASE Amlodipine 1.5 tablets (7.5 mg total) once daily 2- DECREASE Hyzaar to 0.5 tablet (50/12.5 mg total) once daily *If you need a refill on your cardiac medications before your next appointment, please call your pharmacy*  Lab Work: None ordered  If you have labs (blood work) drawn today and your tests are completely normal, you will receive your results only by: Marland Kitchen MyChart Message (if you have MyChart) OR . A paper copy in the mail If you have any lab test that is abnormal or we need to change your treatment, we will call you to review the results.  Testing/Procedures: None ordered   Follow-Up: At [facility], you and your health needs are our priority.  As part of our continuing mission to provide you with exceptional heart care, we have created designated Provider Care Teams.  These Care Teams include your primary Cardiologist (physician) and Advanced Practice Providers (APPs -  Physician Assistants and Nurse Practitioners) who all work together to provide you with the care you need, when you need it.  Your next appointment:   3 month(s)  The format for your next appointment:   In Person  Provider:    You may see Nelva Bush, MD or Christell Faith, PA-C.

## 2019-01-11 ENCOUNTER — Other Ambulatory Visit: Payer: Self-pay

## 2019-01-12 ENCOUNTER — Inpatient Hospital Stay: Payer: Medicare HMO | Attending: Hematology and Oncology

## 2019-01-12 DIAGNOSIS — E538 Deficiency of other specified B group vitamins: Secondary | ICD-10-CM | POA: Diagnosis not present

## 2019-01-12 MED ORDER — CYANOCOBALAMIN 1000 MCG/ML IJ SOLN
1000.0000 ug | Freq: Once | INTRAMUSCULAR | Status: AC
  Administered 2019-01-12: 1000 ug via INTRAMUSCULAR

## 2019-01-12 NOTE — Patient Instructions (Signed)

## 2019-01-13 ENCOUNTER — Inpatient Hospital Stay: Payer: Medicare HMO

## 2019-02-09 ENCOUNTER — Other Ambulatory Visit: Payer: Self-pay

## 2019-02-09 NOTE — Progress Notes (Signed)
[facility]  [address], [address] [place], [place] [address] Phone: [phone]  Fax: [phone]   Telemedicine Office Visit:  02/11/2019  Referring physician: Bedsole, Amy E, MD  I connected with Ashley Ryan on 02/11/2019 at 2:52 PM by videoconferencing and verified that I was speaking with the correct person using 2 identifiers.  The patient was at home.  I discussed the limitations, risk, security and privacy concerns of performing an evaluation and management service by videoconferencing and the availability of in person appointments.  I also discussed with the patient that there may be a patient responsible charge related to this service.  The patient expressed understanding and agreed to proceed.   Chief Complaint: Ashley Ryan is a 73 y.o. female with left breast DCIS s/p mastectomyand B12 deficiency who is seen for 6 month assessment.   HPI: The patient was last seen in the medical oncology clinic on 07/29/2018. At that time, she denied any breast concerns. Exam was stable. Hematocrit 32.1, hemoglobin 10.1, platelets 166,000, WBC 6,200. Ferritin was 39 with an iron saturation of 19% and a TIBC of 298. Potassium 3.2.  B12 was 324 and folate 6.2. She continued tamoxifen, calcium and vitamin D.   Right unilateral mammogram on 08/03/2018 revealed no evidence of malignancy.   She has received monthly B12 injections (07/29/2018 - 02/10/2019).   She was last seen in cardiology by Ryan Dunn, PA-C on 01/01/2019. She was doing very well. She denied any chest, palpitations or dizziness. She remained on amlodipine 5 mg daily along with Hyzaar 50/12.5 mg daily, and  Lasix and clonidine prn.  She had not needed any clonidine.  She was taking Lasix approximately 1-2x/week secondary to mild lower extremity swelling. She had a better sleep hygiene. Dunn, PA-C titrated amlodipine to 7.5 mg daily. She continued her current mediations prn. A low sodium diet ans sleep  hygiene was recommended. Patient was directed to follow up with Dr. End in 3 months.   Labs on 02/10/2019: Hematocrit 35.8, hemoglobin 11.4, MCV 82.1, platelets 180,000, and WBC 6,200. Sodium was 133, potassium 3.0, and calcium 8.7.   During the interim, she has done well.  She notes having some trouble with her blood pressure. She is keeping track of her BP and pulse. She denies any dizziness, chest pains or shortness of breath. She examines her breast monthly. She notes her right breast near the fatty tissue is sore sometimes. Her weight is stable. She notes shortness of breath on exertion. She is taking calcium and vitamin D. She remains on oral iron. She has a good diet. She denies bleeding.   Patient is interested in getting a COVID-19 vaccine. I advised patient to call early in the morning to reserve a spot to get the vaccine.   Past Medical History:  Diagnosis Date  . Anemia   . Arthritis   . Breast cancer (HCC) 06/13/2017   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  Mastectomy/ SLN.  . Chronic headache   . Complication of anesthesia    prior to 1991 used to have PONV.  none recently.  . Diverticulosis of colon   . Family history of adverse reaction to anesthesia    mother/daighter get sick  . GERD with stricture   . Hard of hearing   . Heart murmur    followed by PCP  . Hiatal hernia   . Hypertension   . Neuropathy    feet  . Osteoporosis   .   Pre-diabetes   . Wears dentures    full upper    Past Surgical History:  Procedure Laterality Date  . ABDOMINAL HYSTERECTOMY  1978   one ovary remains  . 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 CARDIAC CATHETERIZATION     "yrs ago" all OK.  Marland Kitchen CATARACT EXTRACTION W/PHACO Left 02/21/2016   Procedure: CATARACT EXTRACTION PHACO AND INTRAOCULAR LENS PLACEMENT ([place])  Left eye;  Surgeon: Leandrew Koyanagi, MD;  Location:  [place];  Service: Ophthalmology;  Laterality: Left;  Left eye Diabetic  . CATARACT EXTRACTION W/PHACO Right 03/13/2016   Procedure: CATARACT EXTRACTION PHACO AND INTRAOCULAR LENS PLACEMENT ([place])  right  diabetic;  Surgeon: Leandrew Koyanagi, MD;  Location: [place];  Service: Ophthalmology;  Laterality: Right;  diabetic  . CHOLECYSTECTOMY  1978  . COLONOSCOPY W/ POLYPECTOMY  09/09/2013   8 mm tubular adenoma of the cecum.  Erskine Emery, MD. [facility]  . LEFT HEART CATH AND CORONARY ANGIOGRAPHY N/A 11/06/2017   Procedure: LEFT HEART CATH AND CORONARY ANGIOGRAPHY;  Surgeon: Nelva Bush, MD;  Location: [facility];  Service: Cardiovascular;  Laterality: N/A;  . MASTECTOMY Left 06/13/2017   mastectomy neg margins  . OOPHORECTOMY     one still left  . RENAL ANGIOGRAPHY  11/06/2017   Procedure: RENAL ANGIOGRAPHY;  Surgeon: Nelva Bush, MD;  Location: [facility];  Service: Cardiovascular;;  . SENTINEL NODE BIOPSY Left 06/13/2017   Procedure: SENTINEL NODE BIOPSY;  Surgeon: Robert Bellow, MD;  Location: [facility];  Service: General;  Laterality: Left;  . 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  Surgeon: Robert Bellow, MD;  Location: [facility];  Service: General;  Laterality: Left;    Family History  Problem Relation Age of Onset  . Breast cancer Other   . Stomach cancer Other   . Diabetes Other   . Heart disease Other   . Breast cancer Sister 55  . Esophageal cancer Brother   . Hypertension Mother   . Heart attack Mother 22  . Hypertension Father   . Coronary artery disease Father 49       CABG  . Stroke Father     Social History:  reports that she has never smoked. She has never used smokeless tobacco. She reports that she does not drink alcohol or use drugs. Her husband's name is Scientist, research (medical). She is a retired [employer] manager. She lives in [place]. The patient is  alone  today.  Participants in the patient's visit and their role in the encounter included the patient, and Vito Berger, CMA, today.  The intake visit was provided by Vito Berger, CMA.  Allergies:  Allergies  Allergen Reactions  . Sulfa Antibiotics Rash    Childhood reaction  . Sulfonamide Derivatives Rash    Childhood reaction.    Current Medications: Current Outpatient Medications  Medication Sig Dispense Refill  . Accu-Chek Softclix Lancets lancets Use to check blood sugar once daily 100 each 3  . Alcohol Swabs (B-D SINGLE USE SWABS REGULAR) PADS Use to check blood sugar once daily 100 each 3  . amLODipine (NORVASC) 5 MG tablet Take 1.5 tablets (7.5 mg total) by mouth daily. 135 tablet 3  . aspirin EC 81 MG tablet Take 81 mg by mouth daily.     Marland Kitchen atorvastatin (  LIPITOR) 40 MG tablet TAKE 1 TABLET AT BEDTIME 90 tablet 1  . Blood Glucose Calibration (ACCU-CHEK AVIVA) SOLN Use to check blood sugar once daily 1 each 0  . Blood Glucose Monitoring Suppl (ACCU-CHEK AVIVA PLUS) w/Device KIT Use to check blood sugar once daily 1 kit 0  . Calcium Carbonate-Vitamin D (CALCIUM 600+D) 600-400 MG-UNIT per tablet Take 1 tablet by mouth 2 (two) times daily.     . cloNIDine (CATAPRES) 0.1 MG tablet Take 1 tablet (0.1 mg total) by mouth 2 (two) times daily as needed (Systolic blood pressure greater than 180). 30 tablet 0  . diclofenac sodium (VOLTAREN) 1 % GEL Apply 2 g topically 4 (four) times daily. 100 g 0  . esomeprazole (NEXIUM) 40 MG capsule TAKE 1 CAPSULE 2 (TWO) TIMES DAILY BEFORE A MEAL. 180 capsule 1  . FLUoxetine (PROZAC) 20 MG capsule TAKE 1 CAPSULE EVERY DAY 90 capsule 1  . fluticasone (FLONASE) 50 MCG/ACT nasal spray USE 2 SPRAYS INTO THE NOSE DAILY AS DIRECTED (SUBSTITUTED FOR FLONASE) 48 g 3  . furosemide (LASIX) 20 MG tablet TAKE 1 TABLET BY MOUTH EVERY DAY AS NEEDED 30 tablet 5  . gabapentin (NEURONTIN) 100 MG capsule TAKE 1 CAPSULE IN THE MORNING, 1 CAPSULE AT LUNCH, 1  CAPSULES AT DINNER AND 2 TO 3 CAPSULES AT BEDTIME 540 capsule 5  . Glucosamine 500 MG TABS Take 500 mg by mouth daily.     . glucose blood (ACCU-CHEK AVIVA) test strip Use to check blood sugar once daily 100 each 3  . isosorbide mononitrate (IMDUR) 30 MG 24 hr tablet Take 1 tablet (30 mg total) by mouth daily. 90 tablet 3  . latanoprost (XALATAN) 0.005 % ophthalmic solution Place 1 drop into both eyes at bedtime.    . lidocaine (LIDODERM) 5 % APPLY 1 PATCH ONTO THE SKIN EVERY DAY. REMOVE AND DISCARD PATCH WITHIN 12 HOURS OR AS DIRECTED BY PHYSICIAN. 30 patch 11  . loratadine (CLARITIN) 10 MG tablet Take 10 mg by mouth daily.    . losartan-hydrochlorothiazide (HYZAAR) 100-25 MG tablet Take 0.5 tablets by mouth daily. 45 tablet 3  . Omega-3 Fatty Acids (FISH OIL) 1000 MG CAPS Take 1,000 mg by mouth daily.     . polycarbophil (FIBERCON) 625 MG tablet Take 625 mg by mouth daily.    . potassium chloride (K-DUR) 10 MEQ tablet Take 1 tablet (10 mEq total) by mouth daily. While on lasix 90 tablet 3  . tamoxifen (NOLVADEX) 20 MG tablet Take 1 tablet (20 mg total) by mouth daily. 90 tablet 3  . tolterodine (DETROL LA) 4 MG 24 hr capsule Take 1 capsule (4 mg total) by mouth daily. 30 capsule 11  . traMADol (ULTRAM) 50 MG tablet Take 1 tablet (50 mg total) by mouth every 6 (six) hours as needed. for pain 90 tablet 0  . potassium chloride SA (KLOR-CON) 20 MEQ tablet Take 2 tablets (40 mEq total) by mouth daily. 60 tablet 2   No current facility-administered medications for this visit.    Review of Systems  Constitutional: Negative.  Negative for chills, diaphoresis, fever, malaise/fatigue and weight loss (stable).       Doing well.  HENT: Negative.  Negative for congestion, hearing loss, nosebleeds, sinus pain and sore throat.   Eyes: Negative for blurred vision.  Respiratory: Positive for shortness of breath (exertional). Negative for cough, hemoptysis and sputum production.   Cardiovascular: Positive  for chest pain (right breast pain at bra line at times).   Negative for palpitations and leg swelling.  Gastrointestinal: Negative for abdominal pain, blood in stool, constipation, diarrhea, heartburn, melena, nausea and vomiting.       Diet is good.  Genitourinary: Negative.  Negative for dysuria, frequency, hematuria and urgency.  Musculoskeletal: Negative for back pain, joint pain (knees), myalgias and neck pain.  Skin: Negative.  Negative for itching and rash.  Neurological: Negative.  Negative for dizziness, tingling, sensory change, weakness and headaches.  Endo/Heme/Allergies: Negative.  Does not bruise/bleed easily.  Psychiatric/Behavioral: Negative.  Negative for depression and memory loss. The patient is not nervous/anxious and does not have insomnia.   All other systems reviewed and are negative.  Performance status (ECOG):  1  Physical Exam  Constitutional: She is oriented to person, place, and time. She appears well-developed and well-nourished. No distress.  HENT:  Head: Normocephalic and atraumatic.  Short gray hair.  Eyes: Conjunctivae and EOM are normal. No scleral icterus.  Glasses.  Blue eyes.  Neurological: She is alert and oriented to person, place, and time.  Skin: She is not diaphoretic.  Psychiatric: She has a normal mood and affect. Her behavior is normal. Judgment and thought content normal.  Nursing note reviewed.   Appointment on 02/10/2019  Component Date Value Ref Range Status  . Sodium 02/10/2019 133* 135 - 145 mmol/L Final  . Potassium 02/10/2019 3.0* 3.5 - 5.1 mmol/L Final  . Chloride 02/10/2019 102  98 - 111 mmol/L Final  . CO2 02/10/2019 24  22 - 32 mmol/L Final  . Glucose, Bld 02/10/2019 105* 70 - 99 mg/dL Final  . BUN 02/10/2019 14  8 - 23 mg/dL Final  . Creatinine, Ser 02/10/2019 0.81  0.44 - 1.00 mg/dL Final  . Calcium 02/10/2019 8.7* 8.9 - 10.3 mg/dL Final  . Total Protein 02/10/2019 6.7  6.5 - 8.1 g/dL Final  . Albumin 02/10/2019 3.8  3.5 -  5.0 g/dL Final  . AST 02/10/2019 16  15 - 41 U/L Final  . ALT 02/10/2019 14  0 - 44 U/L Final  . Alkaline Phosphatase 02/10/2019 51  38 - 126 U/L Final  . Total Bilirubin 02/10/2019 0.3  0.3 - 1.2 mg/dL Final  . GFR calc non Af Amer 02/10/2019 >60  >60 mL/min Final  . GFR calc Af Amer 02/10/2019 >60  >60 mL/min Final  . Anion gap 02/10/2019 7  5 - 15 Final   Performed at [facility], [address] [place], [place] [address]  . WBC 02/10/2019 6.2  4.0 - 10.5 K/uL Final  . RBC 02/10/2019 4.36  3.87 - 5.11 MIL/uL Final  . Hemoglobin 02/10/2019 11.4* 12.0 - 15.0 g/dL Final  . HCT 02/10/2019 35.8* 36.0 - 46.0 % Final  . MCV 02/10/2019 82.1  80.0 - 100.0 fL Final  . MCH 02/10/2019 26.1  26.0 - 34.0 pg Final  . MCHC 02/10/2019 31.8  30.0 - 36.0 g/dL Final  . RDW 02/10/2019 15.9* 11.5 - 15.5 % Final  . Platelets 02/10/2019 180  150 - 400 K/uL Final  . nRBC 02/10/2019 0.0  0.0 - 0.2 % Final  . Neutrophils Relative % 02/10/2019 61  % Final  . Neutro Abs 02/10/2019 3.8  1.7 - 7.7 K/uL Final  . Lymphocytes Relative 02/10/2019 28  % Final  . Lymphs Abs 02/10/2019 1.8  0.7 - 4.0 K/uL Final  . Monocytes Relative 02/10/2019 9  % Final  . Monocytes Absolute 02/10/2019 0.6  0.1 - 1.0 K/uL Final  . Eosinophils Relative  02/10/2019 1  % Final  . Eosinophils Absolute 02/10/2019 0.1  0.0 - 0.5 K/uL Final  . Basophils Relative 02/10/2019 1  % Final  . Basophils Absolute 02/10/2019 0.1  0.0 - 0.1 K/uL Final  . Immature Granulocytes 02/10/2019 0  % Final  . Abs Immature Granulocytes 02/10/2019 0.02  0.00 - 0.07 K/uL Final   Performed at [facility], [address], [place], [place] [address]    Assessment:  Ashley Ryan is a 74 y.o. female withleft breast DCISs/p simple mastectomy on 06/13/2017. Pathology revealed at least 8 mm of grade III DCIS. Margins were negative. One sentinel lymph node was negative. DCIS was ER was + (>90%) and PR + (75%). Pathologic stagewas  Tis N0.  She began tamoxifen on 07/03/2017.  Diagnostic left mammogramon 05/14/2017 revealed grouped pleomorphic calcifications within the lower outer quadrant of the LEFT breast, spanning 7 mm.  Right screening mammogram on 08/03/2018 revealed no evidence of malignancy.   Bone density studyon 11/22/2014 revealed osteopeniawith a T-score of -2.4 in the AP spine L1-L4. Bone densityon 06/02/2017 revealed osteopenia with a T-score of -2.1 in the AP spine L1-L4 and -1.9 in the right femoral neck.  She a history of iron deficiency anemia. She received a blood transfusion 6 years ago. She took oral ironx 2 years. Labs on 06/02/2017 revealed a ferritin 43 with normal iron studies. Folate was 9.6.   Ferritin has been followed: 5.3 on 08/10/2013, 48.2 on 05/14/2016, 43 on 06/02/2017, 35 on 10/31/2017, and 39 on 07/29/2018.  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 Colonoscopyon 09/09/2013 revealed an 8 mm sessile polyp in the cecum (tubular adenoma).  She has B12 deficiency.  She receives B12 monthly (last 02/10/2019)  Symptomatically, she notes some right breast tenderness at the bra line.  She denies any bleeding.  Plan: 1.   Review labs from 02/10/2019. 2.   Left breast DCIS Clinically, she is doing well.    She notes intermittent right breast discomfort at the bra line.       Right screening mammogram on 08/03/2018 was negative.         Continue tamoxifen. 3.   Osteopenia Continue calcium and vitamin D.  Schedule bone density on 06/04/2019. 4.   B12 deficiency Patient receives B12 monthly (last 02/10/2019).  RTC monthly x 6 for B12.  Check folate annually. 5. Iron deficiency Hematocrit 35.8.  Hemoglobin 11.4.  MCV 82.1.               Ferritin 39 with an iron saturation of 19% and a TIBC of 298 on 07/29/2018.             She remains on oral  iron.  Consider IV iron if ferritin < 30.  Review EGD and colonoscopy from 2015.   Patient previously saw a gastroenterologist in [place].   Discuss GI referral. 6.   RTC in 6 months for MD assessment, labs (CBC with diff, ferritin, folate), and B12.  I discussed the assessment and treatment plan with the patient.  The patient was provided an opportunity to ask questions and all were answered.  The patient agreed with the plan and demonstrated an understanding of the instructions.  The patient was advised to call back if the symptoms worsen or if the condition fails to improve as anticipated.   Lequita Asal, MD, PhD    02/11/2019, 2:52 PM  I, Selena Batten, am  acting as scribe for Melissa C. Corcoran, MD, PhD.  I, Melissa C. Corcoran, MD, have reviewed the above documentation for accuracy and completeness, and I agree with the above.     

## 2019-02-10 ENCOUNTER — Inpatient Hospital Stay: Payer: Medicare HMO | Attending: Hematology and Oncology

## 2019-02-10 ENCOUNTER — Encounter: Payer: Self-pay | Admitting: Hematology and Oncology

## 2019-02-10 ENCOUNTER — Inpatient Hospital Stay: Payer: Medicare HMO

## 2019-02-10 VITALS — BP 127/71 | HR 85 | Temp 97.9°F | Resp 18

## 2019-02-10 DIAGNOSIS — Z79899 Other long term (current) drug therapy: Secondary | ICD-10-CM | POA: Insufficient documentation

## 2019-02-10 DIAGNOSIS — Z800 Family history of malignant neoplasm of digestive organs: Secondary | ICD-10-CM | POA: Diagnosis not present

## 2019-02-10 DIAGNOSIS — Z8249 Family history of ischemic heart disease and other diseases of the circulatory system: Secondary | ICD-10-CM | POA: Diagnosis not present

## 2019-02-10 DIAGNOSIS — Z170 Estrogen receptor positive status [ER+]: Secondary | ICD-10-CM | POA: Insufficient documentation

## 2019-02-10 DIAGNOSIS — I10 Essential (primary) hypertension: Secondary | ICD-10-CM | POA: Insufficient documentation

## 2019-02-10 DIAGNOSIS — Z9012 Acquired absence of left breast and nipple: Secondary | ICD-10-CM | POA: Insufficient documentation

## 2019-02-10 DIAGNOSIS — E538 Deficiency of other specified B group vitamins: Secondary | ICD-10-CM | POA: Insufficient documentation

## 2019-02-10 DIAGNOSIS — Z791 Long term (current) use of non-steroidal anti-inflammatories (NSAID): Secondary | ICD-10-CM | POA: Insufficient documentation

## 2019-02-10 DIAGNOSIS — Z7982 Long term (current) use of aspirin: Secondary | ICD-10-CM | POA: Insufficient documentation

## 2019-02-10 DIAGNOSIS — D0512 Intraductal carcinoma in situ of left breast: Secondary | ICD-10-CM | POA: Diagnosis not present

## 2019-02-10 DIAGNOSIS — Z803 Family history of malignant neoplasm of breast: Secondary | ICD-10-CM | POA: Insufficient documentation

## 2019-02-10 DIAGNOSIS — M8580 Other specified disorders of bone density and structure, unspecified site: Secondary | ICD-10-CM | POA: Diagnosis not present

## 2019-02-10 DIAGNOSIS — Z833 Family history of diabetes mellitus: Secondary | ICD-10-CM | POA: Diagnosis not present

## 2019-02-10 DIAGNOSIS — Z90721 Acquired absence of ovaries, unilateral: Secondary | ICD-10-CM | POA: Insufficient documentation

## 2019-02-10 DIAGNOSIS — Z90710 Acquired absence of both cervix and uterus: Secondary | ICD-10-CM | POA: Insufficient documentation

## 2019-02-10 LAB — COMPREHENSIVE METABOLIC PANEL
ALT: 14 U/L (ref 0–44)
AST: 16 U/L (ref 15–41)
Albumin: 3.8 g/dL (ref 3.5–5.0)
Alkaline Phosphatase: 51 U/L (ref 38–126)
Anion gap: 7 (ref 5–15)
BUN: 14 mg/dL (ref 8–23)
CO2: 24 mmol/L (ref 22–32)
Calcium: 8.7 mg/dL — ABNORMAL LOW (ref 8.9–10.3)
Chloride: 102 mmol/L (ref 98–111)
Creatinine, Ser: 0.81 mg/dL (ref 0.44–1.00)
GFR calc Af Amer: >60 mL/min (ref >60)
GFR calc non Af Amer: >60 mL/min (ref >60)
Glucose, Bld: 105 mg/dL — ABNORMAL HIGH (ref 70–99)
Potassium: 3 mmol/L — ABNORMAL LOW (ref 3.5–5.1)
Sodium: 133 mmol/L — ABNORMAL LOW (ref 135–145)
Total Bilirubin: 0.3 mg/dL (ref 0.3–1.2)
Total Protein: 6.7 g/dL (ref 6.5–8.1)

## 2019-02-10 LAB — CBC WITH DIFFERENTIAL/PLATELET
Abs Immature Granulocytes: 0.02 10*3/uL (ref 0.00–0.07)
Basophils Absolute: 0.1 10*3/uL (ref 0.0–0.1)
Basophils Relative: 1 %
Eosinophils Absolute: 0.1 10*3/uL (ref 0.0–0.5)
Eosinophils Relative: 1 %
HCT: 35.8 % — ABNORMAL LOW (ref 36.0–46.0)
Hemoglobin: 11.4 g/dL — ABNORMAL LOW (ref 12.0–15.0)
Immature Granulocytes: 0 %
Lymphocytes Relative: 28 %
Lymphs Abs: 1.8 10*3/uL (ref 0.7–4.0)
MCH: 26.1 pg (ref 26.0–34.0)
MCHC: 31.8 g/dL (ref 30.0–36.0)
MCV: 82.1 fL (ref 80.0–100.0)
Monocytes Absolute: 0.6 10*3/uL (ref 0.1–1.0)
Monocytes Relative: 9 %
Neutro Abs: 3.8 10*3/uL (ref 1.7–7.7)
Neutrophils Relative %: 61 %
Platelets: 180 10*3/uL (ref 150–400)
RBC: 4.36 MIL/uL (ref 3.87–5.11)
RDW: 15.9 % — ABNORMAL HIGH (ref 11.5–15.5)
WBC: 6.2 10*3/uL (ref 4.0–10.5)
nRBC: 0 % (ref 0.0–0.2)

## 2019-02-10 MED ORDER — CYANOCOBALAMIN 1000 MCG/ML IJ SOLN
1000.0000 ug | Freq: Once | INTRAMUSCULAR | Status: AC
  Administered 2019-02-10: 1000 ug via INTRAMUSCULAR

## 2019-02-10 NOTE — Progress Notes (Signed)
No new changes noted today. The patient Name and DOB has been verified by phone today. 

## 2019-02-10 NOTE — Patient Instructions (Signed)

## 2019-02-11 ENCOUNTER — Telehealth: Payer: Self-pay | Admitting: Cardiovascular Disease

## 2019-02-11 ENCOUNTER — Other Ambulatory Visit: Payer: Medicare HMO

## 2019-02-11 ENCOUNTER — Inpatient Hospital Stay (HOSPITAL_BASED_OUTPATIENT_CLINIC_OR_DEPARTMENT_OTHER): Payer: Medicare HMO | Admitting: Hematology and Oncology

## 2019-02-11 ENCOUNTER — Ambulatory Visit: Payer: Medicare HMO

## 2019-02-11 ENCOUNTER — Telehealth: Payer: Self-pay | Admitting: Internal Medicine

## 2019-02-11 ENCOUNTER — Encounter: Payer: Self-pay | Admitting: Hematology and Oncology

## 2019-02-11 ENCOUNTER — Telehealth: Payer: Self-pay

## 2019-02-11 DIAGNOSIS — D0512 Intraductal carcinoma in situ of left breast: Secondary | ICD-10-CM | POA: Diagnosis not present

## 2019-02-11 DIAGNOSIS — E538 Deficiency of other specified B group vitamins: Secondary | ICD-10-CM | POA: Diagnosis not present

## 2019-02-11 DIAGNOSIS — M8588 Other specified disorders of bone density and structure, other site: Secondary | ICD-10-CM | POA: Diagnosis not present

## 2019-02-11 DIAGNOSIS — E876 Hypokalemia: Secondary | ICD-10-CM

## 2019-02-11 DIAGNOSIS — D509 Iron deficiency anemia, unspecified: Secondary | ICD-10-CM

## 2019-02-11 MED ORDER — POTASSIUM CHLORIDE CRYS ER 20 MEQ PO TBCR: 40.0000 meq | EXTENDED_RELEASE_TABLET | Freq: Every day | ORAL | 2 refills | Status: DC

## 2019-02-11 NOTE — Telephone Encounter (Signed)
Patient had visit with Dr Mike Gip today.  Lab work drawn as well. Routing to Dr End for review and any further recommendations.

## 2019-02-11 NOTE — Telephone Encounter (Signed)
Contacted Christell Faith, PA/Dr. Rockey Situ with Cardiology. Spoke with Anderson Malta and informed her that Dr. Mike Gip would like for MD to be made aware of Hypokalemia and the fact that she is taking Lasix PRN. Anderson Malta states she will make them aware.

## 2019-02-11 NOTE — Telephone Encounter (Deleted)
[facility] calling to relay low k+ result of 3.0   Please call to discuss

## 2019-02-11 NOTE — Telephone Encounter (Signed)
Called and spoke with patient.  She verbalized understanding to take potassium chloride 40 mEq once a day and get repeat BMET in 2 weeks at the [facility]. She wrote these instructions down and read them back to me correctly.

## 2019-02-11 NOTE — Telephone Encounter (Signed)
Please have Ms. Cliburn start taking potassium chloride 40 mEq daily and repeat a BMP in 2 weeks.  Thanks.  Nelva Bush, MD [facility]

## 2019-02-11 NOTE — Telephone Encounter (Signed)
Signed        [facility] calling to relay low k+ result of 3.0   Please call to discuss

## 2019-02-11 NOTE — Telephone Encounter (Signed)
ERROR

## 2019-02-15 ENCOUNTER — Encounter: Payer: Self-pay | Admitting: *Deleted

## 2019-02-16 NOTE — Progress Notes (Signed)
Ashley Partch T. Ashley Ebanks, MD Primary Care and [place] at [facility] [place], [address] Phone: [phone]  FAX: [phone]  Ashley Ryan - 74 y.o. female  MRN [id number]  Date of Birth: [birth year]  Visit Date: 02/17/2019  PCP: [name], MD  Referred by: [name], MD  Chief Complaint  Patient presents with  . Knee Pain    right    Procedure only:  This visit occurred during the SARS-CoV-2 public health emergency.  Safety protocols were in place, including screening questions prior to the visit, additional usage of staff PPE, and extensive cleaning of exam room while observing appropriate contact time as indicated for disinfecting solutions.     Radiology: CLINICAL DATA:  Right knee pain.  EXAM: RIGHT KNEE - COMPLETE 4+ VIEW  COMPARISON:  None.  FINDINGS: No fracture or dislocation. Severe degenerative change involving the lateral compartment of the right knee with near complete joint space loss, bone-on-bone articulation, subchondral sclerosis and osteophytosis.  Mild degenerative change involving the medial compartment and patellofemoral joints with joint space loss, subchondral sclerosis osteophytosis.  Small knee joint effusion. No evidence of chondrocalcinosis. Regional soft tissues appear normal.  IMPRESSION: Severe degenerative change involving the lateral compartment of the right knee.   Electronically Signed   By: Sandi Mariscal M.D.   On: 04/30/2018 15:46  Assessment and Plan:     ICD-10-CM   1. Primary osteoarthritis of right knee  M17.11    OA flare up.  Discussed options.  Trying to avoid knee replacement, particularly in light of Covid-19.  Aspiration/Injection Procedure Note ABRIEL HATTERY [birth year] Date of procedure: 02/17/2019  Procedure: Large Joint Aspiration / Injection of Knee, R Indications: Pain  Procedure Details Patient verbally consented to  procedure. Risks (including potential rare risk of infection), benefits, and alternatives explained. Sterilely prepped with Chloraprep. Ethyl cholride used for anesthesia. 8 cc Lidocaine 1% mixed with 2 mL Depo-Medrol 40 mg injected using the anteromedial approach without difficulty. No complications with procedure and tolerated well. Patient had decreased pain post-injection. Medication: 2 mL of Depo-Medrol 40 mg, equaling Depo-Medrol 80 mg total   Follow-up: No follow-ups on file.  No orders of the defined types were placed in this encounter.  Modified Medications   No medications on file   No orders of the defined types were placed in this encounter.   Signed,  [name]. [name], MD   Outpatient Encounter Medications as of 02/17/2019  Medication Sig  . Accu-Chek Softclix Lancets lancets Use to check blood sugar once daily  . Alcohol Swabs (B-D SINGLE USE SWABS REGULAR) PADS Use to check blood sugar once daily  . amLODipine (NORVASC) 5 MG tablet Take 1.5 tablets (7.5 mg total) by mouth daily.  Marland Kitchen aspirin EC 81 MG tablet Take 81 mg by mouth daily.   Marland Kitchen atorvastatin (LIPITOR) 40 MG tablet TAKE 1 TABLET AT BEDTIME  . Blood Glucose Calibration (ACCU-CHEK AVIVA) SOLN Use to check blood sugar once daily  . Blood Glucose Monitoring Suppl (ACCU-CHEK AVIVA PLUS) w/Device KIT Use to check blood sugar once daily  . Calcium Carbonate-Vitamin D (CALCIUM 600+D) 600-400 MG-UNIT per tablet Take 1 tablet by mouth 2 (two) times daily.   . cloNIDine (CATAPRES) 0.1 MG tablet Take 1 tablet (0.1 mg total) by mouth 2 (two) times daily as needed (Systolic blood pressure greater than 180).  . diclofenac sodium (VOLTAREN) 1 % GEL Apply 2 g topically 4 (  four) times daily.  Marland Kitchen esomeprazole (NEXIUM) 40 MG capsule TAKE 1 CAPSULE 2 (TWO) TIMES DAILY BEFORE A MEAL.  Marland Kitchen FLUoxetine (PROZAC) 20 MG capsule TAKE 1 CAPSULE EVERY DAY  . fluticasone (FLONASE) 50 MCG/ACT nasal spray USE 2 SPRAYS INTO THE NOSE DAILY AS DIRECTED  (SUBSTITUTED FOR FLONASE)  . furosemide (LASIX) 20 MG tablet TAKE 1 TABLET BY MOUTH EVERY DAY AS NEEDED  . gabapentin (NEURONTIN) 100 MG capsule TAKE 1 CAPSULE IN THE MORNING, 1 CAPSULE AT LUNCH, 1 CAPSULES AT DINNER AND 2 TO 3 CAPSULES AT BEDTIME  . Glucosamine 500 MG TABS Take 500 mg by mouth daily.   Marland Kitchen glucose blood (ACCU-CHEK AVIVA) test strip Use to check blood sugar once daily  . isosorbide mononitrate (IMDUR) 30 MG 24 hr tablet Take 1 tablet (30 mg total) by mouth daily.  Marland Kitchen latanoprost (XALATAN) 0.005 % ophthalmic solution Place 1 drop into both eyes at bedtime.  . lidocaine (LIDODERM) 5 % APPLY 1 PATCH ONTO THE SKIN EVERY DAY. REMOVE AND DISCARD PATCH WITHIN 12 HOURS OR AS DIRECTED BY PHYSICIAN.  Marland Kitchen loratadine (CLARITIN) 10 MG tablet Take 10 mg by mouth daily.  Marland Kitchen losartan-hydrochlorothiazide (HYZAAR) 100-25 MG tablet Take 0.5 tablets by mouth daily.  . Omega-3 Fatty Acids (FISH OIL) 1000 MG CAPS Take 1,000 mg by mouth daily.   . polycarbophil (FIBERCON) 625 MG tablet Take 625 mg by mouth daily.  . potassium chloride (K-DUR) 10 MEQ tablet Take 1 tablet (10 mEq total) by mouth daily. While on lasix  . potassium chloride SA (KLOR-CON) 20 MEQ tablet Take 2 tablets (40 mEq total) by mouth daily.  . tamoxifen (NOLVADEX) 20 MG tablet Take 1 tablet (20 mg total) by mouth daily.  Marland Kitchen tolterodine (DETROL LA) 4 MG 24 hr capsule Take 1 capsule (4 mg total) by mouth daily.  . traMADol (ULTRAM) 50 MG tablet Take 1 tablet (50 mg total) by mouth every 6 (six) hours as needed. for pain   No facility-administered encounter medications on file as of 02/17/2019.

## 2019-02-17 ENCOUNTER — Ambulatory Visit (INDEPENDENT_AMBULATORY_CARE_PROVIDER_SITE_OTHER): Payer: Medicare HMO | Admitting: Family Medicine

## 2019-02-17 ENCOUNTER — Other Ambulatory Visit: Payer: Self-pay

## 2019-02-17 ENCOUNTER — Encounter: Payer: Self-pay | Admitting: Family Medicine

## 2019-02-17 ENCOUNTER — Other Ambulatory Visit: Payer: Self-pay | Admitting: Family Medicine

## 2019-02-17 VITALS — BP 126/78 | HR 90 | Temp 97.9°F | Ht 60.0 in | Wt 150.0 lb

## 2019-02-17 DIAGNOSIS — M1711 Unilateral primary osteoarthritis, right knee: Secondary | ICD-10-CM

## 2019-02-17 MED ORDER — METHYLPREDNISOLONE ACETATE 40 MG/ML IJ SUSP
80.0000 mg | Freq: Once | INTRAMUSCULAR | Status: AC
  Administered 2019-02-17: 12:00:00 80 mg via INTRA_ARTICULAR

## 2019-02-17 NOTE — Addendum Note (Signed)
Addended by: Carter Kitten on: 02/17/2019 11:58 AM   Modules accepted: Orders

## 2019-02-18 ENCOUNTER — Other Ambulatory Visit: Payer: Self-pay | Admitting: *Deleted

## 2019-02-18 MED ORDER — TRAMADOL HCL 50 MG PO TABS: 50.0000 mg | ORAL_TABLET | Freq: Four times a day (QID) | ORAL | 0 refills | Status: DC | PRN

## 2019-02-18 NOTE — Telephone Encounter (Signed)
Last office visit 02/17/2019 with Dr. Lorelei Pont for knee pain.  Last refilled 10/27/2018 for #90 with no refills.  Next Appt: 07/01/2019 for 6 month follow up with Dr. Diona Browner.

## 2019-02-26 ENCOUNTER — Ambulatory Visit: Payer: Medicare HMO | Admitting: Gastroenterology

## 2019-03-04 ENCOUNTER — Other Ambulatory Visit: Payer: Self-pay | Admitting: Hematology and Oncology

## 2019-03-04 ENCOUNTER — Telehealth: Payer: Self-pay

## 2019-03-04 ENCOUNTER — Other Ambulatory Visit
Admission: RE | Admit: 2019-03-04 | Discharge: 2019-03-04 | Disposition: A | Discharge status: Home / Self Care | Payer: Medicare HMO | Attending: Hematology and Oncology | Admitting: Hematology and Oncology

## 2019-03-04 DIAGNOSIS — E538 Deficiency of other specified B group vitamins: Secondary | ICD-10-CM

## 2019-03-04 DIAGNOSIS — M8588 Other specified disorders of bone density and structure, other site: Secondary | ICD-10-CM | POA: Diagnosis not present

## 2019-03-04 DIAGNOSIS — D509 Iron deficiency anemia, unspecified: Secondary | ICD-10-CM | POA: Insufficient documentation

## 2019-03-04 DIAGNOSIS — R790 Abnormal level of blood mineral: Secondary | ICD-10-CM

## 2019-03-04 LAB — CBC WITH DIFFERENTIAL/PLATELET
Abs Immature Granulocytes: 0.02 10*3/uL (ref 0.00–0.07)
Basophils Absolute: 0.1 10*3/uL (ref 0.0–0.1)
Basophils Relative: 1 %
Eosinophils Absolute: 0.1 10*3/uL (ref 0.0–0.5)
Eosinophils Relative: 1 %
HCT: 35.2 % — ABNORMAL LOW (ref 36.0–46.0)
Hemoglobin: 11.1 g/dL — ABNORMAL LOW (ref 12.0–15.0)
Immature Granulocytes: 0 %
Lymphocytes Relative: 19 %
Lymphs Abs: 1.3 10*3/uL (ref 0.7–4.0)
MCH: 25.9 pg — ABNORMAL LOW (ref 26.0–34.0)
MCHC: 31.5 g/dL (ref 30.0–36.0)
MCV: 82.2 fL (ref 80.0–100.0)
Monocytes Absolute: 0.6 10*3/uL (ref 0.1–1.0)
Monocytes Relative: 9 %
Neutro Abs: 4.7 10*3/uL (ref 1.7–7.7)
Neutrophils Relative %: 70 %
Platelets: 183 10*3/uL (ref 150–400)
RBC: 4.28 MIL/uL (ref 3.87–5.11)
RDW: 16.5 % — ABNORMAL HIGH (ref 11.5–15.5)
WBC: 6.7 10*3/uL (ref 4.0–10.5)
nRBC: 0 % (ref 0.0–0.2)

## 2019-03-04 LAB — BASIC METABOLIC PANEL
Anion gap: 10 (ref 5–15)
BUN: 13 mg/dL (ref 8–23)
CO2: 25 mmol/L (ref 22–32)
Calcium: 8.8 mg/dL — ABNORMAL LOW (ref 8.9–10.3)
Chloride: 104 mmol/L (ref 98–111)
Creatinine, Ser: 0.74 mg/dL (ref 0.44–1.00)
GFR calc Af Amer: >60 mL/min (ref >60)
GFR calc non Af Amer: >60 mL/min (ref >60)
Glucose, Bld: 98 mg/dL (ref 70–99)
Potassium: 3.6 mmol/L (ref 3.5–5.1)
Sodium: 139 mmol/L (ref 135–145)

## 2019-03-04 LAB — FERRITIN: Ferritin: 24 ng/mL (ref 11–307)

## 2019-03-04 LAB — FOLATE: Folate: 5.9 ng/mL — ABNORMAL LOW (ref >5.9)

## 2019-03-04 NOTE — Telephone Encounter (Signed)
Informed patient of Folate and Ferritin level. Patient encouraged to start taking Folic Acid 1 MG Daily, per Dr. Mike Gip. Also questioned patient if she is still taking her oral Iron and patient states she is not. Informed patient to start taking Ferrous Sulfate 325 MG again. Patient aware that we will recheck levels in 1 month. Patient verbalizes understanding and denies any further questions or concerns.

## 2019-03-04 NOTE — Telephone Encounter (Signed)
-----   Message from Lequita Asal, MD sent at 03/04/2019 12:49 PM EST ----- Regarding: Please call patient  Folate is low.  Begin folic acid 1 mg a day.  Check folate level in 1 month.  Is she taking her oral iron?  Ferritin is slightly low.  May need to consider IV iron in the future.  M ----- Message ----- From: Buel Ream, Lab In [place] Sent: 03/04/2019   9:07 AM EST To: Lequita Asal, MD

## 2019-03-09 ENCOUNTER — Ambulatory Visit: Payer: Medicare HMO | Admitting: Gastroenterology

## 2019-03-15 ENCOUNTER — Inpatient Hospital Stay: Payer: Medicare HMO | Attending: Hematology and Oncology

## 2019-03-15 ENCOUNTER — Other Ambulatory Visit: Payer: Self-pay

## 2019-03-15 VITALS — BP 137/80 | HR 75 | Temp 97.8°F | Resp 16

## 2019-03-15 DIAGNOSIS — E538 Deficiency of other specified B group vitamins: Secondary | ICD-10-CM | POA: Diagnosis not present

## 2019-03-15 MED ORDER — CYANOCOBALAMIN 1000 MCG/ML IJ SOLN
1000.0000 ug | Freq: Once | INTRAMUSCULAR | Status: AC
  Administered 2019-03-15: 1000 ug via INTRAMUSCULAR

## 2019-03-15 NOTE — Patient Instructions (Signed)

## 2019-03-31 NOTE — Progress Notes (Signed)
Follow-up Outpatient Visit Date: 04/01/2019  Primary Care Provider: [name], MD [place] [address]  Chief Complaint: Follow-up HFpEF and hypertension  HPI:  Ashley Ryan is a 73 y.o. female with history of HFpEF, pulmonary hypertension, hypertension, hiatal hernia, GERD, prediabetes, anemia, and arthritis, who presents for follow-up of hypertension and HFpEF. She was last seen in our office by Christell Faith, PA, in 12/2018. She was doing well at that time, noting that she had not needed any as needed clonidine. She was using furosemide twice a week for management of her leg swelling. Amlodipine was increased to 7.5 mg daily and losartan.HCTZ to 50-12.5 mg daily.  Today, Ashley Ryan reports that she is feeling relatively well.  She has noted sporadic pain under the left axilla that is not exertional and typically only lasts a few seconds at a time.  She wonders if it could be related to prior breast surgery.  She has not had any other chest pain.  Chronic exertional dyspnea is stable and she is able to perform her activities around the house without limitation.  She has not had any palpitations or lightheadedness.  She recently tripped and fell, bruising and scratching her calves.  Since this happened, she has noted slight increase in baseline swelling of both calves.  She remains compliant with her medications and has not needed to use as needed clonidine or furosemide for some time.  Home blood pressures are typically in the 440 systolic range.  --------------------------------------------------------------------------------------------------  Past Medical History:  Diagnosis Date  . Anemia   . Arthritis   . 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  Mastectomy/ SLN.  Marland Kitchen Chronic headache   . Complication of anesthesia    prior to 1991 used to have PONV.  none recently.  . Diverticulosis of colon   . Family history of adverse reaction  to anesthesia    mother/daighter get sick  . GERD with stricture   . Hard of hearing   . Heart murmur    followed by PCP  . Hiatal hernia   . Hypertension   . Neuropathy    feet  . Osteoporosis   . Pre-diabetes   . Wears dentures    full upper   Past Surgical History:  Procedure Laterality Date  . ABDOMINAL HYSTERECTOMY  1978   one ovary remains  . 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 CARDIAC CATHETERIZATION     "yrs ago" all OK.  Marland Kitchen CATARACT EXTRACTION W/PHACO Left 02/21/2016   Procedure: CATARACT EXTRACTION PHACO AND INTRAOCULAR LENS PLACEMENT ([place])  Left eye;  Surgeon: Leandrew Koyanagi, MD;  Location: [facility];  Service: Ophthalmology;  Laterality: Left;  Left eye Diabetic  . CATARACT EXTRACTION W/PHACO Right 03/13/2016   Procedure: CATARACT EXTRACTION PHACO AND INTRAOCULAR LENS PLACEMENT ([place])  right  diabetic;  Surgeon: Leandrew Koyanagi, MD;  Location: [facility];  Service: Ophthalmology;  Laterality: Right;  diabetic  . CHOLECYSTECTOMY  1978  . COLONOSCOPY W/ POLYPECTOMY  09/09/2013   8 mm tubular adenoma of the cecum.  Erskine Emery, MD. [facility]  . LEFT HEART CATH AND CORONARY ANGIOGRAPHY N/A 11/06/2017   Procedure: LEFT HEART CATH AND CORONARY ANGIOGRAPHY;  Surgeon: Nelva Bush, MD;  Location: [facility];  Service: Cardiovascular;  Laterality: N/A;  . MASTECTOMY Left 06/13/2017  mastectomy neg margins  . OOPHORECTOMY     one still left  . RENAL ANGIOGRAPHY  11/06/2017   Procedure: RENAL ANGIOGRAPHY;  Surgeon: Nelva Bush, MD;  Location: [facility];  Service: Cardiovascular;;  . SENTINEL NODE BIOPSY Left 06/13/2017   Procedure: SENTINEL NODE BIOPSY;  Surgeon: Robert Bellow, MD;  Location: [facility];  Service: General;  Laterality: Left;  . SIMPLE MASTECTOMY WITH AXILLARY SENTINEL NODE BIOPSY Left  06/13/2017   8 mm high grade DCIS, negative SLN. ER/PR+.  Surgeon: Robert Bellow, MD;  Location: [facility];  Service: General;  Laterality: Left;    Current Meds  Medication Sig  . Accu-Chek Softclix Lancets lancets Use to check blood sugar once daily  . Alcohol Swabs (B-D SINGLE USE SWABS REGULAR) PADS Use to check blood sugar once daily  . amLODipine (NORVASC) 5 MG tablet Take 1.5 tablets (7.5 mg total) by mouth daily.  Marland Kitchen aspirin EC 81 MG tablet Take 81 mg by mouth daily.   Marland Kitchen atorvastatin (LIPITOR) 40 MG tablet TAKE 1 TABLET AT BEDTIME  . Blood Glucose Calibration (ACCU-CHEK AVIVA) SOLN Use to check blood sugar once daily  . Blood Glucose Monitoring Suppl (ACCU-CHEK AVIVA PLUS) w/Device KIT Use to check blood sugar once daily  . Calcium Carbonate-Vitamin D (CALCIUM 600+D) 600-400 MG-UNIT per tablet Take 1 tablet by mouth 2 (two) times daily.   . cloNIDine (CATAPRES) 0.1 MG tablet Take 1 tablet (0.1 mg total) by mouth 2 (two) times daily as needed (Systolic blood pressure greater than 180).  . diclofenac Sodium (VOLTAREN) 1 % GEL APPLY 2 GRAMS  TOPICALLY 4 (FOUR) TIMES DAILY.  Marland Kitchen esomeprazole (NEXIUM) 40 MG capsule TAKE 1 CAPSULE 2 (TWO) TIMES DAILY BEFORE A MEAL.  Marland Kitchen FLUoxetine (PROZAC) 20 MG capsule TAKE 1 CAPSULE EVERY DAY  . fluticasone (FLONASE) 50 MCG/ACT nasal spray USE 2 SPRAYS INTO THE NOSE DAILY AS DIRECTED (SUBSTITUTED FOR FLONASE)  . furosemide (LASIX) 20 MG tablet TAKE 1 TABLET BY MOUTH EVERY DAY AS NEEDED  . gabapentin (NEURONTIN) 100 MG capsule TAKE 1 CAPSULE IN THE MORNING, 1 CAPSULE AT LUNCH, 1 CAPSULES AT DINNER AND 2 TO 3 CAPSULES AT BEDTIME  . Glucosamine 500 MG TABS Take 500 mg by mouth daily.   Marland Kitchen glucose blood (ACCU-CHEK AVIVA) test strip Use to check blood sugar once daily  . isosorbide mononitrate (IMDUR) 30 MG 24 hr tablet Take 1 tablet (30 mg total) by mouth daily.  Marland Kitchen latanoprost (XALATAN) 0.005 % ophthalmic solution Place 1 drop into both eyes at bedtime.  .  lidocaine (LIDODERM) 5 % APPLY 1 PATCH ONTO THE SKIN EVERY DAY. REMOVE AND DISCARD PATCH WITHIN 12 HOURS OR AS DIRECTED BY PHYSICIAN.  Marland Kitchen loratadine (CLARITIN) 10 MG tablet Take 10 mg by mouth daily.  Marland Kitchen losartan-hydrochlorothiazide (HYZAAR) 100-25 MG tablet Take 0.5 tablets by mouth daily.  . Omega-3 Fatty Acids (FISH OIL) 1000 MG CAPS Take 1,000 mg by mouth daily.   . polycarbophil (FIBERCON) 625 MG tablet Take 625 mg by mouth daily.  . potassium chloride (K-DUR) 10 MEQ tablet Take 1 tablet (10 mEq total) by mouth daily. While on lasix  . potassium chloride SA (KLOR-CON) 20 MEQ tablet Take 2 tablets (40 mEq total) by mouth daily.  . tamoxifen (NOLVADEX) 20 MG tablet Take 1 tablet (20 mg total) by mouth daily.  Marland Kitchen tolterodine (DETROL LA) 4 MG 24 hr capsule Take 1 capsule (4 mg total) by mouth daily.  . traMADol (ULTRAM) 50  MG tablet Take 1 tablet (50 mg total) by mouth every 6 (six) hours as needed. for pain    Allergies: Sulfa antibiotics and Sulfonamide derivatives  Social History   Tobacco Use  . Smoking status: Never Smoker  . Smokeless tobacco: Never Used  Substance Use Topics  . Alcohol use: No  . Drug use: No    Family History  Problem Relation Age of Onset  . Breast cancer Other   . Stomach cancer Other   . Diabetes Other   . Heart disease Other   . Breast cancer Sister 58  . Esophageal cancer Brother   . Hypertension Mother   . Heart attack Mother 45  . Hypertension Father   . Coronary artery disease Father 22       CABG  . Stroke Father     Review of Systems: A 12-system review of systems was performed and was negative except as noted in the HPI.  --------------------------------------------------------------------------------------------------  Physical Exam: BP (!) 150/80 (BP Location: Left Arm, Patient Position: Sitting, Cuff Size: Normal)   Pulse 76   Temp 98.1 F (36.7 C)   Ht 5' (1.524 m)   Wt 152 lb (68.9 kg)   SpO2 95%   BMI 29.69 kg/m   Repeat  BP: 134/70  General: NAD. HEENT: No conjunctival pallor or scleral icterus. Facemask in place. Neck: No JVD or HJR Lungs: Normal work of breathing. Clear to auscultation bilaterally without wheezes or crackles. Heart: Regular rate and rhythm with 2/6 systolic murmur, loudest at LLSB. Non-displaced PMI. Abd: Bowel sounds present. Soft, NT/ND. Ext: Trace pretibial edema bilaterally. Skin: Bruising and abrasions on both calves.  EKG: Normal sinus rhythm without significant abnormality.  Lab Results  Component Value Date   WBC 6.7 03/04/2019   HGB 11.1 (L) 03/04/2019   HCT 35.2 (L) 03/04/2019   MCV 82.2 03/04/2019   PLT 183 03/04/2019    Lab Results  Component Value Date   NA 139 03/04/2019   K 3.6 03/04/2019   CL 104 03/04/2019   CO2 25 03/04/2019   BUN 13 03/04/2019   CREATININE 0.74 03/04/2019   GLUCOSE 98 03/04/2019   ALT 14 02/10/2019    Lab Results  Component Value Date   CHOL 111 12/15/2018   HDL 54.80 12/15/2018   LDLCALC 43 12/15/2018   LDLDIRECT 102.5 07/21/2009   TRIG 65.0 12/15/2018   CHOLHDL 2 12/15/2018    --------------------------------------------------------------------------------------------------  ASSESSMENT AND PLAN: Chronic HFpEF: Other than chronic lower extremity edema, Ms. Frederic appears euvolemic with stable NYHA class II symptoms.  Blood pressure initially was mildly elevated but better on recheck.  We will defer medication changes at this time.  Ms. Mcerlean can continue to use as needed furosemide for weight gain or edema.  Stable angina: Ms. Gulotta had clean catheterization in 2019 with suspicion for microvascular dysfunction that has improved with low-dose isosorbide mononitrate.  Her left axilla pain is not typical for angina and is most likely musculoskeletal.  I have recommended discontinuation of low-dose aspirin, given recent fall, easy bruising, and chronic mild anemia in the setting of no significant atherosclerotic CAD or renal artery  stenosis.  We will continue isosorbide mononitrate and amlodipine for antianginal therapy.  Hypertension: Blood pressure initially mildly elevated today but improved on recheck.  Home blood pressure readings are reasonable.  We will continue current regimen of amlodipine and losartan/HCTZ.  Ms. Slee still has as needed clonidine on hand but hopefully will not need this on a regular  basis.  I encouraged her to increase her activity, as tolerated and to continue to limit sodium intake.  Hyperlipidemia: LDL well controlled.  Continue atorvastatin.  Aortic regurgitation: No symptoms reported with stable volume status.  Continue clinical follow-up.  Follow-up: Return to clinic in 6 months.  Nelva Bush, MD 04/01/2019 8:20 AM

## 2019-04-01 ENCOUNTER — Ambulatory Visit (INDEPENDENT_AMBULATORY_CARE_PROVIDER_SITE_OTHER): Payer: Medicare HMO | Admitting: Internal Medicine

## 2019-04-01 ENCOUNTER — Encounter: Payer: Self-pay | Admitting: Internal Medicine

## 2019-04-01 ENCOUNTER — Other Ambulatory Visit: Payer: Self-pay

## 2019-04-01 VITALS — BP 150/80 | HR 76 | Temp 98.1°F | Ht 60.0 in | Wt 152.0 lb

## 2019-04-01 DIAGNOSIS — I351 Nonrheumatic aortic (valve) insufficiency: Secondary | ICD-10-CM | POA: Diagnosis not present

## 2019-04-01 DIAGNOSIS — E785 Hyperlipidemia, unspecified: Secondary | ICD-10-CM | POA: Diagnosis not present

## 2019-04-01 DIAGNOSIS — I5032 Chronic diastolic (congestive) heart failure: Secondary | ICD-10-CM | POA: Diagnosis not present

## 2019-04-01 DIAGNOSIS — I208 Other forms of angina pectoris: Secondary | ICD-10-CM | POA: Diagnosis not present

## 2019-04-01 DIAGNOSIS — I2089 Other forms of angina pectoris: Secondary | ICD-10-CM | POA: Insufficient documentation

## 2019-04-01 DIAGNOSIS — I10 Essential (primary) hypertension: Secondary | ICD-10-CM

## 2019-04-01 NOTE — Patient Instructions (Signed)
Medication Instructions:  Your physician has recommended you make the following change in your medication:  1. STOP: Aspirin 81 mg.  *If you need a refill on your cardiac medications before your next appointment, please call your pharmacy*   Lab Work: None If you have labs (blood work) drawn today and your tests are completely normal, you will receive your results only by: Marland Kitchen MyChart Message (if you have MyChart) OR . A paper copy in the mail If you have any lab test that is abnormal or we need to change your treatment, we will call you to review the results.   Testing/Procedures: None   Follow-Up: At [facility], you and your health needs are our priority.  As part of our continuing mission to provide you with exceptional heart care, we have created designated Provider Care Teams.  These Care Teams include your primary Cardiologist (physician) and Advanced Practice Providers (APPs -  Physician Assistants and Nurse Practitioners) who all work together to provide you with the care you need, when you need it.  We recommend signing up for the patient portal called "MyChart".  Sign up information is provided on this After Visit Summary.  MyChart is used to connect with patients for Virtual Visits (Telemedicine).  Patients are able to view lab/test results, encounter notes, upcoming appointments, etc.  Non-urgent messages can be sent to your provider as well.   To learn more about what you can do with MyChart, go to [email or web address].    Your next appointment:   6 month(s)  The format for your next appointment:   In Person  Provider:   Nelva Bush, MD

## 2019-04-12 ENCOUNTER — Other Ambulatory Visit: Payer: Medicare HMO

## 2019-04-12 ENCOUNTER — Ambulatory Visit: Payer: Medicare HMO

## 2019-04-13 ENCOUNTER — Inpatient Hospital Stay: Payer: Medicare HMO

## 2019-04-13 ENCOUNTER — Inpatient Hospital Stay: Payer: Medicare HMO | Attending: Hematology and Oncology

## 2019-04-13 ENCOUNTER — Other Ambulatory Visit: Payer: Self-pay

## 2019-04-13 VITALS — BP 109/68 | HR 68 | Temp 97.0°F | Resp 18

## 2019-04-13 DIAGNOSIS — Z79899 Other long term (current) drug therapy: Secondary | ICD-10-CM | POA: Insufficient documentation

## 2019-04-13 DIAGNOSIS — E611 Iron deficiency: Secondary | ICD-10-CM | POA: Diagnosis not present

## 2019-04-13 DIAGNOSIS — D0512 Intraductal carcinoma in situ of left breast: Secondary | ICD-10-CM | POA: Diagnosis not present

## 2019-04-13 DIAGNOSIS — M8580 Other specified disorders of bone density and structure, unspecified site: Secondary | ICD-10-CM | POA: Insufficient documentation

## 2019-04-13 DIAGNOSIS — Z170 Estrogen receptor positive status [ER+]: Secondary | ICD-10-CM | POA: Insufficient documentation

## 2019-04-13 DIAGNOSIS — E538 Deficiency of other specified B group vitamins: Secondary | ICD-10-CM | POA: Insufficient documentation

## 2019-04-13 DIAGNOSIS — Z79810 Long term (current) use of selective estrogen receptor modulators (SERMs): Secondary | ICD-10-CM | POA: Diagnosis not present

## 2019-04-13 DIAGNOSIS — R790 Abnormal level of blood mineral: Secondary | ICD-10-CM

## 2019-04-13 LAB — FOLATE: Folate: 7.8 ng/mL (ref >5.9)

## 2019-04-13 LAB — FERRITIN: Ferritin: 27 ng/mL (ref 11–307)

## 2019-04-13 MED ORDER — CYANOCOBALAMIN 1000 MCG/ML IJ SOLN
1000.0000 ug | Freq: Once | INTRAMUSCULAR | Status: AC
  Administered 2019-04-13: 1000 ug via INTRAMUSCULAR
  Filled 2019-04-13: qty 1

## 2019-04-13 NOTE — Patient Instructions (Signed)

## 2019-04-14 ENCOUNTER — Telehealth: Payer: Self-pay

## 2019-04-14 NOTE — Telephone Encounter (Signed)
Patient made aware to start taking ferrous sulfate 325 mg po q day with OJ or vitamin C.  If tolerating, can increase to BID.  Medication is over the counter. If unable to tolerate, can pursue IV Venofer. Patient verbalizes understanding.

## 2019-04-14 NOTE — Telephone Encounter (Signed)
  Patient should be taking ferrous sulfate 325 mg po q day with OJ or vitamin C.  If tolerating, can increase to BID.  Medication is over the counter.  If unable to tolerate, can pursue IV Venofer.   Lequita Asal, MD

## 2019-04-14 NOTE — Telephone Encounter (Signed)
Called patient to see if she is taking oral iron per Dr Mike Gip message. Patient states that she was but she stopped about a week ago because she ran out. She doesn't know how much she was taking.

## 2019-04-14 NOTE — Telephone Encounter (Signed)
-----   Message from [place], [place] sent at 04/14/2019  2:01 PM EDT ----- Regarding: FW: Please call patient  ----- Message ----- From: Lequita Asal, MD Sent: 04/14/2019   1:09 PM EDT To: Arlan Organ, RN, # Subject: Please call patient                             Folic acid level looks good.    Ferritin slightly improved.  Is she taking oral iron?  M  ----- Message ----- From: Buel Ream, Lab In [place] Sent: 04/13/2019   9:41 PM EDT To: Lequita Asal, MD

## 2019-04-29 ENCOUNTER — Other Ambulatory Visit: Payer: Self-pay

## 2019-04-29 ENCOUNTER — Ambulatory Visit: Payer: Medicare HMO | Admitting: Gastroenterology

## 2019-04-29 VITALS — BP 139/73 | HR 86 | Temp 98.8°F | Ht 60.0 in | Wt 151.6 lb

## 2019-04-29 DIAGNOSIS — E611 Iron deficiency: Secondary | ICD-10-CM

## 2019-04-29 NOTE — Progress Notes (Signed)
Gastroenterology Consultation  Referring Provider:     Lequita Asal, MD Primary Care Physician:  [name], MD Primary Gastroenterologist:  Dr. Allen Norris     Reason for Consultation:     Iron deficiency anemia        HPI:   Ashley Ryan is a 74 y.o. y/o female referred for consultation & management of iron deficiency anemia by Dr. Diona Browner, Mervyn Gay, MD.  This patient comes in with a finding of iron deficiency anemia.  The patient had followed up with [facility] from 2003 up until 2016 with her last colonoscopy and EGD in 2015.  At that time the patient had a polyp removed from the colon.  The polyp was shown to be a adenoma in the discharge instructions stated that if the patient had an adenoma she should have a repeat colonoscopy in 5 years.  The patient's most recent iron studies have shown:  Component     Latest Ref Rng & Units 06/02/2017 10/31/2017 07/29/2018  Iron     28 - 170 ug/dL 34 29 55  TIBC     250 - 450 ug/dL 275 268 298  Saturation Ratios     10.4 - 31.8 % '12 11 19  '$ UIBC     ug/dL 241 239 243   With her ferritin being checked more recently showing:  Component     Latest Ref Rng & Units 06/02/2017 10/31/2017 07/29/2018 03/04/2019  Ferritin     11 - 307 ng/mL 43 35 39 24   Component     Latest Ref Rng & Units 04/13/2019  Ferritin     11 - 307 ng/mL 27   The patient has had a low hemoglobin and hematocrit for some time with a normal MCV.  The trending of the labs have shown:  Component     Latest Ref Rng & Units 11/06/2017 05/26/2018 06/03/2018 07/29/2018             Hemoglobin     12.0 - 15.0 g/dL 9.3 (L) 10.7 (L) 10.6 (L) 10.1 (L)  HCT     36.0 - 46.0 % 30.2 (L) 32.7 (L) 33.7 (L) 32.1 (L)  MCV     80.0 - 100.0 fL 86.0 83.0 85.3 86.1   Component     Latest Ref Rng & Units 12/15/2018 02/10/2019 03/04/2019            Hemoglobin     12.0 - 15.0 g/dL 11.5 (L) 11.4 (L) 11.1 (L)  HCT     36.0 - 46.0 % 36.3 35.8 (L) 35.2 (L)  MCV     80.0 - 100.0  fL 84.0 82.1 82.2   The patient is presently on iron supplementation.  The patient denies any black stools or bloody stools.  She also denies any unexplained abdominal pain fevers chills nausea or vomiting.  She states that she was treated for breast cancer and is in remission.  Past Medical History:  Diagnosis Date  . Anemia   . Arthritis   . 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  Mastectomy/ SLN.  Marland Kitchen Chronic headache   . Complication of anesthesia    prior to 1991 used to have PONV.  none recently.  . Diverticulosis of colon   . Family history of adverse reaction to anesthesia    mother/daighter get sick  . GERD with stricture   . Hard of hearing   . Heart  murmur    followed by PCP  . Hiatal hernia   . Hypertension   . Neuropathy    feet  . Osteoporosis   . Pre-diabetes   . Wears dentures    full upper    Past Surgical History:  Procedure Laterality Date  . ABDOMINAL HYSTERECTOMY  1978   one ovary remains  . 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 CARDIAC CATHETERIZATION     "yrs ago" all OK.  Marland Kitchen CATARACT EXTRACTION W/PHACO Left 02/21/2016   Procedure: CATARACT EXTRACTION PHACO AND INTRAOCULAR LENS PLACEMENT ([place])  Left eye;  Surgeon: Leandrew Koyanagi, MD;  Location: [place];  Service: Ophthalmology;  Laterality: Left;  Left eye Diabetic  . CATARACT EXTRACTION W/PHACO Right 03/13/2016   Procedure: CATARACT EXTRACTION PHACO AND INTRAOCULAR LENS PLACEMENT ([place])  right  diabetic;  Surgeon: Leandrew Koyanagi, MD;  Location: [place];  Service: Ophthalmology;  Laterality: Right;  diabetic  . CHOLECYSTECTOMY  1978  . COLONOSCOPY W/ POLYPECTOMY  09/09/2013   8 mm tubular adenoma of the cecum.  Erskine Emery, MD. [place] [facility]  . LEFT HEART CATH AND CORONARY ANGIOGRAPHY N/A 11/06/2017   Procedure: LEFT HEART  CATH AND CORONARY ANGIOGRAPHY;  Surgeon: Nelva Bush, MD;  Location: [place] [facility];  Service: Cardiovascular;  Laterality: N/A;  . MASTECTOMY Left 06/13/2017   mastectomy neg margins  . OOPHORECTOMY     one still left  . RENAL ANGIOGRAPHY  11/06/2017   Procedure: RENAL ANGIOGRAPHY;  Surgeon: Nelva Bush, MD;  Location: [place] [facility];  Service: Cardiovascular;;  . SENTINEL NODE BIOPSY Left 06/13/2017   Procedure: SENTINEL NODE BIOPSY;  Surgeon: Robert Bellow, MD;  Location: [facility];  Service: General;  Laterality: Left;  . 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  Surgeon: Robert Bellow, MD;  Location: [facility];  Service: General;  Laterality: Left;    Prior to Admission medications   Medication Sig Start Date End Date Taking? Authorizing Provider  Accu-Chek Softclix Lancets lancets Use to check blood sugar once daily 10/27/18   Bedsole, Amy E, MD  Alcohol Swabs (B-D SINGLE USE SWABS REGULAR) PADS Use to check blood sugar once daily 10/27/18   Bedsole, Amy E, MD  amLODipine (NORVASC) 5 MG tablet Take 1.5 tablets (7.5 mg total) by mouth daily. 01/01/19   Dunn, Areta Haber, PA-C  atorvastatin (LIPITOR) 40 MG tablet TAKE 1 TABLET AT BEDTIME 10/26/18   Bedsole, Amy E, MD  Blood Glucose Calibration (ACCU-CHEK AVIVA) SOLN Use to check blood sugar once daily 10/27/18   Bedsole, Amy E, MD  Blood Glucose Monitoring Suppl (ACCU-CHEK AVIVA PLUS) w/Device KIT Use to check blood sugar once daily 02/20/18   Bedsole, Amy E, MD  Calcium Carbonate-Vitamin D (CALCIUM 600+D) 600-400 MG-UNIT per tablet Take 1 tablet by mouth 2 (two) times daily.     [provider]  cloNIDine (CATAPRES) 0.1 MG tablet Take 1 tablet (0.1 mg total) by mouth 2 (two) times daily as needed (Systolic blood pressure greater than 180). 11/03/17 01/01/28  Harvest Dark, MD  diclofenac sodium (VOLTAREN) 1 % GEL Apply 2 g topically 4  (four) times daily. Patient not taking: Reported on 04/01/2019 10/27/18   [name], MD  diclofenac Sodium (VOLTAREN) 1 % GEL APPLY 2 GRAMS  TOPICALLY 4 (FOUR) TIMES  DAILY. 02/17/19   Bedsole, Amy E, MD  esomeprazole (NEXIUM) 40 MG capsule TAKE 1 CAPSULE 2 (TWO) TIMES DAILY BEFORE A MEAL. 02/17/19   Bedsole, Amy E, MD  FLUoxetine (PROZAC) 20 MG capsule TAKE 1 CAPSULE EVERY DAY 02/17/19   Bedsole, Amy E, MD  fluticasone (FLONASE) 50 MCG/ACT nasal spray USE 2 SPRAYS INTO THE NOSE DAILY AS DIRECTED (SUBSTITUTED FOR FLONASE) 10/26/18   Bedsole, Amy E, MD  furosemide (LASIX) 20 MG tablet TAKE 1 TABLET BY MOUTH EVERY DAY AS NEEDED 01/15/18   Bedsole, Amy E, MD  gabapentin (NEURONTIN) 100 MG capsule TAKE 1 CAPSULE IN THE MORNING, 1 CAPSULE AT LUNCH, 1 CAPSULES AT DINNER AND 2 TO 3 CAPSULES AT BEDTIME 11/20/18   Bedsole, Amy E, MD  Glucosamine 500 MG TABS Take 500 mg by mouth daily.     [provider]  glucose blood (ACCU-CHEK AVIVA) test strip Use to check blood sugar once daily 10/27/18   Bedsole, Amy E, MD  isosorbide mononitrate (IMDUR) 30 MG 24 hr tablet Take 1 tablet (30 mg total) by mouth daily. 10/27/18   Bedsole, Amy E, MD  latanoprost (XALATAN) 0.005 % ophthalmic solution Place 1 drop into both eyes at bedtime.    [provider]  lidocaine (LIDODERM) 5 % APPLY 1 PATCH ONTO THE SKIN EVERY DAY. REMOVE AND DISCARD PATCH WITHIN 12 HOURS OR AS DIRECTED BY PHYSICIAN. 08/06/18   Bedsole, Amy E, MD  loratadine (CLARITIN) 10 MG tablet Take 10 mg by mouth daily.    [provider]  losartan-hydrochlorothiazide (HYZAAR) 100-25 MG tablet Take 0.5 tablets by mouth daily. 01/01/19   Rise Mu, PA-C  Omega-3 Fatty Acids (FISH OIL) 1000 MG CAPS Take 1,000 mg by mouth daily.     [provider]  polycarbophil (FIBERCON) 625 MG tablet Take 625 mg by mouth daily.    [provider]  potassium chloride (K-DUR) 10 MEQ tablet Take 1 tablet (10 mEq total) by mouth daily.  While on lasix 09/22/18   Dunn, Areta Haber, PA-C  potassium chloride SA (KLOR-CON) 20 MEQ tablet Take 2 tablets (40 mEq total) by mouth daily. 02/11/19 05/12/19  End, Harrell Gave, MD  tamoxifen (NOLVADEX) 20 MG tablet Take 1 tablet (20 mg total) by mouth daily. 08/29/17   Karen Kitchens, NP  tolterodine (DETROL LA) 4 MG 24 hr capsule Take 1 capsule (4 mg total) by mouth daily. 12/31/18   Bedsole, Amy E, MD  traMADol (ULTRAM) 50 MG tablet Take 1 tablet (50 mg total) by mouth every 6 (six) hours as needed. for pain 02/18/19   [name], MD    Family History  Problem Relation Age of Onset  . Breast cancer Other   . Stomach cancer Other   . Diabetes Other   . Heart disease Other   . Breast cancer Sister 63  . Esophageal cancer Brother   . Hypertension Mother   . Heart attack Mother 76  . Hypertension Father   . Coronary artery disease Father 68       CABG  . Stroke Father      Social History   Tobacco Use  . Smoking status: Never Smoker  . Smokeless tobacco: Never Used  Substance Use Topics  . Alcohol use: No  . Drug use: No    Allergies as of 04/29/2019 - Review Complete 04/01/2019  Allergen Reaction Noted  . Sulfa antibiotics Rash 09/09/2013  . Sulfonamide derivatives Rash     Review of  Systems:    All systems reviewed and negative except where noted in HPI.   Physical Exam:  There were no vitals taken for this visit. No LMP recorded. Patient has had a hysterectomy. General:   Alert,  Well-developed, well-nourished, pleasant and cooperative in NAD Head:  Normocephalic and atraumatic. Eyes:  Sclera clear, no icterus.   Conjunctiva pink. Ears:  Normal auditory acuity. Neck:  Supple; no masses or thyromegaly. Lungs:  Respirations even and unlabored.  Clear throughout to auscultation.   No wheezes, crackles, or rhonchi. No acute distress. Heart:  Regular rate and rhythm; systolic murmur, clicks, rubs, or gallops. Abdomen:  Normal bowel sounds.  No bruits.  Soft, non-tender and  non-distended without masses, hepatosplenomegaly or hernias noted.  No guarding or rebound tenderness.  Negative Carnett sign.   Rectal:  Deferred.  Pulses:  Normal pulses noted. Extremities:  No clubbing or edema.  No cyanosis. Neurologic:  Alert and oriented x3;  grossly normal neurologically. Skin:  Intact without significant lesions or rashes.  No jaundice. Lymph Nodes:  No significant cervical adenopathy. Psych:  Alert and cooperative. Normal mood and affect.  Imaging Studies: No results found.  Assessment and Plan:   SKY PRIMO is a 74 y.o. y/o female who comes in today with iron deficiency anemia.  The patient had a history of a colon polyp that was removed back in 2015 and was recommended to have a repeat colonoscopy in 5 years.  The patient has now had been evaluated by hematology and recommended to see GI for a work-up for her iron deficiency anemia.  The patient will be set up for an EGD and colonoscopy.  The patient has been told that if nothing is found on these exams that she may need a capsule endoscopy.  The patient has been explained the plan and agrees with it.    Lucilla Lame, MD. [facility]    Note: This dictation was prepared with Dragon dictation along with smaller phrase technology. Any transcriptional errors that result from this process are unintentional.

## 2019-04-30 ENCOUNTER — Other Ambulatory Visit: Payer: Self-pay

## 2019-04-30 DIAGNOSIS — E611 Iron deficiency: Secondary | ICD-10-CM

## 2019-05-05 ENCOUNTER — Other Ambulatory Visit: Payer: Self-pay | Admitting: Family Medicine

## 2019-05-05 NOTE — Telephone Encounter (Signed)
Last office visit 02/17/2019 with Dr. Lorelei Pont for knee pain.  Last refilled 02/17/2019 for 100 g with 2 refills.  Next Appt: 07/01/2019 for DM.

## 2019-05-06 ENCOUNTER — Other Ambulatory Visit: Payer: Self-pay | Admitting: *Deleted

## 2019-05-06 MED ORDER — TRAMADOL HCL 50 MG PO TABS: 50.0000 mg | ORAL_TABLET | Freq: Four times a day (QID) | ORAL | 0 refills | Status: DC | PRN

## 2019-05-06 NOTE — Telephone Encounter (Signed)
Last office visit with Dr. Lorelei Pont 02/17/2019 for knee pain.  Last refilled 02/18/2019 for #90 with no refills.  Next Appt: 07/01/2019 for 6 month follow up.

## 2019-05-07 ENCOUNTER — Telehealth: Payer: Self-pay | Admitting: Internal Medicine

## 2019-05-07 DIAGNOSIS — I10 Essential (primary) hypertension: Secondary | ICD-10-CM

## 2019-05-07 DIAGNOSIS — I5032 Chronic diastolic (congestive) heart failure: Secondary | ICD-10-CM

## 2019-05-07 NOTE — Telephone Encounter (Signed)
Refill request for Potassium 20MEQ - Take two tablets daily.  Called patient to verify instructions due to her having potassium listed on her medication list twice.   Patient stated that she is currently taking Potassium 20MEQ - one tablet daily.  Patient also stated that the last time she had lab work done her potassium was low but unsure how she needed to take it so she has just been taking one tablet daily.   Please let me know how you would like me to fill medication

## 2019-05-11 ENCOUNTER — Other Ambulatory Visit: Payer: Self-pay

## 2019-05-11 ENCOUNTER — Inpatient Hospital Stay: Payer: Medicare HMO | Attending: Hematology and Oncology

## 2019-05-11 VITALS — BP 122/75 | HR 77 | Temp 98.2°F | Resp 18

## 2019-05-11 DIAGNOSIS — E538 Deficiency of other specified B group vitamins: Secondary | ICD-10-CM | POA: Diagnosis not present

## 2019-05-11 DIAGNOSIS — M8588 Other specified disorders of bone density and structure, other site: Secondary | ICD-10-CM | POA: Insufficient documentation

## 2019-05-11 DIAGNOSIS — D509 Iron deficiency anemia, unspecified: Secondary | ICD-10-CM | POA: Insufficient documentation

## 2019-05-11 DIAGNOSIS — D0512 Intraductal carcinoma in situ of left breast: Secondary | ICD-10-CM | POA: Diagnosis not present

## 2019-05-11 MED ORDER — CYANOCOBALAMIN 1000 MCG/ML IJ SOLN
1000.0000 ug | Freq: Once | INTRAMUSCULAR | Status: AC
  Administered 2019-05-11: 1000 ug via INTRAMUSCULAR
  Filled 2019-05-11: qty 1

## 2019-05-11 NOTE — Patient Instructions (Signed)

## 2019-05-13 ENCOUNTER — Ambulatory Visit: Payer: Medicare HMO

## 2019-05-20 ENCOUNTER — Telehealth: Payer: Self-pay | Admitting: *Deleted

## 2019-05-20 ENCOUNTER — Encounter: Payer: Self-pay | Admitting: Internal Medicine

## 2019-05-20 ENCOUNTER — Other Ambulatory Visit
Admission: RE | Admit: 2019-05-20 | Discharge: 2019-05-20 | Disposition: A | Discharge status: Home / Self Care | Payer: Medicare HMO | Source: Ambulatory Visit | Attending: Internal Medicine | Admitting: Internal Medicine

## 2019-05-20 DIAGNOSIS — I10 Essential (primary) hypertension: Secondary | ICD-10-CM

## 2019-05-20 DIAGNOSIS — I5032 Chronic diastolic (congestive) heart failure: Secondary | ICD-10-CM | POA: Insufficient documentation

## 2019-05-20 DIAGNOSIS — E876 Hypokalemia: Secondary | ICD-10-CM

## 2019-05-20 LAB — BASIC METABOLIC PANEL
Anion gap: 10 (ref 5–15)
BUN: 8 mg/dL (ref 8–23)
CO2: 27 mmol/L (ref 22–32)
Calcium: 8.7 mg/dL — ABNORMAL LOW (ref 8.9–10.3)
Chloride: 101 mmol/L (ref 98–111)
Creatinine, Ser: 0.66 mg/dL (ref 0.44–1.00)
GFR calc Af Amer: >60 mL/min (ref >60)
GFR calc non Af Amer: >60 mL/min (ref >60)
Glucose, Bld: 114 mg/dL — ABNORMAL HIGH (ref 70–99)
Potassium: 2.7 mmol/L — CL (ref 3.5–5.1)
Sodium: 138 mmol/L (ref 135–145)

## 2019-05-20 MED ORDER — POTASSIUM CHLORIDE CRYS ER 20 MEQ PO TBCR: EXTENDED_RELEASE_TABLET | ORAL | 3 refills | Status: DC

## 2019-05-20 NOTE — Telephone Encounter (Signed)
Dr End,  Please take a look at this. Patient was on 40 mEq for K+ 3.0 on 1/20. Repeat on 2/11 - K+ 3.6.  Spoke with patient again today. Since talking to [place] patient has gone back to taking Potassium 20 mEq BID. There was a time before that she was only taking 20 mEq once a day. Patient reports taking furosemide 20 mg prn about 1 at most 2 times a week.  Please advise what dose of Potassium to be on now.

## 2019-05-20 NOTE — Telephone Encounter (Signed)
Let's have Ms. Bonanno get a BMP at her convenience.  That will allow [place] to appropriately dose her potassium.  For now, she can take 20 mEq daily when she takes her prn furosemide.  Nelva Bush, MD [facility]

## 2019-05-20 NOTE — Telephone Encounter (Signed)
Critical Lab received on patient.  Potassium 2.7. Routing to Dr End for review.

## 2019-05-20 NOTE — Telephone Encounter (Signed)
Called patient and she is agreeable to the BMET. States she is going to town soon and will stop by the [facility] for lab work this afternoon. Med list updated for now.

## 2019-05-20 NOTE — Telephone Encounter (Signed)
Secure chat sent to Dr End: Let's have her take potassium chloride 40 mEq tonight and tomorrow morning, followed by 20 mEq daily thereafter. She should get a repeat BMP in ~1 week.    Patient called and she verbalized understanding to take potassium 40 mEq tonight and in the morning and 20 mEq daily thereafter. States she has enough potassium at home at this time. She will plan to return to the [facility] next Thursday for repeat BMET. Med list updated and BMET order entered.

## 2019-05-25 DIAGNOSIS — H401132 Primary open-angle glaucoma, bilateral, moderate stage: Secondary | ICD-10-CM | POA: Diagnosis not present

## 2019-05-25 LAB — HM DIABETES EYE EXAM

## 2019-05-27 ENCOUNTER — Other Ambulatory Visit
Admission: RE | Admit: 2019-05-27 | Discharge: 2019-05-27 | Disposition: A | Discharge status: Home / Self Care | Payer: Medicare HMO | Source: Ambulatory Visit | Attending: Internal Medicine | Admitting: Internal Medicine

## 2019-05-27 ENCOUNTER — Other Ambulatory Visit: Payer: Self-pay | Admitting: *Deleted

## 2019-05-27 DIAGNOSIS — E876 Hypokalemia: Secondary | ICD-10-CM | POA: Diagnosis not present

## 2019-05-27 DIAGNOSIS — E1140 Type 2 diabetes mellitus with diabetic neuropathy, unspecified: Secondary | ICD-10-CM

## 2019-05-27 LAB — BASIC METABOLIC PANEL
Anion gap: 8 (ref 5–15)
BUN: 11 mg/dL (ref 8–23)
CO2: 24 mmol/L (ref 22–32)
Calcium: 9 mg/dL (ref 8.9–10.3)
Chloride: 104 mmol/L (ref 98–111)
Creatinine, Ser: 0.61 mg/dL (ref 0.44–1.00)
GFR calc Af Amer: >60 mL/min (ref >60)
GFR calc non Af Amer: >60 mL/min (ref >60)
Glucose, Bld: 99 mg/dL (ref 70–99)
Potassium: 3.3 mmol/L — ABNORMAL LOW (ref 3.5–5.1)
Sodium: 136 mmol/L (ref 135–145)

## 2019-05-27 MED ORDER — TRUE METRIX LEVEL 1 LOW VI SOLN: 3 refills | Status: DC

## 2019-05-27 MED ORDER — TRUEPLUS LANCETS 28G MISC
3 refills | Status: DC
Start: 2019-05-27 — End: 2020-06-12

## 2019-05-27 MED ORDER — TRUE METRIX BLOOD GLUCOSE TEST VI STRP: ORAL_STRIP | 3 refills | Status: DC

## 2019-05-27 MED ORDER — TRUE METRIX AIR GLUCOSE METER W/DEVICE KIT: PACK | 0 refills | Status: DC

## 2019-05-27 MED ORDER — BD SWAB SINGLE USE REGULAR PADS: MEDICATED_PAD | 3 refills | Status: DC

## 2019-05-28 ENCOUNTER — Encounter: Payer: Self-pay | Admitting: Family Medicine

## 2019-05-28 ENCOUNTER — Telehealth: Payer: Self-pay | Admitting: *Deleted

## 2019-05-28 MED ORDER — POTASSIUM CHLORIDE CRYS ER 20 MEQ PO TBCR
40.0000 meq | EXTENDED_RELEASE_TABLET | Freq: Every day | ORAL | 4 refills | Status: DC
Start: 2019-05-28 — End: 2019-07-01

## 2019-05-28 NOTE — Telephone Encounter (Signed)
-----   Message from Nelva Bush, MD sent at 05/28/2019  8:17 AM EDT ----- Please let Ashley Ryan know that her potassium has improved but is still a little bit low.  I suggest that we increase potassium chloride to 40 mEq daily.

## 2019-05-28 NOTE — Telephone Encounter (Signed)
Results called to pt. Pt verbalized understanding of results and to increase Potassium chloride to 40 mEq daily. Rx sent to pharmacy.

## 2019-06-02 ENCOUNTER — Other Ambulatory Visit
Admission: RE | Admit: 2019-06-02 | Discharge: 2019-06-02 | Disposition: A | Discharge status: Home / Self Care | Payer: Medicare HMO | Source: Ambulatory Visit | Attending: Gastroenterology | Admitting: Gastroenterology

## 2019-06-02 DIAGNOSIS — Z01812 Encounter for preprocedural laboratory examination: Secondary | ICD-10-CM | POA: Diagnosis not present

## 2019-06-02 DIAGNOSIS — Z20822 Contact with and (suspected) exposure to covid-19: Secondary | ICD-10-CM | POA: Insufficient documentation

## 2019-06-02 LAB — SARS CORONAVIRUS 2 (TAT 6-24 HRS): SARS Coronavirus 2: NEGATIVE

## 2019-06-03 ENCOUNTER — Encounter: Payer: Self-pay | Admitting: Gastroenterology

## 2019-06-04 ENCOUNTER — Encounter
Admission: RE | Disposition: A | Discharge status: Home / Self Care | Payer: Self-pay | Source: Home / Self Care | Attending: Gastroenterology

## 2019-06-04 ENCOUNTER — Ambulatory Visit: Payer: Medicare HMO | Admitting: Anesthesiology

## 2019-06-04 ENCOUNTER — Encounter: Payer: Self-pay | Admitting: Gastroenterology

## 2019-06-04 ENCOUNTER — Ambulatory Visit
Admission: RE | Admit: 2019-06-04 | Discharge: 2019-06-04 | Disposition: A | Discharge status: Home / Self Care | Payer: Medicare HMO | Attending: Gastroenterology | Admitting: Gastroenterology

## 2019-06-04 ENCOUNTER — Other Ambulatory Visit: Payer: Self-pay

## 2019-06-04 DIAGNOSIS — E611 Iron deficiency: Secondary | ICD-10-CM

## 2019-06-04 DIAGNOSIS — D122 Benign neoplasm of ascending colon: Secondary | ICD-10-CM | POA: Insufficient documentation

## 2019-06-04 DIAGNOSIS — K635 Polyp of colon: Secondary | ICD-10-CM

## 2019-06-04 DIAGNOSIS — K449 Diaphragmatic hernia without obstruction or gangrene: Secondary | ICD-10-CM | POA: Insufficient documentation

## 2019-06-04 DIAGNOSIS — Z96652 Presence of left artificial knee joint: Secondary | ICD-10-CM | POA: Diagnosis not present

## 2019-06-04 DIAGNOSIS — I10 Essential (primary) hypertension: Secondary | ICD-10-CM | POA: Diagnosis not present

## 2019-06-04 DIAGNOSIS — D500 Iron deficiency anemia secondary to blood loss (chronic): Secondary | ICD-10-CM

## 2019-06-04 DIAGNOSIS — D509 Iron deficiency anemia, unspecified: Secondary | ICD-10-CM | POA: Insufficient documentation

## 2019-06-04 DIAGNOSIS — K641 Second degree hemorrhoids: Secondary | ICD-10-CM | POA: Diagnosis not present

## 2019-06-04 DIAGNOSIS — M1990 Unspecified osteoarthritis, unspecified site: Secondary | ICD-10-CM | POA: Insufficient documentation

## 2019-06-04 DIAGNOSIS — K219 Gastro-esophageal reflux disease without esophagitis: Secondary | ICD-10-CM | POA: Diagnosis not present

## 2019-06-04 DIAGNOSIS — K2900 Acute gastritis without bleeding: Secondary | ICD-10-CM | POA: Diagnosis not present

## 2019-06-04 DIAGNOSIS — F329 Major depressive disorder, single episode, unspecified: Secondary | ICD-10-CM | POA: Insufficient documentation

## 2019-06-04 DIAGNOSIS — K621 Rectal polyp: Secondary | ICD-10-CM

## 2019-06-04 DIAGNOSIS — Z79899 Other long term (current) drug therapy: Secondary | ICD-10-CM | POA: Diagnosis not present

## 2019-06-04 DIAGNOSIS — D123 Benign neoplasm of transverse colon: Secondary | ICD-10-CM | POA: Insufficient documentation

## 2019-06-04 DIAGNOSIS — K2980 Duodenitis without bleeding: Secondary | ICD-10-CM | POA: Diagnosis not present

## 2019-06-04 DIAGNOSIS — E1140 Type 2 diabetes mellitus with diabetic neuropathy, unspecified: Secondary | ICD-10-CM | POA: Diagnosis not present

## 2019-06-04 DIAGNOSIS — K2970 Gastritis, unspecified, without bleeding: Secondary | ICD-10-CM | POA: Diagnosis not present

## 2019-06-04 DIAGNOSIS — K317 Polyp of stomach and duodenum: Secondary | ICD-10-CM | POA: Insufficient documentation

## 2019-06-04 DIAGNOSIS — E785 Hyperlipidemia, unspecified: Secondary | ICD-10-CM | POA: Diagnosis not present

## 2019-06-04 DIAGNOSIS — D128 Benign neoplasm of rectum: Secondary | ICD-10-CM | POA: Insufficient documentation

## 2019-06-04 HISTORY — PX: ESOPHAGOGASTRODUODENOSCOPY (EGD) WITH PROPOFOL: SHX5813

## 2019-06-04 HISTORY — PX: COLONOSCOPY WITH PROPOFOL: SHX5780

## 2019-06-04 LAB — GLUCOSE, CAPILLARY: Glucose-Capillary: 124 mg/dL — ABNORMAL HIGH (ref 70–99)

## 2019-06-04 SURGERY — COLONOSCOPY WITH PROPOFOL
Anesthesia: General

## 2019-06-04 MED ORDER — PROPOFOL 500 MG/50ML IV EMUL
INTRAVENOUS | Status: DC | PRN
  Administered 2019-06-04: 100 ug/kg/min via INTRAVENOUS

## 2019-06-04 MED ORDER — PROPOFOL 10 MG/ML IV BOLUS
INTRAVENOUS | Status: AC
  Filled 2019-06-04: qty 20

## 2019-06-04 MED ORDER — PROPOFOL 10 MG/ML IV BOLUS
INTRAVENOUS | Status: DC | PRN
  Administered 2019-06-04: 90 mg via INTRAVENOUS

## 2019-06-04 MED ORDER — SODIUM CHLORIDE 0.9 % IV SOLN: INTRAVENOUS | Status: DC

## 2019-06-04 MED ORDER — LIDOCAINE HCL (CARDIAC) PF 100 MG/5ML IV SOSY
PREFILLED_SYRINGE | INTRAVENOUS | Status: DC | PRN
  Administered 2019-06-04: 50 mg via INTRAVENOUS

## 2019-06-04 MED ORDER — PROPOFOL 500 MG/50ML IV EMUL
INTRAVENOUS | Status: AC
  Filled 2019-06-04: qty 50

## 2019-06-04 NOTE — Anesthesia Preprocedure Evaluation (Signed)
Anesthesia Evaluation  Patient identified by MRN, date of birth, ID band Patient awake    Reviewed: Allergy & Precautions, NPO status , Patient's Chart, lab work & pertinent test results, reviewed documented beta blocker date and time   History of Anesthesia Complications (+) PONV, Family history of anesthesia reaction and history of anesthetic complications  Airway Mallampati: III  TM Distance: >3 FB     Dental  (+) Dental Advidsory Given, Edentulous Upper, Upper Dentures   Pulmonary neg pulmonary ROS,           Cardiovascular hypertension, Pt. on medications and Pt. on home beta blockers (-) angina+ CAD  (-) Past MI and (-) Cardiac Stents (-) dysrhythmias + Valvular Problems/Murmurs      Neuro/Psych  Headaches, neg Seizures PSYCHIATRIC DISORDERS Depression  Neuromuscular disease    GI/Hepatic Neg liver ROS, hiatal hernia, PUD, GERD  Controlled,  Endo/Other  diabetes  Renal/GU negative Renal ROS     Musculoskeletal  (+) Arthritis ,   Abdominal   Peds  Hematology  (+) Blood dyscrasia, anemia ,   Anesthesia Other Findings Past Medical History: No date: Anemia No date: Arthritis 06/13/2017: Breast cancer ([place])     Comment:  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  Mastectomy/              SLN. No date: Chronic headache No date: Complication of anesthesia     Comment:  prior to 1991 used to have PONV.  none recently. No date: Diverticulosis of colon No date: Family history of adverse reaction to anesthesia     Comment:  mother/daighter get sick No date: GERD with stricture No date: Hard of hearing No date: Heart murmur     Comment:  followed by PCP No date: Hiatal hernia No date: Hypertension No date: Neuropathy     Comment:  feet No date: Osteoporosis No date: Pre-diabetes No date: Wears dentures     Comment:  full upper   Reproductive/Obstetrics negative OB ROS                             Anesthesia Physical  Anesthesia Plan  ASA: III  Anesthesia Plan: General   Post-op Pain Management:    Induction: Intravenous  PONV Risk Score and Plan: 4 or greater and Propofol infusion and TIVA  Airway Management Planned: Natural Airway and Nasal Cannula  Additional Equipment:   Intra-op Plan:   Post-operative Plan:   Informed Consent: I have reviewed the patients History and Physical, chart, labs and discussed the procedure including the risks, benefits and alternatives for the proposed anesthesia with the patient or authorized representative who has indicated his/her understanding and acceptance.       Plan Discussed with: CRNA  Anesthesia Plan Comments:         Anesthesia Quick Evaluation

## 2019-06-04 NOTE — Transfer of Care (Signed)
Immediate Anesthesia Transfer of Care Note  Patient: MAKENA GUEVARA  Procedure(s) Performed: COLONOSCOPY WITH PROPOFOL (N/A ) ESOPHAGOGASTRODUODENOSCOPY (EGD) WITH PROPOFOL (N/A )  Patient Location: Endoscopy Unit  Anesthesia Type:General  Level of Consciousness: drowsy and patient cooperative  Airway & Oxygen Therapy: Patient Spontanous Breathing  Post-op Assessment: Report given to RN and Post -op Vital signs reviewed and stable  Post vital signs: Reviewed and stable  Last Vitals:  Vitals Value Taken Time  BP 136/68 06/04/19 0913  Temp    Pulse 78 06/04/19 0913  Resp 18 06/04/19 0913  SpO2 95 % 06/04/19 0913  Vitals shown include unvalidated device data.  Last Pain:  Vitals:   06/04/19 0754  TempSrc: Temporal  PainSc: 0-No pain         Complications: No apparent anesthesia complications

## 2019-06-04 NOTE — Anesthesia Postprocedure Evaluation (Signed)
Anesthesia Post Note  Patient: Ashley Ryan  Procedure(s) Performed: COLONOSCOPY WITH PROPOFOL (N/A ) ESOPHAGOGASTRODUODENOSCOPY (EGD) WITH PROPOFOL (N/A )  Patient location during evaluation: Endoscopy Anesthesia Type: General Level of consciousness: awake and alert Pain management: pain level controlled Vital Signs Assessment: post-procedure vital signs reviewed and stable Respiratory status: spontaneous breathing, nonlabored ventilation, respiratory function stable and patient connected to nasal cannula oxygen Cardiovascular status: blood pressure returned to baseline and stable Postop Assessment: no apparent nausea or vomiting Anesthetic complications: no     Last Vitals:  Vitals:   06/04/19 0929 06/04/19 0939  BP: (!) 150/76 (!) 170/80  Pulse:    Resp:    Temp:    SpO2:      Last Pain:  Vitals:   06/04/19 0939  TempSrc:   PainSc: 0-No pain                 Martha Clan

## 2019-06-04 NOTE — Op Note (Addendum)
[facility] Gastroenterology Patient Name: Ashley Ryan Procedure Date: 06/04/2019 8:26 AM MRN: [id number] Account #: [id number] Date of Birth: [birth year] Admit Type: Outpatient Age: 74 Room: [facility] ENDO ROOM 4 Gender: Female Note Status: Finalized Procedure:             Colonoscopy Indications:           Iron deficiency anemia Providers:             Lucilla Lame MD, MD Referring MD:          [name] MD, MD (Referring MD) Medicines:             Propofol per Anesthesia Complications:         No immediate complications. Procedure:             Pre-Anesthesia Assessment:                        - Prior to the procedure, a History and Physical was                         performed, and patient medications and allergies were                         reviewed. The patient's tolerance of previous                         anesthesia was also reviewed. The risks and benefits                         of the procedure and the sedation options and risks                         were discussed with the patient. All questions were                         answered, and informed consent was obtained. Prior                         Anticoagulants: The patient has taken no previous                         anticoagulant or antiplatelet agents. ASA Grade                         Assessment: II - A patient with mild systemic disease.                         After reviewing the risks and benefits, the patient                         was deemed in satisfactory condition to undergo the                         procedure.                        After obtaining informed consent, the colonoscope was  passed under direct vision. Throughout the procedure,                         the patient's blood pressure, pulse, and oxygen                         saturations were monitored continuously. The                         Colonoscope was introduced through the anus and                advanced to the the cecum, identified by appendiceal                         orifice and ileocecal valve. The colonoscopy was                         performed without difficulty. The patient tolerated                         the procedure well. The quality of the bowel                         preparation was excellent. Findings:      The perianal and digital rectal examinations were normal.      Two sessile polyps were found in the transverse colon. The polyps were 6       to 8 mm in size. These polyps were removed with a cold snare. Resection       and retrieval were complete.      A 6 mm polyp was found in the rectum. The polyp was sessile. The polyp       was removed with a cold snare. Resection and retrieval were complete. To       prevent bleeding post-intervention, one hemostatic clip was successfully       placed (MR conditional). There was no bleeding at the end of the       procedure.      A 3 mm polyp was found in the ascending colon. The polyp was sessile.       The polyp was removed with a cold snare. Resection and retrieval were       complete.      Non-bleeding internal hemorrhoids were found during retroflexion. The       hemorrhoids were Grade II (internal hemorrhoids that prolapse but reduce       spontaneously).      Multiple small-mouthed diverticula were found in the sigmoid colon. Impression:            - Two 6 to 8 mm polyps in the transverse colon,                         removed with a cold snare. Resected and retrieved.                        - One 6 mm polyp in the rectum, removed with a cold                         snare. Resected and retrieved. Clip (MR conditional)  was placed.                        - One 3 mm polyp in the ascending colon, removed with                         a cold snare. Resected and retrieved.                        - Non-bleeding internal hemorrhoids.                        - Diverticulosis in the  sigmoid colon. Recommendation:        - Discharge patient to home.                        - Resume previous diet.                        - Continue present medications. Procedure Code(s):     --- Professional ---                        [phone], Colonoscopy, flexible; with removal of                         tumor(s), polyp(s), or other lesion(s) by snare                         technique Diagnosis Code(s):     --- Professional ---                        D50.9, Iron deficiency anemia, unspecified                        K63.5, Polyp of colon                        K62.1, Rectal polyp CPT copyright 2019 American Medical Association. All rights reserved. The codes documented in this report are preliminary and upon coder review may  be revised to meet current compliance requirements. Lucilla Lame MD, MD 06/04/2019 9:09:37 AM This report has been signed electronically. Number of Addenda: 0 Note Initiated On: 06/04/2019 8:26 AM Scope Withdrawal Time: 0 hours 6 minutes 41 seconds  Total Procedure Duration: 0 hours 16 minutes 50 seconds  Estimated Blood Loss:  Estimated blood loss: none. Estimated blood loss: none.      [facility]

## 2019-06-04 NOTE — H&P (Signed)
Ashley Lame, MD [address] [place], [place] [address] [phone] Fax : [phone]  Primary Care Physician:  [name], MD Primary Gastroenterologist:  Dr. Allen Norris  Pre-Procedure History & Physical: HPI:  Ashley Ryan is a 74 y.o. female is here for an endoscopy and colonoscopy.   Past Medical History:  Diagnosis Date  . Anemia   . Arthritis   . 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  Mastectomy/ SLN.  Marland Kitchen Chronic headache   . Complication of anesthesia    prior to 1991 used to have PONV.  none recently.  . Diverticulosis of colon   . Family history of adverse reaction to anesthesia    mother/daighter get sick  . GERD with stricture   . Hard of hearing   . Heart murmur    followed by PCP  . Hiatal hernia   . Hypertension   . Neuropathy    feet  . Osteoporosis   . Pre-diabetes   . Wears dentures    full upper    Past Surgical History:  Procedure Laterality Date  . ABDOMINAL HYSTERECTOMY  1978   one ovary remains  . 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 CARDIAC CATHETERIZATION     "yrs ago" all OK.  Marland Kitchen CATARACT EXTRACTION W/PHACO Left 02/21/2016   Procedure: CATARACT EXTRACTION PHACO AND INTRAOCULAR LENS PLACEMENT ([place])  Left eye;  Surgeon: Leandrew Koyanagi, MD;  Location: [facility];  Service: Ophthalmology;  Laterality: Left;  Left eye Diabetic  . CATARACT EXTRACTION W/PHACO Right 03/13/2016   Procedure: CATARACT EXTRACTION PHACO AND INTRAOCULAR LENS PLACEMENT ([facility])  right  diabetic;  Surgeon: Leandrew Koyanagi, MD;  Location: [facility];  Service: Ophthalmology;  Laterality: Right;  diabetic  . CHOLECYSTECTOMY  1978  . COLONOSCOPY W/ POLYPECTOMY  09/09/2013   8 mm tubular adenoma of the cecum.  Erskine Emery, MD. [facility]  . JOINT REPLACEMENT Left    knee   .  LEFT HEART CATH AND CORONARY ANGIOGRAPHY N/A 11/06/2017   Procedure: LEFT HEART CATH AND CORONARY ANGIOGRAPHY;  Surgeon: Nelva Bush, MD;  Location: [facility];  Service: Cardiovascular;  Laterality: N/A;  . MASTECTOMY Left 06/13/2017   mastectomy neg margins  . OOPHORECTOMY     one still left  . RENAL ANGIOGRAPHY  11/06/2017   Procedure: RENAL ANGIOGRAPHY;  Surgeon: Nelva Bush, MD;  Location: [facility];  Service: Cardiovascular;;  . SENTINEL NODE BIOPSY Left 06/13/2017   Procedure: SENTINEL NODE BIOPSY;  Surgeon: Robert Bellow, MD;  Location: [facility];  Service: General;  Laterality: Left;  . 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  Surgeon: Robert Bellow, MD;  Location: [facility];  Service: General;  Laterality: Left;    Prior to Admission medications   Medication Sig Start Date End Date Taking? Authorizing Provider  Alcohol Swabs (B-D SINGLE USE SWABS REGULAR) PADS Use to check blood sugar once daily 05/27/19  Yes Bedsole, Amy E, MD  amLODipine (NORVASC) 10 MG tablet  03/09/19  Yes [provider]  amLODipine (NORVASC) 5 MG tablet Take 1.5 tablets (7.5 mg total) by mouth daily. 01/01/19  Yes Dunn, Areta Haber, PA-C  atorvastatin (LIPITOR) 40 MG tablet TAKE 1 TABLET AT BEDTIME 05/05/19  Yes  Bedsole, Amy E, MD  Blood Glucose Calibration (ACCU-CHEK AVIVA) SOLN Use to check blood sugar once daily 10/27/18  Yes Bedsole, Amy E, MD  Blood Glucose Calibration (TRUE METRIX LEVEL 1) Low SOLN Use as directed 05/27/19  Yes Bedsole, Amy E, MD  Blood Glucose Monitoring Suppl (TRUE METRIX AIR GLUCOSE METER) w/Device KIT Use to check blood sugar daily 05/27/19  Yes Bedsole, Amy E, MD  Calcium Carbonate-Vitamin D (CALCIUM 600+D) 600-400 MG-UNIT per tablet Take 1 tablet by mouth 2 (two) times daily.    Yes [provider]  cloNIDine (CATAPRES) 0.1 MG tablet Take 1 tablet (0.1 mg total) by  mouth 2 (two) times daily as needed (Systolic blood pressure greater than 180). 11/03/17 01/01/28 Yes Paduchowski, Lennette Bihari, MD  diclofenac Sodium (VOLTAREN) 1 % GEL APPLY 2 GRAMS  TOPICALLY TO THE AFFECTED AREA 4 (FOUR) TIMES DAILY. 05/05/19  Yes Bedsole, Amy E, MD  esomeprazole (NEXIUM) 40 MG capsule TAKE 1 CAPSULE 2 (TWO) TIMES DAILY BEFORE A MEAL. 02/17/19  Yes Bedsole, Amy E, MD  FLUoxetine (PROZAC) 20 MG capsule TAKE 1 CAPSULE EVERY DAY 02/17/19  Yes Bedsole, Amy E, MD  fluticasone (FLONASE) 50 MCG/ACT nasal spray USE 2 SPRAYS INTO THE NOSE DAILY AS DIRECTED (SUBSTITUTED FOR FLONASE) 10/26/18  Yes Bedsole, Amy E, MD  furosemide (LASIX) 20 MG tablet TAKE 1 TABLET BY MOUTH EVERY DAY AS NEEDED 01/15/18  Yes Bedsole, Amy E, MD  gabapentin (NEURONTIN) 100 MG capsule TAKE 1 CAPSULE IN THE MORNING, 1 CAPSULE AT LUNCH, 1 CAPSULES AT DINNER AND 2 TO 3 CAPSULES AT BEDTIME 11/20/18  Yes Bedsole, Amy E, MD  Glucosamine 500 MG TABS Take 500 mg by mouth daily.    Yes [provider]  glucose blood (TRUE METRIX BLOOD GLUCOSE TEST) test strip Use to check blood sugar daily 05/27/19  Yes Bedsole, Amy E, MD  isosorbide mononitrate (IMDUR) 30 MG 24 hr tablet Take 1 tablet (30 mg total) by mouth daily. 10/27/18  Yes Bedsole, Amy E, MD  latanoprost (XALATAN) 0.005 % ophthalmic solution Place 1 drop into both eyes at bedtime.   Yes [provider]  lidocaine (LIDODERM) 5 % APPLY 1 PATCH ONTO THE SKIN EVERY DAY. REMOVE AND DISCARD PATCH WITHIN 12 HOURS OR AS DIRECTED BY PHYSICIAN. 08/06/18  Yes Bedsole, Amy E, MD  loratadine (CLARITIN) 10 MG tablet Take 10 mg by mouth daily.   Yes [provider]  losartan-hydrochlorothiazide (HYZAAR) 100-25 MG tablet TAKE 1 TABLET EVERY DAY 05/05/19  Yes Bedsole, Amy E, MD  Omega-3 Fatty Acids (FISH OIL) 1000 MG CAPS Take 1,000 mg by mouth daily.    Yes [provider]  polycarbophil (FIBERCON) 625 MG tablet Take 625 mg by mouth daily.   Yes [provider]  potassium chloride SA (KLOR-CON) 20 MEQ tablet Take 2 tablets (40 mEq total) by mouth daily. 05/28/19  Yes End, Harrell Gave, MD  tamoxifen (NOLVADEX) 20 MG tablet Take 1 tablet (20 mg total) by mouth daily. 08/29/17  Yes Karen Kitchens, NP  tolterodine (DETROL LA) 4 MG 24 hr capsule Take 1 capsule (4 mg total) by mouth daily. 12/31/18  Yes Bedsole, Amy E, MD  traMADol (ULTRAM) 50 MG tablet Take 1 tablet (50 mg total) by mouth every 6 (six) hours as needed. for pain 05/06/19  Yes Bedsole, Amy E, MD  TRUEplus Lancets 28G MISC Use to check blood sugar daily 05/27/19  Yes [name], MD    Allergies as of 04/30/2019 -  Review Complete 04/29/2019  Allergen Reaction Noted  . Sulfa antibiotics Rash 09/09/2013  . Sulfonamide derivatives Rash     Family History  Problem Relation Age of Onset  . Breast cancer Other   . Stomach cancer Other   . Diabetes Other   . Heart disease Other   . Breast cancer Sister 63  . Esophageal cancer Brother   . Hypertension Mother   . Heart attack Mother 75  . Hypertension Father   . Coronary artery disease Father 106       CABG  . Stroke Father     Social History   Socioeconomic History  . Marital status: Married    Spouse name: Not on file  . Number of children: 2  . Years of education: Not on file  . Highest education level: Not on file  Occupational History  . Occupation: retired Forensic psychologist: RETIRED  Tobacco Use  . Smoking status: Never Smoker  . Smokeless tobacco: Never Used  Substance and Sexual Activity  . Alcohol use: No  . Drug use: No  . Sexual activity: Not Currently  Other Topics Concern  . Not on file  Social History Narrative   Drinks sweet tea, diet sodas 4 a day   No living will, full code (reviewed 2014)   Social Determinants of Health   Financial Resource Strain: Low Risk   . Difficulty of Paying Living Expenses: Not hard at all  Food Insecurity: No Food Insecurity  . Worried About Charity fundraiser in  the Last Year: Never true  . Ran Out of Food in the Last Year: Never true  Transportation Needs: No Transportation Needs  . Lack of Transportation (Medical): No  . Lack of Transportation (Non-Medical): No  Physical Activity: Inactive  . Days of Exercise per Week: 0 days  . Minutes of Exercise per Session: 0 min  Stress: No Stress Concern Present  . Feeling of Stress : Not at all  Social Connections:   . Frequency of Communication with Friends and Family:   . Frequency of Social Gatherings with Friends and Family:   . Attends Religious Services:   . Active Member of Clubs or Organizations:   . Attends Archivist Meetings:   Marland Kitchen Marital Status:   Intimate Partner Violence: Not At Risk  . Fear of Current or Ex-Partner: No  . Emotionally Abused: No  . Physically Abused: No  . Sexually Abused: No    Review of Systems: See HPI, otherwise negative ROS  Physical Exam: BP (!) 146/81   Pulse 86   Temp 98.3 F (36.8 C) (Temporal)   Resp 16   Ht 5' (1.524 m)   Wt 65.8 kg   SpO2 100%   BMI 28.32 kg/m  General:   Alert,  pleasant and cooperative in NAD Head:  Normocephalic and atraumatic. Neck:  Supple; no masses or thyromegaly. Lungs:  Clear throughout to auscultation.    Heart:  Regular rate and rhythm. Abdomen:  Soft, nontender and nondistended. Normal bowel sounds, without guarding, and without rebound.   Neurologic:  Alert and  oriented x4;  grossly normal neurologically.  Impression/Plan: BARI LEIB is here for an endoscopy and colonoscopy to be performed for IDA  Risks, benefits, limitations, and alternatives regarding  endoscopy and colonoscopy have been reviewed with the patient.  Questions have been answered.  All parties agreeable.   Ashley Lame, MD  06/04/2019, 7:58 AM

## 2019-06-04 NOTE — Op Note (Addendum)
[facility] Patient Name: Ashley Ryan Procedure Date: 06/04/2019 8:27 AM MRN: [id number] Account #: [id number] Date of Birth: 1945-05-09 Admit Type: Outpatient Age: 74 Room: [facility] ENDO ROOM 4 Gender: Female Note Status: Finalized Procedure:             Upper GI endoscopy Indications:           Iron deficiency anemia Providers:             Lucilla Lame MD, MD Referring MD:          [name] MD, MD (Referring MD) Medicines:             Propofol per Anesthesia Complications:         No immediate complications. Procedure:             Pre-Anesthesia Assessment:                        - Prior to the procedure, a History and Physical was                         performed, and patient medications and allergies were                         reviewed. The patient's tolerance of previous                         anesthesia was also reviewed. The risks and benefits                         of the procedure and the sedation options and risks                         were discussed with the patient. All questions were                         answered, and informed consent was obtained. Prior                         Anticoagulants: The patient has taken no previous                         anticoagulant or antiplatelet agents. ASA Grade                         Assessment: II - A patient with mild systemic disease.                         After reviewing the risks and benefits, the patient                         was deemed in satisfactory condition to undergo the                         procedure.                        After obtaining informed consent, the endoscope was  passed under direct vision. Throughout the procedure,                         the patient's blood pressure, pulse, and oxygen                         saturations were monitored continuously. The Endoscope                         was introduced through the mouth, and  advanced to the                         second part of duodenum. The upper GI endoscopy was                         accomplished without difficulty. The patient tolerated                         the procedure well. Findings:      A medium-sized hiatal hernia was present.      Localized moderate inflammation characterized by erythema was found in       the entire examined stomach. Biopsies were taken with a cold forceps for       histology.      One 6 mm sessile polyp with no bleeding was found in the duodenal bulb.       The polyp was removed with a cold biopsy forceps. Resection and       retrieval were complete. Impression:            - Medium-sized hiatal hernia.                        - Gastritis. Biopsied.                        - One duodenal polyp. Resected and retrieved. Recommendation:        - Discharge patient to home.                        - Resume previous diet.                        - Continue present medications.                        - Await pathology results.                        - Perform a colonoscopy today. Procedure Code(s):     --- Professional ---                        [phone], Esophagogastroduodenoscopy, flexible,                         transoral; with biopsy, single or multiple Diagnosis Code(s):     --- Professional ---                        D50.9, Iron deficiency anemia, unspecified  K29.70, Gastritis, unspecified, without bleeding CPT copyright 2019 American Medical Association. All rights reserved. The codes documented in this report are preliminary and upon coder review may  be revised to meet current compliance requirements. Lucilla Lame MD, MD 06/04/2019 8:45:57 AM This report has been signed electronically. Number of Addenda: 0 Note Initiated On: 06/04/2019 8:27 AM Estimated Blood Loss:  Estimated blood loss: none. Estimated blood loss: none.      [facility]

## 2019-06-07 ENCOUNTER — Encounter: Payer: Self-pay | Admitting: Gastroenterology

## 2019-06-07 LAB — SURGICAL PATHOLOGY

## 2019-06-08 ENCOUNTER — Inpatient Hospital Stay: Payer: Medicare HMO | Attending: Hematology and Oncology

## 2019-06-08 ENCOUNTER — Other Ambulatory Visit: Payer: Self-pay

## 2019-06-08 DIAGNOSIS — E538 Deficiency of other specified B group vitamins: Secondary | ICD-10-CM | POA: Diagnosis not present

## 2019-06-08 MED ORDER — CYANOCOBALAMIN 1000 MCG/ML IJ SOLN
1000.0000 ug | Freq: Once | INTRAMUSCULAR | Status: AC
  Administered 2019-06-08: 1000 ug via INTRAMUSCULAR

## 2019-06-11 ENCOUNTER — Ambulatory Visit: Payer: Medicare HMO

## 2019-06-23 ENCOUNTER — Telehealth: Payer: Self-pay | Admitting: Family Medicine

## 2019-06-23 DIAGNOSIS — F330 Major depressive disorder, recurrent, mild: Secondary | ICD-10-CM

## 2019-06-23 DIAGNOSIS — E538 Deficiency of other specified B group vitamins: Secondary | ICD-10-CM

## 2019-06-23 DIAGNOSIS — E1140 Type 2 diabetes mellitus with diabetic neuropathy, unspecified: Secondary | ICD-10-CM

## 2019-06-23 NOTE — Telephone Encounter (Signed)
-----   Message from Cloyd Stagers, RT sent at 06/09/2019  1:33 PM EDT ----- Regarding: Lab Orders for Thursday 6.3.2021 Please place lab orders for Thursday 6.3.2021, appt notes state "labs for f/u"" Thank you, Dyke Maes RT(R)

## 2019-06-24 ENCOUNTER — Other Ambulatory Visit (INDEPENDENT_AMBULATORY_CARE_PROVIDER_SITE_OTHER): Payer: Medicare HMO

## 2019-06-24 DIAGNOSIS — E1140 Type 2 diabetes mellitus with diabetic neuropathy, unspecified: Secondary | ICD-10-CM | POA: Diagnosis not present

## 2019-06-24 DIAGNOSIS — E538 Deficiency of other specified B group vitamins: Secondary | ICD-10-CM | POA: Diagnosis not present

## 2019-06-24 DIAGNOSIS — E876 Hypokalemia: Secondary | ICD-10-CM | POA: Diagnosis not present

## 2019-06-24 LAB — COMPREHENSIVE METABOLIC PANEL
ALT: 8 U/L (ref 0–35)
AST: 11 U/L (ref 0–37)
Albumin: 3.9 g/dL (ref 3.5–5.2)
Alkaline Phosphatase: 46 U/L (ref 39–117)
BUN: 8 mg/dL (ref 6–23)
CO2: 30 mEq/L (ref 19–32)
Calcium: 8.7 mg/dL (ref 8.4–10.5)
Chloride: 101 mEq/L (ref 96–112)
Creatinine, Ser: 0.6 mg/dL (ref 0.40–1.20)
GFR: 97.18 mL/min (ref >60.00)
Glucose, Bld: 79 mg/dL (ref 70–99)
Potassium: 2.9 mEq/L — ABNORMAL LOW (ref 3.5–5.1)
Sodium: 139 mEq/L (ref 135–145)
Total Bilirubin: 0.3 mg/dL (ref 0.2–1.2)
Total Protein: 6.1 g/dL (ref 6.0–8.3)

## 2019-06-24 LAB — HEMOGLOBIN A1C: Hgb A1c MFr Bld: 6.2 % (ref 4.6–6.5)

## 2019-06-24 LAB — LIPID PANEL
Cholesterol: 128 mg/dL (ref 0–200)
HDL: 50.4 mg/dL (ref >39.00)
LDL Cholesterol: 55 mg/dL (ref 0–99)
NonHDL: 77.84
Total CHOL/HDL Ratio: 3
Triglycerides: 113 mg/dL (ref 0.0–149.0)
VLDL: 22.6 mg/dL (ref 0.0–40.0)

## 2019-06-24 LAB — VITAMIN B12: Vitamin B-12: 607 pg/mL (ref 211–911)

## 2019-06-24 LAB — MAGNESIUM: Magnesium: 1.8 mg/dL (ref 1.5–2.5)

## 2019-06-25 ENCOUNTER — Other Ambulatory Visit: Payer: Self-pay | Admitting: Family Medicine

## 2019-06-25 DIAGNOSIS — E876 Hypokalemia: Secondary | ICD-10-CM

## 2019-06-27 NOTE — Progress Notes (Signed)
Ashley Quincy T. Monzerrat Wellen, MD, [place] at [facility] [place], [address]  Phone: [phone]  FAX: [phone]  Ashley Ryan - 74 y.o. female  MRN [id number]  Date of Birth: 03-23-1945  Date: 06/28/2019  PCP: [name], MD  Referral: [name], MD  Chief Complaint  Patient presents with  . Knee Pain    Right    This visit occurred during the SARS-CoV-2 public health emergency.  Safety protocols were in place, including screening questions prior to the visit, additional usage of staff PPE, and extensive cleaning of exam room while observing appropriate contact time as indicated for disinfecting solutions.   Subjective:   Ashley Ryan is a 74 y.o. very pleasant female patient with Body mass index is 28.9 kg/m. who presents with the following:  She is a very pleasant lady who I recall quite well. She has some end-stage degenerative joint disease in the right knee.  I given her a number of steroid injections in the past, and this gave her at least some level of some symptomatic relief.  Knee OA is bad.  She also has had an excellent response to viscosupplementation in the past.  She is interested in doing this and trying this before having any form of surgery.  CHECK ABOUT VISCO  Review of Systems is noted in the HPI, as appropriate   Objective:   BP 128/66   Pulse 80   Temp 98 F (36.7 C) (Temporal)   Ht 5' (1.524 m)   Wt 148 lb (67.1 kg)   SpO2 95%   BMI 28.90 kg/m   GEN: No acute distress; alert,appropriate. PULM: Breathing comfortably in no respiratory distress PSYCH: Normally interactive.    Examined in a seated position.  She does have medial and lateral joint line tenderness on the right.  ACL and PCL as well as the LCL and MCL are intact.  Seated Lachman's is pain provoking without mechanical symptoms.  Radiology: CLINICAL DATA:  Right knee  pain.  EXAM: RIGHT KNEE - COMPLETE 4+ VIEW  COMPARISON:  None.  FINDINGS: No fracture or dislocation. Severe degenerative change involving the lateral compartment of the right knee with near complete joint space loss, bone-on-bone articulation, subchondral sclerosis and osteophytosis.  Mild degenerative change involving the medial compartment and patellofemoral joints with joint space loss, subchondral sclerosis osteophytosis.  Small knee joint effusion. No evidence of chondrocalcinosis. Regional soft tissues appear normal.  IMPRESSION: Severe degenerative change involving the lateral compartment of the right knee.   Electronically Signed   By: Sandi Mariscal M.D.   On: 04/30/2018 15:46  Assessment and Plan:     ICD-10-CM   1. Primary osteoarthritis of right knee  M17.11   2. Controlled type 2 diabetes with neuropathy (HCC)  E11.40   3. Hypokalemia  E87.6 Magnesium    Potassium   Intra-articular steroid injection today for symptomatic relief.  We will also check on viscosupplementation for the patient.  Aspiration/Injection Procedure Note Ashley Ryan 09/10/1945 Date of procedure: 06/28/2019  Procedure: Large Joint Aspiration / Injection of Knee, R Indications: Pain  Procedure Details Patient verbally consented to procedure. Risks (including potential rare risk of infection), benefits, and alternatives explained. Sterilely prepped with Chloraprep. Ethyl cholride used for anesthesia. 8 cc Lidocaine 1% mixed with 2 mL Depo-Medrol 40 mg injected using the anteromedial approach without difficulty. No complications with procedure and tolerated well.  Patient had decreased pain post-injection. Medication: 2 mL of Depo-Medrol 40 mg, equaling Depo-Medrol 80 mg total   Follow-up: No follow-ups on file.  No orders of the defined types were placed in this encounter.  There are no discontinued medications. No orders of the defined types were placed in this  encounter.   Signed,  [name]. Malaika Arnall, MD   Outpatient Encounter Medications as of 06/28/2019  Medication Sig  . Alcohol Swabs (B-D SINGLE USE SWABS REGULAR) PADS Use to check blood sugar once daily  . amLODipine (NORVASC) 10 MG tablet   . amLODipine (NORVASC) 5 MG tablet Take 1.5 tablets (7.5 mg total) by mouth daily.  Marland Kitchen atorvastatin (LIPITOR) 40 MG tablet TAKE 1 TABLET AT BEDTIME  . Blood Glucose Calibration (ACCU-CHEK AVIVA) SOLN Use to check blood sugar once daily  . Blood Glucose Calibration (TRUE METRIX LEVEL 1) Low SOLN Use as directed  . Blood Glucose Monitoring Suppl (TRUE METRIX AIR GLUCOSE METER) w/Device KIT Use to check blood sugar daily  . Calcium Carbonate-Vitamin D (CALCIUM 600+D) 600-400 MG-UNIT per tablet Take 1 tablet by mouth 2 (two) times daily.   . cloNIDine (CATAPRES) 0.1 MG tablet Take 1 tablet (0.1 mg total) by mouth 2 (two) times daily as needed (Systolic blood pressure greater than 180).  . diclofenac Sodium (VOLTAREN) 1 % GEL APPLY 2 GRAMS  TOPICALLY TO THE AFFECTED AREA 4 (FOUR) TIMES DAILY.  Marland Kitchen esomeprazole (NEXIUM) 40 MG capsule TAKE 1 CAPSULE 2 (TWO) TIMES DAILY BEFORE A MEAL.  . ferrous sulfate 325 (65 FE) MG tablet Take 325 mg by mouth daily with breakfast.   . FLUoxetine (PROZAC) 20 MG capsule TAKE 1 CAPSULE EVERY DAY  . fluticasone (FLONASE) 50 MCG/ACT nasal spray USE 2 SPRAYS INTO THE NOSE DAILY AS DIRECTED (SUBSTITUTED FOR FLONASE)  . furosemide (LASIX) 20 MG tablet TAKE 1 TABLET BY MOUTH EVERY DAY AS NEEDED  . gabapentin (NEURONTIN) 100 MG capsule TAKE 1 CAPSULE IN THE MORNING, 1 CAPSULE AT LUNCH, 1 CAPSULES AT DINNER AND 2 TO 3 CAPSULES AT BEDTIME  . Glucosamine 500 MG TABS Take 500 mg by mouth daily.   Marland Kitchen glucose blood (TRUE METRIX BLOOD GLUCOSE TEST) test strip Use to check blood sugar daily  . isosorbide mononitrate (IMDUR) 30 MG 24 hr tablet Take 1 tablet (30 mg total) by mouth daily.  Marland Kitchen latanoprost (XALATAN) 0.005 % ophthalmic solution Place 1  drop into both eyes at bedtime.  . lidocaine (LIDODERM) 5 % APPLY 1 PATCH ONTO THE SKIN EVERY DAY. REMOVE AND DISCARD PATCH WITHIN 12 HOURS OR AS DIRECTED BY PHYSICIAN.  Marland Kitchen loratadine (CLARITIN) 10 MG tablet Take 10 mg by mouth daily.  Marland Kitchen losartan-hydrochlorothiazide (HYZAAR) 100-25 MG tablet TAKE 1 TABLET EVERY DAY  . Omega-3 Fatty Acids (FISH OIL) 1000 MG CAPS Take 1,000 mg by mouth daily.   . polycarbophil (FIBERCON) 625 MG tablet Take 625 mg by mouth daily.  . potassium chloride SA (KLOR-CON) 20 MEQ tablet Take 2 tablets (40 mEq total) by mouth daily.  . tamoxifen (NOLVADEX) 20 MG tablet Take 1 tablet (20 mg total) by mouth daily.  Marland Kitchen tolterodine (DETROL LA) 4 MG 24 hr capsule Take 1 capsule (4 mg total) by mouth daily.  . traMADol (ULTRAM) 50 MG tablet Take 1 tablet (50 mg total) by mouth every 6 (six) hours as needed. for pain  . TRUEplus Lancets 28G MISC Use to check blood sugar daily   No facility-administered encounter medications on file as of 06/28/2019.

## 2019-06-28 ENCOUNTER — Ambulatory Visit (INDEPENDENT_AMBULATORY_CARE_PROVIDER_SITE_OTHER): Payer: Medicare HMO | Admitting: Family Medicine

## 2019-06-28 ENCOUNTER — Other Ambulatory Visit: Payer: Self-pay

## 2019-06-28 ENCOUNTER — Encounter: Payer: Self-pay | Admitting: Family Medicine

## 2019-06-28 ENCOUNTER — Telehealth: Payer: Self-pay

## 2019-06-28 VITALS — BP 128/66 | HR 80 | Temp 98.0°F | Ht 60.0 in | Wt 148.0 lb

## 2019-06-28 DIAGNOSIS — E876 Hypokalemia: Secondary | ICD-10-CM

## 2019-06-28 DIAGNOSIS — E1140 Type 2 diabetes mellitus with diabetic neuropathy, unspecified: Secondary | ICD-10-CM | POA: Diagnosis not present

## 2019-06-28 DIAGNOSIS — M1711 Unilateral primary osteoarthritis, right knee: Secondary | ICD-10-CM | POA: Diagnosis not present

## 2019-06-28 LAB — MAGNESIUM: Magnesium: 2 mg/dL (ref 1.5–2.5)

## 2019-06-28 LAB — POTASSIUM: Potassium: 4.5 mEq/L (ref 3.5–5.1)

## 2019-06-28 MED ORDER — METHYLPREDNISOLONE ACETATE 40 MG/ML IJ SUSP
80.0000 mg | Freq: Once | INTRAMUSCULAR | Status: AC
  Administered 2019-06-28: 80 mg via INTRA_ARTICULAR

## 2019-06-28 NOTE — Telephone Encounter (Signed)
-----   Message from Randall An, RN sent at 06/28/2019  3:50 PM EDT ----- Regarding: FW: [facility] 06/28/19 Submitted new case on [email or web address]. Form signed by Dr. Lorelei Pont. Form faxed to [employer] at [phone] with insurance info and last office note. Larene Beach, RN ----- Message ----- From: Owens Loffler, MD Sent: 06/28/2019  11:15 AM EDT To: [place] Kitten, CMA, Diamond Nickel, RN, # Subject: [facility]                                          Can we check on viscosupplementation for this patient's right knee osteoarthritis.Thank you.

## 2019-06-28 NOTE — Telephone Encounter (Signed)
Submitted new case. Form signed by Dr. Lorelei Pont and faxed with insurance info and office note to [phone]

## 2019-06-28 NOTE — Addendum Note (Signed)
Addended by: [place] Kitten on: 06/28/2019 11:35 AM   Modules accepted: Orders

## 2019-07-01 ENCOUNTER — Encounter: Payer: Self-pay | Admitting: Family Medicine

## 2019-07-01 ENCOUNTER — Other Ambulatory Visit: Payer: Self-pay

## 2019-07-01 ENCOUNTER — Ambulatory Visit (INDEPENDENT_AMBULATORY_CARE_PROVIDER_SITE_OTHER): Payer: Medicare HMO | Admitting: Family Medicine

## 2019-07-01 VITALS — BP 120/70 | HR 81 | Temp 98.1°F | Ht 60.0 in | Wt 145.2 lb

## 2019-07-01 DIAGNOSIS — E1169 Type 2 diabetes mellitus with other specified complication: Secondary | ICD-10-CM | POA: Diagnosis not present

## 2019-07-01 DIAGNOSIS — I10 Essential (primary) hypertension: Secondary | ICD-10-CM | POA: Diagnosis not present

## 2019-07-01 DIAGNOSIS — E785 Hyperlipidemia, unspecified: Secondary | ICD-10-CM

## 2019-07-01 DIAGNOSIS — E1142 Type 2 diabetes mellitus with diabetic polyneuropathy: Secondary | ICD-10-CM

## 2019-07-01 DIAGNOSIS — I5032 Chronic diastolic (congestive) heart failure: Secondary | ICD-10-CM

## 2019-07-01 DIAGNOSIS — E1140 Type 2 diabetes mellitus with diabetic neuropathy, unspecified: Secondary | ICD-10-CM | POA: Diagnosis not present

## 2019-07-01 DIAGNOSIS — E876 Hypokalemia: Secondary | ICD-10-CM

## 2019-07-01 LAB — HM DIABETES FOOT EXAM

## 2019-07-01 MED ORDER — POTASSIUM CHLORIDE CRYS ER 20 MEQ PO TBCR
20.0000 meq | EXTENDED_RELEASE_TABLET | Freq: Every day | ORAL | 4 refills | Status: DC
Start: 2019-07-01 — End: 2019-09-24

## 2019-07-01 NOTE — Assessment & Plan Note (Signed)
Confusion about medicaitons but BP well controlled.  Given added benefit in DM with ACEI/ARB.Marland Kitchen will have her restart 50/12.5 mg daily of losartan HCTZ. Hold amlodipine.  If BP not at goal < 140/90.Marland Kitchen she will call and we will add back amlodipine.

## 2019-07-01 NOTE — Progress Notes (Signed)
Chief Complaint  Patient presents with  . Diabetes    History of Present Illness: HPI   74 year old female presents for 6 month DM follow up.  Diabetes:  Diet controlled. Lab Results  Component Value Date   HGBA1C 6.2 06/24/2019  Using medications without difficulties: Hypoglycemic episodes: Hyperglycemic episodes: Feet problems: Blood Sugars averaging: eye exam within last year:  Assocaited with neuropathy controlled on neurontin.  Hypertension:  A goal on current regimen but  has not been taking losartan HCTZ and taking less amlodipine? She is confused about her medications for HTN Today only took 1/2 tablet 10 mg of amlodipine  In last 2 months  Using clonidine prn... but not in a long time. She has lost some weight. BP Readings from Last 3 Encounters:  07/01/19 120/70  06/28/19 128/66  06/04/19 (!) 170/80  Using medication without problems or lightheadedness: none Chest pain with exertion:none Edema:none.. not requiring lasix often. Short of breath: none Average home BPs: Other issues: HFpEF    Wt Readings from Last 3 Encounters:  07/01/19 145 lb 4 oz (65.9 kg)  06/28/19 148 lb (67.1 kg)  06/04/19 145 lb (65.8 kg)    Elevated Cholesterol:LDL at goal on statin Lab Results  Component Value Date   CHOL 128 06/24/2019   HDL 50.40 06/24/2019   LDLCALC 55 06/24/2019   LDLDIRECT 102.5 07/21/2009   TRIG 113.0 06/24/2019   CHOLHDL 3 06/24/2019  Using medications without problems: Muscle aches:  Diet compliance: Exercise: Other complaints:   Potassium back in nml range now she is back on daily potassium 40 mEQ    This visit occurred during the SARS-CoV-2 public health emergency.  Safety protocols were in place, including screening questions prior to the visit, additional usage of staff PPE, and extensive cleaning of exam room while observing appropriate contact time as indicated for disinfecting solutions.   COVID 19 screen:  No recent travel or known  exposure to COVID19 The patient denies respiratory symptoms of COVID 19 at this time. The importance of social distancing was discussed today.     ROS    Past Medical History:  Diagnosis Date  . Anemia   . Arthritis   . 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  Mastectomy/ SLN.  Marland Kitchen Chronic headache   . Complication of anesthesia    prior to 1991 used to have PONV.  none recently.  . Diverticulosis of colon   . Family history of adverse reaction to anesthesia    mother/daighter get sick  . GERD with stricture   . Hard of hearing   . Heart murmur    followed by PCP  . Hiatal hernia   . Hypertension   . Neuropathy    feet  . Osteoporosis   . Pre-diabetes   . Wears dentures    full upper    reports that she has never smoked. She has never used smokeless tobacco. She reports that she does not drink alcohol and does not use drugs.   Current Outpatient Medications:  .  Alcohol Swabs (B-D SINGLE USE SWABS REGULAR) PADS, Use to check blood sugar once daily, Disp: 100 each, Rfl: 3 .  amLODipine (NORVASC) 10 MG tablet, , Disp: , Rfl:  .  amLODipine (NORVASC) 5 MG tablet, Take 1.5 tablets (7.5 mg total) by mouth daily., Disp: 135 tablet, Rfl: 3 .  atorvastatin (LIPITOR) 40 MG tablet, TAKE 1 TABLET AT BEDTIME, Disp: 90 tablet, Rfl:  1 .  Blood Glucose Calibration (ACCU-CHEK AVIVA) SOLN, Use to check blood sugar once daily, Disp: 1 each, Rfl: 0 .  Blood Glucose Calibration (TRUE METRIX LEVEL 1) Low SOLN, Use as directed, Disp: 1 each, Rfl: 3 .  Blood Glucose Monitoring Suppl (TRUE METRIX AIR GLUCOSE METER) w/Device KIT, Use to check blood sugar daily, Disp: 1 kit, Rfl: 0 .  Calcium Carbonate-Vitamin D (CALCIUM 600+D) 600-400 MG-UNIT per tablet, Take 1 tablet by mouth 2 (two) times daily. , Disp: , Rfl:  .  cloNIDine (CATAPRES) 0.1 MG tablet, Take 1 tablet (0.1 mg total) by mouth 2 (two) times daily as needed (Systolic blood pressure greater than 180).,  Disp: 30 tablet, Rfl: 0 .  diclofenac Sodium (VOLTAREN) 1 % GEL, APPLY 2 GRAMS  TOPICALLY TO THE AFFECTED AREA 4 (FOUR) TIMES DAILY., Disp: 300 g, Rfl: 11 .  esomeprazole (NEXIUM) 40 MG capsule, TAKE 1 CAPSULE 2 (TWO) TIMES DAILY BEFORE A MEAL., Disp: 180 capsule, Rfl: 1 .  ferrous sulfate 325 (65 FE) MG tablet, Take 325 mg by mouth daily with breakfast. , Disp: , Rfl:  .  FLUoxetine (PROZAC) 20 MG capsule, TAKE 1 CAPSULE EVERY DAY, Disp: 90 capsule, Rfl: 1 .  fluticasone (FLONASE) 50 MCG/ACT nasal spray, USE 2 SPRAYS INTO THE NOSE DAILY AS DIRECTED (SUBSTITUTED FOR FLONASE), Disp: 48 g, Rfl: 3 .  furosemide (LASIX) 20 MG tablet, TAKE 1 TABLET BY MOUTH EVERY DAY AS NEEDED, Disp: 30 tablet, Rfl: 5 .  gabapentin (NEURONTIN) 100 MG capsule, TAKE 1 CAPSULE IN THE MORNING, 1 CAPSULE AT LUNCH, 1 CAPSULES AT DINNER AND 2 TO 3 CAPSULES AT BEDTIME, Disp: 540 capsule, Rfl: 5 .  Glucosamine 500 MG TABS, Take 500 mg by mouth daily. , Disp: , Rfl:  .  glucose blood (TRUE METRIX BLOOD GLUCOSE TEST) test strip, Use to check blood sugar daily, Disp: 100 each, Rfl: 3 .  isosorbide mononitrate (IMDUR) 30 MG 24 hr tablet, Take 1 tablet (30 mg total) by mouth daily., Disp: 90 tablet, Rfl: 3 .  latanoprost (XALATAN) 0.005 % ophthalmic solution, Place 1 drop into both eyes at bedtime., Disp: , Rfl:  .  lidocaine (LIDODERM) 5 %, APPLY 1 PATCH ONTO THE SKIN EVERY DAY. REMOVE AND DISCARD PATCH WITHIN 12 HOURS OR AS DIRECTED BY PHYSICIAN., Disp: 30 patch, Rfl: 11 .  loratadine (CLARITIN) 10 MG tablet, Take 10 mg by mouth daily., Disp: , Rfl:  .  losartan-hydrochlorothiazide (HYZAAR) 100-25 MG tablet, TAKE 1 TABLET EVERY DAY, Disp: 90 tablet, Rfl: 1 .  Omega-3 Fatty Acids (FISH OIL) 1000 MG CAPS, Take 1,000 mg by mouth daily. , Disp: , Rfl:  .  polycarbophil (FIBERCON) 625 MG tablet, Take 625 mg by mouth daily., Disp: , Rfl:  .  potassium chloride SA (KLOR-CON) 20 MEQ tablet, Take 2 tablets (40 mEq total) by mouth daily.,  Disp: 60 tablet, Rfl: 4 .  tamoxifen (NOLVADEX) 20 MG tablet, Take 1 tablet (20 mg total) by mouth daily., Disp: 90 tablet, Rfl: 3 .  tolterodine (DETROL LA) 4 MG 24 hr capsule, Take 1 capsule (4 mg total) by mouth daily., Disp: 30 capsule, Rfl: 11 .  traMADol (ULTRAM) 50 MG tablet, Take 1 tablet (50 mg total) by mouth every 6 (six) hours as needed. for pain, Disp: 90 tablet, Rfl: 0 .  TRUEplus Lancets 28G MISC, Use to check blood sugar daily, Disp: 100 each, Rfl: 3   Observations/Objective: Blood pressure 120/70, pulse 81, temperature 98.1 F (36.7  C), temperature source Temporal, height 5' (1.524 m), weight 145 lb 4 oz (65.9 kg), SpO2 97 %.  Physical Exam Constitutional:      General: She is not in acute distress.    Appearance: Normal appearance. She is well-developed. She is not ill-appearing or toxic-appearing.  HENT:     Head: Normocephalic.     Right Ear: Hearing, tympanic membrane, ear canal and external ear normal. Tympanic membrane is not erythematous, retracted or bulging.     Left Ear: Hearing, tympanic membrane, ear canal and external ear normal. Tympanic membrane is not erythematous, retracted or bulging.     Nose: No mucosal edema or rhinorrhea.     Right Sinus: No maxillary sinus tenderness or frontal sinus tenderness.     Left Sinus: No maxillary sinus tenderness or frontal sinus tenderness.     Mouth/Throat:     Pharynx: Uvula midline.  Eyes:     General: Lids are normal. Lids are everted, no foreign bodies appreciated.     Conjunctiva/sclera: Conjunctivae normal.     Pupils: Pupils are equal, round, and reactive to light.  Neck:     Thyroid: No thyroid mass or thyromegaly.     Vascular: No carotid bruit.     Trachea: Trachea normal.  Cardiovascular:     Rate and Rhythm: Normal rate and regular rhythm.     Pulses: Normal pulses.     Heart sounds: Normal heart sounds, S1 normal and S2 normal. No murmur heard.  No friction rub. No gallop.   Pulmonary:     Effort:  Pulmonary effort is normal. No tachypnea or respiratory distress.     Breath sounds: Normal breath sounds. No decreased breath sounds, wheezing, rhonchi or rales.  Abdominal:     General: Bowel sounds are normal.     Palpations: Abdomen is soft.     Tenderness: There is no abdominal tenderness.  Musculoskeletal:     Cervical back: Normal range of motion and neck supple.  Skin:    General: Skin is warm and dry.     Findings: No rash.  Neurological:     Mental Status: She is alert.  Psychiatric:        Mood and Affect: Mood is not anxious or depressed.        Speech: Speech normal.        Behavior: Behavior normal. Behavior is cooperative.        Thought Content: Thought content normal.        Judgment: Judgment normal.     Diabetic foot exam:  skin breakdown: small open blister on top of hammer toe in first digit.. healing well, no associated cellulitis. No calluses  Normal DP pulses decreaedsensation to light touch and monofilament Nails abnormal   Assessment and Plan Controlled type 2 diabetes with neuropathy ([place]) Diet controlled.  Diabetic peripheral neuropathy (H[facility]) Stable control on Neurontin.  Essential hypertension Confusion about medicaitons but BP well controlled.  Given added benefit in DM with ACEI/ARB.Marland Kitchen will have her restart 50/12.5 mg daily of losartan HCTZ. Hold amlodipine.  If BP not at goal < 140/90.Marland Kitchen she will call and we will add back amlodipine.   Hyperlipidemia associated with type 2 diabetes mellitus ([facility]) LDL at goal on statin.  Hypokalemia Due to med SE and po intake. Now in nml range after 40 mEq daily x 1 week... reduce dose to 20 mEq daily until follow up.  (HFpEF) heart failure with preserved ejection fraction ([place]) Evolemic in office today. Using  lasix prn. Followed by Dr. END.       Eliezer Lofts, MD

## 2019-07-01 NOTE — Patient Instructions (Addendum)
Restart losatan HCTZ  At 1/2 a tablet of 100/25 mg daily.  Hold amlodipine.  Follow BP at home.. if > 140/90 either call with measurements or go ahead and add back  amlodipine 5 mg daily.  Stay on potassium 20 mEq long term, ONE daily.   Hypokalemia Hypokalemia means that the amount of potassium in the blood is lower than normal. Potassium is a chemical (electrolyte) that helps regulate the amount of fluid in the body. It also stimulates muscle tightening (contraction) and helps nerves work properly. Normally, most of the body's potassium is inside cells, and only a very small amount is in the blood. Because the amount in the blood is so small, minor changes to potassium levels in the blood can be life-threatening. What are the causes? This condition may be caused by:  Antibiotic medicine.  Diarrhea or vomiting. Taking too much of a medicine that helps you have a bowel movement (laxative) can cause diarrhea and lead to hypokalemia.  Chronic kidney disease (CKD).  Medicines that help the body get rid of excess fluid (diuretics).  Eating disorders, such as bulimia.  Low magnesium levels in the body.  Sweating a lot. What are the signs or symptoms? Symptoms of this condition include:  Weakness.  Constipation.  Fatigue.  Muscle cramps.  Mental confusion.  Skipped heartbeats or irregular heartbeat (palpitations).  Tingling or numbness. How is this diagnosed? This condition is diagnosed with a blood test. How is this treated? This condition may be treated by:  Taking potassium supplements by mouth.  Adjusting the medicines that you take.  Eating more foods that contain a lot of potassium. If your potassium level is very low, you may need to get potassium through an IV and be monitored in the hospital. Follow these instructions at home:   Take over-the-counter and prescription medicines only as told by your health care provider. This includes vitamins and  supplements.  Eat a healthy diet. A healthy diet includes fresh fruits and vegetables, whole grains, healthy fats, and lean proteins.  If instructed, eat more foods that contain a lot of potassium. This includes: ? Nuts, such as peanuts and pistachios. ? Seeds, such as sunflower seeds and pumpkin seeds. ? Peas, lentils, and lima beans. ? Whole grain and bran cereals and breads. ? Fresh fruits and vegetables, such as apricots, avocado, bananas, cantaloupe, kiwi, oranges, tomatoes, asparagus, and potatoes. ? Orange juice. ? Tomato juice. ? Red meats. ? Yogurt.  Keep all follow-up visits as told by your health care provider. This is important. Contact a health care provider if you:  Have weakness that gets worse.  Feel your heart pounding or racing.  Vomit.  Have diarrhea.  Have diabetes (diabetes mellitus) and you have trouble keeping your blood sugar (glucose) in your target range. Get help right away if you:  Have chest pain.  Have shortness of breath.  Have vomiting or diarrhea that lasts for more than 2 days.  Faint. Summary  Hypokalemia means that the amount of potassium in the blood is lower than normal.  This condition is diagnosed with a blood test.  Hypokalemia may be treated by taking potassium supplements, adjusting the medicines that you take, or eating more foods that are high in potassium.  If your potassium level is very low, you may need to get potassium through an IV and be monitored in the hospital. This information is not intended to replace advice given to you by your health care provider. Make sure you discuss  any questions you have with your health care provider. Document Revised: 08/20/2017 Document Reviewed: 08/20/2017 Elsevier Patient Education  [place].

## 2019-07-01 NOTE — Assessment & Plan Note (Signed)
Due to med SE and po intake. Now in nml range after 40 mEq daily x 1 week... reduce dose to 20 mEq daily until follow up.

## 2019-07-01 NOTE — Assessment & Plan Note (Signed)
Diet controlled.  

## 2019-07-01 NOTE — Assessment & Plan Note (Signed)
LDL at goal on statin. 

## 2019-07-01 NOTE — Assessment & Plan Note (Signed)
Stable control on Neurontin.

## 2019-07-01 NOTE — Assessment & Plan Note (Signed)
Evolemic in office today. Using lasix prn. Followed by Dr. END.

## 2019-07-02 NOTE — Telephone Encounter (Signed)
PA completed and faxed with office notes to [place] at [phone]

## 2019-07-02 NOTE — Telephone Encounter (Signed)
Received fax from [facility] was missing from form. Facility info was on the form. Dorthula Perfect was faxed back with clinical info again.

## 2019-07-05 NOTE — Telephone Encounter (Signed)
Fax came back as unsuccessful. Refaxed this morning.

## 2019-07-06 ENCOUNTER — Other Ambulatory Visit: Payer: Self-pay

## 2019-07-06 ENCOUNTER — Inpatient Hospital Stay: Payer: Medicare HMO | Attending: Hematology and Oncology

## 2019-07-06 DIAGNOSIS — E538 Deficiency of other specified B group vitamins: Secondary | ICD-10-CM

## 2019-07-06 MED ORDER — CYANOCOBALAMIN 1000 MCG/ML IJ SOLN
1000.0000 ug | Freq: Once | INTRAMUSCULAR | Status: AC
  Administered 2019-07-06: 1000 ug via INTRAMUSCULAR

## 2019-07-12 ENCOUNTER — Ambulatory Visit: Payer: Medicare HMO

## 2019-07-16 NOTE — Telephone Encounter (Signed)
[facility] at [phone] and spoke with Raquel Sarna. No record of fax for PA so PA was started on call. PA is pending review. Tracking ID is [id number] Faxed in clinical notes to [phone]

## 2019-07-19 NOTE — Telephone Encounter (Signed)
Received fax for [facility] reporting confirmation of receipt of the fax that was sent wit Tracking # [id number]. The request is reporting they did not get clinical info. This was all faxed with original fax.    [place] and spoke with Jeralyn Bennett T who helped with portal and found PA that was approved. No further info was needed.  Approved Authorization #[id number] (Tracking [id number]) . Note: criteria met  PA approval printed and sent for scanning. PA must be pulled up under start new PA because it was started on the phone so it was put under the person on the person's name on the phone. Put in the pt's member ID and DOB and it will come up.

## 2019-07-20 ENCOUNTER — Other Ambulatory Visit: Payer: Self-pay

## 2019-07-20 NOTE — Telephone Encounter (Signed)
I am so sorry I did not include that. They approved Monovisc.

## 2019-07-20 NOTE — Telephone Encounter (Signed)
Do we know which medication they will cover??  Monovisc, Synvisc, etc

## 2019-07-20 NOTE — Telephone Encounter (Signed)
Ashley Ryan notified that she has been approved for Monovisc Injection. Appointment scheduled for 07/21/2019 at 10:40 am with Dr. Lorelei Pont.

## 2019-07-21 ENCOUNTER — Ambulatory Visit (INDEPENDENT_AMBULATORY_CARE_PROVIDER_SITE_OTHER): Payer: Medicare HMO | Admitting: Family Medicine

## 2019-07-21 ENCOUNTER — Other Ambulatory Visit: Payer: Self-pay

## 2019-07-21 ENCOUNTER — Ambulatory Visit: Payer: Medicare HMO | Admitting: Family Medicine

## 2019-07-21 ENCOUNTER — Encounter: Payer: Self-pay | Admitting: Family Medicine

## 2019-07-21 VITALS — BP 122/64 | HR 89 | Temp 97.8°F | Ht 60.0 in | Wt 147.2 lb

## 2019-07-21 DIAGNOSIS — M1711 Unilateral primary osteoarthritis, right knee: Secondary | ICD-10-CM | POA: Diagnosis not present

## 2019-07-21 MED ORDER — HYALURONAN 88 MG/4ML IX SOSY
88.0000 mg | PREFILLED_SYRINGE | Freq: Once | INTRA_ARTICULAR | Status: AC
  Administered 2019-07-21: 88 mg via INTRA_ARTICULAR

## 2019-07-21 NOTE — Progress Notes (Signed)
    Destony Prevost T. Melvern Ramone, MD, [place] at [facility] [place], [address]  Phone: [phone]  FAX: [phone]  ARVELLA MASSINGALE - 74 y.o. female  MRN [id number]  Date of Birth: 1945/12/24  Date: 07/21/2019  PCP: [name], MD  Referral: [name], MD  Chief Complaint  Patient presents with  . Knee Pain    Monovisc Injection Right Knee    This visit occurred during the SARS-CoV-2 public health emergency.  Safety protocols were in place, including screening questions prior to the visit, additional usage of staff PPE, and extensive cleaning of exam room while observing appropriate contact time as indicated for disinfecting solutions.   Procedure only: Known R knee OA:    ICD-10-CM   1. Primary osteoarthritis of right knee  M17.11 Hyaluronan SOSY 88 mg   Meds ordered this encounter  Medications  . Hyaluronan SOSY 88 mg   Aspiration/Injection Procedure Note LEODA SMITHHART [birth year] Date of procedure: 07/21/2019  Procedure: Large Joint Aspiration / Injection of Knee for Viscosupplementation, R  Medication: Monovisc Indications: Pain  Procedure Details Patient verbally consented to procedure. Risks (including infection), benefits, and alternatives explained. Sterilely prepped with Chloraprep. Ethyl cholride used for anesthesia, then 7 cc of Lidocaine 1% used for anesthesia in the anterolateral position. Reprepped with Chloraprep.  Anteromedial approach used to inject joint without difficulty, injected with Monovisc, 4 mL. No complications with procedure and tolerated well.   Signed,  [name]. Voncile Schwarz, MD

## 2019-08-04 ENCOUNTER — Ambulatory Visit
Admission: RE | Admit: 2019-08-04 | Discharge: 2019-08-04 | Disposition: A | Discharge status: Home / Self Care | Payer: Medicare HMO | Source: Ambulatory Visit | Attending: Hematology and Oncology | Admitting: Hematology and Oncology

## 2019-08-04 DIAGNOSIS — Z853 Personal history of malignant neoplasm of breast: Secondary | ICD-10-CM | POA: Insufficient documentation

## 2019-08-04 DIAGNOSIS — Z1231 Encounter for screening mammogram for malignant neoplasm of breast: Secondary | ICD-10-CM | POA: Diagnosis not present

## 2019-08-04 DIAGNOSIS — Z780 Asymptomatic menopausal state: Secondary | ICD-10-CM | POA: Insufficient documentation

## 2019-08-04 DIAGNOSIS — Z13820 Encounter for screening for osteoporosis: Secondary | ICD-10-CM | POA: Diagnosis not present

## 2019-08-04 DIAGNOSIS — M8588 Other specified disorders of bone density and structure, other site: Secondary | ICD-10-CM | POA: Insufficient documentation

## 2019-08-04 DIAGNOSIS — M8589 Other specified disorders of bone density and structure, multiple sites: Secondary | ICD-10-CM | POA: Diagnosis not present

## 2019-08-04 DIAGNOSIS — D0512 Intraductal carcinoma in situ of left breast: Secondary | ICD-10-CM

## 2019-08-06 ENCOUNTER — Other Ambulatory Visit: Payer: Self-pay

## 2019-08-06 ENCOUNTER — Ambulatory Visit (INDEPENDENT_AMBULATORY_CARE_PROVIDER_SITE_OTHER): Payer: Medicare HMO | Admitting: Family Medicine

## 2019-08-06 ENCOUNTER — Encounter: Payer: Self-pay | Admitting: Family Medicine

## 2019-08-06 VITALS — BP 130/66 | HR 88 | Temp 98.0°F | Ht 60.0 in | Wt 146.5 lb

## 2019-08-06 DIAGNOSIS — M79675 Pain in left toe(s): Secondary | ICD-10-CM

## 2019-08-06 DIAGNOSIS — E1142 Type 2 diabetes mellitus with diabetic polyneuropathy: Secondary | ICD-10-CM

## 2019-08-06 DIAGNOSIS — L03115 Cellulitis of right lower limb: Secondary | ICD-10-CM | POA: Diagnosis not present

## 2019-08-06 DIAGNOSIS — E1140 Type 2 diabetes mellitus with diabetic neuropathy, unspecified: Secondary | ICD-10-CM

## 2019-08-06 LAB — URIC ACID: Uric Acid, Serum: 4.9 mg/dL (ref 2.4–7.0)

## 2019-08-06 MED ORDER — CEPHALEXIN 500 MG PO CAPS
500.0000 mg | ORAL_CAPSULE | Freq: Three times a day (TID) | ORAL | 0 refills | Status: DC
Start: 2019-08-06 — End: 2019-08-09

## 2019-08-06 NOTE — Progress Notes (Signed)
Chief Complaint  Patient presents with   Swollen Toe    Left Big Toe    History of Present Illness: HPI   74 year old female with diabetes and peripheral neuropathy presents with continued pain and swelling in left great toe, ongoing x 1 month   She has noted enlargement of toe in last month.. now rubbing the top  Of her shoe. Pain off and on.  No pain with moving. Small red area on dorsal foot.  no heat.    No fall, no known injury, no fall.    she scratched right anterior leg in last few days, now in last 24 hours redness, heat surrounding, minimal soreness   no history of gout.  This visit occurred during the SARS-CoV-2 public health emergency.  Safety protocols were in place, including screening questions prior to the visit, additional usage of staff PPE, and extensive cleaning of exam room while observing appropriate contact time as indicated for disinfecting solutions.   COVID 19 screen:  No recent travel or known exposure to COVID19 The patient denies respiratory symptoms of COVID 19 at this time. The importance of social distancing was discussed today.     Review of Systems  Constitutional: Negative for chills and fever.  HENT: Negative for congestion and ear pain.   Eyes: Negative for pain and redness.  Respiratory: Negative for cough and shortness of breath.   Cardiovascular: Negative for chest pain, palpitations and leg swelling.  Gastrointestinal: Negative for abdominal pain, blood in stool, constipation, diarrhea, nausea and vomiting.  Genitourinary: Negative for dysuria.  Musculoskeletal: Negative for falls and myalgias.  Skin: Negative for rash.  Neurological: Negative for dizziness.  Psychiatric/Behavioral: Negative for depression. The patient is not nervous/anxious.       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  Mastectomy/ SLN.   Chronic  headache    Complication of anesthesia    prior to 1991 used to have PONV.  none recently.   Diverticulosis of colon    Family history of adverse reaction to anesthesia    mother/daighter get sick   GERD with stricture    Hard of hearing    Heart murmur    followed by PCP   Hiatal hernia    Hypertension    Neuropathy    feet   Osteoporosis    Pre-diabetes    Wears dentures    full upper    reports that she has never smoked. She has never used smokeless tobacco. She reports that she does not drink alcohol and does not use drugs.   Current Outpatient Medications:    Alcohol Swabs (B-D SINGLE USE SWABS REGULAR) PADS, Use to check blood sugar once daily, Disp: 100 each, Rfl: 3   atorvastatin (LIPITOR) 40 MG tablet, TAKE 1 TABLET AT BEDTIME, Disp: 90 tablet, Rfl: 1   Blood Glucose Calibration (ACCU-CHEK AVIVA) SOLN, Use to check blood sugar once daily, Disp: 1 each, Rfl: 0   Blood Glucose Calibration (TRUE METRIX LEVEL 1) Low SOLN, Use as directed, Disp: 1 each, Rfl: 3   Blood Glucose Monitoring Suppl (TRUE METRIX AIR GLUCOSE METER) w/Device KIT, Use to check blood sugar daily, Disp: 1 kit, Rfl: 0   Calcium Carbonate-Vitamin D (CALCIUM 600+D) 600-400 MG-UNIT per tablet, Take 1 tablet by mouth 2 (two) times daily. , Disp: , Rfl:    cloNIDine (  CATAPRES) 0.1 MG tablet, Take 1 tablet (0.1 mg total) by mouth 2 (two) times daily as needed (Systolic blood pressure greater than 180)., Disp: 30 tablet, Rfl: 0   diclofenac Sodium (VOLTAREN) 1 % GEL, APPLY 2 GRAMS  TOPICALLY TO THE AFFECTED AREA 4 (FOUR) TIMES DAILY., Disp: 300 g, Rfl: 11   esomeprazole (NEXIUM) 40 MG capsule, TAKE 1 CAPSULE 2 (TWO) TIMES DAILY BEFORE A MEAL., Disp: 180 capsule, Rfl: 1   ferrous sulfate 325 (65 FE) MG tablet, Take 325 mg by mouth daily with breakfast. , Disp: , Rfl:    FLUoxetine (PROZAC) 20 MG capsule, TAKE 1 CAPSULE EVERY DAY, Disp: 90 capsule, Rfl: 1   fluticasone (FLONASE) 50 MCG/ACT  nasal spray, USE 2 SPRAYS INTO THE NOSE DAILY AS DIRECTED (SUBSTITUTED FOR FLONASE), Disp: 48 g, Rfl: 3   furosemide (LASIX) 20 MG tablet, TAKE 1 TABLET BY MOUTH EVERY DAY AS NEEDED, Disp: 30 tablet, Rfl: 5   gabapentin (NEURONTIN) 100 MG capsule, TAKE 1 CAPSULE IN THE MORNING, 1 CAPSULE AT LUNCH, 1 CAPSULES AT DINNER AND 2 TO 3 CAPSULES AT BEDTIME, Disp: 540 capsule, Rfl: 5   Glucosamine 500 MG TABS, Take 500 mg by mouth daily. , Disp: , Rfl:    glucose blood (TRUE METRIX BLOOD GLUCOSE TEST) test strip, Use to check blood sugar daily, Disp: 100 each, Rfl: 3   isosorbide mononitrate (IMDUR) 30 MG 24 hr tablet, Take 1 tablet (30 mg total) by mouth daily., Disp: 90 tablet, Rfl: 3   latanoprost (XALATAN) 0.005 % ophthalmic solution, Place 1 drop into both eyes at bedtime., Disp: , Rfl:    lidocaine (LIDODERM) 5 %, APPLY 1 PATCH ONTO THE SKIN EVERY DAY. REMOVE AND DISCARD PATCH WITHIN 12 HOURS OR AS DIRECTED BY PHYSICIAN., Disp: 30 patch, Rfl: 11   loratadine (CLARITIN) 10 MG tablet, Take 10 mg by mouth daily., Disp: , Rfl:    losartan-hydrochlorothiazide (HYZAAR) 100-25 MG tablet, Take 0.5 tablets by mouth daily., Disp: , Rfl:    Omega-3 Fatty Acids (FISH OIL) 1000 MG CAPS, Take 1,000 mg by mouth daily. , Disp: , Rfl:    polycarbophil (FIBERCON) 625 MG tablet, Take 625 mg by mouth daily., Disp: , Rfl:    potassium chloride SA (KLOR-CON) 20 MEQ tablet, Take 1 tablet (20 mEq total) by mouth daily., Disp: 30 tablet, Rfl: 4   tamoxifen (NOLVADEX) 20 MG tablet, Take 1 tablet (20 mg total) by mouth daily., Disp: 90 tablet, Rfl: 3   tolterodine (DETROL LA) 4 MG 24 hr capsule, Take 1 capsule (4 mg total) by mouth daily., Disp: 30 capsule, Rfl: 11   traMADol (ULTRAM) 50 MG tablet, Take 1 tablet (50 mg total) by mouth every 6 (six) hours as needed. for pain, Disp: 90 tablet, Rfl: 0   TRUEplus Lancets 28G MISC, Use to check blood sugar daily, Disp: 100 each, Rfl: 3    Observations/Objective: Blood pressure 130/66, pulse 88, temperature 98 F (36.7 C), temperature source Temporal, height 5' (1.524 m), weight 146 lb 8 oz (66.5 kg), SpO2 94 %.  Physical Exam Constitutional:      General: She is not in acute distress.    Appearance: Normal appearance. She is well-developed. She is not ill-appearing or toxic-appearing.  HENT:     Head: Normocephalic.     Right Ear: Hearing, tympanic membrane, ear canal and external ear normal. Tympanic membrane is not erythematous, retracted or bulging.     Left Ear: Hearing, tympanic membrane, ear canal and  external ear normal. Tympanic membrane is not erythematous, retracted or bulging.     Nose: No mucosal edema or rhinorrhea.     Right Sinus: No maxillary sinus tenderness or frontal sinus tenderness.     Left Sinus: No maxillary sinus tenderness or frontal sinus tenderness.     Mouth/Throat:     Pharynx: Uvula midline.  Eyes:     General: Lids are normal. Lids are everted, no foreign bodies appreciated.     Conjunctiva/sclera: Conjunctivae normal.     Pupils: Pupils are equal, round, and reactive to light.  Neck:     Thyroid: No thyroid mass or thyromegaly.     Vascular: No carotid bruit.     Trachea: Trachea normal.  Cardiovascular:     Rate and Rhythm: Normal rate and regular rhythm.     Pulses: Normal pulses.     Heart sounds: Normal heart sounds, S1 normal and S2 normal. No murmur heard.  No friction rub. No gallop.   Pulmonary:     Effort: Pulmonary effort is normal. No tachypnea or respiratory distress.     Breath sounds: Normal breath sounds. No decreased breath sounds, wheezing, rhonchi or rales.  Abdominal:     General: Bowel sounds are normal.     Palpations: Abdomen is soft.     Tenderness: There is no abdominal tenderness.  Musculoskeletal:     Cervical back: Normal range of motion and neck supple.     Right foot: Decreased range of motion. Deformity present.     Left foot: Decreased range of  motion. Deformity present.  Feet:     Left foot:     Protective Sensation: 2 sites tested. 0 sites sensed.     Skin integrity: Ulcer and skin breakdown present. No blister, erythema or warmth.     Comments:   Skin:    General: Skin is warm and dry.     Findings: No rash.          Comments:  12 cm erythema and warmth noted on right anterior lower leg around central scab  Neurological:     Mental Status: She is alert.  Psychiatric:        Mood and Affect: Mood is not anxious or depressed.        Speech: Speech normal.        Behavior: Behavior normal. Behavior is cooperative.        Thought Content: Thought content normal.        Judgment: Judgment normal.      Assessment and Plan    Great toe pain, left Likely OA with associated deformity and callus, but r/o gout.  Refer to podiatry for  Further eval and treatment given she is a DM with neuropathy.  Can use voltaren gel for pain.   Cellulitis of right anterior lower leg Incidentally noted.   Treat with keflex. Pt given return precautions.    Eliezer Lofts, MD

## 2019-08-06 NOTE — Patient Instructions (Addendum)
Voltaren cream four times daily as need for pain in note.  We will call with referral to foot doctor.  Please stop at the lab to have labs drawn.

## 2019-08-06 NOTE — Assessment & Plan Note (Signed)
Likely OA with associated deformity and callus, but r/o gout.  Refer to podiatry for  Further eval and treatment given she is a DM with neuropathy.  Can use voltaren gel for pain.

## 2019-08-06 NOTE — Assessment & Plan Note (Signed)
Incidentally noted.   Treat with keflex. Pt given return precautions.

## 2019-08-09 ENCOUNTER — Other Ambulatory Visit: Payer: Self-pay

## 2019-08-09 ENCOUNTER — Encounter: Payer: Self-pay | Admitting: Hematology and Oncology

## 2019-08-09 DIAGNOSIS — D0512 Intraductal carcinoma in situ of left breast: Secondary | ICD-10-CM

## 2019-08-09 MED ORDER — DOXYCYCLINE HYCLATE 100 MG PO CAPS
100.0000 mg | ORAL_CAPSULE | Freq: Two times a day (BID) | ORAL | 0 refills | Status: DC
Start: 2019-08-09 — End: 2019-10-27

## 2019-08-09 NOTE — Progress Notes (Signed)
[facility]  [address] [place], [place] [address] Phone: [phone]  Fax: [phone]   Office Visit:  08/10/2019  Referring physician: [name], MD  Chief Complaint: Ashley Ryan is a 74 y.o. female with left breast DCIS s/p mastectomyand B12 deficiency who is seen for 6 month assessment.   HPI: The patient was last seen in the medical oncology clinic on 02/11/2019 via telemedicine. At that time, she noted some right breast tenderness at the bra line.  She denied any bleeding.  She continued tamoxifen.  Colonoscopy on 06/04/2019 by Dr Servando Snare revealed two 6 to 8 mm polyps in the transverse colon (tubular adenomas)and one 6 mm polyp in the rectum (tubular adenoma). Clip (MR conditional) was placed.  There was one 3 mm polyp in the ascending colon (tubular adenoma). There were non-bleeding internal hemorrhoids and diverticulosis in the sigmoid colon.   EGD on 06/04/2019 revealed a medium-sized hiatal hernia, gastritis, and one duodenal polyp (peptic duodenitis).  Pathology revealed iron pill gastritis and no H pylori, metaplasia, dysplasia or malignancy.  She received B12 injections monthly (03/15/2019 - 07/06/2019).  Right screening mammogram on 08/04/2019 revealed no mammographic evidence of malignancy.   Bone density on 08/04/2019 revealed osteopenia with a T score of -2.1 in the right femoral neck.   During the interim, she has been well. She continues to take oral iron. She occasionally take Lasix, but she is not taking extra potassium when she does this.   She was placed on a medication on 08/06/2019 for a recently found insect bite. She said that it didn't resolve on doxycycline.   Past Medical History:  Diagnosis Date  . Anemia   . Arthritis   . Breast cancer (HCC) 06/13/2017   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  Mastectomy/ SLN.  Marland Kitchen Chronic headache   . Complication of anesthesia    prior to 1991 used to have PONV.   none recently.  . Diverticulosis of colon   . Family history of adverse reaction to anesthesia    mother/daighter get sick  . GERD with stricture   . Hard of hearing   . Heart murmur    followed by PCP  . Hiatal hernia   . Hypertension   . Neuropathy    feet  . Osteoporosis   . Pre-diabetes   . Wears dentures    full upper    Past Surgical History:  Procedure Laterality Date  . ABDOMINAL HYSTERECTOMY  1978   one ovary remains  . 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 CARDIAC CATHETERIZATION     "yrs ago" all OK.  Marland Kitchen CATARACT EXTRACTION W/PHACO Left 02/21/2016   Procedure: CATARACT EXTRACTION PHACO AND INTRAOCULAR LENS PLACEMENT (IOC)  Left eye;  Surgeon: Lockie Mola, MD;  Location: [facility];  Service: Ophthalmology;  Laterality: Left;  Left eye Diabetic  . CATARACT EXTRACTION W/PHACO Right 03/13/2016   Procedure: CATARACT EXTRACTION PHACO AND INTRAOCULAR LENS PLACEMENT (IOC)  right  diabetic;  Surgeon: Lockie Mola, MD;  Location: [facility];  Service: Ophthalmology;  Laterality: Right;  diabetic  . CHOLECYSTECTOMY  1978  . COLONOSCOPY W/ POLYPECTOMY  09/09/2013   8 mm tubular adenoma of the cecum.  Melvia Heaps, MD. [facility]  . COLONOSCOPY WITH PROPOFOL N/A 06/04/2019   Procedure: COLONOSCOPY WITH PROPOFOL;  Surgeon: Midge Minium, MD;  Location:  [facility];  Service: Endoscopy;  Laterality: N/A;  . ESOPHAGOGASTRODUODENOSCOPY (EGD) WITH PROPOFOL N/A 06/04/2019   Procedure: ESOPHAGOGASTRODUODENOSCOPY (EGD) WITH PROPOFOL;  Surgeon: Lucilla Lame, MD;  Location: [facility];  Service: Endoscopy;  Laterality: N/A;  . JOINT REPLACEMENT Left    knee   . LEFT HEART CATH AND CORONARY ANGIOGRAPHY N/A 11/06/2017   Procedure: LEFT HEART CATH AND CORONARY ANGIOGRAPHY;  Surgeon: Nelva Bush, MD;  Location: [facility];   Service: Cardiovascular;  Laterality: N/A;  . MASTECTOMY Left 06/13/2017   mastectomy neg margins  . OOPHORECTOMY     one still left  . RENAL ANGIOGRAPHY  11/06/2017   Procedure: RENAL ANGIOGRAPHY;  Surgeon: Nelva Bush, MD;  Location: [facility];  Service: Cardiovascular;;  . SENTINEL NODE BIOPSY Left 06/13/2017   Procedure: SENTINEL NODE BIOPSY;  Surgeon: Robert Bellow, MD;  Location: [facility];  Service: General;  Laterality: Left;  . 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  Surgeon: Robert Bellow, MD;  Location: [facility];  Service: General;  Laterality: Left;    Family History  Problem Relation Age of Onset  . Breast cancer Other   . Stomach cancer Other   . Diabetes Other   . Heart disease Other   . Breast cancer Sister 71  . Esophageal cancer Brother   . Hypertension Mother   . Heart attack Mother 67  . Hypertension Father   . Coronary artery disease Father 36       CABG  . Stroke Father     Social History:  reports that she has never smoked. She has never used smokeless tobacco. She reports that she does not drink alcohol and does not use drugs. Her husband's name is Scientist, research (medical). She is a retired [employer] manager. She lives in [place]. The patient is alone today.   Allergies:  Allergies  Allergen Reactions  . Sulfa Antibiotics Rash    Childhood reaction  . Sulfonamide Derivatives Rash    Childhood reaction.    Current Medications: Current Outpatient Medications  Medication Sig Dispense Refill  . Alcohol Swabs (B-D SINGLE USE SWABS REGULAR) PADS Use to check blood sugar once daily 100 each 3  . atorvastatin (LIPITOR) 40 MG tablet TAKE 1 TABLET AT BEDTIME 90 tablet 1  . Blood Glucose Calibration (ACCU-CHEK AVIVA) SOLN Use to check blood sugar once daily 1 each 0  . Blood Glucose Calibration (TRUE METRIX LEVEL 1) Low SOLN Use as directed 1 each 3  . Blood Glucose Monitoring Suppl (TRUE  METRIX AIR GLUCOSE METER) w/Device KIT Use to check blood sugar daily 1 kit 0  . Calcium Carbonate-Vitamin D (CALCIUM 600+D) 600-400 MG-UNIT per tablet Take 1 tablet by mouth 2 (two) times daily.     . cloNIDine (CATAPRES) 0.1 MG tablet Take 1 tablet (0.1 mg total) by mouth 2 (two) times daily as needed (Systolic blood pressure greater than 180). 30 tablet 0  . diclofenac Sodium (VOLTAREN) 1 % GEL APPLY 2 GRAMS  TOPICALLY TO THE AFFECTED AREA 4 (FOUR) TIMES DAILY. 300 g 11  . doxycycline (VIBRAMYCIN) 100 MG capsule Take 1 capsule (100 mg total) by mouth 2 (two) times daily. 20 capsule 0  . esomeprazole (NEXIUM) 40 MG capsule TAKE 1 CAPSULE 2 (TWO) TIMES DAILY BEFORE A MEAL. 180 capsule 1  . ferrous sulfate 325 (65 FE) MG tablet Take 325 mg by mouth daily with breakfast.     .  FLUoxetine (PROZAC) 20 MG capsule TAKE 1 CAPSULE EVERY DAY 90 capsule 1  . fluticasone (FLONASE) 50 MCG/ACT nasal spray USE 2 SPRAYS INTO THE NOSE DAILY AS DIRECTED (SUBSTITUTED FOR FLONASE) 48 g 3  . furosemide (LASIX) 20 MG tablet TAKE 1 TABLET BY MOUTH EVERY DAY AS NEEDED 30 tablet 5  . gabapentin (NEURONTIN) 100 MG capsule TAKE 1 CAPSULE IN THE MORNING, 1 CAPSULE AT LUNCH, 1 CAPSULES AT DINNER AND 2 TO 3 CAPSULES AT BEDTIME 540 capsule 5  . Glucosamine 500 MG TABS Take 500 mg by mouth daily.     Marland Kitchen glucose blood (TRUE METRIX BLOOD GLUCOSE TEST) test strip Use to check blood sugar daily 100 each 3  . isosorbide mononitrate (IMDUR) 30 MG 24 hr tablet Take 1 tablet (30 mg total) by mouth daily. 90 tablet 3  . latanoprost (XALATAN) 0.005 % ophthalmic solution Place 1 drop into both eyes at bedtime.    . lidocaine (LIDODERM) 5 % APPLY 1 PATCH ONTO THE SKIN EVERY DAY. REMOVE AND DISCARD PATCH WITHIN 12 HOURS OR AS DIRECTED BY PHYSICIAN. 30 patch 11  . loratadine (CLARITIN) 10 MG tablet Take 10 mg by mouth daily.    Marland Kitchen losartan-hydrochlorothiazide (HYZAAR) 100-25 MG tablet Take 0.5 tablets by mouth daily.    . Omega-3 Fatty Acids  (FISH OIL) 1000 MG CAPS Take 1,000 mg by mouth daily.     . polycarbophil (FIBERCON) 625 MG tablet Take 625 mg by mouth daily.    . potassium chloride SA (KLOR-CON) 20 MEQ tablet Take 1 tablet (20 mEq total) by mouth daily. 30 tablet 4  . tamoxifen (NOLVADEX) 20 MG tablet Take 1 tablet (20 mg total) by mouth daily. 90 tablet 3  . tolterodine (DETROL LA) 4 MG 24 hr capsule Take 1 capsule (4 mg total) by mouth daily. 30 capsule 11  . traMADol (ULTRAM) 50 MG tablet Take 1 tablet (50 mg total) by mouth every 6 (six) hours as needed. for pain 90 tablet 0  . TRUEplus Lancets 28G MISC Use to check blood sugar daily 100 each 3   No current facility-administered medications for this visit.    Review of Systems  Constitutional: Positive for weight loss (4 lbs). Negative for chills, diaphoresis, fever and malaise/fatigue.       Feels "pretty good".  HENT: Negative.  Negative for congestion, hearing loss, nosebleeds, sinus pain and sore throat.   Eyes: Negative for blurred vision.  Respiratory: Positive for shortness of breath (exertional). Negative for cough, hemoptysis and sputum production.   Cardiovascular: Positive for chest pain (right breast pain at bra line at times). Negative for palpitations and leg swelling.  Gastrointestinal: Negative for abdominal pain, blood in stool, constipation, diarrhea, heartburn, melena, nausea and vomiting.       Diet is good.  Genitourinary: Negative.  Negative for dysuria, frequency, hematuria and urgency.  Musculoskeletal: Negative for back pain, joint pain (knees), myalgias and neck pain.  Skin: Negative.  Negative for itching and rash.       Insect bite with large 3" diameter red erythematous spot on upper right shin near knee.  Neurological: Negative.  Negative for dizziness, tingling, sensory change, weakness and headaches.  Endo/Heme/Allergies: Negative.  Does not bruise/bleed easily.  Psychiatric/Behavioral: Negative.  Negative for depression and memory  loss. The patient is not nervous/anxious and does not have insomnia.   All other systems reviewed and are negative.  Performance status (ECOG): 1  Physical Exam Vitals and nursing note reviewed.  Constitutional:  General: She is not in acute distress.    Appearance: Normal appearance. She is well-developed. She is not diaphoretic.  HENT:     Head: Normocephalic and atraumatic.     Comments: Curly gray hair.    Mouth/Throat:     Mouth: Mucous membranes are moist.  Eyes:     General: No scleral icterus.    Conjunctiva/sclera: Conjunctivae normal.     Pupils: Pupils are equal, round, and reactive to light.     Comments: Glasses.  Blue eyes.  Cardiovascular:     Rate and Rhythm: Normal rate and regular rhythm.     Pulses: Normal pulses.     Heart sounds: Normal heart sounds.  Pulmonary:     Effort: Pulmonary effort is normal.     Breath sounds: Normal breath sounds.  Chest:     Breasts:        Right: No mass, nipple discharge or skin change.      Comments: Left mastectomy without erythema or nodularity. Abdominal:     Palpations: There is no hepatomegaly, splenomegaly or mass.     Tenderness: There is no abdominal tenderness.  Musculoskeletal:     Right lower leg: No edema.     Left lower leg: No edema.  Lymphadenopathy:     Head:     Right side of head: No preauricular, posterior auricular or occipital adenopathy.     Left side of head: No preauricular, posterior auricular or occipital adenopathy.     Cervical: No cervical adenopathy.     Upper Body:     Right upper body: No supraclavicular or axillary adenopathy.     Left upper body: No supraclavicular or axillary adenopathy.     Lower Body: No right inguinal adenopathy. No left inguinal adenopathy.  Skin:    Findings: Erythema present.     Comments: 10 cm x 8.8 cm erythematous area on the skin overlying the upper right tibia.   Neurological:     Mental Status: She is alert and oriented to person, place, and time.   Psychiatric:        Mood and Affect: Mood and affect normal.        Behavior: Behavior normal.        Thought Content: Thought content normal.        Judgment: Judgment normal.    Appointment on 08/10/2019  Component Date Value Ref Range Status  . Sodium 08/10/2019 135  135 - 145 mmol/L Final  . Potassium 08/10/2019 2.7* 3.5 - 5.1 mmol/L Final   Comment: CRITICAL RESULT CALLED TO, READ BACK BY AND VERIFIED WITH ROXIE ELLINGTON 1125 CVP 08/10/19   . Chloride 08/10/2019 97* 98 - 111 mmol/L Final  . CO2 08/10/2019 25  22 - 32 mmol/L Final  . Glucose, Bld 08/10/2019 100* 70 - 99 mg/dL Final   Glucose reference range applies only to samples taken after fasting for at least 8 hours.  . BUN 08/10/2019 14  8 - 23 mg/dL Final  . Creatinine, Ser 08/10/2019 0.66  0.44 - 1.00 mg/dL Final  . Calcium 89/50/1156 9.0  8.9 - 10.3 mg/dL Final  . Total Protein 08/10/2019 7.0  6.5 - 8.1 g/dL Final  . Albumin 71/64/0890 3.7  3.5 - 5.0 g/dL Final  . AST 97/52/9553 17  15 - 41 U/L Final  . ALT 08/10/2019 15  0 - 44 U/L Final  . Alkaline Phosphatase 08/10/2019 48  38 - 126 U/L Final  . Total Bilirubin 08/10/2019 0.6  0.3 - 1.2 mg/dL Final  . GFR calc non Af Amer 08/10/2019 >60  >60 mL/min Final  . GFR calc Af Amer 08/10/2019 >60  >60 mL/min Final  . Anion gap 08/10/2019 13  5 - 15 Final   Performed at [facility], [address], [place], [place] [address]  . WBC 08/10/2019 6.2  4.0 - 10.5 K/uL Final  . RBC 08/10/2019 4.32  3.87 - 5.11 MIL/uL Final  . Hemoglobin 08/10/2019 11.5* 12.0 - 15.0 g/dL Final  . HCT 90/22/2546 36.4  36 - 46 % Final  . MCV 08/10/2019 84.3  80.0 - 100.0 fL Final  . MCH 08/10/2019 26.6  26.0 - 34.0 pg Final  . MCHC 08/10/2019 31.6  30.0 - 36.0 g/dL Final  . RDW 56/49/9328 17.4* 11.5 - 15.5 % Final  . Platelets 08/10/2019 202  150 - 400 K/uL Final  . nRBC 08/10/2019 0.0  0.0 - 0.2 % Final  . Neutrophils Relative % 08/10/2019 67  % Final  . Neutro Abs 08/10/2019  4.2  1.7 - 7.7 K/uL Final  . Lymphocytes Relative 08/10/2019 21  % Final  . Lymphs Abs 08/10/2019 1.3  0.7 - 4.0 K/uL Final  . Monocytes Relative 08/10/2019 9  % Final  . Monocytes Absolute 08/10/2019 0.6  0 - 1 K/uL Final  . Eosinophils Relative 08/10/2019 1  % Final  . Eosinophils Absolute 08/10/2019 0.1  0 - 0 K/uL Final  . Basophils Relative 08/10/2019 1  % Final  . Basophils Absolute 08/10/2019 0.1  0 - 0 K/uL Final  . Immature Granulocytes 08/10/2019 1  % Final  . Abs Immature Granulocytes 08/10/2019 0.03  0.00 - 0.07 K/uL Final   Performed at [facility], [address], [place], [place] [address]    Assessment:  Ashley Ryan is a 74 y.o. female withleft breast DCISs/p simple mastectomy on 06/13/2017. Pathology revealed at least 8 mm of grade III DCIS. Margins were negative. One sentinel lymph node was negative. DCIS was ER was + (>90%) and PR + (75%). Pathologic stagewas Tis N0.  She began tamoxifen on 07/03/2017.  Diagnostic left mammogramon 05/14/2017 revealed grouped pleomorphic calcifications within the lower outer quadrant of the LEFT breast, spanning 7 mm.  Right screening mammogram on 08/03/2018 revealed no evidence of malignancy.  Right screening mammogram on 08/04/2019 revealed no mammographic evidence of malignancy.   Bone densityon 11/22/2014 revealed osteopeniawith a T-score of -2.4 in the AP spine L1-L4. Bone densityon 06/02/2017 revealed osteopenia with a T-score of -2.1 in the AP spine L1-L4 and -1.9 in the right femoral neck.  Bone density on 08/04/2019 revealed osteopenia with a T score of -2.1 in the right femoral neck.   She a history of iron deficiency anemia. She received a blood transfusion 6 years ago. She took oral ironx 2 years. Labs on 06/02/2017 revealed a ferritin 43 with normal iron studies.    Ferritin has been followed: 5.3 on 08/10/2013, 48.2 on 05/14/2016, 43 on 06/02/2017, 35 on 10/31/2017, 39 on 07/29/2018,  .  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 EGD on 06/04/2019 revealed a medium-sized hiatal hernia, gastritis, and one duodenal polyp (peptic duodenitis).  Pathology revealed iron pill gastritis and no H pylori, metaplasia, dysplasia or malignancy.  Colonoscopyon 09/09/2013 revealed an 8 mm sessile polyp in the cecum (tubular adenoma).  Colonoscopy on 06/04/2019 revealed two 6 to 8 mm polyps in the transverse  colon (tubular adenomas), and one 6 mm polyp in the rectum (tubular adenoma).  Clip (MR conditional) was placed.  There was one 3 mm polyp in the ascending colon (tubular adenoma).  There were non-bleeding internal hemorrhoids and diverticulosis in the sigmoid colon.   She has B12 deficiency.  She receives B12 monthly (last 07/06/2019).  Folate was 8.3 on 08/10/2019.  Symptomatically, she is doing well.  She denies any breast concerns.  Exam is stable except for RLE erythema associated with an insect bite; she is on doxycycline.  Potassium is 2.7.  Plan: 1.   Labs today: CBC with diff, ferritin, CMP, folate. 2.   Left breast DCIS Clinically, she continues to do well.   Exam reveals no evidence of recurrent disease       Right screening mammogram on 08/04/2019 revealed no evidence of disease.         She began tamoxifen on 07/03/2017.  Continue tamoxifen x 5 years. 3.   Osteopenia Review interval bone density study on 06/04/2019.   Bone density is stable.  Continue calcium and vitamin D. 4.   B12 deficiency Patient receives B12 monthly (last 07/06/2019).  B12 today and monthly x6.  Folate 8.3 today.    Check folate annually. 5. Iron deficiency Hematocrit 35.8.  Hemoglobin 11.4.  MCV 82.1 on 02/10/2019.               Hematocrit 36.4.  Hemoglobin 11.5.  MCV 84.3 on 08/10/2019.              Ferritin is 89.    She is on oral iron.  Review interval EGD and  colonoscopy.  EGD revealed gastritis and duodenitis.  She has iron pill gastritis.   Discuss likely plan to discontinue oral iron.  Contact Dr. Allen Norris.  Anticipate IV iron as needed. 6.   Hypokalemia  Potassium 2.7.  Etiology felt secondary to diuretics.  Patient currently taking potassium chloride 20 meq q day.  RN to reach out to Dr Rometta Emery office re: hypokalemia. 7.   RTC in 6 months for MD assessment, labs (CBC with diff, CMP, ferritin, iron studies), and B12.  I discussed the assessment and treatment plan with the patient.  The patient was provided an opportunity to ask questions and all were answered.  The patient agreed with the plan and demonstrated an understanding of the instructions.  The patient was advised to call back if the symptoms worsen or if the condition fails to improve as anticipated.   Lequita Asal, MD, PhD    08/10/2019, 12:01 PM  I, Jacqualyn Posey, am acting as a Education administrator for [employer]. Mike Gip, MD.   I, Presleigh Feldstein C. Mike Gip, MD, have reviewed the above documentation for accuracy and completeness, and I agree with the above.

## 2019-08-09 NOTE — Progress Notes (Signed)
The patient states she has some swollen noted to her right leg and she is currently taking antibiotic. The patient Name and DOB has been verified by phone today

## 2019-08-09 NOTE — Addendum Note (Signed)
Addended by: Carter Kitten on: 08/09/2019 03:37 PM   Modules accepted: Orders

## 2019-08-10 ENCOUNTER — Inpatient Hospital Stay: Payer: Medicare HMO | Attending: Hematology and Oncology

## 2019-08-10 ENCOUNTER — Other Ambulatory Visit: Payer: Self-pay | Admitting: Hematology and Oncology

## 2019-08-10 ENCOUNTER — Telehealth: Payer: Self-pay | Admitting: *Deleted

## 2019-08-10 ENCOUNTER — Inpatient Hospital Stay (HOSPITAL_BASED_OUTPATIENT_CLINIC_OR_DEPARTMENT_OTHER): Payer: Medicare HMO | Admitting: Hematology and Oncology

## 2019-08-10 ENCOUNTER — Telehealth: Payer: Self-pay

## 2019-08-10 ENCOUNTER — Encounter: Payer: Self-pay | Admitting: Hematology and Oncology

## 2019-08-10 ENCOUNTER — Inpatient Hospital Stay: Payer: Medicare HMO

## 2019-08-10 VITALS — BP 143/64 | HR 82 | Temp 98.2°F | Resp 18 | Ht 60.0 in | Wt 142.9 lb

## 2019-08-10 DIAGNOSIS — E876 Hypokalemia: Secondary | ICD-10-CM | POA: Insufficient documentation

## 2019-08-10 DIAGNOSIS — K219 Gastro-esophageal reflux disease without esophagitis: Secondary | ICD-10-CM | POA: Diagnosis not present

## 2019-08-10 DIAGNOSIS — D0512 Intraductal carcinoma in situ of left breast: Secondary | ICD-10-CM | POA: Insufficient documentation

## 2019-08-10 DIAGNOSIS — Z8249 Family history of ischemic heart disease and other diseases of the circulatory system: Secondary | ICD-10-CM | POA: Diagnosis not present

## 2019-08-10 DIAGNOSIS — Z803 Family history of malignant neoplasm of breast: Secondary | ICD-10-CM | POA: Insufficient documentation

## 2019-08-10 DIAGNOSIS — E538 Deficiency of other specified B group vitamins: Secondary | ICD-10-CM

## 2019-08-10 DIAGNOSIS — Z800 Family history of malignant neoplasm of digestive organs: Secondary | ICD-10-CM | POA: Insufficient documentation

## 2019-08-10 DIAGNOSIS — Z79899 Other long term (current) drug therapy: Secondary | ICD-10-CM | POA: Diagnosis not present

## 2019-08-10 DIAGNOSIS — M8580 Other specified disorders of bone density and structure, unspecified site: Secondary | ICD-10-CM | POA: Diagnosis not present

## 2019-08-10 DIAGNOSIS — Z90721 Acquired absence of ovaries, unilateral: Secondary | ICD-10-CM | POA: Diagnosis not present

## 2019-08-10 DIAGNOSIS — Z90710 Acquired absence of both cervix and uterus: Secondary | ICD-10-CM | POA: Diagnosis not present

## 2019-08-10 DIAGNOSIS — M8588 Other specified disorders of bone density and structure, other site: Secondary | ICD-10-CM

## 2019-08-10 DIAGNOSIS — D509 Iron deficiency anemia, unspecified: Secondary | ICD-10-CM

## 2019-08-10 DIAGNOSIS — Z833 Family history of diabetes mellitus: Secondary | ICD-10-CM | POA: Insufficient documentation

## 2019-08-10 DIAGNOSIS — Z9012 Acquired absence of left breast and nipple: Secondary | ICD-10-CM | POA: Diagnosis not present

## 2019-08-10 DIAGNOSIS — I10 Essential (primary) hypertension: Secondary | ICD-10-CM | POA: Diagnosis not present

## 2019-08-10 DIAGNOSIS — E119 Type 2 diabetes mellitus without complications: Secondary | ICD-10-CM | POA: Diagnosis not present

## 2019-08-10 LAB — CBC WITH DIFFERENTIAL/PLATELET
Abs Immature Granulocytes: 0.03 10*3/uL (ref 0.00–0.07)
Basophils Absolute: 0.1 10*3/uL (ref 0.0–0.1)
Basophils Relative: 1 %
Eosinophils Absolute: 0.1 10*3/uL (ref 0.0–0.5)
Eosinophils Relative: 1 %
HCT: 36.4 % (ref 36.0–46.0)
Hemoglobin: 11.5 g/dL — ABNORMAL LOW (ref 12.0–15.0)
Immature Granulocytes: 1 %
Lymphocytes Relative: 21 %
Lymphs Abs: 1.3 10*3/uL (ref 0.7–4.0)
MCH: 26.6 pg (ref 26.0–34.0)
MCHC: 31.6 g/dL (ref 30.0–36.0)
MCV: 84.3 fL (ref 80.0–100.0)
Monocytes Absolute: 0.6 10*3/uL (ref 0.1–1.0)
Monocytes Relative: 9 %
Neutro Abs: 4.2 10*3/uL (ref 1.7–7.7)
Neutrophils Relative %: 67 %
Platelets: 202 10*3/uL (ref 150–400)
RBC: 4.32 MIL/uL (ref 3.87–5.11)
RDW: 17.4 % — ABNORMAL HIGH (ref 11.5–15.5)
WBC: 6.2 10*3/uL (ref 4.0–10.5)
nRBC: 0 % (ref 0.0–0.2)

## 2019-08-10 LAB — COMPREHENSIVE METABOLIC PANEL
ALT: 15 U/L (ref 0–44)
AST: 17 U/L (ref 15–41)
Albumin: 3.7 g/dL (ref 3.5–5.0)
Alkaline Phosphatase: 48 U/L (ref 38–126)
Anion gap: 13 (ref 5–15)
BUN: 14 mg/dL (ref 8–23)
CO2: 25 mmol/L (ref 22–32)
Calcium: 9 mg/dL (ref 8.9–10.3)
Chloride: 97 mmol/L — ABNORMAL LOW (ref 98–111)
Creatinine, Ser: 0.66 mg/dL (ref 0.44–1.00)
GFR calc Af Amer: >60 mL/min (ref >60)
GFR calc non Af Amer: >60 mL/min (ref >60)
Glucose, Bld: 100 mg/dL — ABNORMAL HIGH (ref 70–99)
Potassium: 2.7 mmol/L — CL (ref 3.5–5.1)
Sodium: 135 mmol/L (ref 135–145)
Total Bilirubin: 0.6 mg/dL (ref 0.3–1.2)
Total Protein: 7 g/dL (ref 6.5–8.1)

## 2019-08-10 LAB — IRON AND TIBC
Iron: 29 ug/dL (ref 28–170)
Saturation Ratios: 11 % (ref 10.4–31.8)
TIBC: 269 ug/dL (ref 250–450)
UIBC: 240 ug/dL

## 2019-08-10 LAB — FOLATE: Folate: 8.3 ng/mL (ref >5.9)

## 2019-08-10 LAB — FERRITIN: Ferritin: 89 ng/mL (ref 11–307)

## 2019-08-10 MED ORDER — CYANOCOBALAMIN 1000 MCG/ML IJ SOLN
1000.0000 ug | Freq: Once | INTRAMUSCULAR | Status: AC
  Administered 2019-08-10: 1000 ug via INTRAMUSCULAR

## 2019-08-10 NOTE — Telephone Encounter (Signed)
Routed today's CMP results to patients PCP Diona Browner) per Dr Mike Gip. Patients potassium is 2.7 today

## 2019-08-10 NOTE — Telephone Encounter (Signed)
The patient labs has been routed over to the patient PCP office. The patient has also been informed.

## 2019-08-10 NOTE — Telephone Encounter (Signed)
Ashley Ryan notified as instructed by telephone.  She states she is taking the potassium 20 meq daily.  She states she is not taking the lasix very often. She did take it yesterday because her leg was puffy.

## 2019-08-10 NOTE — Telephone Encounter (Signed)
-----   Message from [name], MD sent at 08/10/2019  1:44 PM EDT ----- Call patient potassium was very low again.  Is she still taking the potassium 20 MEq? How often using lasix? ----- Message ----- From: Drue Dun, RN Sent: 08/10/2019  11:37 AM EDT To: [name], MD  Please see today's potassium level (2.7). Thank you!

## 2019-08-10 NOTE — Telephone Encounter (Signed)
Have her increase to 40 meQ for 1 week then make lab appt to recheck BMET, magnesium

## 2019-08-11 NOTE — Telephone Encounter (Signed)
Ashley Ryan notified as instructed by telephone.  Patient states understanding.  Lab appointment scheduled for 08/18/2019 at 12:45 pm.

## 2019-08-12 ENCOUNTER — Encounter: Payer: Self-pay | Admitting: Family Medicine

## 2019-08-12 ENCOUNTER — Other Ambulatory Visit: Payer: Self-pay

## 2019-08-12 ENCOUNTER — Ambulatory Visit (INDEPENDENT_AMBULATORY_CARE_PROVIDER_SITE_OTHER): Payer: Medicare HMO | Admitting: Family Medicine

## 2019-08-12 ENCOUNTER — Other Ambulatory Visit: Payer: Medicare HMO

## 2019-08-12 VITALS — BP 120/60 | HR 74 | Temp 98.0°F | Ht 60.0 in | Wt 146.0 lb

## 2019-08-12 DIAGNOSIS — L03115 Cellulitis of right lower limb: Secondary | ICD-10-CM

## 2019-08-12 DIAGNOSIS — E876 Hypokalemia: Secondary | ICD-10-CM

## 2019-08-12 DIAGNOSIS — E1140 Type 2 diabetes mellitus with diabetic neuropathy, unspecified: Secondary | ICD-10-CM | POA: Diagnosis not present

## 2019-08-12 MED ORDER — CEFTRIAXONE SODIUM 1 G IJ SOLR
1.0000 g | Freq: Once | INTRAMUSCULAR | Status: AC
  Administered 2019-08-12: 1 g via INTRAMUSCULAR

## 2019-08-12 MED ORDER — TRIAMCINOLONE ACETONIDE 0.5 % EX CREA: 1.0000 "application " | TOPICAL_CREAM | Freq: Two times a day (BID) | CUTANEOUS | 0 refills | Status: AC

## 2019-08-12 NOTE — Progress Notes (Signed)
Chief Complaint  Patient presents with  . Follow-up    Cellulits Right anterior leg    History of Present Illness: HPI   74 year old female with diabetes presents for follow up right lower leg cellulitis.  She was started on cephalexin on 7/16 when redness noted incidentally on exam.  Took for 4 days then...  Since then redness and heat had spread... antibiotics were changed to doxycycline  via phone on 08/10/19 and follow up was scheduled.  Today she reports she is having increased pain with leaning on lower leg.Marland Kitchen area is more firm and still warm.  Right lower leg diffusely swollen.  No fever, no flu like symptoms  Not itchy.. painful    No streaking redness of leg.  Elevating leg some.  On losartan HCTZ... lasix as needed... used 2 days ago.. fluid improved.  Cbc, wbc 6.2 with visit to Dr. Patsy [place].  Did have hyaluronic acid injection on 07/21/2019  He potassium was 2.7.Marland KitchenMarland Kitchen started on 40 mEQ BID .Marland Kitchen plan to continue  1 week then recheck.  This visit occurred during the SARS-CoV-2 public health emergency.  Safety protocols were in place, including screening questions prior to the visit, additional usage of staff PPE, and extensive cleaning of exam room while observing appropriate contact time as indicated for disinfecting solutions.   COVID 19 screen:  No recent travel or known exposure to COVID19 The patient denies respiratory symptoms of COVID 19 at this time. The importance of social distancing was discussed today.     Review of Systems  Constitutional: Negative for chills and fever.  HENT: Negative for congestion and ear pain.   Eyes: Negative for pain and redness.  Respiratory: Negative for cough and shortness of breath.   Cardiovascular: Negative for chest pain, palpitations and leg swelling.  Gastrointestinal: Negative for abdominal pain, blood in stool, constipation, diarrhea, nausea and vomiting.  Genitourinary: Negative for dysuria.  Musculoskeletal: Negative for  falls and myalgias.  Skin: Positive for rash.  Neurological: Negative for dizziness.  Psychiatric/Behavioral: Negative for depression. The patient is not nervous/anxious.       Past Medical History:  Diagnosis Date  . Anemia   . Arthritis   . 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  Mastectomy/ SLN.  Marland Kitchen Chronic headache   . Complication of anesthesia    prior to 1991 used to have PONV.  none recently.  . Diverticulosis of colon   . Family history of adverse reaction to anesthesia    mother/daighter get sick  . GERD with stricture   . Hard of hearing   . Heart murmur    followed by PCP  . Hiatal hernia   . Hypertension   . Neuropathy    feet  . Osteoporosis   . Pre-diabetes   . Wears dentures    full upper    reports that she has never smoked. She has never used smokeless tobacco. She reports that she does not drink alcohol and does not use drugs.   Current Outpatient Medications:  .  Alcohol Swabs (B-D SINGLE USE SWABS REGULAR) PADS, Use to check blood sugar once daily, Disp: 100 each, Rfl: 3 .  atorvastatin (LIPITOR) 40 MG tablet, TAKE 1 TABLET AT BEDTIME, Disp: 90 tablet, Rfl: 1 .  Blood Glucose Calibration (ACCU-CHEK AVIVA) SOLN, Use to check blood sugar once daily, Disp: 1 each, Rfl: 0 .  Blood Glucose Calibration (TRUE METRIX LEVEL 1) Low SOLN, Use as  directed, Disp: 1 each, Rfl: 3 .  Blood Glucose Monitoring Suppl (TRUE METRIX AIR GLUCOSE METER) w/Device KIT, Use to check blood sugar daily, Disp: 1 kit, Rfl: 0 .  Calcium Carbonate-Vitamin D (CALCIUM 600+D) 600-400 MG-UNIT per tablet, Take 1 tablet by mouth 2 (two) times daily. , Disp: , Rfl:  .  cloNIDine (CATAPRES) 0.1 MG tablet, Take 1 tablet (0.1 mg total) by mouth 2 (two) times daily as needed (Systolic blood pressure greater than 180)., Disp: 30 tablet, Rfl: 0 .  diclofenac Sodium (VOLTAREN) 1 % GEL, APPLY 2 GRAMS  TOPICALLY TO THE AFFECTED AREA 4 (FOUR) TIMES DAILY., Disp: 300  g, Rfl: 11 .  doxycycline (VIBRAMYCIN) 100 MG capsule, Take 1 capsule (100 mg total) by mouth 2 (two) times daily., Disp: 20 capsule, Rfl: 0 .  esomeprazole (NEXIUM) 40 MG capsule, TAKE 1 CAPSULE 2 (TWO) TIMES DAILY BEFORE A MEAL., Disp: 180 capsule, Rfl: 1 .  ferrous sulfate 325 (65 FE) MG tablet, Take 325 mg by mouth daily with breakfast. , Disp: , Rfl:  .  FLUoxetine (PROZAC) 20 MG capsule, TAKE 1 CAPSULE EVERY DAY, Disp: 90 capsule, Rfl: 1 .  fluticasone (FLONASE) 50 MCG/ACT nasal spray, USE 2 SPRAYS INTO THE NOSE DAILY AS DIRECTED (SUBSTITUTED FOR FLONASE), Disp: 48 g, Rfl: 3 .  furosemide (LASIX) 20 MG tablet, TAKE 1 TABLET BY MOUTH EVERY DAY AS NEEDED, Disp: 30 tablet, Rfl: 5 .  gabapentin (NEURONTIN) 100 MG capsule, TAKE 1 CAPSULE IN THE MORNING, 1 CAPSULE AT LUNCH, 1 CAPSULES AT DINNER AND 2 TO 3 CAPSULES AT BEDTIME, Disp: 540 capsule, Rfl: 5 .  Glucosamine 500 MG TABS, Take 500 mg by mouth daily. , Disp: , Rfl:  .  glucose blood (TRUE METRIX BLOOD GLUCOSE TEST) test strip, Use to check blood sugar daily, Disp: 100 each, Rfl: 3 .  isosorbide mononitrate (IMDUR) 30 MG 24 hr tablet, Take 1 tablet (30 mg total) by mouth daily., Disp: 90 tablet, Rfl: 3 .  latanoprost (XALATAN) 0.005 % ophthalmic solution, Place 1 drop into both eyes at bedtime., Disp: , Rfl:  .  lidocaine (LIDODERM) 5 %, APPLY 1 PATCH ONTO THE SKIN EVERY DAY. REMOVE AND DISCARD PATCH WITHIN 12 HOURS OR AS DIRECTED BY PHYSICIAN., Disp: 30 patch, Rfl: 11 .  loratadine (CLARITIN) 10 MG tablet, Take 10 mg by mouth daily., Disp: , Rfl:  .  losartan-hydrochlorothiazide (HYZAAR) 100-25 MG tablet, Take 0.5 tablets by mouth daily., Disp: , Rfl:  .  Omega-3 Fatty Acids (FISH OIL) 1000 MG CAPS, Take 1,000 mg by mouth daily. , Disp: , Rfl:  .  polycarbophil (FIBERCON) 625 MG tablet, Take 625 mg by mouth daily., Disp: , Rfl:  .  potassium chloride SA (KLOR-CON) 20 MEQ tablet, Take 1 tablet (20 mEq total) by mouth daily., Disp: 30 tablet,  Rfl: 4 .  tamoxifen (NOLVADEX) 20 MG tablet, Take 1 tablet (20 mg total) by mouth daily., Disp: 90 tablet, Rfl: 3 .  tolterodine (DETROL LA) 4 MG 24 hr capsule, Take 1 capsule (4 mg total) by mouth daily., Disp: 30 capsule, Rfl: 11 .  traMADol (ULTRAM) 50 MG tablet, Take 1 tablet (50 mg total) by mouth every 6 (six) hours as needed. for pain, Disp: 90 tablet, Rfl: 0 .  TRUEplus Lancets 28G MISC, Use to check blood sugar daily, Disp: 100 each, Rfl: 3   Observations/Objective: Blood pressure (!) 120/60, pulse 74, temperature 98 F (36.7 C), temperature source Temporal, height 5' (1.524 m), weight  146 lb (66.2 kg), SpO2 95 %.  Physical Exam Constitutional:      General: She is not in acute distress.    Appearance: Normal appearance. She is well-developed. She is not ill-appearing or toxic-appearing.  HENT:     Head: Normocephalic.     Right Ear: Hearing, tympanic membrane, ear canal and external ear normal. Tympanic membrane is not erythematous, retracted or bulging.     Left Ear: Hearing, tympanic membrane, ear canal and external ear normal. Tympanic membrane is not erythematous, retracted or bulging.     Nose: No mucosal edema or rhinorrhea.     Right Sinus: No maxillary sinus tenderness or frontal sinus tenderness.     Left Sinus: No maxillary sinus tenderness or frontal sinus tenderness.     Mouth/Throat:     Pharynx: Uvula midline.  Eyes:     General: Lids are normal. Lids are everted, no foreign bodies appreciated.     Conjunctiva/sclera: Conjunctivae normal.     Pupils: Pupils are equal, round, and reactive to light.  Neck:     Thyroid: No thyroid mass or thyromegaly.     Vascular: No carotid bruit.     Trachea: Trachea normal.  Cardiovascular:     Rate and Rhythm: Normal rate and regular rhythm.     Pulses: Normal pulses.     Heart sounds: Normal heart sounds, S1 normal and S2 normal. No murmur heard.  No friction rub. No gallop.   Pulmonary:     Effort: Pulmonary effort is  normal. No tachypnea or respiratory distress.     Breath sounds: Normal breath sounds. No decreased breath sounds, wheezing, rhonchi or rales.  Abdominal:     General: Bowel sounds are normal.     Palpations: Abdomen is soft.     Tenderness: There is no abdominal tenderness.  Musculoskeletal:     Cervical back: Normal range of motion and neck supple.     Right lower leg: 1+ Edema present.  Skin:    General: Skin is warm and dry.     Findings: No rash.     Comments: Erythematous, warm  Circular 10 cm diameter lesion on right anterior calf, see image  Neurological:     Mental Status: She is alert.  Psychiatric:        Mood and Affect: Mood is not anxious or depressed.        Speech: Speech normal.        Behavior: Behavior normal. Behavior is cooperative.        Thought Content: Thought content normal.        Judgment: Judgment normal.        Assessment and Plan   Cellulitis of right anterior lower leg Minimal improvement with doxy. Give IM rocephin in office today and re-eval in 24 hours given pt is diabetic. Reassuring as no systemic symptoms.  Elevate leg, warm compress.   Given possible insect bite... will also treat with topical steroid cream.  Hypokalemia Instructed to remain off lasix and to continue Kdur 40 MEq daily x 1 week.. recheck K and Mg next week.  Controlled type 2 diabetes with neuropathy (HCC) Higher risk for infection complications as  Decreased sensation.     Eliezer Lofts, MD

## 2019-08-12 NOTE — Patient Instructions (Addendum)
Change appt for labs to 1 week to check potassium and Mg.  Continue doxycyline today along with the injection. Tommorow take both doxycyline and cephalexin. Elevate leg above heart as often as you as sitting.  Warm compresses on anterior leg twice daily. Apply topical steroid cream trimacnolone cream twice daily

## 2019-08-12 NOTE — Assessment & Plan Note (Signed)
Instructed to remain off lasix and to continue Kdur 40 MEq daily x 1 week.. recheck K and Mg next week.

## 2019-08-12 NOTE — Assessment & Plan Note (Signed)
Minimal improvement with doxy. Give IM rocephin in office today and re-eval in 24 hours given pt is diabetic. Reassuring as no systemic symptoms.  Elevate leg, warm compress.   Given possible insect bite... will also treat with topical steroid cream.

## 2019-08-12 NOTE — Assessment & Plan Note (Signed)
Higher risk for infection complications as  Decreased sensation.

## 2019-08-13 ENCOUNTER — Ambulatory Visit (INDEPENDENT_AMBULATORY_CARE_PROVIDER_SITE_OTHER): Payer: Medicare HMO | Admitting: Family Medicine

## 2019-08-13 VITALS — BP 144/80 | HR 80 | Temp 98.0°F | Wt 144.8 lb

## 2019-08-13 DIAGNOSIS — L03115 Cellulitis of right lower limb: Secondary | ICD-10-CM

## 2019-08-13 MED ORDER — CEFTRIAXONE SODIUM 1 G IJ SOLR
1.0000 g | Freq: Once | INTRAMUSCULAR | Status: AC
  Administered 2019-08-13: 1 g via INTRAMUSCULAR

## 2019-08-13 NOTE — Assessment & Plan Note (Signed)
Seems slightly less swollen and firm after IM rocephin. Definitely no worse.  Repeat rocephin today.  Continue doxy and keflex. Continue elevation and topical steroid.

## 2019-08-13 NOTE — Patient Instructions (Signed)
Apply topical steroid. Elevate Leg as often as possible  Take doxycyline tonight and tommorow take both antibiotics.  Follow up for labs and appt next Friday.. call sooner if  Redness or pain worsening.

## 2019-08-13 NOTE — Progress Notes (Signed)
Chief Complaint  Patient presents with  . Follow-up    1 day for cellulitis    History of Present Illness: HPI    74 year old female with swelling, redness and  Pain in right anterior upper leg.  Currently treating for cellulitis in diabetic vs allergic reaciton to bite.  Seems slightly less swollen and firm after IM rocephin.    still no fever, pain seems to be decreasing some.  no streaking.  no N?V.   This visit occurred during the SARS-CoV-2 public health emergency.  Safety protocols were in place, including screening questions prior to the visit, additional usage of staff PPE, and extensive cleaning of exam room while observing appropriate contact time as indicated for disinfecting solutions.   COVID 19 screen:  No recent travel or known exposure to COVID19 The patient denies respiratory symptoms of COVID 19 at this time. The importance of social distancing was discussed today.     Review of Systems  Constitutional: Negative for chills and fever.  HENT: Negative for congestion and ear pain.   Eyes: Negative for pain and redness.  Respiratory: Negative for cough and shortness of breath.   Cardiovascular: Negative for chest pain, palpitations and leg swelling.  Gastrointestinal: Negative for abdominal pain, blood in stool, constipation, diarrhea, nausea and vomiting.  Genitourinary: Negative for dysuria.  Musculoskeletal: Negative for falls and myalgias.  Skin: Negative for rash.  Neurological: Negative for dizziness.  Psychiatric/Behavioral: Negative for depression. The patient is not nervous/anxious.       Past Medical History:  Diagnosis Date  . Anemia   . Arthritis   . 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  Mastectomy/ SLN.  Marland Kitchen Chronic headache   . Complication of anesthesia    prior to 1991 used to have PONV.  none recently.  . Diverticulosis of colon   . Family history of adverse reaction to anesthesia     mother/daighter get sick  . GERD with stricture   . Hard of hearing   . Heart murmur    followed by PCP  . Hiatal hernia   . Hypertension   . Neuropathy    feet  . Osteoporosis   . Pre-diabetes   . Wears dentures    full upper    reports that she has never smoked. She has never used smokeless tobacco. She reports that she does not drink alcohol and does not use drugs.   Current Outpatient Medications:  .  Alcohol Swabs (B-D SINGLE USE SWABS REGULAR) PADS, Use to check blood sugar once daily, Disp: 100 each, Rfl: 3 .  atorvastatin (LIPITOR) 40 MG tablet, TAKE 1 TABLET AT BEDTIME, Disp: 90 tablet, Rfl: 1 .  Blood Glucose Calibration (ACCU-CHEK AVIVA) SOLN, Use to check blood sugar once daily, Disp: 1 each, Rfl: 0 .  Blood Glucose Calibration (TRUE METRIX LEVEL 1) Low SOLN, Use as directed, Disp: 1 each, Rfl: 3 .  Blood Glucose Monitoring Suppl (TRUE METRIX AIR GLUCOSE METER) w/Device KIT, Use to check blood sugar daily, Disp: 1 kit, Rfl: 0 .  Calcium Carbonate-Vitamin D (CALCIUM 600+D) 600-400 MG-UNIT per tablet, Take 1 tablet by mouth 2 (two) times daily. , Disp: , Rfl:  .  cloNIDine (CATAPRES) 0.1 MG tablet, Take 1 tablet (0.1 mg total) by mouth 2 (two) times daily as needed (Systolic blood pressure greater than 180)., Disp: 30 tablet, Rfl: 0 .  diclofenac Sodium (VOLTAREN) 1 % GEL, APPLY 2 GRAMS  TOPICALLY TO THE AFFECTED AREA 4 (FOUR) TIMES DAILY., Disp: 300 g, Rfl: 11 .  doxycycline (VIBRAMYCIN) 100 MG capsule, Take 1 capsule (100 mg total) by mouth 2 (two) times daily., Disp: 20 capsule, Rfl: 0 .  esomeprazole (NEXIUM) 40 MG capsule, TAKE 1 CAPSULE 2 (TWO) TIMES DAILY BEFORE A MEAL., Disp: 180 capsule, Rfl: 1 .  ferrous sulfate 325 (65 FE) MG tablet, Take 325 mg by mouth daily with breakfast. , Disp: , Rfl:  .  FLUoxetine (PROZAC) 20 MG capsule, TAKE 1 CAPSULE EVERY DAY, Disp: 90 capsule, Rfl: 1 .  fluticasone (FLONASE) 50 MCG/ACT nasal spray, USE 2 SPRAYS INTO THE NOSE DAILY AS  DIRECTED (SUBSTITUTED FOR FLONASE), Disp: 48 g, Rfl: 3 .  furosemide (LASIX) 20 MG tablet, TAKE 1 TABLET BY MOUTH EVERY DAY AS NEEDED, Disp: 30 tablet, Rfl: 5 .  gabapentin (NEURONTIN) 100 MG capsule, TAKE 1 CAPSULE IN THE MORNING, 1 CAPSULE AT LUNCH, 1 CAPSULES AT DINNER AND 2 TO 3 CAPSULES AT BEDTIME, Disp: 540 capsule, Rfl: 5 .  Glucosamine 500 MG TABS, Take 500 mg by mouth daily. , Disp: , Rfl:  .  glucose blood (TRUE METRIX BLOOD GLUCOSE TEST) test strip, Use to check blood sugar daily, Disp: 100 each, Rfl: 3 .  isosorbide mononitrate (IMDUR) 30 MG 24 hr tablet, Take 1 tablet (30 mg total) by mouth daily., Disp: 90 tablet, Rfl: 3 .  latanoprost (XALATAN) 0.005 % ophthalmic solution, Place 1 drop into both eyes at bedtime., Disp: , Rfl:  .  lidocaine (LIDODERM) 5 %, APPLY 1 PATCH ONTO THE SKIN EVERY DAY. REMOVE AND DISCARD PATCH WITHIN 12 HOURS OR AS DIRECTED BY PHYSICIAN., Disp: 30 patch, Rfl: 11 .  loratadine (CLARITIN) 10 MG tablet, Take 10 mg by mouth daily., Disp: , Rfl:  .  losartan-hydrochlorothiazide (HYZAAR) 100-25 MG tablet, Take 0.5 tablets by mouth daily., Disp: , Rfl:  .  Omega-3 Fatty Acids (FISH OIL) 1000 MG CAPS, Take 1,000 mg by mouth daily. , Disp: , Rfl:  .  polycarbophil (FIBERCON) 625 MG tablet, Take 625 mg by mouth daily., Disp: , Rfl:  .  potassium chloride SA (KLOR-CON) 20 MEQ tablet, Take 1 tablet (20 mEq total) by mouth daily., Disp: 30 tablet, Rfl: 4 .  tamoxifen (NOLVADEX) 20 MG tablet, Take 1 tablet (20 mg total) by mouth daily., Disp: 90 tablet, Rfl: 3 .  tolterodine (DETROL LA) 4 MG 24 hr capsule, Take 1 capsule (4 mg total) by mouth daily., Disp: 30 capsule, Rfl: 11 .  traMADol (ULTRAM) 50 MG tablet, Take 1 tablet (50 mg total) by mouth every 6 (six) hours as needed. for pain, Disp: 90 tablet, Rfl: 0 .  triamcinolone cream (KENALOG) 0.5 %, Apply 1 application topically 2 (two) times daily., Disp: 30 g, Rfl: 0 .  TRUEplus Lancets 28G MISC, Use to check blood sugar  daily, Disp: 100 each, Rfl: 3   Observations/Objective: Blood pressure (!) 144/80, pulse 80, temperature 98 F (36.7 C), temperature source Temporal, weight 144 lb 12 oz (65.7 kg), SpO2 95 %.  Physical Exam Constitutional:      General: She is not in acute distress.    Appearance: Normal appearance. She is well-developed. She is not ill-appearing or toxic-appearing.  HENT:     Head: Normocephalic.     Right Ear: Hearing, tympanic membrane, ear canal and external ear normal. Tympanic membrane is not erythematous, retracted or bulging.     Left Ear: Hearing, tympanic membrane, ear canal and  external ear normal. Tympanic membrane is not erythematous, retracted or bulging.     Nose: No mucosal edema or rhinorrhea.     Right Sinus: No maxillary sinus tenderness or frontal sinus tenderness.     Left Sinus: No maxillary sinus tenderness or frontal sinus tenderness.     Mouth/Throat:     Pharynx: Uvula midline.  Eyes:     General: Lids are normal. Lids are everted, no foreign bodies appreciated.     Conjunctiva/sclera: Conjunctivae normal.     Pupils: Pupils are equal, round, and reactive to light.  Neck:     Thyroid: No thyroid mass or thyromegaly.     Vascular: No carotid bruit.     Trachea: Trachea normal.  Cardiovascular:     Rate and Rhythm: Normal rate and regular rhythm.     Pulses: Normal pulses.     Heart sounds: Normal heart sounds, S1 normal and S2 normal. No murmur heard.  No friction rub. No gallop.   Pulmonary:     Effort: Pulmonary effort is normal. No tachypnea or respiratory distress.     Breath sounds: Normal breath sounds. No decreased breath sounds, wheezing, rhonchi or rales.  Abdominal:     General: Bowel sounds are normal.     Palpations: Abdomen is soft.     Tenderness: There is no abdominal tenderness.  Musculoskeletal:     Cervical back: Normal range of motion and neck supple.  Skin:    General: Skin is warm and dry.     Findings: No rash.           Comments: Less firm circular swelling and mild erythema in right upper anterior lower leg.    Neurological:     Mental Status: She is alert.  Psychiatric:        Mood and Affect: Mood is not anxious or depressed.        Speech: Speech normal.        Behavior: Behavior normal. Behavior is cooperative.        Thought Content: Thought content normal.        Judgment: Judgment normal.      Assessment and Plan Cellulitis of right anterior lower leg Seems slightly less swollen and firm after IM rocephin. Definitely no worse.  Repeat rocephin today.  Continue doxy and keflex. Continue elevation and topical steroid.        Eliezer Lofts, MD

## 2019-08-18 ENCOUNTER — Other Ambulatory Visit: Payer: Medicare HMO

## 2019-08-20 ENCOUNTER — Other Ambulatory Visit: Payer: Self-pay | Admitting: Hematology and Oncology

## 2019-08-20 ENCOUNTER — Encounter: Payer: Self-pay | Admitting: Family Medicine

## 2019-08-20 ENCOUNTER — Other Ambulatory Visit: Payer: Self-pay | Admitting: Family Medicine

## 2019-08-20 ENCOUNTER — Ambulatory Visit (INDEPENDENT_AMBULATORY_CARE_PROVIDER_SITE_OTHER): Payer: Medicare HMO | Admitting: Family Medicine

## 2019-08-20 ENCOUNTER — Other Ambulatory Visit: Payer: Self-pay

## 2019-08-20 VITALS — BP 110/60 | HR 92 | Temp 98.0°F | Ht 60.0 in | Wt 146.2 lb

## 2019-08-20 DIAGNOSIS — L03115 Cellulitis of right lower limb: Secondary | ICD-10-CM

## 2019-08-20 DIAGNOSIS — E876 Hypokalemia: Secondary | ICD-10-CM | POA: Diagnosis not present

## 2019-08-20 LAB — BASIC METABOLIC PANEL
BUN: 14 mg/dL (ref 6–23)
CO2: 27 mEq/L (ref 19–32)
Calcium: 8.6 mg/dL (ref 8.4–10.5)
Chloride: 103 mEq/L (ref 96–112)
Creatinine, Ser: 0.73 mg/dL (ref 0.40–1.20)
GFR: 77.46 mL/min (ref >60.00)
Glucose, Bld: 106 mg/dL — ABNORMAL HIGH (ref 70–99)
Potassium: 3.4 mEq/L — ABNORMAL LOW (ref 3.5–5.1)
Sodium: 136 mEq/L (ref 135–145)

## 2019-08-20 LAB — CBC WITH DIFFERENTIAL/PLATELET
Basophils Absolute: 0.1 10*3/uL (ref 0.0–0.1)
Basophils Relative: 1.1 % (ref 0.0–3.0)
Eosinophils Absolute: 0.1 10*3/uL (ref 0.0–0.7)
Eosinophils Relative: 1.1 % (ref 0.0–5.0)
HCT: 31.5 % — ABNORMAL LOW (ref 36.0–46.0)
Hemoglobin: 10.2 g/dL — ABNORMAL LOW (ref 12.0–15.0)
Lymphocytes Relative: 27.1 % (ref 12.0–46.0)
Lymphs Abs: 1.5 10*3/uL (ref 0.7–4.0)
MCHC: 32.2 g/dL (ref 30.0–36.0)
MCV: 84.3 fl (ref 78.0–100.0)
Monocytes Absolute: 0.6 10*3/uL (ref 0.1–1.0)
Monocytes Relative: 11.2 % (ref 3.0–12.0)
Neutro Abs: 3.3 10*3/uL (ref 1.4–7.7)
Neutrophils Relative %: 59.5 % (ref 43.0–77.0)
Platelets: 200 10*3/uL (ref 150.0–400.0)
RBC: 3.74 Mil/uL — ABNORMAL LOW (ref 3.87–5.11)
RDW: 17.9 % — ABNORMAL HIGH (ref 11.5–15.5)
WBC: 5.6 10*3/uL (ref 4.0–10.5)

## 2019-08-20 LAB — MAGNESIUM: Magnesium: 1.6 mg/dL (ref 1.5–2.5)

## 2019-08-20 NOTE — Patient Instructions (Addendum)
Please stop at the lab to have labs drawn.  Keep elevating legs.Marland Kitchen if potassium back in normal range we may consider 1 dose of lasix.  Given right leg swelling more time to resolve.. if not resolved in 2 week.. consider further eval.

## 2019-08-20 NOTE — Progress Notes (Signed)
Chief Complaint  Patient presents with  . Follow-up    Cellulits Right Leg    History of Present Illness: HPI  74 year old female presents  For follow up of swelling vs allergic reaction to bite in right anterior leg.   She has been treated with keflex and then  doxycycline Given IM ceftriaxone  X 2 .  Given topical sterid cream.   Today she reports  No further redness, decreased pain, area is softer, less tense. No fever.  Sem to be resolving.   She has also has low potassium  2.7 on 08/10/19 at Dr. Loleta Dicker She was started on  Kdur 40 mEq daily. Off lasix.  Due for K and Mg check.  This visit occurred during the SARS-CoV-2 public health emergency.  Safety protocols were in place, including screening questions prior to the visit, additional usage of staff PPE, and extensive cleaning of exam room while observing appropriate contact time as indicated for disinfecting solutions.   COVID 19 screen:  No recent travel or known exposure to COVID19 The patient denies respiratory symptoms of COVID 19 at this time. The importance of social distancing was discussed today.     Review of Systems  Constitutional: Negative for chills and fever.  HENT: Negative for congestion and ear pain.   Eyes: Negative for pain and redness.  Respiratory: Negative for cough and shortness of breath.   Cardiovascular: Negative for chest pain, palpitations and leg swelling.  Gastrointestinal: Negative for abdominal pain, blood in stool, constipation, diarrhea, nausea and vomiting.  Genitourinary: Negative for dysuria.  Musculoskeletal: Negative for falls and myalgias.  Skin: Negative for rash.  Neurological: Negative for dizziness.  Psychiatric/Behavioral: Negative for depression. The patient is not nervous/anxious.       Past Medical History:  Diagnosis Date  . Anemia   . Arthritis   . 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  Mastectomy/ SLN.  Marland Kitchen Chronic  headache   . Complication of anesthesia    prior to 1991 used to have PONV.  none recently.  . Diverticulosis of colon   . Family history of adverse reaction to anesthesia    mother/daighter get sick  . GERD with stricture   . Hard of hearing   . Heart murmur    followed by PCP  . Hiatal hernia   . Hypertension   . Neuropathy    feet  . Osteoporosis   . Pre-diabetes   . Wears dentures    full upper    reports that she has never smoked. She has never used smokeless tobacco. She reports that she does not drink alcohol and does not use drugs.   Current Outpatient Medications:  .  Alcohol Swabs (B-D SINGLE USE SWABS REGULAR) PADS, Use to check blood sugar once daily, Disp: 100 each, Rfl: 3 .  atorvastatin (LIPITOR) 40 MG tablet, TAKE 1 TABLET AT BEDTIME, Disp: 90 tablet, Rfl: 1 .  Blood Glucose Calibration (ACCU-CHEK AVIVA) SOLN, Use to check blood sugar once daily, Disp: 1 each, Rfl: 0 .  Blood Glucose Calibration (TRUE METRIX LEVEL 1) Low SOLN, Use as directed, Disp: 1 each, Rfl: 3 .  Blood Glucose Monitoring Suppl (TRUE METRIX AIR GLUCOSE METER) w/Device KIT, Use to check blood sugar daily, Disp: 1 kit, Rfl: 0 .  Calcium Carbonate-Vitamin D (CALCIUM 600+D) 600-400 MG-UNIT per tablet, Take 1 tablet by mouth 2 (two) times daily. , Disp: , Rfl:  .  cloNIDine (  CATAPRES) 0.1 MG tablet, Take 1 tablet (0.1 mg total) by mouth 2 (two) times daily as needed (Systolic blood pressure greater than 180)., Disp: 30 tablet, Rfl: 0 .  diclofenac Sodium (VOLTAREN) 1 % GEL, APPLY 2 GRAMS  TOPICALLY TO THE AFFECTED AREA 4 (FOUR) TIMES DAILY., Disp: 300 g, Rfl: 11 .  doxycycline (VIBRAMYCIN) 100 MG capsule, Take 1 capsule (100 mg total) by mouth 2 (two) times daily., Disp: 20 capsule, Rfl: 0 .  esomeprazole (NEXIUM) 40 MG capsule, TAKE 1 CAPSULE 2 (TWO) TIMES DAILY BEFORE A MEAL., Disp: 180 capsule, Rfl: 1 .  ferrous sulfate 325 (65 FE) MG tablet, Take 325 mg by mouth daily with breakfast. , Disp: , Rfl:   .  FLUoxetine (PROZAC) 20 MG capsule, TAKE 1 CAPSULE EVERY DAY, Disp: 90 capsule, Rfl: 1 .  fluticasone (FLONASE) 50 MCG/ACT nasal spray, USE 2 SPRAYS INTO THE NOSE DAILY AS DIRECTED (SUBSTITUTED FOR FLONASE), Disp: 48 g, Rfl: 3 .  furosemide (LASIX) 20 MG tablet, TAKE 1 TABLET BY MOUTH EVERY DAY AS NEEDED, Disp: 30 tablet, Rfl: 5 .  gabapentin (NEURONTIN) 100 MG capsule, TAKE 1 CAPSULE IN THE MORNING, 1 CAPSULE AT LUNCH, 1 CAPSULES AT DINNER AND 2 TO 3 CAPSULES AT BEDTIME, Disp: 540 capsule, Rfl: 5 .  Glucosamine 500 MG TABS, Take 500 mg by mouth daily. , Disp: , Rfl:  .  glucose blood (TRUE METRIX BLOOD GLUCOSE TEST) test strip, Use to check blood sugar daily, Disp: 100 each, Rfl: 3 .  isosorbide mononitrate (IMDUR) 30 MG 24 hr tablet, Take 1 tablet (30 mg total) by mouth daily., Disp: 90 tablet, Rfl: 3 .  latanoprost (XALATAN) 0.005 % ophthalmic solution, Place 1 drop into both eyes at bedtime., Disp: , Rfl:  .  lidocaine (LIDODERM) 5 %, APPLY 1 PATCH ONTO THE SKIN EVERY DAY. REMOVE AND DISCARD PATCH WITHIN 12 HOURS OR AS DIRECTED BY PHYSICIAN., Disp: 30 patch, Rfl: 11 .  loratadine (CLARITIN) 10 MG tablet, Take 10 mg by mouth daily., Disp: , Rfl:  .  losartan-hydrochlorothiazide (HYZAAR) 100-25 MG tablet, Take 0.5 tablets by mouth daily., Disp: , Rfl:  .  Omega-3 Fatty Acids (FISH OIL) 1000 MG CAPS, Take 1,000 mg by mouth daily. , Disp: , Rfl:  .  polycarbophil (FIBERCON) 625 MG tablet, Take 625 mg by mouth daily., Disp: , Rfl:  .  potassium chloride SA (KLOR-CON) 20 MEQ tablet, Take 1 tablet (20 mEq total) by mouth daily., Disp: 30 tablet, Rfl: 4 .  tolterodine (DETROL LA) 4 MG 24 hr capsule, Take 1 capsule (4 mg total) by mouth daily., Disp: 30 capsule, Rfl: 11 .  traMADol (ULTRAM) 50 MG tablet, Take 1 tablet (50 mg total) by mouth every 6 (six) hours as needed. for pain, Disp: 90 tablet, Rfl: 0 .  triamcinolone cream (KENALOG) 0.5 %, Apply 1 application topically 2 (two) times daily., Disp:  30 g, Rfl: 0 .  TRUEplus Lancets 28G MISC, Use to check blood sugar daily, Disp: 100 each, Rfl: 3 .  tamoxifen (NOLVADEX) 20 MG tablet, TAKE 1 TABLET EVERY DAY, Disp: 90 tablet, Rfl: 3   Observations/Objective: Blood pressure (!) 110/60, pulse 92, temperature 98 F (36.7 C), temperature source Temporal, height 5' (1.524 m), weight 146 lb 4 oz (66.3 kg), SpO2 94 %.  Physical Exam Constitutional:      General: She is not in acute distress.    Appearance: Normal appearance. She is well-developed. She is not ill-appearing or toxic-appearing.  HENT:  Head: Normocephalic.     Right Ear: Hearing, tympanic membrane, ear canal and external ear normal. Tympanic membrane is not erythematous, retracted or bulging.     Left Ear: Hearing, tympanic membrane, ear canal and external ear normal. Tympanic membrane is not erythematous, retracted or bulging.     Nose: No mucosal edema or rhinorrhea.     Right Sinus: No maxillary sinus tenderness or frontal sinus tenderness.     Left Sinus: No maxillary sinus tenderness or frontal sinus tenderness.     Mouth/Throat:     Pharynx: Uvula midline.  Eyes:     General: Lids are normal. Lids are everted, no foreign bodies appreciated.     Conjunctiva/sclera: Conjunctivae normal.     Pupils: Pupils are equal, round, and reactive to light.  Neck:     Thyroid: No thyroid mass or thyromegaly.     Vascular: No carotid bruit.     Trachea: Trachea normal.  Cardiovascular:     Rate and Rhythm: Normal rate and regular rhythm.     Pulses: Normal pulses.     Heart sounds: Normal heart sounds, S1 normal and S2 normal. No murmur heard.  No friction rub. No gallop.   Pulmonary:     Effort: Pulmonary effort is normal. No tachypnea or respiratory distress.     Breath sounds: Normal breath sounds. No decreased breath sounds, wheezing, rhonchi or rales.  Abdominal:     General: Bowel sounds are normal.     Palpations: Abdomen is soft.     Tenderness: There is no  abdominal tenderness.  Musculoskeletal:     Cervical back: Normal range of motion and neck supple.  Skin:    General: Skin is warm and dry.     Findings: No rash.  Neurological:     Mental Status: She is alert.  Psychiatric:        Mood and Affect: Mood is not anxious or depressed.        Speech: Speech normal.        Behavior: Behavior normal. Behavior is cooperative.        Thought Content: Thought content normal.        Judgment: Judgment normal.      Assessment and Plan   Cellulitis of right anterior lower leg Resolving. Elevate leg.  Hypokalemia Due for K and Mg check.     Eliezer Lofts, MD

## 2019-09-06 ENCOUNTER — Other Ambulatory Visit: Payer: Self-pay | Admitting: Internal Medicine

## 2019-09-07 ENCOUNTER — Ambulatory Visit (INDEPENDENT_AMBULATORY_CARE_PROVIDER_SITE_OTHER): Payer: Medicare HMO

## 2019-09-07 ENCOUNTER — Ambulatory Visit: Payer: Medicare HMO | Admitting: Podiatry

## 2019-09-07 ENCOUNTER — Other Ambulatory Visit: Payer: Self-pay

## 2019-09-07 DIAGNOSIS — M7752 Other enthesopathy of left foot: Secondary | ICD-10-CM

## 2019-09-07 DIAGNOSIS — L989 Disorder of the skin and subcutaneous tissue, unspecified: Secondary | ICD-10-CM | POA: Diagnosis not present

## 2019-09-07 NOTE — Progress Notes (Signed)
   HPI: 74 y.o. female presenting today as a new patient for evaluation of a symptomatic lesion to the dorsal aspect of the left great toe.  She states that she has developed a knot over the past 4-6 months to the toe.  She does not recall any incident that would have caused her symptoms.  She states that it is very sensitive and painful with pressure and her husband actually cut a hole out of her shoe so it does not rub.  She presents for further treatment and evaluation  Past Medical History:  Diagnosis Date  . Anemia   . Arthritis   . 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  Mastectomy/ SLN.  Marland Kitchen Chronic headache   . Complication of anesthesia    prior to 1991 used to have PONV.  none recently.  . Diverticulosis of colon   . Family history of adverse reaction to anesthesia    mother/daighter get sick  . GERD with stricture   . Hard of hearing   . Heart murmur    followed by PCP  . Hiatal hernia   . Hypertension   . Neuropathy    feet  . Osteoporosis   . Pre-diabetes   . Wears dentures    full upper     Physical Exam: General: The patient is alert and oriented x3 in no acute distress.  Dermatology: Skin is warm, dry and supple bilateral lower extremities. Negative for open lesions or macerations.  There is a hyperkeratotic callus lesion noted to the medial aspect of the dorsal IPJ of the left hallux.  There is very sensitive to touch  Vascular: Palpable pedal pulses bilaterally. No edema or erythema noted. Capillary refill within normal limits.  Neurological: Epicritic and protective threshold grossly intact bilaterally.   Musculoskeletal Exam: Range of motion within normal limits to all pedal and ankle joints bilateral. Muscle strength 5/5 in all groups bilateral.   Radiographic Exam:  Normal osseous mineralization. Joint spaces preserved. No fracture/dislocation/boney destruction.  There is some spurring noted to the dorsal aspect of the  IPJ of the left hallux based on radiographic exam with overlying callus lesion/corn  Assessment: 1.  Bone spurs IPJ left hallux 2.  Overlying corn IPJ left hallux   Plan of Care:  1. Patient evaluated. X-Rays reviewed.  2.  Excisional debridement of the hyperkeratotic corn was performed using a chisel blade without incident or bleeding. 3.  Recommend OTC corn and callus remover daily 4.  Return to clinic in 6 weeks.  If the patient's symptoms have not improved we may need to proceed with shaving down of the bone spurs      Edrick Kins, DPM [facility]  Dr. Edrick Kins, DPM    [address], [place] [address]                Office [phone]  Fax [phone]

## 2019-09-08 ENCOUNTER — Inpatient Hospital Stay: Payer: Medicare HMO | Attending: Hematology and Oncology

## 2019-09-08 DIAGNOSIS — E538 Deficiency of other specified B group vitamins: Secondary | ICD-10-CM | POA: Diagnosis not present

## 2019-09-08 MED ORDER — CYANOCOBALAMIN 1000 MCG/ML IJ SOLN
1000.0000 ug | Freq: Once | INTRAMUSCULAR | Status: AC
  Administered 2019-09-08: 1000 ug via INTRAMUSCULAR

## 2019-09-08 MED ORDER — CYANOCOBALAMIN 1000 MCG/ML IJ SOLN
INTRAMUSCULAR | Status: AC
  Filled 2019-09-08: qty 1

## 2019-09-24 ENCOUNTER — Other Ambulatory Visit: Payer: Self-pay | Admitting: Family Medicine

## 2019-09-24 NOTE — Telephone Encounter (Signed)
Patient is requesting a refill of Klor-Con. Pt last seen on 08/20/19. Medication was authorized by a historic provider and last filled by Graceann Congress, LPN

## 2019-09-27 NOTE — Assessment & Plan Note (Signed)
Due for K and Mg check.

## 2019-09-27 NOTE — Assessment & Plan Note (Addendum)
Resolving. Elevate leg.

## 2019-10-06 ENCOUNTER — Inpatient Hospital Stay: Payer: Medicare HMO

## 2019-10-12 NOTE — Progress Notes (Signed)
Follow-up Outpatient Visit Date: 10/13/2019  Primary Care Provider: [name], MD [place] [address]  Chief Complaint: Fatigue  HPI:  Ashley Ryan is a 74 y.o. female with history of HFpEF, pulmonary hypertension, systemic hypertension, hiatal hernia, prediabetes, anemia, and arthritis who presents for follow-up of stable angina and HFpEF.  I last saw her in March, at which time she was doing relatively well, though she made note of sporadic pain under the left axilla typically lasting a few seconds and unrelated to exertion.  Chronic exertional dyspnea was stable and did not limit her ADLs.  Given the lack of significant atherosclerotic coronary artery disease involving the major epicardial vessels, as well as preceding fall and easy bruising, aspirin was discontinued.  No other medication changes or additional testing were pursued.  Today, [name] reports she has been feeling relatively well.  She still has intermittent fatigue, stable from prior visits.  She has stable exertional dyspnea.  She denies chest pain, palpitations, and lightheadedness.  She has occasional dependent edema for which she is using her as needed furosemide.  She questions whether or not she needs to remain on standing potassium.  She has not taken this for at least a month.  Her primary limiting factor at this time is pain in the right knee secondary to arthritis.  She has already had a left total knee arthroplasty.  --------------------------------------------------------------------------------------------------  Past Medical History:  Diagnosis Date  . Anemia   . Arthritis   . 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  Mastectomy/ SLN.  Marland Kitchen Chronic headache   . Complication of anesthesia    prior to 1991 used to have PONV.  none recently.  . Diverticulosis of colon   . Family history of adverse reaction to anesthesia    mother/daighter get sick    . GERD with stricture   . Hard of hearing   . Heart murmur    followed by PCP  . Hiatal hernia   . Hypertension   . Neuropathy    feet  . Osteoporosis   . Pre-diabetes   . Wears dentures    full upper   Past Surgical History:  Procedure Laterality Date  . ABDOMINAL HYSTERECTOMY  1978   one ovary remains  . 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 CARDIAC CATHETERIZATION     "yrs ago" all OK.  Marland Kitchen CATARACT EXTRACTION W/PHACO Left 02/21/2016   Procedure: CATARACT EXTRACTION PHACO AND INTRAOCULAR LENS PLACEMENT ([facility])  Left eye;  Surgeon: Leandrew Koyanagi, MD;  Location: [facility];  Service: Ophthalmology;  Laterality: Left;  Left eye Diabetic  . CATARACT EXTRACTION W/PHACO Right 03/13/2016   Procedure: CATARACT EXTRACTION PHACO AND INTRAOCULAR LENS PLACEMENT ([facility])  right  diabetic;  Surgeon: Leandrew Koyanagi, MD;  Location: [facility];  Service: Ophthalmology;  Laterality: Right;  diabetic  . CHOLECYSTECTOMY  1978  . COLONOSCOPY W/ POLYPECTOMY  09/09/2013   8 mm tubular adenoma of the cecum.  Erskine Emery, MD. [facility]  . COLONOSCOPY WITH PROPOFOL N/A 06/04/2019   Procedure: COLONOSCOPY WITH PROPOFOL;  Surgeon: Lucilla Lame, MD;  Location[facility]COPY;  Service: Endoscopy;  Laterality: N/A;  . ESOPHAGOGASTRODUODENOSCOPY (EGD) WITH PROPOFOL N/A 06/04/2019   Procedure: ESOPHAGOGASTRODUODENOSCOPY (EGD) WITH PROPOFOL;  Surgeon: Lucilla Lame, MD;  Location: [facility] ENDOSCOPY;  Service: Endoscopy;  Laterality: N/A;  . JOINT REPLACEMENT Left    knee   . LEFT HEART CATH AND CORONARY ANGIOGRAPHY N/A 11/06/2017   Procedure: LEFT HEART CATH AND CORONARY ANGIOGRAPHY;  Surgeon: Nelva Bush, MD;  Location: [facility] CV LAB;  Service: Cardiovascular;  Laterality: N/A;  . MASTECTOMY Left 06/13/2017   mastectomy neg margins  . OOPHORECTOMY     one still  left  . RENAL ANGIOGRAPHY  11/06/2017   Procedure: RENAL ANGIOGRAPHY;  Surgeon: Nelva Bush, MD;  Location: [facility] CV LAB;  Service: Cardiovascular;;  . SENTINEL NODE BIOPSY Left 06/13/2017   Procedure: SENTINEL NODE BIOPSY;  Surgeon: Robert Bellow, MD;  Location: [facility] ORS;  Service: General;  Laterality: Left;  . 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  Surgeon: Robert Bellow, MD;  Location: [facility] ORS;  Service: General;  Laterality: Left;    Current Meds  Medication Sig  . Alcohol Swabs (B-D SINGLE USE SWABS REGULAR) PADS Use to check blood sugar once daily  . atorvastatin (LIPITOR) 40 MG tablet TAKE 1 TABLET AT BEDTIME  . Blood Glucose Calibration (ACCU-CHEK AVIVA) SOLN Use to check blood sugar once daily  . Blood Glucose Calibration (TRUE METRIX LEVEL 1) Low SOLN Use as directed  . Blood Glucose Monitoring Suppl (TRUE METRIX AIR GLUCOSE METER) w/Device KIT Use to check blood sugar daily  . Calcium Carbonate-Vitamin D (CALCIUM 600+D) 600-400 MG-UNIT per tablet Take 1 tablet by mouth 2 (two) times daily.   . cloNIDine (CATAPRES) 0.1 MG tablet Take 1 tablet (0.1 mg total) by mouth 2 (two) times daily as needed (Systolic blood pressure greater than 180).  . diclofenac Sodium (VOLTAREN) 1 % GEL APPLY 2 GRAMS  TOPICALLY TO THE AFFECTED AREA 4 (FOUR) TIMES DAILY.  Marland Kitchen esomeprazole (NEXIUM) 40 MG capsule TAKE 1 CAPSULE 2 (TWO) TIMES DAILY BEFORE A MEAL.  . ferrous sulfate 325 (65 FE) MG tablet Take 325 mg by mouth daily with breakfast.   . FLUoxetine (PROZAC) 20 MG capsule TAKE 1 CAPSULE EVERY DAY  . fluticasone (FLONASE) 50 MCG/ACT nasal spray USE 2 SPRAYS INTO THE NOSE DAILY AS DIRECTED (SUBSTITUTED FOR FLONASE)  . furosemide (LASIX) 20 MG tablet TAKE 1 TABLET BY MOUTH EVERY DAY AS NEEDED  . gabapentin (NEURONTIN) 100 MG capsule TAKE 1 CAPSULE IN THE MORNING, 1 CAPSULE AT LUNCH, 1 CAPSULES AT DINNER AND  2 TO 3 CAPSULES AT BEDTIME  . Glucosamine 500 MG TABS Take 500 mg by mouth daily.   Marland Kitchen glucose blood (TRUE METRIX BLOOD GLUCOSE TEST) test strip Use to check blood sugar daily  . isosorbide mononitrate (IMDUR) 30 MG 24 hr tablet Take 1 tablet (30 mg total) by mouth daily.  Marland Kitchen latanoprost (XALATAN) 0.005 % ophthalmic solution Place 1 drop into both eyes at bedtime.  . lidocaine (LIDODERM) 5 % Place 1 patch onto the skin as needed. Remove & Discard patch within 12 hours or as directed by MD  . loratadine (CLARITIN) 10 MG tablet Take 10 mg by mouth daily.  Marland Kitchen losartan-hydrochlorothiazide (HYZAAR) 100-25 MG tablet Take 0.5 tablets by mouth daily.  . Omega-3 Fatty Acids (FISH OIL) 1000 MG CAPS Take 1,000 mg by mouth daily.   . polycarbophil (FIBERCON) 625 MG tablet Take 625 mg by mouth daily.  . tamoxifen (NOLVADEX) 20 MG tablet TAKE 1 TABLET EVERY DAY  . tolterodine (DETROL LA) 4 MG 24 hr capsule Take 1 capsule (4 mg  total) by mouth daily.  . traMADol (ULTRAM) 50 MG tablet Take 1 tablet (50 mg total) by mouth every 6 (six) hours as needed. for pain  . TRUEplus Lancets 28G MISC Use to check blood sugar daily    Allergies: Sulfa antibiotics and Sulfonamide derivatives  Social History   Tobacco Use  . Smoking status: Never Smoker  . Smokeless tobacco: Never Used  Vaping Use  . Vaping Use: Never used  Substance Use Topics  . Alcohol use: No  . Drug use: No    Family History  Problem Relation Age of Onset  . Breast cancer Other   . Stomach cancer Other   . Diabetes Other   . Heart disease Other   . Breast cancer Sister 51  . Esophageal cancer Brother   . Hypertension Mother   . Heart attack Mother 96  . Hypertension Father   . Coronary artery disease Father 54       CABG  . Stroke Father     Review of Systems: A 12-system review of systems was performed and was negative except as noted in the  HPI.  --------------------------------------------------------------------------------------------------  Physical Exam: BP 110/78 (BP Location: Right Arm, Patient Position: Sitting, Cuff Size: Normal)   Pulse 71   Ht 5' (1.524 m)   Wt 144 lb (65.3 kg)   SpO2 96%   BMI 28.12 kg/m   General: NAD. Neck: No JVD or HJR. Lungs: Clear to auscultation without wheezes or crackles. Heart: Regular rate and rhythm without murmurs, rubs, or gallops. Abdomen: Soft, nontender, nondistended. Extremities: No lower extremity edema.  EKG: Normal sinus rhythm with right axis deviation, low voltage, and nonspecific T wave changes.  Low voltage is new in the limb leads.  Otherwise, no significant change from prior tracing on 04/01/2019.  Lab Results  Component Value Date   WBC 5.6 08/20/2019   HGB 10.2 (L) 08/20/2019   HCT 31.5 (L) 08/20/2019   MCV 84.3 08/20/2019   PLT 200.0 08/20/2019    Lab Results  Component Value Date   NA 136 08/20/2019   K 3.4 (L) 08/20/2019   CL 103 08/20/2019   CO2 27 08/20/2019   BUN 14 08/20/2019   CREATININE 0.73 08/20/2019   GLUCOSE 106 (H) 08/20/2019   ALT 15 08/10/2019    Lab Results  Component Value Date   CHOL 128 06/24/2019   HDL 50.40 06/24/2019   LDLCALC 55 06/24/2019   LDLDIRECT 102.5 07/21/2009   TRIG 113.0 06/24/2019   CHOLHDL 3 06/24/2019    --------------------------------------------------------------------------------------------------  ASSESSMENT AND PLAN: Chronic HFpEF: Overall, Ms. Mclear reports feeling about the same as at our last visit.  Her mobility is primarily limited by knee pain, though she has some exertional dyspnea when walking modest distances, suggestive of NYHA class II-III heart failure.  She appears euvolemic on examination today.  We will continue her current medication regimen, including as needed furosemide.  Given concerns about hypokalemia in the past and standing potassium supplementation (patient has not been  taking for several weeks), I will check a basic metabolic panel today.  Stable angina: Ms. Kuck denies further chest pain, with prior catheterization in 2019 demonstrating no significant epicardial coronary artery disease.  There was concern for microvascular dysfunction.  We will continue isosorbide mononitrate 30 mg daily for antianginal therapy.  Hypertension: Blood pressure well controlled today.  No medication changes at this time.  Hyperlipidemia: Continue atorvastatin.  Hypokalemia: Significant hypokalemia noted earlier this summer for which standing potassium was  prescribed.  Ms. Nahm has not been taking this for at least a month.  I will recheck a BMP today.  Follow-up: Return to clinic in 6 months.  Nelva Bush, MD 10/13/2019 8:20 AM

## 2019-10-13 ENCOUNTER — Ambulatory Visit (INDEPENDENT_AMBULATORY_CARE_PROVIDER_SITE_OTHER): Payer: Medicare HMO | Admitting: Internal Medicine

## 2019-10-13 ENCOUNTER — Other Ambulatory Visit: Payer: Self-pay

## 2019-10-13 ENCOUNTER — Encounter: Payer: Self-pay | Admitting: Internal Medicine

## 2019-10-13 VITALS — BP 110/78 | HR 71 | Ht 60.0 in | Wt 144.0 lb

## 2019-10-13 DIAGNOSIS — E876 Hypokalemia: Secondary | ICD-10-CM

## 2019-10-13 DIAGNOSIS — I10 Essential (primary) hypertension: Secondary | ICD-10-CM | POA: Diagnosis not present

## 2019-10-13 DIAGNOSIS — I208 Other forms of angina pectoris: Secondary | ICD-10-CM | POA: Diagnosis not present

## 2019-10-13 DIAGNOSIS — I5032 Chronic diastolic (congestive) heart failure: Secondary | ICD-10-CM

## 2019-10-13 DIAGNOSIS — E785 Hyperlipidemia, unspecified: Secondary | ICD-10-CM | POA: Diagnosis not present

## 2019-10-13 NOTE — Patient Instructions (Signed)
Medication Instructions:  Your physician recommends that you continue on your current medications as directed. Please refer to the Current Medication list given to you today.  Clonidine has been removed from your list.   *If you need a refill on your cardiac medications before your next appointment, please call your pharmacy*   Lab Work: Your physician recommends that you return for lab work in: [place], [place].  If you have labs (blood work) drawn today and your tests are completely normal, you will receive your results only by: Marland Kitchen MyChart Message (if you have MyChart) OR . A paper copy in the mail If you have any lab test that is abnormal or we need to change your treatment, we will call you to review the results.  Testing/Procedures: none  Follow-Up: At [facility], you and your health needs are our priority.  As part of our continuing mission to provide you with exceptional heart care, we have created designated Provider Care Teams.  These Care Teams include your primary Cardiologist (physician) and Advanced Practice Providers (APPs -  Physician Assistants and Nurse Practitioners) who all work together to provide you with the care you need, when you need it.  We recommend signing up for the patient portal called "MyChart".  Sign up information is provided on this After Visit Summary.  MyChart is used to connect with patients for Virtual Visits (Telemedicine).  Patients are able to view lab/test results, encounter notes, upcoming appointments, etc.  Non-urgent messages can be sent to your provider as well.   To learn more about what you can do with MyChart, go to [email or web address].    Your next appointment:   6 month(s)  The format for your next appointment:   In Person  Provider:   You may see Nelva Bush, MD or one of the following Advanced Practice Providers on your designated Care Team:    Murray Hodgkins, NP  Christell Faith, PA-C  Marrianne Mood,  PA-C  Cadence [place], [place]

## 2019-10-14 LAB — BASIC METABOLIC PANEL
BUN/Creatinine Ratio: 21 (ref 12–28)
BUN: 13 mg/dL (ref 8–27)
CO2: 25 mmol/L (ref 20–29)
Calcium: 9 mg/dL (ref 8.7–10.3)
Chloride: 105 mmol/L (ref 96–106)
Creatinine, Ser: 0.62 mg/dL (ref 0.57–1.00)
GFR calc Af Amer: 101 mL/min/{1.73_m2} (ref >59)
GFR calc non Af Amer: 88 mL/min/{1.73_m2} (ref >59)
Glucose: 86 mg/dL (ref 65–99)
Potassium: 3.6 mmol/L (ref 3.5–5.2)
Sodium: 141 mmol/L (ref 134–144)

## 2019-10-14 LAB — MAGNESIUM: Magnesium: 1.9 mg/dL (ref 1.6–2.3)

## 2019-10-19 ENCOUNTER — Ambulatory Visit: Payer: Medicare HMO | Admitting: Podiatry

## 2019-10-20 ENCOUNTER — Inpatient Hospital Stay: Payer: Medicare HMO | Attending: Hematology and Oncology

## 2019-10-20 ENCOUNTER — Other Ambulatory Visit: Payer: Self-pay

## 2019-10-20 DIAGNOSIS — E538 Deficiency of other specified B group vitamins: Secondary | ICD-10-CM

## 2019-10-20 MED ORDER — CYANOCOBALAMIN 1000 MCG/ML IJ SOLN
1000.0000 ug | Freq: Once | INTRAMUSCULAR | Status: AC
  Administered 2019-10-20: 1000 ug via INTRAMUSCULAR
  Filled 2019-10-20: qty 1

## 2019-10-22 ENCOUNTER — Encounter: Payer: Self-pay | Admitting: Podiatry

## 2019-10-22 ENCOUNTER — Ambulatory Visit: Payer: Medicare HMO | Admitting: Podiatry

## 2019-10-22 ENCOUNTER — Other Ambulatory Visit: Payer: Self-pay

## 2019-10-22 DIAGNOSIS — M7752 Other enthesopathy of left foot: Secondary | ICD-10-CM

## 2019-10-22 DIAGNOSIS — M2032 Hallux varus (acquired), left foot: Secondary | ICD-10-CM | POA: Diagnosis not present

## 2019-10-22 NOTE — Patient Instructions (Signed)
Pre-Operative Instructions  Congratulations, you have decided to take an important step towards improving your quality of life.  You can be assured that the doctors and staff at [facility] will be with you every step of the way.  Here are some important things you should know:  1. Plan to be at the surgery center/hospital at least 1 (one) hour prior to your scheduled time, unless otherwise directed by the surgical center/hospital staff.  You must have a responsible adult accompany you, remain during the surgery and drive you home.  Make sure you have directions to the surgical center/hospital to ensure you arrive on time. 2. If you are having surgery at [facility] or [facility], you will need a copy of your medical history and physical form from your family physician within one month prior to the date of surgery. We will give you a form for your primary physician to complete.  3. We make every effort to accommodate the date you request for surgery.  However, there are times where surgery dates or times have to be moved.  We will contact you as soon as possible if a change in schedule is required.   4. No aspirin/ibuprofen for one week before surgery.  If you are on aspirin, any non-steroidal anti-inflammatory medications (Mobic, Aleve, Ibuprofen) should not be taken seven (7) days prior to your surgery.  You make take Tylenol for pain prior to surgery.  5. Medications - If you are taking daily heart and blood pressure medications, seizure, reflux, allergy, asthma, anxiety, pain or diabetes medications, make sure you notify the surgery center/hospital before the day of surgery so they can tell you which medications you should take or avoid the day of surgery. 6. No food or drink after midnight the night before surgery unless directed otherwise by surgical center/hospital staff. 7. No alcoholic beverages 24-hours prior to surgery.  No smoking 24-hours prior or 24-hours after  surgery. 8. Wear loose pants or shorts. They should be loose enough to fit over bandages, boots, and casts. 9. Don't wear slip-on shoes. Sneakers are preferred. 10. Bring your boot with you to the surgery center/hospital.  Also bring crutches or a walker if your physician has prescribed it for you.  If you do not have this equipment, it will be provided for you after surgery. 11. If you have not been contacted by the surgery center/hospital by the day before your surgery, call to confirm the date and time of your surgery. 12. Leave-time from work may vary depending on the type of surgery you have.  Appropriate arrangements should be made prior to surgery with your employer. 13. Prescriptions will be provided immediately following surgery by your doctor.  Fill these as soon as possible after surgery and take the medication as directed. Pain medications will not be refilled on weekends and must be approved by the doctor. 14. Remove nail polish on the operative foot and avoid getting pedicures prior to surgery. 15. Wash the night before surgery.  The night before surgery wash the foot and leg well with water and the antibacterial soap provided. Be sure to pay special attention to beneath the toenails and in between the toes.  Wash for at least three (3) minutes. Rinse thoroughly with water and dry well with a towel.  Perform this wash unless told not to do so by your physician.  Enclosed: 1 Ice pack (please put in freezer the night before surgery)   1 Hibiclens skin cleaner     Pre-op instructions  If you have any questions regarding the instructions, please do not hesitate to call our office.  [place]: [address], [place], [place] [address] -- [phone]  [place]: [address] [place], [place] [address] -- [phone]  [place]: [address], [place], [place] [address] -- [phone]   Website: [email or web address] 

## 2019-10-26 NOTE — Progress Notes (Signed)
   HPI: 74 y.o. female presenting today for follow-up treatment and evaluation regarding pain and tenderness to the left great toe.  Patient has a contracted IPJ of the left hallux that is causing rubbing and irritation in her shoe gear.  At one point she had developed a callus and so she was treated conservatively last visit.  She presents for further treatment and evaluation  Past Medical History:  Diagnosis Date  . Anemia   . Arthritis   . 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  Mastectomy/ SLN.  Marland Kitchen Chronic headache   . Complication of anesthesia    prior to 1991 used to have PONV.  none recently.  . Diverticulosis of colon   . Family history of adverse reaction to anesthesia    mother/daighter get sick  . GERD with stricture   . Hard of hearing   . Heart murmur    followed by PCP  . Hiatal hernia   . Hypertension   . Neuropathy    feet  . Osteoporosis   . Pre-diabetes   . Wears dentures    full upper     Physical Exam: General: The patient is alert and oriented x3 in no acute distress.  Dermatology: Skin is warm, dry and supple bilateral lower extremities. Negative for open lesions or macerations.  There is a hyperkeratotic callus lesion noted to the medial aspect of the dorsal IPJ of the left hallux.  There is very sensitive to touch  Vascular: Palpable pedal pulses bilaterally. No edema or erythema noted. Capillary refill within normal limits.  Neurological: Epicritic and protective threshold grossly intact bilaterally.   Musculoskeletal Exam: Range of motion within normal limits to all pedal and ankle joints bilateral. Muscle strength 5/5 in all groups bilateral.  There is pain and tenderness to the IPJ of the left hallux.  There is also contracture at the IPJ consistent with findings of hallux malleus   Assessment: 1.  Bone spurs IPJ left hallux 2.  Overlying corn IPJ left hallux   Plan of Care:  1. Patient evaluated. X-Rays  reviewed again today that were taken at the last visit.  2. Today we discussed the conservative versus surgical management of the presenting pathology. The patient opts for surgical management. All possible complications and details of the procedure were explained. All patient questions were answered. No guarantees were expressed or implied. 3. Authorization for surgery was initiated today. Surgery will consist of hallux IPJ arthrodesis left 4.  Return to clinic 1 week postop        Edrick Kins, DPM [facility]  Dr. Edrick Kins, DPM    [address], [place] [address]                Office [phone]  Fax [phone]

## 2019-10-26 NOTE — Progress Notes (Signed)
    Johnothan Bascomb T. Cleo Villamizar, MD, [place] [facility] [place], [address]  Phone: [phone]  FAX: [phone]  JELESA MANGINI - 74 y.o. female  MRN [id number]  Date of Birth: 16-Jan-1946  Date: 10/27/2019  PCP: [name], MD  Referral: Lequita Asal, MD  Chief Complaint  Patient presents with  . Knee Pain    Right    This visit occurred during the SARS-CoV-2 public health emergency.  Safety protocols were in place, including screening questions prior to the visit, additional usage of staff PPE, and extensive cleaning of exam room while observing appropriate contact time as indicated for disinfecting solutions.    She is a well-known patient with end-stage OA of the R knee.  I last did a monovisc injection 3 months ago for her.  Aspiration/Injection Procedure Note [name] 1945-06-23 Date of procedure: 10/27/2019  Procedure: Large Joint Aspiration / Injection of Knee, R Indications: Pain  Procedure Details Patient verbally consented to procedure. Risks (including potential rare risk of infection), benefits, and alternatives explained. Sterilely prepped with Chloraprep. Ethyl cholride used for anesthesia. 8 cc Lidocaine 1% mixed with 2 mL Depo-Medrol 40 mg injected using the anteromedial approach without difficulty. No complications with procedure and tolerated well. Patient had decreased pain post-injection. Medication: 2 mL of Depo-Medrol 40 mg, equaling Depo-Medrol 80 mg total   Signed,  Janiaya Ryser T. Tekeisha Hakim, MD

## 2019-10-27 ENCOUNTER — Other Ambulatory Visit: Payer: Self-pay

## 2019-10-27 ENCOUNTER — Ambulatory Visit (INDEPENDENT_AMBULATORY_CARE_PROVIDER_SITE_OTHER): Payer: Medicare HMO | Admitting: Family Medicine

## 2019-10-27 ENCOUNTER — Encounter: Payer: Self-pay | Admitting: Family Medicine

## 2019-10-27 VITALS — BP 120/64 | HR 80 | Temp 97.5°F | Ht 60.0 in | Wt 144.5 lb

## 2019-10-27 DIAGNOSIS — Z23 Encounter for immunization: Secondary | ICD-10-CM | POA: Diagnosis not present

## 2019-10-27 DIAGNOSIS — M1711 Unilateral primary osteoarthritis, right knee: Secondary | ICD-10-CM

## 2019-10-27 MED ORDER — METHYLPREDNISOLONE ACETATE 40 MG/ML IJ SUSP
80.0000 mg | Freq: Once | INTRAMUSCULAR | Status: AC
  Administered 2019-10-27: 80 mg via INTRA_ARTICULAR

## 2019-10-27 NOTE — Addendum Note (Signed)
Addended by: Carter Kitten on: 10/27/2019 08:51 AM   Modules accepted: Orders

## 2019-11-03 ENCOUNTER — Inpatient Hospital Stay: Payer: Medicare HMO

## 2019-11-12 ENCOUNTER — Telehealth: Payer: Self-pay

## 2019-11-12 ENCOUNTER — Other Ambulatory Visit: Payer: Self-pay | Admitting: Family Medicine

## 2019-11-12 NOTE — Telephone Encounter (Signed)
DOS 12/02/2019  HALLUX IPJ FUSION LT - 60677  PER [employer] NO PRECERT REQUIRED FOR CPT 28760  The following codes do not require a pre-authorization Created on 11/12/2019 Sex F DOB 10-18-1945 Payer [employer] Membership type Medicare Plan Type MED Plan Year 01/22/2019 - 01/20/9998 Group ID [id number] Service info [id number] Fusion of great toe

## 2019-11-15 ENCOUNTER — Other Ambulatory Visit: Payer: Self-pay | Admitting: *Deleted

## 2019-11-15 MED ORDER — TRAMADOL HCL 50 MG PO TABS
50.0000 mg | ORAL_TABLET | Freq: Four times a day (QID) | ORAL | 0 refills | Status: DC | PRN
Start: 2019-11-15 — End: 2020-02-22

## 2019-11-15 NOTE — Telephone Encounter (Signed)
Last office visit 10/27/2019 with Dr. Lorelei Pont for osteoarthritis of right knee.  Last refilled 05/06/2019 for #90 with no refills.  CPE scheduled for 12/31/2019.

## 2019-11-17 ENCOUNTER — Other Ambulatory Visit: Payer: Self-pay

## 2019-11-17 ENCOUNTER — Inpatient Hospital Stay: Payer: Medicare HMO | Attending: Hematology and Oncology

## 2019-11-17 DIAGNOSIS — E538 Deficiency of other specified B group vitamins: Secondary | ICD-10-CM | POA: Diagnosis not present

## 2019-11-17 MED ORDER — CYANOCOBALAMIN 1000 MCG/ML IJ SOLN
INTRAMUSCULAR | Status: AC
  Filled 2019-11-17: qty 1

## 2019-11-17 MED ORDER — CYANOCOBALAMIN 1000 MCG/ML IJ SOLN
1000.0000 ug | Freq: Once | INTRAMUSCULAR | Status: AC
  Administered 2019-11-17: 1000 ug via INTRAMUSCULAR

## 2019-11-29 ENCOUNTER — Other Ambulatory Visit: Payer: Self-pay | Admitting: Family Medicine

## 2019-11-29 NOTE — Telephone Encounter (Signed)
Last office visit 10/27/2019 with Dr. Lorelei Pont for knee pain.  Last refilled 11/20/2018 for #540 with 5 refills.  CPE scheduled for 12/31/2019.

## 2019-12-01 ENCOUNTER — Inpatient Hospital Stay: Payer: Medicare HMO

## 2019-12-02 ENCOUNTER — Other Ambulatory Visit: Payer: Self-pay | Admitting: Podiatry

## 2019-12-02 DIAGNOSIS — M205X2 Other deformities of toe(s) (acquired), left foot: Secondary | ICD-10-CM | POA: Diagnosis not present

## 2019-12-02 DIAGNOSIS — M7752 Other enthesopathy of left foot: Secondary | ICD-10-CM | POA: Diagnosis not present

## 2019-12-02 DIAGNOSIS — M25572 Pain in left ankle and joints of left foot: Secondary | ICD-10-CM | POA: Diagnosis not present

## 2019-12-02 DIAGNOSIS — M2042 Other hammer toe(s) (acquired), left foot: Secondary | ICD-10-CM | POA: Diagnosis not present

## 2019-12-02 MED ORDER — MELOXICAM 15 MG PO TABS: 15.0000 mg | ORAL_TABLET | Freq: Every day | ORAL | 1 refills | Status: DC

## 2019-12-02 MED ORDER — OXYCODONE-ACETAMINOPHEN 5-325 MG PO TABS: 1.0000 | ORAL_TABLET | ORAL | 0 refills | Status: DC | PRN

## 2019-12-02 NOTE — Progress Notes (Signed)
PRN postop 

## 2019-12-10 ENCOUNTER — Encounter: Payer: Self-pay | Admitting: Podiatry

## 2019-12-10 ENCOUNTER — Other Ambulatory Visit: Payer: Self-pay

## 2019-12-10 ENCOUNTER — Ambulatory Visit (INDEPENDENT_AMBULATORY_CARE_PROVIDER_SITE_OTHER): Payer: Medicare HMO | Admitting: Podiatry

## 2019-12-10 ENCOUNTER — Ambulatory Visit (INDEPENDENT_AMBULATORY_CARE_PROVIDER_SITE_OTHER): Payer: Medicare HMO

## 2019-12-10 DIAGNOSIS — M2032 Hallux varus (acquired), left foot: Secondary | ICD-10-CM | POA: Diagnosis not present

## 2019-12-10 DIAGNOSIS — Z9889 Other specified postprocedural states: Secondary | ICD-10-CM

## 2019-12-10 MED ORDER — DOXYCYCLINE HYCLATE 100 MG PO TABS: 100.0000 mg | ORAL_TABLET | Freq: Two times a day (BID) | ORAL | 0 refills | Status: DC

## 2019-12-10 NOTE — Progress Notes (Signed)
   Subjective:  Patient presents today status post left great toe arthrodesis. DOS: 12/02/19.  Patient states that she is doing very well and has minimal pain.  She is kept the dressings clean dry and intact as instructed.  She is minimally weightbearing in the cam boot.  No new complaints at this time  Past Medical History:  Diagnosis Date  . Anemia   . Arthritis   . 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  Mastectomy/ SLN.  Marland Kitchen Chronic headache   . Complication of anesthesia    prior to 1991 used to have PONV.  none recently.  . Diverticulosis of colon   . Family history of adverse reaction to anesthesia    mother/daighter get sick  . GERD with stricture   . Hard of hearing   . Heart murmur    followed by PCP  . Hiatal hernia   . Hypertension   . Neuropathy    feet  . Osteoporosis   . Pre-diabetes   . Wears dentures    full upper      Objective/Physical Exam Neurovascular status intact.  Skin incisions appear to be well coapted with sutures intact. No sign of infectious process noted. No dehiscence. No active bleeding noted. Moderate edema noted to the surgical extremity.  Radiographic Exam:  Orthopedic hardware and osteotomies sites appear to be stable with routine healing.  Orthopedic screw intact with the toe in a more rectus alignment compared to prior x-rays  Assessment: 1. s/p left great toe arthrodesis. DOS: 12/02/2019   Plan of Care:  1. Patient was evaluated. X-rays reviewed 2.  Dressings changed today.  Clean dry and intact x1 week 3.  Continue minimal weightbearing in the cam boot 4.  There is some increased erythema around the toe.  Very mild.  Prescription for doxycycline 100 mg 2 times daily x10 days  5.  Return to clinic in 1 week   Edrick Kins, DPM [facility]  Dr. Edrick Kins, DPM    [address], [place] [address]                Office [phone]  Fax [phone]

## 2019-12-13 ENCOUNTER — Telehealth: Payer: Self-pay

## 2019-12-13 NOTE — Telephone Encounter (Signed)
Patient called and said that she is having a lot of pain in her surgical foot and the Oxycodone made her very nauseated.  She also said she has been taking the Tramadol and Tylenol and the Tramadol is not working now.  Can you send in Hydrocodone and Zofran to [facility] please?

## 2019-12-15 ENCOUNTER — Inpatient Hospital Stay: Payer: Medicare HMO | Attending: Hematology and Oncology

## 2019-12-15 ENCOUNTER — Other Ambulatory Visit: Payer: Self-pay

## 2019-12-15 DIAGNOSIS — E538 Deficiency of other specified B group vitamins: Secondary | ICD-10-CM | POA: Insufficient documentation

## 2019-12-15 MED ORDER — CYANOCOBALAMIN 1000 MCG/ML IJ SOLN
1000.0000 ug | Freq: Once | INTRAMUSCULAR | Status: AC
  Administered 2019-12-15: 1000 ug via INTRAMUSCULAR

## 2019-12-21 ENCOUNTER — Ambulatory Visit (INDEPENDENT_AMBULATORY_CARE_PROVIDER_SITE_OTHER): Payer: Medicare HMO | Admitting: Podiatry

## 2019-12-21 ENCOUNTER — Other Ambulatory Visit: Payer: Self-pay

## 2019-12-21 DIAGNOSIS — M7752 Other enthesopathy of left foot: Secondary | ICD-10-CM

## 2019-12-21 DIAGNOSIS — Z9889 Other specified postprocedural states: Secondary | ICD-10-CM

## 2019-12-21 DIAGNOSIS — M2032 Hallux varus (acquired), left foot: Secondary | ICD-10-CM

## 2019-12-21 NOTE — Progress Notes (Signed)
   Subjective:  Patient presents today status post left great toe arthrodesis. DOS: 12/02/19.  Patient states that she is doing very well and has minimal pain.  She is kept the dressings clean dry and intact as instructed.  She is minimally weightbearing in the cam boot.  No new complaints at this time  Past Medical History:  Diagnosis Date  . Anemia   . Arthritis   . 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  Mastectomy/ SLN.  Marland Kitchen Chronic headache   . Complication of anesthesia    prior to 1991 used to have PONV.  none recently.  . Diverticulosis of colon   . Family history of adverse reaction to anesthesia    mother/daighter get sick  . GERD with stricture   . Hard of hearing   . Heart murmur    followed by PCP  . Hiatal hernia   . Hypertension   . Neuropathy    feet  . Osteoporosis   . Pre-diabetes   . Wears dentures    full upper      Objective/Physical Exam Neurovascular status intact.  Skin incisions appear to be well coapted with sutures intact. No sign of infectious process noted. No dehiscence. No active bleeding noted. Moderate edema noted to the surgical extremity.  Assessment: 1. s/p left great toe arthrodesis. DOS: 12/02/2019   Plan of Care:  1. Patient was evaluated.  Sutures removed today 2.  Patient may begin washing and getting the foot wet 3.  Discontinue cam boot.  Postsurgical shoe dispensed today.  Minimal weightbearing as tolerated Four.  Return to clinic in 3 weeks for follow-up x-ray  Edrick Kins, DPM [facility]  Dr. Edrick Kins, DPM    [address], [place] [address]                Office [phone]  Fax [phone]

## 2019-12-23 ENCOUNTER — Other Ambulatory Visit: Payer: Self-pay | Admitting: Family Medicine

## 2019-12-23 ENCOUNTER — Telehealth: Payer: Self-pay | Admitting: Family Medicine

## 2019-12-23 ENCOUNTER — Other Ambulatory Visit: Payer: Medicare HMO

## 2019-12-23 ENCOUNTER — Ambulatory Visit: Payer: Medicare HMO

## 2019-12-23 ENCOUNTER — Other Ambulatory Visit: Payer: Self-pay

## 2019-12-23 ENCOUNTER — Other Ambulatory Visit (INDEPENDENT_AMBULATORY_CARE_PROVIDER_SITE_OTHER): Payer: Medicare HMO

## 2019-12-23 DIAGNOSIS — D509 Iron deficiency anemia, unspecified: Secondary | ICD-10-CM

## 2019-12-23 DIAGNOSIS — E538 Deficiency of other specified B group vitamins: Secondary | ICD-10-CM

## 2019-12-23 DIAGNOSIS — E1140 Type 2 diabetes mellitus with diabetic neuropathy, unspecified: Secondary | ICD-10-CM

## 2019-12-23 LAB — CBC WITH DIFFERENTIAL/PLATELET
Basophils Absolute: 0 10*3/uL (ref 0.0–0.1)
Basophils Relative: 0.6 % (ref 0.0–3.0)
Eosinophils Absolute: 0.1 10*3/uL (ref 0.0–0.7)
Eosinophils Relative: 1.9 % (ref 0.0–5.0)
HCT: 33.2 % — ABNORMAL LOW (ref 36.0–46.0)
Hemoglobin: 10.9 g/dL — ABNORMAL LOW (ref 12.0–15.0)
Lymphocytes Relative: 26.6 % (ref 12.0–46.0)
Lymphs Abs: 1.2 10*3/uL (ref 0.7–4.0)
MCHC: 32.8 g/dL (ref 30.0–36.0)
MCV: 82.5 fl (ref 78.0–100.0)
Monocytes Absolute: 0.5 10*3/uL (ref 0.1–1.0)
Monocytes Relative: 10 % (ref 3.0–12.0)
Neutro Abs: 2.8 10*3/uL (ref 1.4–7.7)
Neutrophils Relative %: 60.9 % (ref 43.0–77.0)
Platelets: 170 10*3/uL (ref 150.0–400.0)
RBC: 4.03 Mil/uL (ref 3.87–5.11)
RDW: 17.8 % — ABNORMAL HIGH (ref 11.5–15.5)
WBC: 4.6 10*3/uL (ref 4.0–10.5)

## 2019-12-23 LAB — COMPREHENSIVE METABOLIC PANEL
ALT: 10 U/L (ref 0–35)
AST: 13 U/L (ref 0–37)
Albumin: 3.8 g/dL (ref 3.5–5.2)
Alkaline Phosphatase: 47 U/L (ref 39–117)
BUN: 9 mg/dL (ref 6–23)
CO2: 30 mEq/L (ref 19–32)
Calcium: 8.8 mg/dL (ref 8.4–10.5)
Chloride: 102 mEq/L (ref 96–112)
Creatinine, Ser: 0.65 mg/dL (ref 0.40–1.20)
GFR: 85.43 mL/min (ref >60.00)
Glucose, Bld: 87 mg/dL (ref 70–99)
Potassium: 3.1 mEq/L — ABNORMAL LOW (ref 3.5–5.1)
Sodium: 139 mEq/L (ref 135–145)
Total Bilirubin: 0.4 mg/dL (ref 0.2–1.2)
Total Protein: 5.9 g/dL — ABNORMAL LOW (ref 6.0–8.3)

## 2019-12-23 LAB — LIPID PANEL
Cholesterol: 117 mg/dL (ref 0–200)
HDL: 58.4 mg/dL (ref >39.00)
LDL Cholesterol: 42 mg/dL (ref 0–99)
NonHDL: 58.92
Total CHOL/HDL Ratio: 2
Triglycerides: 84 mg/dL (ref 0.0–149.0)
VLDL: 16.8 mg/dL (ref 0.0–40.0)

## 2019-12-23 LAB — HEMOGLOBIN A1C: Hgb A1c MFr Bld: 6.2 % (ref 4.6–6.5)

## 2019-12-23 LAB — VITAMIN B12: Vitamin B-12: 670 pg/mL (ref 211–911)

## 2019-12-23 NOTE — Telephone Encounter (Signed)
-----   Message from Cloyd Stagers, RT sent at 12/21/2019  1:30 PM EST ----- Regarding: Lab Orders for Thursday 12.2.2021 Please place lab orders for Thursday 12.2.2021, office visit for physical on Friday 12.10.2021 Thank you, Dyke Maes RT(R)

## 2019-12-23 NOTE — Progress Notes (Signed)
No critical labs need to be addressed urgently. We will discuss labs in detail at upcoming office visit.   

## 2019-12-29 ENCOUNTER — Inpatient Hospital Stay: Payer: Medicare HMO

## 2019-12-31 ENCOUNTER — Encounter: Payer: Medicare HMO | Admitting: Family Medicine

## 2020-01-04 ENCOUNTER — Encounter: Payer: Medicare HMO | Admitting: Podiatry

## 2020-01-07 ENCOUNTER — Encounter: Payer: Medicare HMO | Admitting: Podiatry

## 2020-01-11 ENCOUNTER — Ambulatory Visit (INDEPENDENT_AMBULATORY_CARE_PROVIDER_SITE_OTHER): Payer: Medicare HMO

## 2020-01-11 ENCOUNTER — Ambulatory Visit (INDEPENDENT_AMBULATORY_CARE_PROVIDER_SITE_OTHER): Payer: Medicare HMO | Admitting: Podiatry

## 2020-01-11 ENCOUNTER — Other Ambulatory Visit: Payer: Self-pay

## 2020-01-11 DIAGNOSIS — Z9889 Other specified postprocedural states: Secondary | ICD-10-CM | POA: Diagnosis not present

## 2020-01-11 NOTE — Progress Notes (Signed)
   Subjective:  Patient presents today status post left great toe arthrodesis. DOS: 12/02/19.  Patient states that she is doing very well and has minimal pain.  She states that last week she has been weightbearing in regular tennis shoes.  No new complaints at this time Past Medical History:  Diagnosis Date  . Anemia   . Arthritis   . 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  Mastectomy/ SLN.  Marland Kitchen Chronic headache   . Complication of anesthesia    prior to 1991 used to have PONV.  none recently.  . Diverticulosis of colon   . Family history of adverse reaction to anesthesia    mother/daighter get sick  . GERD with stricture   . Hard of hearing   . Heart murmur    followed by PCP  . Hiatal hernia   . Hypertension   . Neuropathy    feet  . Osteoporosis   . Pre-diabetes   . Wears dentures    full upper      Objective/Physical Exam Neurovascular status intact.  Skin incisions appear to be well coapted and healed. No sign of infectious process noted. No dehiscence. No active bleeding noted. Moderate edema noted to the surgical extremity.  Radiographic exam Orthopedic screw appears to be intact and unchanged since last x-rays.  Routine healing of the IPJ noted  Assessment: 1. s/p left great toe arthrodesis. DOS: 12/02/2019   Plan of Care:  1. Patient was evaluated.   2.  Patient may resume full activity no restrictions 3.  Return to clinic as needed  Edrick Kins, DPM [facility]  Dr. Edrick Kins, DPM    [address], [place] [address]                Office [phone]  Fax [phone]

## 2020-01-12 ENCOUNTER — Inpatient Hospital Stay: Payer: Medicare HMO | Attending: Hematology and Oncology

## 2020-01-12 DIAGNOSIS — E538 Deficiency of other specified B group vitamins: Secondary | ICD-10-CM

## 2020-01-12 MED ORDER — CYANOCOBALAMIN 1000 MCG/ML IJ SOLN
INTRAMUSCULAR | Status: AC
  Filled 2020-01-12: qty 1

## 2020-01-12 MED ORDER — CYANOCOBALAMIN 1000 MCG/ML IJ SOLN
1000.0000 ug | Freq: Once | INTRAMUSCULAR | Status: AC
  Administered 2020-01-12: 13:00:00 1000 ug via INTRAMUSCULAR

## 2020-01-18 ENCOUNTER — Ambulatory Visit: Payer: Medicare HMO

## 2020-01-24 ENCOUNTER — Other Ambulatory Visit: Payer: Self-pay | Admitting: Podiatry

## 2020-01-26 ENCOUNTER — Inpatient Hospital Stay: Payer: Medicare HMO

## 2020-01-27 ENCOUNTER — Other Ambulatory Visit: Payer: Self-pay

## 2020-01-28 ENCOUNTER — Encounter: Payer: Self-pay | Admitting: Family Medicine

## 2020-01-28 ENCOUNTER — Ambulatory Visit (INDEPENDENT_AMBULATORY_CARE_PROVIDER_SITE_OTHER): Payer: Medicare HMO | Admitting: Family Medicine

## 2020-01-28 VITALS — BP 110/64 | HR 81 | Temp 97.7°F | Ht 59.65 in | Wt 134.2 lb

## 2020-01-28 DIAGNOSIS — E1169 Type 2 diabetes mellitus with other specified complication: Secondary | ICD-10-CM

## 2020-01-28 DIAGNOSIS — Z0000 Encounter for general adult medical examination without abnormal findings: Secondary | ICD-10-CM | POA: Diagnosis not present

## 2020-01-28 DIAGNOSIS — I152 Hypertension secondary to endocrine disorders: Secondary | ICD-10-CM

## 2020-01-28 DIAGNOSIS — D509 Iron deficiency anemia, unspecified: Secondary | ICD-10-CM

## 2020-01-28 DIAGNOSIS — I5032 Chronic diastolic (congestive) heart failure: Secondary | ICD-10-CM

## 2020-01-28 DIAGNOSIS — E785 Hyperlipidemia, unspecified: Secondary | ICD-10-CM

## 2020-01-28 DIAGNOSIS — E876 Hypokalemia: Secondary | ICD-10-CM | POA: Diagnosis not present

## 2020-01-28 DIAGNOSIS — F330 Major depressive disorder, recurrent, mild: Secondary | ICD-10-CM | POA: Diagnosis not present

## 2020-01-28 DIAGNOSIS — E1140 Type 2 diabetes mellitus with diabetic neuropathy, unspecified: Secondary | ICD-10-CM

## 2020-01-28 DIAGNOSIS — E1159 Type 2 diabetes mellitus with other circulatory complications: Secondary | ICD-10-CM | POA: Diagnosis not present

## 2020-01-28 DIAGNOSIS — M810 Age-related osteoporosis without current pathological fracture: Secondary | ICD-10-CM

## 2020-01-28 DIAGNOSIS — E1142 Type 2 diabetes mellitus with diabetic polyneuropathy: Secondary | ICD-10-CM

## 2020-01-28 LAB — HM DIABETES FOOT EXAM

## 2020-01-28 MED ORDER — LOSARTAN POTASSIUM 50 MG PO TABS: 50.0000 mg | ORAL_TABLET | Freq: Every day | ORAL | 11 refills | Status: DC

## 2020-01-28 NOTE — Assessment & Plan Note (Signed)
Stable, chronic.  Diet controlled.  Associated with neuropathy.

## 2020-01-28 NOTE — Assessment & Plan Note (Signed)
Can try trial of increased gabapentin to 100/200/200 mg AM/lunch/PM for better suppertime control.

## 2020-01-28 NOTE — Assessment & Plan Note (Signed)
Stable, chronic.  Continue current medication.    

## 2020-01-28 NOTE — Assessment & Plan Note (Signed)
Chronic, worsened.  Re-eval with iron panel in 2 weeks... as she stopped iron.  Followed by [facility].

## 2020-01-28 NOTE — Assessment & Plan Note (Signed)
Stable, chronic.  Continue current medication.   Atorvastatin 40 mg daily. 

## 2020-01-28 NOTE — Assessment & Plan Note (Signed)
Stop HCTZ.Marland Kitchen re-eval in 2 weeks.

## 2020-01-28 NOTE — Progress Notes (Signed)
Patient ID: Ashley Ryan, female    DOB: 06-01-1945, 75 y.o.   MRN: [id number]  This visit was conducted in person.  BP 110/64   Pulse 81   Temp 97.7 F (36.5 C) (Temporal)   Ht 4' 11.65" (1.515 m)   Wt 134 lb 4 oz (60.9 kg)   SpO2 93%   BMI 26.53 kg/m    CC: medicare wellness Subjective:   HPI: Ashley Ryan is a 74 y.o. female presenting on 01/28/2020 for Medicare Wellness   The patient presents for annual medicare wellness, complete physical and review of chronic health problems. He/She also has the following acute concerns today: right knee pain.Marland Kitchen steroid injection did  Not help much. Using diclofenac gel 1-2 times a day.. tolerable. Need replacement but not ready.  I have personally reviewed the Medicare Annual Wellness questionnaire and have noted 1. The patient's medical and social history 2. Their use of alcohol, tobacco or illicit drugs 3. Their current medications and supplements 4. The patient's functional ability including ADL's, fall risks, home safety risks and hearing or visual             impairment. 5. Diet and physical activities 6. Evidence for depression or mood disorders 7.         Updated provider list Cognitive evaluation was performed and recorded on pt medicare questionnaire form. The patients weight, height, BMI and visual acuity have been recorded in the chart  I have made referrals, counseling and provided education to the patient based review of the above and I have provided the pt with a written personalized care plan for preventive services.   Documentation of this information was scanned into the electronic record under the media tab.   Advance directives and end of life planning reviewed in detail with patient and documented in EMR. Patient given handout on advance care directives if needed. HCPOA and living will updated if needed.  Hearing Screening   _0  _1  _2  _3  _4  _5  _6  _7  _8   Right ear:           Left  ear:           Comments: Wears Bilateral Hearing Aides   Visual Acuity Screening   Right eye Left eye Both eyes  Without correction:     With correction: _9    Fall Risk  01/28/2020 12/15/2018 05/26/2018 11/12/2016 11/09/2015  Falls in the past year? 1 0 0 Yes Yes  Number falls in past yr: 1 0 0 2 or more 2 or more  Injury with Fall? 0 0 0 Yes No  Risk Factor Category  - - - High Fall Risk High Fall Risk  Risk for fall due to : - Medication side effect;Impaired balance/gait - Impaired balance/gait History of fall(s);Other (Comment)  Risk for fall due to: Comment - - - - pt has neuropathy in both feet  Follow up - Falls evaluation completed;Falls prevention discussed - - Falls evaluation completed;Falls prevention discussed   MDD, stable control on prozac but increased stress. Husband has leukemia and is not doing well.  PHQ 9 today 1  Patient Care Team: [name], MD as PCP - General (Family Medicine) End, Harrell Gave, MD as PCP - Cardiology (Cardiology) Leandrew Koyanagi, MD as Referring Physician (Ophthalmology) Bary Castilla Forest Gleason, MD (General Surgery)  Reviewed recent labs in detail with patient    Diabetes: Associated with neuropathy  Excellent control. Lab Results  Component Value Date   HGBA1C  6.2 12/23/2019  Using medications without difficulties: Hypoglycemic episodes:none Hyperglycemic episodes: none Feet problems: no ulcers, did have surgery on left great toe by Dr. Amalia Hailey Blood Sugars averaging:   occ checking,  eye exam within last year: yes  Elevated Cholesterol: LDL at goal 42 on moderate dose atorvastatin Using medications without problems: Muscle aches:  Diet compliance: heart healthy diet Exercise: walking occ. Other complaints:  Wt Readings from Last 3 Encounters:  01/28/20 134 lb 4 oz (60.9 kg)  10/27/19 144 lb 8 oz (65.5 kg)  10/13/19 144 lb (65.3 kg)    Hypertension:  Good control on  ARB.Marland Kitchen on 1/2 losartan HCTZ. Using lasix  prn BP Readings from Last 3 Encounters:  01/28/20 110/64  10/27/19 120/64  10/13/19 110/78  Using medication without problems or lightheadedness: none Chest pain with exertion:none Edema:none Short of breath:none Average home BPs: Other issues: HFpEF stable, euvolemic followed by Dr. Saunders Revel 10/13/2019 ( reviewed note in detail) Potassium again low .. K 3.1 on lasix and Kdur 10 mEQ prn.. using HCTZ 12.5 mg daily   Receiving B12 injections for B12 deficiency.  Anemia HG slightly lower 10.9.Marland Kitchen needs re-eval with iron panel.. no longer on supplement.Marland Kitchen in past was due to [facility] erosions.  Relevant past medical, surgical, family and social history reviewed and updated as indicated. Interim medical history since our last visit reviewed. Allergies and medications reviewed and updated. Outpatient Medications Prior to Visit  Medication Sig Dispense Refill  . Alcohol Swabs (B-D SINGLE USE SWABS REGULAR) PADS Use to check blood sugar once daily 100 each 3  . atorvastatin (LIPITOR) 40 MG tablet TAKE 1 TABLET AT BEDTIME 90 tablet 2  . Blood Glucose Calibration (ACCU-CHEK AVIVA) SOLN Use to check blood sugar once daily 1 each 0  . Blood Glucose Calibration (TRUE METRIX LEVEL 1) Low SOLN Use as directed 1 each 3  . Blood Glucose Monitoring Suppl (TRUE METRIX AIR GLUCOSE METER) w/Device KIT Use to check blood sugar daily 1 kit 0  . Calcium Carbonate-Vitamin D 600-400 MG-UNIT tablet Take 1 tablet by mouth 2 (two) times daily.    Marland Kitchen esomeprazole (NEXIUM) 40 MG capsule TAKE 1 CAPSULE 2 (TWO) TIMES DAILY BEFORE A MEAL. 180 capsule 1  . ferrous sulfate 325 (65 FE) MG tablet Take 325 mg by mouth daily with breakfast.     . FLUoxetine (PROZAC) 20 MG capsule TAKE 1 CAPSULE EVERY DAY 90 capsule 1  . fluticasone (FLONASE) 50 MCG/ACT nasal spray USE 2 SPRAYS INTO THE NOSE DAILY AS DIRECTED (SUBSTITUTED FOR FLONASE) 48 g 3  . furosemide (LASIX) 20 MG tablet TAKE 1 TABLET BY MOUTH EVERY DAY AS NEEDED 30 tablet 5  .  gabapentin (NEURONTIN) 100 MG capsule TAKE 1 CAPSULE IN THE MORNING, 1 CAPSULE AT LUNCH, 1 CAPSULES AT DINNER AND 2 TO 3 CAPSULES AT BEDTIME 540 capsule 5  . Glucosamine 500 MG TABS Take 500 mg by mouth daily.    Marland Kitchen glucose blood (TRUE METRIX BLOOD GLUCOSE TEST) test strip Use to check blood sugar daily 100 each 3  . isosorbide mononitrate (IMDUR) 30 MG 24 hr tablet TAKE 1 TABLET EVERY DAY 90 tablet 3  . latanoprost (XALATAN) 0.005 % ophthalmic solution Place 1 drop into both eyes at bedtime.    . lidocaine (LIDODERM) 5 % APPLY 1 PATCH ONTO THE SKIN EVERY DAY. REMOVE AND DISCARD PATCH WITHIN 12 HOURS OR AS DIRECTED BY PHYSICIAN. 90 patch 3  . loratadine (CLARITIN) 10 MG tablet Take 10 mg by  mouth daily.    Marland Kitchen losartan-hydrochlorothiazide (HYZAAR) 100-25 MG tablet Take 0.5 tablets by mouth daily.    . meloxicam (MOBIC) 15 MG tablet Take 1 tablet (15 mg total) by mouth daily. 30 tablet 1  . Omega-3 Fatty Acids (FISH OIL) 1000 MG CAPS Take 1,000 mg by mouth daily.     . polycarbophil (FIBERCON) 625 MG tablet Take 625 mg by mouth daily.    . potassium chloride (KLOR-CON) 10 MEQ tablet     . tamoxifen (NOLVADEX) 20 MG tablet TAKE 1 TABLET EVERY DAY 90 tablet 3  . tolterodine (DETROL LA) 4 MG 24 hr capsule TAKE 1 CAPSULE BY MOUTH EVERY DAY 90 capsule 3  . traMADol (ULTRAM) 50 MG tablet Take 1 tablet (50 mg total) by mouth every 6 (six) hours as needed. for pain 90 tablet 0  . triamcinolone cream (KENALOG) 0.5 % Apply 1 application topically 2 (two) times daily. 30 g 0  . TRUEplus Lancets 28G MISC Use to check blood sugar daily 100 each 3  . diclofenac Sodium (VOLTAREN) 1 % GEL     . doxycycline (VIBRA-TABS) 100 MG tablet Take 1 tablet (100 mg total) by mouth 2 (two) times daily. 20 tablet 0  . oxyCODONE-acetaminophen (PERCOCET) 5-325 MG tablet Take 1 tablet by mouth every 4 (four) hours as needed for severe pain. 30 tablet 0   No facility-administered medications prior to visit.     Per HPI unless  specifically indicated in ROS section below Review of Systems Objective:  BP 110/64   Pulse 81   Temp 97.7 F (36.5 C) (Temporal)   Ht 4' 11.65" (1.515 m)   Wt 134 lb 4 oz (60.9 kg)   SpO2 93%   BMI 26.53 kg/m   Wt Readings from Last 3 Encounters:  01/28/20 134 lb 4 oz (60.9 kg)  10/27/19 144 lb 8 oz (65.5 kg)  10/13/19 144 lb (65.3 kg)      Physical Exam Constitutional:      General: She is not in acute distress.Vital signs are normal.     Appearance: Normal appearance. She is well-developed and well-nourished. She is not ill-appearing or toxic-appearing.  HENT:     Head: Normocephalic.     Right Ear: Hearing, tympanic membrane, ear canal and external ear normal.     Left Ear: Hearing, tympanic membrane, ear canal and external ear normal.     Nose: Nose normal.  Eyes:     General: Lids are normal. Lids are everted, no foreign bodies appreciated.     Extraocular Movements: EOM normal.     Conjunctiva/sclera: Conjunctivae normal.     Pupils: Pupils are equal, round, and reactive to light.  Neck:     Thyroid: No thyroid mass or thyromegaly.     Vascular: No carotid bruit.     Trachea: Trachea normal.  Cardiovascular:     Rate and Rhythm: Normal rate and regular rhythm.     Pulses: Intact distal pulses.     Heart sounds: Normal heart sounds, S1 normal and S2 normal. No murmur heard. No gallop.   Pulmonary:     Effort: Pulmonary effort is normal. No respiratory distress.     Breath sounds: Normal breath sounds. No wheezing, rhonchi or rales.  Abdominal:     General: Bowel sounds are normal. There is no distension or abdominal bruit.     Palpations: Abdomen is soft. There is no fluid wave, hepatosplenomegaly or mass.     Tenderness: There is no  abdominal tenderness. There is no CVA tenderness, guarding or rebound.     Hernia: No hernia is present.  Musculoskeletal:     Cervical back: Normal range of motion and neck supple.  Lymphadenopathy:     Cervical: No cervical  adenopathy.     Upper Body:  No axillary adenopathy present. Skin:    General: Skin is warm, dry and intact.     Findings: No rash.  Neurological:     Mental Status: She is alert.     Cranial Nerves: No cranial nerve deficit.     Sensory: No sensory deficit.     Deep Tendon Reflexes: Strength normal.  Psychiatric:        Mood and Affect: Mood is not anxious or depressed.        Speech: Speech normal.        Behavior: Behavior normal. Behavior is cooperative.        Cognition and Memory: Cognition and memory normal.        Judgment: Judgment normal.      Diabetic foot exam: Normal inspection No skin breakdown.. healed great toe No calluses  Normal DP pulses NO sensation to light touch and monofilament Nails normal     Results for orders placed or performed in visit on 12/23/19  CBC with Differential/Platelet  Result Value Ref Range   WBC 4.6 4.0 - 10.5 K/uL   RBC 4.03 3.87 - 5.11 Mil/uL   Hemoglobin 10.9 (L) 12.0 - 15.0 g/dL   HCT 33.2 (L) 36.0 - 46.0 %   MCV 82.5 78.0 - 100.0 fl   MCHC 32.8 30.0 - 36.0 g/dL   RDW 17.8 (H) 11.5 - 15.5 %   Platelets 170.0 150.0 - 400.0 K/uL   Neutrophils Relative % 60.9 43.0 - 77.0 %   Lymphocytes Relative 26.6 12.0 - 46.0 %   Monocytes Relative 10.0 3.0 - 12.0 %   Eosinophils Relative 1.9 0.0 - 5.0 %   Basophils Relative 0.6 0.0 - 3.0 %   Neutro Abs 2.8 1.4 - 7.7 K/uL   Lymphs Abs 1.2 0.7 - 4.0 K/uL   Monocytes Absolute 0.5 0.1 - 1.0 K/uL   Eosinophils Absolute 0.1 0.0 - 0.7 K/uL   Basophils Absolute 0.0 0.0 - 0.1 K/uL  Vitamin B12  Result Value Ref Range   Vitamin B-12 670 211 - 911 pg/mL  Comprehensive metabolic panel  Result Value Ref Range   Sodium 139 135 - 145 mEq/L   Potassium 3.1 (L) 3.5 - 5.1 mEq/L   Chloride 102 96 - 112 mEq/L   CO2 30 19 - 32 mEq/L   Glucose, Bld 87 70 - 99 mg/dL   BUN 9 6 - 23 mg/dL   Creatinine, Ser 0.65 0.40 - 1.20 mg/dL   Total Bilirubin 0.4 0.2 - 1.2 mg/dL   Alkaline Phosphatase 47 39 -  117 U/L   AST 13 0 - 37 U/L   ALT 10 0 - 35 U/L   Total Protein 5.9 (L) 6.0 - 8.3 g/dL   Albumin 3.8 3.5 - 5.2 g/dL   GFR 85.43 >60.00 mL/min   Calcium 8.8 8.4 - 10.5 mg/dL  Lipid panel  Result Value Ref Range   Cholesterol 117 0 - 200 mg/dL   Triglycerides 84.0 0.0 - 149.0 mg/dL   HDL 58.40 >39.00 mg/dL   VLDL 16.8 0.0 - 40.0 mg/dL   LDL Cholesterol 42 0 - 99 mg/dL   Total CHOL/HDL Ratio 2    NonHDL 58.92  Hemoglobin A1c  Result Value Ref Range   Hgb A1c MFr Bld 6.2 4.6 - 6.5 %    This visit occurred during the SARS-CoV-2 public health emergency.  Safety protocols were in place, including screening questions prior to the visit, additional usage of staff PPE, and extensive cleaning of exam room while observing appropriate contact time as indicated for disinfecting solutions.   COVID 19 screen:  No recent travel or known exposure to COVID19 The patient denies respiratory symptoms of COVID 19 at this time. The importance of social distancing was discussed today.   Assessment and Plan The patient's preventative maintenance and recommended screening tests for an annual wellness exam were reviewed in full today. Brought up to date unless services declined.  Counselled on the importance of diet, exercise, and its role in overall health and mortality. The patient's FH and SH was reviewed, including their home life, tobacco status, and drug and alcohol status.   Vaccines:uptodate with COVID x 2, encouraged booster. DEXA: Has stomach ulcer 11/2013 on PPI nexium. SE to fosamax 11/2014: osteoprosisStartedBonivaas of 10/2015, now off. 11/2016 She has not been taking it and not sure why. osteoporosis  Improved to osteopenia 2019,  Colon: 05/2019, Dr. Radford Pax, repeat 5 year. Mammo: nml 07/2018, sister breast cancer age 57. Plans yearly. PAP/DVE: partial hysterectomy, no pap smear or DVE required,. No family history of ovarian cancer.  Hep C: neg  Problem List Items  Addressed This Visit    (HFpEF) heart failure with preserved ejection fraction (HCC) (Chronic)    Stable, chronic.  Continue current medication.   Euvolemic. Lasix prn.      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  Diet controlled.  Associated with neuropathy.      Relevant Medications   losartan (COZAAR) 50 MG tablet   Other Relevant Orders   Microalbumin / creatinine urine ratio   Diabetic peripheral neuropathy (HCC) (Chronic)     Can try trial of increased gabapentin to 100/200/200 mg AM/lunch/PM for better suppertime control.      Relevant Medications   losartan (COZAAR) 50 MG tablet   Hyperlipidemia associated with type 2 diabetes mellitus (HCC) (Chronic)    Stable, chronic.  Continue current medication.   Atorvastatin 40 mg daily      Relevant Medications   losartan (COZAAR) 50 MG tablet   Hypertension associated with diabetes (HCC) (Chronic)    Chronic.Marland Kitchen occ droping low   Given low potassium despite rarely needing lasix... will D/C combo losartan/HCTZ and change to losartan 50 mg alone      Relevant Medications   losartan (COZAAR) 50 MG tablet   Hypokalemia    Stop HCTZ.Marland Kitchen re-eval in 2 weeks.       Relevant Orders   Basic metabolic panel   Iron deficiency anemia (Chronic)    Chronic, worsened.  Re-eval with iron panel in 2 weeks... as she stopped iron.  Followed by Heme.      Relevant Orders   IBC + Ferritin   MDD (major depressive disorder), mild (HCC) (Chronic)    Stable, chronic.  Continue current medication.          Other Visit Diagnoses    Medicare annual wellness visit, subsequent    -  Primary   Age related osteoporosis, unspecified pathological fracture presence             Eliezer Lofts, MD

## 2020-01-28 NOTE — Assessment & Plan Note (Signed)
Stable, chronic.  Continue current medication.     Euvolemic. Lasix prn.   

## 2020-01-28 NOTE — Patient Instructions (Addendum)
Stop losartan HCTZ... change to losartan alone.  Can use lasix with potassium as needed if swelling.  Please call the location of your choice from the menu below to schedule your Mammogram appointment [place]   1. [facility]                      Phone:  [phone] [address], [place] [address]                                                             Services: Traditional and 3D Mammogram, Bone Density   2. [place] [facility]                 Phone: [phone] [address], [place] [address]    Service: Bone Density ONLY   *this site does NOT perform mammograms  3. [place]                        Phone:  [phone] [place], [place] [address]                                            Services:  3D Mammogram and Bone Density    [place]  1. [place] at [facility]   Phone:  [phone]   [place], [place] [address]                                            Services: 3D Mammogram and Bone Density  2. [place] at [facility])  Phone:  [phone]   [address]  [place], [place] [address]                                              Services:  3D Mammogram and Bone Density  

## 2020-01-28 NOTE — Assessment & Plan Note (Signed)
Chronic.Marland Kitchen occ droping low   Given low potassium despite rarely needing lasix... will D/C combo losartan/HCTZ and change to losartan 50 mg alone

## 2020-01-30 NOTE — Telephone Encounter (Signed)
Please advise 

## 2020-02-09 ENCOUNTER — Inpatient Hospital Stay: Payer: Medicare HMO | Attending: Hematology and Oncology

## 2020-02-09 ENCOUNTER — Other Ambulatory Visit: Payer: Self-pay

## 2020-02-09 DIAGNOSIS — E538 Deficiency of other specified B group vitamins: Secondary | ICD-10-CM | POA: Insufficient documentation

## 2020-02-09 MED ORDER — CYANOCOBALAMIN 1000 MCG/ML IJ SOLN
1000.0000 ug | Freq: Once | INTRAMUSCULAR | Status: AC
  Administered 2020-02-09: 1000 ug via INTRAMUSCULAR

## 2020-02-09 MED ORDER — CYANOCOBALAMIN 1000 MCG/ML IJ SOLN
INTRAMUSCULAR | Status: AC
  Filled 2020-02-09: qty 1

## 2020-02-11 ENCOUNTER — Other Ambulatory Visit: Payer: Medicare HMO

## 2020-02-14 ENCOUNTER — Other Ambulatory Visit: Payer: Medicare HMO

## 2020-02-21 ENCOUNTER — Other Ambulatory Visit: Payer: Self-pay | Admitting: Family Medicine

## 2020-02-21 MED ORDER — LOSARTAN POTASSIUM 50 MG PO TABS: 50.0000 mg | ORAL_TABLET | Freq: Every day | ORAL | 3 refills | Status: DC

## 2020-02-21 NOTE — Telephone Encounter (Signed)
Last office visit 01/28/2020 for [place].  Last refilled 11/15/2019 for #90 with no refills.  Next Appt: 07/27/2020 for 6 month follow up.

## 2020-02-23 ENCOUNTER — Ambulatory Visit: Payer: Medicare HMO

## 2020-02-23 ENCOUNTER — Other Ambulatory Visit: Payer: Medicare HMO

## 2020-02-23 ENCOUNTER — Ambulatory Visit: Payer: Medicare HMO | Admitting: Hematology and Oncology

## 2020-02-27 NOTE — Progress Notes (Deleted)
    Eufemio Strahm T. Raymond Azure, MD, [place]  Primary [facility] and [place] at [facility] [place], [address]  Phone: [phone]  FAX: [phone]  Ashley Ryan - 75 y.o. female  MRN [id number]  Date of Birth: [birth year]  Date: 02/28/2020  PCP: [name], MD  Referral: [name], MD  No chief complaint on file.   This visit occurred during the SARS-CoV-2 public health emergency.  Safety protocols were in place, including screening questions prior to the visit, additional usage of staff PPE, and extensive cleaning of exam room while observing appropriate contact time as indicated for disinfecting solutions.   Subjective:   Ashley Ryan is a 75 y.o. very pleasant female patient with There is no height or weight on file to calculate BMI. who presents with the following:  She is a pleasant 75 year old who I remember very well.  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  Previously I have done some steroid injections which is helped to some degree, and we did try a Monovisc injection, and this did not really help all that much.    Review of Systems is noted in the HPI, as appropriate   Objective:   There were no vitals taken for this visit.  ***  Radiology: DG Foot Complete Left  Result Date: 02/03/2020 Please see detailed radiograph report in office note.   Assessment and Plan:   ***

## 2020-02-28 ENCOUNTER — Ambulatory Visit: Payer: Medicare HMO | Admitting: Family Medicine

## 2020-02-28 DIAGNOSIS — M1711 Unilateral primary osteoarthritis, right knee: Secondary | ICD-10-CM

## 2020-03-02 ENCOUNTER — Other Ambulatory Visit: Payer: Self-pay

## 2020-03-02 ENCOUNTER — Ambulatory Visit (INDEPENDENT_AMBULATORY_CARE_PROVIDER_SITE_OTHER): Payer: Medicare HMO | Admitting: Family Medicine

## 2020-03-02 ENCOUNTER — Encounter: Payer: Self-pay | Admitting: Family Medicine

## 2020-03-02 VITALS — BP 140/80 | HR 80 | Temp 97.8°F | Ht 60.0 in | Wt 137.0 lb

## 2020-03-02 DIAGNOSIS — D509 Iron deficiency anemia, unspecified: Secondary | ICD-10-CM

## 2020-03-02 DIAGNOSIS — M1711 Unilateral primary osteoarthritis, right knee: Secondary | ICD-10-CM

## 2020-03-02 DIAGNOSIS — E1140 Type 2 diabetes mellitus with diabetic neuropathy, unspecified: Secondary | ICD-10-CM

## 2020-03-02 DIAGNOSIS — E876 Hypokalemia: Secondary | ICD-10-CM | POA: Diagnosis not present

## 2020-03-02 LAB — MICROALBUMIN / CREATININE URINE RATIO
Creatinine,U: 103.1 mg/dL
Microalb Creat Ratio: 0.7 mg/g (ref 0.0–30.0)
Microalb, Ur: <0.7 mg/dL (ref 0.0–1.9)

## 2020-03-02 LAB — BASIC METABOLIC PANEL
BUN: 11 mg/dL (ref 6–23)
CO2: 26 mEq/L (ref 19–32)
Calcium: 8.7 mg/dL (ref 8.4–10.5)
Chloride: 106 mEq/L (ref 96–112)
Creatinine, Ser: 0.66 mg/dL (ref 0.40–1.20)
GFR: 85 mL/min (ref >60.00)
Glucose, Bld: 93 mg/dL (ref 70–99)
Potassium: 3.8 mEq/L (ref 3.5–5.1)
Sodium: 139 mEq/L (ref 135–145)

## 2020-03-02 LAB — IBC + FERRITIN
Ferritin: 31.4 ng/mL (ref 10.0–291.0)
Iron: 38 ug/dL — ABNORMAL LOW (ref 42–145)
Saturation Ratios: 12.8 % — ABNORMAL LOW (ref 20.0–50.0)
Transferrin: 212 mg/dL (ref 212.0–360.0)

## 2020-03-02 MED ORDER — TRIAMCINOLONE ACETONIDE 40 MG/ML IJ SUSP
40.0000 mg | Freq: Once | INTRAMUSCULAR | Status: AC
  Administered 2020-03-02: 40 mg via INTRA_ARTICULAR

## 2020-03-02 NOTE — Progress Notes (Signed)
    Ashley Ryan T. Ashley Bartmess, MD, [place] at [facility] [place], [address]  Phone: [phone]  FAX: [phone]  Ashley Ryan - 75 y.o. female  MRN [id number]  Date of Birth: 1945/11/11  Date: 03/02/2020  PCP: [name], MD  Referral: [name], MD  Chief Complaint  Patient presents with  . Knee Pain    Right    This visit occurred during the SARS-CoV-2 public health emergency.  Safety protocols were in place, including screening questions prior to the visit, additional usage of staff PPE, and extensive cleaning of exam room while observing appropriate contact time as indicated for disinfecting solutions.    F/u kne oa, R, with known end-stage lateral disease   inj r knee  Aspiration/Injection Procedure Note Ashley Ryan [birth year] Date of procedure: 03/02/2020  Procedure: Large Joint Aspiration / Injection of Knee, R Indications: Pain  Procedure Details Patient verbally consented to procedure. Risks, benefits, and alternatives explained. Sterilely prepped with Chloraprep. Ethyl cholride used for anesthesia. 9 cc Lidocaine 1% mixed with 1 mL of Kenalog 40 mg injected using the anteromedial approach without difficulty. No complications with procedure and tolerated well. Patient had decreased pain post-injection. Medication: 1 mL of Kenalog 40 mg     ICD-10-CM   1. Primary osteoarthritis of right knee  M17.11 triamcinolone acetonide (KENALOG-40) injection 40 mg  2. Controlled type 2 diabetes with neuropathy (HCC)  E11.40 Microalbumin / creatinine urine ratio  3. Hypokalemia  H96.2 Basic metabolic panel  4. Iron deficiency anemia, unspecified iron deficiency anemia type  D50.9 IBC + Ferritin   My partner had ordered labs - not relevant to my appointment.  Meds ordered this encounter  Medications  . triamcinolone acetonide (KENALOG-40) injection 40 mg

## 2020-03-07 NOTE — Progress Notes (Signed)
[facility]  [address] [place], [place] [address] Phone: [phone]  Fax: [phone]   Office Visit:  03/08/2020  Referring physician: [name], MD  Chief Complaint: Ashley Ryan is a 75 y.o. female with left breast DCIS s/p mastectomyand B12 deficiency who is seen for 7 month assessment.   HPI: The patient was last seen in the medical oncology clinic on 08/10/2019. At that time, she is doing well.  She denies any breast concerns.  Exam is stable except for RLE erythema associated with an insect bite; she was on doxycycline. Hematocrit was 36.4, hemoglobin 11.5, platelets 202,000, WBC 6,200. Ferritin was 89 with an iron saturation of 11% and a TIBC of 269. Potassium was 2.7.Folate was 8.3. She continued tamoxifen, calcium, and vitamin D. She continued oral iron.   The patient received vitamin B12 injections monthly x 7 (08/10/2019 - 02/09/2020).  During the interim, the patient has been "ok". Her blood pressure has been running high. Over the past few days, it has run between 148/74 - 180/99. The patient spoke to her PCP about this. She was supposed to be taking 50 mg Losartan and was told to increase in to 100 mg. However, the patient realized this morning that she has actually been taking 100 mg of Losartan accidentally. She will let her PCP know.  The patient eats well. She eats a big meal around midday and a small meal at night. She got a cortisone shot in her right knee, which helped her pain. She has occasional shortness of breath.  The patient performs monthly breast self exams. She states that she found something in her right breast a couple of weeks ago. The area is not tender. She is taking tamoxifen, calcium, and vitamin D.   Past Medical History:  Diagnosis Date  . Anemia   . Arthritis   . Breast cancer ([place]) 06/13/2017   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  Mastectomy/ SLN.  Marland Kitchen Chronic headache   . Complication of  anesthesia    prior to 1991 used to have PONV.  none recently.  . Diverticulosis of colon   . Family history of adverse reaction to anesthesia    mother/daighter get sick  . GERD with stricture   . Hard of hearing   . Heart murmur    followed by PCP  . Hiatal hernia   . Hypertension   . Neuropathy    feet  . Osteoporosis   . Pre-diabetes   . Wears dentures    full upper    Past Surgical History:  Procedure Laterality Date  . ABDOMINAL HYSTERECTOMY  1978   one ovary remains  . 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 CARDIAC CATHETERIZATION     "yrs ago" all OK.  Marland Kitchen CATARACT EXTRACTION W/PHACO Left 02/21/2016   Procedure: CATARACT EXTRACTION PHACO AND INTRAOCULAR LENS PLACEMENT ([place])  Left eye;  Surgeon: Leandrew Koyanagi, MD;  Location: [place];  Service: Ophthalmology;  Laterality: Left;  Left eye Diabetic  . CATARACT EXTRACTION W/PHACO Right 03/13/2016   Procedure: CATARACT EXTRACTION PHACO AND INTRAOCULAR LENS PLACEMENT ([facility])  right  diabetic;  Surgeon: Leandrew Koyanagi, MD;  Location: [place];  Service: Ophthalmology;  Laterality: Right;  diabetic  . CHOLECYSTECTOMY  1978  . COLONOSCOPY W/ POLYPECTOMY  09/09/2013   8 mm tubular adenoma of the cecum.  Erskine Emery, MD. [facility]  . COLONOSCOPY WITH PROPOFOL N/A 06/04/2019   Procedure: COLONOSCOPY WITH PROPOFOL;  Surgeon: Lucilla Lame, MD;  Location: [facility];  Service: Endoscopy;  Laterality: N/A;  . ESOPHAGOGASTRODUODENOSCOPY (EGD) WITH PROPOFOL N/A 06/04/2019   Procedure: ESOPHAGOGASTRODUODENOSCOPY (EGD) WITH PROPOFOL;  Surgeon: Lucilla Lame, MD;  Location: [facility] ENDOSCOPY;  Service: Endoscopy;  Laterality: N/A;  . JOINT REPLACEMENT Left    knee   . LEFT HEART CATH AND CORONARY ANGIOGRAPHY N/A 11/06/2017   Procedure: LEFT HEART CATH AND CORONARY ANGIOGRAPHY;  Surgeon: Nelva Bush, MD;  Location: [place] [facility];  Service: Cardiovascular;  Laterality: N/A;  . MASTECTOMY Left 06/13/2017   mastectomy neg margins  . OOPHORECTOMY     one still left  . RENAL ANGIOGRAPHY  11/06/2017   Procedure: RENAL ANGIOGRAPHY;  Surgeon: Nelva Bush, MD;  Location: [place] [facility];  Service: Cardiovascular;;  . SENTINEL NODE BIOPSY Left 06/13/2017   Procedure: SENTINEL NODE BIOPSY;  Surgeon: Robert Bellow, MD;  Location: [facility];  Service: General;  Laterality: Left;  . 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  Surgeon: Robert Bellow, MD;  Location: [facility];  Service: General;  Laterality: Left;    Family History  Problem Relation Age of Onset  . Breast cancer Other   . Stomach cancer Other   . Diabetes Other   . Heart disease Other   . Breast cancer Sister 37  . Esophageal cancer Brother   . Hypertension Mother   . Heart attack Mother 63  . Hypertension Father   . Coronary artery disease Father 28       CABG  . Stroke Father     Social History:  reports that she has never smoked. She has never used smokeless tobacco. She reports that she does not drink alcohol and does not use drugs. Her husband's name is Scientist, research (medical). She is a retired [employer]ar[employer] manager. She lives in [place]. The patient is alone today.   Allergies:  Allergies  Allergen Reactions  . Oxycodone Nausea Only  . Sulfa Antibiotics Rash    Childhood reaction  . Sulfonamide Derivatives Rash    Childhood reaction.    Current Medications: Current Outpatient Medications  Medication Sig Dispense Refill  . Alcohol Swabs (B-D SINGLE USE SWABS REGULAR) PADS Use to check blood sugar once daily 100 each 3  . atorvastatin (LIPITOR) 40 MG tablet TAKE 1 TABLET AT BEDTIME 90 tablet 2  . Blood Glucose Calibration (ACCU-CHEK AVIVA) SOLN Use to check blood sugar once daily 1 each 0  . Blood Glucose Calibration (TRUE METRIX LEVEL  1) Low SOLN Use as directed 1 each 3  . Blood Glucose Monitoring Suppl (TRUE METRIX AIR GLUCOSE METER) w/Device KIT Use to check blood sugar daily 1 kit 0  . Calcium Carbonate-Vitamin D 600-400 MG-UNIT tablet Take 1 tablet by mouth 2 (two) times daily.    . diclofenac Sodium (VOLTAREN) 1 % GEL     . esomeprazole (NEXIUM) 40 MG capsule TAKE 1 CAPSULE 2 (TWO) TIMES DAILY BEFORE A MEAL. 180 capsule 1  . FLUoxetine (PROZAC) 20 MG capsule TAKE 1 CAPSULE EVERY DAY 90 capsule 1  . fluticasone (FLONASE) 50 MCG/ACT nasal spray USE 2 SPRAYS INTO THE NOSE DAILY AS DIRECTED (SUBSTITUTED FOR FLONASE) 48 g 3  . furosemide (LASIX) 20 MG tablet TAKE 1 TABLET BY MOUTH EVERY DAY AS NEEDED 30 tablet 5  .  gabapentin (NEURONTIN) 100 MG capsule TAKE 1 CAPSULE IN THE MORNING, 1 CAPSULE AT LUNCH, 1 CAPSULES AT DINNER AND 2 TO 3 CAPSULES AT BEDTIME 540 capsule 5  . Glucosamine 500 MG TABS Take 500 mg by mouth daily.    Marland Kitchen glucose blood (TRUE METRIX BLOOD GLUCOSE TEST) test strip Use to check blood sugar daily 100 each 3  . isosorbide mononitrate (IMDUR) 30 MG 24 hr tablet TAKE 1 TABLET EVERY DAY 90 tablet 3  . KLOR-CON M20 20 MEQ tablet Take 1 tablet by mouth daily.    Marland Kitchen latanoprost (XALATAN) 0.005 % ophthalmic solution Place 1 drop into both eyes at bedtime.    . lidocaine (LIDODERM) 5 % APPLY 1 PATCH ONTO THE SKIN EVERY DAY. REMOVE AND DISCARD PATCH WITHIN 12 HOURS OR AS DIRECTED BY PHYSICIAN. 90 patch 3  . loratadine (CLARITIN) 10 MG tablet Take 10 mg by mouth daily.    Marland Kitchen losartan (COZAAR) 50 MG tablet Take 1 tablet (50 mg total) by mouth daily. (Patient taking differently: Take 100 mg by mouth daily.) 90 tablet 3  . meloxicam (MOBIC) 15 MG tablet TAKE 1 TABLET BY MOUTH EVERY DAY 30 tablet 1  . Omega-3 Fatty Acids (FISH OIL) 1000 MG CAPS Take 1,000 mg by mouth daily.     . polycarbophil (FIBERCON) 625 MG tablet Take 625 mg by mouth daily.    . tamoxifen (NOLVADEX) 20 MG tablet TAKE 1 TABLET EVERY DAY 90 tablet 3  .  tolterodine (DETROL LA) 4 MG 24 hr capsule TAKE 1 CAPSULE BY MOUTH EVERY DAY 90 capsule 3  . traMADol (ULTRAM) 50 MG tablet TAKE 1 TABLET EVERY 6 HOURS AS NEEDED FOR PAIN 90 tablet 0  . triamcinolone cream (KENALOG) 0.5 % Apply 1 application topically 2 (two) times daily. 30 g 0  . TRUEplus Lancets 28G MISC Use to check blood sugar daily 100 each 3   No current facility-administered medications for this visit.    Review of Systems  Constitutional: Positive for weight loss (6 lbs). Negative for chills, diaphoresis, fever and malaise/fatigue.  HENT: Negative.  Negative for congestion, ear discharge, ear pain, hearing loss, nosebleeds, sinus pain, sore throat and tinnitus.   Eyes: Negative.  Negative for blurred vision.  Respiratory: Positive for shortness of breath (occasional). Negative for cough, hemoptysis and sputum production.   Cardiovascular: Negative.  Negative for chest pain, palpitations and leg swelling.  Gastrointestinal: Negative for abdominal pain, blood in stool, constipation, diarrhea, heartburn, melena, nausea and vomiting.       Diet is good.  Genitourinary: Negative.  Negative for dysuria, frequency, hematuria and urgency.  Musculoskeletal: Positive for joint pain (knees). Negative for back pain, myalgias and neck pain.  Skin: Negative.  Negative for itching and rash.  Neurological: Negative.  Negative for dizziness, tingling, sensory change, weakness and headaches.  Endo/Heme/Allergies: Negative.  Does not bruise/bleed easily.  Psychiatric/Behavioral: Negative.  Negative for depression and memory loss. The patient is not nervous/anxious and does not have insomnia.   All other systems reviewed and are negative.  Performance status (ECOG): 1  Vital Signs Blood pressure (!) 178/88, pulse 76, temperature 98.2 F (36.8 C), temperature source Tympanic, resp. rate 16, weight 136 lb 5.7 oz (61.8 kg), SpO2 100 %.  Physical Exam Vitals and nursing note reviewed.   Constitutional:      General: She is not in acute distress.    Appearance: Normal appearance. She is well-developed. She is not diaphoretic.  HENT:  Head: Normocephalic and atraumatic.     Comments: Curly gray hair.    Mouth/Throat:     Mouth: Mucous membranes are moist.  Eyes:     General: No scleral icterus.    Conjunctiva/sclera: Conjunctivae normal.     Pupils: Pupils are equal, round, and reactive to light.     Comments: Glasses.  Blue eyes.  Cardiovascular:     Rate and Rhythm: Normal rate and regular rhythm.     Pulses: Normal pulses.     Heart sounds: Normal heart sounds.  Pulmonary:     Effort: Pulmonary effort is normal.     Breath sounds: Normal breath sounds.  Chest:  Breasts:     Right: Skin change (fibrocystic changes inferiorly and in upper quadrant) and tenderness (mild) present. No swelling, mass, nipple discharge, axillary adenopathy or supraclavicular adenopathy.     Left: Absent. No swelling, tenderness, axillary adenopathy or supraclavicular adenopathy.    Abdominal:     Palpations: There is no hepatomegaly, splenomegaly or mass.     Tenderness: There is no abdominal tenderness.  Musculoskeletal:     Right lower leg: No edema.     Left lower leg: No edema.  Lymphadenopathy:     Head:     Right side of head: No preauricular, posterior auricular or occipital adenopathy.     Left side of head: No preauricular, posterior auricular or occipital adenopathy.     Cervical: No cervical adenopathy.     Upper Body:     Right upper body: No supraclavicular or axillary adenopathy.     Left upper body: No supraclavicular or axillary adenopathy.     Lower Body: No right inguinal adenopathy. No left inguinal adenopathy.  Neurological:     Mental Status: She is alert and oriented to person, place, and time.  Psychiatric:        Mood and Affect: Mood and affect normal.        Behavior: Behavior normal.        Thought Content: Thought content normal.         Judgment: Judgment normal.    Appointment on 03/08/2020  Component Date Value Ref Range Status  . Sodium 03/08/2020 135  135 - 145 mmol/L Final  . Potassium 03/08/2020 3.6  3.5 - 5.1 mmol/L Final  . Chloride 03/08/2020 101  98 - 111 mmol/L Final  . CO2 03/08/2020 24  22 - 32 mmol/L Final  . Glucose, Bld 03/08/2020 120* 70 - 99 mg/dL Final   Glucose reference range applies only to samples taken after fasting for at least 8 hours.  . BUN 03/08/2020 13  8 - 23 mg/dL Final  . Creatinine, Ser 03/08/2020 0.80  0.44 - 1.00 mg/dL Final  . Calcium 03/08/2020 9.0  8.9 - 10.3 mg/dL Final  . Total Protein 03/08/2020 6.6  6.5 - 8.1 g/dL Final  . Albumin 03/08/2020 3.9  3.5 - 5.0 g/dL Final  . AST 03/08/2020 15  15 - 41 U/L Final  . ALT 03/08/2020 14  0 - 44 U/L Final  . Alkaline Phosphatase 03/08/2020 43  38 - 126 U/L Final  . Total Bilirubin 03/08/2020 0.4  0.3 - 1.2 mg/dL Final  . GFR, Estimated 03/08/2020 >60  >60 mL/min Final   Comment: (NOTE) Calculated using the CKD-EPI Creatinine Equation (2021)   . Anion gap 03/08/2020 10  5 - 15 Final   Performed at [facility], [address] [place], [place] [address]  . WBC  03/08/2020 6.7  4.0 - 10.5 K/uL Final  . RBC 03/08/2020 4.21  3.87 - 5.11 MIL/uL Final  . Hemoglobin 03/08/2020 11.2* 12.0 - 15.0 g/dL Final  . HCT 03/08/2020 36.3  36.0 - 46.0 % Final  . MCV 03/08/2020 86.2  80.0 - 100.0 fL Final  . MCH 03/08/2020 26.6  26.0 - 34.0 pg Final  . MCHC 03/08/2020 30.9  30.0 - 36.0 g/dL Final  . RDW 03/08/2020 16.2* 11.5 - 15.5 % Final  . Platelets 03/08/2020 173  150 - 400 K/uL Final  . nRBC 03/08/2020 0.0  0.0 - 0.2 % Final  . Neutrophils Relative % 03/08/2020 67  % Final  . Neutro Abs 03/08/2020 4.5  1.7 - 7.7 K/uL Final  . Lymphocytes Relative 03/08/2020 22  % Final  . Lymphs Abs 03/08/2020 1.5  0.7 - 4.0 K/uL Final  . Monocytes Relative 03/08/2020 9  % Final  . Monocytes Absolute 03/08/2020 0.6  0.1 - 1.0 K/uL Final  .  Eosinophils Relative 03/08/2020 1  % Final  . Eosinophils Absolute 03/08/2020 0.1  0.0 - 0.5 K/uL Final  . Basophils Relative 03/08/2020 1  % Final  . Basophils Absolute 03/08/2020 0.1  0.0 - 0.1 K/uL Final  . Immature Granulocytes 03/08/2020 0  % Final  . Abs Immature Granulocytes 03/08/2020 0.02  0.00 - 0.07 K/uL Final   Performed at [facility], [address] [place], [place] [address]    Assessment:  Ashley Ryan is a 75 y.o. female withleft breast DCISs/p simple mastectomy on 06/13/2017. Pathology revealed at least 8 mm of grade III DCIS. Margins were negative. One sentinel lymph node was negative. DCIS was ER was + (>90%) and PR + (75%). Pathologic stagewas Tis N0.  She began tamoxifen on 07/03/2017.  Diagnostic left mammogramon 05/14/2017 revealed grouped pleomorphic calcifications within the lower outer quadrant of the LEFT breast, spanning 7 mm.  Right screening mammogram on 08/03/2018 revealed no evidence of malignancy.  Right screening mammogram on 08/04/2019 revealed no mammographic evidence of malignancy.   Bone densityon 11/22/2014 revealed osteopeniawith a T-score of -2.4 in the AP spine L1-L4. Bone densityon 06/02/2017 revealed osteopenia with a T-score of -2.1 in the AP spine L1-L4 and -1.9 in the right femoral neck.  Bone density on 08/04/2019 revealed osteopenia with a T score of -2.1 in the right femoral neck.   She a history of iron deficiency anemia. She received a blood transfusion 6 years ago. She took oral ironx 2 years. Labs on 06/02/2017 revealed a ferritin 43 with normal iron studies.    Ferritin has been followed: 5.3 on 08/10/2013, 48.2 on 05/14/2016, 43 on 06/02/2017, 35 on 10/31/2017, 39 on 07/29/2018, .  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 EGD on 06/04/2019 revealed a medium-sized hiatal hernia, gastritis, and one duodenal polyp  (peptic duodenitis).  Pathology revealed iron pill gastritis and no H pylori, metaplasia, dysplasia or malignancy.  Colonoscopyon 09/09/2013 revealed an 8 mm sessile polyp in the cecum (tubular adenoma).  Colonoscopy on 06/04/2019 revealed two 6 to 8 mm polyps in the transverse colon (tubular adenomas), and one 6 mm polyp in the rectum (tubular adenoma).  Clip (MR conditional) was placed.  There was one 3 mm polyp in the ascending colon (tubular adenoma).  There were non-bleeding internal hemorrhoids and diverticulosis in the sigmoid colon.   She has B12 deficiency.  She receives B12 monthly (last 02/09/2020).  Folate  was 8.3 on 08/10/2019.  Symptomatically, she feels "ok".  She performs monthly breast self exams. Exam is stable.  Plan: 1.   Labs today: CBC with diff, CMP, ferritin, iron studies. 2.   Left breast DCIS Clinically, she is doing well.  Exam reveals no evidence of recurrent disease       Right screening mammogram on 08/04/2019 revealed no evidence of disease.         Continue tamoxifen (began 07/04/2007). 3.   Osteopenia Bone density study on 08/04/2019 revealed a T score of -2.1.  Continue calcium and vitamin D. 4.   B12 deficiency Patient receives B12 monthly (last 02/09/2020).  B12 today and monthly x6.  Check folate annually. 5. Iron deficiency Hematocrit 36.3.  Hemoglobin 11.2.  MCV 86.2 on 03/08/2020.                Ferritin 35 with an iron saturation of 12% and a TIBC of 322.    She is on oral iron.  EGD revealed gastritis and duodenitis.  She has iron pill gastritis.   Patient to receive IV iron as needed. 6.   B12 today and monthly x 6. 7.   Mammogram on 08/03/2020. 8.   RTC in 6 months for MD assessment, labs (CBC with diff, CMP, ferritin, iron studies), review of mammogram, and B12.  I discussed the assessment and treatment plan with the patient.  The patient was provided an opportunity to ask questions and all were  answered.  The patient agreed with the plan and demonstrated an understanding of the instructions.  The patient was advised to call back if the symptoms worsen or if the condition fails to improve as anticipated.   Lequita Asal, MD, PhD    03/08/2020, 2:07 PM  I, Mirian Mo Tufford, am acting as a Education administrator for [employer]. Mike Gip, MD.   I, Byard Carranza C. Mike Gip, MD, have reviewed the above documentation for accuracy and completeness, and I agree with the above.

## 2020-03-08 ENCOUNTER — Inpatient Hospital Stay (HOSPITAL_BASED_OUTPATIENT_CLINIC_OR_DEPARTMENT_OTHER): Payer: Medicare HMO | Admitting: Hematology and Oncology

## 2020-03-08 ENCOUNTER — Inpatient Hospital Stay: Payer: Medicare HMO | Attending: Hematology and Oncology

## 2020-03-08 ENCOUNTER — Inpatient Hospital Stay: Payer: Medicare HMO

## 2020-03-08 ENCOUNTER — Other Ambulatory Visit: Payer: Self-pay

## 2020-03-08 VITALS — BP 178/88 | HR 76 | Temp 98.2°F | Resp 16 | Wt 136.4 lb

## 2020-03-08 DIAGNOSIS — M8588 Other specified disorders of bone density and structure, other site: Secondary | ICD-10-CM | POA: Diagnosis not present

## 2020-03-08 DIAGNOSIS — D509 Iron deficiency anemia, unspecified: Secondary | ICD-10-CM

## 2020-03-08 DIAGNOSIS — M8580 Other specified disorders of bone density and structure, unspecified site: Secondary | ICD-10-CM | POA: Insufficient documentation

## 2020-03-08 DIAGNOSIS — E538 Deficiency of other specified B group vitamins: Secondary | ICD-10-CM | POA: Insufficient documentation

## 2020-03-08 DIAGNOSIS — Z79810 Long term (current) use of selective estrogen receptor modulators (SERMs): Secondary | ICD-10-CM | POA: Diagnosis not present

## 2020-03-08 DIAGNOSIS — D0512 Intraductal carcinoma in situ of left breast: Secondary | ICD-10-CM | POA: Diagnosis not present

## 2020-03-08 LAB — IRON AND TIBC
Iron: 37 ug/dL (ref 28–170)
Saturation Ratios: 12 % (ref 10.4–31.8)
TIBC: 322 ug/dL (ref 250–450)
UIBC: 285 ug/dL

## 2020-03-08 LAB — CBC WITH DIFFERENTIAL/PLATELET
Abs Immature Granulocytes: 0.02 10*3/uL (ref 0.00–0.07)
Basophils Absolute: 0.1 10*3/uL (ref 0.0–0.1)
Basophils Relative: 1 %
Eosinophils Absolute: 0.1 10*3/uL (ref 0.0–0.5)
Eosinophils Relative: 1 %
HCT: 36.3 % (ref 36.0–46.0)
Hemoglobin: 11.2 g/dL — ABNORMAL LOW (ref 12.0–15.0)
Immature Granulocytes: 0 %
Lymphocytes Relative: 22 %
Lymphs Abs: 1.5 10*3/uL (ref 0.7–4.0)
MCH: 26.6 pg (ref 26.0–34.0)
MCHC: 30.9 g/dL (ref 30.0–36.0)
MCV: 86.2 fL (ref 80.0–100.0)
Monocytes Absolute: 0.6 10*3/uL (ref 0.1–1.0)
Monocytes Relative: 9 %
Neutro Abs: 4.5 10*3/uL (ref 1.7–7.7)
Neutrophils Relative %: 67 %
Platelets: 173 10*3/uL (ref 150–400)
RBC: 4.21 MIL/uL (ref 3.87–5.11)
RDW: 16.2 % — ABNORMAL HIGH (ref 11.5–15.5)
WBC: 6.7 10*3/uL (ref 4.0–10.5)
nRBC: 0 % (ref 0.0–0.2)

## 2020-03-08 LAB — COMPREHENSIVE METABOLIC PANEL
ALT: 14 U/L (ref 0–44)
AST: 15 U/L (ref 15–41)
Albumin: 3.9 g/dL (ref 3.5–5.0)
Alkaline Phosphatase: 43 U/L (ref 38–126)
Anion gap: 10 (ref 5–15)
BUN: 13 mg/dL (ref 8–23)
CO2: 24 mmol/L (ref 22–32)
Calcium: 9 mg/dL (ref 8.9–10.3)
Chloride: 101 mmol/L (ref 98–111)
Creatinine, Ser: 0.8 mg/dL (ref 0.44–1.00)
GFR, Estimated: >60 mL/min (ref >60)
Glucose, Bld: 120 mg/dL — ABNORMAL HIGH (ref 70–99)
Potassium: 3.6 mmol/L (ref 3.5–5.1)
Sodium: 135 mmol/L (ref 135–145)
Total Bilirubin: 0.4 mg/dL (ref 0.3–1.2)
Total Protein: 6.6 g/dL (ref 6.5–8.1)

## 2020-03-08 LAB — FERRITIN: Ferritin: 35 ng/mL (ref 11–307)

## 2020-03-08 MED ORDER — CYANOCOBALAMIN 1000 MCG/ML IJ SOLN
1000.0000 ug | Freq: Once | INTRAMUSCULAR | Status: AC
  Administered 2020-03-08: 1000 ug via INTRAMUSCULAR
  Filled 2020-03-08: qty 1

## 2020-03-08 NOTE — Progress Notes (Signed)
Patient reports occasional SOB and a headache today.  She keeps BP record at home: 164/84 at home today. She had a telephone visit with her PCP yesterday about her BP meds.

## 2020-03-23 ENCOUNTER — Other Ambulatory Visit: Payer: Self-pay | Admitting: Family Medicine

## 2020-03-26 ENCOUNTER — Other Ambulatory Visit: Payer: Self-pay | Admitting: Podiatry

## 2020-03-26 NOTE — Telephone Encounter (Signed)
Please advise 

## 2020-03-28 ENCOUNTER — Telehealth: Payer: Self-pay | Admitting: *Deleted

## 2020-03-28 NOTE — Telephone Encounter (Signed)
Patient called stating that she is not feeling well. Patient stated that she stated last week with some sinus and allergies symptoms. Patient stated her symptoms got worse starting Friday. Patient stated she has had a headache, dry cough, head congestion and is real tired. Patient denies a fever, SOB or difficulty breathing. Patient scheduled for a virtual visit with Dr. Lorelei Pont tomorrow 03/29/20 at 12:00.  Patient stated that her blood pressure has been a little high recently before taking her blood pressure medication in the mornings. Patient stated her blood pressure yesterday morning was 178/80 before she took her medication. Patient stated after taking her medication it came down to 140/70. Patient was advised to continue to monitor her blood pressure. Patient stated when she gets home she will check her blood pressure and if it is elevated she will call the office back. Patient was given ER precautions and she verbalized understanding.

## 2020-03-28 NOTE — Telephone Encounter (Signed)
Noted  

## 2020-03-29 ENCOUNTER — Encounter: Payer: Self-pay | Admitting: Family Medicine

## 2020-03-29 ENCOUNTER — Telehealth (INDEPENDENT_AMBULATORY_CARE_PROVIDER_SITE_OTHER): Payer: Medicare HMO | Admitting: Family Medicine

## 2020-03-29 VITALS — BP 145/82 | HR 82 | Temp 98.6°F | Ht 60.0 in | Wt 135.0 lb

## 2020-03-29 DIAGNOSIS — E1159 Type 2 diabetes mellitus with other circulatory complications: Secondary | ICD-10-CM | POA: Diagnosis not present

## 2020-03-29 DIAGNOSIS — J3489 Other specified disorders of nose and nasal sinuses: Secondary | ICD-10-CM | POA: Diagnosis not present

## 2020-03-29 DIAGNOSIS — J301 Allergic rhinitis due to pollen: Secondary | ICD-10-CM | POA: Diagnosis not present

## 2020-03-29 DIAGNOSIS — I152 Hypertension secondary to endocrine disorders: Secondary | ICD-10-CM | POA: Diagnosis not present

## 2020-03-29 MED ORDER — AMOXICILLIN 875 MG PO TABS: 875.0000 mg | ORAL_TABLET | Freq: Two times a day (BID) | ORAL | 0 refills | Status: AC

## 2020-03-29 NOTE — Progress Notes (Signed)
Ashley Olheiser T. Kumar Falwell, MD Primary Care and Sports Medicine [facility] [place], [address] Phone: [phone]  FAX: [phone]  Ashley Ryan - 75 y.o. female  MRN [id number]  Date of Birth: 1945/06/28  Visit Date: 03/29/2020  PCP: [name], MD  Referred by: [name], MD  Virtual Visit via Video Note:  I connected with  [name] on 03/29/2020 12:00 PM EST by a video enabled telemedicine application and verified that I am speaking with the correct person using two identifiers.   Location patient: home computer, tablet, or smartphone Location provider: work or home office Consent: Verbal consent directly obtained from [employer]. Persons participating in the virtual visit: patient, provider  I discussed the limitations of evaluation and management by telemedicine and the availability of in person appointments. The patient expressed understanding and agreed to proceed.  Chief Complaint  Patient presents with  . Headache  . Nasal Congestion  . Hypertension    History of Present Illness:  She presents with a few different questions and concerns.  She has not been feeling all that well over the past couple of weeks and she does have some pressure in the frontal sinus region.  She also does have some nasal congestion, and she does have some known allergic rhinitis.  She has tried some Mucinex D.  At the same time, she also has noticed that her blood pressure has increased.  Readings are as below.  Currently she is taking losartan 50 mg daily for her blood pressure. She also does have congestive heart failure  Mucinex-D  166/75 this AM 149/89 after pills 145/82  HA started about 2 weeks ago  Losartan - increase to 100 mg and write BP daily  Review of Systems as above: See pertinent positives and pertinent negatives per HPI No acute distress verbally   Observations/Objective/Exam:  An  attempt was made to discern vital signs over the phone and per patient if applicable and possible.   General:    Alert, Oriented, appears well and in no acute distress  Pulmonary:     On inspection no signs of respiratory distress.  Psych / Neurological:     Pleasant and cooperative.  Assessment and Plan:    ICD-10-CM   1. Frontal sinus pain  J34.89   2. 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 Seasonal allergic rhinitis due to pollen  J30.1    Sinus pain, very well could be superimposed sinusitis on top of allergic rhinitis.  I am getting give the patient some amoxicillin and I recommended that she continue with her nasal sprays, antihistamines, and she can use Mucinex but I told her to stop taking the deep form.  Pseudoephedrine is likely causing some acute elevation of blood pressure.  It sounds as if she does have some chronic increase in blood pressure, so I am going to have her double her losartan.  I discussed the assessment and treatment plan with the patient. The patient was provided an opportunity to ask questions and all were answered. The patient agreed with the plan and demonstrated an understanding of the instructions.   The patient was advised to call back or seek an in-person evaluation if the symptoms worsen or if the condition fails to improve as anticipated.  Follow-up: prn unless noted otherwise below No follow-ups on file.  Meds ordered this encounter  Medications  .  amoxicillin (AMOXIL) 875 MG tablet    Sig: Take 1 tablet (875 mg total) by mouth 2 (two) times daily for 10 days.    Dispense:  20 tablet    Refill:  0   No orders of the defined types were placed in this encounter.   Signed,  [name]. Khylee Algeo, MD

## 2020-04-05 ENCOUNTER — Inpatient Hospital Stay: Payer: Medicare HMO | Attending: Hematology and Oncology

## 2020-04-05 ENCOUNTER — Other Ambulatory Visit: Payer: Self-pay

## 2020-04-05 DIAGNOSIS — E538 Deficiency of other specified B group vitamins: Secondary | ICD-10-CM | POA: Diagnosis not present

## 2020-04-05 MED ORDER — CYANOCOBALAMIN 1000 MCG/ML IJ SOLN
1000.0000 ug | Freq: Once | INTRAMUSCULAR | Status: AC
  Administered 2020-04-05: 1000 ug via INTRAMUSCULAR
  Filled 2020-04-05: qty 1

## 2020-04-12 ENCOUNTER — Encounter: Payer: Self-pay | Admitting: Internal Medicine

## 2020-04-12 ENCOUNTER — Other Ambulatory Visit
Admission: RE | Admit: 2020-04-12 | Discharge: 2020-04-12 | Disposition: A | Discharge status: Home / Self Care | Payer: Medicare HMO | Source: Ambulatory Visit | Attending: Internal Medicine | Admitting: Internal Medicine

## 2020-04-12 ENCOUNTER — Ambulatory Visit (INDEPENDENT_AMBULATORY_CARE_PROVIDER_SITE_OTHER): Payer: Medicare HMO | Admitting: Internal Medicine

## 2020-04-12 ENCOUNTER — Other Ambulatory Visit: Payer: Self-pay

## 2020-04-12 VITALS — BP 120/70 | HR 85 | Ht 59.0 in | Wt 131.5 lb

## 2020-04-12 DIAGNOSIS — E785 Hyperlipidemia, unspecified: Secondary | ICD-10-CM

## 2020-04-12 DIAGNOSIS — I10 Essential (primary) hypertension: Secondary | ICD-10-CM

## 2020-04-12 DIAGNOSIS — I5032 Chronic diastolic (congestive) heart failure: Secondary | ICD-10-CM | POA: Diagnosis not present

## 2020-04-12 DIAGNOSIS — I208 Other forms of angina pectoris: Secondary | ICD-10-CM | POA: Diagnosis not present

## 2020-04-12 DIAGNOSIS — E1169 Type 2 diabetes mellitus with other specified complication: Secondary | ICD-10-CM

## 2020-04-12 LAB — BASIC METABOLIC PANEL
Anion gap: 9 (ref 5–15)
BUN: 14 mg/dL (ref 8–23)
CO2: 25 mmol/L (ref 22–32)
Calcium: 8.9 mg/dL (ref 8.9–10.3)
Chloride: 102 mmol/L (ref 98–111)
Creatinine, Ser: 0.64 mg/dL (ref 0.44–1.00)
GFR, Estimated: >60 mL/min (ref >60)
Glucose, Bld: 94 mg/dL (ref 70–99)
Potassium: 3.6 mmol/L (ref 3.5–5.1)
Sodium: 136 mmol/L (ref 135–145)

## 2020-04-12 NOTE — Patient Instructions (Signed)
Medication Instructions:  Your physician recommends that you continue on your current medications as directed. Please refer to the Current Medication list given to you today.  *If you need a refill on your cardiac medications before your next appointment, please call your pharmacy*   Lab Work: Your physician recommends that you return for lab work in: [place]. - Please go to the [facility]. You will check in at the front desk to the right as you walk into the atrium. Valet Parking is offered if needed. - No appointment needed. You may go any day between 7 am and 6 pm.  If you have labs (blood work) drawn today and your tests are completely normal, you will receive your results only by: Marland Kitchen MyChart Message (if you have MyChart) OR . A paper copy in the mail If you have any lab test that is abnormal or we need to change your treatment, we will call you to review the results.   Testing/Procedures: none  Follow-Up: At [facility], you and your health needs are our priority.  As part of our continuing mission to provide you with exceptional heart care, we have created designated Provider Care Teams.  These Care Teams include your primary Cardiologist (physician) and Advanced Practice Providers (APPs -  Physician Assistants and Nurse Practitioners) who all work together to provide you with the care you need, when you need it.  We recommend signing up for the patient portal called "MyChart".  Sign up information is provided on this After Visit Summary.  MyChart is used to connect with patients for Virtual Visits (Telemedicine).  Patients are able to view lab/test results, encounter notes, upcoming appointments, etc.  Non-urgent messages can be sent to your provider as well.   To learn more about what you can do with MyChart, go to [email or web address].    Your next appointment:   12 month(s)  The format for your next appointment:   In Person  Provider:   You may see  Nelva Bush, MD or one of the following Advanced Practice Providers on your designated Care Team:    Murray Hodgkins, NP  Christell Faith, PA-C  Marrianne Mood, PA-C  [place], [place]  Laurann Montana, NP

## 2020-04-12 NOTE — Progress Notes (Signed)
Follow-up Outpatient Visit Date: 04/12/2020  Primary Care Provider: [name], MD [place] [id number]  Chief Complaint: Stressed out  HPI:  Ashley Ryan is a 75 y.o. female with history of nonobstructive coronary artery disease, HFpEF, mild pulmonary hypertension (RVSP 35-40% by echo in 10/2017), systemic hypertension, hiatal hernia, prediabetes, anemia, and arthritis, who presents for follow-up of stable angina and HFpEF.  I last saw her in 09/2019, at which time she was doing relatively well.  She reported occasional fatigue, similar to previous visits.  We deferred medication changes and additional testing.  Today, Ashley Ryan reports that she has been feeling relatively well though she notes some increasing stress at home that she attributes to her husband.  She has stable exertional dyspnea and denies chest pain, palpitations, lightheadedness, and edema.  She has not needed any furosemide for a long time.  She recently had a sinus infection treated with amoxicillin.  Blood pressures were running high at that time, prompting Dr. Edilia Bo to increase losartan-HCTZ to 100/25 mg daily.  She has been tolerating this well with improvement in her blood pressure.  --------------------------------------------------------------------------------------------------  Past Medical History:  Diagnosis Date  . Anemia   . Arthritis   . 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  Mastectomy/ SLN.  Marland Kitchen Chronic headache   . Complication of anesthesia    prior to 1991 used to have PONV.  none recently.  . Diverticulosis of colon   . Family history of adverse reaction to anesthesia    mother/daighter get sick  . GERD with stricture   . Hard of hearing   . Heart murmur    followed by PCP  . Hiatal hernia   . Hypertension   . Neuropathy    feet  . Osteoporosis   . Pre-diabetes   . Wears dentures    full upper   Past Surgical History:   Procedure Laterality Date  . ABDOMINAL HYSTERECTOMY  1978   one ovary remains  . 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 CARDIAC CATHETERIZATION     "yrs ago" all OK.  Marland Kitchen CATARACT EXTRACTION W/PHACO Left 02/21/2016   Procedure: CATARACT EXTRACTION PHACO AND INTRAOCULAR LENS PLACEMENT ([place])  Left eye;  Surgeon: Leandrew Koyanagi, MD;  Location: [place];  Service: Ophthalmology;  Laterality: Left;  Left eye Diabetic  . CATARACT EXTRACTION W/PHACO Right 03/13/2016   Procedure: CATARACT EXTRACTION PHACO AND INTRAOCULAR LENS PLACEMENT ([place])  right  diabetic;  Surgeon: Leandrew Koyanagi, MD;  Location: [place];  Service: Ophthalmology;  Laterality: Right;  diabetic  . CHOLECYSTECTOMY  1978  . COLONOSCOPY W/ POLYPECTOMY  09/09/2013   8 mm tubular adenoma of the cecum.  Erskine Emery, MD. [place] [facility]  . COLONOSCOPY WITH PROPOFOL N/A 06/04/2019   Procedure: COLONOSCOPY WITH PROPOFOL;  Surgeon: Lucilla Lame, MD;  Location[facility] ENDOSCOPY;  Service: Endoscopy;  Laterality: N/A;  . ESOPHAGOGASTRODUODENOSCOPY (EGD) WITH PROPOFOL N/A 06/04/2019   Procedure: ESOPHAGOGASTRODUODENOSCOPY (EGD) WITH PROPOFOL;  Surgeon: Lucilla Lame, MD;  Location: [facility] ENDOSCOPY;  Service: Endoscopy;  Laterality: N/A;  . JOINT REPLACEMENT Left    knee   . LEFT HEART CATH AND CORONARY ANGIOGRAPHY N/A 11/06/2017   Procedure: LEFT HEART CATH AND CORONARY ANGIOGRAPHY;  Surgeon: Nelva Bush, MD;  Location: [place] CV LAB;  Service: Cardiovascular;  Laterality: N/A;  .  MASTECTOMY Left 06/13/2017   mastectomy neg margins  . OOPHORECTOMY     one still left  . RENAL ANGIOGRAPHY  11/06/2017   Procedure: RENAL ANGIOGRAPHY;  Surgeon: Nelva Bush, MD;  Location: [place] CV [facility];  Service: Cardiovascular;;  . SENTINEL NODE BIOPSY Left 06/13/2017   Procedure: SENTINEL NODE BIOPSY;   Surgeon: Robert Bellow, MD;  Location: [facility] ORS;  Service: General;  Laterality: Left;  . 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  Surgeon: Robert Bellow, MD;  Location: [facility] ORS;  Service: General;  Laterality: Left;     Current Meds  Medication Sig  . Alcohol Swabs (B-D SINGLE USE SWABS REGULAR) PADS Use to check blood sugar once daily  . atorvastatin (LIPITOR) 40 MG tablet TAKE 1 TABLET AT BEDTIME  . Blood Glucose Calibration (ACCU-CHEK AVIVA) SOLN Use to check blood sugar once daily  . Blood Glucose Calibration (TRUE METRIX LEVEL 1) Low SOLN Use as directed  . Blood Glucose Monitoring Suppl (TRUE METRIX AIR GLUCOSE METER) w/Device KIT Use to check blood sugar daily  . Calcium Carbonate-Vitamin D 600-400 MG-UNIT tablet Take 1 tablet by mouth 2 (two) times daily.  . diclofenac Sodium (VOLTAREN) 1 % GEL   . esomeprazole (NEXIUM) 40 MG capsule TAKE 1 CAPSULE 2 (TWO) TIMES DAILY BEFORE A MEAL.  Marland Kitchen FLUoxetine (PROZAC) 20 MG capsule TAKE 1 CAPSULE EVERY DAY  . fluticasone (FLONASE) 50 MCG/ACT nasal spray USE 2 SPRAYS INTO THE NOSE DAILY AS DIRECTED (SUBSTITUTED FOR FLONASE)  . furosemide (LASIX) 20 MG tablet TAKE 1 TABLET BY MOUTH EVERY DAY AS NEEDED  . gabapentin (NEURONTIN) 100 MG capsule TAKE 1 CAPSULE IN THE MORNING, 1 CAPSULE AT LUNCH, 1 CAPSULES AT DINNER AND 2 TO 3 CAPSULES AT BEDTIME  . Glucosamine 500 MG TABS Take 500 mg by mouth daily.  Marland Kitchen glucose blood (TRUE METRIX BLOOD GLUCOSE TEST) test strip Use to check blood sugar daily  . isosorbide mononitrate (IMDUR) 30 MG 24 hr tablet TAKE 1 TABLET EVERY DAY  . KLOR-CON M20 20 MEQ tablet TAKE 1 TABLET BY MOUTH EVERY DAY  . latanoprost (XALATAN) 0.005 % ophthalmic solution Place 1 drop into both eyes at bedtime.  . lidocaine (LIDODERM) 5 % APPLY 1 PATCH ONTO THE SKIN EVERY DAY. REMOVE AND DISCARD PATCH WITHIN 12 HOURS OR AS DIRECTED BY PHYSICIAN.  Marland Kitchen loratadine  (CLARITIN) 10 MG tablet Take 10 mg by mouth daily.  Marland Kitchen losartan-hydrochlorothiazide (HYZAAR) 100-25 MG tablet Take 1 tablet by mouth daily.  . meloxicam (MOBIC) 15 MG tablet TAKE 1 TABLET BY MOUTH EVERY DAY  . Omega-3 Fatty Acids (FISH OIL) 1000 MG CAPS Take 1,000 mg by mouth daily.   . polycarbophil (FIBERCON) 625 MG tablet Take 625 mg by mouth daily.  . tamoxifen (NOLVADEX) 20 MG tablet TAKE 1 TABLET EVERY DAY  . tolterodine (DETROL LA) 4 MG 24 hr capsule TAKE 1 CAPSULE BY MOUTH EVERY DAY  . traMADol (ULTRAM) 50 MG tablet TAKE 1 TABLET EVERY 6 HOURS AS NEEDED FOR PAIN  . triamcinolone cream (KENALOG) 0.5 % Apply 1 application topically 2 (two) times daily.  . TRUEplus Lancets 28G MISC Use to check blood sugar daily    Allergies: Oxycodone, Sulfa antibiotics, and Sulfonamide derivatives  Social History   Tobacco Use  . Smoking status: Never Smoker  . Smokeless tobacco: Never Used  Vaping Use  . Vaping Use: Never used  Substance Use  Topics  . Alcohol use: No  . Drug use: No    Family History  Problem Relation Age of Onset  . Breast cancer Other   . Stomach cancer Other   . Diabetes Other   . Heart disease Other   . Breast cancer Sister 41  . Esophageal cancer Brother   . Hypertension Mother   . Heart attack Mother 11  . Hypertension Father   . Coronary artery disease Father 32       CABG  . Stroke Father     Review of Systems: A 12-system review of systems was performed and was negative except as noted in the HPI.  --------------------------------------------------------------------------------------------------  Physical Exam: BP 120/70 (BP Location: Left Arm, Patient Position: Sitting, Cuff Size: Normal)   Pulse 85   Ht _0  (1.499 m)   Wt 131 lb 8 oz (59.6 kg)   SpO2 98%   BMI 26.56 kg/m   General:  NAD. Neck: No JVD or HJR. Lungs: Clear to auscultation bilaterally without wheezes or crackles. Heart: Regular rate and rhythm without murmurs, rubs, or  gallops. Abdomen: Soft, nontender, nondistended. Extremities: No lower extremity edema.  EKG:  Normal sinus rhythm with nonspecific ST/T changes.  QRS voltage has improved considerably in the limb leads from 10/13/2019.  Otherwise, no significant change.  Lab Results  Component Value Date   WBC 6.7 03/08/2020   HGB 11.2 (L) 03/08/2020   HCT 36.3 03/08/2020   MCV 86.2 03/08/2020   PLT 173 03/08/2020    Lab Results  Component Value Date   NA 135 03/08/2020   K 3.6 03/08/2020   CL 101 03/08/2020   CO2 24 03/08/2020   BUN 13 03/08/2020   CREATININE 0.80 03/08/2020   GLUCOSE 120 (H) 03/08/2020   ALT 14 03/08/2020    Lab Results  Component Value Date   CHOL 117 12/23/2019   HDL 58.40 12/23/2019   LDLCALC 42 12/23/2019   LDLDIRECT 102.5 07/21/2009   TRIG 84.0 12/23/2019   CHOLHDL 2 12/23/2019    --------------------------------------------------------------------------------------------------  ASSESSMENT AND PLAN: Stable angina: No chest pain reported.  Chronic exertional dyspnea is stable.  Prior catheterization in 10/2017 was without obstructive epicardial CAD.  We will plan to continue current regimen of isosorbide mononitrate for possible vasospasm/microvascular dysfunction.  Chronic HFpEF: Stable exertional dyspnea reported.  Ashley Ryan appears euvolemic on exam.  Continue furosemide 20 mg daily as needed as well as blood pressure control with isosorbide mononitrate and losartan-HCTZ.  Hyperlipidemia associated with type 2 diabetes mellitus: Continue atorvastatin 40 mg daily with ongoing management per Dr. Diona Browner.  Hypertension: Blood pressure well controlled today with recent escalation of losartan-HCTZ.  I will check a BMP today to ensure that renal function and potassium are stable.  Follow-up: Return to clinic in 12 months.  Nelva Bush, MD 04/12/2020 8:13 AM

## 2020-04-19 NOTE — Addendum Note (Signed)
Addended by: Britt Bottom on: 04/19/2020 04:15 PM   Modules accepted: Orders

## 2020-05-03 ENCOUNTER — Other Ambulatory Visit: Payer: Self-pay

## 2020-05-03 ENCOUNTER — Inpatient Hospital Stay: Payer: Medicare HMO | Attending: Hematology and Oncology

## 2020-05-03 DIAGNOSIS — E538 Deficiency of other specified B group vitamins: Secondary | ICD-10-CM | POA: Diagnosis not present

## 2020-05-03 MED ORDER — CYANOCOBALAMIN 1000 MCG/ML IJ SOLN
1000.0000 ug | Freq: Once | INTRAMUSCULAR | Status: AC
  Administered 2020-05-03: 1000 ug via INTRAMUSCULAR
  Filled 2020-05-03: qty 1

## 2020-05-25 ENCOUNTER — Other Ambulatory Visit: Payer: Self-pay | Admitting: Family Medicine

## 2020-05-25 NOTE — Telephone Encounter (Addendum)
Last office visit 03/29/2020 (video) with Dr. Lorelei Pont for sinus pain, HTN associated with DM & Seasonal allergic rhinitis.  Last refilled 02/22/2020 for #90 with no refills .   Losartan-HCTZ 100-25 mg.  At [facility] on 01/28/20 note states given low potassium despite rarely needing lasix... will D/C combo losartan/HCTZ and change to losartan 50 mg alone.  At video visit with Dr. Lorelei Pont on 03/29/2020 looks like patient was instructed to increase Losartan to 100 mg.   At visit with cardiologist on 04/12/2020 note states blood pressures were running high at that time, prompting Dr. Edilia Bo to increase losartan-HCTZ to 100/25 mg daily.  But I only see where Dr. Lorelei Pont told her to increase her Losartan to 100 mg.    Please advise what patient is suppose to be taking.  I did speak with Ms. Jasso and the bottle she has at home is the combination pill.

## 2020-05-26 NOTE — Telephone Encounter (Signed)
Call   Was she taking the combination ( with the HCTZ ) when labs were done 04/12/20? If so we can continue combo as potassium is staying up.

## 2020-05-29 NOTE — Telephone Encounter (Signed)
Spoke with Ashley Ryan.  She states she believes she was taking the combination at the time her labs were drawn on 04/12/20.  She states she has plenty of the Losartan-HCTZ 100-25 mg so she does not need a refill at this time.  Tramadol refill pending.

## 2020-05-30 ENCOUNTER — Other Ambulatory Visit: Payer: Self-pay

## 2020-05-30 ENCOUNTER — Inpatient Hospital Stay: Payer: Medicare HMO | Attending: Hematology and Oncology

## 2020-05-30 DIAGNOSIS — E538 Deficiency of other specified B group vitamins: Secondary | ICD-10-CM | POA: Insufficient documentation

## 2020-05-30 MED ORDER — CYANOCOBALAMIN 1000 MCG/ML IJ SOLN
1000.0000 ug | Freq: Once | INTRAMUSCULAR | Status: AC
Start: 2020-05-30 — End: 2020-05-30
  Administered 2020-05-30: 1000 ug via INTRAMUSCULAR
  Filled 2020-05-30: qty 1

## 2020-06-05 ENCOUNTER — Telehealth: Payer: Self-pay

## 2020-06-05 NOTE — Telephone Encounter (Signed)
Received multiple faxes from a [facility] for topical medications. Called pt to find out if she requested them. She stated she thought it was through her insurance. She said to not worry about doing them. What she has is working just fine.

## 2020-06-07 ENCOUNTER — Inpatient Hospital Stay: Payer: Medicare HMO

## 2020-06-12 ENCOUNTER — Other Ambulatory Visit: Payer: Self-pay | Admitting: Family Medicine

## 2020-06-12 DIAGNOSIS — E1140 Type 2 diabetes mellitus with diabetic neuropathy, unspecified: Secondary | ICD-10-CM

## 2020-06-12 NOTE — Telephone Encounter (Signed)
Last office visit 03/29/2020 (video) with Dr. Lorelei Pont for frontal sinus pain.  Last refilled: Diclofenac listed as historical med.  Ok to refill?

## 2020-06-16 ENCOUNTER — Ambulatory Visit: Payer: Medicare HMO | Admitting: Family Medicine

## 2020-06-16 ENCOUNTER — Other Ambulatory Visit: Payer: Self-pay

## 2020-06-16 ENCOUNTER — Encounter: Payer: Self-pay | Admitting: Family Medicine

## 2020-06-16 VITALS — BP 132/70 | HR 84 | Temp 98.2°F | Ht 59.0 in | Wt 133.8 lb

## 2020-06-16 DIAGNOSIS — M79642 Pain in left hand: Secondary | ICD-10-CM

## 2020-06-16 DIAGNOSIS — E1140 Type 2 diabetes mellitus with diabetic neuropathy, unspecified: Secondary | ICD-10-CM

## 2020-06-16 LAB — URIC ACID: Uric Acid, Serum: 6.1 mg/dL (ref 2.4–7.0)

## 2020-06-16 MED ORDER — PREDNISONE 20 MG PO TABS: ORAL_TABLET | ORAL | 0 refills | Status: DC

## 2020-06-16 NOTE — Progress Notes (Signed)
Patient ID: Ashley Ryan, female    DOB: 1945/10/14, 75 y.o.   MRN: [id number]  This visit was conducted in person.  BP 132/70   Pulse 84   Temp 98.2 F (36.8 C) (Temporal)   Ht _0  (1.499 m)   Wt 133 lb 12 oz (60.7 kg)   SpO2 96%   BMI 27.01 kg/m    CC:  Chief Complaint  Patient presents with   Hand Pain    Left    Subjective:   HPI: Ashley Ryan is a 75 y.o. female presenting on 06/16/2020 for Hand Pain (Left)  Intermittent pain in left hand across the knuckles for several months but worse in last week. Sore, red and swollen over 2 nd and 3rd MCP joints.  No fall, no injury.   She does crochet.   She has tried  voltaren cream 2 times a day.  Tramadol and tylenol not helping    No past hand issues.  Wt Readings from Last 3 Encounters:  06/16/20 133 lb 12 oz (60.7 kg)  04/12/20 131 lb 8 oz (59.6 kg)  03/29/20 135 lb (61.2 kg)     DM  Lab Results  Component Value Date   HGBA1C 6.2 12/23/2019           Relevant past medical, surgical, family and social history reviewed and updated as indicated. Interim medical history since our last visit reviewed. Allergies and medications reviewed and updated. Outpatient Medications Prior to Visit  Medication Sig Dispense Refill   Alcohol Swabs (B-D SINGLE USE SWABS REGULAR) PADS USE  TO TEST BLOOD SUGAR ONE TIME DAILY 100 each 3   atorvastatin (LIPITOR) 40 MG tablet TAKE 1 TABLET AT BEDTIME 90 tablet 2   Blood Glucose Calibration (ACCU-CHEK AVIVA) SOLN Use to check blood sugar once daily 1 each 0   Blood Glucose Calibration (TRUE METRIX LEVEL 1) Low SOLN USE AS DIRECTED 1 each 3   Blood Glucose Monitoring Suppl (TRUE METRIX AIR GLUCOSE METER) w/Device KIT Use to check blood sugar daily 1 kit 0   Calcium Carbonate-Vitamin D 600-400 MG-UNIT tablet Take 1 tablet by mouth 2 (two) times daily.     diclofenac Sodium (VOLTAREN) 1 % GEL APPLY 2 GRAMS  TOPICALLY TO THE AFFECTED AREA 4 TIMES DAILY. 700 g 11    esomeprazole (NEXIUM) 40 MG capsule TAKE 1 CAPSULE 2 (TWO) TIMES DAILY BEFORE A MEAL. 180 capsule 1   FLUoxetine (PROZAC) 20 MG capsule TAKE 1 CAPSULE EVERY DAY 90 capsule 1   fluticasone (FLONASE) 50 MCG/ACT nasal spray USE 2 SPRAYS INTO THE NOSE DAILY AS DIRECTED (SUBSTITUTED FOR FLONASE) 48 g 3   furosemide (LASIX) 20 MG tablet TAKE 1 TABLET BY MOUTH EVERY DAY AS NEEDED 30 tablet 5   gabapentin (NEURONTIN) 100 MG capsule TAKE 1 CAPSULE IN THE MORNING, 1 CAPSULE AT LUNCH, 1 CAPSULES AT DINNER AND 2 TO 3 CAPSULES AT BEDTIME 540 capsule 5   Glucosamine 500 MG TABS Take 500 mg by mouth daily.     glucose blood (TRUE METRIX BLOOD GLUCOSE TEST) test strip Use to check blood sugar daily 100 each 3   isosorbide mononitrate (IMDUR) 30 MG 24 hr tablet TAKE 1 TABLET EVERY DAY 90 tablet 3   KLOR-CON M20 20 MEQ tablet TAKE 1 TABLET BY MOUTH EVERY DAY 90 tablet 1   latanoprost (XALATAN) 0.005 % ophthalmic solution Place 1 drop into both eyes at bedtime.     lidocaine (LIDODERM) 5 %  APPLY 1 PATCH ONTO THE SKIN EVERY DAY. REMOVE AND DISCARD PATCH WITHIN 12 HOURS OR AS DIRECTED BY PHYSICIAN. 90 patch 3   loratadine (CLARITIN) 10 MG tablet Take 10 mg by mouth daily.     losartan-hydrochlorothiazide (HYZAAR) 100-25 MG tablet Take 1 tablet by mouth daily.     meloxicam (MOBIC) 15 MG tablet TAKE 1 TABLET BY MOUTH EVERY DAY 30 tablet 1   Omega-3 Fatty Acids (FISH OIL) 1000 MG CAPS Take 1,000 mg by mouth daily.      polycarbophil (FIBERCON) 625 MG tablet Take 625 mg by mouth daily.     tamoxifen (NOLVADEX) 20 MG tablet TAKE 1 TABLET EVERY DAY 90 tablet 3   tolterodine (DETROL LA) 4 MG 24 hr capsule TAKE 1 CAPSULE BY MOUTH EVERY DAY 90 capsule 3   traMADol (ULTRAM) 50 MG tablet TAKE 1 TABLET EVERY 6 HOURS AS NEEDED FOR PAIN 90 tablet 0   triamcinolone cream (KENALOG) 0.5 % Apply 1 application topically 2 (two) times daily. 30 g 0   TRUEplus Lancets 28G MISC TEST BLOOD SUGAR EVERY DAY 100 each 3   No  facility-administered medications prior to visit.     Per HPI unless specifically indicated in ROS section below Review of Systems  Constitutional:  Negative for fatigue and fever.  HENT:  Negative for ear pain.   Eyes:  Negative for pain.  Respiratory:  Negative for chest tightness and shortness of breath.   Cardiovascular:  Negative for chest pain, palpitations and leg swelling.  Gastrointestinal:  Negative for abdominal pain.  Genitourinary:  Negative for dysuria.  Objective:  BP 132/70   Pulse 84   Temp 98.2 F (36.8 C) (Temporal)   Ht _0  (1.499 m)   Wt 133 lb 12 oz (60.7 kg)   SpO2 96%   BMI 27.01 kg/m   Wt Readings from Last 3 Encounters:  06/16/20 133 lb 12 oz (60.7 kg)  04/12/20 131 lb 8 oz (59.6 kg)  03/29/20 135 lb (61.2 kg)      Physical Exam Constitutional:      General: She is not in acute distress.    Appearance: Normal appearance. She is well-developed. She is not ill-appearing or toxic-appearing.  HENT:     Head: Normocephalic.     Right Ear: Hearing, tympanic membrane, ear canal and external ear normal. Tympanic membrane is not erythematous, retracted or bulging.     Left Ear: Hearing, tympanic membrane, ear canal and external ear normal. Tympanic membrane is not erythematous, retracted or bulging.     Nose: No mucosal edema or rhinorrhea.     Right Sinus: No maxillary sinus tenderness or frontal sinus tenderness.     Left Sinus: No maxillary sinus tenderness or frontal sinus tenderness.     Mouth/Throat:     Pharynx: Uvula midline.  Eyes:     General: Lids are normal. Lids are everted, no foreign bodies appreciated.     Conjunctiva/sclera: Conjunctivae normal.     Pupils: Pupils are equal, round, and reactive to light.  Neck:     Thyroid: No thyroid mass or thyromegaly.     Vascular: No carotid bruit.     Trachea: Trachea normal.  Cardiovascular:     Rate and Rhythm: Normal rate and regular rhythm.     Pulses: Normal pulses.     Heart sounds:  Normal heart sounds, S1 normal and S2 normal. No murmur heard.   No friction rub. No gallop.  Pulmonary:  Effort: Pulmonary effort is normal. No tachypnea or respiratory distress.     Breath sounds: Normal breath sounds. No decreased breath sounds, wheezing, rhonchi or rales.  Abdominal:     General: Bowel sounds are normal.     Palpations: Abdomen is soft.     Tenderness: There is no abdominal tenderness.  Musculoskeletal:     Right hand: Swelling and tenderness present. Decreased range of motion.     Cervical back: Normal range of motion and neck supple.     Comments:  Over left MCP joints  Skin:    General: Skin is warm and dry.     Findings: No rash.  Neurological:     Mental Status: She is alert.  Psychiatric:        Mood and Affect: Mood is not anxious or depressed.        Speech: Speech normal.        Behavior: Behavior normal. Behavior is cooperative.        Thought Content: Thought content normal.        Judgment: Judgment normal.      Results for orders placed or performed during the hospital encounter of 61/60/73  Basic metabolic panel  Result Value Ref Range   Sodium 136 135 - 145 mmol/L   Potassium 3.6 3.5 - 5.1 mmol/L   Chloride 102 98 - 111 mmol/L   CO2 25 22 - 32 mmol/L   Glucose, Bld 94 70 - 99 mg/dL   BUN 14 8 - 23 mg/dL   Creatinine, Ser 0.64 0.44 - 1.00 mg/dL   Calcium 8.9 8.9 - 10.3 mg/dL   GFR, Estimated >60 >60 mL/min   Anion gap 9 5 - 15    This visit occurred during the SARS-CoV-2 public health emergency.  Safety protocols were in place, including screening questions prior to the visit, additional usage of staff PPE, and extensive cleaning of exam room while observing appropriate contact time as indicated for disinfecting solutions.   COVID 19 screen:  No recent travel or known exposure to COVID19 The patient denies respiratory symptoms of COVID 19 at this time. The importance of social distancing was discussed today.   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   Treat with prednisone but given DM follow CBGs closely while on.   If not improving consider X-ray for further eval.       Relevant Orders   Uric acid (Completed)   Ms.,eds Orders Placed This Encounter  Procedures   Uric acid    Meds ordered this encounter  Medications   predniSONE (DELTASONE) 20 MG tablet    Sig: 2 tabs by mouth daily x 5 days, then 1 tabs by mouth daily x 5 days    Dispense:  15 tablet    Refill:  0    Eliezer Lofts, MD

## 2020-06-16 NOTE — Patient Instructions (Addendum)
Please stop at the lab to have labs drawn.  Complete prednisone taper.  Follow blood sugar at home... call if blood sugar elevated.

## 2020-07-05 ENCOUNTER — Inpatient Hospital Stay: Payer: Medicare HMO

## 2020-07-06 ENCOUNTER — Other Ambulatory Visit: Payer: Self-pay

## 2020-07-06 ENCOUNTER — Inpatient Hospital Stay: Payer: Medicare HMO | Attending: Hematology and Oncology

## 2020-07-06 DIAGNOSIS — E538 Deficiency of other specified B group vitamins: Secondary | ICD-10-CM

## 2020-07-06 MED ORDER — CYANOCOBALAMIN 1000 MCG/ML IJ SOLN
1000.0000 ug | Freq: Once | INTRAMUSCULAR | Status: AC
  Administered 2020-07-06: 1000 ug via INTRAMUSCULAR

## 2020-07-24 ENCOUNTER — Telehealth: Payer: Self-pay | Admitting: Family Medicine

## 2020-07-24 DIAGNOSIS — E1140 Type 2 diabetes mellitus with diabetic neuropathy, unspecified: Secondary | ICD-10-CM

## 2020-07-24 DIAGNOSIS — D509 Iron deficiency anemia, unspecified: Secondary | ICD-10-CM

## 2020-07-24 DIAGNOSIS — E538 Deficiency of other specified B group vitamins: Secondary | ICD-10-CM

## 2020-07-24 NOTE — Telephone Encounter (Signed)
-----   Message from Cloyd Stagers, RT sent at 07/10/2020 11:30 AM EDT ----- Regarding: Lab Orders for Tuesday 7.5.2022 Please place lab orders for Tuesday 7.5.2022, office visit for 6 month f/u on Thursday 7.7.2022 Thank you, Dyke Maes RT(R)

## 2020-07-25 ENCOUNTER — Other Ambulatory Visit (INDEPENDENT_AMBULATORY_CARE_PROVIDER_SITE_OTHER): Payer: Medicare HMO

## 2020-07-25 ENCOUNTER — Other Ambulatory Visit: Payer: Self-pay

## 2020-07-25 DIAGNOSIS — E1140 Type 2 diabetes mellitus with diabetic neuropathy, unspecified: Secondary | ICD-10-CM | POA: Diagnosis not present

## 2020-07-25 LAB — HEMOGLOBIN A1C: Hgb A1c MFr Bld: 6.2 % (ref 4.6–6.5)

## 2020-07-25 LAB — COMPREHENSIVE METABOLIC PANEL
ALT: 8 U/L (ref 0–35)
AST: 12 U/L (ref 0–37)
Albumin: 3.6 g/dL (ref 3.5–5.2)
Alkaline Phosphatase: 43 U/L (ref 39–117)
BUN: 13 mg/dL (ref 6–23)
CO2: 28 mEq/L (ref 19–32)
Calcium: 8.4 mg/dL (ref 8.4–10.5)
Chloride: 103 mEq/L (ref 96–112)
Creatinine, Ser: 0.86 mg/dL (ref 0.40–1.20)
GFR: 65.28 mL/min (ref >60.00)
Glucose, Bld: 86 mg/dL (ref 70–99)
Potassium: 3 mEq/L — ABNORMAL LOW (ref 3.5–5.1)
Sodium: 140 mEq/L (ref 135–145)
Total Bilirubin: 0.3 mg/dL (ref 0.2–1.2)
Total Protein: 5.8 g/dL — ABNORMAL LOW (ref 6.0–8.3)

## 2020-07-25 LAB — LIPID PANEL
Cholesterol: 121 mg/dL (ref 0–200)
HDL: 51 mg/dL (ref >39.00)
LDL Cholesterol: 51 mg/dL (ref 0–99)
NonHDL: 70.48
Total CHOL/HDL Ratio: 2
Triglycerides: 95 mg/dL (ref 0.0–149.0)
VLDL: 19 mg/dL (ref 0.0–40.0)

## 2020-07-26 NOTE — Progress Notes (Signed)
[name] notified as instructed by telephone.  She states she has not increased her Lasix.  She will increase her potassium to 40 meq today and prior to her appointment tomorrow.

## 2020-07-27 ENCOUNTER — Encounter: Payer: Self-pay | Admitting: Family Medicine

## 2020-07-27 ENCOUNTER — Ambulatory Visit (INDEPENDENT_AMBULATORY_CARE_PROVIDER_SITE_OTHER): Payer: Medicare HMO | Admitting: Family Medicine

## 2020-07-27 ENCOUNTER — Other Ambulatory Visit: Payer: Self-pay

## 2020-07-27 ENCOUNTER — Ambulatory Visit (INDEPENDENT_AMBULATORY_CARE_PROVIDER_SITE_OTHER)
Admission: RE | Admit: 2020-07-27 | Discharge: 2020-07-27 | Disposition: A | Discharge status: Home / Self Care | Payer: Medicare HMO | Source: Ambulatory Visit | Attending: Family Medicine | Admitting: Family Medicine

## 2020-07-27 VITALS — BP 138/76 | HR 70 | Temp 97.2°F | Ht 59.65 in | Wt 128.0 lb

## 2020-07-27 DIAGNOSIS — M79642 Pain in left hand: Secondary | ICD-10-CM | POA: Diagnosis not present

## 2020-07-27 DIAGNOSIS — E1159 Type 2 diabetes mellitus with other circulatory complications: Secondary | ICD-10-CM | POA: Diagnosis not present

## 2020-07-27 DIAGNOSIS — E1169 Type 2 diabetes mellitus with other specified complication: Secondary | ICD-10-CM

## 2020-07-27 DIAGNOSIS — E876 Hypokalemia: Secondary | ICD-10-CM

## 2020-07-27 DIAGNOSIS — E785 Hyperlipidemia, unspecified: Secondary | ICD-10-CM

## 2020-07-27 DIAGNOSIS — E1140 Type 2 diabetes mellitus with diabetic neuropathy, unspecified: Secondary | ICD-10-CM

## 2020-07-27 DIAGNOSIS — I152 Hypertension secondary to endocrine disorders: Secondary | ICD-10-CM | POA: Diagnosis not present

## 2020-07-27 DIAGNOSIS — E1142 Type 2 diabetes mellitus with diabetic polyneuropathy: Secondary | ICD-10-CM | POA: Diagnosis not present

## 2020-07-27 DIAGNOSIS — M7989 Other specified soft tissue disorders: Secondary | ICD-10-CM | POA: Diagnosis not present

## 2020-07-27 DIAGNOSIS — I5032 Chronic diastolic (congestive) heart failure: Secondary | ICD-10-CM

## 2020-07-27 LAB — POTASSIUM: Potassium: 4.3 mEq/L (ref 3.5–5.1)

## 2020-07-27 NOTE — Assessment & Plan Note (Signed)
Has been on increased KDur dose in last 2 days.Marland Kitchen re-eval potassium.

## 2020-07-27 NOTE — Assessment & Plan Note (Signed)
Euvolemic in office today 

## 2020-07-27 NOTE — Assessment & Plan Note (Addendum)
Stable, chronic.  Continue current medication.  On losartan 50 mg daily... still having potassium issues off or HCTZ as she is on lasix.

## 2020-07-27 NOTE — Patient Instructions (Addendum)
Please stop at the lab to have labs drawn. We will call with X-ray result.  Can use voltaren up to four times daily as needed for hand pain.

## 2020-07-27 NOTE — Progress Notes (Signed)
Patient ID: Ashley Ryan, female    DOB: 12-16-1945, 75 y.o.   MRN: [id number]  This visit was conducted in person.  BP 138/76   Pulse 70   Temp (!) 97.2 F (36.2 C) (Temporal)   Ht 4' 11.65" (1.515 m)   Wt 128 lb (58.1 kg)   SpO2 93%   BMI 25.29 kg/m    CC: Chief Complaint  Patient presents with   Follow-up   Diabetes    Subjective:   HPI: ALIANNA Ryan is a 75 y.o. female presenting on 07/27/2020 for Follow-up and Diabetes 6 month follow up  Diabetes:   good control  Lab Results  Component Value Date   HGBA1C 6.2 07/25/2020  Using medications without difficulties: Hypoglycemic episodes:none Hyperglycemic episodes:none Feet problems: no ulcers Blood Sugars averaging: not checking regular eye exam within last year: due has scheduled in 11/2020  Hypertension:    BP Readings from Last 3 Encounters:  07/27/20 138/76  06/16/20 132/70  04/12/20 120/70  Using medication without problems or lightheadedness:  none Chest pain with exertion:none Edema:none Short of breath:none Average home BPs: Other issues: HFpEF:  Euvolemic in office today.  Elevated Cholesterol: LDL at goal < 70 with history of CAD Using medications without problems: Muscle aches:  Diet compliance: Exercise: Other complaints:    Hypokalemia.Marland Kitchen she has been taking 80 MEQ in last 2 days. Likely due to lasix.  Seen for  left hand pain.. treated with prednisone taper, helped some.  Still bothering her.. using voltaren gel BID.  Pain and swelling, no redness over left 2 and 3rd MCP joints.   Relevant past medical, surgical, family and social history reviewed and updated as indicated. Interim medical history since our last visit reviewed. Allergies and medications reviewed and updated. Outpatient Medications Prior to Visit  Medication Sig Dispense Refill   Alcohol Swabs (B-D SINGLE USE SWABS REGULAR) PADS USE  TO TEST BLOOD SUGAR ONE TIME DAILY 100 each 3   atorvastatin (LIPITOR) 40 MG  tablet TAKE 1 TABLET AT BEDTIME 90 tablet 2   Blood Glucose Calibration (ACCU-CHEK AVIVA) SOLN Use to check blood sugar once daily 1 each 0   Blood Glucose Calibration (TRUE METRIX LEVEL 1) Low SOLN USE AS DIRECTED 1 each 3   Blood Glucose Monitoring Suppl (TRUE METRIX AIR GLUCOSE METER) w/Device KIT Use to check blood sugar daily 1 kit 0   Calcium Carbonate-Vitamin D 600-400 MG-UNIT tablet Take 1 tablet by mouth 2 (two) times daily.     diclofenac Sodium (VOLTAREN) 1 % GEL APPLY 2 GRAMS  TOPICALLY TO THE AFFECTED AREA 4 TIMES DAILY. 700 g 11   esomeprazole (NEXIUM) 40 MG capsule TAKE 1 CAPSULE 2 (TWO) TIMES DAILY BEFORE A MEAL. 180 capsule 1   FLUoxetine (PROZAC) 20 MG capsule TAKE 1 CAPSULE EVERY DAY 90 capsule 1   fluticasone (FLONASE) 50 MCG/ACT nasal spray USE 2 SPRAYS INTO THE NOSE DAILY AS DIRECTED (SUBSTITUTED FOR FLONASE) 48 g 3   furosemide (LASIX) 20 MG tablet TAKE 1 TABLET BY MOUTH EVERY DAY AS NEEDED 30 tablet 5   gabapentin (NEURONTIN) 100 MG capsule TAKE 1 CAPSULE IN THE MORNING, 1 CAPSULE AT LUNCH, 1 CAPSULES AT DINNER AND 2 TO 3 CAPSULES AT BEDTIME 540 capsule 5   Glucosamine 500 MG TABS Take 500 mg by mouth daily.     glucose blood (TRUE METRIX BLOOD GLUCOSE TEST) test strip Use to check blood sugar daily 100 each 3   isosorbide mononitrate (  IMDUR) 30 MG 24 hr tablet TAKE 1 TABLET EVERY DAY 90 tablet 3   KLOR-CON M20 20 MEQ tablet TAKE 1 TABLET BY MOUTH EVERY DAY 90 tablet 1   latanoprost (XALATAN) 0.005 % ophthalmic solution Place 1 drop into both eyes at bedtime.     lidocaine (LIDODERM) 5 % APPLY 1 PATCH ONTO THE SKIN EVERY DAY. REMOVE AND DISCARD PATCH WITHIN 12 HOURS OR AS DIRECTED BY PHYSICIAN. 90 patch 3   loratadine (CLARITIN) 10 MG tablet Take 10 mg by mouth daily.     losartan-hydrochlorothiazide (HYZAAR) 100-25 MG tablet Take 1 tablet by mouth daily.     Omega-3 Fatty Acids (FISH OIL) 1000 MG CAPS Take 1,000 mg by mouth daily.      polycarbophil (FIBERCON) 625 MG  tablet Take 625 mg by mouth daily.     predniSONE (DELTASONE) 20 MG tablet 2 tabs by mouth daily x 5 days, then 1 tabs by mouth daily x 5 days 15 tablet 0   tamoxifen (NOLVADEX) 20 MG tablet TAKE 1 TABLET EVERY DAY 90 tablet 3   tolterodine (DETROL LA) 4 MG 24 hr capsule TAKE 1 CAPSULE BY MOUTH EVERY DAY 90 capsule 3   traMADol (ULTRAM) 50 MG tablet TAKE 1 TABLET EVERY 6 HOURS AS NEEDED FOR PAIN 90 tablet 0   triamcinolone cream (KENALOG) 0.5 % Apply 1 application topically 2 (two) times daily. 30 g 0   TRUEplus Lancets 28G MISC TEST BLOOD SUGAR EVERY DAY 100 each 3   No facility-administered medications prior to visit.     Per HPI unless specifically indicated in ROS section below Review of Systems  Constitutional:  Negative for fatigue and fever.  HENT:  Negative for ear pain.   Eyes:  Negative for pain.  Respiratory:  Negative for chest tightness and shortness of breath.   Cardiovascular:  Negative for chest pain, palpitations and leg swelling.  Gastrointestinal:  Negative for abdominal pain.  Genitourinary:  Negative for dysuria.  Objective:  BP 138/76   Pulse 70   Temp (!) 97.2 F (36.2 C) (Temporal)   Ht 4' 11.65" (1.515 m)   Wt 128 lb (58.1 kg)   SpO2 93%   BMI 25.29 kg/m   Wt Readings from Last 3 Encounters:  07/27/20 128 lb (58.1 kg)  06/16/20 133 lb 12 oz (60.7 kg)  04/12/20 131 lb 8 oz (59.6 kg)      Physical Exam Constitutional:      General: She is not in acute distress.    Appearance: Normal appearance. She is well-developed. She is not ill-appearing or toxic-appearing.  HENT:     Head: Normocephalic.     Right Ear: Hearing, tympanic membrane, ear canal and external ear normal. Tympanic membrane is not erythematous, retracted or bulging.     Left Ear: Hearing, tympanic membrane, ear canal and external ear normal. Tympanic membrane is not erythematous, retracted or bulging.     Nose: No mucosal edema or rhinorrhea.     Right Sinus: No maxillary sinus  tenderness or frontal sinus tenderness.     Left Sinus: No maxillary sinus tenderness or frontal sinus tenderness.     Mouth/Throat:     Pharynx: Uvula midline.  Eyes:     General: Lids are normal. Lids are everted, no foreign bodies appreciated.     Conjunctiva/sclera: Conjunctivae normal.     Pupils: Pupils are equal, round, and reactive to light.  Neck:     Thyroid: No thyroid mass or thyromegaly.  Vascular: No carotid bruit.     Trachea: Trachea normal.  Cardiovascular:     Rate and Rhythm: Normal rate and regular rhythm.     Pulses: Normal pulses.     Heart sounds: Normal heart sounds, S1 normal and S2 normal. No murmur heard.   No friction rub. No gallop.  Pulmonary:     Effort: Pulmonary effort is normal. No tachypnea or respiratory distress.     Breath sounds: Normal breath sounds. No decreased breath sounds, wheezing, rhonchi or rales.  Abdominal:     General: Bowel sounds are normal.     Palpations: Abdomen is soft.     Tenderness: There is no abdominal tenderness.  Musculoskeletal:     Cervical back: Normal range of motion and neck supple.     Comments:  Slight swelling, no redness over 2 and 3rd MCP joints on left, no decreased ROM  Skin:    General: Skin is warm and dry.     Findings: No rash.  Neurological:     Mental Status: She is alert.  Psychiatric:        Mood and Affect: Mood is not anxious or depressed.        Speech: Speech normal.        Behavior: Behavior normal. Behavior is cooperative.        Thought Content: Thought content normal.        Judgment: Judgment normal.      Results for orders placed or performed in visit on 07/25/20  Comprehensive metabolic panel  Result Value Ref Range   Sodium 140 135 - 145 mEq/L   Potassium 3.0 (L) 3.5 - 5.1 mEq/L   Chloride 103 96 - 112 mEq/L   CO2 28 19 - 32 mEq/L   Glucose, Bld 86 70 - 99 mg/dL   BUN 13 6 - 23 mg/dL   Creatinine, Ser 0.86 0.40 - 1.20 mg/dL   Total Bilirubin 0.3 0.2 - 1.2 mg/dL    Alkaline Phosphatase 43 39 - 117 U/L   AST 12 0 - 37 U/L   ALT 8 0 - 35 U/L   Total Protein 5.8 (L) 6.0 - 8.3 g/dL   Albumin 3.6 3.5 - 5.2 g/dL   GFR 65.28 >60.00 mL/min   Calcium 8.4 8.4 - 10.5 mg/dL  Hemoglobin A1c  Result Value Ref Range   Hgb A1c MFr Bld 6.2 4.6 - 6.5 %  Lipid panel  Result Value Ref Range   Cholesterol 121 0 - 200 mg/dL   Triglycerides 95.0 0.0 - 149.0 mg/dL   HDL 51.00 >39.00 mg/dL   VLDL 19.0 0.0 - 40.0 mg/dL   LDL Cholesterol 51 0 - 99 mg/dL   Total CHOL/HDL Ratio 2    NonHDL 70.48     This visit occurred during the SARS-CoV-2 public health emergency.  Safety protocols were in place, including screening questions prior to the visit, additional usage of staff PPE, and extensive cleaning of exam room while observing appropriate contact time as indicated for disinfecting solutions.   COVID 19 screen:  No recent travel or known exposure to COVID19 The patient denies respiratory symptoms of COVID 19 at this time. The importance of social distancing was discussed today.   Assessment and Plan Problem List Items Addressed This Visit     (HFpEF) heart failure with preserved ejection fraction (HCC) (Chronic)     Euvolemic in office today.       Controlled type 2 diabetes with neuropathy ([place]) - Primary (  Chronic)    Stable, chronic. Diet controlled.  Associated with neuropathy.. controlled with  Gabapentin.          Diabetic peripheral neuropathy (HCC) (Chronic)    Controlled with gabapentin.       Hyperlipidemia associated with type 2 diabetes mellitus (HCC) (Chronic)    Stable, chronic.  Continue current medication.   Atorvastatin 40 mg daily       Hypertension associated with diabetes (HCC)    Stable, chronic.  Continue current medication.  On losartan 50 mg daily... still having potassium issues off or HCTZ as she is on lasix.         Hypokalemia    Has been on increased KDur dose in last 2 days.Marland Kitchen re-eval potassium.        Relevant Orders   Potassium   Other Visit Diagnoses     Left hand pain       Relevant Orders   DG Hand Complete Left       Orders Placed This Encounter  Procedures   DG Hand Complete Left    Standing Status:   Future    Number of Occurrences:   1    Standing Expiration Date:   07/27/2021    Order Specific Question:   Reason for Exam (SYMPTOM  OR DIAGNOSIS REQUIRED)    Answer:   left hand pain over 2 nd and 3rd  MCP    Order Specific Question:   Preferred imaging location?    Answer:   [facility]   Potassium        Eliezer Lofts, MD

## 2020-07-27 NOTE — Assessment & Plan Note (Signed)
Stable, chronic. Diet controlled.  Associated with neuropathy.. controlled with  Gabapentin.

## 2020-07-27 NOTE — Assessment & Plan Note (Signed)
Stable, chronic.  Continue current medication.   Atorvastatin 40 mg daily. 

## 2020-07-27 NOTE — Assessment & Plan Note (Signed)
Controlled with gabapentin.

## 2020-08-04 ENCOUNTER — Ambulatory Visit
Admission: RE | Admit: 2020-08-04 | Discharge: 2020-08-04 | Disposition: A | Discharge status: Home / Self Care | Payer: Medicare HMO | Source: Ambulatory Visit | Attending: Hematology and Oncology | Admitting: Hematology and Oncology

## 2020-08-04 ENCOUNTER — Other Ambulatory Visit: Payer: Self-pay

## 2020-08-04 DIAGNOSIS — Z853 Personal history of malignant neoplasm of breast: Secondary | ICD-10-CM | POA: Insufficient documentation

## 2020-08-04 DIAGNOSIS — Z9012 Acquired absence of left breast and nipple: Secondary | ICD-10-CM | POA: Diagnosis not present

## 2020-08-04 DIAGNOSIS — Z1231 Encounter for screening mammogram for malignant neoplasm of breast: Secondary | ICD-10-CM | POA: Insufficient documentation

## 2020-08-04 DIAGNOSIS — D0512 Intraductal carcinoma in situ of left breast: Secondary | ICD-10-CM

## 2020-08-09 ENCOUNTER — Inpatient Hospital Stay: Payer: Medicare HMO | Attending: Hematology and Oncology

## 2020-08-09 ENCOUNTER — Other Ambulatory Visit: Payer: Self-pay

## 2020-08-09 DIAGNOSIS — E538 Deficiency of other specified B group vitamins: Secondary | ICD-10-CM | POA: Diagnosis not present

## 2020-08-09 MED ORDER — CYANOCOBALAMIN 1000 MCG/ML IJ SOLN
1000.0000 ug | Freq: Once | INTRAMUSCULAR | Status: AC
  Administered 2020-08-09: 1000 ug via INTRAMUSCULAR

## 2020-08-16 ENCOUNTER — Ambulatory Visit: Payer: Medicare HMO | Admitting: Family Medicine

## 2020-08-17 ENCOUNTER — Other Ambulatory Visit: Payer: Self-pay | Admitting: Family Medicine

## 2020-08-21 ENCOUNTER — Other Ambulatory Visit: Payer: Self-pay | Admitting: *Deleted

## 2020-08-21 ENCOUNTER — Other Ambulatory Visit: Payer: Self-pay | Admitting: Family Medicine

## 2020-08-21 MED ORDER — TAMOXIFEN CITRATE 20 MG PO TABS: 20.0000 mg | ORAL_TABLET | Freq: Every day | ORAL | 0 refills | Status: DC

## 2020-08-22 DIAGNOSIS — M79642 Pain in left hand: Secondary | ICD-10-CM | POA: Insufficient documentation

## 2020-08-22 NOTE — Progress Notes (Signed)
    Jennie Hannay T. Trent Gabler, MD, [facility] [place], [address]  Phone: [phone]  FAX: [phone]  Ashley Ryan - 75 y.o. female  MRN [id number]  Date of Birth: Jun 03, 1945  Date: 08/23/2020  PCP: [name], MD  Referral: [name], MD  Chief Complaint  Patient presents with   Knee Pain    Right injection    This visit occurred during the SARS-CoV-2 public health emergency.  Safety protocols were in place, including screening questions prior to the visit, additional usage of staff PPE, and extensive cleaning of exam room while observing appropriate contact time as indicated for disinfecting solutions.    Known severe OA on R knee:  09/29/2019:  [employer] approved. == ask about if we can go ahead and give to patient.   Aspiration/Injection Procedure Note BHUMI KERSTIENS March 01, 1945 Date of procedure: 08/23/2020  Procedure: Large Joint Aspiration / Injection of Knee, R Indications: Pain  Procedure Details Patient verbally consented to procedure. Risks, benefits, and alternatives explained. Sterilely prepped with Chloraprep. Ethyl cholride used for anesthesia. 9 cc Lidocaine 1% mixed with 1 mL of Kenalog 40 mg injected using the anteromedial approach without difficulty. No complications with procedure and tolerated well. Patient had decreased pain post-injection. Medication: 1 mL of Kenalog 40 mg   Signed,  Kevontae Burgoon T. Tanetta Fuhriman, MD

## 2020-08-22 NOTE — Assessment & Plan Note (Signed)
Eval with uric acid for possible gout.   Treat with prednisone but given DM follow CBGs closely while on.   If not improving consider X-ray for further eval.

## 2020-08-23 ENCOUNTER — Other Ambulatory Visit: Payer: Self-pay

## 2020-08-23 ENCOUNTER — Ambulatory Visit (INDEPENDENT_AMBULATORY_CARE_PROVIDER_SITE_OTHER): Payer: Medicare HMO | Admitting: Family Medicine

## 2020-08-23 ENCOUNTER — Encounter: Payer: Self-pay | Admitting: Family Medicine

## 2020-08-23 VITALS — BP 130/70 | HR 83 | Temp 98.0°F | Ht 59.65 in | Wt 136.5 lb

## 2020-08-23 DIAGNOSIS — M1711 Unilateral primary osteoarthritis, right knee: Secondary | ICD-10-CM | POA: Diagnosis not present

## 2020-08-23 MED ORDER — TRIAMCINOLONE ACETONIDE 40 MG/ML IJ SUSP
40.0000 mg | Freq: Once | INTRAMUSCULAR | Status: AC
  Administered 2020-08-23: 40 mg via INTRA_ARTICULAR

## 2020-08-27 ENCOUNTER — Telehealth: Payer: Self-pay

## 2020-08-27 NOTE — Telephone Encounter (Signed)
Owens Loffler, MD  Randall An, RN; Diamond Nickel, RN Patient has a 09/29/2019 [employer] paperwork in her chart.  Can one of you check her financial responsibility based on insurance.  I am not sure.    The benefits that were submitted and returned last year on 09/29/19 cannot be used for this year. The pt will have a different amount she has paid to her deductible at this time and other factors may have changed with her insurance. Since it is a new year, a new benefits investigation will need to be completed. There will also likely be a prior authorization that will need to be completed.  First step is to submit for benefit investigation on portal.

## 2020-08-30 NOTE — Telephone Encounter (Signed)
I have completed the benefits review request form for [employer] for patient.   Dr. Lorelei Pont has signed and has been faxed to company for review.    We are waiting for response.

## 2020-09-01 ENCOUNTER — Other Ambulatory Visit: Payer: Self-pay

## 2020-09-01 DIAGNOSIS — D0512 Intraductal carcinoma in situ of left breast: Secondary | ICD-10-CM

## 2020-09-01 DIAGNOSIS — E538 Deficiency of other specified B group vitamins: Secondary | ICD-10-CM

## 2020-09-02 IMAGING — MG DIGITAL SCREENING UNILAT RIGHT W/ TOMO W/ CAD
6 series · 6 of 18 positions shown · non-contrast
Comparison: Previous exam(s).

CLINICAL DATA: Screening.

EXAM:
DIGITAL SCREENING UNILATERAL RIGHT MAMMOGRAM WITH CAD AND TOMO

[R CC synth-2D (1 of 2)]
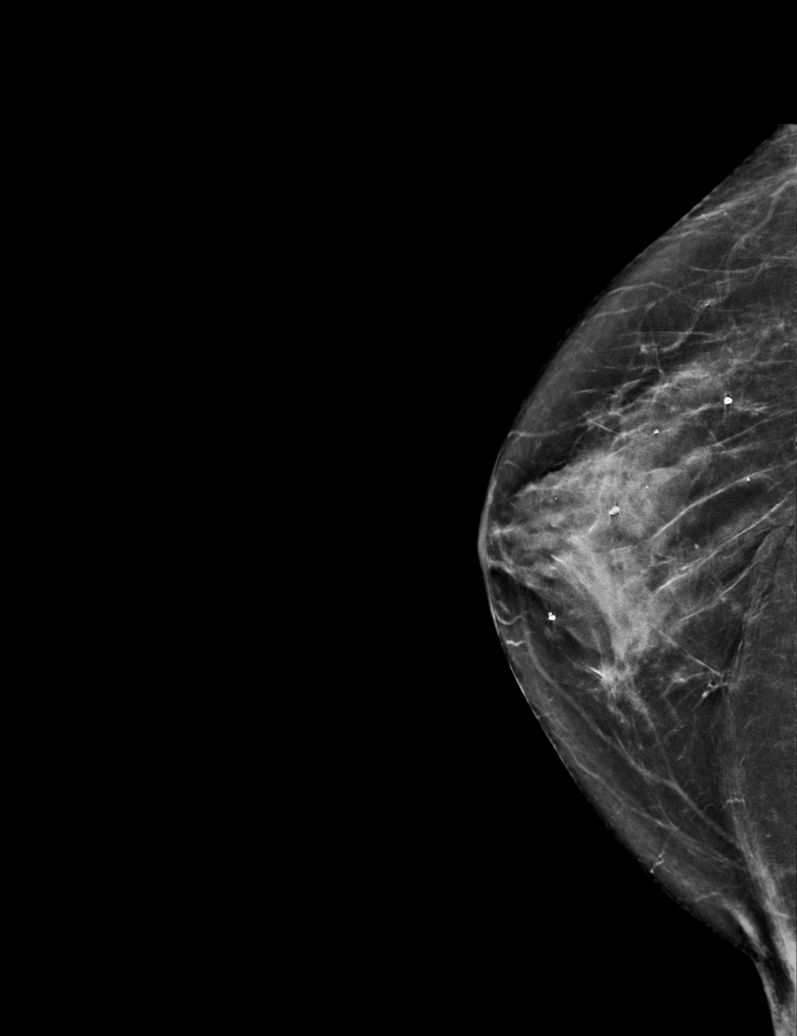

[R CC synth-2D (2 of 2)]
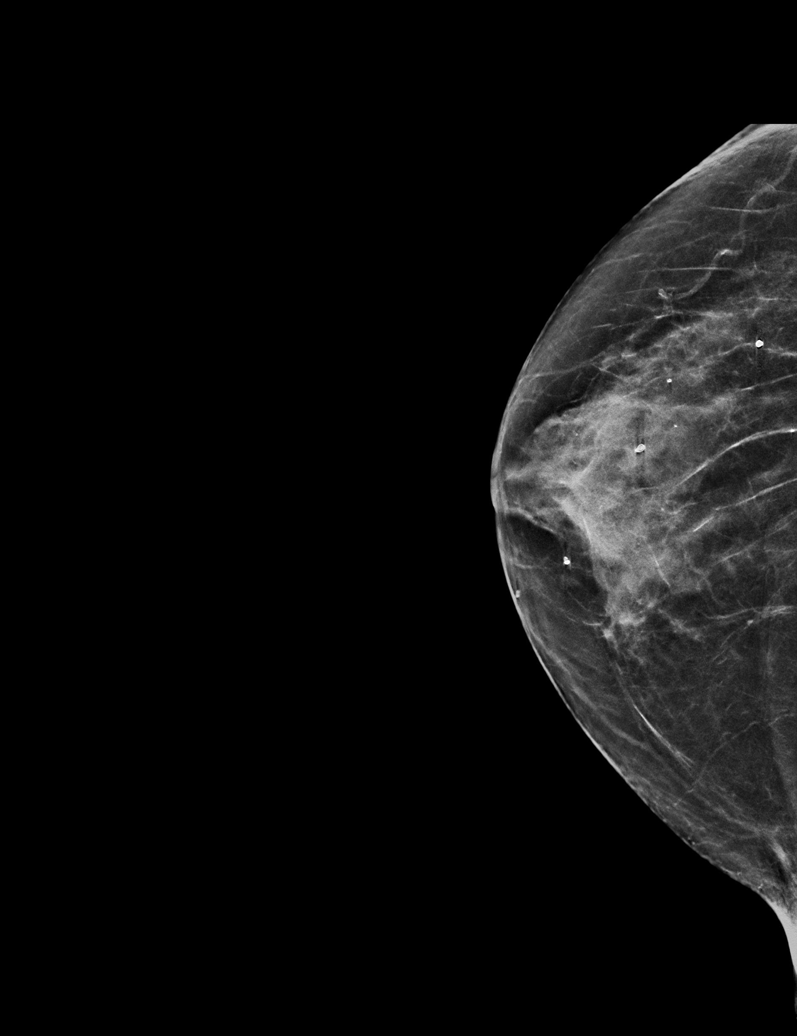

[R MLO synth-2D]
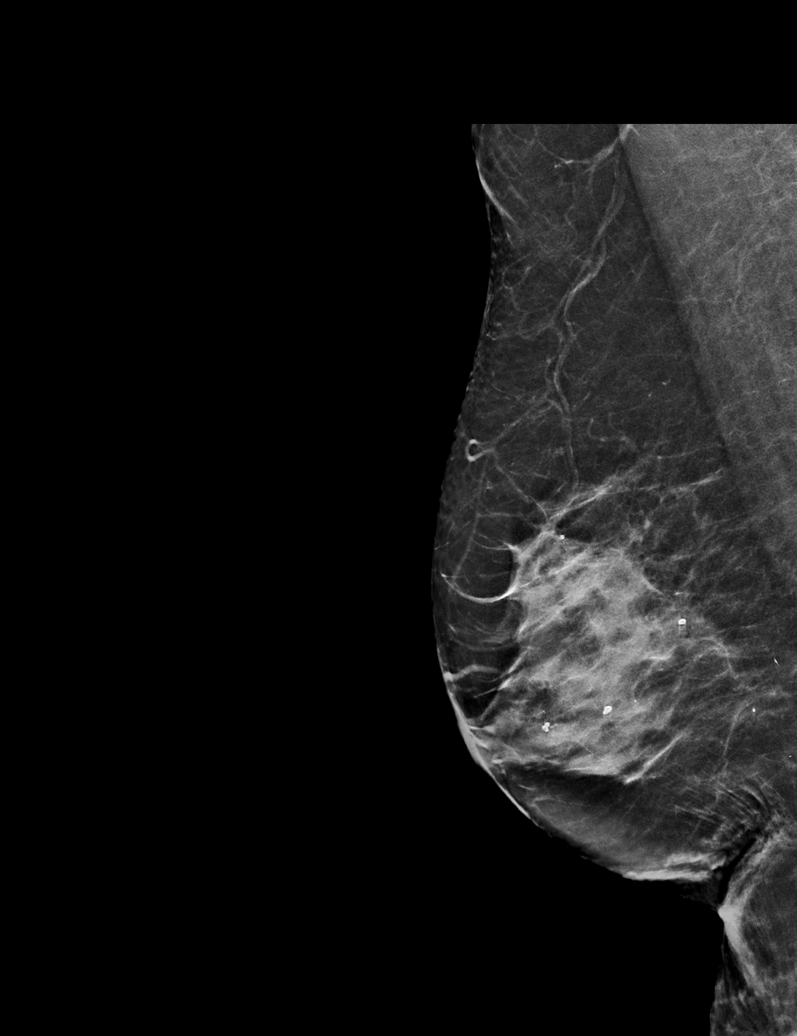

[R CC tomo (1 of 2) · tomo slice 27/54.0]
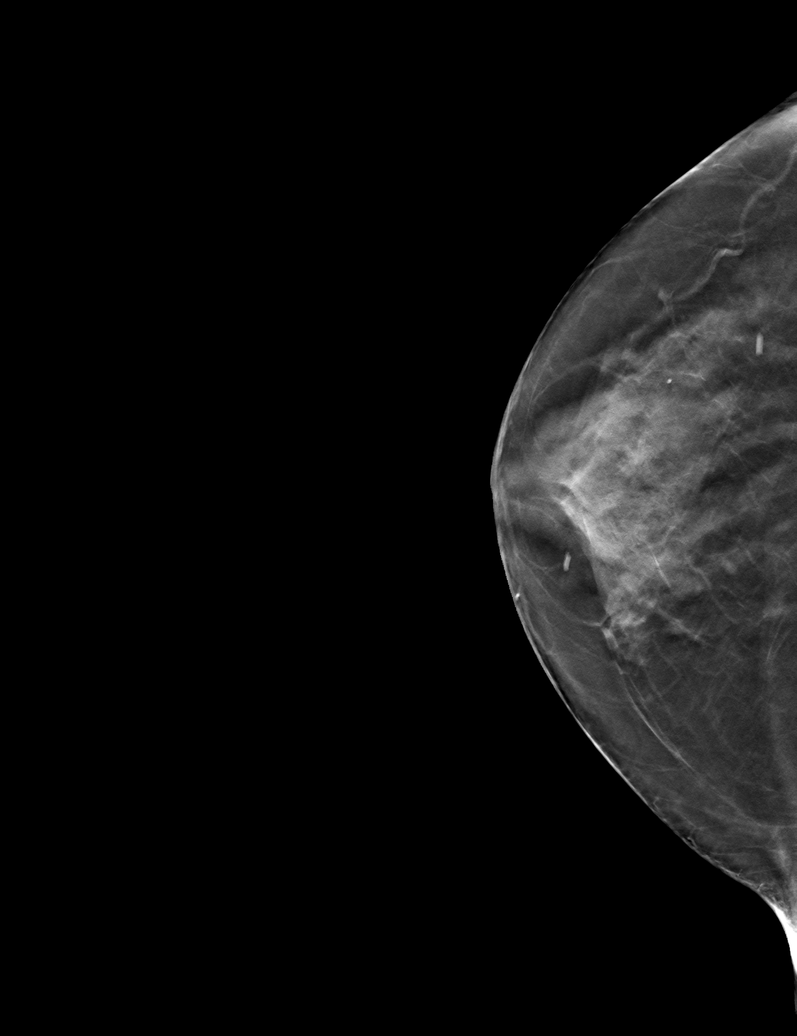

[R CC tomo (2 of 2) · tomo slice 29/56.0]
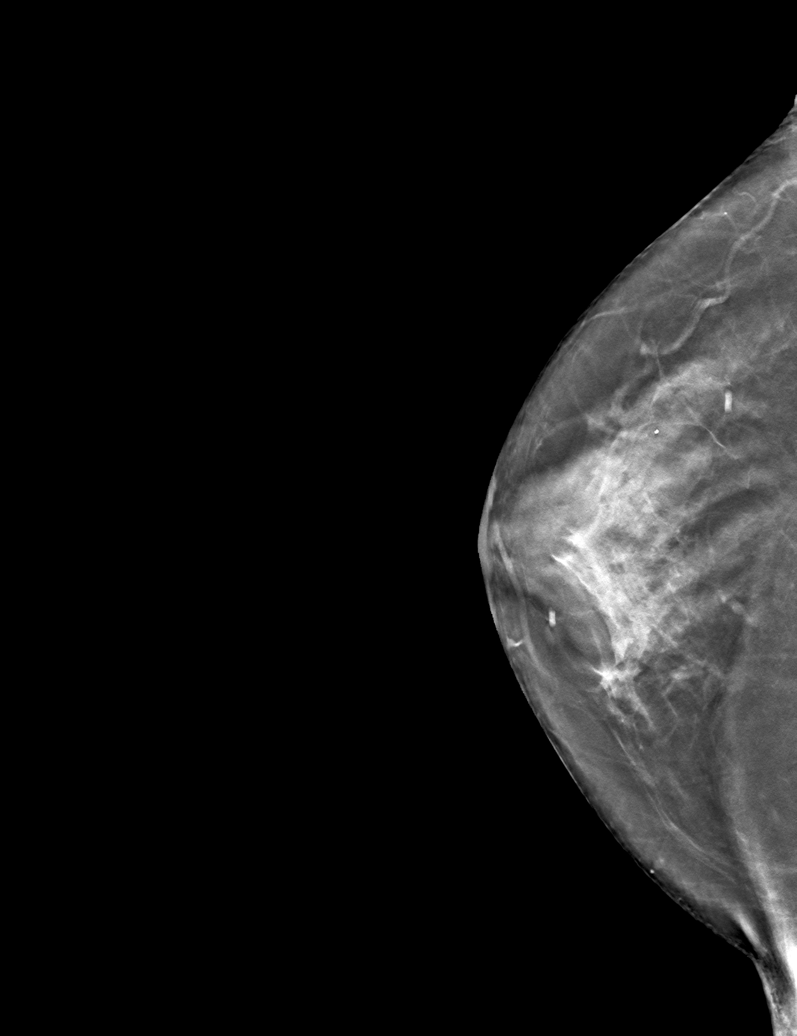

[R MLO tomo · tomo slice 29/58.0]
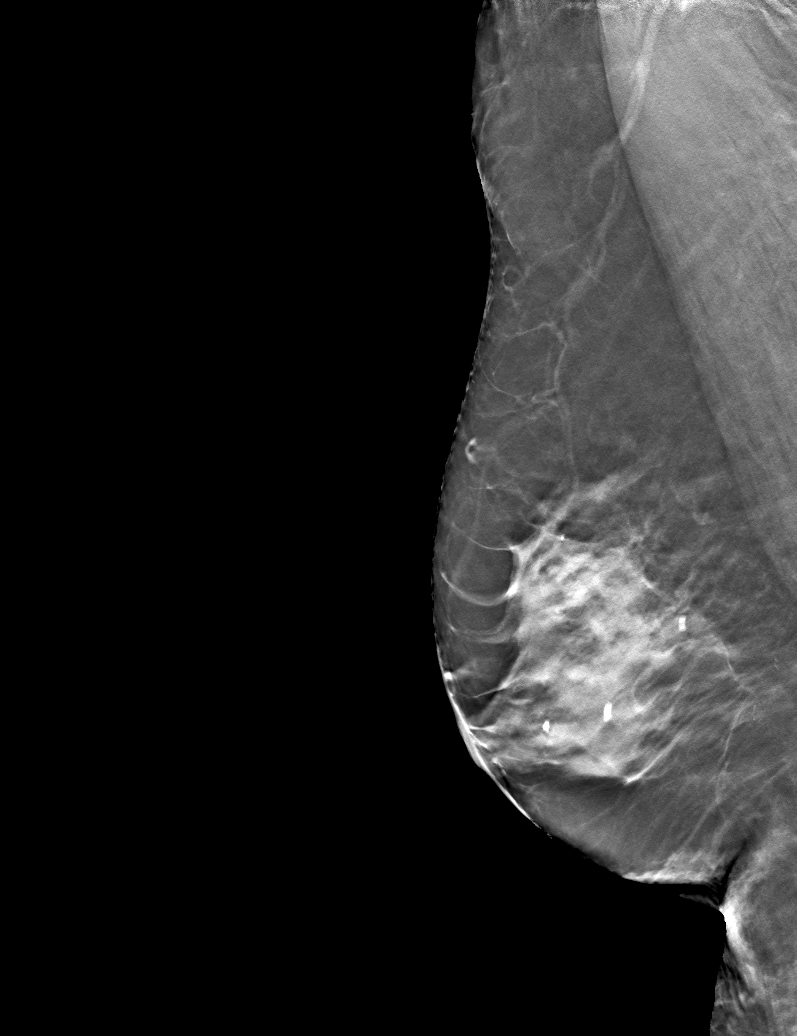

[6 of 18 positions shown; findings below may reference images not displayed]

ACR Breast Density Category c: The breast tissue is heterogeneously
dense, which may obscure small masses.
FINDINGS: The patient has had a left mastectomy. There are no findings
suspicious for malignancy. Images were processed with CAD.
IMPRESSION: No mammographic evidence of malignancy. A result letter of this
screening mammogram will be mailed directly to the patient.

RECOMMENDATION:
Screening mammogram in one year.  (Code:CY-P-D4Y)

BI-RADS CATEGORY  1: Negative.

## 2020-09-07 ENCOUNTER — Other Ambulatory Visit: Payer: Self-pay

## 2020-09-07 ENCOUNTER — Encounter: Payer: Self-pay | Admitting: Oncology

## 2020-09-07 ENCOUNTER — Inpatient Hospital Stay: Payer: Medicare HMO | Attending: Oncology

## 2020-09-07 ENCOUNTER — Inpatient Hospital Stay (HOSPITAL_BASED_OUTPATIENT_CLINIC_OR_DEPARTMENT_OTHER): Payer: Medicare HMO | Admitting: Oncology

## 2020-09-07 ENCOUNTER — Inpatient Hospital Stay: Payer: Medicare HMO

## 2020-09-07 VITALS — BP 154/84 | HR 85 | Temp 98.6°F | Resp 16 | Wt 131.3 lb

## 2020-09-07 DIAGNOSIS — E538 Deficiency of other specified B group vitamins: Secondary | ICD-10-CM | POA: Diagnosis not present

## 2020-09-07 DIAGNOSIS — D509 Iron deficiency anemia, unspecified: Secondary | ICD-10-CM

## 2020-09-07 DIAGNOSIS — I10 Essential (primary) hypertension: Secondary | ICD-10-CM | POA: Insufficient documentation

## 2020-09-07 DIAGNOSIS — M8588 Other specified disorders of bone density and structure, other site: Secondary | ICD-10-CM | POA: Diagnosis not present

## 2020-09-07 DIAGNOSIS — Z90710 Acquired absence of both cervix and uterus: Secondary | ICD-10-CM | POA: Diagnosis not present

## 2020-09-07 DIAGNOSIS — E876 Hypokalemia: Secondary | ICD-10-CM | POA: Diagnosis not present

## 2020-09-07 DIAGNOSIS — Z90721 Acquired absence of ovaries, unilateral: Secondary | ICD-10-CM | POA: Diagnosis not present

## 2020-09-07 DIAGNOSIS — Z9012 Acquired absence of left breast and nipple: Secondary | ICD-10-CM | POA: Insufficient documentation

## 2020-09-07 DIAGNOSIS — M8589 Other specified disorders of bone density and structure, multiple sites: Secondary | ICD-10-CM | POA: Insufficient documentation

## 2020-09-07 DIAGNOSIS — Z170 Estrogen receptor positive status [ER+]: Secondary | ICD-10-CM | POA: Insufficient documentation

## 2020-09-07 DIAGNOSIS — Z9079 Acquired absence of other genital organ(s): Secondary | ICD-10-CM | POA: Diagnosis not present

## 2020-09-07 DIAGNOSIS — Z79899 Other long term (current) drug therapy: Secondary | ICD-10-CM | POA: Insufficient documentation

## 2020-09-07 DIAGNOSIS — D0512 Intraductal carcinoma in situ of left breast: Secondary | ICD-10-CM

## 2020-09-07 LAB — CBC WITH DIFFERENTIAL/PLATELET
Abs Immature Granulocytes: 0.03 10*3/uL (ref 0.00–0.07)
Basophils Absolute: 0 10*3/uL (ref 0.0–0.1)
Basophils Relative: 1 %
Eosinophils Absolute: 0.1 10*3/uL (ref 0.0–0.5)
Eosinophils Relative: 1 %
HCT: 35.2 % — ABNORMAL LOW (ref 36.0–46.0)
Hemoglobin: 11.2 g/dL — ABNORMAL LOW (ref 12.0–15.0)
Immature Granulocytes: 1 %
Lymphocytes Relative: 28 %
Lymphs Abs: 1.7 10*3/uL (ref 0.7–4.0)
MCH: 27.1 pg (ref 26.0–34.0)
MCHC: 31.8 g/dL (ref 30.0–36.0)
MCV: 85 fL (ref 80.0–100.0)
Monocytes Absolute: 0.5 10*3/uL (ref 0.1–1.0)
Monocytes Relative: 8 %
Neutro Abs: 3.8 10*3/uL (ref 1.7–7.7)
Neutrophils Relative %: 61 %
Platelets: 131 10*3/uL — ABNORMAL LOW (ref 150–400)
RBC: 4.14 MIL/uL (ref 3.87–5.11)
RDW: 15.6 % — ABNORMAL HIGH (ref 11.5–15.5)
WBC: 6.1 10*3/uL (ref 4.0–10.5)
nRBC: 0 % (ref 0.0–0.2)

## 2020-09-07 LAB — COMPREHENSIVE METABOLIC PANEL
ALT: 14 U/L (ref 0–44)
AST: 18 U/L (ref 15–41)
Albumin: 3.6 g/dL (ref 3.5–5.0)
Alkaline Phosphatase: 44 U/L (ref 38–126)
Anion gap: 7 (ref 5–15)
BUN: 15 mg/dL (ref 8–23)
CO2: 25 mmol/L (ref 22–32)
Calcium: 8.4 mg/dL — ABNORMAL LOW (ref 8.9–10.3)
Chloride: 105 mmol/L (ref 98–111)
Creatinine, Ser: 0.84 mg/dL (ref 0.44–1.00)
GFR, Estimated: >60 mL/min (ref >60)
Glucose, Bld: 109 mg/dL — ABNORMAL HIGH (ref 70–99)
Potassium: 2.6 mmol/L — CL (ref 3.5–5.1)
Sodium: 137 mmol/L (ref 135–145)
Total Bilirubin: 0.6 mg/dL (ref 0.3–1.2)
Total Protein: 6.4 g/dL — ABNORMAL LOW (ref 6.5–8.1)

## 2020-09-07 LAB — IRON AND TIBC
Iron: 89 ug/dL (ref 28–170)
Saturation Ratios: 29 % (ref 10.4–31.8)
TIBC: 304 ug/dL (ref 250–450)
UIBC: 215 ug/dL

## 2020-09-07 LAB — FERRITIN: Ferritin: 86 ng/mL (ref 11–307)

## 2020-09-07 MED ORDER — CYANOCOBALAMIN 1000 MCG/ML IJ SOLN
1000.0000 ug | Freq: Once | INTRAMUSCULAR | Status: AC
  Administered 2020-09-07: 1000 ug via INTRAMUSCULAR

## 2020-09-07 MED ORDER — SODIUM CHLORIDE 0.9 % IV SOLN
INTRAVENOUS | Status: DC | PRN
  Filled 2020-09-07: qty 250

## 2020-09-07 MED ORDER — POTASSIUM CHLORIDE 20 MEQ/100ML IV SOLN
10.0000 meq | Freq: Once | INTRAVENOUS | Status: AC
  Administered 2020-09-07: 10 meq via INTRAVENOUS

## 2020-09-08 ENCOUNTER — Encounter: Payer: Self-pay | Admitting: Urgent Care

## 2020-09-08 NOTE — Progress Notes (Signed)
[facility]  [address], [address] [place], [place] [address] Phone: [phone]  Fax: [phone]   Office Visit:  09/08/2020  Referring physician: [name], MD  Chief Complaint: Ashley Ryan is a 75 y.o. female presents for follow up of left breast DCIS s/p mastectomy and B12 deficiency.  PERTINENT ONCOLOGY HISTORY Ashley Ryan is a 75 y.o.afemale who has above oncology history reviewed by me today presented for follow up visit for management of  left breast DCIS s/p mastectomy and B12 deficiency.  Patient previously followed up by Dr.Corcoran, patient switched care to me on 09/07/20 Extensive medical record review was performed by me  06/13/2017.  left breast DCIS s/p simple mastectomy on  Pathology revealed at least 8 mm of grade III DCIS.  Margins were negative.  One sentinel lymph node was negative.  DCIS was ER was + (> 90%) and PR + (75%).  Pathologic stage was Tis N0.   07/03/2017. tamoxifen   # Bone health.  Osteopenia 08/04/2019 Bone density revealed osteopenia with a T score of -2.1 in the right femoral neck.      # iron deficiency anemia.  history of blood transfusion  EGD on 09/09/2013 revealed Cameron erosions, esophageal stricture s/p Maloney dilatation, and a large sliding hiatal hernia.  EGD on 06/04/2019 revealed a medium-sized hiatal hernia, gastritis, and one duodenal polyp (peptic duodenitis).  Pathology revealed iron pill gastritis and no H pylori, metaplasia, dysplasia or malignancy.  Colonoscopy on 09/09/2013 revealed an 8 mm sessile polyp in the cecum (tubular adenoma).  Colonoscopy on 06/04/2019 revealed two 6 to 8 mm polyps in the transverse colon (tubular adenomas), and one 6 mm polyp in the rectum (tubular adenoma).  Clip (MR conditional) was placed.  There was one 3 mm polyp in the ascending colon (tubular adenoma).  There were non-bleeding internal hemorrhoids and diverticulosis in the sigmoid colon.    INTERVAL  HISTORY Ashley Ryan is a 75 y.o. female who has above history reviewed by me today presents for follow up visit for management of history of DCIS, osteopenia She reports feeling well.  No new breast concerns. She just had mammogram done.  She takes tamoxifen, calcium and vitamin D. Tolerates well.    Past Medical History:  Diagnosis Date   Anemia    Arthritis    Breast cancer ([facility]) 06/13/2017   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  Mastectomy/ SLN.   Chronic headache    Complication of anesthesia    prior to 1991 used to have PONV.  none recently.   Diverticulosis of colon    Family history of adverse reaction to anesthesia    mother/daighter get sick   GERD with stricture    Hard of hearing    Heart murmur    followed by PCP   Hiatal hernia    Hypertension    Neuropathy    feet   Osteoporosis    Pre-diabetes    Wears dentures    full upper    Past Surgical History:  Procedure Laterality Date   ABDOMINAL HYSTERECTOMY  1978   one ovary remains   BREAST BIOPSY Left 05/26/2017   Affirm Bx- coil clip Ductal carcinoma in situ, high nuclear grade with calcifications and focal comedonecrosis   CARDIAC CATHETERIZATION     "yrs ago" all OK.   CATARACT EXTRACTION W/PHACO Left 02/21/2016   Procedure: CATARACT EXTRACTION PHACO AND INTRAOCULAR LENS PLACEMENT (IOC)  Left eye;  Surgeon:  Leandrew Koyanagi, MD;  Location: [facility];  Service: Ophthalmology;  Laterality: Left;  Left eye Diabetic   CATARACT EXTRACTION W/PHACO Right 03/13/2016   Procedure: CATARACT EXTRACTION PHACO AND INTRAOCULAR LENS PLACEMENT ([place])  right  diabetic;  Surgeon: Leandrew Koyanagi, MD;  Location: [place];  Service: Ophthalmology;  Laterality: Right;  diabetic   CHOLECYSTECTOMY  1978   COLONOSCOPY W/ POLYPECTOMY  09/09/2013   8 mm tubular adenoma of the cecum.  Erskine Emery, MD. [facility]   COLONOSCOPY WITH PROPOFOL N/A 06/04/2019   Procedure: COLONOSCOPY WITH  PROPOFOL;  Surgeon: Lucilla Lame, MD;  Location: [facility] ENDOSCOPY;  Service: Endoscopy;  Laterality: N/A;   ESOPHAGOGASTRODUODENOSCOPY (EGD) WITH PROPOFOL N/A 06/04/2019   Procedure: ESOPHAGOGASTRODUODENOSCOPY (EGD) WITH PROPOFOL;  Surgeon: Lucilla Lame, MD;  Location: [facility] ENDOSCOPY;  Service: Endoscopy;  Laterality: N/A;   JOINT REPLACEMENT Left    knee    LEFT HEART CATH AND CORONARY ANGIOGRAPHY N/A 11/06/2017   Procedure: LEFT HEART CATH AND CORONARY ANGIOGRAPHY;  Surgeon: Nelva Bush, MD;  Location: [facility];  Service: Cardiovascular;  Laterality: N/A;   MASTECTOMY Left 06/13/2017   mastectomy neg margins   OOPHORECTOMY     one still left   RENAL ANGIOGRAPHY  11/06/2017   Procedure: RENAL ANGIOGRAPHY;  Surgeon: Nelva Bush, MD;  Location: [facility];  Service: Cardiovascular;;   SENTINEL NODE BIOPSY Left 06/13/2017   Procedure: SENTINEL NODE BIOPSY;  Surgeon: Robert Bellow, MD;  Location: [facility];  Service: General;  Laterality: Left;   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  Surgeon: Robert Bellow, MD;  Location: [facility];  Service: General;  Laterality: Left;    Family History  Problem Relation Age of Onset   Breast cancer Other    Stomach cancer Other    Diabetes Other    Heart disease Other    Breast cancer Sister 53   Esophageal cancer Brother    Hypertension Mother    Heart attack Mother 3   Hypertension Father    Coronary artery disease Father 23       CABG   Stroke Father     Social History:  reports that she has never smoked. She has never used smokeless tobacco. She reports that she does not drink alcohol and does not use drugs. Her husband's name is Scientist, research (medical).  She is a retired [employer] manager.  She lives in [place]. The patient is alone today.   Allergies:  Allergies  Allergen Reactions   Oxycodone Nausea Only   Sulfa Antibiotics Rash    Childhood reaction    Sulfonamide Derivatives Rash    Childhood reaction.    Current Medications: Current Outpatient Medications  Medication Sig Dispense Refill   Alcohol Swabs (B-D SINGLE USE SWABS REGULAR) PADS USE  TO TEST BLOOD SUGAR ONE TIME DAILY 100 each 3   atorvastatin (LIPITOR) 40 MG tablet TAKE 1 TABLET AT BEDTIME 90 tablet 3   Blood Glucose Calibration (ACCU-CHEK AVIVA) SOLN Use to check blood sugar once daily 1 each 0   Blood Glucose Calibration (TRUE METRIX LEVEL 1) Low SOLN USE AS DIRECTED 1 each 3   Blood Glucose Monitoring Suppl (TRUE METRIX AIR GLUCOSE METER) w/Device KIT Use to check blood sugar daily 1 kit 0   Calcium Carbonate-Vitamin D 600-400 MG-UNIT tablet Take 1 tablet by mouth 2 (two) times daily.     diclofenac Sodium (VOLTAREN)  1 % GEL APPLY 2 GRAMS  TOPICALLY TO THE AFFECTED AREA 4 TIMES DAILY. 700 g 11   esomeprazole (NEXIUM) 40 MG capsule TAKE 1 CAPSULE 2 (TWO) TIMES DAILY BEFORE A MEAL. 180 capsule 1   FLUoxetine (PROZAC) 20 MG capsule TAKE 1 CAPSULE EVERY DAY 90 capsule 1   fluticasone (FLONASE) 50 MCG/ACT nasal spray USE 2 SPRAYS INTO THE NOSE DAILY AS DIRECTED (SUBSTITUTED FOR FLONASE) 48 g 3   gabapentin (NEURONTIN) 100 MG capsule TAKE 1 CAPSULE IN THE MORNING, 1 CAPSULE AT LUNCH, 1 CAPSULES AT DINNER AND 2 TO 3 CAPSULES AT BEDTIME 540 capsule 5   Glucosamine 500 MG TABS Take 500 mg by mouth daily.     glucose blood (TRUE METRIX BLOOD GLUCOSE TEST) test strip Use to check blood sugar daily 100 each 3   isosorbide mononitrate (IMDUR) 30 MG 24 hr tablet TAKE 1 TABLET EVERY DAY 90 tablet 3   KLOR-CON M20 20 MEQ tablet TAKE 1 TABLET BY MOUTH EVERY DAY 90 tablet 1   latanoprost (XALATAN) 0.005 % ophthalmic solution Place 1 drop into both eyes at bedtime.     lidocaine (LIDODERM) 5 % APPLY 1 PATCH ONTO THE SKIN EVERY DAY. REMOVE AND DISCARD PATCH WITHIN 12 HOURS OR AS DIRECTED BY PHYSICIAN. 90 patch 3   loratadine (CLARITIN) 10 MG tablet Take 10 mg by mouth daily.      losartan-hydrochlorothiazide (HYZAAR) 100-25 MG tablet Take 1 tablet by mouth daily.     Omega-3 Fatty Acids (FISH OIL) 1000 MG CAPS Take 1,000 mg by mouth daily.      polycarbophil (FIBERCON) 625 MG tablet Take 625 mg by mouth daily.     tamoxifen (NOLVADEX) 20 MG tablet Take 1 tablet (20 mg total) by mouth daily. 90 tablet 0   tolterodine (DETROL LA) 4 MG 24 hr capsule TAKE 1 CAPSULE BY MOUTH EVERY DAY 90 capsule 3   traMADol (ULTRAM) 50 MG tablet TAKE 1 TABLET EVERY 6 HOURS AS NEEDED FOR PAIN 90 tablet 0   triamcinolone cream (KENALOG) 0.5 % Apply 1 application topically 2 (two) times daily. 30 g 0   TRUEplus Lancets 28G MISC TEST BLOOD SUGAR EVERY DAY 100 each 3   furosemide (LASIX) 20 MG tablet TAKE 1 TABLET BY MOUTH EVERY DAY AS NEEDED (Patient not taking: Reported on 09/07/2020) 30 tablet 5   No current facility-administered medications for this visit.    Review of Systems  Constitutional:  Negative for chills, diaphoresis, fever, malaise/fatigue and weight loss.  HENT: Negative.  Negative for congestion, ear discharge, ear pain, hearing loss, nosebleeds, sinus pain, sore throat and tinnitus.   Eyes: Negative.  Negative for blurred vision.  Respiratory:  Positive for shortness of breath (occasional). Negative for cough, hemoptysis and sputum production.   Cardiovascular: Negative.  Negative for chest pain, palpitations and leg swelling.  Gastrointestinal:  Negative for abdominal pain, blood in stool, constipation, diarrhea, heartburn, melena, nausea and vomiting.  Genitourinary: Negative.  Negative for dysuria, frequency, hematuria and urgency.  Musculoskeletal:  Positive for joint pain (knees). Negative for back pain, myalgias and neck pain.  Skin: Negative.  Negative for itching and rash.  Neurological: Negative.  Negative for dizziness, tingling, sensory change, weakness and headaches.  Endo/Heme/Allergies: Negative.  Does not bruise/bleed easily.  Psychiatric/Behavioral: Negative.   Negative for depression and memory loss. The patient is not nervous/anxious and does not have insomnia.   All other systems reviewed and are negative. Performance status (ECOG): 1  Vital Signs  Blood pressure (!) 154/84, pulse 85, temperature 98.6 F (37 C), resp. rate 16, weight 131 lb 4.5 oz (59.5 kg), SpO2 100 %.  Physical Exam Constitutional:      General: She is not in acute distress.    Appearance: She is not diaphoretic.     Comments: Thin, ambulates independantly  HENT:     Head: Normocephalic and atraumatic.     Nose: Nose normal.     Mouth/Throat:     Pharynx: No oropharyngeal exudate.  Eyes:     General: No scleral icterus.    Pupils: Pupils are equal, round, and reactive to light.  Cardiovascular:     Rate and Rhythm: Normal rate and regular rhythm.     Heart sounds: No murmur heard. Pulmonary:     Effort: Pulmonary effort is normal. No respiratory distress.     Breath sounds: No rales.  Chest:     Chest wall: No tenderness.  Abdominal:     General: There is no distension.     Palpations: Abdomen is soft.     Tenderness: There is no abdominal tenderness.  Musculoskeletal:        General: Normal range of motion.     Cervical back: Normal range of motion and neck supple.  Skin:    General: Skin is warm and dry.     Findings: No erythema.  Neurological:     Mental Status: She is alert and oriented to person, place, and time.     Cranial Nerves: No cranial nerve deficit.     Motor: No abnormal muscle tone.     Coordination: Coordination normal.  Psychiatric:        Mood and Affect: Affect normal.    Laboratory studies.  CBC    Component Value Date/Time   WBC 6.1 09/07/2020 1313   RBC 4.14 09/07/2020 1313   HGB 11.2 (L) 09/07/2020 1313   HGB 8.6 (L) 11/27/2013 0509   HCT 35.2 (L) 09/07/2020 1313   HCT 27.1 (L) 11/27/2013 0509   PLT 131 (L) 09/07/2020 1313   PLT 112 (L) 11/27/2013 0509   MCV 85.0 09/07/2020 1313   MCV 80 11/27/2013 0509   MCH 27.1  09/07/2020 1313   MCHC 31.8 09/07/2020 1313   RDW 15.6 (H) 09/07/2020 1313   RDW 20.3 (H) 11/27/2013 0509   LYMPHSABS 1.7 09/07/2020 1313   LYMPHSABS 0.9 (L) 11/27/2013 0509   MONOABS 0.5 09/07/2020 1313   MONOABS 0.5 11/27/2013 0509   EOSABS 0.1 09/07/2020 1313   EOSABS 0.1 11/27/2013 0509   BASOSABS 0.0 09/07/2020 1313   BASOSABS 0.0 11/27/2013 0509   CMP Latest Ref Rng & Units 09/07/2020 07/27/2020 07/25/2020  Glucose 70 - 99 mg/dL 109(H) - 86  BUN 8 - 23 mg/dL 15 - 13  Creatinine 0.44 - 1.00 mg/dL 0.84 - 0.86  Sodium 135 - 145 mmol/L 137 - 140  Potassium 3.5 - 5.1 mmol/L 2.6(LL) 4.3 3.0(L)  Chloride 98 - 111 mmol/L 105 - 103  CO2 22 - 32 mmol/L 25 - 28  Calcium 8.9 - 10.3 mg/dL 8.4(L) - 8.4  Total Protein 6.5 - 8.1 g/dL 6.4(L) - 5.8(L)  Total Bilirubin 0.3 - 1.2 mg/dL 0.6 - 0.3  Alkaline Phos 38 - 126 U/L 44 - 43  AST 15 - 41 U/L 18 - 12  ALT 0 - 44 U/L 14 - 8      Assessment and plan.   1. Ductal carcinoma in situ (DCIS) of left breast  2. Osteopenia of spine   3. B12 deficiency   4. Iron deficiency anemia, unspecified iron deficiency anemia type   5. Hypokalemia      # Left breast high grade DCIS, s/p mastectomy- 05/2017 Clinically she is doing well.  Right screening mammogram was reviewed and discussed with patient.  Continue annual screening.  Continue Tamoxifen, plan 5 years.- till June 2025   # Osteopenia Continue calcium and vitamin D  DEXA July 2023  # B12 deficiency She has been on B12 IM injection monthly.  B12 was not check for this visit - lab was ordered by Dr.Corcoran prior to this visit.  Check B12 level at next visit.   #Hypokalemia 2.6, she gets IV KCL 35mq x 1 today, advise her to take potassium chloride 239m twice a day. She has supply at home. I communicated with PCP Dr.Bedsole who will arrange follow up lab to monitor K level.   # History of  Iron deficiency 09/07/2020 Ferritin 86, iron saturation 29, stable.   # Monthly B12 injections  x 5.  RTC in 6 months for MD assessment, labs (CBC with diff, CMP, ferritin, iron studies, B12 ), +/- B12.  I discussed the assessment and treatment plan with the patient.  The patient was provided an opportunity to ask questions and all were answered.  The patient agreed with the plan and demonstrated an understanding of the instructions.  The patient was advised to call back if the symptoms worsen or if the condition fails to improve as anticipated.  We spent sufficient time to discuss many aspect of care, questions were answered to patient's satisfaction. A total of 40 minutes was spent on this visit.  With 15 minutes spent reviewing image findings, pathology reports, 20 minutes counseling the patient on the diagnosis, treatment plan, side effects of the treatment, surveillance plan, communicate with primary care provider.  Additional 5 minutes was spent on answering patient's questions.   ZhEarlie ServerMD, PhD Hematology Oncology [facility]/19/2022

## 2020-09-12 NOTE — Telephone Encounter (Signed)
Received the benefits outcome (medical and pharmacy) along with PA form for completion from [facility].  I have faxed the paperwork to [facility], who is taking over processing of the visco/durolane knee injection approval process.   She will respond back to me once she has completed the PA so we can get this completed and reviewed/scheduled with the patient as soon as possible.

## 2020-09-13 ENCOUNTER — Telehealth: Payer: Self-pay | Admitting: Family Medicine

## 2020-09-13 DIAGNOSIS — E538 Deficiency of other specified B group vitamins: Secondary | ICD-10-CM

## 2020-09-13 DIAGNOSIS — E876 Hypokalemia: Secondary | ICD-10-CM

## 2020-09-13 DIAGNOSIS — E1140 Type 2 diabetes mellitus with diabetic neuropathy, unspecified: Secondary | ICD-10-CM

## 2020-09-13 NOTE — Telephone Encounter (Signed)
-----   Message from Ellamae Sia sent at 09/12/2020 11:38 AM EDT ----- Regarding: Lab orders for Thursday, 8.25.22 Lab orders for repeat labs, thanks

## 2020-09-14 ENCOUNTER — Other Ambulatory Visit: Payer: Medicare HMO

## 2020-09-14 ENCOUNTER — Other Ambulatory Visit: Payer: Self-pay

## 2020-09-14 ENCOUNTER — Other Ambulatory Visit (INDEPENDENT_AMBULATORY_CARE_PROVIDER_SITE_OTHER): Payer: Medicare HMO

## 2020-09-14 DIAGNOSIS — E876 Hypokalemia: Secondary | ICD-10-CM | POA: Diagnosis not present

## 2020-09-14 LAB — BASIC METABOLIC PANEL
BUN: 20 mg/dL (ref 6–23)
CO2: 25 mEq/L (ref 19–32)
Calcium: 9 mg/dL (ref 8.4–10.5)
Chloride: 101 mEq/L (ref 96–112)
Creatinine, Ser: 0.85 mg/dL (ref 0.40–1.20)
GFR: 66.14 mL/min (ref >60.00)
Glucose, Bld: 85 mg/dL (ref 70–99)
Potassium: 4.5 mEq/L (ref 3.5–5.1)
Sodium: 135 mEq/L (ref 135–145)

## 2020-09-14 NOTE — Telephone Encounter (Addendum)
PA initiated via [email or web address]

## 2020-09-16 NOTE — Telephone Encounter (Signed)
Pt ready for scheduling  Out-of-pocket cost: approximately $125.50  Primary: [employer] co-insurance: 20% (approximately $105.50) Admin fee co-insurance: $20  Secondary: n/a [employer] co-insurance:  Admin fee co-insurance:   Deductible: does not apply  Prior Auth: APPROVED PA# [id number] Valid: 09/14/20    ** This summary of benefits is an estimation of the patient's out-of-pocket cost. Exact cost may very based on individual plan coverage.

## 2020-09-20 NOTE — Telephone Encounter (Signed)
I did clarify the cost/approval below is for orthovisc.    Will route to Hedy Camara, CMA to reach out to patient to work on ordering med and getting her scheduled if she is agreeable to the cost.   Thanks!

## 2020-09-21 NOTE — Telephone Encounter (Signed)
[name] notified as instructed by telephone.  She states she can't afford $125.50 for the Orthovisc.  While on the phone, she ask if she should go back to taking 1 tablet daily of potassium since her level is back in normal range.  She is currently taking 2 tablets daily.  Spoke with Dr. Diona Browner.  She wants patient to go back to 1 tablet of K+ daily.  Return in 2 weeks for labs and then follow up with Dr. Diona Browner a couple days later after labs.  Patient is not at home so she will call back to schedule appointments.

## 2020-09-28 ENCOUNTER — Other Ambulatory Visit: Payer: Self-pay | Admitting: Family Medicine

## 2020-09-30 ENCOUNTER — Other Ambulatory Visit: Payer: Self-pay | Admitting: Family Medicine

## 2020-10-06 ENCOUNTER — Inpatient Hospital Stay: Payer: Medicare HMO | Attending: Nurse Practitioner

## 2020-10-06 ENCOUNTER — Other Ambulatory Visit: Payer: Self-pay

## 2020-10-06 DIAGNOSIS — E538 Deficiency of other specified B group vitamins: Secondary | ICD-10-CM | POA: Diagnosis not present

## 2020-10-06 MED ORDER — CYANOCOBALAMIN 1000 MCG/ML IJ SOLN
1000.0000 ug | Freq: Once | INTRAMUSCULAR | Status: AC
  Administered 2020-10-06: 1000 ug via INTRAMUSCULAR

## 2020-10-11 DIAGNOSIS — M1711 Unilateral primary osteoarthritis, right knee: Secondary | ICD-10-CM | POA: Diagnosis not present

## 2020-10-12 ENCOUNTER — Inpatient Hospital Stay: Payer: Medicare HMO

## 2020-10-24 ENCOUNTER — Other Ambulatory Visit: Payer: Self-pay

## 2020-10-24 MED ORDER — GABAPENTIN 100 MG PO CAPS: ORAL_CAPSULE | ORAL | 0 refills | Status: DC

## 2020-10-24 NOTE — Telephone Encounter (Signed)
Refill request from [facility] order pharmacy. Last refilled on 11/30/19 # 540, rf 5.  LOV 08/23/20 Knee pain-with Dr Lorelei Pont Next appointment on 01/30/21 CPE

## 2020-11-09 ENCOUNTER — Inpatient Hospital Stay: Payer: Medicare HMO | Attending: Nurse Practitioner

## 2020-12-07 ENCOUNTER — Inpatient Hospital Stay: Payer: Medicare HMO | Attending: Nurse Practitioner

## 2021-01-04 ENCOUNTER — Inpatient Hospital Stay: Payer: Medicare HMO

## 2021-01-04 ENCOUNTER — Other Ambulatory Visit: Payer: Self-pay

## 2021-01-04 ENCOUNTER — Inpatient Hospital Stay: Payer: Medicare HMO | Attending: Nurse Practitioner

## 2021-01-04 DIAGNOSIS — E538 Deficiency of other specified B group vitamins: Secondary | ICD-10-CM | POA: Diagnosis not present

## 2021-01-04 MED ORDER — CYANOCOBALAMIN 1000 MCG/ML IJ SOLN
1000.0000 ug | Freq: Once | INTRAMUSCULAR | Status: AC
  Administered 2021-01-04: 1000 ug via INTRAMUSCULAR

## 2021-01-11 DIAGNOSIS — H353131 Nonexudative age-related macular degeneration, bilateral, early dry stage: Secondary | ICD-10-CM | POA: Diagnosis not present

## 2021-01-11 DIAGNOSIS — H401112 Primary open-angle glaucoma, right eye, moderate stage: Secondary | ICD-10-CM | POA: Diagnosis not present

## 2021-01-11 DIAGNOSIS — H524 Presbyopia: Secondary | ICD-10-CM | POA: Diagnosis not present

## 2021-01-11 LAB — HM DIABETES EYE EXAM

## 2021-01-30 ENCOUNTER — Ambulatory Visit (INDEPENDENT_AMBULATORY_CARE_PROVIDER_SITE_OTHER): Payer: Medicare HMO | Admitting: Family Medicine

## 2021-01-30 ENCOUNTER — Other Ambulatory Visit: Payer: Self-pay

## 2021-01-30 ENCOUNTER — Encounter: Payer: Self-pay | Admitting: Family Medicine

## 2021-01-30 VITALS — BP 120/70 | HR 81 | Temp 98.5°F | Ht 59.65 in | Wt 127.4 lb

## 2021-01-30 DIAGNOSIS — Z23 Encounter for immunization: Secondary | ICD-10-CM

## 2021-01-30 DIAGNOSIS — E1169 Type 2 diabetes mellitus with other specified complication: Secondary | ICD-10-CM

## 2021-01-30 DIAGNOSIS — I152 Hypertension secondary to endocrine disorders: Secondary | ICD-10-CM

## 2021-01-30 DIAGNOSIS — E538 Deficiency of other specified B group vitamins: Secondary | ICD-10-CM | POA: Diagnosis not present

## 2021-01-30 DIAGNOSIS — F330 Major depressive disorder, recurrent, mild: Secondary | ICD-10-CM

## 2021-01-30 DIAGNOSIS — D0512 Intraductal carcinoma in situ of left breast: Secondary | ICD-10-CM

## 2021-01-30 DIAGNOSIS — Z0000 Encounter for general adult medical examination without abnormal findings: Secondary | ICD-10-CM | POA: Diagnosis not present

## 2021-01-30 DIAGNOSIS — E1140 Type 2 diabetes mellitus with diabetic neuropathy, unspecified: Secondary | ICD-10-CM

## 2021-01-30 DIAGNOSIS — E785 Hyperlipidemia, unspecified: Secondary | ICD-10-CM

## 2021-01-30 DIAGNOSIS — I272 Pulmonary hypertension, unspecified: Secondary | ICD-10-CM

## 2021-01-30 DIAGNOSIS — E876 Hypokalemia: Secondary | ICD-10-CM | POA: Diagnosis not present

## 2021-01-30 DIAGNOSIS — E1142 Type 2 diabetes mellitus with diabetic polyneuropathy: Secondary | ICD-10-CM

## 2021-01-30 DIAGNOSIS — E1159 Type 2 diabetes mellitus with other circulatory complications: Secondary | ICD-10-CM | POA: Diagnosis not present

## 2021-01-30 DIAGNOSIS — I5032 Chronic diastolic (congestive) heart failure: Secondary | ICD-10-CM

## 2021-01-30 LAB — COMPREHENSIVE METABOLIC PANEL
ALT: 13 U/L (ref 0–35)
AST: 16 U/L (ref 0–37)
Albumin: 4 g/dL (ref 3.5–5.2)
Alkaline Phosphatase: 48 U/L (ref 39–117)
BUN: 15 mg/dL (ref 6–23)
CO2: 28 mEq/L (ref 19–32)
Calcium: 9.2 mg/dL (ref 8.4–10.5)
Chloride: 100 mEq/L (ref 96–112)
Creatinine, Ser: 0.71 mg/dL (ref 0.40–1.20)
GFR: 81.86 mL/min (ref >60.00)
Glucose, Bld: 93 mg/dL (ref 70–99)
Potassium: 3.3 mEq/L — ABNORMAL LOW (ref 3.5–5.1)
Sodium: 137 mEq/L (ref 135–145)
Total Bilirubin: 0.4 mg/dL (ref 0.2–1.2)
Total Protein: 6.5 g/dL (ref 6.0–8.3)

## 2021-01-30 LAB — LIPID PANEL
Cholesterol: 123 mg/dL (ref 0–200)
HDL: 56.3 mg/dL (ref >39.00)
LDL Cholesterol: 46 mg/dL (ref 0–99)
NonHDL: 66.35
Total CHOL/HDL Ratio: 2
Triglycerides: 101 mg/dL (ref 0.0–149.0)
VLDL: 20.2 mg/dL (ref 0.0–40.0)

## 2021-01-30 LAB — HM DIABETES FOOT EXAM

## 2021-01-30 LAB — HEMOGLOBIN A1C: Hgb A1c MFr Bld: 6.2 % (ref 4.6–6.5)

## 2021-01-30 NOTE — Assessment & Plan Note (Signed)
Stable, chronic.  Continue current medication.    gabapentin 100 mg 1/1/1/2-3 tabs at bedtime

## 2021-01-30 NOTE — Assessment & Plan Note (Signed)
Due for re-eval.   Compliant with atorvastatin 40 mg daily 

## 2021-01-30 NOTE — Assessment & Plan Note (Signed)
Due for re-eval. 

## 2021-01-30 NOTE — Assessment & Plan Note (Signed)
Followed by cardiology 

## 2021-01-30 NOTE — Progress Notes (Signed)
Patient ID: Ashley Ryan, female    DOB: 12/20/1945, 76 y.o.   MRN: [id number]  This visit was conducted in person.  BP 120/70    Pulse 81    Temp 98.5 F (36.9 C) (Temporal)    Ht 4' 11.65" (1.515 m)    Wt 127 lb 6 oz (57.8 kg)    SpO2 96%    BMI 25.17 kg/m    CC:  Chief Complaint  Patient presents with   Medicare Wellness    Subjective:   HPI: Ashley Ryan is a 76 y.o. female presenting on 01/30/2021 for Medicare Wellness  The patient presents for annual medicare wellness, complete physical and review of chronic health problems. He/She also has the following acute concerns today:  I have personally reviewed the Medicare Annual Wellness questionnaire and have noted 1. The patient's medical and social history 2. Their use of alcohol, tobacco or illicit drugs 3. Their current medications and supplements 4. The patient's functional ability including ADL's, fall risks, home safety risks and hearing or visual             impairment. 5. Diet and physical activities 6. Evidence for depression or mood disorders 7.         Updated provider list Cognitive evaluation was performed and recorded on pt medicare questionnaire form. The patients weight, height, BMI and visual acuity have been recorded in the chart   I have made referrals, counseling and provided education to the patient based review of the above and I have provided the pt with a written personalized care plan for preventive services.   Documentation of this information was scanned into the electronic record under the media tab.    Advance directives and end of life planning reviewed in detail with patient and documented in EMR. Patient given handout on advance care directives if needed. HCPOA and living will updated if needed.  Hearing Screening - Comments:: Wears Bilateral Hearing Aides Vision Screening - Comments:: Wears Glasses-Eye Exam at [facility] 12/2020  Fall Risk 06/04/2019 08/09/2019 01/28/2020 03/08/2020  01/30/2021  Falls in the past year? - - 1 - 1  Was there an injury with Fall? - - 0 - 0  Fall Risk Category Calculator - - 2 - 2  Fall Risk Category - - Moderate - Moderate  Patient Fall Risk Level Moderate fall risk Low fall risk - Low fall risk -  Patient at Risk for Falls Due to - - - - -  Patient at Risk for Falls Due to - - - - -  Fall risk Follow up - - - - -  Frequent falls addressed and safety precautions reviewed. Was due to tripping on old shoes, has new ones now.  See PHQ entry in Epic from today.   Right knee OA: better s/p steroind injection per Dr. Sharlet Salina.   Dr. 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 Dr. Tasia Catchings plan DEXA 06/2021  Diabetes:  Due for re-eval. Using medications without difficulties: Hypoglycemic episodes: Hyperglycemic episodes: Feet problems:none Blood Sugars averaging: eye exam within last year:  Had in 12/2020.. requested results  High cholesterol: Due for re-eval.    Hypertension:   Good control on ? How much losartan HCTZ ,she thinks she is taking a whole one, but has not taken today  Using lasix  rarely prn,. BP Readings from Last 3 Encounters:  01/30/21 120/70  09/07/20 (!) 154/84  08/23/20 130/70  Using medication without problems or lightheadedness:  none Chest pain with exertion: none Edema: none Short of breath: none Average home BPs: Other issues:  HFpEF stable, pulmonary hypertension: Dr End follows... appt comin up.  Relevant past medical, surgical, family and social history reviewed and updated as indicated. Interim medical history since our last visit reviewed. Allergies and medications reviewed and updated. Outpatient Medications Prior to Visit  Medication Sig Dispense Refill   Alcohol Swabs (B-D SINGLE USE SWABS REGULAR) PADS USE  TO TEST BLOOD SUGAR ONE TIME DAILY  100 each 3   atorvastatin (LIPITOR) 40 MG tablet TAKE 1 TABLET AT BEDTIME 90 tablet 3   Blood Glucose Calibration (ACCU-CHEK AVIVA) SOLN Use to check blood sugar once daily 1 each 0   Blood Glucose Calibration (TRUE METRIX LEVEL 1) Low SOLN USE AS DIRECTED 1 each 3   Blood Glucose Monitoring Suppl (TRUE METRIX AIR GLUCOSE METER) w/Device KIT Use to check blood sugar daily 1 kit 0   Calcium Carbonate-Vitamin D 600-400 MG-UNIT tablet Take 1 tablet by mouth 2 (two) times daily.     diclofenac Sodium (VOLTAREN) 1 % GEL APPLY 2 GRAMS  TOPICALLY TO THE AFFECTED AREA 4 TIMES DAILY. 700 g 11   esomeprazole (NEXIUM) 40 MG capsule TAKE 1 CAPSULE 2 (TWO) TIMES DAILY BEFORE A MEAL. 180 capsule 1   FLUoxetine (PROZAC) 20 MG capsule TAKE 1 CAPSULE EVERY DAY 90 capsule 1   fluticasone (FLONASE) 50 MCG/ACT nasal spray USE 2 SPRAYS INTO THE NOSE DAILY AS DIRECTED (SUBSTITUTED FOR FLONASE) 48 g 3   furosemide (LASIX) 20 MG tablet TAKE 1 TABLET BY MOUTH EVERY DAY AS NEEDED 30 tablet 5   gabapentin (NEURONTIN) 100 MG capsule TAKE 1 CAPSULE IN THE MORNING, 1 CAPSULE AT LUNCH, 1 CAPSULES AT DINNER AND 2 TO 3 CAPSULES AT BEDTIME 540 capsule 0   Glucosamine 500 MG TABS Take 500 mg by mouth daily.     glucose blood (TRUE METRIX BLOOD GLUCOSE TEST) test strip Use to check blood sugar daily 100 each 3   isosorbide mononitrate (IMDUR) 30 MG 24 hr tablet TAKE 1 TABLET EVERY DAY 90 tablet 3   KLOR-CON M20 20 MEQ tablet TAKE 1 TABLET BY MOUTH EVERY DAY 90 tablet 1   latanoprost (XALATAN) 0.005 % ophthalmic solution Place 1 drop into both eyes at bedtime.     lidocaine (LIDODERM) 5 % APPLY 1 PATCH ONTO THE SKIN EVERY DAY. REMOVE AND DISCARD PATCH WITHIN 12 HOURS OR AS DIRECTED BY PHYSICIAN. 90 patch 3   loratadine (CLARITIN) 10 MG tablet Take 10 mg by mouth daily.     losartan-hydrochlorothiazide (HYZAAR) 100-25 MG tablet Take 1 tablet by mouth daily.     Omega-3 Fatty Acids (FISH OIL) 1000 MG CAPS Take 1,000 mg by mouth daily.       polycarbophil (FIBERCON) 625 MG tablet Take 625 mg by mouth daily.     tamoxifen (NOLVADEX) 20 MG tablet Take 1 tablet (20 mg total) by mouth daily. 90 tablet 0   tolterodine (DETROL LA) 4 MG 24 hr capsule TAKE 1 CAPSULE BY MOUTH EVERY DAY 90 capsule 3   traMADol (ULTRAM) 50 MG tablet TAKE 1 TABLET EVERY 6 HOURS AS NEEDED FOR PAIN 90 tablet 0   triamcinolone cream (KENALOG) 0.5 % Apply 1 application topically 2 (two) times daily. 30 g 0  TRUEplus Lancets 28G MISC TEST BLOOD SUGAR EVERY DAY 100 each 3   No facility-administered medications prior to visit.     Per HPI unless specifically indicated in ROS section below Review of Systems  Constitutional:  Negative for fatigue and fever.  HENT:  Negative for congestion.   Eyes:  Negative for pain.  Respiratory:  Negative for cough and shortness of breath.   Cardiovascular:  Negative for chest pain, palpitations and leg swelling.  Gastrointestinal:  Negative for abdominal pain.  Genitourinary:  Negative for dysuria and vaginal bleeding.  Musculoskeletal:  Positive for back pain.  Neurological:  Negative for syncope, light-headedness and headaches.  Psychiatric/Behavioral:  Negative for dysphoric mood.   Objective:  BP 120/70    Pulse 81    Temp 98.5 F (36.9 C) (Temporal)    Ht 4' 11.65" (1.515 m)    Wt 127 lb 6 oz (57.8 kg)    SpO2 96%    BMI 25.17 kg/m   Wt Readings from Last 3 Encounters:  01/30/21 127 lb 6 oz (57.8 kg)  09/07/20 131 lb 4.5 oz (59.5 kg)  08/23/20 136 lb 8 oz (61.9 kg)   Body mass index is 25.17 kg/m.    Physical Exam Vitals and nursing note reviewed.  Constitutional:      General: She is not in acute distress.    Appearance: Normal appearance. She is well-developed. She is not ill-appearing or toxic-appearing.  HENT:     Head: Normocephalic.     Right Ear: Hearing, tympanic membrane, ear canal and external ear normal.     Left Ear: Hearing, tympanic membrane, ear canal and external ear normal.     Nose:  Nose normal.  Eyes:     General: Lids are normal. Lids are everted, no foreign bodies appreciated.     Conjunctiva/sclera: Conjunctivae normal.     Pupils: Pupils are equal, round, and reactive to light.  Neck:     Thyroid: No thyroid mass or thyromegaly.     Vascular: No carotid bruit.     Trachea: Trachea normal.  Cardiovascular:     Rate and Rhythm: Normal rate and regular rhythm.     Heart sounds: S1 normal and S2 normal. Murmur heard.  Systolic murmur is present.    No gallop.  Pulmonary:     Effort: Pulmonary effort is normal. No respiratory distress.     Breath sounds: Normal breath sounds. No wheezing, rhonchi or rales.  Abdominal:     General: Bowel sounds are normal. There is no distension or abdominal bruit.     Palpations: Abdomen is soft. There is no fluid wave or mass.     Tenderness: There is no abdominal tenderness. There is no guarding or rebound.     Hernia: No hernia is present.  Musculoskeletal:     Cervical back: Normal range of motion and neck supple.  Lymphadenopathy:     Cervical: No cervical adenopathy.  Skin:    General: Skin is warm and dry.     Findings: No rash.  Neurological:     Mental Status: She is alert.     Cranial Nerves: No cranial nerve deficit.     Sensory: No sensory deficit.  Psychiatric:        Mood and Affect: Mood is not anxious or depressed.        Speech: Speech normal.        Behavior: Behavior normal. Behavior is cooperative.        Judgment:  Judgment normal.    Diabetic foot exam: Normal inspection No skin breakdown No calluses  Normal DP pulses No sensation to light touch and monofilament bilateral. Nails normal     Results for orders placed or performed in visit on 46/80/32  Basic Metabolic Panel  Result Value Ref Range   Sodium 135 135 - 145 mEq/L   Potassium 4.5 3.5 - 5.1 mEq/L   Chloride 101 96 - 112 mEq/L   CO2 25 19 - 32 mEq/L   Glucose, Bld 85 70 - 99 mg/dL   BUN 20 6 - 23 mg/dL   Creatinine, Ser 0.85  0.40 - 1.20 mg/dL   GFR 66.14 >60.00 mL/min   Calcium 9.0 8.4 - 10.5 mg/dL    This visit occurred during the SARS-CoV-2 public health emergency.  Safety protocols were in place, including screening questions prior to the visit, additional usage of staff PPE, and extensive cleaning of exam room while observing appropriate contact time as indicated for disinfecting solutions.   COVID 19 screen:  No recent travel or known exposure to COVID19 The patient denies respiratory symptoms of COVID 19 at this time. The importance of social distancing was discussed today.   Assessment and Plan The patient's preventative maintenance and recommended screening tests for an annual wellness exam were reviewed in full today. Brought up to date unless services declined.  Counselled on the importance of diet, exercise, and its role in overall health and mortality. The patient's FH and SH was reviewed, including their home life, tobacco status, and drug and alcohol status.   Vaccines: uptodate with COVID x 2, encouraged booster. Given HD flu shot today. Consider shingrix.  DEXA: Has stomach ulcer 11/2013 on PPI nexium. SE to fosamax 11/2014:  osteoprosis Started Boniva as of 10/2015, now off.   osteoporosis  Improved to osteopenia 2019,  due for re-eval 2023 per Dr Tasia Catchings. Colon: 05/2019,  Dr. Radford Pax, repeat  5 year. Mammo: breast cancer, left, s/p left mastectomy. 07/2020 normal right mammo PAP/DVE: partial hysterectomy, no pap smear or DVE required,. No family history of ovarian cancer.    Hep C: neg   Problem List Items Addressed This Visit     (HFpEF) heart failure with preserved ejection fraction (HCC) (Chronic)    Stable, chronic.  Continue current medication.     Euvolemic. Lasix prn.        B12 deficiency (Chronic)    Followed by oncology.      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      Relevant Orders   Hemoglobin A1c   Lipid panel    Comprehensive metabolic panel   Diabetic peripheral neuropathy (HCC) (Chronic)    Stable, chronic.  Continue current medication.    gabapentin 100 mg 1/1/1/2-3 tabs at bedtime      Hyperlipidemia associated with type 2 diabetes mellitus (HCC) (Chronic)    Due for re-eval.   Compliant with atorvastatin 40 mg daily      MDD (major depressive disorder), mild (HCC) (Chronic)    Stable, chronic.  Continue current medication.   prozac 20 mg daily      Pulmonary hypertension (HCC) (Chronic)    Followed by cardiology.      Ductal carcinoma in situ (DCIS) of left breast    Followed by Dr. Tasia Catchings oncology. On tamoxifen until 2023.      Hypertension associated with diabetes (HCC)    Stable, chronic.  Continue current medication.  Losartan HCTZ 100/25 mg daily      Hypokalemia    In past noted secondary to HCTZ... due for re-eval on Klor Con 20      Other Visit Diagnoses     Medicare annual wellness visit, subsequent    -  Primary   Need for influenza vaccination       Relevant Orders   Flu Vaccine QUAD High Dose(Fluad) (Completed)        Eliezer Lofts, MD

## 2021-01-30 NOTE — Assessment & Plan Note (Signed)
Followed by Dr. Tasia Catchings oncology. On tamoxifen until 2023.

## 2021-01-30 NOTE — Assessment & Plan Note (Signed)
Followed by oncology 

## 2021-01-30 NOTE — Assessment & Plan Note (Signed)
In past noted secondary to HCTZ... due for re-eval on Klor Con 20

## 2021-01-30 NOTE — Assessment & Plan Note (Signed)
Stable, chronic.  Continue current medication.   prozac 20 mg daily

## 2021-01-30 NOTE — Assessment & Plan Note (Addendum)
Stable, chronic.  Continue current medication.   Losartan HCTZ 100/25 mg daily 

## 2021-01-30 NOTE — Assessment & Plan Note (Signed)
Stable, chronic.  Continue current medication.     Euvolemic. Lasix prn.   

## 2021-01-30 NOTE — Patient Instructions (Signed)
Please stop at the lab to have labs drawn.  

## 2021-01-31 ENCOUNTER — Other Ambulatory Visit: Payer: Self-pay | Admitting: Family Medicine

## 2021-01-31 ENCOUNTER — Other Ambulatory Visit: Payer: Self-pay | Admitting: Oncology

## 2021-01-31 NOTE — Telephone Encounter (Signed)
Last office visit 01/30/2021 for [place].  Last refilled Gabapentin 10/24/2020 for #540 with no refills.  Tramadol 05/30/20 for #90 with no refills.  Lidocaine Patch 11/12/2019 for #90 with 3 refills.  Next Appt: 07/31/2021 for 6 month follow up.

## 2021-02-08 ENCOUNTER — Other Ambulatory Visit: Payer: Self-pay

## 2021-02-08 ENCOUNTER — Inpatient Hospital Stay: Payer: Medicare HMO | Attending: Oncology

## 2021-02-08 ENCOUNTER — Inpatient Hospital Stay: Payer: Medicare HMO

## 2021-02-08 DIAGNOSIS — E538 Deficiency of other specified B group vitamins: Secondary | ICD-10-CM | POA: Diagnosis not present

## 2021-02-08 MED ORDER — CYANOCOBALAMIN 1000 MCG/ML IJ SOLN
1000.0000 ug | Freq: Once | INTRAMUSCULAR | Status: AC
  Administered 2021-02-08: 1000 ug via INTRAMUSCULAR
  Filled 2021-02-08: qty 1

## 2021-02-09 DIAGNOSIS — M25512 Pain in left shoulder: Secondary | ICD-10-CM | POA: Diagnosis not present

## 2021-02-21 ENCOUNTER — Telehealth: Payer: Self-pay | Admitting: Family Medicine

## 2021-02-21 MED ORDER — TOLTERODINE TARTRATE ER 4 MG PO CP24: ORAL_CAPSULE | ORAL | 3 refills | Status: DC

## 2021-02-21 NOTE — Telephone Encounter (Signed)
°  Encourage patient to contact the pharmacy for refills or they can request refills through [place]:  Please schedule appointment if longer than 1 year  NEXT APPOINTMENT DATE:07/31/21  MEDICATION:tolterodine (DETROL LA) 4 MG 24 hr capsule  Is the patient out of medication?   PHARMACY:[id number] - [place], [place] - [address]  Let patient know to contact pharmacy at the end of the day to make sure medication is ready.  Please notify patient to allow 48-72 hours to process  CLINICAL FILLS OUT ALL BELOW:   LAST REFILL:  QTY:  REFILL DATE:    OTHER COMMENTS:    Okay for refill?  Please advise

## 2021-02-21 NOTE — Telephone Encounter (Signed)
Refills sent as requested

## 2021-02-27 DIAGNOSIS — H401132 Primary open-angle glaucoma, bilateral, moderate stage: Secondary | ICD-10-CM | POA: Diagnosis not present

## 2021-03-06 ENCOUNTER — Other Ambulatory Visit: Payer: Self-pay | Admitting: *Deleted

## 2021-03-06 ENCOUNTER — Other Ambulatory Visit: Payer: Medicare HMO

## 2021-03-06 DIAGNOSIS — D509 Iron deficiency anemia, unspecified: Secondary | ICD-10-CM

## 2021-03-08 ENCOUNTER — Ambulatory Visit: Payer: Medicare HMO | Admitting: Oncology

## 2021-03-08 ENCOUNTER — Inpatient Hospital Stay: Payer: Medicare HMO

## 2021-03-12 ENCOUNTER — Inpatient Hospital Stay: Payer: Medicare HMO | Attending: Oncology

## 2021-03-12 DIAGNOSIS — E876 Hypokalemia: Secondary | ICD-10-CM | POA: Insufficient documentation

## 2021-03-12 DIAGNOSIS — D0512 Intraductal carcinoma in situ of left breast: Secondary | ICD-10-CM | POA: Insufficient documentation

## 2021-03-12 DIAGNOSIS — Z79810 Long term (current) use of selective estrogen receptor modulators (SERMs): Secondary | ICD-10-CM | POA: Insufficient documentation

## 2021-03-12 DIAGNOSIS — Z79899 Other long term (current) drug therapy: Secondary | ICD-10-CM | POA: Insufficient documentation

## 2021-03-12 DIAGNOSIS — E538 Deficiency of other specified B group vitamins: Secondary | ICD-10-CM | POA: Insufficient documentation

## 2021-03-12 DIAGNOSIS — M8580 Other specified disorders of bone density and structure, unspecified site: Secondary | ICD-10-CM | POA: Insufficient documentation

## 2021-03-12 DIAGNOSIS — D509 Iron deficiency anemia, unspecified: Secondary | ICD-10-CM | POA: Insufficient documentation

## 2021-03-13 ENCOUNTER — Telehealth: Payer: Self-pay

## 2021-03-13 NOTE — Telephone Encounter (Signed)
Pt no showed to labs on 2/20. please add labs tomorrow prior to visit or r/s ;   labs (next available)  MD/ B12 inj 1-2 days after labs.

## 2021-03-14 ENCOUNTER — Encounter: Payer: Self-pay | Admitting: Oncology

## 2021-03-14 ENCOUNTER — Other Ambulatory Visit: Payer: Self-pay

## 2021-03-14 ENCOUNTER — Inpatient Hospital Stay (HOSPITAL_BASED_OUTPATIENT_CLINIC_OR_DEPARTMENT_OTHER): Payer: Medicare HMO | Admitting: Oncology

## 2021-03-14 ENCOUNTER — Inpatient Hospital Stay: Payer: Medicare HMO

## 2021-03-14 VITALS — BP 152/88 | HR 73 | Temp 98.1°F | Resp 18 | Wt 132.6 lb

## 2021-03-14 DIAGNOSIS — E538 Deficiency of other specified B group vitamins: Secondary | ICD-10-CM

## 2021-03-14 DIAGNOSIS — Z79810 Long term (current) use of selective estrogen receptor modulators (SERMs): Secondary | ICD-10-CM | POA: Diagnosis not present

## 2021-03-14 DIAGNOSIS — D509 Iron deficiency anemia, unspecified: Secondary | ICD-10-CM

## 2021-03-14 DIAGNOSIS — E876 Hypokalemia: Secondary | ICD-10-CM | POA: Diagnosis not present

## 2021-03-14 DIAGNOSIS — M8580 Other specified disorders of bone density and structure, unspecified site: Secondary | ICD-10-CM | POA: Diagnosis not present

## 2021-03-14 DIAGNOSIS — Z79899 Other long term (current) drug therapy: Secondary | ICD-10-CM | POA: Diagnosis not present

## 2021-03-14 DIAGNOSIS — D0512 Intraductal carcinoma in situ of left breast: Secondary | ICD-10-CM | POA: Diagnosis not present

## 2021-03-14 LAB — COMPREHENSIVE METABOLIC PANEL
ALT: 13 U/L (ref 0–44)
AST: 18 U/L (ref 15–41)
Albumin: 3.4 g/dL — ABNORMAL LOW (ref 3.5–5.0)
Alkaline Phosphatase: 42 U/L (ref 38–126)
Anion gap: 5 (ref 5–15)
BUN: 11 mg/dL (ref 8–23)
CO2: 29 mmol/L (ref 22–32)
Calcium: 8.6 mg/dL — ABNORMAL LOW (ref 8.9–10.3)
Chloride: 101 mmol/L (ref 98–111)
Creatinine, Ser: 0.73 mg/dL (ref 0.44–1.00)
GFR, Estimated: >60 mL/min (ref >60)
Glucose, Bld: 108 mg/dL — ABNORMAL HIGH (ref 70–99)
Potassium: 3.2 mmol/L — ABNORMAL LOW (ref 3.5–5.1)
Sodium: 135 mmol/L (ref 135–145)
Total Bilirubin: 0.4 mg/dL (ref 0.3–1.2)
Total Protein: 6 g/dL — ABNORMAL LOW (ref 6.5–8.1)

## 2021-03-14 LAB — CBC WITH DIFFERENTIAL/PLATELET
Abs Immature Granulocytes: 0.01 10*3/uL (ref 0.00–0.07)
Basophils Absolute: 0.1 10*3/uL (ref 0.0–0.1)
Basophils Relative: 1 %
Eosinophils Absolute: 0.1 10*3/uL (ref 0.0–0.5)
Eosinophils Relative: 2 %
HCT: 32.9 % — ABNORMAL LOW (ref 36.0–46.0)
Hemoglobin: 10.2 g/dL — ABNORMAL LOW (ref 12.0–15.0)
Immature Granulocytes: 0 %
Lymphocytes Relative: 29 %
Lymphs Abs: 1.6 10*3/uL (ref 0.7–4.0)
MCH: 27 pg (ref 26.0–34.0)
MCHC: 31 g/dL (ref 30.0–36.0)
MCV: 87 fL (ref 80.0–100.0)
Monocytes Absolute: 0.6 10*3/uL (ref 0.1–1.0)
Monocytes Relative: 10 %
Neutro Abs: 3.2 10*3/uL (ref 1.7–7.7)
Neutrophils Relative %: 58 %
Platelets: 151 10*3/uL (ref 150–400)
RBC: 3.78 MIL/uL — ABNORMAL LOW (ref 3.87–5.11)
RDW: 15.1 % (ref 11.5–15.5)
WBC: 5.6 10*3/uL (ref 4.0–10.5)
nRBC: 0 % (ref 0.0–0.2)

## 2021-03-14 LAB — VITAMIN B12: Vitamin B-12: 944 pg/mL — ABNORMAL HIGH (ref 180–914)

## 2021-03-14 LAB — IRON AND TIBC
Iron: 55 ug/dL (ref 28–170)
Saturation Ratios: 19 % (ref 10.4–31.8)
TIBC: 293 ug/dL (ref 250–450)
UIBC: 238 ug/dL

## 2021-03-14 LAB — FERRITIN: Ferritin: 17 ng/mL (ref 11–307)

## 2021-03-14 LAB — FOLATE: Folate: 6.8 ng/mL (ref >5.9)

## 2021-03-14 LAB — MAGNESIUM: Magnesium: 1.7 mg/dL (ref 1.7–2.4)

## 2021-03-14 MED ORDER — CYANOCOBALAMIN 1000 MCG/ML IJ SOLN
1000.0000 ug | Freq: Once | INTRAMUSCULAR | Status: AC
  Administered 2021-03-14: 1000 ug via INTRAMUSCULAR
  Filled 2021-03-14: qty 1

## 2021-03-14 NOTE — Progress Notes (Signed)
Hematology/Oncology Progress note [phone] [phone]      Office Visit:  03/14/2021  Referring physician: [name], MD  Chief Complaint: [name] is a 76 y.o. female presents for follow up of left breast DCIS s/p mastectomy and B12 deficiency.  PERTINENT ONCOLOGY HISTORY Ashley Ryan is a 76 y.o.afemale who has above oncology history reviewed by me today presented for follow up visit for management of  left breast DCIS s/p mastectomy and B12 deficiency.  Patient previously followed up by Dr.Corcoran, patient switched care to me on 09/07/20 Extensive medical record review was performed by me  06/13/2017.  left breast DCIS s/p simple mastectomy on  Pathology revealed at least 8 mm of grade III DCIS.  Margins were negative.  One sentinel lymph node was negative.  DCIS was ER was + (> 90%) and PR + (75%).  Pathologic stage was Tis N0.   07/03/2017. tamoxifen   # Bone health.  Osteopenia 08/04/2019 Bone density revealed osteopenia with a T score of -2.1 in the right femoral neck.      # iron deficiency anemia.  history of blood transfusion  EGD on 09/09/2013 revealed Cameron erosions, esophageal stricture s/p Maloney dilatation, and a large sliding hiatal hernia.  EGD on 06/04/2019 revealed a medium-sized hiatal hernia, gastritis, and one duodenal polyp (peptic duodenitis).  Pathology revealed iron pill gastritis and no H pylori, metaplasia, dysplasia or malignancy.  Colonoscopy on 09/09/2013 revealed an 8 mm sessile polyp in the cecum (tubular adenoma).  Colonoscopy on 06/04/2019 revealed two 6 to 8 mm polyps in the transverse colon (tubular adenomas), and one 6 mm polyp in the rectum (tubular adenoma).  Clip (MR conditional) was placed.  There was one 3 mm polyp in the ascending colon (tubular adenoma).  There were non-bleeding internal hemorrhoids and diverticulosis in the sigmoid colon.    INTERVAL HISTORY Ashley Ryan is a 76 y.o. female  who has above history reviewed by me today presents for follow up visit for management of history of DCIS, osteopenia She reports feeling well.  No new breast concerns. Patient takes tamoxifen.  She is also on calcium vitamin D supplementation    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  Mastectomy/ SLN.   Chronic headache    Complication of anesthesia    prior to 1991 used to have PONV.  none recently.   Diverticulosis of colon    Family history of adverse reaction to anesthesia    mother/daighter get sick   GERD with stricture    Hard of hearing    Heart murmur    followed by PCP   Hiatal hernia    Hypertension    Neuropathy    feet   Osteoporosis    Pre-diabetes    Wears dentures    full upper    Past Surgical History:  Procedure Laterality Date   ABDOMINAL HYSTERECTOMY  1978   one ovary remains   BREAST BIOPSY Left 05/26/2017   Affirm Bx- coil clip Ductal carcinoma in situ, high nuclear grade with calcifications and focal comedonecrosis   CARDIAC CATHETERIZATION     "yrs ago" all OK.   CATARACT EXTRACTION W/PHACO Left 02/21/2016   Procedure: CATARACT EXTRACTION PHACO AND INTRAOCULAR LENS PLACEMENT ([facility])  Left eye;  Surgeon: Lockie Mola, MD;  Location: [facility];  Service: Ophthalmology;  Laterality: Left;  Left eye Diabetic   CATARACT EXTRACTION W/PHACO Right 03/13/2016   Procedure: CATARACT EXTRACTION PHACO AND INTRAOCULAR LENS PLACEMENT ([place])  right  diabetic;  Surgeon: Leandrew Koyanagi, MD;  Location: [facility];  Service: Ophthalmology;  Laterality: Right;  diabetic   CHOLECYSTECTOMY  1978   COLONOSCOPY W/ POLYPECTOMY  09/09/2013   8 mm tubular adenoma of the cecum.  Erskine Emery, MD. [facility]   COLONOSCOPY WITH PROPOFOL N/A 06/04/2019   Procedure: COLONOSCOPY WITH PROPOFOL;  Surgeon: Lucilla Lame, MD;  Location: [facility];   Service: Endoscopy;  Laterality: N/A;   ESOPHAGOGASTRODUODENOSCOPY (EGD) WITH PROPOFOL N/A 06/04/2019   Procedure: ESOPHAGOGASTRODUODENOSCOPY (EGD) WITH PROPOFOL;  Surgeon: Lucilla Lame, MD;  Location: [facility];  Service: Endoscopy;  Laterality: N/A;   JOINT REPLACEMENT Left    knee    LEFT HEART CATH AND CORONARY ANGIOGRAPHY N/A 11/06/2017   Procedure: LEFT HEART CATH AND CORONARY ANGIOGRAPHY;  Surgeon: Nelva Bush, MD;  Location: [facility];  Service: Cardiovascular;  Laterality: N/A;   MASTECTOMY Left 06/13/2017   mastectomy neg margins   OOPHORECTOMY     one still left   RENAL ANGIOGRAPHY  11/06/2017   Procedure: RENAL ANGIOGRAPHY;  Surgeon: Nelva Bush, MD;  Location: [facility];  Service: Cardiovascular;;   SENTINEL NODE BIOPSY Left 06/13/2017   Procedure: SENTINEL NODE BIOPSY;  Surgeon: Robert Bellow, MD;  Location: [facility];  Service: General;  Laterality: Left;   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  Surgeon: Robert Bellow, MD;  Location: [facility];  Service: General;  Laterality: Left;    Family History  Problem Relation Age of Onset   Breast cancer Other    Stomach cancer Other    Diabetes Other    Heart disease Other    Breast cancer Sister 3   Esophageal cancer Brother    Hypertension Mother    Heart attack Mother 59   Hypertension Father    Coronary artery disease Father 10       CABG   Stroke Father     Social History:  reports that she has never smoked. She has never used smokeless tobacco. She reports that she does not drink alcohol and does not use drugs. Her husband's name is Scientist, research (medical).  She is a retired [employer] manager.  She lives in [place]. The patient is alone today.   Allergies:  Allergies  Allergen Reactions   Oxycodone Nausea Only   Sulfa Antibiotics Rash    Childhood reaction   Sulfonamide Derivatives Rash    Childhood reaction.    Current  Medications: Current Outpatient Medications  Medication Sig Dispense Refill   Alcohol Swabs (B-D SINGLE USE SWABS REGULAR) PADS USE  TO TEST BLOOD SUGAR ONE TIME DAILY 100 each 3   atorvastatin (LIPITOR) 40 MG tablet TAKE 1 TABLET AT BEDTIME 90 tablet 3   Blood Glucose Calibration (ACCU-CHEK AVIVA) SOLN Use to check blood sugar once daily 1 each 0   Blood Glucose Calibration (TRUE METRIX LEVEL 1) Low SOLN USE AS DIRECTED 1 each 3   Blood Glucose Monitoring Suppl (TRUE METRIX AIR GLUCOSE METER) w/Device KIT Use to check blood sugar daily 1 kit 0   brimonidine (ALPHAGAN) 0.2 % ophthalmic solution brimonidine 0.2 % eye drops  INSTILL 1 DROP INTO BOTH EYES TWICE A DAY     Calcium Carbonate-Vitamin D 600-400 MG-UNIT tablet Take 1 tablet  by mouth 2 (two) times daily.     diclofenac Sodium (VOLTAREN) 1 % GEL APPLY 2 GRAMS  TOPICALLY TO THE AFFECTED AREA 4 TIMES DAILY. 700 g 11   esomeprazole (NEXIUM) 40 MG capsule TAKE 1 CAPSULE 2 (TWO) TIMES DAILY BEFORE A MEAL. 180 capsule 1   FLUoxetine (PROZAC) 20 MG capsule TAKE 1 CAPSULE EVERY DAY 90 capsule 1   fluticasone (FLONASE) 50 MCG/ACT nasal spray USE 2 SPRAYS INTO THE NOSE DAILY AS DIRECTED (SUBSTITUTED FOR FLONASE) 48 g 3   furosemide (LASIX) 20 MG tablet TAKE 1 TABLET BY MOUTH EVERY DAY AS NEEDED 30 tablet 5   gabapentin (NEURONTIN) 100 MG capsule TAKE 1 CAPSULE EVERY MORNING , 1 CAPSULE AT LUNCH, 1 CAPSULE AT DINNER, AND 2-3 CAPSULES AT BEDTIME 540 capsule 0   Glucosamine 500 MG TABS Take 500 mg by mouth daily.     glucose blood (TRUE METRIX BLOOD GLUCOSE TEST) test strip Use to check blood sugar daily 100 each 3   isosorbide mononitrate (IMDUR) 30 MG 24 hr tablet TAKE 1 TABLET EVERY DAY 90 tablet 3   KLOR-CON M20 20 MEQ tablet TAKE 1 TABLET BY MOUTH EVERY DAY 90 tablet 1   latanoprost (XALATAN) 0.005 % ophthalmic solution Place 1 drop into both eyes at bedtime.     lidocaine (LIDODERM) 5 % APPLY 1 PATCH ONTO THE SKIN EVERY DAY. REMOVE AND DISCARD  PATCH WITHIN 12 HOURS OR AS DIRECTED BY PHYSICIAN 90 patch 3   loratadine (CLARITIN) 10 MG tablet Take 10 mg by mouth daily.     losartan-hydrochlorothiazide (HYZAAR) 100-25 MG tablet Take 1 tablet by mouth daily.     Omega-3 Fatty Acids (FISH OIL) 1000 MG CAPS Take 1,000 mg by mouth daily.      polycarbophil (FIBERCON) 625 MG tablet Take 625 mg by mouth daily.     tamoxifen (NOLVADEX) 20 MG tablet TAKE 1 TABLET EVERY DAY 90 tablet 0   tolterodine (DETROL LA) 4 MG 24 hr capsule TAKE 1 CAPSULE BY MOUTH EVERY DAY 90 capsule 3   traMADol (ULTRAM) 50 MG tablet TAKE 1 TABLET EVERY 6 HOURS AS NEEDED FOR PAIN 90 tablet 0   triamcinolone cream (KENALOG) 0.5 % Apply 1 application topically 2 (two) times daily. 30 g 0   TRUEplus Lancets 28G MISC TEST BLOOD SUGAR EVERY DAY 100 each 3   No current facility-administered medications for this visit.    Review of Systems  Constitutional:  Negative for chills, diaphoresis, fever, malaise/fatigue and weight loss.  HENT: Negative.  Negative for congestion, ear discharge, ear pain, hearing loss, nosebleeds, sinus pain, sore throat and tinnitus.   Eyes: Negative.  Negative for blurred vision.  Respiratory:  Positive for shortness of breath (occasional). Negative for cough, hemoptysis and sputum production.   Cardiovascular: Negative.  Negative for chest pain, palpitations and leg swelling.  Gastrointestinal:  Negative for abdominal pain, blood in stool, constipation, diarrhea, heartburn, melena, nausea and vomiting.  Genitourinary: Negative.  Negative for dysuria, frequency, hematuria and urgency.  Musculoskeletal:  Positive for back pain and joint pain (knees). Negative for myalgias and neck pain.  Skin: Negative.  Negative for itching and rash.  Neurological: Negative.  Negative for dizziness, tingling, sensory change, weakness and headaches.  Endo/Heme/Allergies: Negative.  Does not bruise/bleed easily.  Psychiatric/Behavioral: Negative.  Negative for  depression and memory loss. The patient is not nervous/anxious and does not have insomnia.   All other systems reviewed and are negative. Performance status (ECOG): 1  Vital Signs Blood pressure (!) 152/88, pulse 73, temperature 98.1 F (36.7 C), resp. rate 18, weight 132 lb 9.6 oz (60.1 kg).  Physical Exam Constitutional:      General: She is not in acute distress.    Appearance: She is not diaphoretic.     Comments: Thin, ambulates independantly  HENT:     Head: Normocephalic and atraumatic.     Nose: Nose normal.     Mouth/Throat:     Pharynx: No oropharyngeal exudate.  Eyes:     General: No scleral icterus.    Pupils: Pupils are equal, round, and reactive to light.  Cardiovascular:     Rate and Rhythm: Normal rate and regular rhythm.     Heart sounds: No murmur heard. Pulmonary:     Effort: Pulmonary effort is normal. No respiratory distress.     Breath sounds: No rales.  Chest:     Chest wall: No tenderness.  Abdominal:     General: There is no distension.     Palpations: Abdomen is soft.     Tenderness: There is no abdominal tenderness.  Musculoskeletal:        General: Normal range of motion.     Cervical back: Normal range of motion and neck supple.  Skin:    General: Skin is warm and dry.     Findings: No erythema.  Neurological:     Mental Status: She is alert and oriented to person, place, and time.     Cranial Nerves: No cranial nerve deficit.     Motor: No abnormal muscle tone.     Coordination: Coordination normal.  Psychiatric:        Mood and Affect: Affect normal.    Laboratory studies.  CBC    Component Value Date/Time   WBC 5.6 03/14/2021 1252   RBC 3.78 (L) 03/14/2021 1252   HGB 10.2 (L) 03/14/2021 1252   HGB 8.6 (L) 11/27/2013 0509   HCT 32.9 (L) 03/14/2021 1252   HCT 27.1 (L) 11/27/2013 0509   PLT 151 03/14/2021 1252   PLT 112 (L) 11/27/2013 0509   MCV 87.0 03/14/2021 1252   MCV 80 11/27/2013 0509   MCH 27.0 03/14/2021 1252   MCHC  31.0 03/14/2021 1252   RDW 15.1 03/14/2021 1252   RDW 20.3 (H) 11/27/2013 0509   LYMPHSABS 1.6 03/14/2021 1252   LYMPHSABS 0.9 (L) 11/27/2013 0509   MONOABS 0.6 03/14/2021 1252   MONOABS 0.5 11/27/2013 0509   EOSABS 0.1 03/14/2021 1252   EOSABS 0.1 11/27/2013 0509   BASOSABS 0.1 03/14/2021 1252   BASOSABS 0.0 11/27/2013 0509   CMP Latest Ref Rng & Units 03/14/2021 01/30/2021 09/14/2020  Glucose 70 - 99 mg/dL 108(H) 93 85  BUN 8 - 23 mg/dL $Remove'11 15 20  'LifMBXu$ Creatinine 0.44 - 1.00 mg/dL 0.73 0.71 0.85  Sodium 135 - 145 mmol/L 135 137 135  Potassium 3.5 - 5.1 mmol/L 3.2(L) 3.3(L) 4.5  Chloride 98 - 111 mmol/L 101 100 101  CO2 22 - 32 mmol/L $RemoveB'29 28 25  'POUDITyq$ Calcium 8.9 - 10.3 mg/dL 8.6(L) 9.2 9.0  Total Protein 6.5 - 8.1 g/dL 6.0(L) 6.5 -  Total Bilirubin 0.3 - 1.2 mg/dL 0.4 0.4 -  Alkaline Phos 38 - 126 U/L 42 48 -  AST 15 - 41 U/L 18 16 -  ALT 0 - 44 U/L 13 13 -      Assessment and plan.   1. Ductal carcinoma in situ (DCIS) of left breast   2.  Iron deficiency anemia, unspecified iron deficiency anemia type   3. B12 deficiency      # Left breast high grade DCIS, s/p mastectomy- 05/2017 Clinically she is doing well.  Obtain annual right screening mammogram in July 2023. Continue tamoxifen 20 mg daily.  Plan total 5 years, and in June 2025.  She is also on Prozac, which is a  strong CYP2D6 inhibitors may interfere metabolism of Tamoxifen, recommend to switch to  Celexa or Lexapro.  Will communicate with her PCP- sent inbasket message.  # Osteopenia Continue calcium and vitamin D  DEXA July 2023 we will obtain.  # B12 deficiency She has been on B12 IM injection monthly B12 level is pending currently. Proceed with empiric vitamin B12 injection today.  Recommend patient to try oral vitamin B12 1020mcg supplementation. Hold off B12 injections.  Repeat B12 the next visit.  #Hypokalemia 3.2, this is likely due to being on diuretics.  Recommend patient continue follow-up with primary care  provider to see if she will need any long-term potassium supplementation.  # History of  Iron deficiency anemia, Hemoglobin dropped to 10.2, Iron saturation 19, folate 6.8, ferritin 17.  RTC in 6 months for MD assessment, labs (CBC with diff, CMP, ferritin, iron studies, B12 ), +/- B12.  I discussed the assessment and treatment plan with the patient.  The patient was provided an opportunity to ask questions and all were answered.  The patient agreed with the plan and demonstrated an understanding of the instructions.  The patient was advised to call back if the symptoms worsen or if the condition fails to improve as anticipated.   Earlie Server, MD, PhD Hematology Oncology  03/14/2021

## 2021-03-14 NOTE — Progress Notes (Signed)
Pt here for follow up. No new concerns voiced.   

## 2021-03-15 ENCOUNTER — Telehealth: Payer: Self-pay

## 2021-03-15 NOTE — Progress Notes (Signed)
Ashley Ryan notified as instructed by telephone.  She states she was started on Prozac for headaches.  She is willing to try stopping the medication.  Please advise with instructions.

## 2021-03-15 NOTE — Telephone Encounter (Signed)
Per Dr. Blanchard Kelch mssg to pt.:  vitamin B12 level is adequate. Please take oral B12 1023mcg daily. I recommend to hold off monthly B12 injections. Will repeat B12 at next visit. Also I have noticed that you are on Prozac which may interfere with the tamoxifen effect. I have sent a message to Dr.Bedsole to see if you can be switched to other medication. Please discuss with Dr.Bedsole about this when you see her.   Spoke to patient and she verbalized understading and states that Dr. Diona Browner has contacted her to addres Prozac issue.

## 2021-03-16 MED ORDER — PAROXETINE HCL 10 MG PO TABS: ORAL_TABLET | ORAL | 0 refills | Status: DC

## 2021-03-16 NOTE — Addendum Note (Signed)
Addended by: Carter Kitten on: 03/16/2021 10:11 AM   Modules accepted: Orders

## 2021-03-16 NOTE — Progress Notes (Signed)
Ms. Moralez notified as instructed by telephone.  Patient states understanding and wrote down instructions.  Rx sent to [facility] in [place] as instructed by Dr. Diona Browner.

## 2021-03-22 ENCOUNTER — Telehealth: Payer: Self-pay

## 2021-03-22 MED ORDER — FLUOXETINE HCL 10 MG PO CAPS: ORAL_CAPSULE | ORAL | 0 refills | Status: DC

## 2021-03-22 NOTE — Telephone Encounter (Signed)
Reviewed chart. ? Appears paxil 10 mg was sent in instead of prozac 10 mg. ? Could be cause of headache or we may need to slow the wean down. ?Called patient. ? Discussed with patient in detail. ?I have sent in a new prescription for Prozac 10 mg with the same wean.   ?The patient will call back if headache is still not resolved and we will have her slowly wean down. ? ?Alternatively the headaches could be her previously treated chronic headaches returning on the lower dose of headache preventative medication. ? ?

## 2021-03-22 NOTE — Telephone Encounter (Signed)
Patient started decreasing her Prozac as directed on Sunday. She lowered to 10 mg. She has started with headache one Tuesday afternoon. She is having pain across forehead. Pain is 5/10 describes a dill headache. She has taken tylenol and tramadol but has not had any improvement in symptoms.  ?

## 2021-03-22 NOTE — Addendum Note (Signed)
Addended byEliezer Lofts E on: 03/22/2021 05:30 PM ? ? Modules accepted: Orders ? ?

## 2021-04-09 ENCOUNTER — Telehealth: Payer: Self-pay | Admitting: Family Medicine

## 2021-04-09 NOTE — Telephone Encounter (Signed)
Pt called stating that she has a week left of Prozac. Pt states that her Cancer Dr doesn't want her on Prozac but her headaches are back. Please advise. ?

## 2021-04-09 NOTE — Telephone Encounter (Signed)
Spoke to patient by telephone and was advised that her headaches have been back for a while. Patient stated that she almost has a headache now every day and the pain level can get to a level 8. Patient stated that she now has a new cancer doctor and she told her that the Prozac can interact with her new cancer drug. ?Pharmacy [facility] ?

## 2021-04-10 ENCOUNTER — Other Ambulatory Visit: Payer: Self-pay | Admitting: Family Medicine

## 2021-04-10 MED ORDER — CITALOPRAM HYDROBROMIDE 20 MG PO TABS: 20.0000 mg | ORAL_TABLET | Freq: Every day | ORAL | 3 refills | Status: DC

## 2021-04-10 NOTE — Telephone Encounter (Signed)
Patient notified as instructed by telephone and verbalized understanding. Patient stated that she does not have a preference and whichever medication Dr. Diona Browner thinks will work best. Patient stated that she had a bad day yesterday which a headache.  ?Pharmacy [facility] ?

## 2021-04-10 NOTE — Telephone Encounter (Signed)
Sent in a new prescription for citalopram.  She needs to stop the Prozac and start the new medicine directly. ?

## 2021-04-10 NOTE — Progress Notes (Signed)
Call to let patient know I have sent in a prescription for her to begin citalopram 1 tablet p.o. daily at bedtime. ?She should stop the Prozac and start the citalopram directly. ?Have her make a follow-up appointment for headaches in 4 weeks for reevaluation. ?

## 2021-04-10 NOTE — Telephone Encounter (Signed)
Given headaches have returned as weaning off prozac... we can try a different medication to treat chronic headache/migraine prevention in a similar drug  family. ? Oncology has stated that Celexa or Lexapro are good options in setting of tamoxifen. ? Does she have a preference? ? Pharmacy? ?

## 2021-04-11 NOTE — Telephone Encounter (Signed)
Patient returned call. She has picked up meds and started today. She has d/c Prozac. No further question at this time.  ?

## 2021-04-11 NOTE — Telephone Encounter (Signed)
Left message on voicemail for patient to call the office back. 

## 2021-04-14 DIAGNOSIS — M25361 Other instability, right knee: Secondary | ICD-10-CM | POA: Diagnosis not present

## 2021-04-14 DIAGNOSIS — M1711 Unilateral primary osteoarthritis, right knee: Secondary | ICD-10-CM | POA: Diagnosis not present

## 2021-04-14 DIAGNOSIS — M25512 Pain in left shoulder: Secondary | ICD-10-CM | POA: Diagnosis not present

## 2021-04-19 ENCOUNTER — Other Ambulatory Visit: Payer: Self-pay | Admitting: Family Medicine

## 2021-04-30 ENCOUNTER — Other Ambulatory Visit: Payer: Self-pay | Admitting: *Deleted

## 2021-04-30 DIAGNOSIS — E1140 Type 2 diabetes mellitus with diabetic neuropathy, unspecified: Secondary | ICD-10-CM

## 2021-04-30 MED ORDER — TRUE METRIX AIR GLUCOSE METER W/DEVICE KIT: PACK | 0 refills | Status: AC

## 2021-04-30 MED ORDER — TRUE METRIX BLOOD GLUCOSE TEST VI STRP: ORAL_STRIP | 3 refills | Status: AC

## 2021-05-03 ENCOUNTER — Other Ambulatory Visit: Payer: Self-pay | Admitting: Family Medicine

## 2021-05-07 ENCOUNTER — Ambulatory Visit (INDEPENDENT_AMBULATORY_CARE_PROVIDER_SITE_OTHER)
Admission: RE | Admit: 2021-05-07 | Discharge: 2021-05-07 | Disposition: A | Discharge status: Home / Self Care | Payer: Medicare PPO | Source: Ambulatory Visit | Attending: Nurse Practitioner | Admitting: Nurse Practitioner

## 2021-05-07 ENCOUNTER — Encounter: Payer: Self-pay | Admitting: Nurse Practitioner

## 2021-05-07 ENCOUNTER — Ambulatory Visit (INDEPENDENT_AMBULATORY_CARE_PROVIDER_SITE_OTHER): Payer: Medicare PPO | Admitting: Nurse Practitioner

## 2021-05-07 ENCOUNTER — Encounter: Payer: Self-pay | Admitting: Urgent Care

## 2021-05-07 VITALS — BP 122/70 | HR 91 | Temp 97.1°F | Resp 10 | Ht 59.65 in | Wt 130.5 lb

## 2021-05-07 DIAGNOSIS — M545 Low back pain, unspecified: Secondary | ICD-10-CM

## 2021-05-07 DIAGNOSIS — M4126 Other idiopathic scoliosis, lumbar region: Secondary | ICD-10-CM | POA: Diagnosis not present

## 2021-05-07 DIAGNOSIS — W19XXXA Unspecified fall, initial encounter: Secondary | ICD-10-CM | POA: Diagnosis not present

## 2021-05-07 DIAGNOSIS — M8588 Other specified disorders of bone density and structure, other site: Secondary | ICD-10-CM | POA: Diagnosis not present

## 2021-05-07 NOTE — Patient Instructions (Signed)
Nice to see you today ?I will be in touch with the labs and xray results ?Follow up if no improvement ?

## 2021-05-07 NOTE — Assessment & Plan Note (Signed)
Patient sustained a fall at home.  Patient states she was "woozy headed" states she went to sit down and did not make the chair fell backwards striking her back.  Patient did not hit head neck or lose consciousness.  Patient is not anticoagulated.  Patient has not had any symptoms since incident.  Does not sound orthostatic in nature.  EKG at patient's baseline in office.  Pending lumbar pictures.  Follow-up if needed ?

## 2021-05-07 NOTE — Assessment & Plan Note (Signed)
Pending lumbar x-ray in office.  Using Tylenol and tramadol as prescribed.  Also suggest patient using her Lidoderm patch to help with pain ?

## 2021-05-07 NOTE — Progress Notes (Signed)
? ?Acute Office Visit ? ?Subjective:  ? ? Patient ID: Ashley Ryan, female    DOB: 1945-05-08, 76 y.o.   MRN: [id number] ? ?Chief Complaint  ?Patient presents with  ? Fall  ?  On 05/05/21-felt wozzy like, went to let the dog out that morning and when she stepped back she fell to the right side of the body. Did not pass out, did not hit her head. Very sore now. She has been having allergy flare up for the last week or so also. Has not had anymore of "woozy"sensation since Saturday.  ? ? ? ?Patient is in today for fall ? ?States that she was not feeling well on Saturday. She states she felt swinmmy headed. She went to let her dog out and went to sit down. States she went to sit down and fell backwards. States she has not had any other symptoms. States that she has not had the feeling sicne. States that it did not happen with position ? ?Went to [place] two weeks ago and when she came back she is dealing allergies. ? ?Past Medical History:  ?Diagnosis Date  ? Anemia   ? Arthritis   ? Breast cancer ([place]) 06/13/2017  ? 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  Mastectomy/ SLN.  ? Chronic headache   ? Complication of anesthesia   ? prior to 1991 used to have PONV.  none recently.  ? Diverticulosis of colon   ? Family history of adverse reaction to anesthesia   ? mother/daighter get sick  ? GERD with stricture   ? Hard of hearing   ? Heart murmur   ? followed by PCP  ? Hiatal hernia   ? Hypertension   ? Neuropathy   ? feet  ? Osteoporosis   ? Pre-diabetes   ? Wears dentures   ? full upper  ? ? ?Past Surgical History:  ?Procedure Laterality Date  ? ABDOMINAL HYSTERECTOMY  1978  ? one ovary remains  ? BREAST BIOPSY Left 05/26/2017  ? Affirm Bx- coil clip Ductal carcinoma in situ, high nuclear grade with calcifications and focal comedonecrosis  ? CARDIAC CATHETERIZATION    ? "yrs ago" all OK.  ? CATARACT EXTRACTION W/PHACO Left 02/21/2016  ? Procedure: CATARACT EXTRACTION PHACO AND INTRAOCULAR LENS PLACEMENT ([place])  Left eye;   Surgeon: Leandrew Koyanagi, MD;  Location: [place];  Service: Ophthalmology;  Laterality: Left;  Left eye ?Diabetic  ? CATARACT EXTRACTION W/PHACO Right 03/13/2016  ? Procedure: CATARACT EXTRACTION PHACO AND INTRAOCULAR LENS PLACEMENT ([place])  right  diabetic;  Surgeon: Leandrew Koyanagi, MD;  Location: [place];  Service: Ophthalmology;  Laterality: Right;  diabetic  ? CHOLECYSTECTOMY  1978  ? COLONOSCOPY W/ POLYPECTOMY  09/09/2013  ? 8 mm tubular adenoma of the cecum.  Erskine Emery, MD. [facility]  ? COLONOSCOPY WITH PROPOFOL N/A 06/04/2019  ? Procedure: COLONOSCOPY WITH PROPOFOL;  Surgeon: Lucilla Lame, MD;  Location: [facility] ENDOSCOPY;  Service: Endoscopy;  Laterality: N/A;  ? ESOPHAGOGASTRODUODENOSCOPY (EGD) WITH PROPOFOL N/A 06/04/2019  ? Procedure: ESOPHAGOGASTRODUODENOSCOPY (EGD) WITH PROPOFOL;  Surgeon: Lucilla Lame, MD;  Location: [facility] ENDOSCOPY;  Service: Endoscopy;  Laterality: N/A;  ? JOINT REPLACEMENT Left   ? knee   ? LEFT HEART CATH AND CORONARY ANGIOGRAPHY N/A 11/06/2017  ? Procedure: LEFT HEART CATH AND CORONARY ANGIOGRAPHY;  Surgeon: Nelva Bush, MD;  Location: [place] CV LAB;  Service: Cardiovascular;  Laterality: N/A;  ? MASTECTOMY Left 06/13/2017  ? mastectomy neg margins  ?  OOPHORECTOMY    ? one still left  ? RENAL ANGIOGRAPHY  11/06/2017  ? Procedure: RENAL ANGIOGRAPHY;  Surgeon: Nelva Bush, MD;  Location: [place] [facility];  Service: Cardiovascular;;  ? SENTINEL NODE BIOPSY Left 06/13/2017  ? Procedure: SENTINEL NODE BIOPSY;  Surgeon: Robert Bellow, MD;  Location: [facility];  Service: General;  Laterality: Left;  ? SIMPLE MASTECTOMY WITH AXILLARY SENTINEL NODE BIOPSY Left 06/13/2017  ? 8 mm high grade DCIS, negative SLN. ER/PR+.  Surgeon: Robert Bellow, MD;  Location: [facility];  Service: General;  Laterality: Left;  ? ? ?Family History  ?Problem Relation Age of Onset  ? Breast cancer Other   ? Stomach cancer Other   ? Diabetes Other   ?  Heart disease Other   ? Breast cancer Sister 85  ? Esophageal cancer Brother   ? Hypertension Mother   ? Heart attack Mother 60  ? Hypertension Father   ? Coronary artery disease Father 58  ?     CABG  ? Stroke Father   ? ? ?Social History  ? ?Socioeconomic History  ? Marital status: Married  ?  Spouse name: Not on file  ? Number of children: 2  ? Years of education: Not on file  ? Highest education level: Not on file  ?Occupational History  ? Occupation: retired cna  ?  Employer: RETIRED  ?Tobacco Use  ? Smoking status: Never  ? Smokeless tobacco: Never  ?Vaping Use  ? Vaping Use: Never used  ?Substance and Sexual Activity  ? Alcohol use: No  ? Drug use: No  ? Sexual activity: Not Currently  ?Other Topics Concern  ? Not on file  ?Social History Narrative  ? Drinks sweet tea, diet sodas 4 a day  ? No living will, full code (reviewed 2014)  ? ?Social Determinants of Health  ? ?Financial Resource Strain: Not on file  ?Food Insecurity: Not on file  ?Transportation Needs: Not on file  ?Physical Activity: Not on file  ?Stress: Not on file  ?Social Connections: Not on file  ?Intimate Partner Violence: Not on file  ? ? ?Outpatient Medications Prior to Visit  ?Medication Sig Dispense Refill  ? Alcohol Swabs (B-D SINGLE USE SWABS REGULAR) PADS USE  TO TEST BLOOD SUGAR ONE TIME DAILY 100 each 3  ? atorvastatin (LIPITOR) 40 MG tablet TAKE 1 TABLET AT BEDTIME 90 tablet 3  ? Blood Glucose Calibration (ACCU-CHEK AVIVA) SOLN Use to check blood sugar once daily 1 each 0  ? Blood Glucose Calibration (TRUE METRIX LEVEL 1) Low SOLN USE AS DIRECTED 1 each 3  ? Blood Glucose Monitoring Suppl (TRUE METRIX AIR GLUCOSE METER) w/Device KIT Use to check blood sugar daily 1 kit 0  ? brimonidine (ALPHAGAN) 0.2 % ophthalmic solution brimonidine 0.2 % eye drops ? INSTILL 1 DROP INTO BOTH EYES TWICE A DAY    ? Calcium Carbonate-Vitamin D 600-400 MG-UNIT tablet Take 1 tablet by mouth 2 (two) times daily.    ? citalopram (CELEXA) 20 MG tablet TAKE  1 TABLET BY MOUTH EVERY DAY 90 tablet 0  ? diclofenac Sodium (VOLTAREN) 1 % GEL APPLY 2 GRAMS  TOPICALLY TO THE AFFECTED AREA 4 TIMES DAILY. 700 g 11  ? esomeprazole (NEXIUM) 40 MG capsule TAKE 1 CAPSULE 2 (TWO) TIMES DAILY BEFORE A MEAL. 180 capsule 1  ? fluticasone (FLONASE) 50 MCG/ACT nasal spray USE 2 SPRAYS INTO THE NOSE DAILY AS DIRECTED (SUBSTITUTED FOR FLONASE) 48 g 3  ?  furosemide (LASIX) 20 MG tablet TAKE 1 TABLET BY MOUTH EVERY DAY AS NEEDED 30 tablet 5  ? gabapentin (NEURONTIN) 100 MG capsule TAKE 1 CAPSULE EVERY MORNING , 1 CAPSULE AT LUNCH, 1 CAPSULE AT DINNER, AND 2-3 CAPSULES AT BEDTIME 540 capsule 0  ? Glucosamine 500 MG TABS Take 500 mg by mouth daily.    ? glucose blood (TRUE METRIX BLOOD GLUCOSE TEST) test strip Use to check blood sugar daily 100 each 3  ? isosorbide mononitrate (IMDUR) 30 MG 24 hr tablet TAKE 1 TABLET EVERY DAY 90 tablet 3  ? KLOR-CON M20 20 MEQ tablet TAKE 1 TABLET BY MOUTH EVERY DAY 90 tablet 1  ? latanoprost (XALATAN) 0.005 % ophthalmic solution Place 1 drop into both eyes at bedtime.    ? lidocaine (LIDODERM) 5 % APPLY 1 PATCH ONTO THE SKIN EVERY DAY. REMOVE AND DISCARD PATCH WITHIN 12 HOURS OR AS DIRECTED BY PHYSICIAN 90 patch 3  ? loratadine (CLARITIN) 10 MG tablet Take 10 mg by mouth daily.    ? losartan-hydrochlorothiazide (HYZAAR) 100-25 MG tablet Take 1 tablet by mouth daily.    ? Omega-3 Fatty Acids (FISH OIL) 1000 MG CAPS Take 1,000 mg by mouth daily.     ? polycarbophil (FIBERCON) 625 MG tablet Take 625 mg by mouth daily.    ? tamoxifen (NOLVADEX) 20 MG tablet TAKE 1 TABLET EVERY DAY 90 tablet 0  ? tolterodine (DETROL LA) 4 MG 24 hr capsule TAKE 1 CAPSULE BY MOUTH EVERY DAY 90 capsule 3  ? traMADol (ULTRAM) 50 MG tablet TAKE 1 TABLET EVERY 6 HOURS AS NEEDED FOR PAIN 90 tablet 0  ? triamcinolone cream (KENALOG) 0.5 % Apply 1 application topically 2 (two) times daily. 30 g 0  ? TRUEplus Lancets 28G MISC TEST BLOOD SUGAR EVERY DAY 100 each 3  ? ?No  facility-administered medications prior to visit.  ? ? ?Allergies  ?Allergen Reactions  ? Oxycodone Nausea Only  ? Sulfa Antibiotics Rash  ?  Childhood reaction  ? Sulfonamide Derivatives Rash  ?  Childhood reaction.  ? ?

## 2021-05-08 LAB — COMPREHENSIVE METABOLIC PANEL
ALT: 18 U/L (ref 0–35)
AST: 15 U/L (ref 0–37)
Albumin: 3.8 g/dL (ref 3.5–5.2)
Alkaline Phosphatase: 49 U/L (ref 39–117)
BUN: 16 mg/dL (ref 6–23)
CO2: 30 mEq/L (ref 19–32)
Calcium: 9 mg/dL (ref 8.4–10.5)
Chloride: 99 mEq/L (ref 96–112)
Creatinine, Ser: 0.76 mg/dL (ref 0.40–1.20)
GFR: 75.3 mL/min (ref >60.00)
Glucose, Bld: 84 mg/dL (ref 70–99)
Potassium: 3.5 mEq/L (ref 3.5–5.1)
Sodium: 136 mEq/L (ref 135–145)
Total Bilirubin: 0.5 mg/dL (ref 0.2–1.2)
Total Protein: 6.5 g/dL (ref 6.0–8.3)

## 2021-05-08 LAB — CBC
HCT: 34.4 % — ABNORMAL LOW (ref 36.0–46.0)
Hemoglobin: 11.3 g/dL — ABNORMAL LOW (ref 12.0–15.0)
MCHC: 33 g/dL (ref 30.0–36.0)
MCV: 82.6 fl (ref 78.0–100.0)
Platelets: 148 10*3/uL — ABNORMAL LOW (ref 150.0–400.0)
RBC: 4.16 Mil/uL (ref 3.87–5.11)
RDW: 16.6 % — ABNORMAL HIGH (ref 11.5–15.5)
WBC: 9.4 10*3/uL (ref 4.0–10.5)

## 2021-05-08 LAB — TSH: TSH: 4.57 u[IU]/mL (ref 0.35–5.50)

## 2021-05-09 ENCOUNTER — Other Ambulatory Visit: Payer: Self-pay | Admitting: Nurse Practitioner

## 2021-05-09 ENCOUNTER — Encounter: Payer: Self-pay | Admitting: Nurse Practitioner

## 2021-05-09 ENCOUNTER — Telehealth: Payer: Self-pay | Admitting: Nurse Practitioner

## 2021-05-09 DIAGNOSIS — M546 Pain in thoracic spine: Secondary | ICD-10-CM | POA: Diagnosis not present

## 2021-05-09 DIAGNOSIS — M545 Low back pain, unspecified: Secondary | ICD-10-CM | POA: Diagnosis not present

## 2021-05-09 DIAGNOSIS — S22070A Wedge compression fracture of T9-T10 vertebra, initial encounter for closed fracture: Secondary | ICD-10-CM

## 2021-05-09 DIAGNOSIS — M47896 Other spondylosis, lumbar region: Secondary | ICD-10-CM | POA: Diagnosis not present

## 2021-05-09 NOTE — Telephone Encounter (Signed)
Patient advised. Patient states she will go ahead and start using Lidocaine patches she has at home. Patient is at [facility] office now she called and was told to come in today now. Advised patient to let us know the plan after she is seen today at Ortho office and that they may help her with pain medication management ?

## 2021-05-09 NOTE — Telephone Encounter (Signed)
Add on the lidoderm patches that is on her medication list and I will check with Dr. Diona Browner about her pain medication and if we can change it ?

## 2021-05-09 NOTE — Telephone Encounter (Signed)
Sounds good

## 2021-05-09 NOTE — Telephone Encounter (Signed)
-----   Message from Blaine sent at 05/09/2021  4:02 PM EDT ----- ?Patient advised. Has been taking Tramadol-1 every 5 hours, and has been taking Tylenol 1 every 6 hours. Pain is not really relieved with these medications. ?

## 2021-05-15 NOTE — Telephone Encounter (Signed)
Noted  Referral updated 

## 2021-05-16 DIAGNOSIS — M546 Pain in thoracic spine: Secondary | ICD-10-CM | POA: Diagnosis not present

## 2021-05-16 DIAGNOSIS — M545 Low back pain, unspecified: Secondary | ICD-10-CM | POA: Diagnosis not present

## 2021-05-18 DIAGNOSIS — M545 Low back pain, unspecified: Secondary | ICD-10-CM | POA: Diagnosis not present

## 2021-05-22 DIAGNOSIS — S22070A Wedge compression fracture of T9-T10 vertebra, initial encounter for closed fracture: Secondary | ICD-10-CM | POA: Diagnosis not present

## 2021-05-22 DIAGNOSIS — M8008XA Age-related osteoporosis with current pathological fracture, vertebra(e), initial encounter for fracture: Secondary | ICD-10-CM | POA: Diagnosis not present

## 2021-05-22 DIAGNOSIS — Z853 Personal history of malignant neoplasm of breast: Secondary | ICD-10-CM | POA: Diagnosis not present

## 2021-06-22 DIAGNOSIS — M545 Low back pain, unspecified: Secondary | ICD-10-CM | POA: Diagnosis not present

## 2021-06-25 ENCOUNTER — Other Ambulatory Visit: Payer: Self-pay | Admitting: Family Medicine

## 2021-06-25 DIAGNOSIS — E1140 Type 2 diabetes mellitus with diabetic neuropathy, unspecified: Secondary | ICD-10-CM

## 2021-06-25 NOTE — Telephone Encounter (Signed)
Last office visit 05/07/21 with Romilda Garret for fall.  Last refilled 06/12/20 for 700 g with 11 refills.  Next Appt: 07/31/2021 for 6 month follow up.

## 2021-06-27 ENCOUNTER — Other Ambulatory Visit: Payer: Self-pay | Admitting: Family Medicine

## 2021-06-27 ENCOUNTER — Other Ambulatory Visit: Payer: Self-pay | Admitting: Oncology

## 2021-06-27 DIAGNOSIS — E1140 Type 2 diabetes mellitus with diabetic neuropathy, unspecified: Secondary | ICD-10-CM

## 2021-07-31 ENCOUNTER — Ambulatory Visit: Payer: Medicare HMO | Admitting: Family Medicine

## 2021-08-03 ENCOUNTER — Other Ambulatory Visit: Payer: Self-pay | Admitting: Family Medicine

## 2021-08-04 ENCOUNTER — Other Ambulatory Visit: Payer: Self-pay | Admitting: Family Medicine

## 2021-08-05 NOTE — Telephone Encounter (Signed)
Last office visit 05/07/21 with Romilda Garret for Fall and Lumbar Pain.  Last refilled 02/02/21 for #540 with no refills.  Next Appt: 08/17/21 for 6 month follow up.

## 2021-08-06 ENCOUNTER — Ambulatory Visit: Payer: Medicare HMO

## 2021-08-17 ENCOUNTER — Encounter: Payer: Self-pay | Admitting: Family Medicine

## 2021-08-17 ENCOUNTER — Ambulatory Visit (INDEPENDENT_AMBULATORY_CARE_PROVIDER_SITE_OTHER): Payer: Medicare PPO | Admitting: Family Medicine

## 2021-08-17 VITALS — BP 130/70 | HR 80 | Temp 98.4°F | Ht 59.65 in | Wt 131.0 lb

## 2021-08-17 DIAGNOSIS — E1142 Type 2 diabetes mellitus with diabetic polyneuropathy: Secondary | ICD-10-CM

## 2021-08-17 DIAGNOSIS — S22070A Wedge compression fracture of T9-T10 vertebra, initial encounter for closed fracture: Secondary | ICD-10-CM | POA: Insufficient documentation

## 2021-08-17 DIAGNOSIS — E1140 Type 2 diabetes mellitus with diabetic neuropathy, unspecified: Secondary | ICD-10-CM | POA: Diagnosis not present

## 2021-08-17 LAB — POCT GLYCOSYLATED HEMOGLOBIN (HGB A1C): Hemoglobin A1C: 5.7 % — AB (ref 4.0–5.6)

## 2021-08-17 NOTE — Assessment & Plan Note (Signed)
Chronic, diet controlled. 

## 2021-08-17 NOTE — Progress Notes (Signed)
Patient ID: Ashley Ryan, female    DOB: 11-06-1945, 76 y.o.   MRN: [id number]   This visit was conducted in person.  BP 130/70   Pulse 80   Temp 98.4 F (36.9 C) (Oral)   Ht 4' 11.65" (1.515 m)   Wt 131 lb (59.4 kg)   SpO2 95%   BMI 25.89 kg/m    CC:  Chief Complaint  Patient presents with   Diabetes   Fall    Back in May    Subjective:   HPI: Ashley Ryan is a 76 y.o. female presenting on 08/17/2021 for Diabetes and Fall (Back in May)  Diabetes:   Diet controlled. Lab Results  Component Value Date   HGBA1C 5.7 (A) 08/17/2021  Using medications without difficulties: Hypoglycemic episodes: Hyperglycemic episodes: Feet problems: no ulcers Blood Sugars averaging: rear eye exam within last year: yes   She reports she was not feeling well in 04/2021.. missed stepped, fell.. fractured 2 vertebrae.. Dr. Noralyn Pick. Treated with vertebroplasy... helped pain.  She does have daily pain in  low back pain right , knee, left hand.   Using tramadol for pain prn. Has upcoming bone density in 08/2021.  Relevant past medical, surgical, family and social history reviewed and updated as indicated. Interim medical history since our last visit reviewed. Allergies and medications reviewed and updated. Outpatient Medications Prior to Visit  Medication Sig Dispense Refill   Alcohol Swabs (DROPSAFE ALCOHOL PREP) 70 % PADS USE  TO TEST BLOOD SUGAR ONE TIME DAILY 100 each 3   atorvastatin (LIPITOR) 40 MG tablet TAKE 1 TABLET AT BEDTIME 90 tablet 3   Blood Glucose Calibration (ACCU-CHEK AVIVA) SOLN Use to check blood sugar once daily 1 each 0   Blood Glucose Calibration (TRUE METRIX LEVEL 1) Low SOLN USE AS DIRECTED 1 each 3   Blood Glucose Monitoring Suppl (TRUE METRIX AIR GLUCOSE METER) w/Device KIT Use to check blood sugar daily 1 kit 0   brimonidine (ALPHAGAN) 0.2 % ophthalmic solution brimonidine 0.2 % eye drops  INSTILL 1 DROP INTO BOTH EYES TWICE A DAY     Calcium  Carbonate-Vitamin D 600-400 MG-UNIT tablet Take 1 tablet by mouth 2 (two) times daily.     citalopram (CELEXA) 20 MG tablet TAKE 1 TABLET BY MOUTH EVERY DAY 90 tablet 0   diclofenac Sodium (VOLTAREN) 1 % GEL APPLY 2 GRAMS TOPICALLY TO THE AFFECTED AREA 4 TIMES DAILY. 700 g 11   esomeprazole (NEXIUM) 40 MG capsule TAKE 1 CAPSULE 2 (TWO) TIMES DAILY BEFORE A MEAL. 180 capsule 1   fluticasone (FLONASE) 50 MCG/ACT nasal spray USE 2 SPRAYS INTO THE NOSE DAILY AS DIRECTED (SUBSTITUTED FOR FLONASE) 48 g 3   furosemide (LASIX) 20 MG tablet TAKE 1 TABLET BY MOUTH EVERY DAY AS NEEDED 30 tablet 5   gabapentin (NEURONTIN) 100 MG capsule TAKE 1 CAP IN THE MORNING, 1 CAP AT LUNCH, 1 CAPS AT DINNER AND 2 TO 3 CAPS AT BEDTIME 540 capsule 5   Glucosamine 500 MG TABS Take 500 mg by mouth daily.     glucose blood (TRUE METRIX BLOOD GLUCOSE TEST) test strip Use to check blood sugar daily 100 each 3   isosorbide mononitrate (IMDUR) 30 MG 24 hr tablet TAKE 1 TABLET EVERY DAY 90 tablet 3   KLOR-CON M20 20 MEQ tablet TAKE 1 TABLET BY MOUTH EVERY DAY 90 tablet 1   latanoprost (XALATAN) 0.005 % ophthalmic solution Place 1 drop into both eyes at  bedtime.     lidocaine (LIDODERM) 5 % APPLY 1 PATCH ONTO THE SKIN EVERY DAY. REMOVE AND DISCARD PATCH WITHIN 12 HOURS OR AS DIRECTED BY PHYSICIAN 90 patch 3   loratadine (CLARITIN) 10 MG tablet Take 10 mg by mouth daily.     losartan-hydrochlorothiazide (HYZAAR) 100-25 MG tablet Take 1 tablet by mouth daily.     Omega-3 Fatty Acids (FISH OIL) 1000 MG CAPS Take 1,000 mg by mouth daily.      polycarbophil (FIBERCON) 625 MG tablet Take 625 mg by mouth daily.     tamoxifen (NOLVADEX) 20 MG tablet TAKE 1 TABLET EVERY DAY 90 tablet 0   tolterodine (DETROL LA) 4 MG 24 hr capsule TAKE 1 CAPSULE BY MOUTH EVERY DAY 90 capsule 3   traMADol (ULTRAM) 50 MG tablet TAKE 1 TABLET EVERY 6 HOURS AS NEEDED FOR PAIN 90 tablet 0   triamcinolone cream (KENALOG) 0.5 % Apply 1 application topically 2  (two) times daily. 30 g 0   TRUEplus Lancets 28G MISC TEST BLOOD SUGAR EVERY DAY 100 each 3   No facility-administered medications prior to visit.     Per HPI unless specifically indicated in ROS section below Review of Systems  Constitutional:  Negative for fatigue and fever.  HENT:  Negative for congestion.   Eyes:  Negative for pain.  Respiratory:  Negative for cough and shortness of breath.   Cardiovascular:  Negative for chest pain, palpitations and leg swelling.  Gastrointestinal:  Negative for abdominal pain.  Genitourinary:  Negative for dysuria and vaginal bleeding.  Musculoskeletal:  Negative for back pain.  Neurological:  Negative for syncope, light-headedness and headaches.  Psychiatric/Behavioral:  Negative for dysphoric mood.    Objective:  BP 130/70   Pulse 80   Temp 98.4 F (36.9 C) (Oral)   Ht 4' 11.65" (1.515 m)   Wt 131 lb (59.4 kg)   SpO2 95%   BMI 25.89 kg/m   Wt Readings from Last 3 Encounters:  08/17/21 131 lb (59.4 kg)  05/07/21 130 lb 8 oz (59.2 kg)  03/14/21 132 lb 9.6 oz (60.1 kg)      Physical Exam Constitutional:      General: She is not in acute distress.    Appearance: Normal appearance. She is well-developed. She is not ill-appearing or toxic-appearing.  HENT:     Head: Normocephalic.     Right Ear: Hearing, tympanic membrane, ear canal and external ear normal. Tympanic membrane is not erythematous, retracted or bulging.     Left Ear: Hearing, tympanic membrane, ear canal and external ear normal. Tympanic membrane is not erythematous, retracted or bulging.     Nose: No mucosal edema or rhinorrhea.     Right Sinus: No maxillary sinus tenderness or frontal sinus tenderness.     Left Sinus: No maxillary sinus tenderness or frontal sinus tenderness.     Mouth/Throat:     Pharynx: Uvula midline.  Eyes:     General: Lids are normal. Lids are everted, no foreign bodies appreciated.     Conjunctiva/sclera: Conjunctivae normal.     Pupils:  Pupils are equal, round, and reactive to light.  Neck:     Thyroid: No thyroid mass or thyromegaly.     Vascular: No carotid bruit.     Trachea: Trachea normal.  Cardiovascular:     Rate and Rhythm: Normal rate and regular rhythm.     Pulses: Normal pulses.     Heart sounds: Normal heart sounds, S1 normal and S2  normal. No murmur heard.    No friction rub. No gallop.  Pulmonary:     Effort: Pulmonary effort is normal. No tachypnea or respiratory distress.     Breath sounds: Normal breath sounds. No decreased breath sounds, wheezing, rhonchi or rales.  Abdominal:     General: Bowel sounds are normal.     Palpations: Abdomen is soft.     Tenderness: There is no abdominal tenderness.  Musculoskeletal:     Cervical back: Normal range of motion and neck supple.  Skin:    General: Skin is warm and dry.     Findings: No rash.  Neurological:     Mental Status: She is alert.  Psychiatric:        Mood and Affect: Mood is not anxious or depressed.        Speech: Speech normal.        Behavior: Behavior normal. Behavior is cooperative.        Thought Content: Thought content normal.        Judgment: Judgment normal.       Results for orders placed or performed in visit on 08/17/21  POCT glycosylated hemoglobin (Hb A1C)  Result Value Ref Range   Hemoglobin A1C 5.7 (A) 4.0 - 5.6 %   HbA1c POC (<> result, manual entry)     HbA1c, POC (prediabetic range)     HbA1c, POC (controlled diabetic range)       COVID 19 screen:  No recent travel or known exposure to COVID19 The patient denies respiratory symptoms of COVID 19 at this time. The importance of social distancing was discussed today.   Assessment and Plan    Problem List Items Addressed This Visit     Controlled type 2 diabetes with neuropathy ([place]) - Primary (Chronic)    Chronic, diet controlled      Relevant Orders   POCT glycosylated hemoglobin (Hb A1C) (Completed)   Diabetic peripheral neuropathy (H[facility]) (Chronic)     Associated with diabetes.  Well-controlled with current gabapentin regimen..      Compression fracture of T10 vertebra ([facility])    Acute, treated with vertebroplasty and followed by Dr. Arbie Cookey.  She has upcoming bone density in August 2020.Marland Kitchen  Past DEXA  has shown osteopenia.        Eliezer Lofts, MD

## 2021-08-17 NOTE — Assessment & Plan Note (Signed)
Acute, treated with vertebroplasty and followed by Dr. Arbie Cookey.  She has upcoming bone density in August 2020.Marland Kitchen  Past DEXA  has shown osteopenia.

## 2021-08-17 NOTE — Patient Instructions (Signed)
Recommend weight bearing exercise, calcium in diet and vit D supplement 400 IU 1-2 times daily.  

## 2021-08-17 NOTE — Assessment & Plan Note (Signed)
Associated with diabetes.  Well-controlled with current gabapentin regimen.. 

## 2021-08-19 ENCOUNTER — Other Ambulatory Visit: Payer: Self-pay | Admitting: Family Medicine

## 2021-08-27 ENCOUNTER — Ambulatory Visit (INDEPENDENT_AMBULATORY_CARE_PROVIDER_SITE_OTHER): Payer: Medicare PPO | Admitting: Family Medicine

## 2021-08-27 ENCOUNTER — Encounter: Payer: Self-pay | Admitting: Family Medicine

## 2021-08-27 VITALS — BP 150/80 | HR 83 | Temp 98.3°F | Ht 59.65 in | Wt 132.2 lb

## 2021-08-27 DIAGNOSIS — M1711 Unilateral primary osteoarthritis, right knee: Secondary | ICD-10-CM | POA: Diagnosis not present

## 2021-08-27 MED ORDER — TRIAMCINOLONE ACETONIDE 40 MG/ML IJ SUSP
40.0000 mg | Freq: Once | INTRAMUSCULAR | Status: AC
  Administered 2021-08-27: 40 mg via INTRA_ARTICULAR

## 2021-08-27 NOTE — Progress Notes (Signed)
E[place]ard[place] Dada T. Mariadejesus Cade, MD, CAQ Sports Medicine [facility] at [facility] [address] [place], [address]  Phone: [phone]  FAX: [phone]  Ashley Ryan - 76 y.o. female  MRN [id number]  Date of Birth: 03-Jul-1945  Date: 08/27/2021  PCP: [name], MD  Referral: [name], MD  Chief Complaint  Patient presents with   Knee Pain    Right   Subjective:   Ashley Ryan is a 76 y.o. very pleasant female patient with Body mass index is 26.13 kg/m. who presents with the following:  Patient has known severe right-sided knee osteoarthritis, she is having a flareup right now, to the point where it is causing her to limp.  She is under a lot of stress right now, and her husband has multiple medical problems including recurrent leukemia, as well as kidney disease and cardiac issues.  Compounding issues are that she had a vertebral fracture in the end of April.  Review of Systems is noted in the HPI, as appropriate  Objective:   BP (!) 150/80   Pulse 83   Temp 98.3 F (36.8 C) (Oral)   Ht 4' 11.65" (1.515 m)   Wt 132 lb 4 oz (60 kg)   SpO2 96%   BMI 26.13 kg/m   GEN: No acute distress; alert,appropriate. PULM: Breathing comfortably in no respiratory distress PSYCH: Normally interactive.   Right knee: Quite tender medial and lateral joint line tenderness.  She does have patellar crepitus and minimal motion of the patella.  Stable to varus and valgus stress.  Lachman is negative as well as anterior and posterior drawer testing.  Any kind of forced flexion causes pain.  Laboratory and Imaging Data:  Assessment and Plan:     ICD-10-CM   1. Primary osteoarthritis of right knee  M17.11 triamcinolone acetonide (KENALOG-40) injection 40 mg     Acute on chronic advanced knee osteoarthritis with exacerbation.  She has had reasonable success in the past on the order of months with intra-articular injection of corticosteroid, so I think it  is reasonable to continue this.  I think that continuing some Tylenol and tramadol as she has been doing is also reasonable.  Aspiration/Injection Procedure Note Ashley Ryan 1945/08/29 Date of procedure: 08/27/2021  Procedure: Large Joint Aspiration / Injection of Knee, R Indications: Pain  Procedure Details Patient verbally consented to procedure. Risks, benefits, and alternatives explained. Sterilely prepped with Chloraprep. Ethyl cholride used for anesthesia. 9 cc Lidocaine 1% mixed with 1 mL of Kenalog 40 mg injected using the anteromedial approach without difficulty. No complications with procedure and tolerated well. Patient had decreased pain post-injection. Medication: 1 mL of Kenalog 40 mg   Medication Management during today's office visit: Meds ordered this encounter  Medications   triamcinolone acetonide (KENALOG-40) injection 40 mg    Dragon [employer] One speech-to-text software was used for transcription in this dictation.  Possible transcriptional errors can occur using Animal nutritionist.   Signed,  Ashley Galea. Kier Smead, MD   Outpatient Encounter Medications as of 08/27/2021  Medication Sig   Alcohol Swabs (DROPSAFE ALCOHOL PREP) 70 % PADS USE  TO TEST BLOOD SUGAR ONE TIME DAILY   atorvastatin (LIPITOR) 40 MG tablet TAKE 1 TABLET AT BEDTIME   Blood Glucose Calibration (ACCU-CHEK AVIVA) SOLN Use to check blood sugar once daily   Blood Glucose Calibration (TRUE METRIX LEVEL 1) Low SOLN USE AS DIRECTED   Blood Glucose Monitoring Suppl (TRUE METRIX AIR GLUCOSE  METER) w/Device KIT Use to check blood sugar daily   brimonidine (ALPHAGAN) 0.2 % ophthalmic solution brimonidine 0.2 % eye drops  INSTILL 1 DROP INTO BOTH EYES TWICE A DAY   Calcium Carbonate-Vitamin D 600-400 MG-UNIT tablet Take 1 tablet by mouth 2 (two) times daily.   citalopram (CELEXA) 20 MG tablet TAKE 1 TABLET BY MOUTH EVERY DAY   diclofenac Sodium (VOLTAREN) 1 % GEL APPLY 2 GRAMS TOPICALLY TO THE AFFECTED AREA  4 TIMES DAILY.   esomeprazole (NEXIUM) 40 MG capsule TAKE 1 CAPSULE 2 (TWO) TIMES DAILY BEFORE A MEAL.   fluticasone (FLONASE) 50 MCG/ACT nasal spray USE 2 SPRAYS INTO THE NOSE DAILY AS DIRECTED (SUBSTITUTED FOR FLONASE)   furosemide (LASIX) 20 MG tablet TAKE 1 TABLET BY MOUTH EVERY DAY AS NEEDED   gabapentin (NEURONTIN) 100 MG capsule TAKE 1 CAP IN THE MORNING, 1 CAP AT LUNCH, 1 CAPS AT DINNER AND 2 TO 3 CAPS AT BEDTIME   Glucosamine 500 MG TABS Take 500 mg by mouth daily.   glucose blood (TRUE METRIX BLOOD GLUCOSE TEST) test strip Use to check blood sugar daily   isosorbide mononitrate (IMDUR) 30 MG 24 hr tablet TAKE 1 TABLET EVERY DAY   KLOR-CON M20 20 MEQ tablet TAKE 1 TABLET BY MOUTH EVERY DAY   latanoprost (XALATAN) 0.005 % ophthalmic solution Place 1 drop into both eyes at bedtime.   lidocaine (LIDODERM) 5 % APPLY 1 PATCH ONTO THE SKIN EVERY DAY. REMOVE AND DISCARD PATCH WITHIN 12 HOURS OR AS DIRECTED BY PHYSICIAN   loratadine (CLARITIN) 10 MG tablet Take 10 mg by mouth daily.   losartan-hydrochlorothiazide (HYZAAR) 100-25 MG tablet Take 1 tablet by mouth daily.   Omega-3 Fatty Acids (FISH OIL) 1000 MG CAPS Take 1,000 mg by mouth daily.    polycarbophil (FIBERCON) 625 MG tablet Take 625 mg by mouth daily.   tamoxifen (NOLVADEX) 20 MG tablet TAKE 1 TABLET EVERY DAY   tolterodine (DETROL LA) 4 MG 24 hr capsule TAKE 1 CAPSULE BY MOUTH EVERY DAY   traMADol (ULTRAM) 50 MG tablet TAKE 1 TABLET EVERY 6 HOURS AS NEEDED FOR PAIN   triamcinolone cream (KENALOG) 0.5 % Apply 1 application topically 2 (two) times daily.   TRUEplus Lancets 28G MISC TEST BLOOD SUGAR EVERY DAY   [EXPIRED] triamcinolone acetonide (KENALOG-40) injection 40 mg    No facility-administered encounter medications on file as of 08/27/2021.

## 2021-08-29 ENCOUNTER — Other Ambulatory Visit: Payer: Self-pay | Admitting: Family Medicine

## 2021-08-29 NOTE — Telephone Encounter (Signed)
Last office visit 08/27/21 with Dr. Lorelei Pont for Knee Pain.  Last refilled Gabapentin 08/06/21 for #540 with 3 refills.  Originally sent to [facility] now it is being requested by [facility].   Tramadol 02/02/21 for #90 with no refills.  CPE scheduled 02/19/22.

## 2021-08-30 ENCOUNTER — Telehealth: Payer: Self-pay

## 2021-08-30 NOTE — Telephone Encounter (Signed)
MEDICATION: tolterodine (DETROL LA) 4 MG 24 hr capsule  PHARMACY: [id number] [place], [place] - [address]  Comments:   **Let patient know to contact pharmacy at the end of the day to make sure medication is ready. **  ** Please notify patient to allow 48-72 hours to process**  **Encourage patient to contact the pharmacy for refills or they can [facility] refills through [facility]*

## 2021-08-31 ENCOUNTER — Ambulatory Visit
Admission: RE | Admit: 2021-08-31 | Discharge: 2021-08-31 | Disposition: A | Discharge status: Home / Self Care | Payer: Medicare PPO | Source: Ambulatory Visit | Attending: Oncology | Admitting: Oncology

## 2021-08-31 DIAGNOSIS — Z1231 Encounter for screening mammogram for malignant neoplasm of breast: Secondary | ICD-10-CM | POA: Diagnosis not present

## 2021-08-31 DIAGNOSIS — M810 Age-related osteoporosis without current pathological fracture: Secondary | ICD-10-CM | POA: Diagnosis not present

## 2021-08-31 DIAGNOSIS — D0512 Intraductal carcinoma in situ of left breast: Secondary | ICD-10-CM

## 2021-08-31 DIAGNOSIS — Z853 Personal history of malignant neoplasm of breast: Secondary | ICD-10-CM | POA: Insufficient documentation

## 2021-08-31 DIAGNOSIS — Z13820 Encounter for screening for osteoporosis: Secondary | ICD-10-CM | POA: Diagnosis not present

## 2021-08-31 DIAGNOSIS — Z780 Asymptomatic menopausal state: Secondary | ICD-10-CM | POA: Diagnosis not present

## 2021-08-31 DIAGNOSIS — M85852 Other specified disorders of bone density and structure, left thigh: Secondary | ICD-10-CM | POA: Diagnosis not present

## 2021-08-31 MED ORDER — TOLTERODINE TARTRATE ER 4 MG PO CP24: ORAL_CAPSULE | ORAL | 3 refills | Status: DC

## 2021-08-31 NOTE — Telephone Encounter (Signed)
[facility] in [facility] to the refill that was sent in on 02/21/21 for a year, per Cristie Hem that was transferred to [facility] order pharmacy. I called and spoke with patient. [facility]. [facility].

## 2021-09-03 ENCOUNTER — Other Ambulatory Visit: Payer: Self-pay | Admitting: Oncology

## 2021-09-03 ENCOUNTER — Encounter: Payer: Self-pay | Admitting: Urgent Care

## 2021-09-03 DIAGNOSIS — R928 Other abnormal and inconclusive findings on diagnostic imaging of breast: Secondary | ICD-10-CM

## 2021-09-03 DIAGNOSIS — N6489 Other specified disorders of breast: Secondary | ICD-10-CM

## 2021-09-04 ENCOUNTER — Telehealth: Payer: Self-pay

## 2021-09-04 ENCOUNTER — Telehealth: Payer: Self-pay | Admitting: Family Medicine

## 2021-09-04 NOTE — Telephone Encounter (Signed)
-----   Message from Earlie Server, MD sent at 09/03/2021 11:06 PM EDT ----- Please arrange patient to have diagnostic mammogram and ultrasound of the right breast.

## 2021-09-04 NOTE — Telephone Encounter (Signed)
Please schedule patient for diagnostic mam of right breast, an [place] of right breast. Please notify patient of appt. Thanks

## 2021-09-04 NOTE — Telephone Encounter (Signed)
Patient called in with [facility] Department on the line stating that she needs Personal Emergency Response System. They stated that the provider has to call and sign the member up for this system. [phone]. Thank you!

## 2021-09-05 NOTE — Telephone Encounter (Signed)
Spoke to Ashley Ryan at [place] [facility] [place] and was advised that patient is eligible for their services. Ashley Ryan stated that the doctor does not have to get involved with this. Ashley Ryan stated that the patient needs to call [facility] and they usually do a three way call to get the patient signed up for the services.  Called and advised patient of what Jenny Reichmann said and she verbalized understanding. Patient stated that she will contact [employer].

## 2021-09-06 ENCOUNTER — Other Ambulatory Visit: Payer: Self-pay | Admitting: Family Medicine

## 2021-09-07 ENCOUNTER — Ambulatory Visit
Admission: RE | Admit: 2021-09-07 | Discharge: 2021-09-07 | Disposition: A | Discharge status: Home / Self Care | Payer: Medicare HMO | Source: Ambulatory Visit | Attending: Oncology | Admitting: Oncology

## 2021-09-07 ENCOUNTER — Ambulatory Visit: Payer: Medicare HMO

## 2021-09-07 DIAGNOSIS — R928 Other abnormal and inconclusive findings on diagnostic imaging of breast: Secondary | ICD-10-CM | POA: Insufficient documentation

## 2021-09-07 DIAGNOSIS — N6489 Other specified disorders of breast: Secondary | ICD-10-CM | POA: Insufficient documentation

## 2021-09-07 DIAGNOSIS — R922 Inconclusive mammogram: Secondary | ICD-10-CM | POA: Diagnosis not present

## 2021-09-10 ENCOUNTER — Inpatient Hospital Stay: Payer: Medicare HMO | Attending: Oncology

## 2021-09-10 ENCOUNTER — Telehealth: Payer: Self-pay

## 2021-09-10 DIAGNOSIS — Z9012 Acquired absence of left breast and nipple: Secondary | ICD-10-CM | POA: Insufficient documentation

## 2021-09-10 DIAGNOSIS — M85851 Other specified disorders of bone density and structure, right thigh: Secondary | ICD-10-CM | POA: Diagnosis not present

## 2021-09-10 DIAGNOSIS — E876 Hypokalemia: Secondary | ICD-10-CM | POA: Diagnosis not present

## 2021-09-10 DIAGNOSIS — D0512 Intraductal carcinoma in situ of left breast: Secondary | ICD-10-CM | POA: Diagnosis not present

## 2021-09-10 DIAGNOSIS — Z79899 Other long term (current) drug therapy: Secondary | ICD-10-CM | POA: Insufficient documentation

## 2021-09-10 DIAGNOSIS — D509 Iron deficiency anemia, unspecified: Secondary | ICD-10-CM | POA: Diagnosis not present

## 2021-09-10 DIAGNOSIS — E538 Deficiency of other specified B group vitamins: Secondary | ICD-10-CM | POA: Insufficient documentation

## 2021-09-10 DIAGNOSIS — Z79810 Long term (current) use of selective estrogen receptor modulators (SERMs): Secondary | ICD-10-CM | POA: Diagnosis not present

## 2021-09-10 LAB — CBC WITH DIFFERENTIAL/PLATELET
Abs Immature Granulocytes: 0.02 10*3/uL (ref 0.00–0.07)
Basophils Absolute: 0.1 10*3/uL (ref 0.0–0.1)
Basophils Relative: 1 %
Eosinophils Absolute: 0.1 10*3/uL (ref 0.0–0.5)
Eosinophils Relative: 1 %
HCT: 35.8 % — ABNORMAL LOW (ref 36.0–46.0)
Hemoglobin: 11 g/dL — ABNORMAL LOW (ref 12.0–15.0)
Immature Granulocytes: 0 %
Lymphocytes Relative: 36 %
Lymphs Abs: 2 10*3/uL (ref 0.7–4.0)
MCH: 26.3 pg (ref 26.0–34.0)
MCHC: 30.7 g/dL (ref 30.0–36.0)
MCV: 85.4 fL (ref 80.0–100.0)
Monocytes Absolute: 0.5 10*3/uL (ref 0.1–1.0)
Monocytes Relative: 9 %
Neutro Abs: 3 10*3/uL (ref 1.7–7.7)
Neutrophils Relative %: 53 %
Platelets: 189 10*3/uL (ref 150–400)
RBC: 4.19 MIL/uL (ref 3.87–5.11)
RDW: 15.4 % (ref 11.5–15.5)
WBC: 5.6 10*3/uL (ref 4.0–10.5)
nRBC: 0 % (ref 0.0–0.2)

## 2021-09-10 LAB — COMPREHENSIVE METABOLIC PANEL
ALT: 11 U/L (ref 0–44)
AST: 18 U/L (ref 15–41)
Albumin: 3.7 g/dL (ref 3.5–5.0)
Alkaline Phosphatase: 53 U/L (ref 38–126)
BUN: 17 mg/dL (ref 8–23)
CO2: 27 mmol/L (ref 22–32)
Calcium: 8.7 mg/dL — ABNORMAL LOW (ref 8.9–10.3)
Chloride: 99 mmol/L (ref 98–111)
Creatinine, Ser: 0.9 mg/dL (ref 0.44–1.00)
GFR, Estimated: >60 mL/min (ref >60)
Glucose, Bld: 96 mg/dL (ref 70–99)
Potassium: 2.7 mmol/L — CL (ref 3.5–5.1)
Sodium: 134 mmol/L — ABNORMAL LOW (ref 135–145)
Total Bilirubin: 0.5 mg/dL (ref 0.3–1.2)
Total Protein: 6.7 g/dL (ref 6.5–8.1)

## 2021-09-10 LAB — VITAMIN B12: Vitamin B-12: 937 pg/mL — ABNORMAL HIGH (ref 180–914)

## 2021-09-10 LAB — FERRITIN: Ferritin: 21 ng/mL (ref 11–307)

## 2021-09-10 LAB — FOLATE: Folate: 6.8 ng/mL (ref >5.9)

## 2021-09-10 NOTE — Telephone Encounter (Signed)
Received [facility] lab notice: K+=2.7. Per Dr. Tasia Catchings, pt to take 40 mEq today and 73mq daily. Will recheck BMP on 8/23. Pt informed of plan and verbalized understanding.   Please schedule lab on 8/23 prior to seeing MD. Pt aware that she will be getting repeat bloodwork.

## 2021-09-11 ENCOUNTER — Encounter: Payer: Self-pay | Admitting: Urgent Care

## 2021-09-12 ENCOUNTER — Inpatient Hospital Stay: Payer: Medicare HMO

## 2021-09-12 ENCOUNTER — Inpatient Hospital Stay (HOSPITAL_BASED_OUTPATIENT_CLINIC_OR_DEPARTMENT_OTHER): Payer: Medicare HMO | Admitting: Oncology

## 2021-09-12 ENCOUNTER — Encounter: Payer: Self-pay | Admitting: Oncology

## 2021-09-12 VITALS — BP 122/66 | HR 75 | Resp 18 | Ht 59.65 in | Wt 128.0 lb

## 2021-09-12 DIAGNOSIS — Z79810 Long term (current) use of selective estrogen receptor modulators (SERMs): Secondary | ICD-10-CM | POA: Diagnosis not present

## 2021-09-12 DIAGNOSIS — E538 Deficiency of other specified B group vitamins: Secondary | ICD-10-CM | POA: Diagnosis not present

## 2021-09-12 DIAGNOSIS — E876 Hypokalemia: Secondary | ICD-10-CM

## 2021-09-12 DIAGNOSIS — D0512 Intraductal carcinoma in situ of left breast: Secondary | ICD-10-CM

## 2021-09-12 DIAGNOSIS — D509 Iron deficiency anemia, unspecified: Secondary | ICD-10-CM | POA: Diagnosis not present

## 2021-09-12 DIAGNOSIS — Z9012 Acquired absence of left breast and nipple: Secondary | ICD-10-CM | POA: Diagnosis not present

## 2021-09-12 DIAGNOSIS — M85851 Other specified disorders of bone density and structure, right thigh: Secondary | ICD-10-CM | POA: Diagnosis not present

## 2021-09-12 DIAGNOSIS — Z79899 Other long term (current) drug therapy: Secondary | ICD-10-CM | POA: Diagnosis not present

## 2021-09-12 LAB — BASIC METABOLIC PANEL
Anion gap: 10 (ref 5–15)
BUN: 15 mg/dL (ref 8–23)
CO2: 24 mmol/L (ref 22–32)
Calcium: 8.7 mg/dL — ABNORMAL LOW (ref 8.9–10.3)
Chloride: 101 mmol/L (ref 98–111)
Creatinine, Ser: 0.74 mg/dL (ref 0.44–1.00)
GFR, Estimated: >60 mL/min (ref >60)
Glucose, Bld: 120 mg/dL — ABNORMAL HIGH (ref 70–99)
Potassium: 3.1 mmol/L — ABNORMAL LOW (ref 3.5–5.1)
Sodium: 135 mmol/L (ref 135–145)

## 2021-09-12 MED ORDER — TAMOXIFEN CITRATE 20 MG PO TABS: 20.0000 mg | ORAL_TABLET | Freq: Every day | ORAL | 1 refills | Status: DC

## 2021-09-12 MED ORDER — CALCIUM CARB-CHOLECALCIFEROL 600-5 MG-MCG PO TABS: 2.0000 | ORAL_TABLET | Freq: Every day | ORAL | 2 refills | Status: AC

## 2021-09-12 NOTE — Progress Notes (Addendum)
Hematology/Oncology Progress note [phone] [phone]      Office Visit:  09/12/2021  Referring physician: [name], MD  Chief Complaint: Ashley Ryan is a 76 y.o. female presents for follow up of left breast DCIS s/p mastectomy and B12 deficiency.  PERTINENT ONCOLOGY HISTORY Ashley Ryan is a 76 y.o.afemale who has above oncology history reviewed by me today presented for follow up visit for management of  left breast DCIS s/p mastectomy and B12 deficiency.  Patient previously followed up by Dr.Corcoran, patient switched care to me on 09/07/20 Extensive medical record review was performed by me  06/13/2017.  left breast DCIS s/p simple mastectomy on  Pathology revealed at least 8 mm of grade III DCIS.  Margins were negative.  One sentinel lymph node was negative.  DCIS was ER was + (> 90%) and PR + (75%).  Pathologic stage was Tis N0.   07/03/2017. tamoxifen   # Bone health.  Osteopenia 08/04/2019 Bone density revealed osteopenia with a T score of -2.1 in the right femoral neck.      # iron deficiency anemia.  history of blood transfusion  EGD on 09/09/2013 revealed Cameron erosions, esophageal stricture s/p Maloney dilatation, and a large sliding hiatal hernia.  EGD on 06/04/2019 revealed a medium-sized hiatal hernia, gastritis, and one duodenal polyp (peptic duodenitis).  Pathology revealed iron pill gastritis and no H pylori, metaplasia, dysplasia or malignancy.  Colonoscopy on 09/09/2013 revealed an 8 mm sessile polyp in the cecum (tubular adenoma).  Colonoscopy on 06/04/2019 revealed two 6 to 8 mm polyps in the transverse colon (tubular adenomas), and one 6 mm polyp in the rectum (tubular adenoma).  Clip (MR conditional) was placed.  There was one 3 mm polyp in the ascending colon (tubular adenoma).  There were non-bleeding internal hemorrhoids and diverticulosis in the sigmoid colon.    INTERVAL HISTORY Ashley Ryan is a 76 y.o. female  who has above history reviewed by me today presents for follow up visit for management of history of DCIS, osteoporosis. Patient reports feeling well.  She has no new concerns.  No breast concerns.    Past Medical History:  Diagnosis Date   Anemia    Arthritis    Breast cancer ([place]) 06/13/2017   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  Mastectomy/ SLN.   Chronic headache    Complication of anesthesia    prior to 1991 used to have PONV.  none recently.   Diverticulosis of colon    Family history of adverse reaction to anesthesia    mother/daighter get sick   GERD with stricture    Hard of hearing    Heart murmur    followed by PCP   Hiatal hernia    Hypertension    Neuropathy    feet   Osteoporosis    Pre-diabetes    Wears dentures    full upper    Past Surgical History:  Procedure Laterality Date   ABDOMINAL HYSTERECTOMY  1978   one ovary remains   BREAST BIOPSY Left 05/26/2017   Affirm Bx- coil clip Ductal carcinoma in situ, high nuclear grade with calcifications and focal comedonecrosis   CARDIAC CATHETERIZATION     "yrs ago" all OK.   CATARACT EXTRACTION W/PHACO Left 02/21/2016   Procedure: CATARACT EXTRACTION PHACO AND INTRAOCULAR LENS PLACEMENT ([place])  Left eye;  Surgeon: Leandrew Koyanagi, MD;  Location: [place];  Service: Ophthalmology;  Laterality: Left;  Left eye Diabetic  CATARACT EXTRACTION W/PHACO Right 03/13/2016   Procedure: CATARACT EXTRACTION PHACO AND INTRAOCULAR LENS PLACEMENT ([place])  right  diabetic;  Surgeon: Leandrew Koyanagi, MD;  Location: [place];  Service: Ophthalmology;  Laterality: Right;  diabetic   CHOLECYSTECTOMY  1978   COLONOSCOPY W/ POLYPECTOMY  09/09/2013   8 mm tubular adenoma of the cecum.  Erskine Emery, MD. [facility]   COLONOSCOPY WITH PROPOFOL N/A 06/04/2019   Procedure: COLONOSCOPY WITH PROPOFOL;  Surgeon: Lucilla Lame, MD;  Location: [facility] ENDOSCOPY;  Service: Endoscopy;  Laterality: N/A;    ESOPHAGOGASTRODUODENOSCOPY (EGD) WITH PROPOFOL N/A 06/04/2019   Procedure: ESOPHAGOGASTRODUODENOSCOPY (EGD) WITH PROPOFOL;  Surgeon: Lucilla Lame, MD;  Location: [facility] ENDOSCOPY;  Service: Endoscopy;  Laterality: N/A;   JOINT REPLACEMENT Left    knee    LEFT HEART CATH AND CORONARY ANGIOGRAPHY N/A 11/06/2017   Procedure: LEFT HEART CATH AND CORONARY ANGIOGRAPHY;  Surgeon: Nelva Bush, MD;  Location: [place] [facility];  Service: Cardiovascular;  Laterality: N/A;   MASTECTOMY Left 06/13/2017   mastectomy neg margins   OOPHORECTOMY     one still left   RENAL ANGIOGRAPHY  11/06/2017   Procedure: RENAL ANGIOGRAPHY;  Surgeon: Nelva Bush, MD;  Location: [place] [facility];  Service: Cardiovascular;;   SENTINEL NODE BIOPSY Left 06/13/2017   Procedure: SENTINEL NODE BIOPSY;  Surgeon: Robert Bellow, MD;  Location: [facility];  Service: General;  Laterality: Left;   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  Surgeon: Robert Bellow, MD;  Location: [facility];  Service: General;  Laterality: Left;    Family History  Problem Relation Age of Onset   Breast cancer Other    Stomach cancer Other    Diabetes Other    Heart disease Other    Breast cancer Sister 8   Esophageal cancer Brother    Hypertension Mother    Heart attack Mother 70   Hypertension Father    Coronary artery disease Father 76       CABG   Stroke Father     Social History:  reports that she has never smoked. She has never used smokeless tobacco. She reports that she does not drink alcohol and does not use drugs. Her husband's name is Scientist, research (medical).  She is a retired [employer] manager.  She lives in [place]. The patient is alone today.   Allergies:  Allergies  Allergen Reactions   Oxycodone Nausea Only   Sulfa Antibiotics Rash    Childhood reaction   Sulfonamide Derivatives Rash    Childhood reaction.    Current Medications: Current Outpatient  Medications  Medication Sig Dispense Refill   Alcohol Swabs (DROPSAFE ALCOHOL PREP) 70 % PADS USE  TO TEST BLOOD SUGAR ONE TIME DAILY 100 each 3   atorvastatin (LIPITOR) 40 MG tablet TAKE 1 TABLET AT BEDTIME 90 tablet 3   Blood Glucose Calibration (ACCU-CHEK AVIVA) SOLN Use to check blood sugar once daily 1 each 0   Blood Glucose Calibration (TRUE METRIX LEVEL 1) Low SOLN USE AS DIRECTED 1 each 3   Blood Glucose Monitoring Suppl (TRUE METRIX AIR GLUCOSE METER) w/Device KIT Use to check blood sugar daily 1 kit 0   brimonidine (ALPHAGAN) 0.2 % ophthalmic solution brimonidine 0.2 % eye drops  INSTILL 1 DROP INTO BOTH EYES TWICE A DAY     Calcium Carb-Cholecalciferol (CALCIUM 600 + D) 600-5 MG-MCG TABS Take 2 tablets by mouth daily. 180 tablet  2   citalopram (CELEXA) 20 MG tablet TAKE 1 TABLET BY MOUTH EVERY DAY 90 tablet 0   diclofenac Sodium (VOLTAREN) 1 % GEL APPLY 2 GRAMS TOPICALLY TO THE AFFECTED AREA 4 TIMES DAILY. 700 g 11   esomeprazole (NEXIUM) 40 MG capsule TAKE 1 CAPSULE 2 (TWO) TIMES DAILY BEFORE A MEAL. 180 capsule 1   fluticasone (FLONASE) 50 MCG/ACT nasal spray USE 2 SPRAYS INTO THE NOSE DAILY AS DIRECTED (SUBSTITUTED FOR FLONASE) 48 g 3   furosemide (LASIX) 20 MG tablet TAKE 1 TABLET BY MOUTH EVERY DAY AS NEEDED 30 tablet 5   gabapentin (NEURONTIN) 100 MG capsule TAKE 1 CAPSULE EVERY MORNING , 1 CAPSULE AT LUNCH, 1 CAPSULE AT DINNER, AND 2-3 CAPSULES AT BEDTIME 540 capsule 5   Glucosamine 500 MG TABS Take 500 mg by mouth daily.     glucose blood (TRUE METRIX BLOOD GLUCOSE TEST) test strip Use to check blood sugar daily 100 each 3   isosorbide mononitrate (IMDUR) 30 MG 24 hr tablet TAKE 1 TABLET EVERY DAY 90 tablet 3   KLOR-CON M20 20 MEQ tablet TAKE 1 TABLET BY MOUTH EVERY DAY 90 tablet 1   latanoprost (XALATAN) 0.005 % ophthalmic solution Place 1 drop into both eyes at bedtime.     lidocaine (LIDODERM) 5 % APPLY 1 PATCH ONTO THE SKIN EVERY DAY. REMOVE AND DISCARD PATCH WITHIN 12  HOURS OR AS DIRECTED BY PHYSICIAN 90 patch 3   loratadine (CLARITIN) 10 MG tablet Take 10 mg by mouth daily.     losartan-hydrochlorothiazide (HYZAAR) 100-25 MG tablet Take 1 tablet by mouth daily.     Omega-3 Fatty Acids (FISH OIL) 1000 MG CAPS Take 1,000 mg by mouth daily.      polycarbophil (FIBERCON) 625 MG tablet Take 625 mg by mouth daily.     tolterodine (DETROL LA) 4 MG 24 hr capsule TAKE 1 CAPSULE BY MOUTH EVERY DAY 90 capsule 3   traMADol (ULTRAM) 50 MG tablet TAKE 1 TABLET EVERY 6 HOURS AS NEEDED FOR PAIN 90 tablet 0   triamcinolone cream (KENALOG) 0.5 % Apply 1 application topically 2 (two) times daily. 30 g 0   TRUEplus Lancets 28G MISC TEST BLOOD SUGAR EVERY DAY 100 each 3   tamoxifen (NOLVADEX) 20 MG tablet Take 1 tablet (20 mg total) by mouth daily. 90 tablet 1   No current facility-administered medications for this visit.    Review of Systems  Constitutional:  Negative for chills, diaphoresis, fever, malaise/fatigue and weight loss.  HENT: Negative.  Negative for congestion, ear discharge, ear pain, hearing loss, nosebleeds, sinus pain, sore throat and tinnitus.   Eyes: Negative.  Negative for blurred vision.  Respiratory:  Positive for shortness of breath (occasional). Negative for cough, hemoptysis and sputum production.   Cardiovascular: Negative.  Negative for chest pain, palpitations and leg swelling.  Gastrointestinal:  Negative for abdominal pain, blood in stool, constipation, diarrhea, heartburn, melena, nausea and vomiting.  Genitourinary: Negative.  Negative for dysuria, frequency, hematuria and urgency.  Musculoskeletal:  Positive for back pain and joint pain (knees). Negative for myalgias and neck pain.  Skin: Negative.  Negative for itching and rash.  Neurological: Negative.  Negative for dizziness, tingling, sensory change, weakness and headaches.  Endo/Heme/Allergies: Negative.  Does not bruise/bleed easily.  Psychiatric/Behavioral: Negative.  Negative for  depression and memory loss. The patient is not nervous/anxious and does not have insomnia.   All other systems reviewed and are negative.  Performance status (ECOG): 1  Vital  Signs Blood pressure 122/66, pulse 75, resp. rate 18, height 4' 11.65" (1.515 m), weight 128 lb (58.1 kg), SpO2 100 %.  Physical Exam Constitutional:      General: She is not in acute distress.    Appearance: She is not diaphoretic.     Comments: Thin, ambulates independantly  HENT:     Head: Normocephalic and atraumatic.     Nose: Nose normal.     Mouth/Throat:     Pharynx: No oropharyngeal exudate.  Eyes:     General: No scleral icterus.    Pupils: Pupils are equal, round, and reactive to light.  Cardiovascular:     Rate and Rhythm: Normal rate.  Pulmonary:     Effort: Pulmonary effort is normal. No respiratory distress.  Chest:     Chest wall: No tenderness.  Abdominal:     General: There is no distension.     Palpations: Abdomen is soft.     Tenderness: There is no abdominal tenderness.  Musculoskeletal:        General: Normal range of motion.     Cervical back: Normal range of motion and neck supple.  Skin:    General: Skin is warm.     Findings: No erythema.  Neurological:     Mental Status: She is alert and oriented to person, place, and time.     Cranial Nerves: No cranial nerve deficit.     Motor: No abnormal muscle tone.  Psychiatric:        Mood and Affect: Mood and affect normal.     Laboratory studies.     Latest Ref Rng & Units 09/10/2021    2:12 PM 05/07/2021    4:05 PM 03/14/2021   12:52 PM  CBC  WBC 4.0 - 10.5 K/uL 5.6  9.4  5.6   Hemoglobin 12.0 - 15.0 g/dL 11.0  11.3  10.2   Hematocrit 36.0 - 46.0 % 35.8  34.4  32.9   Platelets 150 - 400 K/uL 189  148.0  151        Latest Ref Rng & Units 09/12/2021   12:52 PM 09/10/2021    2:12 PM 05/07/2021    4:05 PM  CMP  Glucose 70 - 99 mg/dL 120  96  84   BUN 8 - 23 mg/dL _0 Creatinine 0.44 - 1.00 mg/dL 0.74  0.90   0.76   Sodium 135 - 145 mmol/L 135  134  136   Potassium 3.5 - 5.1 mmol/L 3.1  2.7  3.5   Chloride 98 - 111 mmol/L 101  99  99   CO2 22 - 32 mmol/L _1 Calcium 8.9 - 10.3 mg/dL 8.7  8.7  9.0   Total Protein 6.5 - 8.1 g/dL  6.7  6.5   Total Bilirubin 0.3 - 1.2 mg/dL  0.5  0.5   Alkaline Phos 38 - 126 U/L  53  49   AST 15 - 41 U/L  18  15   ALT 0 - 44 U/L  11  18       Assessment and plan.   1. Ductal carcinoma in situ (DCIS) of left breast   2. Iron deficiency anemia, unspecified iron deficiency anemia type   3. Hypokalemia      # Left breast high grade DCIS, s/p mastectomy- 05/2017 09/07/2021 unilateral right screening mammogram showed possible asymmetry.  Asymmetry did not persist on additional views Continue tamoxifen 20  mg daily.  Plan total 5 years, end in June 2024.  Patient is currently on Celexa.[Switched from Prozac]  #Osteoporosis. Commend calcium and vitamin D supplementation.  Prescription sent.  Bone density was reviewed and discussed with patient. I discussed with patient at length about the risk factors [race, post-menopausal status]; prevention/treatment strategies-  including but not limited to exercise calcium and vitamin D daily.   Discussed the rationale and potential side effects of bisphosphonate including but not limited to- hypocalcemia, osteonecrosis, bone pain,nausea, fever, fatigue,etc. patient has no teeth, denies any jaw pain. Recommend Zometa 4 mg every 6 months.   # B12 deficiency B12 level is elevated.  Hold off B12 injections.  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   Recommend patient to take potassium chloride 20 mEq daily.  Patient reports to have enough supply at home. Follow-up with primary care provider.   # History of  Iron deficiency anemia, Hemoglobin has improved.  Ferritin is 21.  RTC in 6 months for MD assessment, labs (CBC with diff, CMP, ferritin, iron studies, B12 ), +/- B12.  I discussed the  assessment and treatment plan with the patient.  The patient was provided an opportunity to ask questions and all were answered.  The patient agreed with the plan and demonstrated an understanding of the instructions.  The patient was advised to call back if the symptoms worsen or if the condition fails to improve as anticipated.   Earlie Server, MD, PhD Hematology Oncology  09/12/2021

## 2021-09-17 NOTE — Progress Notes (Deleted)
   Follow-up Outpatient Visit Date: 09/19/2021  Primary Care Provider: [name], MD [place] [address]  Chief Complaint: ***  HPI:  [name] is a 76 y.o. female with history of  nonobstructive coronary artery disease, HFpEF, mild pulmonary hypertension (RVSP 35-40% by echo in 10/2017), systemic hypertension, hiatal hernia, prediabetes, left breast DCIS s/p mastectomy, anemia, and arthritis, who presents for follow-up of stable angina and HFpEF.  I last saw her in 03/2020, at which time she was feeling relatively well other than stress at home.  Chronic exertional dyspnea was stable.  We did not make any medication changes or pursue additional testing.  --------------------------------------------------------------------------------------------------  ***  Recent CV Pertinent Labs: Lab Results  Component Value Date   CHOL 123 01/30/2021   HDL 56.30 01/30/2021   LDLCALC 46 01/30/2021   LDLDIRECT 102.5 07/21/2009   TRIG 101.0 01/30/2021   CHOLHDL 2 01/30/2021   INR 1.16 11/06/2017   INR 0.9 11/11/2013   K 3.1 (L) 09/12/2021   K 3.9 11/25/2013   MG 1.7 03/14/2021   BUN 15 09/12/2021   BUN 13 10/13/2019   BUN 16 11/25/2013   CREATININE 0.74 09/12/2021   CREATININE 0.67 11/25/2013    Past medical and surgical history were reviewed and updated in EPIC.  No outpatient medications have been marked as taking for the 09/19/21 encounter (Appointment) with Wilfred Siverson, Harrell Gave, MD.    Allergies: Oxycodone, Sulfa antibiotics, and Sulfonamide derivatives  Social History   Tobacco Use   Smoking status: Never   Smokeless tobacco: Never  Vaping Use   Vaping Use: Never used  Substance Use Topics   Alcohol use: No   Drug use: No    Family History  Problem Relation Age of Onset   Breast cancer Other    Stomach cancer Other    Diabetes Other    Heart disease Other    Breast cancer Sister 65   Esophageal cancer Brother    Hypertension Mother     Heart attack Mother 34   Hypertension Father    Coronary artery disease Father 48       CABG   Stroke Father     Review of Systems: A 12-system review of systems was performed and was negative except as noted in the HPI.  --------------------------------------------------------------------------------------------------  Physical Exam: There were no vitals taken for this visit.  General:  NAD. Neck: No JVD or HJR. Lungs: Clear to auscultation bilaterally without wheezes or crackles. Heart: Regular rate and rhythm without murmurs, rubs, or gallops. Abdomen: Soft, nontender, nondistended. Extremities: No lower extremity edema.  EKG:  ***  Lab Results  Component Value Date   WBC 5.6 09/10/2021   HGB 11.0 (L) 09/10/2021   HCT 35.8 (L) 09/10/2021   MCV 85.4 09/10/2021   PLT 189 09/10/2021    Lab Results  Component Value Date   NA 135 09/12/2021   K 3.1 (L) 09/12/2021   CL 101 09/12/2021   CO2 24 09/12/2021   BUN 15 09/12/2021   CREATININE 0.74 09/12/2021   GLUCOSE 120 (H) 09/12/2021   ALT 11 09/10/2021    Lab Results  Component Value Date   CHOL 123 01/30/2021   HDL 56.30 01/30/2021   LDLCALC 46 01/30/2021   LDLDIRECT 102.5 07/21/2009   TRIG 101.0 01/30/2021   CHOLHDL 2 01/30/2021    --------------------------------------------------------------------------------------------------  ASSESSMENT AND PLAN: Nelva Bush, MD 09/17/2021 4:48 PM

## 2021-09-18 NOTE — Progress Notes (Unsigned)
    Ashley Ryan T. [name], MD, [place] at [facility] [place], [address]  Phone: [phone]  FAX: [phone]  Ashley Ryan - 76 y.o. female  MRN [id number]  Date of Birth: 13-Jul-1945  Date: 09/19/2021  PCP: Ashley Sanders, MD  Referral: Ashley Sanders, MD  No chief complaint on file.  Subjective:   Ashley Ryan is a 76 y.o. very pleasant female patient with There is no height or weight on file to calculate BMI. who presents with the following:  Pleasant, well-known patient of many years who presents with some left-sided foot pain.    Review of Systems is noted in the HPI, as appropriate  Objective:   There were no vitals taken for this visit.  GEN: No acute distress; alert,appropriate. PULM: Breathing comfortably in no respiratory distress PSYCH: Normally interactive.   Laboratory and Imaging Data:  Assessment and Plan:   ***

## 2021-09-19 ENCOUNTER — Ambulatory Visit (INDEPENDENT_AMBULATORY_CARE_PROVIDER_SITE_OTHER)
Admission: RE | Admit: 2021-09-19 | Discharge: 2021-09-19 | Disposition: A | Discharge status: Home / Self Care | Payer: Medicare HMO | Source: Ambulatory Visit | Attending: Family Medicine | Admitting: Family Medicine

## 2021-09-19 ENCOUNTER — Ambulatory Visit: Payer: Medicare PPO | Admitting: Internal Medicine

## 2021-09-19 ENCOUNTER — Encounter: Payer: Self-pay | Admitting: Family Medicine

## 2021-09-19 ENCOUNTER — Ambulatory Visit (INDEPENDENT_AMBULATORY_CARE_PROVIDER_SITE_OTHER): Payer: Medicare HMO | Admitting: Family Medicine

## 2021-09-19 VITALS — BP 100/60 | HR 93 | Temp 98.3°F | Ht 59.65 in | Wt 131.0 lb

## 2021-09-19 DIAGNOSIS — M79672 Pain in left foot: Secondary | ICD-10-CM

## 2021-09-19 DIAGNOSIS — M7989 Other specified soft tissue disorders: Secondary | ICD-10-CM | POA: Diagnosis not present

## 2021-09-19 DIAGNOSIS — S92535A Nondisplaced fracture of distal phalanx of left lesser toe(s), initial encounter for closed fracture: Secondary | ICD-10-CM

## 2021-09-20 ENCOUNTER — Inpatient Hospital Stay: Payer: Medicare HMO

## 2021-09-20 VITALS — BP 149/72 | HR 74 | Temp 98.0°F | Resp 18

## 2021-09-20 DIAGNOSIS — E538 Deficiency of other specified B group vitamins: Secondary | ICD-10-CM

## 2021-09-20 DIAGNOSIS — Z79899 Other long term (current) drug therapy: Secondary | ICD-10-CM | POA: Diagnosis not present

## 2021-09-20 DIAGNOSIS — D0512 Intraductal carcinoma in situ of left breast: Secondary | ICD-10-CM | POA: Diagnosis not present

## 2021-09-20 DIAGNOSIS — Z9012 Acquired absence of left breast and nipple: Secondary | ICD-10-CM | POA: Diagnosis not present

## 2021-09-20 DIAGNOSIS — Z79810 Long term (current) use of selective estrogen receptor modulators (SERMs): Secondary | ICD-10-CM | POA: Diagnosis not present

## 2021-09-20 DIAGNOSIS — E876 Hypokalemia: Secondary | ICD-10-CM | POA: Diagnosis not present

## 2021-09-20 DIAGNOSIS — M85851 Other specified disorders of bone density and structure, right thigh: Secondary | ICD-10-CM | POA: Diagnosis not present

## 2021-09-20 DIAGNOSIS — D509 Iron deficiency anemia, unspecified: Secondary | ICD-10-CM | POA: Diagnosis not present

## 2021-09-20 MED ORDER — ZOLEDRONIC ACID 4 MG/5ML IV CONC
3.3000 mg | Freq: Once | INTRAVENOUS | Status: AC
  Administered 2021-09-20: 3.3 mg via INTRAVENOUS
  Filled 2021-09-20: qty 4.13

## 2021-09-20 MED ORDER — SODIUM CHLORIDE 0.9 % IV SOLN
Freq: Once | INTRAVENOUS | Status: AC
  Filled 2021-09-20: qty 250

## 2021-10-11 NOTE — Progress Notes (Signed)
Follow-up Outpatient Visit Date: 10/12/2021  Primary Care Provider: [name], MD [place] [address]  Chief Complaint: Dizziness  HPI:  Ms. Fink is a 76 y.o. female with history of nonobstructive coronary artery disease, HFpEF, mild pulmonary hypertension (RVSP 35-40% by echo in 10/2017), systemic hypertension, hiatal hernia, prediabetes, anemia, and arthritis, who presents for follow-up of stable angina and HFpEF.  I last saw her in 03/2020, at which time she was feeling fairly well with stable exertional dyspnea and no chest pain.  She was under a bit more stress at home.  We did not make any medication changes or pursue additional testing.  Today, Ms. Warga reports that she has been feeling increasingly dizzy over the last 4 months.  She has fallen 4 times since April, with a couple of the falls leading to fractures (vertebral compression fracture and toe fracture).  A couple of falls were mechanical, caused by "clumsiness."  However, at least 2 or 3 were a result of lightheadedness shortly after standing up and beginning to walk.  She does not use a cane or walker on a regular basis.  Symptoms began around the time that she was transition from Prozac to Celexa due to an interaction between Prozac and her cancer medications.  She notes that she had been on Prozac for many years to prophylax against migraine headaches.  She has only had rare headaches recently.  She denies chest pain and shortness of breath.  She has minimal dependent edema and has only needed to use as needed furosemide on rare occasions.  She notes very rare, brief palpitations that last 1 or 2 seconds and are not associated with her lightheadedness.  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   Mastectomy/ SLN.   Chronic headache    Complication of anesthesia    prior to 1991 used to have PONV.  none recently.   Diverticulosis of colon    Family history of adverse reaction to anesthesia    mother/daighter get sick   GERD with stricture    Hard of hearing    Heart murmur    followed by PCP   Hiatal hernia    Hypertension    Neuropathy    feet   Osteoporosis    Pre-diabetes    Wears dentures    full upper   Past Surgical History:  Procedure Laterality Date   ABDOMINAL HYSTERECTOMY  1978   one ovary remains   BREAST BIOPSY Left 05/26/2017   Affirm Bx- coil clip Ductal carcinoma in situ, high nuclear grade with calcifications and focal comedonecrosis   CARDIAC CATHETERIZATION     "yrs ago" all OK.   CATARACT EXTRACTION W/PHACO Left 02/21/2016   Procedure: CATARACT EXTRACTION PHACO AND INTRAOCULAR LENS PLACEMENT ([place])  Left eye;  Surgeon: Leandrew Koyanagi, MD;  Location: [facility];  Service: Ophthalmology;  Laterality: Left;  Left eye Diabetic   CATARACT EXTRACTION W/PHACO Right 03/13/2016   Procedure: CATARACT EXTRACTION PHACO AND INTRAOCULAR LENS PLACEMENT ([place])  right  diabetic;  Surgeon: Leandrew Koyanagi, MD;  Location: [place];  Service: Ophthalmology;  Laterality: Right;  diabetic   CHOLECYSTECTOMY  1978   COLONOSCOPY W/ POLYPECTOMY  09/09/2013   8 mm tubular adenoma of the cecum.  Erskine Emery, MD. [place] [facility]   COLONOSCOPY WITH PROPOFOL N/A  06/04/2019   Procedure: COLONOSCOPY WITH PROPOFOL;  Surgeon: Lucilla Lame, MD;  Location: [facility] ENDOSCOPY;  Service: Endoscopy;  Laterality: N/A;   ESOPHAGOGASTRODUODENOSCOPY (EGD) WITH PROPOFOL N/A 06/04/2019   Procedure: ESOPHAGOGASTRODUODENOSCOPY (EGD) WITH PROPOFOL;  Surgeon: Lucilla Lame, MD;  Location: [facility] END[facility]COPY;  Service: Endoscopy;  Laterality: N/A;   JOINT REPLACEMENT Left    knee    LEFT HEART CATH AND CORONARY ANGIOGRAPHY N/A 11/06/2017   Procedure: LEFT HEART CATH AND CORONARY  ANGIOGRAPHY;  Surgeon: Nelva Bush, MD;  Location: [place] CV LAB;  Service: Cardiovascular;  Laterality: N/A;   MASTECTOMY Left 06/13/2017   mastectomy neg margins   OOPHORECTOMY     one still left   RENAL ANGIOGRAPHY  11/06/2017   Procedure: RENAL ANGIOGRAPHY;  Surgeon: Nelva Bush, MD;  Location: [place] CV LAB;  Service: Cardiovascular;;   SENTINEL NODE BIOPSY Left 06/13/2017   Procedure: SENTINEL NODE BIOPSY;  Surgeon: Robert Bellow, MD;  Location: [facility] ORS;  Service: General;  Laterality: Left;   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  Surgeon: Robert Bellow, MD;  Location: [facility] ORS;  Service: General;  Laterality: Left;    Current Meds  Medication Sig   Alcohol Swabs (DROPSAFE ALCOHOL PREP) 70 % PADS USE  TO TEST BLOOD SUGAR ONE TIME DAILY   atorvastatin (LIPITOR) 40 MG tablet TAKE 1 TABLET AT BEDTIME   Blood Glucose Calibration (ACCU-CHEK AVIVA) SOLN Use to check blood sugar once daily   Blood Glucose Calibration (TRUE METRIX LEVEL 1) Low SOLN USE AS DIRECTED   Blood Glucose Monitoring Suppl (TRUE METRIX AIR GLUCOSE METER) w/Device KIT Use to check blood sugar daily   brimonidine (ALPHAGAN) 0.2 % ophthalmic solution brimonidine 0.2 % eye drops  INSTILL 1 DROP INTO BOTH EYES TWICE A DAY   Calcium Carb-Cholecalciferol (CALCIUM 600 + D) 600-5 MG-MCG TABS Take 2 tablets by mouth daily.   citalopram (CELEXA) 20 MG tablet TAKE 1 TABLET BY MOUTH EVERY DAY   diclofenac Sodium (VOLTAREN) 1 % GEL APPLY 2 GRAMS TOPICALLY TO THE AFFECTED AREA 4 TIMES DAILY.   esomeprazole (NEXIUM) 40 MG capsule TAKE 1 CAPSULE 2 (TWO) TIMES DAILY BEFORE A MEAL.   fluticasone (FLONASE) 50 MCG/ACT nasal spray USE 2 SPRAYS INTO THE NOSE DAILY AS DIRECTED (SUBSTITUTED FOR FLONASE)   furosemide (LASIX) 20 MG tablet TAKE 1 TABLET BY MOUTH EVERY DAY AS NEEDED   gabapentin (NEURONTIN) 100 MG capsule TAKE 1 CAPSULE  EVERY MORNING , 1 CAPSULE AT LUNCH, 1 CAPSULE AT DINNER, AND 2-3 CAPSULES AT BEDTIME   Glucosamine 500 MG TABS Take 500 mg by mouth daily.   glucose blood (TRUE METRIX BLOOD GLUCOSE TEST) test strip Use to check blood sugar daily   isosorbide mononitrate (IMDUR) 30 MG 24 hr tablet TAKE 1 TABLET EVERY DAY   KLOR-CON M20 20 MEQ tablet TAKE 1 TABLET BY MOUTH EVERY DAY   latanoprost (XALATAN) 0.005 % ophthalmic solution Place 1 drop into both eyes at bedtime.   lidocaine (LIDODERM) 5 % APPLY 1 PATCH ONTO THE SKIN EVERY DAY. REMOVE AND DISCARD PATCH WITHIN 12 HOURS OR AS DIRECTED BY PHYSICIAN   loratadine (CLARITIN) 10 MG tablet Take 10 mg by mouth daily.   losartan-hydrochlorothiazide (HYZAAR) 100-25 MG tablet Take 1 tablet by mouth daily.   Omega-3 Fatty Acids (FISH OIL) 1000 MG CAPS Take 1,000 mg by mouth daily.    polycarbophil (FIBERCON) 625 MG tablet  Take 625 mg by mouth daily.   tamoxifen (NOLVADEX) 20 MG tablet Take 1 tablet (20 mg total) by mouth daily.   tolterodine (DETROL LA) 4 MG 24 hr capsule TAKE 1 CAPSULE BY MOUTH EVERY DAY   traMADol (ULTRAM) 50 MG tablet TAKE 1 TABLET EVERY 6 HOURS AS NEEDED FOR PAIN   triamcinolone cream (KENALOG) 0.5 % Apply 1 application topically 2 (two) times daily.   TRUEplus Lancets 28G MISC TEST BLOOD SUGAR EVERY DAY    Allergies: Oxycodone, Sulfa antibiotics, and Sulfonamide derivatives  Social History   Tobacco Use   Smoking status: Never   Smokeless tobacco: Never  Vaping Use   Vaping Use: Never used  Substance Use Topics   Alcohol use: No   Drug use: No    Family History  Problem Relation Age of Onset   Breast cancer Other    Stomach cancer Other    Diabetes Other    Heart disease Other    Breast cancer Sister 43   Esophageal cancer Brother    Hypertension Mother    Heart attack Mother 65   Hypertension Father    Coronary artery disease Father 14       CABG   Stroke Father     Review of Systems: A 12-system review of systems  was performed and was negative except as noted in the HPI.  --------------------------------------------------------------------------------------------------  Physical Exam: BP 120/70 (BP Location: Left Arm, Patient Position: Sitting, Cuff Size: Normal)   Pulse 74   Ht 4' 11" (1.499 m)   Wt 132 lb 2 oz (59.9 kg)   SpO2 97%   BMI 26.69 kg/m   General:  NAD. Neck: No JVD or HJR. Lungs: Clear to auscultation bilaterally without wheezes or crackles. Heart: Regular rate and rhythm with 2/6 systolic murmur. Abdomen: Soft, nontender, nondistended. Extremities: Trace pretibial edema.  EKG: Normal sinus rhythm without abnormalities.  Lab Results  Component Value Date   WBC 5.6 09/10/2021   HGB 11.0 (L) 09/10/2021   HCT 35.8 (L) 09/10/2021   MCV 85.4 09/10/2021   PLT 189 09/10/2021    Lab Results  Component Value Date   NA 135 09/12/2021   K 3.1 (L) 09/12/2021   CL 101 09/12/2021   CO2 24 09/12/2021   BUN 15 09/12/2021   CREATININE 0.74 09/12/2021   GLUCOSE 120 (H) 09/12/2021   ALT 11 09/10/2021    Lab Results  Component Value Date   CHOL 123 01/30/2021   HDL 56.30 01/30/2021   LDLCALC 46 01/30/2021   LDLDIRECT 102.5 07/21/2009   TRIG 101.0 01/30/2021   CHOLHDL 2 01/30/2021    --------------------------------------------------------------------------------------------------  ASSESSMENT AND PLAN: Near syncope and heart murmur: Ms. Burpee has not passed out but has fallen several times.  Some falls were mechanical while others were due to orthostatic lightheadedness.  Her blood pressure today is normal.  She actually reports higher readings at home at times.  However, I am concerned about orthostatic blood pressure drops in the setting of her current medications.  We have agreed to decrease isosorbide mononitrate to 15 mg daily.  For now, we will continue her current dose of losartan-HCTZ, though de-escalation of this may also need to be considered in the future.  Given  temporal correlation between transition from Prozac to Celexa and worsening dizziness, I have asked Ms. Yaney to reach out to Dr. Diona Browner to see if de-escalation or discontinuation of Celexa would be helpful.  We will also obtain an echocardiogram to ensure  that she has not developed any new structural abnormalities to explain her symptoms, particularly given heart murmur again noted on exam today.  Coronary artery disease with stable angina: Ms. Asmus has a history of mild-moderate, nonobstructive CAD.  Her chronic intermittent chest pain has been absent on her antianginal regimen of low-dose isosorbide mononitrate.  As above, we will try decreasing this to see if it helps with her dizziness.  Continue atorvastatin to prevent progression of disease.  Ms. Boehm is not currently on antiplatelet therapy.  In the setting of her frequent falls over the last few months, I think it is reasonable to defer aspirin at this time.  Hypertension: Blood pressure normal today.  As above, we will decrease to 50 mg daily.  Continue losartan-HCTZ 100/25 mg daily.  Hyperlipidemia: LDL well controlled on last check.  Continue atorvastatin 40 mg daily.  Follow-up: Return to clinic in 3 months.  Nelva Bush, MD 10/12/2021 8:42 AM

## 2021-10-12 ENCOUNTER — Encounter: Payer: Self-pay | Admitting: Internal Medicine

## 2021-10-12 ENCOUNTER — Ambulatory Visit: Payer: Medicare HMO | Attending: Internal Medicine | Admitting: Internal Medicine

## 2021-10-12 VITALS — BP 120/70 | HR 74 | Ht 59.0 in | Wt 132.1 lb

## 2021-10-12 DIAGNOSIS — R55 Syncope and collapse: Secondary | ICD-10-CM | POA: Diagnosis not present

## 2021-10-12 DIAGNOSIS — I10 Essential (primary) hypertension: Secondary | ICD-10-CM | POA: Diagnosis not present

## 2021-10-12 DIAGNOSIS — E785 Hyperlipidemia, unspecified: Secondary | ICD-10-CM | POA: Diagnosis not present

## 2021-10-12 DIAGNOSIS — R011 Cardiac murmur, unspecified: Secondary | ICD-10-CM | POA: Diagnosis not present

## 2021-10-12 DIAGNOSIS — I25118 Atherosclerotic heart disease of native coronary artery with other forms of angina pectoris: Secondary | ICD-10-CM

## 2021-10-12 MED ORDER — ISOSORBIDE MONONITRATE ER 30 MG PO TB24: 15.0000 mg | ORAL_TABLET | Freq: Every day | ORAL | 3 refills | Status: DC

## 2021-10-12 NOTE — Progress Notes (Signed)
Reviewed cardiology notes. Have pt make appt to discuss possible SE from celexa .

## 2021-10-12 NOTE — Patient Instructions (Signed)
Medication Instructions:  1.Decrease imdur to 15 mg daily *If you need a refill on your cardiac medications before your next appointment, please call your pharmacy*   Lab Work: None If you have labs (blood work) drawn today and your tests are completely normal, you will receive your results only by: [place] (if you have MyChart) OR A paper copy in the mail If you have any lab test that is abnormal or we need to change your treatment, we will call you to review the results.   Testing/Procedures: Your physician has requested that you have an echocardiogram. Echocardiography is a painless test that uses sound waves to create images of your heart. It provides your doctor with information about the size and shape of your heart and how well your heart's chambers and valves are working. This procedure takes approximately one hour. There are no restrictions for this procedure.    Follow-Up: At [facility], you and your health needs are our priority.  As part of our continuing mission to provide you with exceptional heart care, we have created designated Provider Care Teams.  These Care Teams include your primary Cardiologist (physician) and Advanced Practice Providers (APPs -  Physician Assistants and Nurse Practitioners) who all work together to provide you with the care you need, when you need it.   Your next appointment:   3 month(s)  The format for your next appointment:   In Person  Provider:   You may see Nelva Bush, MD or one of the following Advanced Practice Providers on your designated Care Team:   Murray Hodgkins, NP Christell Faith, PA-C Cadence Kathlen Mody, PA-C Gerrie Nordmann, NP    Other Instructions Call your primary care provider to discuss decreasing or discontinuing citalopram.  Important Information About Sugar

## 2021-10-15 ENCOUNTER — Encounter: Payer: Self-pay | Admitting: *Deleted

## 2021-10-15 NOTE — Progress Notes (Signed)
Appointment scheduled with Dr. Diona Browner 10/16/21 at 10:00 am.

## 2021-10-15 NOTE — Telephone Encounter (Signed)
This encounter was created in error - please disregard.

## 2021-10-16 ENCOUNTER — Ambulatory Visit: Payer: Medicare HMO | Admitting: Family Medicine

## 2021-10-18 ENCOUNTER — Ambulatory Visit (INDEPENDENT_AMBULATORY_CARE_PROVIDER_SITE_OTHER): Payer: Medicare HMO | Admitting: Family Medicine

## 2021-10-18 ENCOUNTER — Encounter: Payer: Self-pay | Admitting: Family Medicine

## 2021-10-18 VITALS — BP 120/64 | HR 91 | Temp 98.1°F | Ht 59.0 in | Wt 130.5 lb

## 2021-10-18 DIAGNOSIS — F330 Major depressive disorder, recurrent, mild: Secondary | ICD-10-CM

## 2021-10-18 DIAGNOSIS — G43009 Migraine without aura, not intractable, without status migrainosus: Secondary | ICD-10-CM | POA: Diagnosis not present

## 2021-10-18 DIAGNOSIS — Z23 Encounter for immunization: Secondary | ICD-10-CM | POA: Diagnosis not present

## 2021-10-18 DIAGNOSIS — R42 Dizziness and giddiness: Secondary | ICD-10-CM | POA: Insufficient documentation

## 2021-10-18 MED ORDER — CITALOPRAM HYDROBROMIDE 10 MG PO TABS: 10.0000 mg | ORAL_TABLET | Freq: Every day | ORAL | 0 refills | Status: DC

## 2021-10-18 NOTE — Assessment & Plan Note (Signed)
Chronic, in remission She states this is not an issue for her and does not feel she was really on the SSRI for this. 

## 2021-10-18 NOTE — Assessment & Plan Note (Signed)
Chronic, initially started on Prozac around age [age] for migraine prevention.  She states she has not had a migraine in over 20 years.

## 2021-10-18 NOTE — Patient Instructions (Addendum)
Wean off citalopram slowly as discussed.   If still feeling unsteady. Try to wean off daytime gabapentin as able.  Can use lidocaine path on feet as need for foot pain during the day.

## 2021-10-18 NOTE — Progress Notes (Signed)
Patient ID: Ashley Ryan, female    DOB: 1945/08/06, 76 y.o.   MRN: [id number]  This visit was conducted in person.  BP 120/64   Pulse 91   Temp 98.1 F (36.7 C) (Oral)   Ht 4' 11" (1.499 m)   Wt 130 lb 8 oz (59.2 kg)   SpO2 96%   BMI 26.36 kg/m    CC:  Chief Complaint  Patient presents with   Medication Problem    Discuss possible side effects from Celexa    Subjective:   HPI: Ashley Ryan is a 76 y.o. female  history of nonobstructive coronary artery disease, HFpEF, mild pulmonary hypertension (RVSP 35-40% by echo in 10/2017), systemic hypertension, hiatal hernia, prediabetes, anemia, and arthritispresenting on 10/18/2021 for Medication Problem (Discuss possible side effects from Celexa)  She has been feeling unsteady feeling, lower blood pressure,  and fatigue  She has fallen 4 times since April... prior to 2 felt dizzy after sitting to standing. Has noted since changing from Prozac to celexa for interaction with cancer meds.   Reviewed OV note from Dr. [facility] 10/12/21  Given her symptoms.. he started by decreasing isosorbide to 15 mg daily. Plan and ECHO.     MDD,  well controlled on celexa 20 mg daily.   She states prozac was started for migraine in first place... age [age]  Occ headaches now but no migraines.  [place] Office Visit from 08/17/2021 in [place] at [facility] Total Score 0       BP Readings from Last 3 Encounters:  10/18/21 120/64  10/12/21 120/70  09/20/21 (!) 149/72   She is using gabapentin usually  for neuropathy or foot pain... usually only taking 100 mg in AM, and 300-400 mg at bedtime.        Relevant past medical, surgical, family and social history reviewed and updated as indicated. Interim medical history since our last visit reviewed. Allergies and medications reviewed and updated. Outpatient Medications Prior to Visit  Medication Sig Dispense Refill   Alcohol Swabs (DROPSAFE ALCOHOL PREP)  70 % PADS USE  TO TEST BLOOD SUGAR ONE TIME DAILY 100 each 3   atorvastatin (LIPITOR) 40 MG tablet TAKE 1 TABLET AT BEDTIME 90 tablet 3   Blood Glucose Calibration (ACCU-CHEK AVIVA) SOLN Use to check blood sugar once daily 1 each 0   Blood Glucose Calibration (TRUE METRIX LEVEL 1) Low SOLN USE AS DIRECTED 1 each 3   Blood Glucose Monitoring Suppl (TRUE METRIX AIR GLUCOSE METER) w/Device KIT Use to check blood sugar daily 1 kit 0   brimonidine (ALPHAGAN) 0.2 % ophthalmic solution brimonidine 0.2 % eye drops  INSTILL 1 DROP INTO BOTH EYES TWICE A DAY     Calcium Carb-Cholecalciferol (CALCIUM 600 + D) 600-5 MG-MCG TABS Take 2 tablets by mouth daily. 180 tablet 2   citalopram (CELEXA) 20 MG tablet TAKE 1 TABLET BY MOUTH EVERY DAY 90 tablet 0   diclofenac Sodium (VOLTAREN) 1 % GEL APPLY 2 GRAMS TOPICALLY TO THE AFFECTED AREA 4 TIMES DAILY. 700 g 11   esomeprazole (NEXIUM) 40 MG capsule TAKE 1 CAPSULE 2 (TWO) TIMES DAILY BEFORE A MEAL. 180 capsule 1   fluticasone (FLONASE) 50 MCG/ACT nasal spray USE 2 SPRAYS INTO THE NOSE DAILY AS DIRECTED (SUBSTITUTED FOR FLONASE) 48 g 3   furosemide (LASIX) 20 MG tablet TAKE 1 TABLET BY MOUTH EVERY DAY AS NEEDED 30 tablet 5   gabapentin (NEURONTIN) 100 MG  capsule TAKE 1 CAPSULE EVERY MORNING , 1 CAPSULE AT LUNCH, 1 CAPSULE AT DINNER, AND 2-3 CAPSULES AT BEDTIME 540 capsule 5   Glucosamine 500 MG TABS Take 500 mg by mouth daily.     glucose blood (TRUE METRIX BLOOD GLUCOSE TEST) test strip Use to check blood sugar daily 100 each 3   isosorbide mononitrate (IMDUR) 30 MG 24 hr tablet Take 0.5 tablets (15 mg total) by mouth daily. 45 tablet 3   KLOR-CON M20 20 MEQ tablet TAKE 1 TABLET BY MOUTH EVERY DAY 90 tablet 1   latanoprost (XALATAN) 0.005 % ophthalmic solution Place 1 drop into both eyes at bedtime.     lidocaine (LIDODERM) 5 % APPLY 1 PATCH ONTO THE SKIN EVERY DAY. REMOVE AND DISCARD PATCH WITHIN 12 HOURS OR AS DIRECTED BY PHYSICIAN 90 patch 3   loratadine  (CLARITIN) 10 MG tablet Take 10 mg by mouth daily.     losartan-hydrochlorothiazide (HYZAAR) 100-25 MG tablet Take 1 tablet by mouth daily.     Omega-3 Fatty Acids (FISH OIL) 1000 MG CAPS Take 1,000 mg by mouth daily.      polycarbophil (FIBERCON) 625 MG tablet Take 625 mg by mouth daily.     tamoxifen (NOLVADEX) 20 MG tablet Take 1 tablet (20 mg total) by mouth daily. 90 tablet 1   tolterodine (DETROL LA) 4 MG 24 hr capsule TAKE 1 CAPSULE BY MOUTH EVERY DAY 90 capsule 3   traMADol (ULTRAM) 50 MG tablet TAKE 1 TABLET EVERY 6 HOURS AS NEEDED FOR PAIN 90 tablet 0   triamcinolone cream (KENALOG) 0.5 % Apply 1 application topically 2 (two) times daily. 30 g 0   TRUEplus Lancets 28G MISC TEST BLOOD SUGAR EVERY DAY 100 each 3   No facility-administered medications prior to visit.     Per HPI unless specifically indicated in ROS section below Review of Systems  Constitutional:  Positive for fatigue. Negative for fever.  HENT:  Negative for congestion.   Eyes:  Negative for pain.  Respiratory:  Negative for cough and shortness of breath.   Cardiovascular:  Negative for chest pain, palpitations and leg swelling.  Gastrointestinal:  Negative for abdominal pain.  Genitourinary:  Negative for dysuria and vaginal bleeding.  Musculoskeletal:  Negative for back pain.  Neurological:  Positive for dizziness and light-headedness. Negative for syncope and headaches.  Psychiatric/Behavioral:  Negative for agitation, dysphoric mood, self-injury, sleep disturbance and suicidal ideas. The patient is not nervous/anxious.    Objective:  BP 120/64   Pulse 91   Temp 98.1 F (36.7 C) (Oral)   Ht 4' 11" (1.499 m)   Wt 130 lb 8 oz (59.2 kg)   SpO2 96%   BMI 26.36 kg/m   Wt Readings from Last 3 Encounters:  10/18/21 130 lb 8 oz (59.2 kg)  10/12/21 132 lb 2 oz (59.9 kg)  09/19/21 131 lb (59.4 kg)      Physical Exam Constitutional:      General: She is not in acute distress.    Appearance: Normal  appearance. She is well-developed. She is not ill-appearing or toxic-appearing.  HENT:     Head: Normocephalic.     Right Ear: Hearing, tympanic membrane, ear canal and external ear normal. Tympanic membrane is not erythematous, retracted or bulging.     Left Ear: Hearing, tympanic membrane, ear canal and external ear normal. Tympanic membrane is not erythematous, retracted or bulging.     Nose: No mucosal edema or rhinorrhea.  Right Sinus: No maxillary sinus tenderness or frontal sinus tenderness.     Left Sinus: No maxillary sinus tenderness or frontal sinus tenderness.     Mouth/Throat:     Pharynx: Uvula midline.  Eyes:     General: Lids are normal. Lids are everted, no foreign bodies appreciated.     Conjunctiva/sclera: Conjunctivae normal.     Pupils: Pupils are equal, round, and reactive to light.  Neck:     Thyroid: No thyroid mass or thyromegaly.     Vascular: No carotid bruit.     Trachea: Trachea normal.  Cardiovascular:     Rate and Rhythm: Normal rate and regular rhythm.     Pulses: Normal pulses.     Heart sounds: Normal heart sounds, S1 normal and S2 normal. No murmur heard.    No friction rub. No gallop.  Pulmonary:     Effort: Pulmonary effort is normal. No tachypnea or respiratory distress.     Breath sounds: Normal breath sounds. No decreased breath sounds, wheezing, rhonchi or rales.  Abdominal:     General: Bowel sounds are normal.     Palpations: Abdomen is soft.     Tenderness: There is no abdominal tenderness.  Musculoskeletal:     Cervical back: Normal range of motion and neck supple.  Skin:    General: Skin is warm and dry.     Findings: No rash.  Neurological:     Mental Status: She is alert.  Psychiatric:        Mood and Affect: Mood is not anxious or depressed.        Speech: Speech normal.        Behavior: Behavior normal. Behavior is cooperative.        Thought Content: Thought content normal.        Judgment: Judgment normal.        Results for orders placed or performed in visit on 03/88/82  Basic metabolic panel  Result Value Ref Range   Sodium 135 135 - 145 mmol/L   Potassium 3.1 (L) 3.5 - 5.1 mmol/L   Chloride 101 98 - 111 mmol/L   CO2 24 22 - 32 mmol/L   Glucose, Bld 120 (H) 70 - 99 mg/dL   BUN 15 8 - 23 mg/dL   Creatinine, Ser 0.74 0.44 - 1.00 mg/dL   Calcium 8.7 (L) 8.9 - 10.3 mg/dL   GFR, Estimated >60 >60 mL/min   Anion gap 10 5 - 15     COVID 19 screen:  No recent travel or known exposure to COVID19 The patient denies respiratory symptoms of COVID 19 at this time. The importance of social distancing was discussed today.   Assessment and Plan Problem List Items Addressed This Visit     MDD (major depressive disorder), mild ([place]) - Primary (Chronic)    Chronic, in remission She states this is not an issue for her and does not feel she was really on the SSRI for this.      Relevant Medications   citalopram (CELEXA) 10 MG tablet   Dizziness    Acute, she feels this is associated with the change from Prozac to Celexa given the interaction that Prozac has with her cancer medication.  Of note she is also on additional sedating medication such as gabapentin for chronic neuropathy and low back pain. We will start with a slow wean off of Celexa using 20 mg alternating with 10 mg gradually decreasing over time.  Reviewed weaning schedule in  detail with the patient.  If her symptoms do not seem to improve coming off Celexa she will also try to wean down her gabapentin dose.      Migraine without aura    Chronic, initially started on Prozac around age [age] for migraine prevention.  She states she has not had a migraine in over 20 years.      Relevant Medications   citalopram (CELEXA) 10 MG tablet   Other Visit Diagnoses     Need for influenza vaccination       Relevant Orders   Flu Vaccine QUAD High Dose(Fluad) (Completed)          Eliezer Lofts, MD

## 2021-10-18 NOTE — Assessment & Plan Note (Signed)
Acute, she feels this is associated with the change from Prozac to Celexa given the interaction that Prozac has with her cancer medication.  Of note she is also on additional sedating medication such as gabapentin for chronic neuropathy and low back pain. We will start with a slow wean off of Celexa using 20 mg alternating with 10 mg gradually decreasing over time.  Reviewed weaning schedule in detail with the patient.  If her symptoms do not seem to improve coming off Celexa she will also try to wean down her gabapentin dose.

## 2021-11-10 ENCOUNTER — Other Ambulatory Visit: Payer: Self-pay | Admitting: Family Medicine

## 2021-11-13 DIAGNOSIS — H353131 Nonexudative age-related macular degeneration, bilateral, early dry stage: Secondary | ICD-10-CM | POA: Diagnosis not present

## 2021-11-13 DIAGNOSIS — H401112 Primary open-angle glaucoma, right eye, moderate stage: Secondary | ICD-10-CM | POA: Diagnosis not present

## 2021-11-13 DIAGNOSIS — H401123 Primary open-angle glaucoma, left eye, severe stage: Secondary | ICD-10-CM | POA: Diagnosis not present

## 2021-11-13 DIAGNOSIS — E119 Type 2 diabetes mellitus without complications: Secondary | ICD-10-CM | POA: Diagnosis not present

## 2021-11-13 LAB — HM DIABETES EYE EXAM

## 2021-11-14 ENCOUNTER — Other Ambulatory Visit: Payer: Self-pay | Admitting: Family Medicine

## 2021-11-15 ENCOUNTER — Ambulatory Visit: Payer: Medicare HMO | Attending: Internal Medicine

## 2021-11-15 DIAGNOSIS — R011 Cardiac murmur, unspecified: Secondary | ICD-10-CM

## 2021-11-15 DIAGNOSIS — R55 Syncope and collapse: Secondary | ICD-10-CM

## 2021-11-15 LAB — ECHOCARDIOGRAM COMPLETE
AR max vel: 2.45 cm2
AV Area VTI: 2.37 cm2
AV Area mean vel: 2.22 cm2
AV Mean grad: 3 mmHg
AV Peak grad: 6.2 mmHg
Ao pk vel: 1.24 m/s
Area-P 1/2: 3.11 cm2
S' Lateral: 1.3 cm

## 2021-11-19 ENCOUNTER — Encounter: Payer: Self-pay | Admitting: Family Medicine

## 2021-11-23 ENCOUNTER — Telehealth: Payer: Self-pay

## 2021-11-23 NOTE — Telephone Encounter (Signed)
-----   Message from Nelva Bush, MD sent at 11/19/2021  1:51 PM EDT ----- Please let Ashley Ryan know that her echo shows that her heart is contracting well.  Mild leakage of the mitral valve is noted.  No significant valvular abnormalities are observed.  I recommend that Ashley Ryan continue her current medications and follow-up as scheduled in December.  She should let us know if any questions or concerns arise in the meantime.

## 2021-11-23 NOTE — Telephone Encounter (Signed)
Left message for pt to call back  °

## 2021-11-26 NOTE — Telephone Encounter (Signed)
Patient made aware of results and verbalized understanding.  

## 2021-12-07 ENCOUNTER — Other Ambulatory Visit: Payer: Self-pay | Admitting: Family Medicine

## 2021-12-07 DIAGNOSIS — E1140 Type 2 diabetes mellitus with diabetic neuropathy, unspecified: Secondary | ICD-10-CM

## 2021-12-31 ENCOUNTER — Telehealth: Payer: Self-pay | Admitting: Family Medicine

## 2021-12-31 MED ORDER — ESCITALOPRAM OXALATE 5 MG PO TABS: 5.0000 mg | ORAL_TABLET | Freq: Every day | ORAL | 3 refills | Status: DC

## 2021-12-31 NOTE — Telephone Encounter (Signed)
Patient calling in requesting medication to help her with sadness and crying  Caring for sick husband, can watch TV and begin crying  No longer on celexa, been off for about 2 weeks and symptoms started a little after that (pt states MD is aware that she was weaning off this medication  Made an appointment for Wednesday 12.13.23 in the AM, but not sure if MD will just need to try a medication adjustment since she is scheduled to see MD in January  Please call the patient if appointment not needed

## 2021-12-31 NOTE — Telephone Encounter (Signed)
Prozac interacts with tamoxifem  SE to celexa.. now off Not doing well off altogether.  Will have her start lexapro 5 mg daily ( no interactions with tamoxifem) Cancel Wed appt , but make sure she  keeps appt  in [place] for follow. Have call back sooner if continued issues or SE to lexapro.

## 2021-12-31 NOTE — Telephone Encounter (Signed)
Mrs. Maskell notified as instructed by telephone.  Patient states understanding.  Appointment for Wednesday cancelled.

## 2022-01-01 ENCOUNTER — Ambulatory Visit: Payer: Medicare HMO | Admitting: Family Medicine

## 2022-01-02 ENCOUNTER — Ambulatory Visit: Payer: Medicare HMO | Admitting: Family Medicine

## 2022-01-11 ENCOUNTER — Ambulatory Visit: Payer: Medicare HMO | Admitting: Internal Medicine

## 2022-01-22 ENCOUNTER — Encounter: Payer: Self-pay | Admitting: Urgent Care

## 2022-01-23 ENCOUNTER — Other Ambulatory Visit: Payer: Self-pay | Admitting: Family Medicine

## 2022-01-23 NOTE — Progress Notes (Signed)
Zsofia Prout T. Lurlene Ronda, MD, [place] [facility] [place], [address]  Phone: [phone]  FAX: [phone]  Ashley Ryan - 77 y.o. female  MRN [id number]  Date of Birth: [birth year]  Date: 01/24/2022  PCP: [name], MD  Referral: [name], MD  Chief Complaint  Patient presents with   Knee Pain    C/o B knee pain, worse in R. H/o L TKR.    Subjective:   Ashley Ryan is a 77 y.o. very pleasant female patient with Body mass index is 26.33 kg/m. who presents with the following:  She is a very well-known patient, who have seen for many times over the years.  She presents today for some ongoing knee pain.  I last saw her in August for some knee osteoarthritis.  Right-sided knee pain: She continues to be plagued with right-sided knee pain.  She has pain medially and laterally on the joint line.  I have not imaged her knee in several years, but even the last time I evaluated the knee radiographically, she did have some severe degenerative change.  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 PULM: Breathing comfortably in no respiratory distress PSYCH: Normally interactive.   Considerable medial lateral joint line tenderness also she has some patellar crepitus with motion. Stable to varus and valgus stress.  ACL and PCL are intact.  Any kind of forced flexion causes pain.  Laboratory and Imaging Data:  Assessment and Plan:     ICD-10-CM   1. Primary osteoarthritis of right knee  M17.11 triamcinolone acetonide (KENALOG-40) injection 40 mg    DISCONTINUED: triamcinolone acetonide  (KENALOG-40) injection 40 mg     We will do a therapeutic injection of corticosteroid today, and this was done via medial approach.  She had good relief of symptoms afterwards, and given her longstanding knee pain I do think she would very much benefit from a trial of viscosupplementation.  Distantly, did do a left-sided knee viscosupplementation series with good result prior to her total knee arthroplasty.  Aspiration/Injection Procedure Note CERYS Ryan May 31, 1945 Date of procedure: 01/24/2022  Procedure: Large Joint Aspiration / Injection of Knee, R Indications: Pain  Procedure Details Patient verbally consented to procedure. Risks, benefits, and alternatives explained. Sterilely prepped with Chloraprep. Ethyl cholride used for anesthesia. 9 cc Lidocaine 1% mixed with 1 mL of Kenalog 40 mg injected using the anteromedial approach without difficulty. No complications with procedure and tolerated well. Patient had decreased pain post-injection. Medication: 1 mL of Kenalog 40 mg   Medication Management during today's office visit: Meds ordered this encounter  Medications   fluticasone (FLONASE) 50 MCG/ACT nasal spray    Sig: USE 2 SPRAYS INTO THE NOSE DAILY AS DIRECTED (SUBSTITUTED FOR FLONASE)    Dispense:  48 g    Refill:  3   DISCONTD: triamcinolone acetonide (KENALOG-40) injection 40 mg   triamcinolone acetonide (KENALOG-40) injection 40 mg   Medications Discontinued During This Encounter  Medication Reason  fluticasone (FLONASE) 50 MCG/ACT nasal spray Reorder   triamcinolone acetonide (KENALOG-40) injection 40 mg     Orders placed today for conditions managed today: No orders of the defined types were placed in this encounter.   Disposition: No follow-ups on file.  [employer] speech-to-text software was used for transcription in this dictation.  Possible transcriptional errors can occur using Editor, commissioning.   Signed,  [name]. Dionta Larke, MD   Outpatient  Encounter Medications as of 01/24/2022  Medication Sig   Alcohol Swabs (DROPSAFE ALCOHOL PREP) 70 % PADS USE  TO TEST BLOOD SUGAR ONE TIME DAILY   atorvastatin (LIPITOR) 40 MG tablet TAKE 1 TABLET AT BEDTIME   Blood Glucose Calibration (ACCU-CHEK AVIVA) SOLN Use to check blood sugar once daily   Blood Glucose Calibration (TRUE METRIX LEVEL 1) Low SOLN USE AS DIRECTED   Blood Glucose Monitoring Suppl (TRUE METRIX AIR GLUCOSE METER) w/Device KIT Use to check blood sugar daily   brimonidine (ALPHAGAN) 0.2 % ophthalmic solution brimonidine 0.2 % eye drops  INSTILL 1 DROP INTO BOTH EYES TWICE A DAY   Calcium Carb-Cholecalciferol (CALCIUM 600 + D) 600-5 MG-MCG TABS Take 2 tablets by mouth daily.   diclofenac Sodium (VOLTAREN) 1 % GEL APPLY 2 GRAMS TOPICALLY TO THE AFFECTED AREA 4 TIMES DAILY.   esomeprazole (NEXIUM) 40 MG capsule TAKE 1 CAPSULE 2 (TWO) TIMES DAILY BEFORE A MEAL.   furosemide (LASIX) 20 MG tablet TAKE 1 TABLET BY MOUTH EVERY DAY AS NEEDED   gabapentin (NEURONTIN) 100 MG capsule TAKE 1 CAPSULE EVERY MORNING , 1 CAPSULE AT LUNCH, 1 CAPSULE AT DINNER, AND 2-3 CAPSULES AT BEDTIME   Glucosamine 500 MG TABS Take 500 mg by mouth daily.   glucose blood (TRUE METRIX BLOOD GLUCOSE TEST) test strip Use to check blood sugar daily   isosorbide mononitrate (IMDUR) 30 MG 24 hr tablet Take 0.5 tablets (15 mg total) by mouth daily.   KLOR-CON M20 20 MEQ tablet TAKE 1 TABLET BY MOUTH EVERY DAY   latanoprost (XALATAN) 0.005 % ophthalmic solution Place 1 drop into both eyes at bedtime.   lidocaine (LIDODERM) 5 % APPLY 1 PATCH ONTO THE SKIN EVERY DAY. REMOVE AND DISCARD PATCH WITHIN 12 HOURS OR AS DIRECTED BY PHYSICIAN   loratadine (CLARITIN) 10 MG tablet Take 10 mg by mouth daily.   losartan-hydrochlorothiazide (HYZAAR) 100-25 MG tablet Take 1 tablet by mouth daily.   Omega-3 Fatty Acids (FISH OIL) 1000 MG CAPS Take 1,000 mg by mouth daily.    polycarbophil (FIBERCON) 625 MG tablet Take 625 mg by mouth  daily.   tamoxifen (NOLVADEX) 20 MG tablet Take 1 tablet (20 mg total) by mouth daily.   tolterodine (DETROL LA) 4 MG 24 hr capsule TAKE 1 CAPSULE BY MOUTH EVERY DAY   traMADol (ULTRAM) 50 MG tablet TAKE 1 TABLET EVERY 6 HOURS AS NEEDED FOR PAIN   triamcinolone cream (KENALOG) 0.5 % Apply 1 application topically 2 (two) times daily.   TRUEplus Lancets 28G MISC TEST BLOOD SUGAR EVERY DAY   [DISCONTINUED] fluticasone (FLONASE) 50 MCG/ACT nasal spray USE 2 SPRAYS INTO THE NOSE DAILY AS DIRECTED (SUBSTITUTED FOR FLONASE)   escitalopram (LEXAPRO) 5 MG tablet TAKE 1 TABLET (5 MG TOTAL) BY MOUTH DAILY. (Patient not taking: Reported on 01/24/2022)   fluticasone (FLONASE) 50 MCG/ACT nasal spray USE 2 SPRAYS INTO THE NOSE DAILY AS DIRECTED (SUBSTITUTED FOR FLONASE)   [DISCONTINUED] escitalopram (LEXAPRO) 5 MG tablet Take 1 tablet (5 mg total) by mouth daily.   [  EXPIRED] triamcinolone acetonide (KENALOG-40) injection 40 mg    [DISCONTINUED] triamcinolone acetonide (KENALOG-40) injection 40 mg    No facility-administered encounter medications on file as of 01/24/2022.

## 2022-01-24 ENCOUNTER — Ambulatory Visit (INDEPENDENT_AMBULATORY_CARE_PROVIDER_SITE_OTHER): Payer: Medicare PPO | Admitting: Family Medicine

## 2022-01-24 VITALS — BP 138/86 | HR 82 | Temp 98.1°F | Ht 59.0 in | Wt 130.4 lb

## 2022-01-24 DIAGNOSIS — M1711 Unilateral primary osteoarthritis, right knee: Secondary | ICD-10-CM | POA: Diagnosis not present

## 2022-01-24 MED ORDER — TRIAMCINOLONE ACETONIDE 40 MG/ML IJ SUSP
40.0000 mg | Freq: Once | INTRAMUSCULAR | Status: AC
  Administered 2022-01-24: 40 mg via INTRA_ARTICULAR

## 2022-01-24 MED ORDER — TRIAMCINOLONE ACETONIDE 40 MG/ML IJ SUSP: 40.0000 mg | Freq: Once | INTRAMUSCULAR | Status: DC

## 2022-01-24 MED ORDER — FLUTICASONE PROPIONATE 50 MCG/ACT NA SUSP: NASAL | 3 refills | Status: DC

## 2022-01-26 ENCOUNTER — Encounter: Payer: Self-pay | Admitting: Family Medicine

## 2022-01-31 ENCOUNTER — Other Ambulatory Visit: Payer: Self-pay | Admitting: Oncology

## 2022-02-01 ENCOUNTER — Telehealth: Payer: Self-pay | Admitting: Family Medicine

## 2022-02-01 DIAGNOSIS — E1140 Type 2 diabetes mellitus with diabetic neuropathy, unspecified: Secondary | ICD-10-CM

## 2022-02-01 DIAGNOSIS — E538 Deficiency of other specified B group vitamins: Secondary | ICD-10-CM

## 2022-02-01 DIAGNOSIS — D509 Iron deficiency anemia, unspecified: Secondary | ICD-10-CM

## 2022-02-01 NOTE — Telephone Encounter (Signed)
-----  Message from Velna Hatchet, RT sent at 01/29/2022  3:43 PM EST ----- Regarding: Tue 1/23 lab Patient is scheduled for cpx, please order future labs.  Thanks, Anda Kraft

## 2022-02-12 ENCOUNTER — Ambulatory Visit (INDEPENDENT_AMBULATORY_CARE_PROVIDER_SITE_OTHER): Payer: Medicare PPO

## 2022-02-12 ENCOUNTER — Other Ambulatory Visit (INDEPENDENT_AMBULATORY_CARE_PROVIDER_SITE_OTHER): Payer: Medicare PPO

## 2022-02-12 VITALS — Ht 59.0 in | Wt 130.0 lb

## 2022-02-12 DIAGNOSIS — E1140 Type 2 diabetes mellitus with diabetic neuropathy, unspecified: Secondary | ICD-10-CM

## 2022-02-12 DIAGNOSIS — Z0000 Encounter for general adult medical examination without abnormal findings: Secondary | ICD-10-CM | POA: Diagnosis not present

## 2022-02-12 LAB — LIPID PANEL
Cholesterol: 125 mg/dL (ref 0–200)
HDL: 59.8 mg/dL (ref >39.00)
LDL Cholesterol: 44 mg/dL (ref 0–99)
NonHDL: 65.25
Total CHOL/HDL Ratio: 2
Triglycerides: 104 mg/dL (ref 0.0–149.0)
VLDL: 20.8 mg/dL (ref 0.0–40.0)

## 2022-02-12 LAB — HEMOGLOBIN A1C: Hgb A1c MFr Bld: 6.2 % (ref 4.6–6.5)

## 2022-02-12 LAB — MICROALBUMIN / CREATININE URINE RATIO
Creatinine,U: 113.4 mg/dL
Microalb Creat Ratio: 0.6 mg/g (ref 0.0–30.0)
Microalb, Ur: <0.7 mg/dL (ref 0.0–1.9)

## 2022-02-12 NOTE — Progress Notes (Signed)
Virtual Visit via Telephone Note  I connected with  Ashley Ryan on 02/12/22 at  9:15 AM EST by telephone and verified that I am speaking with the correct person using two identifiers.  Location: Patient: home Provider: [place] Persons participating in the virtual visit: [place]   I discussed the limitations, risks, security and privacy concerns of performing an evaluation and management service by telephone and the availability of in person appointments. The patient expressed understanding and agreed to proceed.  Interactive audio and video telecommunications were attempted between this nurse and patient, however failed, due to patient having technical difficulties OR patient did not have access to video capability.  We continued and completed visit with audio only.  Some vital signs may be absent or patient reported.   Dionisio David, LPN  Subjective:   Ashley Ryan is a 77 y.o. female who presents for an Initial Medicare Annual Wellness Visit.  Review of Systems     Cardiac Risk Factors include: advanced age (>40mn, >>59women);diabetes mellitus;dyslipidemia     Objective:    There were no vitals filed for this visit. There is no height or weight on file to calculate BMI.     02/12/2022    9:14 AM 03/14/2021    1:02 PM 09/07/2020    1:29 PM 08/09/2019    5:00 PM 06/04/2019    7:49 AM 02/10/2019    2:57 PM 12/15/2018   12:18 PM  Advanced Directives  Does Patient Have a Medical Advance Directive? No No No No No No No  Would patient like information on creating a medical advance directive? No - Patient declined  No - Patient declined No - Patient declined No - Patient declined No - Patient declined No - Patient declined    Current Medications (verified) Outpatient Encounter Medications as of 02/12/2022  Medication Sig   Alcohol Swabs (DROPSAFE ALCOHOL PREP) 70 % PADS USE  TO TEST BLOOD SUGAR ONE TIME DAILY   atorvastatin (LIPITOR) 40 MG  tablet TAKE 1 TABLET AT BEDTIME   Blood Glucose Calibration (ACCU-CHEK AVIVA) SOLN Use to check blood sugar once daily   Blood Glucose Calibration (TRUE METRIX LEVEL 1) Low SOLN USE AS DIRECTED   Blood Glucose Monitoring Suppl (TRUE METRIX AIR GLUCOSE METER) w/Device KIT Use to check blood sugar daily   brimonidine (ALPHAGAN) 0.2 % ophthalmic solution brimonidine 0.2 % eye drops  INSTILL 1 DROP INTO BOTH EYES TWICE A DAY   Calcium Carb-Cholecalciferol (CALCIUM 600 + D) 600-5 MG-MCG TABS Take 2 tablets by mouth daily.   diclofenac Sodium (VOLTAREN) 1 % GEL APPLY 2 GRAMS TOPICALLY TO THE AFFECTED AREA 4 TIMES DAILY.   esomeprazole (NEXIUM) 40 MG capsule TAKE 1 CAPSULE 2 (TWO) TIMES DAILY BEFORE A MEAL.   fluticasone (FLONASE) 50 MCG/ACT nasal spray USE 2 SPRAYS INTO THE NOSE DAILY AS DIRECTED (SUBSTITUTED FOR FLONASE)   furosemide (LASIX) 20 MG tablet TAKE 1 TABLET BY MOUTH EVERY DAY AS NEEDED   gabapentin (NEURONTIN) 100 MG capsule TAKE 1 CAPSULE EVERY MORNING , 1 CAPSULE AT LUNCH, 1 CAPSULE AT DINNER, AND 2-3 CAPSULES AT BEDTIME   Glucosamine 500 MG TABS Take 500 mg by mouth daily.   glucose blood (TRUE METRIX BLOOD GLUCOSE TEST) test strip Use to check blood sugar daily   isosorbide mononitrate (IMDUR) 30 MG 24 hr tablet Take 0.5 tablets (15 mg total) by mouth daily.   KLOR-CON M20 20 MEQ tablet TAKE 1 TABLET BY MOUTH  EVERY DAY   latanoprost (XALATAN) 0.005 % ophthalmic solution Place 1 drop into both eyes at bedtime.   lidocaine (LIDODERM) 5 % APPLY 1 PATCH ONTO THE SKIN EVERY DAY. REMOVE AND DISCARD PATCH WITHIN 12 HOURS OR AS DIRECTED BY PHYSICIAN   loratadine (CLARITIN) 10 MG tablet Take 10 mg by mouth daily.   losartan-hydrochlorothiazide (HYZAAR) 100-25 MG tablet Take 1 tablet by mouth daily.   Omega-3 Fatty Acids (FISH OIL) 1000 MG CAPS Take 1,000 mg by mouth daily.    polycarbophil (FIBERCON) 625 MG tablet Take 625 mg by mouth daily.   tamoxifen (NOLVADEX) 20 MG tablet TAKE 1 TABLET  EVERY DAY   tolterodine (DETROL LA) 4 MG 24 hr capsule TAKE 1 CAPSULE BY MOUTH EVERY DAY   traMADol (ULTRAM) 50 MG tablet TAKE 1 TABLET EVERY 6 HOURS AS NEEDED FOR PAIN   triamcinolone cream (KENALOG) 0.5 % Apply 1 application topically 2 (two) times daily.   TRUEplus Lancets 28G MISC TEST BLOOD SUGAR EVERY DAY   escitalopram (LEXAPRO) 5 MG tablet TAKE 1 TABLET (5 MG TOTAL) BY MOUTH DAILY. (Patient not taking: Reported on 01/24/2022)   No facility-administered encounter medications on file as of 02/12/2022.    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  Mastectomy/ SLN.   Chronic headache    Complication of anesthesia    prior to 1991 used to have PONV.  none recently.   Diverticulosis of colon    Family history of adverse reaction to anesthesia    mother/daighter get sick   GERD with stricture    Hard of hearing    Heart murmur    followed by PCP   Hiatal hernia    Hypertension    Neuropathy    feet   Osteoporosis    Pre-diabetes    Wears dentures    full upper   Past Surgical History:  Procedure Laterality Date   ABDOMINAL HYSTERECTOMY  1978   one ovary remains   BREAST BIOPSY Left 05/26/2017   Affirm Bx- coil clip Ductal carcinoma in situ, high nuclear grade with calcifications and focal comedonecrosis   CARDIAC CATHETERIZATION     "yrs ago" all OK.   CATARACT EXTRACTION W/PHACO Left 02/21/2016   Procedure: CATARACT EXTRACTION PHACO AND INTRAOCULAR LENS PLACEMENT ([place])  Left eye;  Surgeon: Leandrew Koyanagi, MD;  Location: [facility];  Service: Ophthalmology;  Laterality: Left;  Left eye Diabetic   CATARACT EXTRACTION W/PHACO Right 03/13/2016   Procedure: CATARACT EXTRACTION PHACO AND INTRAOCULAR LENS PLACEMENT ([place])  right  diabetic;  Surgeon: Leandrew Koyanagi, MD;   Location: [facility];  Service: Ophthalmology;  Laterality: Right;  diabetic   CHOLECYSTECTOMY  1978   COLONOSCOPY W/ POLYPECTOMY  09/09/2013   8 mm tubular adenoma of the cecum.  Erskine Emery, MD. [facility]   COLONOSCOPY WITH PROPOFOL N/A 06/04/2019   Procedure: COLONOSCOPY WITH PROPOFOL;  Surgeon: Lucilla Lame, MD;  Location: [facility] ENDOSCOPY;  Service: Endoscopy;  Laterality: N/A;   ESOPHAGOGASTRODUODENOSCOPY (EGD) WITH PROPOFOL N/A 06/04/2019   Procedure: ESOPHAGOGASTRODUODENOSCOPY (EGD) WITH PROPOFOL;  Surgeon: Lucilla Lame, MD;  Location: [facility] ENDOSCOPY;  Service: Endoscopy;  Laterality: N/A;   JOINT REPLACEMENT Left    knee    LEFT HEART CATH AND CORONARY ANGIOGRAPHY N/A 11/06/2017   Procedure: LEFT HEART  CATH AND CORONARY ANGIOGRAPHY;  Surgeon: Nelva Bush, MD;  Location: [facility] CV LAB;  Service: Cardiovascular;  Laterality: N/A;   MASTECTOMY Left 06/13/2017   mastectomy neg margins   OOPHORECTOMY     one still left   RENAL ANGIOGRAPHY  11/06/2017   Procedure: RENAL ANGIOGRAPHY;  Surgeon: Nelva Bush, MD;  Location: [place] CV LAB;  Service: Cardiovascular;;   SENTINEL NODE BIOPSY Left 06/13/2017   Procedure: SENTINEL NODE BIOPSY;  Surgeon: Robert Bellow, MD;  Location: [facility] ORS;  Service: General;  Laterality: Left;   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  Surgeon: Robert Bellow, MD;  Location: [facility] ORS;  Service: General;  Laterality: Left;   Family History  Problem Relation Age of Onset   Breast cancer Other    Stomach cancer Other    Diabetes Other    Heart disease Other    Breast cancer Sister 73   Esophageal cancer Brother    Hypertension Mother    Heart attack Mother 62   Hypertension Father    Coronary artery disease Father 8       CABG   Stroke Father    Social History   Socioeconomic History   Marital status: Married    Spouse name: Not on  file   Number of children: 2   Years of education: Not on file   Highest education level: Not on file  Occupational History   Occupation: retired cna    Employer: RETIRED  Tobacco Use   Smoking status: Never   Smokeless tobacco: Never  Vaping Use   Vaping Use: Never used  Substance and Sexual Activity   Alcohol use: No   Drug use: No   Sexual activity: Not Currently  Other Topics Concern   Not on file  Social History Narrative   Drinks sweet tea, diet sodas 4 a day   No living will, full code (reviewed 2014)   Social Determinants of Health   Financial Resource Strain: Low Risk  (02/12/2022)   Overall Financial Resource Strain (CARDIA)    Difficulty of Paying Living Expenses: Not hard at all  Food Insecurity: No Food Insecurity (02/12/2022)   Hunger Vital Sign    Worried About Running Out of Food in the Last Year: Never true    [place] in the Last Year: Never true  Transportation Needs: No Transportation Needs (02/12/2022)   PRAPARE - Hydrologist (Medical): No    Lack of Transportation (Non-Medical): No  Physical Activity: Inactive (02/12/2022)   Exercise Vital Sign    Days of Exercise per Week: 0 days    Minutes of Exercise per Session: 0 min  Stress: No Stress Concern Present (02/12/2022)   [place]    Feeling of Stress : Only a little  Social Connections: Moderately Isolated (02/12/2022)   Social Connection and Isolation Panel [NHANES]    Frequency of Communication with Friends and Family: More than three times a week    Frequency of Social Gatherings with Friends and Family: Once a week    Attends Religious Services: Never    Marine scientist or Organizations: No    Attends Archivist Meetings: Never    Marital Status: Married    Tobacco Counseling Counseling given: Not Answered   Clinical Intake:  Pre-visit preparation completed: Yes  Pain  :  No/denies pain     Diabetes: Yes CBG done?: No Did pt. bring in CBG monitor from home?: No  How often do you need to have someone help you when you read instructions, pamphlets, or other written materials from your doctor or pharmacy?: 1 - Never  Diabetic?yes Nutrition Risk Assessment:  Has the patient had any N/V/D within the last 2 months?  No  Does the patient have any non-healing wounds?  No  Has the patient had any unintentional weight loss or weight gain?  No   Diabetes:  Is the patient diabetic?  Yes  If diabetic, was a CBG obtained today?  No  Did the patient bring in their glucometer from home?  No  How often do you monitor your CBG's? occasionally.   Financial Strains and Diabetes Management:  Are you having any financial strains with the device, your supplies or your medication? No .  Does the patient want to be seen by Chronic Care Management for management of their diabetes?  No  Would the patient like to be referred to a Nutritionist or for Diabetic Management?  No   Diabetic Exams:  Diabetic Eye Exam: Completed 11/13/21. Marland Kitchen Pt has been advised about the importance in completing this exam.  Diabetic Foot Exam: Completed 01/30/21. Pt has been advised about the importance in completing this exam.    Interpreter Needed?: No  Information entered by :: Kirke Shaggy, LPN   Activities of Daily Living    02/12/2022    9:15 AM  In your present state of health, do you have any difficulty performing the following activities:  Hearing? 1  Vision? 0  Difficulty concentrating or making decisions? 0  Walking or climbing stairs? 1  Dressing or bathing? 0  Doing errands, shopping? 0  Preparing Food and eating ? N  Using the Toilet? N  In the past six months, have you accidently leaked urine? Y  Do you have problems with loss of bowel control? N  Managing your Medications? N  Managing your Finances? N  Housekeeping or managing your Housekeeping? N    Patient  Care Team: [name], MD as PCP - General (Family Medicine) End, Harrell Gave, MD as PCP - Cardiology (Cardiology) Leandrew Koyanagi, MD as Referring Physician (Ophthalmology) Bary Castilla Forest Gleason, MD (General Surgery) Earlie Server, MD as Consulting Physician (Oncology)  Indicate any recent Medical Services you may have received from other than [facility] providers in the past year (date may be approximate).     Assessment:   This is a routine wellness examination for Traci.  Hearing/Vision screen Hearing Screening - Comments:: Wears aids Vision Screening - Comments:: Wears glasses- [facility]  Dietary issues and exercise activities discussed: Current Exercise Habits: The patient does not participate in regular exercise at present   Goals Addressed             This Visit's Progress    DIET - EAT MORE FRUITS AND VEGETABLES         Depression Screen    02/12/2022    9:12 AM 08/17/2021   12:11 PM 01/30/2021    9:19 AM 07/27/2020    8:31 AM 01/28/2020   11:13 AM 12/15/2018   12:19 PM 11/12/2016    8:36 AM  PHQ 2/9 Scores  PHQ - 2 Score 0 0 0 0 0 0 0  PHQ- 9 Score 0 '2 1 2 1 '$ 0 0    Fall Risk    02/12/2022    9:14 AM 01/30/2021  8:20 AM 01/28/2020   10:02 AM 12/15/2018   12:19 PM 05/26/2018    8:36 AM  Fall Risk   Falls in the past year? '1 1 1 '$ 0 0  Number falls in past yr: '1 1 1 '$ 0 0  Injury with Fall? 1 0 0 0 0  Risk for fall due to : History of fall(s)   Medication side effect;Impaired balance/gait   Follow up Falls prevention discussed;Falls evaluation completed   Falls evaluation completed;Falls prevention discussed     FALL RISK PREVENTION PERTAINING TO THE HOME:  Any stairs in or around the home? Yes  If so, are there any without handrails? No  Home free of loose throw rugs in walkways, pet beds, electrical cords, etc? Yes  Adequate lighting in your home to reduce risk of falls? Yes   ASSISTIVE DEVICES UTILIZED TO PREVENT FALLS:  Life alert? No  Use of a cane,  walker or w/c? Yes  Grab bars in the bathroom? Yes  Shower chair or bench in shower? Yes  Elevated toilet seat or a handicapped toilet? Yes     Cognitive Function:    12/15/2018   12:22 PM 11/12/2016    8:45 AM 11/09/2015   10:20 AM  MMSE - Mini Mental State Exam  Orientation to time '5 5 5  '$ Orientation to Place '5 5 5  '$ Registration '3 3 3  '$ Attention/ Calculation 5 0 0  Recall '3 3 2  '$ Recall-comments   pt was unable to recall 1 of 3 words  Language- name 2 objects  0 0  Language- repeat '1 1 1  '$ Language- follow 3 step command  3 3  Language- read & follow direction  0 0  Write a sentence  0 0  Copy design  0 0  Total score  20 19        02/12/2022    9:20 AM  6CIT Screen  What Year? 0 points  What month? 0 points  What time? 3 points  Count back from 20 0 points  Months in reverse 0 points  Repeat phrase 0 points  Total Score 3 points    Immunizations Immunization History  Administered Date(s) Administered   Fluad Quad(high Dose 65+) 11/17/2018, 10/27/2019, 01/30/2021, 10/18/2021   Influenza Split 10/08/2010, 10/15/2011   Influenza Whole 11/03/2008, 11/16/2009   Influenza,inj,Quad PF,6+ Mos 10/29/2013, 11/01/2014, 11/09/2015, 12/05/2016, 10/30/2017   PFIZER(Purple Top)SARS-COV-2 Vaccination 02/25/2019, 03/18/2019   PPD Test 05/03/2011, 05/04/2012, 06/06/2015   Pneumococcal Conjugate-13 10/29/2013   Pneumococcal Polysaccharide-23 12/22/2007, 11/09/2015   Td 01/22/2007   Tdap 10/15/2011    TDAP status: Due, Education has been provided regarding the importance of this vaccine. Advised may receive this vaccine at local pharmacy or Health Dept. Aware to provide a copy of the vaccination record if obtained from local pharmacy or Health Dept. Verbalized acceptance and understanding.  Flu Vaccine status: Up to date  Pneumococcal vaccine status: Up to date  Covid-19 vaccine status: Completed vaccines  Qualifies for Shingles Vaccine? Yes   Zostavax completed No    Shingrix Completed?: No.    Education has been provided regarding the importance of this vaccine. Patient has been advised to call insurance company to determine out of pocket expense if they have not yet received this vaccine. Advised may also receive vaccine at local pharmacy or Health Dept. Verbalized acceptance and understanding.  Screening Tests Health Maintenance  Topic Date Due   Zoster Vaccines- Shingrix (1 of 2) Never done   COVID-19 Vaccine (  3 - Pfizer risk series) 04/15/2019   Diabetic kidney evaluation - Urine ACR  03/02/2021   DTaP/Tdap/Td (3 - Td or Tdap) 10/14/2021   FOOT EXAM  01/30/2022   HEMOGLOBIN A1C  02/17/2022   MAMMOGRAM  09/01/2022   Diabetic kidney evaluation - eGFR measurement  09/13/2022   OPHTHALMOLOGY EXAM  11/14/2022   Medicare Annual Wellness (AWV)  02/13/2023   COLONOSCOPY (Pts 45-55yr Insurance coverage will need to be confirmed)  06/03/2024   Pneumonia Vaccine 77 Years old  Completed   INFLUENZA VACCINE  Completed   DEXA SCAN  Completed   Hepatitis C Screening  Completed   HPV VACCINES  Aged Out    Health Maintenance  Health Maintenance Due  Topic Date Due   Zoster Vaccines- Shingrix (1 of 2) Never done   COVID-19 Vaccine (3 - Pfizer risk series) 04/15/2019   Diabetic kidney evaluation - Urine ACR  03/02/2021   DTaP/Tdap/Td (3 - Td or Tdap) 10/14/2021   FOOT EXAM  01/30/2022    Colorectal cancer screening: Type of screening: Colonoscopy. Completed 06/04/19. Repeat every 5 years  Mammogram status: Completed 09/07/21. Repeat every year  Bone Density status: Completed 08/31/21. Results reflect: Bone density results: OSTEOPOROSIS. Repeat every 2 years.  Lung Cancer Screening: (Low Dose CT Chest recommended if Age 79-80years, 30 pack-year currently smoking OR have quit w/in 15years.) does not qualify.   Additional Screening:  Hepatitis C Screening: does qualify; Completed 11/10/14  Vision Screening: Recommended annual ophthalmology exams  for early detection of glaucoma and other disorders of the eye. Is the patient up to date with their annual eye exam?  Yes  Who is the provider or what is the name of the office in which the patient attends annual eye exams? AMacyIf pt is not established with a provider, would they like to be referred to a provider to establish care? No .   Dental Screening: Recommended annual dental exams for proper oral hygiene  Community Resource Referral / Chronic Care Management: CRR required this visit?  No   CCM required this visit?  No      Plan:     I have personally reviewed and noted the following in the patient's chart:   Medical and social history Use of alcohol, tobacco or illicit drugs  Current medications and supplements including opioid prescriptions. Patient is not currently taking opioid prescriptions. Functional ability and status Nutritional status Physical activity Advanced directives List of other physicians Hospitalizations, surgeries, and ER visits in previous 12 months Vitals Screenings to include cognitive, depression, and falls Referrals and appointments  In addition, I have reviewed and discussed with patient certain preventive protocols, quality metrics, and best practice recommendations. A written personalized care plan for preventive services as well as general preventive health recommendations were provided to patient.     LDionisio David LPN   [id number]  Nurse Notes: none

## 2022-02-12 NOTE — Progress Notes (Signed)
No critical labs need to be addressed urgently. We will discuss labs in detail at upcoming office visit.   

## 2022-02-12 NOTE — Patient Instructions (Signed)
[name] , Thank you for taking time to come for your Medicare Wellness Visit. I appreciate your ongoing commitment to your health goals. Please review the following plan we discussed and let me know if I can assist you in the future.   These are the goals we discussed:  Goals      DIET - EAT MORE FRUITS AND VEGETABLES     Increase physical activity     Starting 11/12/2016, I will continue to exercise for at least 15 min daily.      Patient Stated     12/15/2018, I will try to start exercising more daily.         This is a list of the screening recommended for you and due dates:  Health Maintenance  Topic Date Due   Zoster (Shingles) Vaccine (1 of 2) Never done   COVID-19 Vaccine (3 - Pfizer risk series) 04/15/2019   Yearly kidney health urinalysis for diabetes  03/02/2021   DTaP/Tdap/Td vaccine (3 - Td or Tdap) 10/14/2021   Complete foot exam   01/30/2022   Hemoglobin A1C  02/17/2022   Mammogram  09/01/2022   Yearly kidney function blood test for diabetes  09/13/2022   Eye exam for diabetics  11/14/2022   Medicare Annual Wellness Visit  02/13/2023   Colon Cancer Screening  06/03/2024   Pneumonia Vaccine  Completed   Flu Shot  Completed   DEXA scan (bone density measurement)  Completed   Hepatitis C Screening: USPSTF Recommendation to screen - Ages 40-79 yo.  Completed   HPV Vaccine  Aged Out    Advanced directives: no  Conditions/risks identified: none  Next appointment: Follow up in one year for your annual wellness visit 02/14/23 @ 8:15 am by phone   Preventive Care 65 Years and Older, Female Preventive care refers to lifestyle choices and visits with your health care provider that can promote health and wellness. What does preventive care include? A yearly physical exam. This is also called an annual well check. Dental exams once or twice a year. Routine eye exams. Ask your health care provider how often you should have your eyes checked. Personal lifestyle  choices, including: Daily care of your teeth and gums. Regular physical activity. Eating a healthy diet. Avoiding tobacco and drug use. Limiting alcohol use. Practicing safe sex. Taking low-dose aspirin every day. Taking vitamin and mineral supplements as recommended by your health care provider. What happens during an annual well check? The services and screenings done by your health care provider during your annual well check will depend on your age, overall health, lifestyle risk factors, and family history of disease. Counseling  Your health care provider may ask you questions about your: Alcohol use. Tobacco use. Drug use. Emotional well-being. Home and relationship well-being. Sexual activity. Eating habits. History of falls. Memory and ability to understand (cognition). Work and work Statistician. Reproductive health. Screening  You may have the following tests or measurements: Height, weight, and BMI. Blood pressure. Lipid and cholesterol levels. These may be checked every 5 years, or more frequently if you are over 75 years old. Skin check. Lung cancer screening. You may have this screening every year starting at age 62 if you have a 30-pack-year history of smoking and currently smoke or have quit within the past 15 years. Fecal occult blood test (FOBT) of the stool. You may have this test every year starting at age 25. Flexible sigmoidoscopy or colonoscopy. You may have a sigmoidoscopy every 5 years  or a colonoscopy every 10 years starting at age 70. Hepatitis C blood test. Hepatitis B blood test. Sexually transmitted disease (STD) testing. Diabetes screening. This is done by checking your blood sugar (glucose) after you have not eaten for a while (fasting). You may have this done every 1-3 years. Bone density scan. This is done to screen for osteoporosis. You may have this done starting at age 61. Mammogram. This may be done every 1-2 years. Talk to your health care  provider about how often you should have regular mammograms. Talk with your health care provider about your test results, treatment options, and if necessary, the need for more tests. Vaccines  Your health care provider may recommend certain vaccines, such as: Influenza vaccine. This is recommended every year. Tetanus, diphtheria, and acellular pertussis (Tdap, Td) vaccine. You may need a Td booster every 10 years. Zoster vaccine. You may need this after age 71. Pneumococcal 13-valent conjugate (PCV13) vaccine. One dose is recommended after age 49. Pneumococcal polysaccharide (PPSV23) vaccine. One dose is recommended after age 70. Talk to your health care provider about which screenings and vaccines you need and how often you need them. This information is not intended to replace advice given to you by your health care provider. Make sure you discuss any questions you have with your health care provider. Document Released: 02/03/2015 Document Revised: 09/27/2015 Document Reviewed: 11/08/2014 Elsevier Interactive Patient Education  2017 [employer] Prevention in the Home Falls can cause injuries. They can happen to people of all ages. There are many things you can do to make your home safe and to help prevent falls. What can I do on the outside of my home? Regularly fix the edges of walkways and driveways and fix any cracks. Remove anything that might make you trip as you walk through a door, such as a raised step or threshold. Trim any bushes or trees on the path to your home. Use bright outdoor lighting. Clear any walking paths of anything that might make someone trip, such as rocks or tools. Regularly check to see if handrails are loose or broken. Make sure that both sides of any steps have handrails. Any raised decks and porches should have guardrails on the edges. Have any leaves, snow, or ice cleared regularly. Use sand or salt on walking paths during winter. Clean up any  spills in your garage right away. This includes oil or grease spills. What can I do in the bathroom? Use night lights. Install grab bars by the toilet and in the tub and shower. Do not use towel bars as grab bars. Use non-skid mats or decals in the tub or shower. If you need to sit down in the shower, use a plastic, non-slip stool. Keep the floor dry. Clean up any water that spills on the floor as soon as it happens. Remove soap buildup in the tub or shower regularly. Attach bath mats securely with double-sided non-slip rug tape. Do not have throw rugs and other things on the floor that can make you trip. What can I do in the bedroom? Use night lights. Make sure that you have a light by your bed that is easy to reach. Do not use any sheets or blankets that are too big for your bed. They should not hang down onto the floor. Have a firm chair that has side arms. You can use this for support while you get dressed. Do not have throw rugs and other things on the floor  that can make you trip. What can I do in the kitchen? Clean up any spills right away. Avoid walking on wet floors. Keep items that you use a lot in easy-to-reach places. If you need to reach something above you, use a strong step stool that has a grab bar. Keep electrical cords out of the way. Do not use floor polish or wax that makes floors slippery. If you must use wax, use non-skid floor wax. Do not have throw rugs and other things on the floor that can make you trip. What can I do with my stairs? Do not leave any items on the stairs. Make sure that there are handrails on both sides of the stairs and use them. Fix handrails that are broken or loose. Make sure that handrails are as long as the stairways. Check any carpeting to make sure that it is firmly attached to the stairs. Fix any carpet that is loose or worn. Avoid having throw rugs at the top or bottom of the stairs. If you do have throw rugs, attach them to the floor  with carpet tape. Make sure that you have a light switch at the top of the stairs and the bottom of the stairs. If you do not have them, ask someone to add them for you. What else can I do to help prevent falls? Wear shoes that: Do not have high heels. Have rubber bottoms. Are comfortable and fit you well. Are closed at the toe. Do not wear sandals. If you use a stepladder: Make sure that it is fully opened. Do not climb a closed stepladder. Make sure that both sides of the stepladder are locked into place. Ask someone to hold it for you, if possible. Clearly mark and make sure that you can see: Any grab bars or handrails. First and last steps. Where the edge of each step is. Use tools that help you move around (mobility aids) if they are needed. These include: Canes. Walkers. Scooters. Crutches. Turn on the lights when you go into a dark area. Replace any light bulbs as soon as they burn out. Set up your furniture so you have a clear path. Avoid moving your furniture around. If any of your floors are uneven, fix them. If there are any pets around you, be aware of where they are. Review your medicines with your doctor. Some medicines can make you feel dizzy. This can increase your chance of falling. Ask your doctor what other things that you can do to help prevent falls. This information is not intended to replace advice given to you by your health care provider. Make sure you discuss any questions you have with your health care provider. Document Released: 11/03/2008 Document Revised: 06/15/2015 Document Reviewed: 02/11/2014 Elsevier Interactive Patient Education  2017 [employer].

## 2022-02-19 ENCOUNTER — Encounter: Payer: Self-pay | Admitting: Family Medicine

## 2022-02-19 ENCOUNTER — Ambulatory Visit (INDEPENDENT_AMBULATORY_CARE_PROVIDER_SITE_OTHER): Payer: Medicare PPO | Admitting: Family Medicine

## 2022-02-19 VITALS — BP 152/82 | HR 83 | Temp 98.0°F | Ht 59.0 in | Wt 129.1 lb

## 2022-02-19 DIAGNOSIS — E538 Deficiency of other specified B group vitamins: Secondary | ICD-10-CM

## 2022-02-19 DIAGNOSIS — E1142 Type 2 diabetes mellitus with diabetic polyneuropathy: Secondary | ICD-10-CM | POA: Diagnosis not present

## 2022-02-19 DIAGNOSIS — E1140 Type 2 diabetes mellitus with diabetic neuropathy, unspecified: Secondary | ICD-10-CM

## 2022-02-19 DIAGNOSIS — E1169 Type 2 diabetes mellitus with other specified complication: Secondary | ICD-10-CM | POA: Diagnosis not present

## 2022-02-19 DIAGNOSIS — I272 Pulmonary hypertension, unspecified: Secondary | ICD-10-CM

## 2022-02-19 DIAGNOSIS — E1159 Type 2 diabetes mellitus with other circulatory complications: Secondary | ICD-10-CM

## 2022-02-19 DIAGNOSIS — Z0000 Encounter for general adult medical examination without abnormal findings: Secondary | ICD-10-CM

## 2022-02-19 DIAGNOSIS — I5032 Chronic diastolic (congestive) heart failure: Secondary | ICD-10-CM

## 2022-02-19 DIAGNOSIS — S22070A Wedge compression fracture of T9-T10 vertebra, initial encounter for closed fracture: Secondary | ICD-10-CM

## 2022-02-19 DIAGNOSIS — F330 Major depressive disorder, recurrent, mild: Secondary | ICD-10-CM | POA: Diagnosis not present

## 2022-02-19 DIAGNOSIS — I152 Hypertension secondary to endocrine disorders: Secondary | ICD-10-CM

## 2022-02-19 DIAGNOSIS — E785 Hyperlipidemia, unspecified: Secondary | ICD-10-CM

## 2022-02-19 DIAGNOSIS — N3281 Overactive bladder: Secondary | ICD-10-CM | POA: Insufficient documentation

## 2022-02-19 MED ORDER — SOLIFENACIN SUCCINATE 5 MG PO TABS: 5.0000 mg | ORAL_TABLET | Freq: Every day | ORAL | 11 refills | Status: DC

## 2022-02-19 NOTE — Assessment & Plan Note (Signed)
Stable, chronic.  Continue current medication.     Euvolemic. Lasix prn.

## 2022-02-19 NOTE — Patient Instructions (Addendum)
Stop Detrol LA and try a trial of Vesicare. Call in 4 weeks if symptoms not improved for possible increase in dose .  Can try a trial of Xyzal for allergies instead of Claritin.  Continue nasal saline and flonase.  Restart regular exercise, working on low carb diet, stop soda.  Consider  RSV, Shingrix and  Td at pharmacy.

## 2022-02-19 NOTE — Assessment & Plan Note (Signed)
Chronic, diet controlled. Associated with neuropathy.

## 2022-02-19 NOTE — Assessment & Plan Note (Signed)
Due for re-eval.   Compliant with atorvastatin 40 mg daily

## 2022-02-19 NOTE — Progress Notes (Signed)
The   Patient ID: Ashley Ryan, female    DOB: [birth year], 77 y.o.   MRN: [id number]  This visit was conducted in person.  BP (!) 150/88   Pulse 83   Temp 98 F (36.7 C) (Oral)   Ht '4\' 11"'$  (1.499 m)   Wt 129 lb 2 oz (58.6 kg)   SpO2 96%   BMI 26.08 kg/m    CC:  Chief Complaint  Patient presents with   Annual Exam    Part 2    Subjective:   HPI: Ashley Ryan is a 77 y.o. female presenting on 02/19/2022 for Annual Exam (Part 2)  The patient presents for complete physical and review of chronic health problems. She also has the following acute concerns today: OAB symptoms.  The patient saw a LPN or RN for medicare wellness visit.  Prevention and wellness was reviewed in detail. Note reviewed and important notes copied below.  Dr. 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 Tasia Ryan  Diabetes: Chronic, stable diet controlled. Lab Results  Component Value Date   HGBA1C 6.2 02/12/2022  Using medications without difficulties: Hypoglycemic episodes: Hyperglycemic episodes: Feet problems:none Blood Sugars averaging: Not checking eye exam within last year: Yes  High cholesterol: LDL at goal less than 70 given coronary artery disease on atorvastatin 40 mg daily Lab Results  Component Value Date   CHOL 125 02/12/2022   HDL 59.80 02/12/2022   LDLCALC 44 02/12/2022   LDLDIRECT 102.5 07/21/2009   TRIG 104.0 02/12/2022   CHOLHDL 2 02/12/2022   No exercise  Diet: moderate     Hypertension:  Above goal in office today but well-controlled at home and at other recent doctors visits  on  losartan HCTZ Using lasix  rarely prn,. BP Readings from Last 3 Encounters:  02/19/22 (!) 150/88  01/24/22 138/86  10/18/21 120/64  Using medication without problems or lightheadedness:  none Chest pain with exertion:  none Edema:  intermittent Short of breath: none Average home BPs: 818H systolic at home. Other issues:  HFpEF stable, pulmonary hypertension: Dr Ashley Ryan follows.  Euvolemic in office using Lasix as needed.  MDD, in remission. She had SE to meds in past.. does not want to take any med. Some increase in stress with husband with leukemia.   Chronic pain in back, right knee osteoarthritis.  Uses tramadol 50 mg 1 tablet daily Often takes tylenol as well. Seeing Dr. Lorelei Ryan for knee... plans possible hyaluronic acid injection.   OAB.Marland Kitchen  wears pad at night for incontinence... has urgency during the day, not severe frequency.  Detrol LA 4 mg helps minimally.. some dry mouth. Mirbegron interacts with tamoxifen.    Iron def anemia and B12 def.. followed by Dr. Tasia Ryan on iron infusions.  Relevant past medical, surgical, family and social history reviewed and updated as indicated. Interim medical history since our last visit reviewed. Allergies and medications reviewed and updated. Outpatient Medications Prior to Visit  Medication Sig Dispense Refill   Alcohol Swabs (DROPSAFE ALCOHOL PREP) 70 % PADS USE  TO TEST BLOOD SUGAR ONE TIME DAILY 100 each 3   atorvastatin (LIPITOR) 40 MG tablet TAKE 1 TABLET AT BEDTIME 90 tablet 3   Blood Glucose Calibration (ACCU-CHEK AVIVA) SOLN Use to check blood sugar once daily 1 each 0   Blood Glucose Calibration (TRUE METRIX  LEVEL 1) Low SOLN USE AS DIRECTED 1 each 3   Blood Glucose Monitoring Suppl (TRUE METRIX AIR GLUCOSE METER) w/Device KIT Use to check blood sugar daily 1 kit 0   brimonidine (ALPHAGAN) 0.2 % ophthalmic solution brimonidine 0.2 % eye drops  INSTILL 1 DROP INTO BOTH EYES TWICE A DAY     Calcium Carb-Cholecalciferol (CALCIUM 600 + D) 600-5 MG-MCG TABS Take 2 tablets by mouth daily. 180 tablet 2   diclofenac Sodium (VOLTAREN) 1 % GEL APPLY 2 GRAMS TOPICALLY TO THE AFFECTED AREA 4 TIMES DAILY. 700 g 11   esomeprazole (NEXIUM) 40 MG capsule TAKE 1 CAPSULE 2  (TWO) TIMES DAILY BEFORE A MEAL. 180 capsule 0   fluticasone (FLONASE) 50 MCG/ACT nasal spray USE 2 SPRAYS INTO THE NOSE DAILY AS DIRECTED (SUBSTITUTED FOR FLONASE) 48 g 3   furosemide (LASIX) 20 MG tablet TAKE 1 TABLET BY MOUTH EVERY DAY AS NEEDED 30 tablet 5   gabapentin (NEURONTIN) 100 MG capsule TAKE 1 CAPSULE EVERY MORNING , 1 CAPSULE AT LUNCH, 1 CAPSULE AT DINNER, AND 2-3 CAPSULES AT BEDTIME 540 capsule 5   Glucosamine 500 MG TABS Take 500 mg by mouth daily.     glucose blood (TRUE METRIX BLOOD GLUCOSE TEST) test strip Use to check blood sugar daily 100 each 3   isosorbide mononitrate (IMDUR) 30 MG 24 hr tablet Take 0.5 tablets (15 mg total) by mouth daily. 45 tablet 3   KLOR-CON M20 20 MEQ tablet TAKE 1 TABLET BY MOUTH EVERY DAY 90 tablet 1   latanoprost (XALATAN) 0.005 % ophthalmic solution Place 1 drop into both eyes at bedtime.     lidocaine (LIDODERM) 5 % APPLY 1 PATCH ONTO THE SKIN EVERY DAY. REMOVE AND DISCARD PATCH WITHIN 12 HOURS OR AS DIRECTED BY PHYSICIAN 90 patch 3   loratadine (CLARITIN) 10 MG tablet Take 10 mg by mouth daily.     losartan-hydrochlorothiazide (HYZAAR) 100-25 MG tablet Take 1 tablet by mouth daily.     Omega-3 Fatty Acids (FISH OIL) 1000 MG CAPS Take 1,000 mg by mouth daily.      polycarbophil (FIBERCON) 625 MG tablet Take 625 mg by mouth daily.     tamoxifen (NOLVADEX) 20 MG tablet TAKE 1 TABLET EVERY DAY 90 tablet 0   traMADol (ULTRAM) 50 MG tablet TAKE 1 TABLET EVERY 6 HOURS AS NEEDED FOR PAIN 90 tablet 0   triamcinolone cream (KENALOG) 0.5 % Apply 1 application topically 2 (two) times daily. 30 g 0   TRUEplus Lancets 28G MISC TEST BLOOD SUGAR EVERY DAY 100 each 3   tolterodine (DETROL LA) 4 MG 24 hr capsule TAKE 1 CAPSULE BY MOUTH EVERY DAY 90 capsule 3   escitalopram (LEXAPRO) 5 MG tablet TAKE 1 TABLET (5 MG TOTAL) BY MOUTH DAILY. (Patient not taking: Reported on 01/24/2022) 90 tablet 0   No facility-administered medications prior to visit.     Per HPI  unless specifically indicated in ROS section below Review of Systems  Constitutional:  Negative for fatigue and fever.  HENT:  Negative for congestion.   Eyes:  Negative for pain.  Respiratory:  Negative for cough and shortness of breath.   Cardiovascular:  Negative for chest pain, palpitations and leg swelling.  Gastrointestinal:  Negative for abdominal pain.  Genitourinary:  Negative for dysuria and vaginal bleeding.  Musculoskeletal:  Negative for back pain.  Neurological:  Negative for syncope, light-headedness and headaches.  Psychiatric/Behavioral:  Negative for dysphoric mood.     Objective:  BP (!) 150/88   Pulse 83   Temp 98 F (36.7 C) (Oral)   Ht '4\' 11"'$  (1.499 m)   Wt 129 lb 2 oz (58.6 kg)   SpO2 96%   BMI 26.08 kg/m   Wt Readings from Last 3 Encounters:  02/19/22 129 lb 2 oz (58.6 kg)  02/12/22 130 lb (59 kg)  01/24/22 130 lb 6 oz (59.1 kg)      Physical Exam Vitals and nursing note reviewed.  Constitutional:      General: She is not in acute distress.    Appearance: Normal appearance. She is well-developed. She is not ill-appearing or toxic-appearing.  HENT:     Head: Normocephalic.     Right Ear: Hearing, tympanic membrane, ear canal and external ear normal.     Left Ear: Hearing, tympanic membrane, ear canal and external ear normal.     Nose: Nose normal.  Eyes:     General: Lids are normal. Lids are everted, no foreign bodies appreciated.     Conjunctiva/sclera: Conjunctivae normal.     Pupils: Pupils are equal, round, and reactive to light.  Neck:     Thyroid: No thyroid mass or thyromegaly.     Vascular: No carotid bruit.     Trachea: Trachea normal.  Cardiovascular:     Rate and Rhythm: Normal rate and regular rhythm.     Heart sounds: Normal heart sounds, S1 normal and S2 normal. No murmur heard.    No gallop.  Pulmonary:     Effort: Pulmonary effort is normal. No respiratory distress.     Breath sounds: Normal breath sounds. No wheezing,  rhonchi or rales.  Abdominal:     General: Bowel sounds are normal. There is no distension or abdominal bruit.     Palpations: Abdomen is soft. There is no fluid wave or mass.     Tenderness: There is no abdominal tenderness. There is no guarding or rebound.     Hernia: No hernia is present.  Musculoskeletal:     Cervical back: Normal range of motion and neck supple.  Lymphadenopathy:     Cervical: No cervical adenopathy.  Skin:    General: Skin is warm and dry.     Findings: No rash.  Neurological:     Mental Status: She is alert.     Cranial Nerves: No cranial nerve deficit.     Sensory: No sensory deficit.  Psychiatric:        Mood and Affect: Mood is not anxious or depressed.        Speech: Speech normal.        Behavior: Behavior normal. Behavior is cooperative.        Judgment: Judgment normal.       Results for orders placed or performed in visit on 02/12/22  Microalbumin / creatinine urine ratio  Result Value Ref Range   Microalb, Ur <0.7 0.0 - 1.9 mg/dL   Creatinine,U 113.4 mg/dL   Microalb Creat Ratio 0.6 0.0 - 30.0 mg/g  Lipid panel  Result Value Ref Range   Cholesterol 125 0 - 200 mg/dL   Triglycerides 104.0 0.0 - 149.0 mg/dL   HDL 59.80 >39.00 mg/dL   VLDL 20.8 0.0 - 40.0 mg/dL   LDL Cholesterol 44 0 - 99 mg/dL   Total CHOL/HDL Ratio 2    NonHDL 65.25   Hemoglobin A1c  Result Value Ref Range   Hgb A1c MFr Bld 6.2 4.6 - 6.5 %    Assessment and  Plan The patient's preventative maintenance and recommended screening tests for an annual wellness exam were reviewed in full today. Brought up to date unless services declined.  Counselled on the importance of diet, exercise, and its role in overall health and mortality. The patient's FH and SH was reviewed, including their home life, tobacco status, and drug and alcohol status.   Vaccines: uptodate with COVID x 2, encouraged booster. Uptodate with flu shot today. Consider shingrix.  DEXA: Has stomach ulcer  11/2013 on PPI nexium. SE to fosamax 11/2014:  osteoprosis Started Boniva as of 10/2015, now off.   osteoporosis  Improved to osteopenia 2019,  2023 per Dr Ashley Ryan. Colon: 05/2019,  Dr. Radford Pax, repeat  5 year. Mammo: breast cancer, left, s/p left mastectomy. 08/2021 right asymmetry, resolved on diagnostic PAP/DVE: partial hysterectomy, no pap smear or DVE required,. No family history of ovarian cancer.    Hep C: neg   Routine general medical examination at a health care facility  B12 deficiency  MDD (major depressive disorder), mild ([place]) Assessment & Plan: Chronic, in remission She states this is not an issue for her and does not feel she was really on the SSRI for this.   Controlled type 2 diabetes with neuropathy [facility]) Assessment & Plan: Chronic, diet controlled. Associated with neuropathy.   Hypertension associated with diabetes ([place]) Assessment & Plan: Stable, chronic.  Continue current medication.   Losartan HCTZ 100/25 mg daily   Hyperlipidemia associated with type 2 diabetes mellitus ([place]) Assessment & Plan: Due for re-eval.   Compliant with atorvastatin 40 mg daily   Chronic heart failure with preserved ejection fraction ([facility]) Assessment & Plan: Stable, chronic.  Continue current medication.     Euvolemic. Lasix prn.     Pulmonary hypertension ([place]) Assessment & Plan: Followed by cardiology.   Diabetic peripheral neuropathy ([facility]) Assessment & Plan: Associated with diabetes.  Well-controlled with current gabapentin regimen..   Compression fracture of T10 vertebra, initial encounter [facility]) Assessment & Plan: Chronic, treated in past with vertebroplasty.  Causes chronic back pain.  She uses tramadol as needed for pain.   OAB (overactive bladder) Assessment & Plan: Wears pad at night for incontinence... has urgency during the day, not severe frequency.  Detrol LA 4 mg helps minimally.. some dry mouth. Mirbegron interacts with tamoxifem.  Will  try alternate medication trial.   Other orders -     Solifenacin Succinate; Take 1 tablet (5 mg total) by mouth daily.  Dispense: 30 tablet; Refill: 89     Return for diabetes follow up with POC A1C.   Eliezer Lofts, MD

## 2022-02-19 NOTE — Assessment & Plan Note (Signed)
Wears pad at night for incontinence... has urgency during the day, not severe frequency.  Detrol LA 4 mg helps minimally.. some dry mouth. Mirbegron interacts with tamoxifem.  Will try alternate medication trial.

## 2022-02-19 NOTE — Assessment & Plan Note (Signed)
Followed by cardiology. 

## 2022-02-19 NOTE — Assessment & Plan Note (Signed)
Chronic, treated in past with vertebroplasty.  Causes chronic back pain.  She uses tramadol as needed for pain.

## 2022-02-19 NOTE — Assessment & Plan Note (Signed)
Associated with diabetes.  Well-controlled with current gabapentin regimen.Ashley Ryan

## 2022-02-19 NOTE — Assessment & Plan Note (Signed)
Stable, chronic.  Continue current medication.   Losartan HCTZ 100/25 mg daily

## 2022-02-19 NOTE — Assessment & Plan Note (Signed)
Chronic, in remission She states this is not an issue for her and does not feel she was really on the SSRI for this.

## 2022-02-20 ENCOUNTER — Encounter: Payer: Self-pay | Admitting: Family Medicine

## 2022-02-20 ENCOUNTER — Ambulatory Visit (INDEPENDENT_AMBULATORY_CARE_PROVIDER_SITE_OTHER): Payer: Medicare PPO | Admitting: Family Medicine

## 2022-02-20 VITALS — Ht 59.0 in | Wt 129.1 lb

## 2022-02-20 DIAGNOSIS — M1711 Unilateral primary osteoarthritis, right knee: Secondary | ICD-10-CM

## 2022-02-20 MED ORDER — HYALURONAN 88 MG/4ML IX SOSY
88.0000 mg | PREFILLED_SYRINGE | Freq: Once | INTRA_ARTICULAR | Status: AC
  Administered 2022-02-20: 88 mg via INTRA_ARTICULAR

## 2022-02-20 NOTE — Progress Notes (Signed)
    Essense Bousquet T. Isauro Skelley, MD, [facility] [place], [address]  Phone: [phone]  FAX: [phone]  MANASI DISHON - 77 y.o. female  MRN [id number]  Date of Birth: 1945/12/22  Date: 02/20/2022  PCP: [name], MD  Referral: [name], MD  Chief Complaint  Patient presents with   Knee Pain    Right-Monovisc Injection   Subjective:   Ashley Ryan is a 77 y.o. very pleasant female patient with Body mass index is 26.08 kg/m. who presents with the following:  She is a well-known patient with severe degenerative joint disease of the right knee, she presents today for Monovisc injection.  Aspiration/Injection Procedure Note NYKIAH MA May 13, 1945 Date of procedure: 02/20/2022  Procedure: Large Joint Aspiration / Injection of Knee for Viscosupplementation Medication: Monovisc Indications: Pain  Procedure Details Patient verbally consented to procedure. Risks, benefits, and alternatives explained. Sterilely prepped with Chloraprep. Ethyl cholride used for anesthesia, then 7 cc of Lidocaine 1% used for anesthesia in the anteromedial position. Reprepped with Chloraprep.  Anteromedial approach used to inject joint without difficulty, injected with Monovisc, 4 mL. No complications with procedure and tolerated well.     ICD-10-CM   1. Primary osteoarthritis of right knee  M17.11 Hyaluronan (MONOVISC) intra-articular injection 88 mg      Medication Management during today's office visit: Meds ordered this encounter  Medications   Hyaluronan (MONOVISC) intra-articular injection 88 mg

## 2022-02-26 ENCOUNTER — Other Ambulatory Visit: Payer: Self-pay | Admitting: Family Medicine

## 2022-02-26 NOTE — Telephone Encounter (Signed)
Last office visit 02/20/2022 with Dr. Lorelei Pont for knee injection.  Last refilled 02/02/2021 for #90 with 3 refills.  Next appt:  08/20/2022 for DM.

## 2022-02-28 ENCOUNTER — Encounter: Payer: Self-pay | Admitting: Internal Medicine

## 2022-02-28 ENCOUNTER — Ambulatory Visit: Payer: Medicare PPO | Attending: Internal Medicine | Admitting: Internal Medicine

## 2022-02-28 ENCOUNTER — Encounter: Payer: Self-pay | Admitting: Urgent Care

## 2022-02-28 VITALS — BP 172/84 | HR 85 | Ht 59.0 in | Wt 127.0 lb

## 2022-02-28 DIAGNOSIS — I340 Nonrheumatic mitral (valve) insufficiency: Secondary | ICD-10-CM

## 2022-02-28 DIAGNOSIS — I10 Essential (primary) hypertension: Secondary | ICD-10-CM

## 2022-02-28 DIAGNOSIS — I25118 Atherosclerotic heart disease of native coronary artery with other forms of angina pectoris: Secondary | ICD-10-CM

## 2022-02-28 DIAGNOSIS — E785 Hyperlipidemia, unspecified: Secondary | ICD-10-CM

## 2022-02-28 DIAGNOSIS — I5032 Chronic diastolic (congestive) heart failure: Secondary | ICD-10-CM

## 2022-02-28 MED ORDER — AMLODIPINE BESYLATE 5 MG PO TABS: 5.0000 mg | ORAL_TABLET | Freq: Every day | ORAL | 3 refills | Status: DC

## 2022-02-28 NOTE — Progress Notes (Signed)
Follow-up Outpatient Visit Date: 02/28/2022  Primary Care Provider: [name], MD [place] [address]  Chief Complaint: Elevated blood pressure  HPI:  Ms. Kochel is a 77 y.o. female with history of nonobstructive coronary artery disease, HFpEF, mild pulmonary hypertension (RVSP 35-40% by echo in 10/2017), systemic hypertension, hiatal hernia, prediabetes, anemia, and arthritis, who presents for follow-up of dizziness.  I last saw her in September, at which time she complained of progressive dizziness over 4 months with several falls leading to fractures of her spine and toes.  Dizziness began around the time that she was transitioned from fluoxetine to citalopram.  We agreed to decrease isosorbide mononitrate to 15 mg daily to see if this would help with some of her dizziness.  I also encouraged her to speak with her PCP about de-escalation/discontinuation of citalopram.  Subsequent echocardiogram showed normal LVEF with mild mitral regurgitation; no obvious structural abnormality to explain her dizziness was identified.  Today, Ms. Ronk is concerned about fluctuating blood pressure.  Over the last 1.5-2 weeks, it has been higher than normal, with systolic readings in the 604'V.  She also notes a persistent headache.  She was recently started on a new allergy pill, which hasn't helped her headaches.  She is no longer taking citalopram.  She continues to have intermittent dizziness, though it seems to bother her less.  She has not fallen since our last visit.  She has been under quite a bit of stress recently, caring for her husband who has leukemia.  Chronic exertional dyspnea when walking long distances has been stable for more than a year.  She denies chest pain and palpitations.  Mild dependent leg edema is stable.  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  Mastectomy/ SLN.   Chronic headache    Complication of anesthesia    prior to 1991 used to have PONV.  none recently.   Diverticulosis of colon    Family history of adverse reaction to anesthesia    mother/daighter get sick   GERD with stricture    Hard of hearing    Heart murmur    followed by PCP   Hiatal hernia    Hypertension    Neuropathy    feet   Osteoporosis    Pre-diabetes    Wears dentures    full upper   Past Surgical History:  Procedure Laterality Date   ABDOMINAL HYSTERECTOMY  1978   one ovary remains   BREAST BIOPSY Left 05/26/2017   Affirm Bx- coil clip Ductal carcinoma in situ, high nuclear grade with calcifications and focal comedonecrosis   CARDIAC CATHETERIZATION     "yrs ago" all OK.   CATARACT EXTRACTION W/PHACO Left 02/21/2016   Procedure: CATARACT EXTRACTION PHACO AND INTRAOCULAR LENS PLACEMENT ([place])  Left eye;  Surgeon: Leandrew Koyanagi, MD;  Location: [facility];  Service: Ophthalmology;  Laterality: Left;  Left eye Diabetic   CATARACT EXTRACTION W/PHACO Right 03/13/2016   Procedure: CATARACT EXTRACTION PHACO AND INTRAOCULAR LENS PLACEMENT ([place])  right  diabetic;  Surgeon: Leandrew Koyanagi, MD;  Location: [facility];  Service: Ophthalmology;  Laterality: Right;  diabetic   CHOLECYSTECTOMY  1978   COLONOSCOPY W/ POLYPECTOMY  09/09/2013   8 mm tubular adenoma of the cecum.  Erskine Emery, MD. [facility]   COLONOSCOPY WITH  PROPOFOL N/A 06/04/2019   Procedure: COLONOSCOPY WITH PROPOFOL;  Surgeon: Lucilla Lame, MD;  Location: [facility] ENDOSCOPY;  Service: Endoscopy;  Laterality: N/A;   ESOPHAGOGASTRODUODENOSCOPY (EGD) WITH PROPOFOL N/A 06/04/2019   Procedure: ESOPHAGOGASTRODUODENOSCOPY (EGD) WITH PROPOFOL;  Surgeon: Lucilla Lame, MD;  Location: [facility] ENDOSCOPY;  Service: Endoscopy;  Laterality: N/A;   JOINT REPLACEMENT Left    knee    LEFT HEART  CATH AND CORONARY ANGIOGRAPHY N/A 11/06/2017   Procedure: LEFT HEART CATH AND CORONARY ANGIOGRAPHY;  Surgeon: Nelva Bush, MD;  Location: [place] CV LAB;  Service: Cardiovascular;  Laterality: N/A;   MASTECTOMY Left 06/13/2017   mastectomy neg margins   OOPHORECTOMY     one still left   RENAL ANGIOGRAPHY  11/06/2017   Procedure: RENAL ANGIOGRAPHY;  Surgeon: Nelva Bush, MD;  Location: [place] CV [facility];  Service: Cardiovascular;;   SENTINEL NODE BIOPSY Left 06/13/2017   Procedure: SENTINEL NODE BIOPSY;  Surgeon: Robert Bellow, MD;  Location: [facility] ORS;  Service: General;  Laterality: Left;   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  Surgeon: Robert Bellow, MD;  Location: [facility] ORS;  Service: General;  Laterality: Left;    Current Meds  Medication Sig   Alcohol Swabs (DROPSAFE ALCOHOL PREP) 70 % PADS USE  TO TEST BLOOD SUGAR ONE TIME DAILY   atorvastatin (LIPITOR) 40 MG tablet TAKE 1 TABLET AT BEDTIME   Blood Glucose Calibration (ACCU-CHEK AVIVA) SOLN Use to check blood sugar once daily   Blood Glucose Calibration (TRUE METRIX LEVEL 1) Low SOLN USE AS DIRECTED   Blood Glucose Monitoring Suppl (TRUE METRIX AIR GLUCOSE METER) w/Device KIT Use to check blood sugar daily   brimonidine (ALPHAGAN) 0.2 % ophthalmic solution brimonidine 0.2 % eye drops  INSTILL 1 DROP INTO BOTH EYES TWICE A DAY   Calcium Carb-Cholecalciferol (CALCIUM 600 + D) 600-5 MG-MCG TABS Take 2 tablets by mouth daily.   cetirizine (ZYRTEC) 10 MG tablet Take 10 mg by mouth daily.   diclofenac Sodium (VOLTAREN) 1 % GEL APPLY 2 GRAMS TOPICALLY TO THE AFFECTED AREA 4 TIMES DAILY.   esomeprazole (NEXIUM) 40 MG capsule TAKE 1 CAPSULE 2 (TWO) TIMES DAILY BEFORE A MEAL.   fluticasone (FLONASE) 50 MCG/ACT nasal spray USE 2 SPRAYS INTO THE NOSE DAILY AS DIRECTED (SUBSTITUTED FOR FLONASE)   furosemide (LASIX) 20 MG tablet TAKE 1 TABLET BY  MOUTH EVERY DAY AS NEEDED   gabapentin (NEURONTIN) 100 MG capsule TAKE 1 CAPSULE EVERY MORNING , 1 CAPSULE AT LUNCH, 1 CAPSULE AT DINNER, AND 2-3 CAPSULES AT BEDTIME   Glucosamine 500 MG TABS Take 500 mg by mouth daily.   glucose blood (TRUE METRIX BLOOD GLUCOSE TEST) test strip Use to check blood sugar daily   isosorbide mononitrate (IMDUR) 30 MG 24 hr tablet Take 0.5 tablets (15 mg total) by mouth daily.   KLOR-CON M20 20 MEQ tablet TAKE 1 TABLET BY MOUTH EVERY DAY   latanoprost (XALATAN) 0.005 % ophthalmic solution Place 1 drop into both eyes at bedtime.   lidocaine (LIDODERM) 5 % APPLY 1 PATCH ONTO THE SKIN EVERY DAY. REMOVE AND DISCARD PATCH WITHIN 12 HOURS OR AS DIRECTED BY PHYSICIAN   losartan-hydrochlorothiazide (HYZAAR) 100-25 MG tablet Take 1 tablet by mouth daily.   Omega-3 Fatty Acids (FISH OIL) 1000 MG CAPS Take 1,000 mg by mouth daily.    polycarbophil (FIBERCON) 625 MG tablet Take 625 mg by mouth daily.  solifenacin (VESICARE) 5 MG tablet Take 1 tablet (5 mg total) by mouth daily.   tamoxifen (NOLVADEX) 20 MG tablet TAKE 1 TABLET EVERY DAY   traMADol (ULTRAM) 50 MG tablet TAKE 1 TABLET EVERY 6 HOURS AS NEEDED FOR PAIN   triamcinolone cream (KENALOG) 0.5 % Apply 1 application topically 2 (two) times daily.   TRUEplus Lancets 28G MISC TEST BLOOD SUGAR EVERY DAY    Allergies: Oxycodone, Sulfa antibiotics, and Sulfonamide derivatives  Social History   Tobacco Use   Smoking status: Never   Smokeless tobacco: Never  Vaping Use   Vaping Use: Never used  Substance Use Topics   Alcohol use: No   Drug use: No    Family History  Problem Relation Age of Onset   Breast cancer Other    Stomach cancer Other    Diabetes Other    Heart disease Other    Breast cancer Sister 38   Esophageal cancer Brother    Hypertension Mother    Heart attack Mother 53   Hypertension Father    Coronary artery disease Father 77       CABG   Stroke Father     Review of Systems: A  12-system review of systems was performed and was negative except as noted in the HPI.  --------------------------------------------------------------------------------------------------  Physical Exam: BP (!) 160/90 (BP Location: Left Arm, Patient Position: Sitting, Cuff Size: Normal)   Pulse 85   Ht '4\' 11"'$  (1.499 m)   Wt 127 lb (57.6 kg)   SpO2 97%   BMI 25.65 kg/m  Repeat BP: 172/84  General:  NAD. Neck: No JVD or HJR. Lungs: Clear to auscultation bilaterally without wheezes or crackles. Heart: Regular rate and rhythm with 2/6 systolic murmur. Abdomen: Soft, nontender, nondistended. Extremities: Trace ankle edema.  EKG:  Normal sinus rhythm with possible left atrial enlargement.  No significant change since prior tracing on 10/12/2021.  Lab Results  Component Value Date   WBC 5.6 09/10/2021   HGB 11.0 (L) 09/10/2021   HCT 35.8 (L) 09/10/2021   MCV 85.4 09/10/2021   PLT 189 09/10/2021    Lab Results  Component Value Date   NA 135 09/12/2021   K 3.1 (L) 09/12/2021   CL 101 09/12/2021   CO2 24 09/12/2021   BUN 15 09/12/2021   CREATININE 0.74 09/12/2021   GLUCOSE 120 (H) 09/12/2021   ALT 11 09/10/2021    Lab Results  Component Value Date   CHOL 125 02/12/2022   HDL 59.80 02/12/2022   LDLCALC 44 02/12/2022   LDLDIRECT 102.5 07/21/2009   TRIG 104.0 02/12/2022   CHOLHDL 2 02/12/2022    --------------------------------------------------------------------------------------------------  ASSESSMENT AND PLAN: Coronary artery disease with stable angina: No angina reported.  Continue medical therapy to prevent progression of disease, as well as antianginal therapy with Imdur 30 mg daily (did not split pill as discussed at last visit).  We will also add amlodipine for improved BP control; it will also provide some antianginal therapy.  Chronic HFpEF and mild mitral regurgitation: Ms. Legore appears euvolemic with stable NYHA class II symptoms.  We will add back  amlodipine to improve BP control.  Continue as needed furosemide.  Uncontrolled hypertension: BP has trended up at home over the last couple of weeks.  BP poorly controlled today.  We will add amlodipine 5 mg daily; continue other medications.  Hyperlipidemia; LDL well-controlled on last check in 01/2022.  Continue atorvastatin 40 mg daily.  Follow-up: Return to clinic in 3 months.  Nelva Bush, MD 02/28/2022 10:03 AM

## 2022-02-28 NOTE — Patient Instructions (Signed)
Medication Instructions:  Your physician recommends the following medication changes.  START TAKING: Amlodipine 5 mg daily  *If you need a refill on your cardiac medications before your next appointment, please call your pharmacy*   Lab Work: None ordered today   Testing/Procedures: None ordered today   Follow-Up: At [facility], you and your health needs are our priority.  As part of our continuing mission to provide you with exceptional heart care, we have created designated Provider Care Teams.  These Care Teams include your primary Cardiologist (physician) and Advanced Practice Providers (APPs -  Physician Assistants and Nurse Practitioners) who all work together to provide you with the care you need, when you need it.  We recommend signing up for the patient portal called "MyChart".  Sign up information is provided on this After Visit Summary.  MyChart is used to connect with patients for Virtual Visits (Telemedicine).  Patients are able to view lab/test results, encounter notes, upcoming appointments, etc.  Non-urgent messages can be sent to your provider as well.   To learn more about what you can do with MyChart, go to [email or web address].    Your next appointment:   3 month(s)  Provider:   You may see Nelva Bush, MD or one of the following Advanced Practice Providers on your designated Care Team:   Murray Hodgkins, NP Christell Faith, PA-C Cadence Kathlen Mody, PA-C Gerrie Nordmann, NP

## 2022-03-14 DIAGNOSIS — H401132 Primary open-angle glaucoma, bilateral, moderate stage: Secondary | ICD-10-CM | POA: Diagnosis not present

## 2022-03-15 ENCOUNTER — Inpatient Hospital Stay (HOSPITAL_BASED_OUTPATIENT_CLINIC_OR_DEPARTMENT_OTHER): Payer: Medicare PPO | Admitting: Oncology

## 2022-03-15 ENCOUNTER — Inpatient Hospital Stay: Payer: Medicare PPO | Attending: Oncology

## 2022-03-15 ENCOUNTER — Encounter: Payer: Self-pay | Admitting: Oncology

## 2022-03-15 ENCOUNTER — Other Ambulatory Visit: Payer: Self-pay

## 2022-03-15 ENCOUNTER — Inpatient Hospital Stay: Payer: Medicare PPO

## 2022-03-15 VITALS — BP 124/74 | HR 95 | Temp 97.9°F | Resp 18 | Wt 127.1 lb

## 2022-03-15 DIAGNOSIS — D509 Iron deficiency anemia, unspecified: Secondary | ICD-10-CM

## 2022-03-15 DIAGNOSIS — Z79810 Long term (current) use of selective estrogen receptor modulators (SERMs): Secondary | ICD-10-CM | POA: Diagnosis not present

## 2022-03-15 DIAGNOSIS — I10 Essential (primary) hypertension: Secondary | ICD-10-CM | POA: Diagnosis not present

## 2022-03-15 DIAGNOSIS — E538 Deficiency of other specified B group vitamins: Secondary | ICD-10-CM

## 2022-03-15 DIAGNOSIS — M810 Age-related osteoporosis without current pathological fracture: Secondary | ICD-10-CM | POA: Diagnosis not present

## 2022-03-15 DIAGNOSIS — Z79899 Other long term (current) drug therapy: Secondary | ICD-10-CM | POA: Diagnosis not present

## 2022-03-15 DIAGNOSIS — Z9012 Acquired absence of left breast and nipple: Secondary | ICD-10-CM | POA: Diagnosis not present

## 2022-03-15 DIAGNOSIS — E119 Type 2 diabetes mellitus without complications: Secondary | ICD-10-CM | POA: Insufficient documentation

## 2022-03-15 DIAGNOSIS — D0512 Intraductal carcinoma in situ of left breast: Secondary | ICD-10-CM | POA: Insufficient documentation

## 2022-03-15 LAB — COMPREHENSIVE METABOLIC PANEL
ALT: 11 U/L (ref 0–44)
AST: 20 U/L (ref 15–41)
Albumin: 3.5 g/dL (ref 3.5–5.0)
Alkaline Phosphatase: 43 U/L (ref 38–126)
Anion gap: 11 (ref 5–15)
BUN: 14 mg/dL (ref 8–23)
CO2: 23 mmol/L (ref 22–32)
Calcium: 8.4 mg/dL — ABNORMAL LOW (ref 8.9–10.3)
Chloride: 103 mmol/L (ref 98–111)
Creatinine, Ser: 0.82 mg/dL (ref 0.44–1.00)
GFR, Estimated: >60 mL/min (ref >60)
Glucose, Bld: 98 mg/dL (ref 70–99)
Potassium: 3 mmol/L — ABNORMAL LOW (ref 3.5–5.1)
Sodium: 137 mmol/L (ref 135–145)
Total Bilirubin: 0.5 mg/dL (ref 0.3–1.2)
Total Protein: 6.4 g/dL — ABNORMAL LOW (ref 6.5–8.1)

## 2022-03-15 LAB — CBC WITH DIFFERENTIAL/PLATELET
Abs Immature Granulocytes: 0.02 10*3/uL (ref 0.00–0.07)
Basophils Absolute: 0.1 10*3/uL (ref 0.0–0.1)
Basophils Relative: 1 %
Eosinophils Absolute: 0.2 10*3/uL (ref 0.0–0.5)
Eosinophils Relative: 3 %
HCT: 32.1 % — ABNORMAL LOW (ref 36.0–46.0)
Hemoglobin: 9.8 g/dL — ABNORMAL LOW (ref 12.0–15.0)
Immature Granulocytes: 0 %
Lymphocytes Relative: 31 %
Lymphs Abs: 2 10*3/uL (ref 0.7–4.0)
MCH: 26 pg (ref 26.0–34.0)
MCHC: 30.5 g/dL (ref 30.0–36.0)
MCV: 85.1 fL (ref 80.0–100.0)
Monocytes Absolute: 0.7 10*3/uL (ref 0.1–1.0)
Monocytes Relative: 11 %
Neutro Abs: 3.5 10*3/uL (ref 1.7–7.7)
Neutrophils Relative %: 54 %
Platelets: 176 10*3/uL (ref 150–400)
RBC: 3.77 MIL/uL — ABNORMAL LOW (ref 3.87–5.11)
RDW: 17.3 % — ABNORMAL HIGH (ref 11.5–15.5)
WBC: 6.4 10*3/uL (ref 4.0–10.5)
nRBC: 0 % (ref 0.0–0.2)

## 2022-03-15 LAB — IRON AND TIBC
Iron: 56 ug/dL (ref 28–170)
Saturation Ratios: 16 % (ref 10.4–31.8)
TIBC: 356 ug/dL (ref 250–450)
UIBC: 300 ug/dL

## 2022-03-15 LAB — RETIC PANEL
Immature Retic Fract: 12.6 % (ref 2.3–15.9)
RBC.: 3.77 MIL/uL — ABNORMAL LOW (ref 3.87–5.11)
Retic Count, Absolute: 47.1 10*3/uL (ref 19.0–186.0)
Retic Ct Pct: 1.3 % (ref 0.4–3.1)
Reticulocyte Hemoglobin: 28.1 pg (ref >27.9)

## 2022-03-15 LAB — FERRITIN: Ferritin: 24 ng/mL (ref 11–307)

## 2022-03-15 MED ORDER — IRON-VITAMIN C 65-125 MG PO TABS: 1.0000 | ORAL_TABLET | Freq: Every day | ORAL | 5 refills | Status: AC

## 2022-03-15 NOTE — Progress Notes (Signed)
Hematology/Oncology Progress note [phone] [phone] [phone]    Chief Complaint: Ashley Ryan is a 77 y.o. female presents for follow up of left breast DCIS s/p mastectomy and B12 deficiency.  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  Plan total 5 years, will discontinue at her next visit in Sept 2024  Patient is currently on Celexa.[Switched from Prozac]  B12 deficiency Last B12 level is elevated. Monitor.   Iron deficiency anemia Iron saturation has decreased comparing to previous level  Recommend patient to start oral iron supplementation, Vitron C daily.  Repeat level in 2 months.    Osteoporosis #Osteoporosis. Recommend calcium and vitamin D supplementation.   Zometa 4 mg every 6 months. Hold Zometa today, due to low calcium level, repeat labs in 2 weeks, if calcium improves, she will proceed with Zometa.    Orders Placed This Encounter  Procedures   MM 3D SCREEN BREAST UNI RIGHT    Standing Status:   Future    Standing Expiration Date:   03/16/2023    Order Specific Question:   Reason for Exam (SYMPTOM  OR DIAGNOSIS REQUIRED)    Answer:   Breast cancer    Order Specific Question:   Preferred imaging location?    Answer:   [place] Only    Standing Status:   Future    Standing Expiration Date:   03/16/2023   CBC with Differential ([place] Only)    Standing Status:   Future    Standing Expiration Date:   03/16/2023   CMP ([place] only)    Standing Status:   Future    Standing Expiration Date:   03/16/2023   Iron and TIBC    Standing Status:   Future    Standing Expiration Date:   03/16/2023   Ferritin    Standing Status:   Future    Standing Expiration Date:   03/16/2023   Follow up in 6 months.  All questions were answered. The patient knows to call the clinic with any  problems, questions or concerns.  Earlie Server, MD, PhD [facility] Hematology Oncology 03/15/2022   PERTINENT ONCOLOGY HISTORY Ashley Ryan is a 77 y.o.afemale who has above oncology history reviewed by me today presented for follow up visit for management of  left breast DCIS s/p mastectomy and B12 deficiency.  Patient previously followed up by Dr.Corcoran, patient switched care to me on 09/07/20 Extensive medical record review was performed by me  06/13/2017.  left breast DCIS s/p simple mastectomy on  Pathology revealed at least 8 mm of grade III DCIS.  Margins were negative.  One sentinel lymph node was negative.  DCIS was ER was + (> 90%) and PR + (75%).  Pathologic stage was Tis N0.   07/03/2017. tamoxifen   # Bone health.  Osteopenia 08/04/2019 Bone density revealed osteopenia with a T score of -2.1 in the right femoral neck.    # iron deficiency anemia.  history of blood transfusion  EGD on 09/09/2013 revealed Cameron erosions, esophageal stricture s/p Maloney dilatation, and a large sliding hiatal hernia. EGD on 06/04/2019 revealed a medium-sized hiatal hernia, gastritis, and one duodenal polyp (peptic duodenitis).  Pathology revealed iron pill gastritis and no H pylori, metaplasia, dysplasia or malignancy.  Colonoscopy on 09/09/2013 revealed an 8 mm sessile polyp in the cecum (  tubular adenoma).  Colonoscopy on 06/04/2019 revealed two 6 to 8 mm polyps in the transverse colon (tubular adenomas), and one 6 mm polyp in the rectum (tubular adenoma).  Clip (MR conditional) was placed.  There was one 3 mm polyp in the ascending colon (tubular adenoma).  There were non-bleeding internal hemorrhoids and diverticulosis in the sigmoid colon.   09/07/2021 unilateral right screening mammogram showed possible asymmetry.  Asymmetry did not persist on additional views   INTERVAL HISTORY Ashley Ryan is a 77 y.o. female who has above history reviewed by me today presents for follow up visit for  management of history of DCIS, osteoporosis. Patient reports feeling well.  She has no new concerns.  No breast concerns.    Past Medical History:  Diagnosis Date   Anemia    Arthritis    Breast cancer ([place]) 06/13/2017   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  Mastectomy/ SLN.   Chronic headache    Complication of anesthesia    prior to 1991 used to have PONV.  none recently.   Diverticulosis of colon    Family history of adverse reaction to anesthesia    mother/daighter get sick   GERD with stricture    Hard of hearing    Heart murmur    followed by PCP   Hiatal hernia    Hypertension    Neuropathy    feet   Osteoporosis    Pre-diabetes    Wears dentures    full upper    Past Surgical History:  Procedure Laterality Date   ABDOMINAL HYSTERECTOMY  1978   one ovary remains   BREAST BIOPSY Left 05/26/2017   Affirm Bx- coil clip Ductal carcinoma in situ, high nuclear grade with calcifications and focal comedonecrosis   CARDIAC CATHETERIZATION     "yrs ago" all OK.   CATARACT EXTRACTION W/PHACO Left 02/21/2016   Procedure: CATARACT EXTRACTION PHACO AND INTRAOCULAR LENS PLACEMENT ([place])  Left eye;  Surgeon: Leandrew Koyanagi, MD;  Location: [place];  Service: Ophthalmology;  Laterality: Left;  Left eye Diabetic   CATARACT EXTRACTION W/PHACO Right 03/13/2016   Procedure: CATARACT EXTRACTION PHACO AND INTRAOCULAR LENS PLACEMENT ([place])  right  diabetic;  Surgeon: Leandrew Koyanagi, MD;  Location: [place];  Service: Ophthalmology;  Laterality: Right;  diabetic   CHOLECYSTECTOMY  1978   COLONOSCOPY W/ POLYPECTOMY  09/09/2013   8 mm tubular adenoma of the cecum.  Erskine Emery, MD. [place] [facility]   COLONOSCOPY WITH PROPOFOL N/A 06/04/2019   Procedure: COLONOSCOPY WITH PROPOFOL;  Surgeon: Lucilla Lame, MD;  Location: [facility] ENDOSCOPY;  Service: Endoscopy;  Laterality: N/A;   ESOPHAGOGASTRODUODENOSCOPY (EGD) WITH PROPOFOL N/A 06/04/2019   Procedure:  ESOPHAGOGASTRODUODENOSCOPY (EGD) WITH PROPOFOL;  Surgeon: Lucilla Lame, MD;  [facility]: [facility] ENDOSCOPY;  Service: Endoscopy;  Laterality: N/A;   JOINT REPLACEMENT Left    knee    LEFT HEART CATH AND CORONARY ANGIOGRAPHY N/A 11/06/2017   Procedure: LEFT HEART CATH AND CORONARY ANGIOGRAPHY;  Surgeon: Nelva Bush, MD;  Location: [place];  Service: Cardiovascular;  Laterality: N/A;   MASTECTOMY Left 06/13/2017   mastectomy neg margins   OOPHORECTOMY     one still left   RENAL ANGIOGRAPHY  11/06/2017   Procedure: RENAL ANGIOGRAPHY;  Surgeon: Nelva Bush, MD;  Location: [place] CV LAB;  Service: Cardiovascular;;   SENTINEL NODE BIOPSY Left 06/13/2017   Procedure: SENTINEL NODE BIOPSY;  Surgeon: Robert Bellow, MD;  Location: [facility] ORS;  Service:  General;  Laterality: Left;   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  Surgeon: Robert Bellow, MD;  Location: [facility] ORS;  Service: General;  Laterality: Left;    Family History  Problem Relation Age of Onset   Breast cancer Other    Stomach cancer Other    Diabetes Other    Heart disease Other    Breast cancer Sister 17   Esophageal cancer Brother    Hypertension Mother    Heart attack Mother 57   Hypertension Father    Coronary artery disease Father 73       CABG   Stroke Father     Social History:  reports that she has never smoked. She has never used smokeless tobacco. She reports that she does not drink alcohol and does not use drugs. Her husband's name is Scientist, research (medical).  She is a retired [employer] manager.  She lives in [place]. The patient is alone today.   Allergies:  Allergies  Allergen Reactions   Oxycodone Nausea Only   Sulfa Antibiotics Rash    Childhood reaction   Sulfonamide Derivatives Rash    Childhood reaction.    Current Medications: Current Outpatient Medications  Medication Sig Dispense Refill   Alcohol Swabs (DROPSAFE ALCOHOL  PREP) 70 % PADS USE  TO TEST BLOOD SUGAR ONE TIME DAILY 100 each 3   amLODipine (NORVASC) 5 MG tablet Take 1 tablet (5 mg total) by mouth daily. 90 tablet 3   atorvastatin (LIPITOR) 40 MG tablet TAKE 1 TABLET AT BEDTIME 90 tablet 3   Blood Glucose Calibration (ACCU-CHEK AVIVA) SOLN Use to check blood sugar once daily 1 each 0   Blood Glucose Calibration (TRUE METRIX LEVEL 1) Low SOLN USE AS DIRECTED 1 each 3   Blood Glucose Monitoring Suppl (TRUE METRIX AIR GLUCOSE METER) w/Device KIT Use to check blood sugar daily 1 kit 0   brimonidine (ALPHAGAN) 0.2 % ophthalmic solution brimonidine 0.2 % eye drops  INSTILL 1 DROP INTO BOTH EYES TWICE A DAY     Calcium Carb-Cholecalciferol (CALCIUM 600 + D) 600-5 MG-MCG TABS Take 2 tablets by mouth daily. 180 tablet 2   cetirizine (ZYRTEC) 10 MG tablet Take 10 mg by mouth daily.     diclofenac Sodium (VOLTAREN) 1 % GEL APPLY 2 GRAMS TOPICALLY TO THE AFFECTED AREA 4 TIMES DAILY. 700 g 11   esomeprazole (NEXIUM) 40 MG capsule TAKE 1 CAPSULE 2 (TWO) TIMES DAILY BEFORE A MEAL. 180 capsule 0   fluticasone (FLONASE) 50 MCG/ACT nasal spray USE 2 SPRAYS INTO THE NOSE DAILY AS DIRECTED (SUBSTITUTED FOR FLONASE) 48 g 3   furosemide (LASIX) 20 MG tablet TAKE 1 TABLET BY MOUTH EVERY DAY AS NEEDED 30 tablet 5   gabapentin (NEURONTIN) 100 MG capsule TAKE 1 CAPSULE EVERY MORNING , 1 CAPSULE AT LUNCH, 1 CAPSULE AT DINNER, AND 2-3 CAPSULES AT BEDTIME 540 capsule 5   Glucosamine 500 MG TABS Take 500 mg by mouth daily.     glucose blood (TRUE METRIX BLOOD GLUCOSE TEST) test strip Use to check blood sugar daily 100 each 3   isosorbide mononitrate (IMDUR) 30 MG 24 hr tablet Take 30 mg by mouth daily.     KLOR-CON M20 20 MEQ tablet TAKE 1 TABLET BY MOUTH EVERY DAY 90 tablet 1   latanoprost (XALATAN) 0.005 % ophthalmic solution Place 1 drop into both eyes at bedtime.     lidocaine (LIDODERM) 5 %  APPLY 1 PATCH ONTO THE SKIN EVERY DAY. REMOVE AND DISCARD PATCH WITHIN 12 HOURS OR AS  DIRECTED BY PHYSICIAN 90 patch 3   losartan-hydrochlorothiazide (HYZAAR) 100-25 MG tablet Take 1 tablet by mouth daily.     Omega-3 Fatty Acids (FISH OIL) 1000 MG CAPS Take 1,000 mg by mouth daily.      polycarbophil (FIBERCON) 625 MG tablet Take 625 mg by mouth daily.     solifenacin (VESICARE) 5 MG tablet Take 1 tablet (5 mg total) by mouth daily. 30 tablet 11   tamoxifen (NOLVADEX) 20 MG tablet TAKE 1 TABLET EVERY DAY 90 tablet 0   traMADol (ULTRAM) 50 MG tablet TAKE 1 TABLET EVERY 6 HOURS AS NEEDED FOR PAIN 90 tablet 0   triamcinolone cream (KENALOG) 0.5 % Apply 1 application topically 2 (two) times daily. 30 g 0   TRUEplus Lancets 28G MISC TEST BLOOD SUGAR EVERY DAY 100 each 3   No current facility-administered medications for this visit.   Review of Systems  Constitutional:  Negative for appetite change, chills, fatigue and fever.  HENT:   Negative for hearing loss and voice change.   Eyes:  Negative for eye problems.  Respiratory:  Negative for chest tightness and cough.   Cardiovascular:  Negative for chest pain.  Gastrointestinal:  Negative for abdominal distention, abdominal pain and blood in stool.  Endocrine: Negative for hot flashes.  Genitourinary:  Negative for difficulty urinating and frequency.   Musculoskeletal:  Positive for arthralgias and back pain.  Skin:  Negative for itching and rash.  Neurological:  Negative for extremity weakness.  Hematological:  Negative for adenopathy.  Psychiatric/Behavioral:  Negative for confusion.      Performance status (ECOG): 1  Vital Signs Blood pressure 124/74, pulse 95, temperature 97.9 F (36.6 C), temperature source Tympanic, resp. rate 18, weight 127 lb 1.6 oz (57.7 kg), SpO2 100 %.  Physical Exam Constitutional:      General: She is not in acute distress.    Appearance: She is not diaphoretic.     Comments: Thin, ambulates independantly  HENT:     Head: Normocephalic and atraumatic.     Nose: Nose normal.      Mouth/Throat:     Pharynx: No oropharyngeal exudate.  Eyes:     General: No scleral icterus.    Pupils: Pupils are equal, round, and reactive to light.  Cardiovascular:     Rate and Rhythm: Normal rate.  Pulmonary:     Effort: Pulmonary effort is normal. No respiratory distress.  Chest:     Chest wall: No tenderness.  Abdominal:     General: There is no distension.     Palpations: Abdomen is soft.     Tenderness: There is no abdominal tenderness.  Musculoskeletal:        General: Normal range of motion.     Cervical back: Normal range of motion and neck supple.  Skin:    General: Skin is warm.     Findings: No erythema.  Neurological:     Mental Status: She is alert and oriented to person, place, and time.     Cranial Nerves: No cranial nerve deficit.     Motor: No abnormal muscle tone.  Psychiatric:        Mood and Affect: Mood and affect normal.     Laboratory studies.     Latest Ref Rng & Units 03/15/2022   10:05 AM 09/10/2021    2:12 PM 05/07/2021    4:05 PM  CBC  WBC 4.0 - 10.5 K/uL 6.4  5.6  9.4   Hemoglobin 12.0 - 15.0 g/dL 9.8  11.0  11.3   Hematocrit 36.0 - 46.0 % 32.1  35.8  34.4   Platelets 150 - 400 K/uL 176  189  148.0        Latest Ref Rng & Units 03/15/2022   10:05 AM 09/12/2021   12:52 PM 09/10/2021    2:12 PM  CMP  Glucose 70 - 99 mg/dL 98  120  96   BUN 8 - 23 mg/dL '14  15  17   '$ Creatinine 0.44 - 1.00 mg/dL 0.82  0.74  0.90   Sodium 135 - 145 mmol/L 137  135  134   Potassium 3.5 - 5.1 mmol/L 3.0  3.1  2.7   Chloride 98 - 111 mmol/L 103  101  99   CO2 22 - 32 mmol/L '23  24  27   '$ Calcium 8.9 - 10.3 mg/dL 8.4  8.7  8.7   Total Protein 6.5 - 8.1 g/dL 6.4   6.7   Total Bilirubin 0.3 - 1.2 mg/dL 0.5   0.5   Alkaline Phos 38 - 126 U/L 43   53   AST 15 - 41 U/L 20   18   ALT 0 - 44 U/L 11   11

## 2022-03-15 NOTE — Assessment & Plan Note (Addendum)
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  Plan total 5 years, will discontinue at her next visit in Sept 2024  Patient is currently on Celexa.[Switched from [place]

## 2022-03-15 NOTE — Assessment & Plan Note (Signed)
#  Osteoporosis. Recommend calcium and vitamin D supplementation.   Zometa 4 mg every 6 months. Hold Zometa today, due to low calcium level, repeat labs in 2 weeks, if calcium improves, she will proceed with Zometa.

## 2022-03-15 NOTE — Assessment & Plan Note (Addendum)
Iron saturation has decreased comparing to previous level  Recommend patient to start oral iron supplementation, Vitron C daily.

## 2022-03-15 NOTE — Assessment & Plan Note (Signed)
Last B12 level is elevated. Monitor.

## 2022-03-18 ENCOUNTER — Other Ambulatory Visit: Payer: Self-pay | Admitting: Family Medicine

## 2022-03-18 DIAGNOSIS — Z961 Presence of intraocular lens: Secondary | ICD-10-CM | POA: Diagnosis not present

## 2022-03-18 DIAGNOSIS — H18513 Endothelial corneal dystrophy, bilateral: Secondary | ICD-10-CM | POA: Diagnosis not present

## 2022-03-18 DIAGNOSIS — H401133 Primary open-angle glaucoma, bilateral, severe stage: Secondary | ICD-10-CM | POA: Diagnosis not present

## 2022-03-29 ENCOUNTER — Inpatient Hospital Stay: Payer: Medicare PPO

## 2022-03-29 ENCOUNTER — Other Ambulatory Visit: Payer: Self-pay | Admitting: Oncology

## 2022-03-29 ENCOUNTER — Inpatient Hospital Stay: Payer: Medicare PPO | Attending: Oncology

## 2022-03-29 VITALS — BP 135/69 | HR 86 | Temp 96.1°F | Resp 18

## 2022-03-29 DIAGNOSIS — D509 Iron deficiency anemia, unspecified: Secondary | ICD-10-CM | POA: Insufficient documentation

## 2022-03-29 DIAGNOSIS — E538 Deficiency of other specified B group vitamins: Secondary | ICD-10-CM | POA: Diagnosis not present

## 2022-03-29 DIAGNOSIS — M810 Age-related osteoporosis without current pathological fracture: Secondary | ICD-10-CM | POA: Diagnosis not present

## 2022-03-29 DIAGNOSIS — Z79810 Long term (current) use of selective estrogen receptor modulators (SERMs): Secondary | ICD-10-CM | POA: Diagnosis not present

## 2022-03-29 DIAGNOSIS — D0512 Intraductal carcinoma in situ of left breast: Secondary | ICD-10-CM | POA: Diagnosis not present

## 2022-03-29 LAB — BASIC METABOLIC PANEL - CANCER CENTER ONLY
Anion gap: 9 (ref 5–15)
BUN: 14 mg/dL (ref 8–23)
CO2: 25 mmol/L (ref 22–32)
Calcium: 8.8 mg/dL — ABNORMAL LOW (ref 8.9–10.3)
Chloride: 104 mmol/L (ref 98–111)
Creatinine: 0.8 mg/dL (ref 0.44–1.00)
GFR, Estimated: >60 mL/min (ref >60)
Glucose, Bld: 114 mg/dL — ABNORMAL HIGH (ref 70–99)
Potassium: 3.1 mmol/L — ABNORMAL LOW (ref 3.5–5.1)
Sodium: 138 mmol/L (ref 135–145)

## 2022-03-29 LAB — VITAMIN B12: Vitamin B-12: 484 pg/mL (ref 180–914)

## 2022-03-29 MED ORDER — SODIUM CHLORIDE 0.9 % IV SOLN
Freq: Once | INTRAVENOUS | Status: AC
  Filled 2022-03-29: qty 250

## 2022-03-29 MED ORDER — ZOLEDRONIC ACID 4 MG/100ML IV SOLN
4.0000 mg | Freq: Once | INTRAVENOUS | Status: AC
  Administered 2022-03-29: 4 mg via INTRAVENOUS
  Filled 2022-03-29: qty 100

## 2022-03-29 NOTE — Patient Instructions (Signed)

## 2022-03-30 ENCOUNTER — Other Ambulatory Visit: Payer: Self-pay | Admitting: Family Medicine

## 2022-04-10 ENCOUNTER — Other Ambulatory Visit: Payer: Self-pay | Admitting: Family Medicine

## 2022-04-25 ENCOUNTER — Other Ambulatory Visit: Payer: Self-pay | Admitting: Family Medicine

## 2022-05-30 ENCOUNTER — Encounter: Payer: Self-pay | Admitting: Internal Medicine

## 2022-05-30 ENCOUNTER — Ambulatory Visit: Payer: Medicare PPO | Attending: Internal Medicine | Admitting: Internal Medicine

## 2022-05-30 ENCOUNTER — Other Ambulatory Visit
Admission: RE | Admit: 2022-05-30 | Discharge: 2022-05-30 | Disposition: A | Discharge status: Home / Self Care | Payer: Medicare PPO | Source: Ambulatory Visit | Attending: Internal Medicine | Admitting: Internal Medicine

## 2022-05-30 VITALS — BP 138/80 | HR 78 | Ht 59.0 in | Wt 128.4 lb

## 2022-05-30 DIAGNOSIS — E876 Hypokalemia: Secondary | ICD-10-CM | POA: Diagnosis not present

## 2022-05-30 DIAGNOSIS — I25118 Atherosclerotic heart disease of native coronary artery with other forms of angina pectoris: Secondary | ICD-10-CM | POA: Diagnosis not present

## 2022-05-30 DIAGNOSIS — R0989 Other specified symptoms and signs involving the circulatory and respiratory systems: Secondary | ICD-10-CM | POA: Diagnosis not present

## 2022-05-30 DIAGNOSIS — I340 Nonrheumatic mitral (valve) insufficiency: Secondary | ICD-10-CM | POA: Diagnosis not present

## 2022-05-30 DIAGNOSIS — I5032 Chronic diastolic (congestive) heart failure: Secondary | ICD-10-CM

## 2022-05-30 DIAGNOSIS — E785 Hyperlipidemia, unspecified: Secondary | ICD-10-CM | POA: Diagnosis not present

## 2022-05-30 LAB — BASIC METABOLIC PANEL
Anion gap: 8 (ref 5–15)
BUN: 10 mg/dL (ref 8–23)
CO2: 25 mmol/L (ref 22–32)
Calcium: 8.5 mg/dL — ABNORMAL LOW (ref 8.9–10.3)
Chloride: 106 mmol/L (ref 98–111)
Creatinine, Ser: 0.55 mg/dL (ref 0.44–1.00)
GFR, Estimated: >60 mL/min (ref >60)
Glucose, Bld: 94 mg/dL (ref 70–99)
Potassium: 3.2 mmol/L — ABNORMAL LOW (ref 3.5–5.1)
Sodium: 139 mmol/L (ref 135–145)

## 2022-05-30 NOTE — Patient Instructions (Signed)
Medication Instructions:  Your Physician recommend you continue on your current medication as directed.    *If you need a refill on your cardiac medications before your next appointment, please call your pharmacy*   Lab Work: Your provider would like for you to have following labs drawn: (BMP).   Please go to the [facility] entrance and check in at the front desk.  You do not need an appointment.  They are open from 7am-6 pm.   If you have labs (blood work) drawn today and your tests are completely normal, you will receive your results only by: MyChart Message (if you have MyChart) OR A paper copy in the mail If you have any lab test that is abnormal or we need to change your treatment, we will call you to review the results.   Testing/Procedures: None ordered today   Follow-Up: At [facility], you and your health needs are our priority.  As part of our continuing mission to provide you with exceptional heart care, we have created designated Provider Care Teams.  These Care Teams include your primary Cardiologist (physician) and Advanced Practice Providers (APPs -  Physician Assistants and Nurse Practitioners) who all work together to provide you with the care you need, when you need it.  We recommend signing up for the patient portal called "MyChart".  Sign up information is provided on this After Visit Summary.  MyChart is used to connect with patients for Virtual Visits (Telemedicine).  Patients are able to view lab/test results, encounter notes, upcoming appointments, etc.  Non-urgent messages can be sent to your provider as well.   To learn more about what you can do with MyChart, go to [email or web address].    Your next appointment:   6 month(s)  Provider:   You may see Yvonne Kendall, MD or one of the following Advanced Practice Providers on your designated Care Team:   Nicolasa Ducking, NP Eula Listen, PA-C Cadence Fransico Michael, PA-C Charlsie Quest, NP

## 2022-05-30 NOTE — Progress Notes (Signed)
Follow-up Outpatient Visit Date: 05/30/2022  Primary Care Provider: [name], MD [address] [place] [address]  Chief Complaint: HFpEF and pulmonary hypertension  HPI:  Ashley Ryan is a 77 y.o. female with history of HFpEF, mild pulmonary hypertension, systemic hypertension, hiatal hernia, prediabetes, anemia, and arthritis, who presents for follow-up of elevated blood pressure.  I last saw her in February, at which time she was concerned about fluctuating blood pressures and headaches.  She had recently started her allergy revocation that had not helped her headaches.  She continued to note some dizziness though it was less pronounced than at prior visits.  She was under quite a bit of stress, caring for her husband with leukemia.  Chronic exertional dyspnea walking extended distances was unchanged for over a year.  Due to elevated blood pressure, we agreed to restart amlodipine 5 mg daily.  Today, [name]'s main complaint is arthritis in her hands.  She uses tramadol, acetaminophen, and lidocaine for relief.  She also notes occasional episodes of transient generalized weakness that she associates with blood pressure drops.  She reports an episode of this yesterday with a blood pressure of "50/60."  She reports that episodes usually last about 2 hours and then spontaneously resolved.  Episodes happen about once a month without obvious precipitants.  She has not had any chest pain, shortness of breath, palpitations, or frank lightheadedness.  She fell last week while taking out the trash.  This was caused by loss of balance and not getting dizzy.  She has only used her as needed furosemide a few times over the last month.  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  Mastectomy/  SLN.   Chronic headache    Complication of anesthesia    prior to 1991 used to have PONV.  none recently.   Diverticulosis of colon    Family history of adverse reaction to anesthesia    mother/daighter get sick   GERD with stricture    Hard of hearing    Heart murmur    followed by PCP   Hiatal hernia    Hypertension    Neuropathy    feet   Osteoporosis    Pre-diabetes    Wears dentures    full upper   Past Surgical History:  Procedure Laterality Date   ABDOMINAL HYSTERECTOMY  1978   one ovary remains   BREAST BIOPSY Left 05/26/2017   Affirm Bx- coil clip Ductal carcinoma in situ, high nuclear grade with calcifications and focal comedonecrosis   CARDIAC CATHETERIZATION     "yrs ago" all OK.   CATARACT EXTRACTION W/PHACO Left 02/21/2016   Procedure: CATARACT EXTRACTION PHACO AND INTRAOCULAR LENS PLACEMENT (IOC)  Left eye;  Surgeon: Lockie Mola, MD;  Location: [facility];  Service: Ophthalmology;  Laterality: Left;  Left eye Diabetic   CATARACT EXTRACTION W/PHACO Right 03/13/2016   Procedure: CATARACT EXTRACTION PHACO AND INTRAOCULAR LENS PLACEMENT (IOC)  right  diabetic;  Surgeon: Lockie Mola, MD;  Location: [facility];  Service: Ophthalmology;  Laterality: Right;  diabetic   CHOLECYSTECTOMY  1978   COLONOSCOPY W/ POLYPECTOMY  09/09/2013   8 mm tubular adenoma of the cecum.  Melvia Heaps, MD. [place] [facility]   COLONOSCOPY WITH PROPOFOL N/A 06/04/2019   Procedure: COLONOSCOPY WITH PROPOFOL;  Surgeon: Midge Minium, MD;  Location: [facility] ENDOSCOPY;  Service: Endoscopy;  Laterality: N/A;   ESOPHAGOGASTRODUODENOSCOPY (EGD) WITH PROPOFOL N/A 06/04/2019   Procedure: ESOPHAGOGASTRODUODENOSCOPY (EGD) WITH PROPOFOL;  Surgeon: Midge Minium, MD;  Location: [facility] ENDOSCOPY;  Service: Endoscopy;  Laterality: N/A;   JOINT REPLACEMENT Left    knee    LEFT HEART CATH AND CORONARY ANGIOGRAPHY N/A 11/06/2017   Procedure: LEFT HEART CATH AND CORONARY ANGIOGRAPHY;   Surgeon: Yvonne Kendall, MD;  Location: [facility] INVASIVE CV LAB;  Service: Cardiovascular;  Laterality: N/A;   MASTECTOMY Left 06/13/2017   mastectomy neg margins   OOPHORECTOMY     one still left   RENAL ANGIOGRAPHY  11/06/2017   Procedure: RENAL ANGIOGRAPHY;  Surgeon: Yvonne Kendall, MD;  Location: [facility] CV LAB;  Service: Cardiovascular;;   SENTINEL NODE BIOPSY Left 06/13/2017   Procedure: SENTINEL NODE BIOPSY;  Surgeon: Earline Mayotte, MD;  Location: [facility] ORS;  Service: General;  Laterality: Left;   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  Surgeon: Earline Mayotte, MD;  Location: [facility] ORS;  Service: General;  Laterality: Left;    Current Meds  Medication Sig   Alcohol Swabs (DROPSAFE ALCOHOL PREP) 70 % PADS USE  TO TEST BLOOD SUGAR ONE TIME DAILY   amLODipine (NORVASC) 5 MG tablet Take 1 tablet (5 mg total) by mouth daily.   atorvastatin (LIPITOR) 40 MG tablet TAKE 1 TABLET AT BEDTIME   Blood Glucose Calibration (ACCU-CHEK AVIVA) SOLN Use to check blood sugar once daily   Blood Glucose Calibration (TRUE METRIX LEVEL 1) Low SOLN USE AS DIRECTED   Blood Glucose Monitoring Suppl (TRUE METRIX AIR GLUCOSE METER) w/Device KIT Use to check blood sugar daily   brimonidine (ALPHAGAN) 0.2 % ophthalmic solution brimonidine 0.2 % eye drops  INSTILL 1 DROP INTO BOTH EYES TWICE A DAY   Calcium Carb-Cholecalciferol (CALCIUM 600 + D) 600-5 MG-MCG TABS Take 2 tablets by mouth daily.   cetirizine (ZYRTEC) 10 MG tablet Take 10 mg by mouth daily.   diclofenac Sodium (VOLTAREN) 1 % GEL APPLY 2 GRAMS TOPICALLY TO THE AFFECTED AREA 4 TIMES DAILY.   esomeprazole (NEXIUM) 40 MG capsule TAKE 1 CAPSULE TWICE DAILY BEFORE MEALS   fluticasone (FLONASE) 50 MCG/ACT nasal spray USE 2 SPRAYS INTO THE NOSE DAILY AS DIRECTED (SUBSTITUTED FOR FLONASE)   furosemide (LASIX) 20 MG tablet TAKE 1 TABLET BY MOUTH EVERY DAY AS NEEDED    gabapentin (NEURONTIN) 100 MG capsule TAKE 1 CAPSULE EVERY MORNING , 1 CAPSULE AT LUNCH, 1 CAPSULE AT DINNER, AND 2-3 CAPSULES AT BEDTIME   Glucosamine 500 MG TABS Take 500 mg by mouth daily.   glucose blood (TRUE METRIX BLOOD GLUCOSE TEST) test strip Use to check blood sugar daily   Iron-Vitamin C 65-125 MG TABS Take 1 tablet by mouth daily at 2 PM.   isosorbide mononitrate (IMDUR) 30 MG 24 hr tablet TAKE 1 TABLET EVERY DAY   KLOR-CON M20 20 MEQ tablet TAKE 1 TABLET BY MOUTH EVERY DAY   latanoprost (XALATAN) 0.005 % ophthalmic solution Place 1 drop into both eyes at bedtime.   lidocaine (LIDODERM) 5 % APPLY 1 PATCH ONTO THE SKIN EVERY DAY. REMOVE AND DISCARD PATCH WITHIN 12 HOURS OR AS DIRECTED BY PHYSICIAN   losartan-hydrochlorothiazide (HYZAAR) 100-25 MG tablet Take 1 tablet by mouth daily.   Omega-3 Fatty Acids (FISH OIL) 1000 MG CAPS Take 1,000 mg by mouth daily.    polycarbophil (FIBERCON) 625  MG tablet Take 625 mg by mouth daily.   solifenacin (VESICARE) 5 MG tablet Take 1 tablet (5 mg total) by mouth daily.   tamoxifen (NOLVADEX) 20 MG tablet TAKE 1 TABLET EVERY DAY   traMADol (ULTRAM) 50 MG tablet TAKE 1 TABLET EVERY 6 HOURS AS NEEDED FOR PAIN   triamcinolone cream (KENALOG) 0.5 % Apply 1 application topically 2 (two) times daily.   TRUEplus Lancets 28G MISC TEST BLOOD SUGAR EVERY DAY    Allergies: Oxycodone, Sulfa antibiotics, and Sulfonamide derivatives  Social History   Tobacco Use   Smoking status: Never   Smokeless tobacco: Never  Vaping Use   Vaping Use: Never used  Substance Use Topics   Alcohol use: No   Drug use: No    Family History  Problem Relation Age of Onset   Breast cancer Other    Stomach cancer Other    Diabetes Other    Heart disease Other    Breast cancer Sister 40   Esophageal cancer Brother    Hypertension Mother    Heart attack Mother 27   Hypertension Father    Coronary artery disease Father 61       CABG   Stroke Father     Review of  Systems: A 12-system review of systems was performed and was negative except as noted in the HPI.  --------------------------------------------------------------------------------------------------  Physical Exam: BP 138/80   Pulse 78   Ht 4\' 11"  (1.499 m)   Wt 128 lb 6 oz (58.2 kg)   SpO2 97%   BMI 25.93 kg/m   General:  NAD. Neck: No JVD or HJR. Lungs: Clear to auscultation bilaterally without wheezes or crackles. Heart: Regular rate and rhythm with 2/6 systolic murmur. Abdomen: Soft, nontender, nondistended. Extremities: No lower extremity edema.  Lab Results  Component Value Date   WBC 6.4 03/15/2022   HGB 9.8 (L) 03/15/2022   HCT 32.1 (L) 03/15/2022   MCV 85.1 03/15/2022   PLT 176 03/15/2022    Lab Results  Component Value Date   NA 138 03/29/2022   K 3.1 (L) 03/29/2022   CL 104 03/29/2022   CO2 25 03/29/2022   BUN 14 03/29/2022   CREATININE 0.80 03/29/2022   GLUCOSE 114 (H) 03/29/2022   ALT 11 03/15/2022    Lab Results  Component Value Date   CHOL 125 02/12/2022   HDL 59.80 02/12/2022   LDLCALC 44 02/12/2022   LDLDIRECT 102.5 07/21/2009   TRIG 104.0 02/12/2022   CHOLHDL 2 02/12/2022    --------------------------------------------------------------------------------------------------  ASSESSMENT AND PLAN: Coronary artery disease with stable angina: Ms. Ryon has not had any further chest pain or dyspnea.  We have agreed to continue her current medications including antianginal therapy with isosorbide mononitrate and amlodipine.  Chronic HFpEF and mild mitral regurgitation Digaetano appears euvolemic and primarily has NYHA class II symptoms though these sporadic episodes of marked fatigue and reported hypotension are concerning.  Her reported blood pressure of "50/60" is not physiologic and is most likely due to measurement error.  I have asked her to take several blood pressure measurements in the setting of recurrent symptoms and to let us know the next  time she has an episode.  We will defer medication changes at this time.  Labile hypertension: Blood pressure borderline elevated today.  However, in the setting of her reported hypotensive episodes I am reluctant to escalate her blood pressure regimen at this time.  Hyperlipidemia: LDL well-controlled on last check.  Continue atorvastatin 40 mg  daily.  Hypokalemia: Check BMP today given history of persistent hypokalemia.  Follow-up: Return to clinic in 6 months.  Yvonne Kendall, MD 05/30/2022 9:46 AM

## 2022-05-31 ENCOUNTER — Encounter: Payer: Self-pay | Admitting: Internal Medicine

## 2022-06-04 ENCOUNTER — Other Ambulatory Visit: Payer: Self-pay

## 2022-06-04 DIAGNOSIS — Z79899 Other long term (current) drug therapy: Secondary | ICD-10-CM

## 2022-06-04 MED ORDER — POTASSIUM CHLORIDE CRYS ER 20 MEQ PO TBCR: 20.0000 meq | EXTENDED_RELEASE_TABLET | Freq: Two times a day (BID) | ORAL | 3 refills | Status: DC

## 2022-06-05 NOTE — Progress Notes (Signed)
Ashley Picone T. [name], MD, CAQ Sports Medicine [name] [address] [place], [address]  Phone: [phone]  FAX: [phone]  Ashley Ryan - 77 y.o. female  MRN [id number]  Date of Birth: 11-11-1945  Date: 06/06/2022  PCP: [name], MD  Referral: [name], MD  Chief Complaint  Patient presents with   Knee Pain    Right   Foot Pain    Right   Fall   Subjective:   Ashley Ryan is a 77 y.o. very pleasant female patient with Body mass index is 25.15 kg/m. who presents with the following:  Patient presents with ongoing right-sided knee pain and right-sided knee osteoarthritis.  She additionally has had a fall.  Was getting some trash up to the can, and fell over some trash bags.  Put the knees on her trash can, and it was really painful.  Did not hurt that bad at the beginning.   Fall about a month ago - still having some lingering pain.   Using some patches and cream, but knee hurts all the time.   Forefoot will hurt.  This is mainly in the region of the transverse arch.  No significant pain in the midfoot, hindfoot, or ankle.  There is some minimal swelling and no significant bruising.  Custom craft replacement insoles  Inject R knee  Review of Systems is noted in the HPI, as appropriate  Objective:   BP 110/68 (BP Location: Right Arm, Patient Position: Sitting, Cuff Size: Normal)   Pulse 82   Temp 98.3 F (36.8 C) (Temporal)   Ht 4\' 11"  (1.499 m)   Wt 124 lb 8 oz (56.5 kg)   SpO2 96%   BMI 25.15 kg/m   GEN: No acute distress; alert,appropriate. PULM: Breathing comfortably in no respiratory distress PSYCH: Normally interactive.   Right knee: She lacks 3 degrees of extension.  Flexion to 102.  Minimal movement at the patella and pain with loading the medial patellar facet.  Stable to varus and valgus stress.  ACL and PCL are intact. She has very significant medial and lateral joint line  tenderness. Any kind of forced flexion causes pain.  Right foot: She has tenderness in the forefoot near the metatarsal heads, without any significant bruising.  She is able to walk comfortably.  No significant pain in the midfoot, hindfoot, or ankle.  Ankle is stable.  Nontender at all phalanges and MTP joints.  Laboratory and Imaging Data:  Assessment and Plan:     ICD-10-CM   1. Primary osteoarthritis of right knee  M17.11 triamcinolone acetonide (KENALOG-40) injection 40 mg    2. Metatarsalgia of right foot  M77.41     3. Fall, initial encounter  W19.XXXA      Total encounter time: 30 minutes. This includes total time spent on the day of encounter.  I spent additional time to custom craft and grind some insoles over-the-counter for the patient.  I also placed a metatarsal bar on right forefoot.  She size is quite small so, so I had to custom alter some insoles for the patient's foot.  Acute on chronic right-sided knee osteoarthritis with exacerbation.  Known advanced OA.  She also has some pain in the forefoot after her fall.  Minimal to no swelling and no bruising.  Do not suspect fracture.  I am going to place the patient on some diclofenac, and I would expect this to improve slowly with more  time.  Aspiration/Injection Procedure Note CALIXTA STAEBLER Dec 13, 1945 Date of procedure: 06/06/2022  Procedure: Large Joint Aspiration / Injection of Knee, R Indications: Pain  Procedure Details Patient verbally consented to procedure. Risks, benefits, and alternatives explained. Sterilely prepped with Chloraprep. Ethyl cholride used for anesthesia. 9 cc Lidocaine 1% mixed with 1 mL of Kenalog 40 mg injected using the anteromedial approach without difficulty. No complications with procedure and tolerated well. Patient had decreased pain post-injection. Medication: 1 mL of Kenalog 40 mg   Medication Management during today's office visit: Meds ordered this encounter  Medications    diclofenac (VOLTAREN) 75 MG EC tablet    Sig: Take 1 tablet (75 mg total) by mouth 2 (two) times daily.    Dispense:  60 tablet    Refill:  0   triamcinolone acetonide (KENALOG-40) injection 40 mg   There are no discontinued medications.  Orders placed today for conditions managed today: No orders of the defined types were placed in this encounter.   Disposition: No follow-ups on file.  Dr[employer] One speech-to-text software was used for transcription in this dictation.  Possible transcriptional errors can occur using Animal nutritionist.   Signed,  Elpidio Galea. Raena Pau, MD   Outpatient Encounter Medications as of 06/06/2022  Medication Sig   Alcohol Swabs (DROPSAFE ALCOHOL PREP) 70 % PADS USE  TO TEST BLOOD SUGAR ONE TIME DAILY   amLODipine (NORVASC) 5 MG tablet Take 1 tablet (5 mg total) by mouth daily.   atorvastatin (LIPITOR) 40 MG tablet TAKE 1 TABLET AT BEDTIME   Blood Glucose Calibration (ACCU-CHEK AVIVA) SOLN Use to check blood sugar once daily   Blood Glucose Calibration (TRUE METRIX LEVEL 1) Low SOLN USE AS DIRECTED   Blood Glucose Monitoring Suppl (TRUE METRIX AIR GLUCOSE METER) w/Device KIT Use to check blood sugar daily   brimonidine (ALPHAGAN) 0.2 % ophthalmic solution brimonidine 0.2 % eye drops  INSTILL 1 DROP INTO BOTH EYES TWICE A DAY   Calcium Carb-Cholecalciferol (CALCIUM 600 + D) 600-5 MG-MCG TABS Take 2 tablets by mouth daily.   cetirizine (ZYRTEC) 10 MG tablet Take 10 mg by mouth daily.   diclofenac (VOLTAREN) 75 MG EC tablet Take 1 tablet (75 mg total) by mouth 2 (two) times daily.   diclofenac Sodium (VOLTAREN) 1 % GEL APPLY 2 GRAMS TOPICALLY TO THE AFFECTED AREA 4 TIMES DAILY.   esomeprazole (NEXIUM) 40 MG capsule TAKE 1 CAPSULE TWICE DAILY BEFORE MEALS   fluticasone (FLONASE) 50 MCG/ACT nasal spray USE 2 SPRAYS INTO THE NOSE DAILY AS DIRECTED (SUBSTITUTED FOR FLONASE)   furosemide (LASIX) 20 MG tablet TAKE 1 TABLET BY MOUTH EVERY DAY AS NEEDED    gabapentin (NEURONTIN) 100 MG capsule TAKE 1 CAPSULE EVERY MORNING , 1 CAPSULE AT LUNCH, 1 CAPSULE AT DINNER, AND 2-3 CAPSULES AT BEDTIME   Glucosamine 500 MG TABS Take 500 mg by mouth daily.   glucose blood (TRUE METRIX BLOOD GLUCOSE TEST) test strip Use to check blood sugar daily   Iron-Vitamin C 65-125 MG TABS Take 1 tablet by mouth daily at 2 PM.   isosorbide mononitrate (IMDUR) 30 MG 24 hr tablet TAKE 1 TABLET EVERY DAY   latanoprost (XALATAN) 0.005 % ophthalmic solution Place 1 drop into both eyes at bedtime.   lidocaine (LIDODERM) 5 % APPLY 1 PATCH ONTO THE SKIN EVERY DAY. REMOVE AND DISCARD PATCH WITHIN 12 HOURS OR AS DIRECTED BY PHYSICIAN   losartan-hydrochlorothiazide (HYZAAR) 100-25 MG tablet Take 1 tablet by mouth daily.  Omega-3 Fatty Acids (FISH OIL) 1000 MG CAPS Take 1,000 mg by mouth daily.    polycarbophil (FIBERCON) 625 MG tablet Take 625 mg by mouth daily.   potassium chloride SA (KLOR-CON M20) 20 MEQ tablet Take 1 tablet (20 mEq total) by mouth 2 (two) times daily.   solifenacin (VESICARE) 5 MG tablet Take 1 tablet (5 mg total) by mouth daily.   tamoxifen (NOLVADEX) 20 MG tablet TAKE 1 TABLET EVERY DAY   traMADol (ULTRAM) 50 MG tablet TAKE 1 TABLET EVERY 6 HOURS AS NEEDED FOR PAIN   triamcinolone cream (KENALOG) 0.5 % Apply 1 application topically 2 (two) times daily.   TRUEplus Lancets 28G MISC TEST BLOOD SUGAR EVERY DAY   [EXPIRED] triamcinolone acetonide (KENALOG-40) injection 40 mg    No facility-administered encounter medications on file as of 06/06/2022.

## 2022-06-06 ENCOUNTER — Ambulatory Visit (INDEPENDENT_AMBULATORY_CARE_PROVIDER_SITE_OTHER): Payer: Medicare PPO | Admitting: Family Medicine

## 2022-06-06 ENCOUNTER — Encounter: Payer: Self-pay | Admitting: Family Medicine

## 2022-06-06 VITALS — BP 110/68 | HR 82 | Temp 98.3°F | Ht 59.0 in | Wt 124.5 lb

## 2022-06-06 DIAGNOSIS — W19XXXA Unspecified fall, initial encounter: Secondary | ICD-10-CM | POA: Diagnosis not present

## 2022-06-06 DIAGNOSIS — M1711 Unilateral primary osteoarthritis, right knee: Secondary | ICD-10-CM | POA: Diagnosis not present

## 2022-06-06 DIAGNOSIS — M7741 Metatarsalgia, right foot: Secondary | ICD-10-CM

## 2022-06-06 MED ORDER — TRIAMCINOLONE ACETONIDE 40 MG/ML IJ SUSP
40.0000 mg | Freq: Once | INTRAMUSCULAR | Status: AC
Start: 2022-06-06 — End: 2022-06-06
  Administered 2022-06-06: 40 mg via INTRA_ARTICULAR

## 2022-06-06 MED ORDER — DICLOFENAC SODIUM 75 MG PO TBEC
75.0000 mg | DELAYED_RELEASE_TABLET | Freq: Two times a day (BID) | ORAL | 0 refills | Status: DC
Start: 2022-06-06 — End: 2022-08-19

## 2022-06-12 ENCOUNTER — Other Ambulatory Visit: Payer: Self-pay | Admitting: Oncology

## 2022-06-18 ENCOUNTER — Other Ambulatory Visit
Admission: RE | Admit: 2022-06-18 | Discharge: 2022-06-18 | Disposition: A | Discharge status: Home / Self Care | Payer: Medicare PPO | Source: Ambulatory Visit | Attending: Internal Medicine | Admitting: Internal Medicine

## 2022-06-18 DIAGNOSIS — Z79899 Other long term (current) drug therapy: Secondary | ICD-10-CM | POA: Insufficient documentation

## 2022-06-18 LAB — BASIC METABOLIC PANEL
Anion gap: 7 (ref 5–15)
BUN: 17 mg/dL (ref 8–23)
CO2: 22 mmol/L (ref 22–32)
Calcium: 8.9 mg/dL (ref 8.9–10.3)
Chloride: 110 mmol/L (ref 98–111)
Creatinine, Ser: 0.97 mg/dL (ref 0.44–1.00)
GFR, Estimated: 59 mL/min — ABNORMAL LOW (ref >60)
Glucose, Bld: 99 mg/dL (ref 70–99)
Potassium: 4.3 mmol/L (ref 3.5–5.1)
Sodium: 139 mmol/L (ref 135–145)

## 2022-06-19 ENCOUNTER — Telehealth: Payer: Self-pay | Admitting: Pharmacist

## 2022-06-19 DIAGNOSIS — E1140 Type 2 diabetes mellitus with diabetic neuropathy, unspecified: Secondary | ICD-10-CM

## 2022-06-19 DIAGNOSIS — I2089 Other forms of angina pectoris: Secondary | ICD-10-CM

## 2022-06-19 DIAGNOSIS — I10 Essential (primary) hypertension: Secondary | ICD-10-CM

## 2022-06-19 DIAGNOSIS — I5032 Chronic diastolic (congestive) heart failure: Secondary | ICD-10-CM

## 2022-06-19 NOTE — Telephone Encounter (Signed)
PharmD reviewed patient chart to assess eligibility for [facility] Care Management and Coordination services. Patient was determined to be a good candidate for the program given the complexity of the medication regimen and/or overall risk for hospitalization and increased utilization.  Referral entered in order to outreach patient and offer appointment with PharmD. Referral cosigned to PCP.  

## 2022-06-24 ENCOUNTER — Telehealth: Payer: Self-pay | Admitting: Internal Medicine

## 2022-06-24 NOTE — Telephone Encounter (Signed)
Patient was returning call. Are you available to speak to her

## 2022-06-24 NOTE — Telephone Encounter (Signed)
Left message to call back  

## 2022-06-25 NOTE — Telephone Encounter (Signed)
Left voicemail message to call back  

## 2022-06-27 NOTE — Telephone Encounter (Signed)
The patient has been notified of the result along with recommendations. Pt verbalized understanding. All questions (if any) were answered        Yvonne Kendall, MD 06/18/2022 10:10 PM EDT     Please let Ms. Emslie know that her potassium has improved and is now in the normal range.  Kidney function stable.  Continue current medications and follow-up as previously discussed.

## 2022-07-01 ENCOUNTER — Telehealth: Payer: Self-pay

## 2022-07-01 NOTE — Progress Notes (Unsigned)
Care Management & Coordination Services Pharmacy Team  Reason for Encounter: Appointment Reminder  Contacted patient to confirm in office appointment with Al Corpus, PharmD  on 07/02/22 at 9:00. Unsuccessful outreach. Left voicemail for patient to return call.  Do you have any problems getting your medications? {yes/no:20286} If yes what types of problems are you experiencing? {Problems:27223}  What is your top health concern you would like to discuss at your upcoming visit?   Have you seen any other providers since your last visit with PCP? {yes/no:20286}   Chart review:  Recent office visits:  06/06/22-Spencer Copland,MD(fam med)-right knee pain- injection of kenalog and lidocaine -take voltaren 75mg  two times daily. 02/20/22-Spencer Copland,MD(fam med)- rt.knee OA, injection of monovisc. 02/19/22- Amy Bedsole,MD(PCP)-AWV,discussed vaccines, screenings, try alternate medication for bladder control,solifenacin succinate 5mg  take daily   Recent consult visits:  06/18/22- Cardiology labs  05/30/22-Christopher End,MD(cardio)-f/u CAD, no medication changes, f/u 6 months  03/15/22-Zhou Yu,MD(onco)-f/u breast CA,recommend calcium and vit D supplementation,zometa 4mg  every 6 months, Plan total 5 years, will discontinue at her next visit in Sept 2024  02/28/22-Christopher End,MD(cardio)-f/u CAD,Start amlodipine 5mg  once daily  f/u 3 months  01/24/22-Spencer Copland,MD(fam med)- visco injectrion right knee   Hospital visits:  None in previous 6 months   Star Rating Drugs:  Medication:  Last Fill: Day Supply Atorvastatin 40mg  06/13/22 90 Losartan    Care Gaps: Annual wellness visit in last year? Yes  If Diabetic: Last eye exam / retinopathy screening:UTD Last diabetic foot exam:2023   Al Corpus, PharmD notified  Burt Knack, [facility] Clinical Pharmacy Assistant [phone]

## 2022-07-02 ENCOUNTER — Encounter: Payer: Medicare PPO | Admitting: Pharmacist

## 2022-07-02 ENCOUNTER — Other Ambulatory Visit: Payer: Self-pay | Admitting: Family Medicine

## 2022-07-02 NOTE — Progress Notes (Unsigned)
Care Management & Coordination Services Pharmacy Note  07/02/2022 Name:  Ashley Ryan MRN:  [id number] DOB:  Apr 03, 1945  Summary: ***  Recommendations/Changes made from today's visit: ***  Follow up plan: -Health Concierge will call patient *** -Pharmacist follow up televisit scheduled for *** -PCP 08/20/22; Mammo 09/02/22; Heme/onc 09/30/22; Cardiology 12/05/22   Subjective: Ashley Ryan is an 77 y.o. year old female who is a primary patient of Bedsole, Amy E, MD.  The care coordination team was consulted for assistance with disease management and care coordination needs.    Engaged with patient face to face for initial visit.  Recent office visits: 06/06/22 Dr Patsy Lager OV: OA R knee - Rx diclofenac 75 mg, voltaren gel  02/20/22 Dr Patsy Lager OV: OA R knee - injection  02/19/22 Dr Ermalene Searing OV: annual - d/c lexapro, d/c tolterodine. Rx solifenacin (trial)  Recent consult visits: 05/30/22 Dr End (Cardiology): f/u - euvolemic. C/o sporadic fatigue/hypotension. No med change d/t this. K low, increse Klor con to 20 meq BID.  03/15/22 Dr Cathie Hoops (Oncology): f/u breast cancer, IDA. Rx iron-Vitamin C.   Hospital visits: None in previous 6 months   Objective:  Lab Results  Component Value Date   CREATININE 0.97 06/18/2022   BUN 17 06/18/2022   GFR 75.30 05/07/2021   GFRNONAA 59 (L) 06/18/2022   GFRAA 101 10/13/2019   NA 139 06/18/2022   K 4.3 06/18/2022   CALCIUM 8.9 06/18/2022   CO2 22 06/18/2022   GLUCOSE 99 06/18/2022    Lab Results  Component Value Date/Time   HGBA1C 6.2 02/12/2022 07:36 AM   HGBA1C 5.7 (A) 08/17/2021 11:36 AM   HGBA1C 6.2 01/30/2021 09:11 AM   GFR 75.30 05/07/2021 04:05 PM   GFR 81.86 01/30/2021 09:11 AM   MICROALBUR <0.7 02/12/2022 07:36 AM   MICROALBUR <0.7 03/02/2020 10:22 AM    Last diabetic Eye exam:  Lab Results  Component Value Date/Time   HMDIABEYEEXA No Retinopathy 11/13/2021 12:00 AM    Last diabetic Foot exam:  Lab Results  Component  Value Date/Time   HMDIABFOOTEX done 01/30/2021 12:00 AM     Lab Results  Component Value Date   CHOL 125 02/12/2022   HDL 59.80 02/12/2022   LDLCALC 44 02/12/2022   LDLDIRECT 102.5 07/21/2009   TRIG 104.0 02/12/2022   CHOLHDL 2 02/12/2022       Latest Ref Rng & Units 03/15/2022   10:05 AM 09/10/2021    2:12 PM 05/07/2021    4:05 PM  Hepatic Function  Total Protein 6.5 - 8.1 g/dL 6.4  6.7  6.5   Albumin 3.5 - 5.0 g/dL 3.5  3.7  3.8   AST 15 - 41 U/L 20  18  15    ALT 0 - 44 U/L 11  11  18    Alk Phosphatase 38 - 126 U/L 43  53  49   Total Bilirubin 0.3 - 1.2 mg/dL 0.5  0.5  0.5     Lab Results  Component Value Date/Time   TSH 4.57 05/07/2021 04:05 PM   TSH 1.57 11/12/2016 09:15 AM   FREET4 0.84 04/13/2015 02:30 PM       Latest Ref Rng & Units 03/15/2022   10:05 AM 09/10/2021    2:12 PM 05/07/2021    4:05 PM  CBC  WBC 4.0 - 10.5 K/uL 6.4  5.6  9.4   Hemoglobin 12.0 - 15.0 g/dL 9.8  62.9  52.8   Hematocrit 36.0 - 46.0 % 32.1  35.8  34.4   Platelets 150 - 400 K/uL 176  189  148.0    Iron/TIBC/Ferritin/ %Sat    Component Value Date/Time   IRON 56 03/15/2022 1005   TIBC 356 03/15/2022 1005   FERRITIN 24 03/15/2022 1005   IRONPCTSAT 16 03/15/2022 1005    Lab Results  Component Value Date/Time   VD25OH 32.46 05/26/2018 09:13 AM   VD25OH 38.76 05/14/2016 08:17 AM   VITAMINB12 484 03/29/2022 09:56 AM   VITAMINB12 937 (H) 09/10/2021 02:12 PM    Clinical ASCVD: Yes  The ASCVD Risk score (Arnett DK, et al., 2019) failed to calculate for the following reasons:   The valid total cholesterol range is 130 to 320 mg/dL       1/61/0960    4:54 AM 02/12/2022    9:12 AM 08/17/2021   12:11 PM  Depression screen PHQ 2/9  Decreased Interest 0 0 0  Down, Depressed, Hopeless 0 0 0  PHQ - 2 Score 0 0 0  Altered sleeping 0 0 0  Tired, decreased energy 1 0 2  Change in appetite 0 0 0  Feeling bad or failure about yourself  0 0 0  Trouble concentrating 0 0 0  Moving slowly or  fidgety/restless 0 0 0  Suicidal thoughts 0 0 0  PHQ-9 Score 1 0 2  Difficult doing work/chores Not difficult at all Not difficult at all Somewhat difficult     Social History   Tobacco Use  Smoking Status Never  Smokeless Tobacco Never   BP Readings from Last 3 Encounters:  06/06/22 110/68  05/30/22 138/80  03/29/22 135/69   Pulse Readings from Last 3 Encounters:  06/06/22 82  05/30/22 78  03/29/22 86   Wt Readings from Last 3 Encounters:  06/06/22 124 lb 8 oz (56.5 kg)  05/30/22 128 lb 6 oz (58.2 kg)  03/15/22 127 lb 1.6 oz (57.7 kg)   BMI Readings from Last 3 Encounters:  06/06/22 25.15 kg/m  05/30/22 25.93 kg/m  03/15/22 25.67 kg/m    Allergies  Allergen Reactions   Oxycodone Nausea Only   Sulfa Antibiotics Rash    Childhood reaction   Sulfonamide Derivatives Rash    Childhood reaction.    Medications Reviewed Today     Reviewed by Damita Lack, CMA (Certified Medical Assistant) on 06/06/22 at [phone]  Med List Status: <None>   Medication Order Taking? Sig Documenting Provider Last Dose Status Informant  Alcohol Swabs (DROPSAFE ALCOHOL PREP) 70 % PADS [id number]  USE  TO TEST BLOOD SUGAR ONE TIME DAILY Bedsole, Amy E, MD  Active   amLODipine (NORVASC) 5 MG tablet [id number]  Take 1 tablet (5 mg total) by mouth daily. End, Cristal Deer, MD  Active   atorvastatin (LIPITOR) 40 MG tablet [id number]  TAKE 1 TABLET AT BEDTIME Bedsole, Amy E, MD  Active   Blood Glucose Calibration (ACCU-CHEK AVIVA) SOLN [id number]  Use to check blood sugar once daily Bedsole, Amy E, MD  Active   Blood Glucose Calibration (TRUE METRIX LEVEL 1) Low SOLN [id number]  USE AS DIRECTED Bedsole, Amy E, MD  Active   Blood Glucose Monitoring Suppl (TRUE METRIX AIR GLUCOSE METER) w/Device KIT [id number]  Use to check blood sugar daily Bedsole, Amy E, MD  Active   brimonidine (ALPHAGAN) 0.2 % ophthalmic solution [id number]  brimonidine 0.2 % eye drops  INSTILL 1 DROP INTO BOTH EYES TWICE A DAY  [provider]  Active   Calcium Carb-Cholecalciferol (CALCIUM 600 + D) 600-5 MG-MCG  TABS [id number]  Take 2 tablets by mouth daily. Rickard Patience, MD  Active   cetirizine (ZYRTEC) 10 MG tablet [id number]  Take 10 mg by mouth daily. [provider]  Active   diclofenac Sodium (VOLTAREN) 1 % GEL [id number]  APPLY 2 GRAMS TOPICALLY TO THE AFFECTED AREA 4 TIMES DAILY. [name], MD  Active            Med Note Shawnie Dapper, MARINA C   Fri Oct 12, 2021  8:26 AM) PRN  esomeprazole (NEXIUM) 40 MG capsule [id number]  TAKE 1 CAPSULE TWICE DAILY BEFORE MEALS Bedsole, Amy E, MD  Active   fluticasone (FLONASE) 50 MCG/ACT nasal spray [id number]  USE 2 SPRAYS INTO THE NOSE DAILY AS DIRECTED (SUBSTITUTED FOR FLONASE) Bedsole, Amy E, MD  Active   furosemide (LASIX) 20 MG tablet [id number]  TAKE 1 TABLET BY MOUTH EVERY DAY AS NEEDED Bedsole, Amy E, MD  Active   gabapentin (NEURONTIN) 100 MG capsule [id number]  TAKE 1 CAPSULE EVERY MORNING , 1 CAPSULE AT LUNCH, 1 CAPSULE AT DINNER, AND 2-3 CAPSULES AT BEDTIME Bedsole, Amy E, MD  Active   Glucosamine 500 MG TABS [id number]  Take 500 mg by mouth daily. [provider]  Active Self  glucose blood (TRUE METRIX BLOOD GLUCOSE TEST) test strip [id number]  Use to check blood sugar daily Bedsole, Amy E, MD  Active   Iron-Vitamin C 65-125 MG TABS [id number]  Take 1 tablet by mouth daily at 2 PM. Rickard Patience, MD  Active   isosorbide mononitrate (IMDUR) 30 MG 24 hr tablet [id number]  TAKE 1 TABLET EVERY DAY Bedsole, Amy E, MD  Active   latanoprost (XALATAN) 0.005 % ophthalmic solution [id number]  Place 1 drop into both eyes at bedtime. [provider]  Active Self  lidocaine (LIDODERM) 5 % [id number]  APPLY 1 PATCH ONTO THE SKIN EVERY DAY. REMOVE AND DISCARD PATCH WITHIN 12 HOURS OR AS DIRECTED BY PHYSICIAN Bedsole, Amy E, MD  Active   losartan-hydrochlorothiazide (HYZAAR) 100-25 MG tablet [id number]  Take 1 tablet by mouth daily. [provider]  Active    Omega-3 Fatty Acids (FISH OIL) 1000 MG CAPS [id number]  Take 1,000 mg by mouth daily.  [provider]  Active Self  polycarbophil (FIBERCON) 625 MG tablet [id number]  Take 625 mg by mouth daily. [provider]  Active Self  potassium chloride SA (KLOR-CON M20) 20 MEQ tablet [id number]  Take 1 tablet (20 mEq total) by mouth 2 (two) times daily. End, Cristal Deer, MD  Active   solifenacin (VESICARE) 5 MG tablet [id number]  Take 1 tablet (5 mg total) by mouth daily. [name], MD  Active   tamoxifen (NOLVADEX) 20 MG tablet [id number]  TAKE 1 TABLET EVERY Karen Kays, MD  Active   traMADol (ULTRAM) 50 MG tablet [id number]  TAKE 1 TABLET EVERY 6 HOURS AS NEEDED FOR PAIN Bedsole, Amy E, MD  Active   triamcinolone cream (KENALOG) 0.5 % [id number]  Apply 1 application topically 2 (two) times daily. [name], MD  Active   TRUEplus Lancets 28G MISC [id number]  TEST BLOOD SUGAR EVERY DAY Ermalene Searing, Amy E, MD  Active             SDOH:  (Social Determinants of Health) assessments and interventions performed: {yes/no:20286} SDOH Interventions    Flowsheet Row Office Visit from [facility]06[facility]2022 in [facility] Clinical Support from 02/12/2022 in [facility] Clinical  Support from 12/15/2018 in [facility] [place]  SDOH Interventions     Food Insecurity Interventions -- Intervention Not Indicated --  Housing Interventions -- Intervention Not Indicated --  Transportation Interventions -- Intervention Not Indicated --  Utilities Interventions -- Intervention Not Indicated --  Alcohol Usage Interventions -- Intervention Not Indicated (Score <7) --  Depression Interventions/Treatment  Counseling -- PHQ2-9 Score <4 Follow-up Not Indicated  Financial Strain Interventions -- Intervention Not Indicated --  Physical Activity Interventions -- Patient Refused --  Stress Interventions -- Intervention Not  Indicated --  Social Connections Interventions -- Intervention Not Indicated --       Medication Assistance: {MEDASSISTANCEINFO:25044}  Medication Access: Name and location of current pharmacy:  [id number] - [place], [place] - [address] [place] [address] Phone: [phone] Fax: [phone]  [facility] - [place], [place] - [address] [place] [address] Phone: [phone] Fax: [phone]  [facility] - [place], [place] - [address] [phone] [address] [place] [address] Phone: [phone] Fax: [phone]  Within the past 30 days, how often has patient missed a dose of medication? *** Is a pillbox or other method used to improve adherence? {YES/NO:21197} Factors that may affect medication adherence? {CHL DESC; BARRIERS:21522} Are meds synced by current pharmacy? {YES/NO:21197} Are meds delivered by current pharmacy? {YES/NO:21197} Does patient experience delays in picking up medications due to transportation concerns? {YES/NO:21197}  Compliance/Adherence/Medication fill history: Care Gaps: Foot exam (due 01/2022) - A1c in prediabetic range  Star-Rating Drugs: Atorvastatin - PDC 86% Losartan-HCTZ - PDC 0% - LF 2021   Assessment/Plan   Heart Failure / Hypertension (Goal: BP < 130/80) -{US controlled/uncontrolled:25276} -Current home BP/HR readings: *** -Current home daily weights: *** -Last ejection fraction: 60-65% (Date: 10/2021) -HF type: HFpEF (EF > 50%) -NYHA Class: II (slight limitation of activity) -AHA HF Stage: C (Heart disease and symptoms present) -Current treatment: Amlodipine 5 mg daily Furosemide 20 mg daily PRN Isosorbide MN 30 mg daily Losartan-HCTZ 100-25 mg daily Klor con 20 mEq BID -Medications previously tried: ***  -Current dietary habits: *** -Current exercise habits: *** -Educated on {CCM HF  Counseling:25125} -{CCMPHARMDINTERVENTION:25122}  Hyperlipidemia / CAD (LDL goal < 70) -{US controlled/uncontrolled:25276} -Hx nonobstructive CAD -Current treatment: Atorvastatin 40 mg daily HS Omega 3 fish oil 1000 mg -Medications previously tried: ***  -Current dietary patterns: *** -Current exercise habits: *** -Educated on {CCM HLD Counseling:25126} -{CCMPHARMDINTERVENTION:25122}  Osteoporosis (Goal prevent fractures) -{US controlled/uncontrolled:25276} -Hx breast cancer on tamoxifen; hx fracture -Last DEXA Scan: 08/2021   T-Score total hip: -2.4  T-Score lumbar spine: -2.5 -Patient is a candidate for pharmacologic treatment due to T-Score < -2.5 in total hip  -Current treatment  Zometa IV q6 months (started 08/2021; last 03/29/22) Calcium carb-Vitamin D 2 tab daily -Medications previously tried: Fosamax (tongue soreness)  -{Osteoporosis Counseling:23892} -{CCMPHARMDINTERVENTION:25122}  Anemia (Goal: ***) -{US controlled/uncontrolled:25276} - HGB 9.8 (02/2022), TSAT 16 (02/2022) -Current treatment  Iron-Vitamin C 65-125 mg daily -Medications previously tried: ***  -{CCMPHARMDINTERVENTION:25122}  Depression/Anxiety (Goal: ***) -{US controlled/uncontrolled:25276} -PHQ9: 1 (05/2022) - minimal depression -GAD7: not on file -Connected with PCP for mental health support -Current treatment: *** -Medications previously tried/failed: citalopram (SE), fluoxetine (DDI), escitalopram -Educated on {CCM mental health counseling:25127} -{CCMPHARMDINTERVENTION:25122}  Health Maintenance -Vaccine gaps: *** -Hx Diabetes: A1c 6.2% in prediabetic range -Hx stomach ulcer: on chronic PPI. Not on aspirin. -Hx  breast cancer: on tamoxifen 20 mg 2019 - present -OAB: on solifenacin 5 mg -Allergies: cetirizine 10 mg, flonase PRN -Glaucoma: latanoprost 0.05% eye drops; brimonidine 0.2% eye drops -Pain: diclofenac gel; lidocaine 5% patch; gabapentin 100 mg, tramadol 50 mg, diclofenac 75  mg   ***

## 2022-07-02 NOTE — Telephone Encounter (Signed)
Last office visit 06/06/22 with Dr. Patsy Lager for knee pain.  Last refilled 08/29/21 for #90 with no refill.  Next Appt: 08/20/22 for DM.

## 2022-08-09 DIAGNOSIS — H04123 Dry eye syndrome of bilateral lacrimal glands: Secondary | ICD-10-CM | POA: Diagnosis not present

## 2022-08-09 DIAGNOSIS — Z0100 Encounter for examination of eyes and vision without abnormal findings: Secondary | ICD-10-CM | POA: Diagnosis not present

## 2022-08-09 DIAGNOSIS — H18513 Endothelial corneal dystrophy, bilateral: Secondary | ICD-10-CM | POA: Diagnosis not present

## 2022-08-09 DIAGNOSIS — H401133 Primary open-angle glaucoma, bilateral, severe stage: Secondary | ICD-10-CM | POA: Diagnosis not present

## 2022-08-09 DIAGNOSIS — H353132 Nonexudative age-related macular degeneration, bilateral, intermediate dry stage: Secondary | ICD-10-CM | POA: Diagnosis not present

## 2022-08-18 NOTE — Progress Notes (Unsigned)
    Ashley Tholl T. Charlett [name], MD, CAQ Sports Medicine [facility] at [facility] [address] [place], [address]  Phone: [phone]  FAX: [phone]  Ashley Ryan - 77 y.o. female  MRN [id number]  Date of Birth: [birth year]  Date: 08/19/2022  PCP: [name], MD  Referral: [name], MD  No chief complaint on file.  Subjective:   Ashley Ryan is a 77 y.o. very pleasant female patient with There is no height or weight on file to calculate BMI. who presents with the following:  Patient presents with right-sided knee injury after a fall.  She actually fell several months ago, as well.  She does have some advanced osteoarthritis of the right knee, and she presents with knee pain today after her fall.    Review of Systems is noted in the HPI, as appropriate  Objective:   There were no vitals taken for this visit.  GEN: No acute distress; alert,appropriate. PULM: Breathing comfortably in no respiratory distress PSYCH: Normally interactive.   Laboratory and Imaging Data:  Assessment and Plan:   ***

## 2022-08-19 ENCOUNTER — Ambulatory Visit (INDEPENDENT_AMBULATORY_CARE_PROVIDER_SITE_OTHER): Payer: Medicare PPO | Admitting: Family Medicine

## 2022-08-19 ENCOUNTER — Encounter: Payer: Self-pay | Admitting: Family Medicine

## 2022-08-19 ENCOUNTER — Ambulatory Visit (INDEPENDENT_AMBULATORY_CARE_PROVIDER_SITE_OTHER)
Admission: RE | Admit: 2022-08-19 | Discharge: 2022-08-19 | Disposition: A | Discharge status: Home / Self Care | Payer: Medicare PPO | Source: Ambulatory Visit | Attending: Family Medicine | Admitting: Family Medicine

## 2022-08-19 VITALS — BP 150/76 | HR 101 | Temp 98.3°F | Ht 59.0 in | Wt 119.5 lb

## 2022-08-19 DIAGNOSIS — L03032 Cellulitis of left toe: Secondary | ICD-10-CM | POA: Diagnosis not present

## 2022-08-19 DIAGNOSIS — M79675 Pain in left toe(s): Secondary | ICD-10-CM | POA: Diagnosis not present

## 2022-08-19 DIAGNOSIS — W19XXXA Unspecified fall, initial encounter: Secondary | ICD-10-CM

## 2022-08-19 DIAGNOSIS — M1711 Unilateral primary osteoarthritis, right knee: Secondary | ICD-10-CM

## 2022-08-19 DIAGNOSIS — M25561 Pain in right knee: Secondary | ICD-10-CM

## 2022-08-19 DIAGNOSIS — S92512A Displaced fracture of proximal phalanx of left lesser toe(s), initial encounter for closed fracture: Secondary | ICD-10-CM | POA: Diagnosis not present

## 2022-08-19 DIAGNOSIS — M25461 Effusion, right knee: Secondary | ICD-10-CM | POA: Diagnosis not present

## 2022-08-19 MED ORDER — DICLOFENAC SODIUM 75 MG PO TBEC: 75.0000 mg | DELAYED_RELEASE_TABLET | Freq: Two times a day (BID) | ORAL | 1 refills | Status: DC

## 2022-08-19 MED ORDER — CEPHALEXIN 500 MG PO CAPS: 500.0000 mg | ORAL_CAPSULE | Freq: Three times a day (TID) | ORAL | 0 refills | Status: DC

## 2022-08-20 ENCOUNTER — Ambulatory Visit: Payer: Medicare PPO | Admitting: Family Medicine

## 2022-08-20 ENCOUNTER — Encounter: Payer: Self-pay | Admitting: Family Medicine

## 2022-08-20 DIAGNOSIS — M545 Low back pain, unspecified: Secondary | ICD-10-CM | POA: Diagnosis not present

## 2022-08-20 DIAGNOSIS — R42 Dizziness and giddiness: Secondary | ICD-10-CM | POA: Diagnosis not present

## 2022-08-21 ENCOUNTER — Other Ambulatory Visit: Payer: Self-pay | Admitting: Family Medicine

## 2022-08-25 ENCOUNTER — Other Ambulatory Visit: Payer: Self-pay | Admitting: Oncology

## 2022-08-26 ENCOUNTER — Encounter: Payer: Self-pay | Admitting: Urgent Care

## 2022-08-27 ENCOUNTER — Ambulatory Visit: Payer: Medicare PPO | Admitting: Family Medicine

## 2022-08-28 ENCOUNTER — Encounter: Payer: Self-pay | Admitting: Family Medicine

## 2022-09-02 ENCOUNTER — Ambulatory Visit
Admission: RE | Admit: 2022-09-02 | Discharge: 2022-09-02 | Disposition: A | Discharge status: Home / Self Care | Payer: Medicare PPO | Source: Ambulatory Visit | Attending: Oncology | Admitting: Oncology

## 2022-09-02 DIAGNOSIS — Z853 Personal history of malignant neoplasm of breast: Secondary | ICD-10-CM | POA: Diagnosis not present

## 2022-09-02 DIAGNOSIS — Z1231 Encounter for screening mammogram for malignant neoplasm of breast: Secondary | ICD-10-CM | POA: Insufficient documentation

## 2022-09-02 DIAGNOSIS — D0512 Intraductal carcinoma in situ of left breast: Secondary | ICD-10-CM

## 2022-09-04 ENCOUNTER — Ambulatory Visit: Payer: Medicare PPO | Admitting: Family Medicine

## 2022-09-05 ENCOUNTER — Other Ambulatory Visit: Payer: Self-pay | Admitting: Family Medicine

## 2022-09-05 NOTE — Telephone Encounter (Signed)
Last office visit 08/19/22 for right knee pain/fall.  Last refilled 07/02/22 for #90 with no refills.  Next Appt: No future appointments with PCP.

## 2022-09-06 ENCOUNTER — Telehealth: Payer: Self-pay | Admitting: Family Medicine

## 2022-09-06 MED ORDER — CEPHALEXIN 500 MG PO CAPS: 500.0000 mg | ORAL_CAPSULE | Freq: Three times a day (TID) | ORAL | 0 refills | Status: DC

## 2022-09-06 NOTE — Telephone Encounter (Signed)
Patient called regarding visit from 7/29 with Dr. Patsy Lager. Says her toe is red and swelling again, patient was wondering if [facility] could send in an other RX for antibiotic she was taking in order to get rid of this. Advised patient that usually providers will want another evaluation before re sending abx. Patient understood and I advised I would send a message, please advise patient if needed. Sent to [facility] pool and pcp pool for review

## 2022-09-06 NOTE — Telephone Encounter (Signed)
I called and spoke to her directly myself.  She did have significant improvement on oral antibiotics, and the redness got better and pain got better.  After she completed her antibiotics, the redness and pain has started to return.   I think it is reasonable to give her another round of ABX, f/u if still with symptoms after this.

## 2022-09-30 ENCOUNTER — Telehealth: Payer: Self-pay | Admitting: Oncology

## 2022-09-30 ENCOUNTER — Inpatient Hospital Stay: Payer: Medicare PPO

## 2022-09-30 ENCOUNTER — Inpatient Hospital Stay: Payer: Medicare PPO | Admitting: Oncology

## 2022-09-30 NOTE — Telephone Encounter (Signed)
Patient called to r/s appointments she said if she can come the following week. Please call her to r/s

## 2022-10-01 ENCOUNTER — Other Ambulatory Visit: Payer: Self-pay | Admitting: Family Medicine

## 2022-10-02 NOTE — Telephone Encounter (Signed)
Last office visit 08/19/22 for Right Knee Pain with Dr. Patsy Lager.  Last refilled 08.09.2023 for #540 with 5 refills.  Next Appt: 10/10/22 with Dr. Patsy Lager for knee injection.

## 2022-10-08 NOTE — Progress Notes (Unsigned)
Lachelle Rissler T. Tahra Hitzeman, MD, CAQ Sports Medicine [facility] [address] [place], [address]  Phone: [phone]  FAX: [phone]  FELISHA LAMPHERE - 77 y.o. female  MRN [id number]  Date of Birth: [birth year]  Date: 10/10/2022  PCP: [name], MD  Referral: [name], MD  No chief complaint on file.  Subjective:   MARVELLE MORRELL is a 77 y.o. very pleasant female patient with There is no height or weight on file to calculate BMI. who presents with the following:  Procedure only:  Patient presents with chronic right-sided knee pain and chronic osteoarthritis with acute flare and pain today.    ICD-10-CM   1. Primary osteoarthritis of right knee  M17.11 triamcinolone acetonide (KENALOG-40) injection 40 mg    2. Encounter for immunization  Z23 Flu Vaccine Trivalent High Dose (Fluad)     Aspiration/Injection Procedure Note NELLENE YUHAS 01-01-1946 Date of procedure: 10/10/2022  Procedure: Large Joint Aspiration / Injection of Knee, R Indications: Pain  Procedure Details Patient verbally consented to procedure. Risks, benefits, and alternatives explained. Sterilely prepped with Chloraprep. Ethyl cholride used for anesthesia. 9 cc Lidocaine 1% mixed with 1 mL of Kenalog 40 mg injected using the anteromedial approach without difficulty. No complications with procedure and tolerated well. Patient had decreased pain post-injection. Medication: 1 mL of Kenalog 40 mg   Medication Management during today's office visit: Meds ordered this encounter  Medications   triamcinolone acetonide (KENALOG-40) injection 40 mg   There are no discontinued medications.  Orders placed today for conditions managed today: Orders Placed This Encounter  Procedures   Flu Vaccine Trivalent High Dose (Fluad)    Disposition: No follow-ups on file.  Dr[employer] One speech-to-text software was used for transcription in this dictation.  Possible  transcriptional errors can occur using Animal nutritionist.   Signed,  Elpidio Galea. Doriann Zuch, MD   Outpatient Encounter Medications as of 10/10/2022  Medication Sig   Alcohol Swabs (DROPSAFE ALCOHOL PREP) 70 % PADS USE  TO TEST BLOOD SUGAR ONE TIME DAILY   amLODipine (NORVASC) 5 MG tablet Take 1 tablet (5 mg total) by mouth daily.   atorvastatin (LIPITOR) 40 MG tablet TAKE 1 TABLET AT BEDTIME   Blood Glucose Calibration (ACCU-CHEK AVIVA) SOLN Use to check blood sugar once daily   Blood Glucose Calibration (TRUE METRIX LEVEL 1) Low SOLN USE AS DIRECTED   Blood Glucose Monitoring Suppl (TRUE METRIX AIR GLUCOSE METER) w/Device KIT Use to check blood sugar daily   brimonidine (ALPHAGAN) 0.2 % ophthalmic solution brimonidine 0.2 % eye drops  INSTILL 1 DROP INTO BOTH EYES TWICE A DAY   Calcium Carb-Cholecalciferol (CALCIUM 600 + D) 600-5 MG-MCG TABS Take 2 tablets by mouth daily.   cephALEXin (KEFLEX) 500 MG capsule Take 1 capsule (500 mg total) by mouth 3 (three) times daily.   cetirizine (ZYRTEC) 10 MG tablet Take 10 mg by mouth daily.   diclofenac (VOLTAREN) 75 MG EC tablet Take 1 tablet (75 mg total) by mouth 2 (two) times daily.   diclofenac Sodium (VOLTAREN) 1 % GEL APPLY 2 GRAMS TOPICALLY TO THE AFFECTED AREA 4 TIMES DAILY.   esomeprazole (NEXIUM) 40 MG capsule TAKE 1 CAPSULE TWICE DAILY BEFORE MEALS   fluticasone (FLONASE) 50 MCG/ACT nasal spray USE 2 SPRAYS INTO THE NOSE DAILY AS DIRECTED (SUBSTITUTED FOR FLONASE)   furosemide (LASIX) 20 MG tablet TAKE 1 TABLET BY MOUTH EVERY DAY AS NEEDED   gabapentin (NEURONTIN)  100 MG capsule TAKE 1 CAPSULE EVERY MORNING , 1 CAPSULE AT LUNCH, 1 CAPSULE AT DINNER, AND 2-3 CAPSULES AT BEDTIME   Glucosamine 500 MG TABS Take 500 mg by mouth daily.   glucose blood (TRUE METRIX BLOOD GLUCOSE TEST) test strip Use to check blood sugar daily   Iron-Vitamin C 65-125 MG TABS Take 1 tablet by mouth daily at 2 PM.   isosorbide mononitrate (IMDUR) 30 MG 24 hr tablet  TAKE 1 TABLET EVERY DAY   latanoprost (XALATAN) 0.005 % ophthalmic solution Place 1 drop into both eyes at bedtime.   lidocaine (LIDODERM) 5 % APPLY 1 PATCH ONTO THE SKIN EVERY DAY. REMOVE AND DISCARD PATCH WITHIN 12 HOURS OR AS DIRECTED BY PHYSICIAN   losartan-hydrochlorothiazide (HYZAAR) 100-25 MG tablet Take 1 tablet by mouth daily.   Omega-3 Fatty Acids (FISH OIL) 1000 MG CAPS Take 1,000 mg by mouth daily.    polycarbophil (FIBERCON) 625 MG tablet Take 625 mg by mouth daily.   potassium chloride SA (KLOR-CON M20) 20 MEQ tablet Take 1 tablet (20 mEq total) by mouth 2 (two) times daily.   solifenacin (VESICARE) 5 MG tablet Take 1 tablet (5 mg total) by mouth daily.   tamoxifen (NOLVADEX) 20 MG tablet TAKE 1 TABLET EVERY DAY   traMADol (ULTRAM) 50 MG tablet Take 1 tablet (50 mg total) by mouth every 6 (six) hours as needed. for pain   triamcinolone cream (KENALOG) 0.5 % Apply 1 application topically 2 (two) times daily.   TRUEplus Lancets 28G MISC TEST BLOOD SUGAR EVERY DAY   [EXPIRED] triamcinolone acetonide (KENALOG-40) injection 40 mg    No facility-administered encounter medications on file as of 10/10/2022.

## 2022-10-10 ENCOUNTER — Encounter: Payer: Self-pay | Admitting: Family Medicine

## 2022-10-10 ENCOUNTER — Ambulatory Visit: Payer: Medicare PPO | Admitting: Family Medicine

## 2022-10-10 VITALS — BP 120/70 | HR 76 | Temp 98.4°F | Ht 59.0 in | Wt 119.1 lb

## 2022-10-10 DIAGNOSIS — M1711 Unilateral primary osteoarthritis, right knee: Secondary | ICD-10-CM

## 2022-10-10 DIAGNOSIS — Z23 Encounter for immunization: Secondary | ICD-10-CM

## 2022-10-10 MED ORDER — TRIAMCINOLONE ACETONIDE 40 MG/ML IJ SUSP
40.0000 mg | Freq: Once | INTRAMUSCULAR | Status: AC
Start: 2022-10-10 — End: 2022-10-10
  Administered 2022-10-10: 40 mg via INTRA_ARTICULAR

## 2022-10-15 ENCOUNTER — Ambulatory Visit (INDEPENDENT_AMBULATORY_CARE_PROVIDER_SITE_OTHER): Payer: Medicare PPO

## 2022-10-15 VITALS — BP 117/62 | HR 64 | Ht 59.5 in | Wt 119.0 lb

## 2022-10-15 DIAGNOSIS — Z0000 Encounter for general adult medical examination without abnormal findings: Secondary | ICD-10-CM

## 2022-10-15 NOTE — Patient Instructions (Signed)
Ashley Ryan , Thank you for taking time to come for your Medicare Wellness Visit. I appreciate your ongoing commitment to your health goals. Please review the following plan we discussed and let me know if I can assist you in the future.   Referrals/Orders/Follow-Ups/Clinician Recommendations: none  This is a list of the screening recommended for you and due dates:  Health Maintenance  Topic Date Due   DTaP/Tdap/Td vaccine (3 - Td or Tdap) 10/14/2021   Complete foot exam   01/30/2022   Hemoglobin A1C  08/13/2022   COVID-19 Vaccine (3 - Pfizer risk series) 10/31/2022*   Zoster (Shingles) Vaccine (1 of 2) 01/14/2023*   Eye exam for diabetics  11/14/2022   Yearly kidney health urinalysis for diabetes  02/13/2023   Yearly kidney function blood test for diabetes  06/18/2023   Mammogram  09/02/2023   Medicare Annual Wellness Visit  10/15/2023   Colon Cancer Screening  06/03/2024   Pneumonia Vaccine  Completed   Flu Shot  Completed   DEXA scan (bone density measurement)  Completed   Hepatitis C Screening  Completed   HPV Vaccine  Aged Out  *Topic was postponed. The date shown is not the original due date.    Advanced directives: (Declined) Advance directive discussed with you today. Even though you declined this today, please call our office should you change your mind, and we can give you the proper paperwork for you to fill out.  Next Medicare Annual Wellness Visit scheduled for next year: Yes 10/20/2023 @ 1pm telephone  needed

## 2022-10-15 NOTE — Progress Notes (Signed)
Subjective:   Ashley Ryan is a 77 y.o. female who presents for Medicare Annual (Subsequent) preventive examination.  Visit Complete: Virtual  I connected with  [name] on 10/15/22 by a audio enabled telemedicine application and verified that I am speaking with the correct person using two identifiers.  Patient Location: Home  Provider Location: Office/Clinic  I discussed the limitations of evaluation and management by telemedicine. The patient expressed understanding and agreed to proceed.  Vital Signs: Vital signs are patient reported.  Patient Medicare AWV questionnaire was completed by the patient on (not done); I have confirmed that all information answered by patient is correct and no changes since this date.  Cardiac Risk Factors include: advanced age (>20men, >78 women);diabetes mellitus;dyslipidemia;hypertension;sedentary lifestyle     Objective:    Today's Vitals   10/15/22 1305 10/15/22 1307  BP: 117/62   Pulse: 64   Weight: 119 lb (54 kg)   Height: 4' 11.5" (1.511 m)   PainSc:  8    Body mass index is 23.63 kg/m.     10/15/2022    1:24 PM 03/29/2022    9:00 AM 02/12/2022    9:14 AM 03/14/2021    1:02 PM 09/07/2020    1:29 PM 08/09/2019    5:00 PM 06/04/2019    7:49 AM  Advanced Directives  Does Patient Have a Medical Advance Directive? No No No No No No No  Type of Advance Directive Healthcare Power of Attorney        Would patient like information on creating a medical advance directive?  No - Patient declined No - Patient declined  No - Patient declined No - Patient declined No - Patient declined    Current Medications (verified) Outpatient Encounter Medications as of 10/15/2022  Medication Sig   Alcohol Swabs (DROPSAFE ALCOHOL PREP) 70 % PADS USE  TO TEST BLOOD SUGAR ONE TIME DAILY   amLODipine (NORVASC) 5 MG tablet Take 1 tablet (5 mg total) by mouth daily.   atorvastatin (LIPITOR) 40 MG tablet TAKE 1 TABLET AT BEDTIME   Blood Glucose  Calibration (ACCU-CHEK AVIVA) SOLN Use to check blood sugar once daily   Blood Glucose Calibration (TRUE METRIX LEVEL 1) Low SOLN USE AS DIRECTED   Blood Glucose Monitoring Suppl (TRUE METRIX AIR GLUCOSE METER) w/Device KIT Use to check blood sugar daily   brimonidine (ALPHAGAN) 0.2 % ophthalmic solution brimonidine 0.2 % eye drops  INSTILL 1 DROP INTO BOTH EYES TWICE A DAY   Calcium Carb-Cholecalciferol (CALCIUM 600 + D) 600-5 MG-MCG TABS Take 2 tablets by mouth daily.   cetirizine (ZYRTEC) 10 MG tablet Take 10 mg by mouth daily.   diclofenac (VOLTAREN) 75 MG EC tablet Take 1 tablet (75 mg total) by mouth 2 (two) times daily.   diclofenac Sodium (VOLTAREN) 1 % GEL APPLY 2 GRAMS TOPICALLY TO THE AFFECTED AREA 4 TIMES DAILY.   esomeprazole (NEXIUM) 40 MG capsule TAKE 1 CAPSULE TWICE DAILY BEFORE MEALS   fluticasone (FLONASE) 50 MCG/ACT nasal spray USE 2 SPRAYS INTO THE NOSE DAILY AS DIRECTED (SUBSTITUTED FOR FLONASE)   furosemide (LASIX) 20 MG tablet TAKE 1 TABLET BY MOUTH EVERY DAY AS NEEDED   gabapentin (NEURONTIN) 100 MG capsule TAKE 1 CAPSULE EVERY MORNING , 1 CAPSULE AT LUNCH, 1 CAPSULE AT DINNER, AND 2-3 CAPSULES AT BEDTIME   Glucosamine 500 MG TABS Take 500 mg by mouth daily.   glucose blood (TRUE METRIX BLOOD GLUCOSE TEST) test strip Use to check blood sugar  daily   Iron-Vitamin C 65-125 MG TABS Take 1 tablet by mouth daily at 2 PM.   isosorbide mononitrate (IMDUR) 30 MG 24 hr tablet TAKE 1 TABLET EVERY DAY   latanoprost (XALATAN) 0.005 % ophthalmic solution Place 1 drop into both eyes at bedtime.   lidocaine (LIDODERM) 5 % APPLY 1 PATCH ONTO THE SKIN EVERY DAY. REMOVE AND DISCARD PATCH WITHIN 12 HOURS OR AS DIRECTED BY PHYSICIAN   losartan-hydrochlorothiazide (HYZAAR) 100-25 MG tablet Take 1 tablet by mouth daily.   Omega-3 Fatty Acids (FISH OIL) 1000 MG CAPS Take 1,000 mg by mouth daily.    polycarbophil (FIBERCON) 625 MG tablet Take 625 mg by mouth daily.   potassium chloride SA  (KLOR-CON M20) 20 MEQ tablet Take 1 tablet (20 mEq total) by mouth 2 (two) times daily.   solifenacin (VESICARE) 5 MG tablet Take 1 tablet (5 mg total) by mouth daily.   tamoxifen (NOLVADEX) 20 MG tablet TAKE 1 TABLET EVERY DAY   traMADol (ULTRAM) 50 MG tablet Take 1 tablet (50 mg total) by mouth every 6 (six) hours as needed. for pain   triamcinolone cream (KENALOG) 0.5 % Apply 1 application topically 2 (two) times daily.   TRUEplus Lancets 28G MISC TEST BLOOD SUGAR EVERY DAY   cephALEXin (KEFLEX) 500 MG capsule Take 1 capsule (500 mg total) by mouth 3 (three) times daily. (Patient not taking: Reported on 10/15/2022)   No facility-administered encounter medications on file as of 10/15/2022.    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  Mastectomy/ SLN.   Chronic headache    Complication of anesthesia    prior to 1991 used to have PONV.  none recently.   Diverticulosis of colon    Family history of adverse reaction to anesthesia    mother/daighter get sick   GERD with stricture    Hard of hearing    Heart murmur    followed by PCP   Hiatal hernia    Hypertension    Neuropathy    feet   Osteoporosis    Pre-diabetes    Wears dentures    full upper   Past Surgical History:  Procedure Laterality Date   ABDOMINAL HYSTERECTOMY  1978   one ovary remains   BREAST BIOPSY Left 05/26/2017   Affirm Bx- coil clip Ductal carcinoma in situ, high nuclear grade with calcifications and focal comedonecrosis   CARDIAC CATHETERIZATION     "yrs ago" all OK.   CATARACT EXTRACTION W/PHACO Left 02/21/2016   Procedure: CATARACT EXTRACTION PHACO AND INTRAOCULAR LENS PLACEMENT (IOC)  Left eye;  Surgeon: Lockie Mola, MD;  Location: [facility];  Service: Ophthalmology;  Laterality: Left;  Left  eye Diabetic   CATARACT EXTRACTION W/PHACO Right 03/13/2016   Procedure: CATARACT EXTRACTION PHACO AND INTRAOCULAR LENS PLACEMENT (IOC)  right  diabetic;  Surgeon: Lockie Mola, MD;  Location: [facility];  Service: Ophthalmology;  Laterality: Right;  diabetic   CHOLECYSTECTOMY  1978   COLONOSCOPY W/ POLYPECTOMY  09/09/2013   8 mm tubular adenoma of the cecum.  Melvia Heaps, MD. [place] [facility]   COLONOSCOPY WITH PROPOFOL N/A 06/04/2019   Procedure: COLONOSCOPY WITH PROPOFOL;  Surgeon: Midge Minium, MD;  Location: [facility] END[facility];  Service: Endoscopy;  Laterality: N/A;   ESOPHAGOGASTRODUODENOSCOPY (EGD) WITH PROPOFOL N/A 06/04/2019  Procedure: ESOPHAGOGASTRODUODENOSCOPY (EGD) WITH PROPOFOL;  Surgeon: Midge Minium, MD;  Location: [facility] END[facility];  Service: Endoscopy;  Laterality: N/A;   JOINT REPLACEMENT Left    knee    LEFT HEART CATH AND CORONARY ANGIOGRAPHY N/A 11/06/2017   Procedure: LEFT HEART CATH AND CORONARY ANGIOGRAPHY;  Surgeon: Yvonne Kendall, MD;  Location: [facility] INVASIVE CV LAB;  Service: Cardiovascular;  Laterality: N/A;   MASTECTOMY Left 06/13/2017   mastectomy neg margins   OOPHORECTOMY     one still left   RENAL ANGIOGRAPHY  11/06/2017   Procedure: RENAL ANGIOGRAPHY;  Surgeon: Yvonne Kendall, MD;  Location: [facility] IN[facility] CV LAB;  Service: Cardiovascular;;   SENTINEL NODE BIOPSY Left 06/13/2017   Procedure: SENTINEL NODE BIOPSY;  Surgeon: Earline Mayotte, MD;  Location: [facility] ORS;  Service: General;  Laterality: Left;   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  Surgeon: Earline Mayotte, MD;  Location: [facility] OR[facility];  Service: General;  Laterality: Left;   Family History  Problem Relation Age of Onset   Breast cancer Other    Stomach cancer Other    Diabetes Other    Heart disease Other    Breast cancer Sister 30   Esophageal cancer Brother    Hypertension Mother    Heart attack  Mother 22   Hypertension Father    Coronary artery disease Father 76       CABG   Stroke Father    Social History   Socioeconomic History   Marital status: Married    Spouse name: Not on file   Number of children: 2   Years of education: Not on file   Highest education level: Not on file  Occupational History   Occupation: retired cna    Employer: RETIRED  Tobacco Use   Smoking status: Never   Smokeless tobacco: Never  Vaping Use   Vaping status: Never Used  Substance and Sexual Activity   Alcohol use: No   Drug use: No   Sexual activity: Not Currently  Other Topics Concern   Not on file  Social History Narrative   Drinks sweet tea, diet sodas 4 a day   No living will, full code (reviewed 2014)   Social Determinants of Health   Financial Resource Strain: Low Risk  (10/15/2022)   Overall Financial Resource Strain (CARDIA)    Difficulty of Paying Living Expenses: Not hard at all  Food Insecurity: No Food Insecurity (10/15/2022)   Hunger Vital Sign    Worried About Running Out of Food in the Last Year: Never true    Ran Out of Food in the Last Year: Never true  Transportation Needs: Unmet Transportation Needs (10/15/2022)   PRAPARE - Transportation    Lack of Transportation (Medical): Yes    Lack of Transportation (Non-Medical): Yes  Physical Activity: Inactive (10/15/2022)   Exercise Vital Sign    Days of Exercise per Week: 0 days    Minutes of Exercise per Session: 0 min  Stress: No Stress Concern Present (10/15/2022)   [employer] of Occupational Health - Occupational Stress Questionnaire    Feeling of Stress : Not at all  Social Connections: Moderately Isolated (10/15/2022)   Social Connection and Isolation Panel [NHANES]    Frequency of Communication with Friends and Family: More than three times a week    Frequency of Social Gatherings with Friends and Family: Once a week    Attends Religious Services: Never  Active Member of Clubs or Organizations: No     Attends Banker Meetings: Never    Marital Status: Married    Tobacco Counseling Counseling given: Not Answered   Clinical Intake:  Pre-visit preparation completed: Yes  Pain : 0-10 Pain Score: 8  Pain Type: Chronic pain Pain Location: Generalized Pain Descriptors / Indicators: Aching Pain Onset: More than a month ago Pain Frequency: Constant Pain Relieving Factors: medication,analgesic/diclofenac creams  Pain Relieving Factors: medication,analgesic/diclofenac creams  BMI - recorded: 23.63 Nutritional Status: BMI of 19-24  Normal Nutritional Risks: None Diabetes: Yes CBG done?: No CBG resulted in Enter/ Edit results?: No Did pt. bring in CBG monitor from home?: No Glucose Meter Downloaded?: No  How often do you need to have someone help you when you read instructions, pamphlets, or other written materials from your doctor or pharmacy?: 2 - Rarely  Interpreter Needed?: No  Comments: lives with husband Information entered by :: B.Sadaf Przybysz,LPN   Activities of Daily Living    10/15/2022    1:24 PM 02/12/2022    9:15 AM  In your present state of health, do you have any difficulty performing the following activities:  Hearing? 0 1  Vision? 1 0  Difficulty concentrating or making decisions? 1 0  Walking or climbing stairs? 1 1  Dressing or bathing? 0 0  Doing errands, shopping? 1 0  Preparing Food and eating ? N N  Using the Toilet? N N  In the past six months, have you accidently leaked urine? Y Y  Do you have problems with loss of bowel control? N N  Managing your Medications? N N  Managing your Finances? N N  Housekeeping or managing your Housekeeping? N N    Patient Care Team: [name], MD as PCP - General (Family Medicine) End, Cristal Deer, MD as PCP - Cardiology (Cardiology) Lockie Mola, MD as Referring Physician (Ophthalmology) Lemar Livings Merrily Pew, MD (General Surgery) Rickard Patience, MD as Consulting Physician  (Oncology)  Indicate any recent Medical Services you may have received from other than [facility] providers in the past year (date may be approximate).     Assessment:   This is a routine wellness examination for Ashley Ryan.  Hearing/Vision screen Hearing Screening - Comments:: Pt says she wears hearing aids in both ears:hears well with them Vision Screening - Comments:: Pt says vision is blurry some times and wears glasses [facility]   Goals Addressed             This Visit's Progress    COMPLETED: DIET - EAT MORE FRUITS AND VEGETABLES   On track    Increase physical activity   Not on track    Starting 11/12/2016, I will continue to exercise for at least 15 min daily.      Patient Stated   Not on track    12/15/2018, I will try to start exercising more daily.        Depression Screen    10/15/2022    1:21 PM 10/10/2022   11:51 AM 06/06/2022    9:34 AM 02/12/2022    9:12 AM 08/17/2021   12:11 PM 01/30/2021    9:19 AM 07/27/2020    8:31 AM  PHQ 2/9 Scores  PHQ - 2 Score 1 1 0 0 0 0 0  PHQ- 9 Score  3 1 0 2 1 2     Fall Risk    10/15/2022    1:13 PM 10/10/2022   11:51 AM 02/12/2022  9:14 AM 01/30/2021    8:20 AM 01/28/2020   10:02 AM  Fall Risk   Falls in the past year? 1 1 1 1 1   Number falls in past yr: 1 1 1 1 1   Comment 4 falls:feet "gets tangled up" reason for falls      Injury with Fall? 0 0 1 0 0  Risk for fall due to : History of fall(s);Orthopedic patient History of fall(s) History of fall(s)    Follow up Education provided;Falls prevention discussed Falls evaluation completed Falls prevention discussed;Falls evaluation completed      MEDICARE RISK AT HOME: Medicare Risk at Home Any stairs in or around the home?: Yes If so, are there any without handrails?: Yes Home free of loose throw rugs in walkways, pet beds, electrical cords, etc?: Yes Adequate lighting in your home to reduce risk of falls?: Yes Life alert?: No Use of a cane, walker or w/c?: Yes  (cane; walker occassionallt) Grab bars in the bathroom?: Yes Shower chair or bench in shower?: Yes Elevated toilet seat or a handicapped toilet?: Yes  TIMED UP AND GO:  Was the test performed?  No    Cognitive Function:    12/15/2018   12:22 PM 11/12/2016    8:45 AM 11/09/2015   10:20 AM  MMSE - Mini Mental State Exam  Orientation to time 5 5 5   Orientation to Place 5 5 5   Registration 3 3 3   Attention/ Calculation 5 0 0  Recall 3 3 2   Recall-comments   pt was unable to recall 1 of 3 words  Language- name 2 objects  0 0  Language- repeat 1 1 1   Language- follow 3 step command  3 3  Language- read & follow direction  0 0  Write a sentence  0 0  Copy design  0 0  Total score  20 19        10/15/2022    1:26 PM 02/12/2022    9:20 AM  6CIT Screen  What Year? 0 points 0 points  What month? 0 points 0 points  What time? 0 points 3 points  Count back from 20 0 points 0 points  Months in reverse 0 points 0 points  Repeat phrase 0 points 0 points  Total Score 0 points 3 points    Immunizations Immunization History  Administered Date(s) Administered   Fluad Quad(high Dose 65+) 11/17/2018, 10/27/2019, 01/30/2021, 10/18/2021   Fluad Trivalent(High Dose 65+) 10/10/2022   Influenza Split 10/08/2010, 10/15/2011   Influenza Whole 11/03/2008, 11/16/2009   Influenza,inj,Quad PF,6+ Mos 10/29/2013, 11/01/2014, 11/09/2015, 12/05/2016, 10/30/2017   PFIZER(Purple Top)SARS-COV-2 Vaccination 02/25/2019, 03/18/2019   PPD Test 05/03/2011, 05/04/2012, 11/27/2013, 06/06/2015   Pneumococcal Conjugate-13 10/29/2013   Pneumococcal Polysaccharide-23 12/22/2007, 11/09/2015   Td 01/22/2007   Tdap 10/15/2011    TDAP status: Up to date  Flu Vaccine status: Up to date  Pneumococcal vaccine status: Up to date  Covid-19 vaccine status: Completed vaccines  Qualifies for Shingles Vaccine? Yes   Zostavax completed No   Shingrix Completed?: No.    Education has been provided regarding the  importance of this vaccine. Patient has been advised to call insurance company to determine out of pocket expense if they have not yet received this vaccine. Advised may also receive vaccine at local pharmacy or Health Dept. Verbalized acceptance and understanding.  Screening Tests Health Maintenance  Topic Date Due   DTaP/Tdap/Td (3 - Td or Tdap) 10/14/2021   FOOT EXAM  01/30/2022  HEMOGLOBIN A1C  08/13/2022   COVID-19 Vaccine (3 - Pfizer risk series) 10/31/2022 (Originally 04/15/2019)   Zoster Vaccines- Shingrix (1 of 2) 01/14/2023 (Originally 05/26/1964)   OPHTHALMOLOGY EXAM  11/14/2022   Diabetic kidney evaluation - Urine ACR  02/13/2023   Diabetic kidney evaluation - eGFR measurement  06/18/2023   MAMMOGRAM  09/02/2023   Medicare Annual Wellness (AWV)  10/15/2023   Colonoscopy  06/03/2024   Pneumonia Vaccine 71+ Years old  Completed   INFLUENZA VACCINE  Completed   DEXA SCAN  Completed   Hepatitis C Screening  Completed   HPV VACCINES  Aged Out    Health Maintenance  Health Maintenance Due  Topic Date Due   DTaP/Tdap/Td (3 - Td or Tdap) 10/14/2021   FOOT EXAM  01/30/2022   HEMOGLOBIN A1C  08/13/2022    Colorectal cancer screening: No longer required.   Mammogram status: No longer required due to age.  Bone Density status: Completed yes. Results reflect: Bone density results: OSTEOPENIA. Repeat every 3 years.  Lung Cancer Screening: (Low Dose CT Chest recommended if Age 63-80 years, 20 pack-year currently smoking OR have quit w/in 15years.) does not qualify.   Lung Cancer Screening Referral: no  Additional Screening:  Hepatitis C Screening: does not qualify; Completed yes  Vision Screening: Recommended annual ophthalmology exams for early detection of glaucoma and other disorders of the eye. Is the patient up to date with their annual eye exam?  Yes  Who is the provider or what is the name of the office in which the patient attends annual eye exams? Dr Inez Pilgrim If  pt is not established with a provider, would they like to be referred to a provider to establish care? No .   Dental Screening: Recommended annual dental exams for proper oral hygiene  Diabetic Foot Exam: Diabetic Foot Exam: Overdue, Pt has been advised about the importance in completing this exam. Pt is scheduled for diabetic foot exam on next PCP.  Community Resource Referral / Chronic Care Management: CRR required this visit?  No   CCM required this visit?  No    Plan:     I have personally reviewed and noted the following in the patient's chart:   Medical and social history Use of alcohol, tobacco or illicit drugs  Current medications and supplements including opioid prescriptions. Patient is not currently taking opioid prescriptions. Functional ability and status Nutritional status Physical activity Advanced directives List of other physicians Hospitalizations, surgeries, and ER visits in previous 12 months Vitals Screenings to include cognitive, depression, and falls Referrals and appointments  In addition, I have reviewed and discussed with patient certain preventive protocols, quality metrics, and best practice recommendations. A written personalized care plan for preventive services as well as general preventive health recommendations were provided to patient.    Sue Lush, LPN   5/62/[id number]   After Visit Summary: (MyChart) Due to this being a telephonic visit, the after visit summary with patients personalized plan was offered to patient via MyChart   Nurse Notes: The patient states she is doing well and has no concerns or questions at this time.

## 2022-10-16 ENCOUNTER — Inpatient Hospital Stay: Payer: Medicare PPO

## 2022-10-16 ENCOUNTER — Inpatient Hospital Stay (HOSPITAL_BASED_OUTPATIENT_CLINIC_OR_DEPARTMENT_OTHER): Payer: Medicare PPO | Admitting: Oncology

## 2022-10-16 ENCOUNTER — Encounter: Payer: Self-pay | Admitting: Oncology

## 2022-10-16 ENCOUNTER — Inpatient Hospital Stay: Payer: Medicare PPO | Attending: Oncology

## 2022-10-16 VITALS — BP 133/73 | HR 65 | Temp 98.7°F | Resp 18 | Wt 118.0 lb

## 2022-10-16 DIAGNOSIS — D509 Iron deficiency anemia, unspecified: Secondary | ICD-10-CM | POA: Insufficient documentation

## 2022-10-16 DIAGNOSIS — D0512 Intraductal carcinoma in situ of left breast: Secondary | ICD-10-CM | POA: Diagnosis not present

## 2022-10-16 DIAGNOSIS — M810 Age-related osteoporosis without current pathological fracture: Secondary | ICD-10-CM | POA: Diagnosis not present

## 2022-10-16 DIAGNOSIS — Z9012 Acquired absence of left breast and nipple: Secondary | ICD-10-CM | POA: Diagnosis not present

## 2022-10-16 DIAGNOSIS — E538 Deficiency of other specified B group vitamins: Secondary | ICD-10-CM

## 2022-10-16 DIAGNOSIS — Z79810 Long term (current) use of selective estrogen receptor modulators (SERMs): Secondary | ICD-10-CM | POA: Insufficient documentation

## 2022-10-16 DIAGNOSIS — Z1231 Encounter for screening mammogram for malignant neoplasm of breast: Secondary | ICD-10-CM | POA: Diagnosis not present

## 2022-10-16 LAB — CBC WITH DIFFERENTIAL (CANCER CENTER ONLY)
Abs Immature Granulocytes: 0.02 10*3/uL (ref 0.00–0.07)
Basophils Absolute: 0.1 10*3/uL (ref 0.0–0.1)
Basophils Relative: 1 %
Eosinophils Absolute: 0.1 10*3/uL (ref 0.0–0.5)
Eosinophils Relative: 1 %
HCT: 34.5 % — ABNORMAL LOW (ref 36.0–46.0)
Hemoglobin: 10.4 g/dL — ABNORMAL LOW (ref 12.0–15.0)
Immature Granulocytes: 0 %
Lymphocytes Relative: 25 %
Lymphs Abs: 1.8 10*3/uL (ref 0.7–4.0)
MCH: 26.3 pg (ref 26.0–34.0)
MCHC: 30.1 g/dL (ref 30.0–36.0)
MCV: 87.1 fL (ref 80.0–100.0)
Monocytes Absolute: 0.8 10*3/uL (ref 0.1–1.0)
Monocytes Relative: 10 %
Neutro Abs: 4.5 10*3/uL (ref 1.7–7.7)
Neutrophils Relative %: 63 %
Platelet Count: 151 10*3/uL (ref 150–400)
RBC: 3.96 MIL/uL (ref 3.87–5.11)
RDW: 15.9 % — ABNORMAL HIGH (ref 11.5–15.5)
WBC Count: 7.2 10*3/uL (ref 4.0–10.5)
nRBC: 0 % (ref 0.0–0.2)

## 2022-10-16 LAB — FERRITIN: Ferritin: 19 ng/mL (ref 11–307)

## 2022-10-16 LAB — VITAMIN B12: Vitamin B-12: 285 pg/mL (ref 180–914)

## 2022-10-16 LAB — CMP (CANCER CENTER ONLY)
ALT: 11 U/L (ref 0–44)
AST: 15 U/L (ref 15–41)
Albumin: 4.1 g/dL (ref 3.5–5.0)
Alkaline Phosphatase: 41 U/L (ref 38–126)
Anion gap: 7 (ref 5–15)
BUN: 17 mg/dL (ref 8–23)
CO2: 23 mmol/L (ref 22–32)
Calcium: 9.1 mg/dL (ref 8.9–10.3)
Chloride: 107 mmol/L (ref 98–111)
Creatinine: 0.79 mg/dL (ref 0.44–1.00)
GFR, Estimated: >60 mL/min (ref >60)
Glucose, Bld: 93 mg/dL (ref 70–99)
Potassium: 3.1 mmol/L — ABNORMAL LOW (ref 3.5–5.1)
Sodium: 137 mmol/L (ref 135–145)
Total Bilirubin: 0.3 mg/dL (ref 0.3–1.2)
Total Protein: 6.9 g/dL (ref 6.5–8.1)

## 2022-10-16 LAB — IRON AND TIBC
Iron: 33 ug/dL (ref 28–170)
Saturation Ratios: 10 % — ABNORMAL LOW (ref 10.4–31.8)
TIBC: 343 ug/dL (ref 250–450)
UIBC: 310 ug/dL

## 2022-10-16 MED ORDER — SODIUM CHLORIDE 0.9 % IV SOLN
Freq: Once | INTRAVENOUS | Status: AC
  Filled 2022-10-16: qty 250

## 2022-10-16 MED ORDER — ZOLEDRONIC ACID 4 MG/100ML IV SOLN
4.0000 mg | Freq: Once | INTRAVENOUS | Status: AC
  Administered 2022-10-16: 4 mg via INTRAVENOUS
  Filled 2022-10-16: qty 100

## 2022-10-16 NOTE — Assessment & Plan Note (Signed)
Recommend B12 IM injection monthly

## 2022-10-16 NOTE — Assessment & Plan Note (Addendum)
#  Osteoporosis. Recommend calcium and vitamin D supplementation.   Proceed with Zometa 4 mg every 6 months.

## 2022-10-16 NOTE — Progress Notes (Signed)
Hematology/Oncology Progress note [phone] [phone]    Chief Complaint: Ashley Ryan is a 77 y.o. female presents for follow up of left breast DCIS s/p mastectomy and B12 deficiency.  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 finished 5 years, recommend patient to stop.  Continue annual mammogram  Osteoporosis #Osteoporosis. Recommend calcium and vitamin D supplementation.   Proceed with Zometa 4 mg every 6 months.    Iron deficiency anemia Labs are reviewed and discussed with patient. Lab Results  Component Value Date   HGB 10.4 (L) 10/16/2022   TIBC 343 10/16/2022   IRONPCTSAT 10 (L) 10/16/2022   FERRITIN 19 10/16/2022    She has been on Vitron C daily and iron saturation is persistently low.  Recommend IV Venofer weekly x 2.  I discussed about the potential risks including but not limited to allergic reactions/infusion reactions including anaphylactic reactions, phlebitis, high blood pressure, wheezing, SOB, skin rash, weight gain,dark urine, leg swelling, back pain, headache, nausea and fatigue, etc. Patient agrees with the plan.     B12 deficiency Recommend B12 IM injection monthly    Orders Placed This Encounter  Procedures   MM 3D SCREENING MAMMOGRAM UNILATERAL RIGHT BREAST    Standing Status:   Future    Standing Expiration Date:   10/16/2023    Order Specific Question:   Reason for Exam (SYMPTOM  OR DIAGNOSIS REQUIRED)    Answer:   hx breast cancer, annual screening    Order Specific Question:   Preferred imaging location?    Answer:   [facility]   Basic Metabolic Panel - Cancer Center Only    Standing Status:   Future    Standing Expiration Date:   10/16/2023   CBC with Differential (Cancer Center Only)    Standing Status:   Future    Standing Expiration Date:   10/16/2023   CMP (Cancer Center only)     Standing Status:   Future    Standing Expiration Date:   10/16/2023   CBC (Cancer Center Only)    Standing Status:   Future    Standing Expiration Date:   10/16/2023   Follow up per LOS  All questions were answered. The patient knows to call the clinic with any problems, questions or concerns.  Rickard Patience, MD, PhD [facility] Hematology Oncology 10/16/2022   PERTINENT ONCOLOGY HISTORY Ashley Ryan is a 77 y.o.afemale who has above oncology history reviewed by me today presented for follow up visit for management of  left breast DCIS s/p mastectomy and B12 deficiency.  Patient previously followed up by Dr.Corcoran, patient switched care to me on 09/07/20 Extensive medical record review was performed by me  06/13/2017.  left breast DCIS s/p simple mastectomy on  Pathology revealed at least 8 mm of grade III DCIS.  Margins were negative.  One sentinel lymph node was negative.  DCIS was ER was + (> 90%) and PR + (75%).  Pathologic stage was Tis N0.   07/03/2017. tamoxifen   # Bone health.  Osteopenia 08/04/2019 Bone density revealed osteopenia with a T score of -2.1 in the right femoral neck.    # iron deficiency anemia.  history of blood transfusion  EGD on 09/09/2013 revealed Cameron erosions, esophageal stricture s/p Maloney dilatation, and a large sliding hiatal hernia. EGD on 06/04/2019 revealed a medium-sized hiatal hernia, gastritis, and one  duodenal polyp (peptic duodenitis).  Pathology revealed iron pill gastritis and no H pylori, metaplasia, dysplasia or malignancy.  Colonoscopy on 09/09/2013 revealed an 8 mm sessile polyp in the cecum (tubular adenoma).  Colonoscopy on 06/04/2019 revealed two 6 to 8 mm polyps in the transverse colon (tubular adenomas), and one 6 mm polyp in the rectum (tubular adenoma).  Clip (MR conditional) was placed.  There was one 3 mm polyp in the ascending colon (tubular adenoma).  There were non-bleeding internal hemorrhoids and diverticulosis in the  sigmoid colon.   09/07/2021 unilateral right screening mammogram showed possible asymmetry.  Asymmetry did not persist on additional views   INTERVAL HISTORY Ashley Ryan is a 77 y.o. female who has above history reviewed by me today presents for follow up visit for management of history of DCIS, osteoporosis. Patient reports feeling well.  She has no new concerns.  No breast concerns.    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  Mastectomy/ SLN.   Chronic headache    Complication of anesthesia    prior to 1991 used to have PONV.  none recently.   Diverticulosis of colon    Family history of adverse reaction to anesthesia    mother/daighter get sick   GERD with stricture    Hard of hearing    Heart murmur    followed by PCP   Hiatal hernia    Hypertension    Neuropathy    feet   Osteoporosis    Pre-diabetes    Wears dentures    full upper    Past Surgical History:  Procedure Laterality Date   ABDOMINAL HYSTERECTOMY  1978   one ovary remains   BREAST BIOPSY Left 05/26/2017   Affirm Bx- coil clip Ductal carcinoma in situ, high nuclear grade with calcifications and focal comedonecrosis   CARDIAC CATHETERIZATION     "yrs ago" all OK.   CATARACT EXTRACTION W/PHACO Left 02/21/2016   Procedure: CATARACT EXTRACTION PHACO AND INTRAOCULAR LENS PLACEMENT (IOC)  Left eye;  Surgeon: Lockie Mola, MD;  Location: [facility];  Service: Ophthalmology;  Laterality: Left;  Left eye Diabetic   CATARACT EXTRACTION W/PHACO Right 03/13/2016   Procedure: CATARACT EXTRACTION PHACO AND INTRAOCULAR LENS PLACEMENT (IOC)  right  diabetic;  Surgeon: Lockie Mola, MD;  Location: [facility];  Service: Ophthalmology;  Laterality: Right;  diabetic   CHOLECYSTECTOMY  1978   COLONOSCOPY W/ POLYPECTOMY  09/09/2013   8 mm tubular adenoma of the cecum.  Melvia Heaps, MD.  [place] [facility]   COLONOSCOPY WITH PROPOFOL N/A 06/04/2019   Procedure: COLONOSCOPY WITH PROPOFOL;  Surgeon: Midge Minium, MD;  Location: [facility] ENDOSCOPY;  Service: Endoscopy;  Laterality: N/A;   ESOPHAGOGASTRODUODENOSCOPY (EGD) WITH PROPOFOL N/A 06/04/2019   Procedure: ESOPHAGOGASTRODUODENOSCOPY (EGD) WITH PROPOFOL;  Surgeon: Midge Minium, MD;  Location: [facility] ENDOSCOPY;  Service: Endoscopy;  Laterality: N/A;   JOINT REPLACEMENT Left    knee    LEFT HEART CATH AND CORONARY ANGIOGRAPHY N/A 11/06/2017   Procedure: LEFT HEART CATH AND CORONARY ANGIOGRAPHY;  Surgeon: Yvonne Kendall, MD;  Location: [facility] INVASIVE CV LAB;  Service: Cardiovascular;  Laterality: N/A;   MASTECTOMY Left 06/13/2017   mastectomy neg margins   OOPHORECTOMY     one still left   RENAL ANGIOGRAPHY  11/06/2017   Procedure: RENAL ANGIOGRAPHY;  Surgeon: Yvonne Kendall, MD;  Location: [facility]  INVASIVE CV LAB;  Service: Cardiovascular;;   SENTINEL NODE BIOPSY Left 06/13/2017   Procedure: SENTINEL NODE BIOPSY;  Surgeon: Earline Mayotte, MD;  Location: [facility] ORS;  Service: General;  Laterality: Left;   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  Surgeon: Earline Mayotte, MD;  Location: [facility] ORS;  Service: General;  Laterality: Left;    Family History  Problem Relation Age of Onset   Breast cancer Other    Stomach cancer Other    Diabetes Other    Heart disease Other    Breast cancer Sister 67   Esophageal cancer Brother    Hypertension Mother    Heart attack Mother 6   Hypertension Father    Coronary artery disease Father 24       CABG   Stroke Father     Social History:  reports that she has never smoked. She has never used smokeless tobacco. She reports that she does not drink alcohol and does not use drugs. Her husband's name is Freight forwarder.  She is a retired Lawyer.  She lives in [place]. The patient is alone today.   Allergies:  Allergies   Allergen Reactions   Oxycodone Nausea Only   Sulfa Antibiotics Rash    Childhood reaction   Sulfonamide Derivatives Rash    Childhood reaction.    Current Medications: Current Outpatient Medications  Medication Sig Dispense Refill   Alcohol Swabs (DROPSAFE ALCOHOL PREP) 70 % PADS USE  TO TEST BLOOD SUGAR ONE TIME DAILY 100 each 3   amLODipine (NORVASC) 5 MG tablet Take 1 tablet (5 mg total) by mouth daily. 90 tablet 3   atorvastatin (LIPITOR) 40 MG tablet TAKE 1 TABLET AT BEDTIME 90 tablet 3   Blood Glucose Calibration (ACCU-CHEK AVIVA) SOLN Use to check blood sugar once daily 1 each 0   Blood Glucose Calibration (TRUE METRIX LEVEL 1) Low SOLN USE AS DIRECTED 1 each 3   Blood Glucose Monitoring Suppl (TRUE METRIX AIR GLUCOSE METER) w/Device KIT Use to check blood sugar daily 1 kit 0   brimonidine (ALPHAGAN) 0.2 % ophthalmic solution brimonidine 0.2 % eye drops  INSTILL 1 DROP INTO BOTH EYES TWICE A DAY     Calcium Carb-Cholecalciferol (CALCIUM 600 + D) 600-5 MG-MCG TABS Take 2 tablets by mouth daily. 180 tablet 2   diclofenac (VOLTAREN) 75 MG EC tablet Take 1 tablet (75 mg total) by mouth 2 (two) times daily. 60 tablet 1   diclofenac Sodium (VOLTAREN) 1 % GEL APPLY 2 GRAMS TOPICALLY TO THE AFFECTED AREA 4 TIMES DAILY. 700 g 11   esomeprazole (NEXIUM) 40 MG capsule TAKE 1 CAPSULE TWICE DAILY BEFORE MEALS 180 capsule 3   fluticasone (FLONASE) 50 MCG/ACT nasal spray USE 2 SPRAYS INTO THE NOSE DAILY AS DIRECTED (SUBSTITUTED FOR FLONASE) 48 g 3   furosemide (LASIX) 20 MG tablet TAKE 1 TABLET BY MOUTH EVERY DAY AS NEEDED 30 tablet 5   gabapentin (NEURONTIN) 100 MG capsule TAKE 1 CAPSULE EVERY MORNING , 1 CAPSULE AT LUNCH, 1 CAPSULE AT DINNER, AND 2-3 CAPSULES AT BEDTIME 540 capsule 3   Glucosamine 500 MG TABS Take 500 mg by mouth daily.     glucose blood (TRUE METRIX BLOOD GLUCOSE TEST) test strip Use to check blood sugar daily 100 each 3   Iron-Vitamin C 65-125 MG TABS Take 1 tablet by mouth  daily at 2 PM. 30 tablet 5  isosorbide mononitrate (IMDUR) 30 MG 24 hr tablet TAKE 1 TABLET EVERY DAY 90 tablet 3   latanoprost (XALATAN) 0.005 % ophthalmic solution Place 1 drop into both eyes at bedtime.     lidocaine (LIDODERM) 5 % APPLY 1 PATCH ONTO THE SKIN EVERY DAY. REMOVE AND DISCARD PATCH WITHIN 12 HOURS OR AS DIRECTED BY PHYSICIAN 90 patch 3   losartan-hydrochlorothiazide (HYZAAR) 100-25 MG tablet Take 1 tablet by mouth daily.     Omega-3 Fatty Acids (FISH OIL) 1000 MG CAPS Take 1,000 mg by mouth daily.      polycarbophil (FIBERCON) 625 MG tablet Take 625 mg by mouth daily.     potassium chloride SA (KLOR-CON M20) 20 MEQ tablet Take 1 tablet (20 mEq total) by mouth 2 (two) times daily. 180 tablet 3   solifenacin (VESICARE) 5 MG tablet Take 1 tablet (5 mg total) by mouth daily. 30 tablet 11   traMADol (ULTRAM) 50 MG tablet Take 1 tablet (50 mg total) by mouth every 6 (six) hours as needed. for pain 90 tablet 0   triamcinolone cream (KENALOG) 0.5 % Apply 1 application topically 2 (two) times daily. 30 g 0   TRUEplus Lancets 28G MISC TEST BLOOD SUGAR EVERY DAY 100 each 3   cetirizine (ZYRTEC) 10 MG tablet Take 10 mg by mouth daily.     No current facility-administered medications for this visit.   Review of Systems  Constitutional:  Negative for appetite change, chills, fatigue and fever.  HENT:   Negative for hearing loss and voice change.   Eyes:  Negative for eye problems.  Respiratory:  Negative for chest tightness and cough.   Cardiovascular:  Negative for chest pain.  Gastrointestinal:  Negative for abdominal distention, abdominal pain and blood in stool.  Endocrine: Negative for hot flashes.  Genitourinary:  Negative for difficulty urinating and frequency.   Musculoskeletal:  Positive for arthralgias and back pain.  Skin:  Negative for itching and rash.  Neurological:  Negative for extremity weakness.  Hematological:  Negative for adenopathy.  Psychiatric/Behavioral:   Negative for confusion.      Performance status (ECOG): 1  Vital Signs Blood pressure 133/73, pulse 65, temperature 98.7 F (37.1 C), resp. rate 18, weight 118 lb (53.5 kg).  Physical Exam Constitutional:      General: She is not in acute distress.    Appearance: She is not diaphoretic.     Comments: Thin, ambulates independantly  HENT:     Head: Normocephalic and atraumatic.     Nose: Nose normal.     Mouth/Throat:     Pharynx: No oropharyngeal exudate.  Eyes:     General: No scleral icterus.    Pupils: Pupils are equal, round, and reactive to light.  Cardiovascular:     Rate and Rhythm: Normal rate.  Pulmonary:     Effort: Pulmonary effort is normal. No respiratory distress.  Chest:     Chest wall: No tenderness.  Abdominal:     General: There is no distension.     Palpations: Abdomen is soft.     Tenderness: There is no abdominal tenderness.  Musculoskeletal:        General: Normal range of motion.     Cervical back: Normal range of motion and neck supple.  Skin:    General: Skin is warm.     Findings: No erythema.  Neurological:     Mental Status: She is alert and oriented to person, place, and time.     Cranial  Nerves: No cranial nerve deficit.     Motor: No abnormal muscle tone.  Psychiatric:        Mood and Affect: Mood and affect normal.     Laboratory studies.     Latest Ref Rng & Units 10/16/2022    2:37 PM 03/15/2022   10:05 AM 09/10/2021    2:12 PM  CBC  WBC 4.0 - 10.5 K/uL 7.2  6.4  5.6   Hemoglobin 12.0 - 15.0 g/dL 86.5  9.8  78.4   Hematocrit 36.0 - 46.0 % 34.5  32.1  35.8   Platelets 150 - 400 K/uL 151  176  189        Latest Ref Rng & Units 10/16/2022    2:37 PM 06/18/2022    1:32 PM 05/30/2022   10:19 AM  CMP  Glucose 70 - 99 mg/dL 93  99  94   BUN 8 - 23 mg/dL 17  17  10    Creatinine 0.44 - 1.00 mg/dL 6.96  2.95  2.84   Sodium 135 - 145 mmol/L 137  139  139   Potassium 3.5 - 5.1 mmol/L 3.1  4.3  3.2   Chloride 98 - 111 mmol/L 107   110  106   CO2 22 - 32 mmol/L 23  22  25    Calcium 8.9 - 10.3 mg/dL 9.1  8.9  8.5   Total Protein 6.5 - 8.1 g/dL 6.9     Total Bilirubin 0.3 - 1.2 mg/dL 0.3     Alkaline Phos 38 - 126 U/L 41     AST 15 - 41 U/L 15     ALT 0 - 44 U/L 11

## 2022-10-16 NOTE — Assessment & Plan Note (Addendum)
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 finished 5 years, recommend patient to stop.  Continue annual mammogram

## 2022-10-16 NOTE — Patient Instructions (Signed)

## 2022-10-16 NOTE — Assessment & Plan Note (Addendum)
Labs are reviewed and discussed with patient. Lab Results  Component Value Date   HGB 10.4 (L) 10/16/2022   TIBC 343 10/16/2022   IRONPCTSAT 10 (L) 10/16/2022   FERRITIN 19 10/16/2022    She has been on Vitron C daily and iron saturation is persistently low.  Recommend IV Venofer weekly x 2.  I discussed about the potential risks including but not limited to allergic reactions/infusion reactions including anaphylactic reactions, phlebitis, high blood pressure, wheezing, SOB, skin rash, weight gain,dark urine, leg swelling, back pain, headache, nausea and fatigue, etc. Patient agrees with the plan.

## 2022-10-17 ENCOUNTER — Telehealth: Payer: Self-pay

## 2022-10-17 NOTE — Telephone Encounter (Signed)
Pt informed of MD recommendation and verbalized understanding.   Please schedule:  Venofer weekly x2  B12 inj monthly until follow up (ok to start first dose when she is her for first iron infusion)

## 2022-10-17 NOTE — Telephone Encounter (Signed)
-----   Message from Rickard Patience sent at 10/16/2022  8:26 PM EDT ----- Please let patient know that her iron remains low. I recommend IV venofer weekly x2. She may hold off oral iron when she gets IV iron, and resume oral iron after that.  B12 is borderline low, recommend her to get B12 injections monthly.  If she is on oral b12 supplementation, she may stop.  Keep same f/u appt

## 2022-10-18 ENCOUNTER — Telehealth: Payer: Self-pay | Admitting: Family Medicine

## 2022-10-18 MED ORDER — FLUOXETINE HCL 10 MG PO CAPS: 10.0000 mg | ORAL_CAPSULE | Freq: Every day | ORAL | 11 refills | Status: DC

## 2022-10-18 NOTE — Telephone Encounter (Signed)
Patient contacted the office, states she is no longer taking her medication Tamoxifen, and states that she had to discontinue taking prozac while taking the other medication. PT wanted to know if Dr. Ermalene Searing could restart her prozac at a low dose?

## 2022-10-23 ENCOUNTER — Inpatient Hospital Stay: Payer: Medicare PPO | Attending: Oncology

## 2022-10-23 VITALS — BP 140/64 | HR 67 | Temp 97.9°F | Resp 16

## 2022-10-23 DIAGNOSIS — D509 Iron deficiency anemia, unspecified: Secondary | ICD-10-CM | POA: Insufficient documentation

## 2022-10-23 DIAGNOSIS — E538 Deficiency of other specified B group vitamins: Secondary | ICD-10-CM | POA: Insufficient documentation

## 2022-10-23 DIAGNOSIS — Z79810 Long term (current) use of selective estrogen receptor modulators (SERMs): Secondary | ICD-10-CM | POA: Diagnosis not present

## 2022-10-23 DIAGNOSIS — D0512 Intraductal carcinoma in situ of left breast: Secondary | ICD-10-CM | POA: Insufficient documentation

## 2022-10-23 MED ORDER — SODIUM CHLORIDE 0.9 % IV SOLN
Freq: Once | INTRAVENOUS | Status: AC
  Filled 2022-10-23: qty 250

## 2022-10-23 MED ORDER — CYANOCOBALAMIN 1000 MCG/ML IJ SOLN
1000.0000 ug | Freq: Once | INTRAMUSCULAR | Status: AC
  Administered 2022-10-23: 1000 ug via INTRAMUSCULAR
  Filled 2022-10-23: qty 1

## 2022-10-23 MED ORDER — SODIUM CHLORIDE 0.9 % IV SOLN
200.0000 mg | Freq: Once | INTRAVENOUS | Status: AC
  Administered 2022-10-23: 200 mg via INTRAVENOUS
  Filled 2022-10-23: qty 200

## 2022-10-23 NOTE — Progress Notes (Signed)
Patient tolerated Venofer infusion well. Explained recommendation of 30 min post monitoring. Patient refused to wait post monitoring. Educated on what signs to watch for & to call with any concerns. No questions, discharged. Stable  

## 2022-10-23 NOTE — Patient Instructions (Signed)
Iron Sucrose Injection What is this medication? IRON SUCROSE (EYE ern SOO krose) treats low levels of iron (iron deficiency anemia) in people with kidney disease. Iron is a mineral that plays an important role in making red blood cells, which carry oxygen from your lungs to the rest of your body. This medicine may be used for other purposes; ask your health care provider or pharmacist if you have questions. COMMON BRAND NAME(S): Venofer What should I tell my care team before I take this medication? They need to know if you have any of these conditions: Anemia not caused by low iron levels Heart disease High levels of iron in the blood Kidney disease Liver disease An unusual or allergic reaction to iron, other medications, foods, dyes, or preservatives Pregnant or trying to get pregnant Breastfeeding How should I use this medication? This medication is for infusion into a vein. It is given in a hospital or clinic setting. Talk to your care team about the use of this medication in children. While this medication may be prescribed for children as young as 2 years for selected conditions, precautions do apply. Overdosage: If you think you have taken too much of this medicine contact a poison control center or emergency room at once. NOTE: This medicine is only for you. Do not share this medicine with others. What if I miss a dose? Keep appointments for follow-up doses. It is important not to miss your dose. Call your care team if you are unable to keep an appointment. What may interact with this medication? Do not take this medication with any of the following: Deferoxamine Dimercaprol Other iron products This medication may also interact with the following: Chloramphenicol Deferasirox This list may not describe all possible interactions. Give your health care provider a list of all the medicines, herbs, non-prescription drugs, or dietary supplements you use. Also tell them if you smoke,  drink alcohol, or use illegal drugs. Some items may interact with your medicine. What should I watch for while using this medication? Visit your care team regularly. Tell your care team if your symptoms do not start to get better or if they get worse. You may need blood work done while you are taking this medication. You may need to follow a special diet. Talk to your care team. Foods that contain iron include: whole grains/cereals, dried fruits, beans, or peas, leafy green vegetables, and organ meats (liver, kidney). What side effects may I notice from receiving this medication? Side effects that you should report to your care team as soon as possible: Allergic reactions--skin rash, itching, hives, swelling of the face, lips, tongue, or throat Low blood pressure--dizziness, feeling faint or lightheaded, blurry vision Shortness of breath Side effects that usually do not require medical attention (report to your care team if they continue or are bothersome): Flushing Headache Joint pain Muscle pain Nausea Pain, redness, or irritation at injection site This list may not describe all possible side effects. Call your doctor for medical advice about side effects. You may report side effects to FDA at 1-800-FDA-1088. Where should I keep my medication? This medication is given in a hospital or clinic. It will not be stored at home. NOTE: This sheet is a summary. It may not cover all possible information. If you have questions about this medicine, talk to your doctor, pharmacist, or health care provider.  2024 Elsevier/Gold Standard (2022-06-14 00:00:00)

## 2022-10-30 ENCOUNTER — Inpatient Hospital Stay: Payer: Medicare PPO

## 2022-10-30 VITALS — BP 153/87 | HR 77 | Temp 96.5°F | Resp 17

## 2022-10-30 DIAGNOSIS — D0512 Intraductal carcinoma in situ of left breast: Secondary | ICD-10-CM | POA: Diagnosis not present

## 2022-10-30 DIAGNOSIS — E538 Deficiency of other specified B group vitamins: Secondary | ICD-10-CM | POA: Diagnosis not present

## 2022-10-30 DIAGNOSIS — Z79810 Long term (current) use of selective estrogen receptor modulators (SERMs): Secondary | ICD-10-CM | POA: Diagnosis not present

## 2022-10-30 DIAGNOSIS — D509 Iron deficiency anemia, unspecified: Secondary | ICD-10-CM | POA: Diagnosis not present

## 2022-10-30 MED ORDER — SODIUM CHLORIDE 0.9 % IV SOLN
Freq: Once | INTRAVENOUS | Status: AC
  Filled 2022-10-30: qty 250

## 2022-10-30 MED ORDER — SODIUM CHLORIDE 0.9 % IV SOLN
200.0000 mg | Freq: Once | INTRAVENOUS | Status: AC
  Administered 2022-10-30: 200 mg via INTRAVENOUS
  Filled 2022-10-30: qty 200

## 2022-11-06 ENCOUNTER — Other Ambulatory Visit: Payer: Self-pay | Admitting: Family Medicine

## 2022-11-10 ENCOUNTER — Other Ambulatory Visit: Payer: Self-pay | Admitting: Family Medicine

## 2022-11-25 ENCOUNTER — Inpatient Hospital Stay: Payer: Medicare PPO | Attending: Oncology

## 2022-11-27 ENCOUNTER — Other Ambulatory Visit: Payer: Self-pay | Admitting: Family Medicine

## 2022-11-27 ENCOUNTER — Other Ambulatory Visit: Payer: Self-pay | Admitting: Internal Medicine

## 2022-11-27 NOTE — Progress Notes (Deleted)
  Cardiology Office Note:    Date:  11/27/2022  ID:  Ashley Ryan, DOB February 16, 1945, MRN [id number] PCP: [name], MD  [facility] Providers Cardiologist:  Yvonne Kendall, MD { Click to update primary MD,subspecialty MD or APP then REFRESH:1}    {Click to Open Review  :1}   Patient Profile:      HFpEF Pulmonary hypertension Labile hypertension Hyperlipidemia Mild mitral regurgitation Prediabetes Anemia Hiatal hernia      History of Present Illness:  Discussed the use of AI scribe software for clinical note transcription with the patient, who gave verbal consent to proceed.  Ashley Ryan is a 77 y.o. female who returns for ***  Cardiac catheterization performed 11/06/2017 with no significant CAD, mildly elevated left ventricular filling pressures.  Recommended continue isosorbide for possible vasospasm/microvascular dysfunction, restart Lasix 20 mg daily.  Echocardiogram 11/06/2017 LVEF 50-60%, grade 1 DD, RVSP 35-40 mmHg  She was seen on 10/12/2021, at the time she has noted multiple falls due to orthostatic lightheadedness.  Her isosorbide mononitrate was decreased to 15 mg.  Antiplatelet therapy was deferred due to frequent falls over the last several months.  Echocardiogram ordered and completed 11/15/2021 showing LVEF 60-65%, no RWMA, mild LVH, grade 1 DD, RV SF normal, mild mitral valve regurgitation.  She was seen on 02/28/2022.  At the time she had had several falls leading to fractures associated with dizziness.  Of note her dizziness began around the time she was transition from fluoxetine to citalopram.  Her blood pressure at home and in clinic remained elevated, amlodipine 5 mg was added to her medication regimen.  She was last seen by Dr. Okey Dupre on 05/30/2022, where she noted episodes of transient generalized weakness that she associated with blood pressure drops.  She noted a blood pressure of "50/60".  Her blood pressure was borderline elevated, however due to  hypotensive episodes her blood pressure regimen did not change.  History of Present Illness            ROS   See HPI ***    Studies Reviewed:       *** Risk Assessment/Calculations:   {Does this patient have ATRIAL FIBRILLATION?[phone]} No BP recorded.  {Refresh Note OR Click here to enter BP  :1}***       Physical Exam:   VS:  There were no vitals taken for this visit.   Wt Readings from Last 3 Encounters:  10/16/22 118 lb (53.5 kg)  10/15/22 119 lb (54 kg)  10/10/22 119 lb 2 oz (54 kg)    Physical Exam***     Assessment and Plan:  Coronary artery disease  Chronic HFpEF  Labile hypertension  Hyperlipidemia  Hypokalemia              {Are you ordering a CV Procedure (e.g. stress test, cath, DCCV, TEE, etc)?   Press F2        :[id number]}  Dispo:  No follow-ups on file.  Signed, Denyce Robert, NP

## 2022-11-27 NOTE — Telephone Encounter (Signed)
Last office visit 10/10/2022 with Dr. Patsy Lager for Knee Pain.  Last refilled 08/19/2022 for #60 with 1 refill.  Next Appt: No future appointments with PCP.

## 2022-11-28 ENCOUNTER — Encounter: Payer: Self-pay | Admitting: Nurse Practitioner

## 2022-11-28 ENCOUNTER — Ambulatory Visit: Payer: Medicare PPO | Admitting: Nurse Practitioner

## 2022-12-05 ENCOUNTER — Ambulatory Visit: Payer: Medicare PPO | Admitting: Internal Medicine

## 2022-12-05 ENCOUNTER — Ambulatory Visit: Payer: Medicare PPO | Attending: Medical | Admitting: Medical

## 2022-12-05 ENCOUNTER — Encounter: Payer: Self-pay | Admitting: Medical

## 2022-12-05 VITALS — BP 130/78 | HR 80 | Ht 59.0 in | Wt 119.0 lb

## 2022-12-05 DIAGNOSIS — I5032 Chronic diastolic (congestive) heart failure: Secondary | ICD-10-CM | POA: Diagnosis not present

## 2022-12-05 DIAGNOSIS — R0989 Other specified symptoms and signs involving the circulatory and respiratory systems: Secondary | ICD-10-CM

## 2022-12-05 DIAGNOSIS — Z79899 Other long term (current) drug therapy: Secondary | ICD-10-CM

## 2022-12-05 DIAGNOSIS — I340 Nonrheumatic mitral (valve) insufficiency: Secondary | ICD-10-CM | POA: Diagnosis not present

## 2022-12-05 DIAGNOSIS — I25118 Atherosclerotic heart disease of native coronary artery with other forms of angina pectoris: Secondary | ICD-10-CM

## 2022-12-05 NOTE — Progress Notes (Signed)
Cardiology Office Note:    Date:  12/05/2022   ID:  Ashley Ryan, DOB [birth year], MRN [id number]  PCP:  [name], MD  [facility] Cardiologist:  Yvonne Kendall, MD  [facility] Electrophysiologist:  None   Referring MD: [name], MD   Chief Complaint: 6 month follow-up  History of Present Illness:    Ashley Ryan is a 77 y.o. female with a hx of HFpEF, mild pulmonary hypertension, systemic hypertension, hiatal hernia, prediabetes, anemia, and arthritis who presents for follow-up.  Left heart cath in 2019 showed no significant CAD, mildly elevated LV filling pressures consistent with diastolic dysfunction, normal bilateral renal arteries.  Echo in 2023 showed EF of 60 to 65%, mild LVH, grade 1 diastolic dysfunction, mild MR.  Patient was last seen in May 2024 complaining of arthritis in her hands.  She reported low blood pressures at home however blood pressure at the visit was mildly elevated.  No changes were made.  Today, the patient reports occasional low blood pressures. Said SBP 118 and she felt bad. BP this afternoon is good 130/78. She denies chest pain, SOB, LLE, lightheadedness or dizziness. She still does home health three days a week. She takes lasix as needed for swelling. She has minimal lower leg edema on exam.   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  Mastectomy/ SLN.   Chest pain    a. 10/2017 Cath: Nl cors.   Chronic headache    Chronic heart failure with preserved ejection fraction (HFpEF) (HCC)    a. 10/2021 Echo: EF 60-65%, no rwma, mild LVH, GrI DD, nl RV size/fxn, mild MR.   Complication of anesthesia    prior to 1991 used to have PONV.  none recently.   Diverticulosis of colon    Family history of adverse reaction to anesthesia    mother/daighter get sick   GERD with stricture    Hard of hearing    Hiatal hernia    Hypokalemia     Labile hypertension    a. 10/2017 Cath: nl renal arteries.   Mitral regurgitation    a. 10/2021 Echo: Mild MR.   Mixed hyperlipidemia    Neuropathy    feet   Osteoporosis    Pre-diabetes    Pulmonary hypertension (HCC)    a. 10/2017 Echo: PASP 35-67mmHg.   Wears dentures    full upper    Past Surgical History:  Procedure Laterality Date   ABDOMINAL HYSTERECTOMY  1978   one ovary remains   BREAST BIOPSY Left 05/26/2017   Affirm Bx- coil clip Ductal carcinoma in situ, high nuclear grade with calcifications and focal comedonecrosis   CARDIAC CATHETERIZATION     "yrs ago" all OK.   CATARACT EXTRACTION W/PHACO Left 02/21/2016   Procedure: CATARACT EXTRACTION PHACO AND INTRAOCULAR LENS PLACEMENT (IOC)  Left eye;  Surgeon: Lockie Mola, MD;  Location: [facility];  Service: Ophthalmology;  Laterality: Left;  Left eye Diabetic   CATARACT EXTRACTION W/PHACO Right 03/13/2016   Procedure: CATARACT EXTRACTION PHACO AND INTRAOCULAR LENS PLACEMENT (IOC)  right  diabetic;  Surgeon: Lockie Mola, MD;  Location: [facility];  Service: Ophthalmology;  Laterality: Right;  diabetic   CHOLECYSTECTOMY  1978   COLONOSCOPY W/ POLYPECTOMY  09/09/2013   8 mm tubular adenoma of the cecum.  Melvia Heaps, MD. [facility]  COLONOSCOPY WITH PROPOFOL N/A 06/04/2019   Procedure: COLONOSCOPY WITH PROPOFOL;  Surgeon: Midge Minium, MD;  Location: [facility];  Service: Endoscopy;  Laterality: N/A;   ESOPHAGOGASTRODUODENOSCOPY (EGD) WITH PROPOFOL N/A 06/04/2019   Procedure: ESOPHAGOGASTRODUODENOSCOPY (EGD) WITH PROPOFOL;  Surgeon: Midge Minium, MD;  Location: [facility];  Service: Endoscopy;  Laterality: N/A;   JOINT REPLACEMENT Left    knee    LEFT HEART CATH AND CORONARY ANGIOGRAPHY N/A 11/06/2017   Procedure: LEFT HEART CATH AND CORONARY ANGIOGRAPHY;  Surgeon: Yvonne Kendall, MD;  Location: [facility] IN[facility] CV [facility];  Service: Cardiovascular;  Laterality: N/A;   MASTECTOMY  Left 06/13/2017   mastectomy neg margins   OOPHORECTOMY     one still left   RENAL ANGIOGRAPHY  11/06/2017   Procedure: RENAL ANGIOGRAPHY;  Surgeon: Yvonne Kendall, MD;  Location: [facility] CV [facility];  Service: Cardiovascular;;   SENTINEL NODE BIOPSY Left 06/13/2017   Procedure: SENTINEL NODE BIOPSY;  Surgeon: Earline Mayotte, MD;  Location: [facility] ORS;  Service: General;  Laterality: Left;   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  Surgeon: Earline Mayotte, MD;  Location: [facility] OR[facility];  Service: General;  Laterality: Left;    Current Medications: No outpatient medications have been marked as taking for the 12/05/22 encounter (Appointment) with Fransico Michael, Aaisha Sliter H, PA-C.     Allergies:   Oxycodone, Sulfa antibiotics, and Sulfonamide derivatives   Social History   Socioeconomic History   Marital status: Married    Spouse name: Not on file   Number of children: 2   Years of education: Not on file   Highest education level: Not on file  Occupational History   Occupation: retired cna    Employer: RETIRED  Tobacco Use   Smoking status: Never   Smokeless tobacco: Never  Vaping Use   Vaping status: Never Used  Substance and Sexual Activity   Alcohol use: No   Drug use: No   Sexual activity: Not Currently  Other Topics Concern   Not on file  Social History Narrative   Drinks sweet tea, diet sodas 4 a day   No living will, full code (reviewed 2014)   Social Determinants of Health   Financial Resource Strain: Low Risk  (10/15/2022)   Overall Financial Resource Strain (CARDIA)    Difficulty of Paying Living Expenses: Not hard at all  Food Insecurity: No Food Insecurity (10/15/2022)   Hunger Vital Sign    Worried About Running Out of Food in the Last Year: Never true    Ran Out of Food in the Last Year: Never true  Transportation Needs: Unmet Transportation Needs (10/15/2022)   PRAPARE - Transportation     Lack of Transportation (Medical): Yes    Lack of Transportation (Non-Medical): Yes  Physical Activity: Inactive (10/15/2022)   Exercise Vital Sign    Days of Exercise per Week: 0 days    Minutes of Exercise per Session: 0 min  Stress: No Stress Concern Present (10/15/2022)   [employer] of Occupational Health - Occupational Stress Questionnaire    Feeling of Stress : Not at all  Social Connections: Moderately Isolated (10/15/2022)   Social Connection and Isolation Panel [NHANES]    Frequency of Communication with Friends and Family: More than three times a week    Frequency of Social Gatherings with Friends and Family: Once a week    Attends Religious Services: Never    Production manager of [employer]  or Organizations: No    Attends Banker Meetings: Never    Marital Status: Married     Family History: The patient's family history includes Breast cancer in an other family member; Breast cancer (age of onset: 21) in her sister; Coronary artery disease (age of onset: 7) in her father; Diabetes in an other family member; Esophageal cancer in her brother; Heart attack (age of onset: 31) in her mother; Heart disease in an other family member; Hypertension in her father and mother; Stomach cancer in an other family member; Stroke in her father.  ROS:   Please see the history of present illness.     All other systems reviewed and are negative.  EKGs/Labs/Other Studies Reviewed:    The following studies were reviewed today:  Echo 2023 1. Left ventricular ejection fraction, by estimation, is 60 to 65%. The  left ventricle has normal function. The left ventricle has no regional  wall motion abnormalities. There is mild left ventricular hypertrophy.  Left ventricular diastolic parameters  are consistent with Grade I diastolic dysfunction (impaired relaxation).   2. Right ventricular systolic function is normal. The right ventricular  size is normal. Tricuspid regurgitation signal is  inadequate for assessing  PA pressure.   3. The mitral valve is normal in structure. Mild mitral valve  regurgitation. No evidence of mitral stenosis.   4. The aortic valve is tricuspid. Aortic valve regurgitation is not  visualized. No aortic stenosis is present.   5. The inferior vena cava is normal in size with greater than 50%  respiratory variability, suggesting right atrial pressure of 3 mmHg.   [facility] 2019 Conclusions: No angiographically significant coronary artery disease. Mildly elevated left ventricular filling pressure, consistent with diastolic dysfunction (normal LVEF by echo yesterday). Normal bilateral renal arteries.   Recommendations: Continue isosorbide mononitrate for possible component of microvascular dysfunction. Consider evaluation for non-cardiac etiologies of chest pain, including GI workup. Restart furosemide 20 mg daily tomorrow. Ok for discharge home this afternoon if no bleeding or vascular injury at right femoral arteriotomy site.  Patient should follow-up in our office in ~2 weeks.   No indication for antiplatelet therapy at this time.    Yvonne Kendall, MD [facility] Pager: [phone]  Echo 2019 Study Conclusions   - Left ventricle: The cavity size was normal. Wall thickness was    increased in a pattern of moderate LVH. Systolic function was    normal. The estimated ejection fraction was in the range of 55%    to 60%. Wall motion was normal; there were no regional wall    motion abnormalities. Doppler parameters are consistent with    abnormal left ventricular relaxation (grade 1 diastolic    dysfunction).  - Aortic valve: There was trivial regurgitation.  - Left atrium: The atrium was mildly dilated.  - Right ventricle: The cavity size was normal. Systolic function    was normal.  - Pulmonary arteries: Systolic pressure was mildly increased, in    the range of 35 mm Hg to 40 mm Hg.    EKG:  EKG is ordered today.  The ekg ordered  today demonstrates NSR 80bpm, nonspecific T wave changes  Recent Labs: 10/16/2022: ALT 11; BUN 17; Creatinine 0.79; Hemoglobin 10.4; Platelet Count 151; Potassium 3.1; Sodium 137  Recent Lipid Panel    Component Value Date/Time   CHOL 125 02/12/2022 0736   TRIG 104.0 02/12/2022 0736   HDL 59.80 02/12/2022 0736   CHOLHDL 2 02/12/2022 0736  VLDL 20.8 02/12/2022 0736   LDLCALC 44 02/12/2022 0736   LDLDIRECT 102.5 07/21/2009 0829    Physical Exam:    VS:  There were no vitals taken for this visit.    Wt Readings from Last 3 Encounters:  10/16/22 118 lb (53.5 kg)  10/15/22 119 lb (54 kg)  10/10/22 119 lb 2 oz (54 kg)     GEN:  Well nourished, well developed in no acute distress HEENT: Normal NECK: No JVD; No carotid bruits LYMPHATICS: No lymphadenopathy CARDIAC: RRR, no murmurs, rubs, gallops RESPIRATORY:  Clear to auscultation without rales, wheezing or rhonchi  ABDOMEN: Soft, non-tender, non-distended MUSCULOSKELETAL:  1+ pedal edema; No deformity  SKIN: Warm and dry NEUROLOGIC:  Alert and oriented x 3 PSYCHIATRIC:  Normal affect   ASSESSMENT:    No diagnosis found. PLAN:    In order of problems listed above:  CAD The patient denies anginal symptoms. Continue Imdur, Lipitor and amlodipine. No further ischemic work-up at this time.   HTN BP today is good. She says sometimes the SBP drops (which is essentially normal). I recommended she stagger her medications. Continue amlodipine 5mg  daily, Imdur 30mg  daily, Losartan -hydrochlorothiazide 100-25mg  daily.   Hypokalemia Chronic low potassium followed by Dr. Cathie Hoops. She takes potassium supplements.   HFpEF The patient has 1+ pedal edema on exam. She only takes lasix as needed. I recommended she take lasix 20mg  for 2 days. She is already on potassium supplementation. Continue Losartan-HCTZ  HLD LDL well controlled. Continue Lipitor 40mg  daily.   Disposition: Follow up in 6 month(s) with MD/APP   Signed, Merriam Brandner David Stall, PA-C  12/05/2022 8:02 AM    [facility]

## 2022-12-05 NOTE — Patient Instructions (Signed)
Medication Instructions:  The current medical regimen is effective;  continue present plan and medications.  *If you need a refill on your cardiac medications before your next appointment, please call your pharmacy*   Follow-Up: At [facility], you and your health needs are our priority.  As part of our continuing mission to provide you with exceptional heart care, we have created designated Provider Care Teams.  These Care Teams include your primary Cardiologist (physician) and Advanced Practice Providers (APPs -  Physician Assistants and Nurse Practitioners) who all work together to provide you with the care you need, when you need it.  We recommend signing up for the patient portal called "MyChart".  Sign up information is provided on this After Visit Summary.  MyChart is used to connect with patients for Virtual Visits (Telemedicine).  Patients are able to view lab/test results, encounter notes, upcoming appointments, etc.  Non-urgent messages can be sent to your provider as well.   To learn more about what you can do with MyChart, go to [email or web address].    Your next appointment:   6 month(s)  Provider:   You may see Yvonne Kendall, MD or one of the following Advanced Practice Providers on your designated Care Team:   Nicolasa Ducking, NP Eula Listen, PA-C Cadence Fransico Michael, PA-C Charlsie Quest, NP Carlos Levering, NP

## 2022-12-10 DIAGNOSIS — Z0100 Encounter for examination of eyes and vision without abnormal findings: Secondary | ICD-10-CM | POA: Diagnosis not present

## 2022-12-10 DIAGNOSIS — H353132 Nonexudative age-related macular degeneration, bilateral, intermediate dry stage: Secondary | ICD-10-CM | POA: Diagnosis not present

## 2022-12-10 DIAGNOSIS — H18513 Endothelial corneal dystrophy, bilateral: Secondary | ICD-10-CM | POA: Diagnosis not present

## 2022-12-10 DIAGNOSIS — H401133 Primary open-angle glaucoma, bilateral, severe stage: Secondary | ICD-10-CM | POA: Diagnosis not present

## 2022-12-12 ENCOUNTER — Other Ambulatory Visit: Payer: Self-pay | Admitting: Family Medicine

## 2022-12-12 NOTE — Telephone Encounter (Signed)
Last office visit 10/10/2022 with Dr. Patsy Lager for knee pain/fall.  Last refilled 02/26/2022 for #90 with 3 refills.  No future appointments with PCP.

## 2022-12-25 ENCOUNTER — Inpatient Hospital Stay: Payer: Medicare PPO

## 2022-12-26 ENCOUNTER — Inpatient Hospital Stay: Payer: Medicare PPO | Attending: Oncology

## 2022-12-26 DIAGNOSIS — D0512 Intraductal carcinoma in situ of left breast: Secondary | ICD-10-CM | POA: Insufficient documentation

## 2022-12-26 DIAGNOSIS — E538 Deficiency of other specified B group vitamins: Secondary | ICD-10-CM | POA: Diagnosis not present

## 2022-12-26 DIAGNOSIS — D509 Iron deficiency anemia, unspecified: Secondary | ICD-10-CM | POA: Diagnosis not present

## 2022-12-26 MED ORDER — CYANOCOBALAMIN 1000 MCG/ML IJ SOLN
1000.0000 ug | Freq: Once | INTRAMUSCULAR | Status: AC
  Administered 2022-12-26: 1000 ug via INTRAMUSCULAR
  Filled 2022-12-26: qty 1

## 2023-01-01 ENCOUNTER — Other Ambulatory Visit: Payer: Self-pay | Admitting: Family Medicine

## 2023-01-01 NOTE — Telephone Encounter (Signed)
LVM for patient to c/b and schedule.  

## 2023-01-01 NOTE — Telephone Encounter (Signed)
Please schedule CPE with fasting labs prior for around the first of Feb.

## 2023-01-18 ENCOUNTER — Other Ambulatory Visit: Payer: Self-pay | Admitting: Family Medicine

## 2023-01-20 NOTE — Telephone Encounter (Signed)
Please schedule CPE with fasting labs prior with Dr. Ermalene Searing after 02/20/2023.

## 2023-01-20 NOTE — Telephone Encounter (Signed)
Patient has been scheduled

## 2023-01-27 ENCOUNTER — Inpatient Hospital Stay: Payer: Medicare PPO

## 2023-01-30 ENCOUNTER — Inpatient Hospital Stay: Payer: Medicare PPO | Attending: Oncology

## 2023-01-30 ENCOUNTER — Telehealth: Payer: Self-pay | Admitting: *Deleted

## 2023-01-30 DIAGNOSIS — E1140 Type 2 diabetes mellitus with diabetic neuropathy, unspecified: Secondary | ICD-10-CM

## 2023-01-30 DIAGNOSIS — E785 Hyperlipidemia, unspecified: Secondary | ICD-10-CM

## 2023-01-30 DIAGNOSIS — E538 Deficiency of other specified B group vitamins: Secondary | ICD-10-CM

## 2023-01-30 DIAGNOSIS — E1169 Type 2 diabetes mellitus with other specified complication: Secondary | ICD-10-CM

## 2023-01-30 MED ORDER — CYANOCOBALAMIN 1000 MCG/ML IJ SOLN
1000.0000 ug | Freq: Once | INTRAMUSCULAR | Status: AC
Start: 2023-01-30 — End: ?
  Filled 2023-01-30: qty 1

## 2023-01-30 NOTE — Telephone Encounter (Signed)
-----   Message from Alvina Chou sent at 01/30/2023  2:44 PM EST ----- Regarding: Lab orders for Tue, 1.28.25 Patient is scheduled for CPX labs, please order future labs, Thanks , Camelia Eng

## 2023-02-06 ENCOUNTER — Other Ambulatory Visit: Payer: Self-pay | Admitting: Internal Medicine

## 2023-02-06 ENCOUNTER — Other Ambulatory Visit: Payer: Self-pay | Admitting: Family Medicine

## 2023-02-06 NOTE — Telephone Encounter (Signed)
Last office visit 10/10/2022 with Dr. Patsy Lager for fall and knee pain.  Last refilled 11/27/22 for #60 with 1 refill.  Next Appt: CPE 02/25/2023.

## 2023-02-18 ENCOUNTER — Other Ambulatory Visit (INDEPENDENT_AMBULATORY_CARE_PROVIDER_SITE_OTHER): Payer: Medicare PPO

## 2023-02-18 ENCOUNTER — Encounter: Payer: Self-pay | Admitting: Family Medicine

## 2023-02-18 DIAGNOSIS — E1140 Type 2 diabetes mellitus with diabetic neuropathy, unspecified: Secondary | ICD-10-CM

## 2023-02-18 DIAGNOSIS — E1169 Type 2 diabetes mellitus with other specified complication: Secondary | ICD-10-CM

## 2023-02-18 DIAGNOSIS — E785 Hyperlipidemia, unspecified: Secondary | ICD-10-CM | POA: Diagnosis not present

## 2023-02-18 LAB — COMPREHENSIVE METABOLIC PANEL WITH GFR
ALT: 12 U/L (ref 0–35)
AST: 14 U/L (ref 0–37)
Albumin: 4 g/dL (ref 3.5–5.2)
Alkaline Phosphatase: 57 U/L (ref 39–117)
BUN: 11 mg/dL (ref 6–23)
CO2: 30 meq/L (ref 19–32)
Calcium: 9.1 mg/dL (ref 8.4–10.5)
Chloride: 105 meq/L (ref 96–112)
Creatinine, Ser: 0.56 mg/dL (ref 0.40–1.20)
GFR: 86.61 mL/min
Glucose, Bld: 79 mg/dL (ref 70–99)
Potassium: 4.1 meq/L (ref 3.5–5.1)
Sodium: 140 meq/L (ref 135–145)
Total Bilirubin: 0.4 mg/dL (ref 0.2–1.2)
Total Protein: 6.1 g/dL (ref 6.0–8.3)

## 2023-02-18 LAB — MICROALBUMIN / CREATININE URINE RATIO
Creatinine,U: 35.1 mg/dL
Microalb Creat Ratio: 2 mg/g (ref 0.0–30.0)
Microalb, Ur: <0.7 mg/dL (ref 0.0–1.9)

## 2023-02-18 LAB — LIPID PANEL
Cholesterol: 150 mg/dL (ref 0–200)
HDL: 55.4 mg/dL
LDL Cholesterol: 77 mg/dL (ref 0–99)
NonHDL: 94.5
Total CHOL/HDL Ratio: 3
Triglycerides: 87 mg/dL (ref 0.0–149.0)
VLDL: 17.4 mg/dL (ref 0.0–40.0)

## 2023-02-18 LAB — HEMOGLOBIN A1C: Hgb A1c MFr Bld: 6 % (ref 4.6–6.5)

## 2023-02-18 NOTE — Progress Notes (Signed)
No critical labs need to be addressed urgently. We will discuss labs in detail at upcoming office visit.

## 2023-02-22 ENCOUNTER — Other Ambulatory Visit: Payer: Self-pay | Admitting: Internal Medicine

## 2023-02-25 ENCOUNTER — Encounter: Payer: Medicare PPO | Admitting: Family Medicine

## 2023-02-25 ENCOUNTER — Ambulatory Visit: Payer: Medicare PPO

## 2023-02-25 NOTE — Progress Notes (Signed)
 Siena Poehler T. Schyler Butikofer, MD, CAQ Sports Medicine [facility] [address] [place], [address]  Phone: [phone]  FAX: [phone]  Ashley Ryan - 78 y.o. female  MRN [id number]  Date of Birth: 12-17-1945  Date: 02/26/2023  PCP: Avelina Greig BRAVO, MD  Referral: Avelina Greig BRAVO, MD  Chief Complaint  Patient presents with   Knee Pain    Right   Shoulder Pain    Right    Subjective:   Ashley Ryan is a 78 y.o. very pleasant female patient with Body mass index is 25.07 kg/m. who presents with the following:  Patient presents for follow-up of chronic right-sided knee osteoarthritis, and also intermittent shoulder pain.  She is basically just here for procedure visits. GEN: No acute distress; alert,appropriate. PULM: Breathing comfortably in no respiratory distress PSYCH: Normally interactive.   Laboratory and Imaging Data:  Assessment and Plan:     ICD-10-CM   1. Primary osteoarthritis of right knee  M17.11 triamcinolone  acetonide (KENALOG -40) injection 40 mg    2. Chronic right shoulder pain  M25.511 triamcinolone  acetonide (KENALOG -40) injection 40 mg   G89.29      Aspiration/Injection Procedure Note Ashley Ryan 07/18/1945 Date of procedure: 02/26/2023  Procedure: Large Joint Aspiration / Injection of Knee, R Indications: Pain  Procedure Details Patient verbally consented to procedure. Risks, benefits, and alternatives explained. Sterilely prepped with Chloraprep. Ethyl cholride used for anesthesia. 9 cc Lidocaine  1% mixed with 1 mL of Kenalog  40 mg injected using the anteromedial approach without difficulty. No complications with procedure and tolerated well. Patient had decreased pain post-injection. Medication: 1 mL of Kenalog  40 mg   Intraarticular Shoulder Aspiration/Injection Procedure Note Ashley Ryan [birth year] Date of procedure: 02/26/2023  Procedure: Large Joint Aspiration / Injection of Shoulder,  Intraarticular, R Indications: Pain  Procedure Details Verbal consent was obtained from the patient. Risks explained and contrasted with benefits and alternatives. Patient prepped with Chloraprep and Ethyl Chloride used for anesthesia. An intraarticular shoulder injection was performed using the posterior approach; needle placed into joint capsule without difficulty. The patient tolerated the procedure well and had decreased pain post injection. No complications. Injection: 9 cc of Lidocaine  1% and 1 mL Kenalog  40 mg. Needle: 21 gauge, 2 inch   Medication Management during today's office visit: Meds ordered this encounter  Medications   triamcinolone  acetonide (KENALOG -40) injection 40 mg   triamcinolone  acetonide (KENALOG -40) injection 40 mg   There are no discontinued medications.  Orders placed today for conditions managed today: No orders of the defined types were placed in this encounter.   Disposition: No follow-ups on file.  [employer] One speech-to-text software was used for transcription in this dictation.  Possible transcriptional errors can occur using Animal nutritionist.   Signed,  Jacques DASEN. Louellen Haldeman, MD   Outpatient Encounter Medications as of 02/26/2023  Medication Sig   Alcohol Swabs (DROPSAFE ALCOHOL PREP) 70 % PADS USE  TO TEST BLOOD SUGAR ONE TIME DAILY   amLODipine  (NORVASC ) 5 MG tablet TAKE 1 TABLET (5 MG TOTAL) BY MOUTH DAILY.   atorvastatin  (LIPITOR) 40 MG tablet TAKE 1 TABLET AT BEDTIME   Blood Glucose Calibration (ACCU-CHEK AVIVA) SOLN Use to check blood sugar once daily   Blood Glucose Calibration (TRUE METRIX LEVEL 1) Low SOLN USE AS DIRECTED   Blood Glucose Monitoring Suppl (TRUE METRIX AIR GLUCOSE METER) w/Device KIT Use to check blood sugar daily   brimonidine  (ALPHAGAN ) 0.2 % ophthalmic solution brimonidine   0.2 % eye drops  INSTILL 1 DROP INTO BOTH EYES TWICE A DAY   Calcium  Carb-Cholecalciferol  (CALCIUM  600 + D) 600-5 MG-MCG TABS Take 2 tablets by  mouth daily.   cetirizine (ZYRTEC) 10 MG tablet Take 10 mg by mouth daily.   diclofenac  (VOLTAREN ) 75 MG EC tablet TAKE 1 TABLET BY MOUTH TWICE A DAY   diclofenac  Sodium (VOLTAREN ) 1 % GEL APPLY 2 GRAMS TOPICALLY TO THE AFFECTED AREA 4 TIMES DAILY.   esomeprazole  (NEXIUM ) 40 MG capsule TAKE 1 CAPSULE TWICE DAILY BEFORE MEALS   FLUoxetine  (PROZAC ) 10 MG capsule TAKE 1 CAPSULE BY MOUTH EVERY DAY   fluticasone  (FLONASE ) 50 MCG/ACT nasal spray USE 2 SPRAYS INTO THE NOSE DAILY AS DIRECTED (SUBSTITUTED FOR FLONASE )   furosemide  (LASIX ) 20 MG tablet TAKE 1 TABLET BY MOUTH EVERY DAY AS NEEDED   gabapentin  (NEURONTIN ) 100 MG capsule TAKE 1 CAPSULE EVERY MORNING , 1 CAPSULE AT LUNCH, 1 CAPSULE AT DINNER, AND 2-3 CAPSULES AT BEDTIME   Glucosamine 500 MG TABS Take 500 mg by mouth daily.   glucose blood (TRUE METRIX BLOOD GLUCOSE TEST) test strip Use to check blood sugar daily   Iron -Vitamin C  65-125 MG TABS Take 1 tablet by mouth daily at 2 PM.   isosorbide  mononitrate (IMDUR ) 30 MG 24 hr tablet TAKE 1 TABLET EVERY DAY   latanoprost  (XALATAN ) 0.005 % ophthalmic solution Place 1 drop into both eyes at bedtime.   lidocaine  (LIDODERM ) 5 % APPLY 1 PATCH ONTO THE SKIN EVERY DAY. REMOVE AND DISCARD PATCH WITHIN 12 HOURS OR AS DIRECTED BY PHYSICIAN   losartan -hydrochlorothiazide  (HYZAAR) 100-25 MG tablet Take 1 tablet by mouth daily.   Omega-3 Fatty Acids (FISH OIL) 1000 MG CAPS Take 1,000 mg by mouth daily.    polycarbophil (FIBERCON) 625 MG tablet Take 625 mg by mouth daily.   potassium chloride  SA (KLOR-CON  M20) 20 MEQ tablet TAKE 1 TABLET BY MOUTH TWICE A DAY   solifenacin  (VESICARE ) 5 MG tablet Take 1 tablet (5 mg total) by mouth daily.   traMADol  (ULTRAM ) 50 MG tablet Take 1 tablet (50 mg total) by mouth every 6 (six) hours as needed. for pain   triamcinolone  cream (KENALOG ) 0.5 % Apply 1 application topically 2 (two) times daily.   TRUEplus Lancets 28G MISC TEST BLOOD SUGAR EVERY DAY    Facility-Administered Encounter Medications as of 02/26/2023  Medication   cyanocobalamin  (VITAMIN B12) injection 1,000 mcg   [COMPLETED] triamcinolone  acetonide (KENALOG -40) injection 40 mg   [COMPLETED] triamcinolone  acetonide (KENALOG -40) injection 40 mg

## 2023-02-26 ENCOUNTER — Ambulatory Visit: Payer: Medicare PPO | Admitting: Family Medicine

## 2023-02-26 VITALS — BP 150/90 | HR 80 | Temp 98.3°F | Ht 59.0 in | Wt 124.1 lb

## 2023-02-26 DIAGNOSIS — M1711 Unilateral primary osteoarthritis, right knee: Secondary | ICD-10-CM

## 2023-02-26 DIAGNOSIS — M25511 Pain in right shoulder: Secondary | ICD-10-CM

## 2023-02-26 DIAGNOSIS — G8929 Other chronic pain: Secondary | ICD-10-CM | POA: Diagnosis not present

## 2023-02-26 MED ORDER — TRIAMCINOLONE ACETONIDE 40 MG/ML IJ SUSP
40.0000 mg | Freq: Once | INTRAMUSCULAR | Status: AC
  Administered 2023-02-26: 40 mg via INTRA_ARTICULAR

## 2023-02-27 ENCOUNTER — Encounter: Payer: Self-pay | Admitting: Family Medicine

## 2023-02-27 ENCOUNTER — Inpatient Hospital Stay: Payer: Medicare PPO

## 2023-03-03 ENCOUNTER — Inpatient Hospital Stay: Payer: Medicare PPO | Attending: Oncology

## 2023-03-03 ENCOUNTER — Inpatient Hospital Stay: Payer: Medicare PPO

## 2023-03-03 DIAGNOSIS — E538 Deficiency of other specified B group vitamins: Secondary | ICD-10-CM | POA: Diagnosis not present

## 2023-03-03 MED ORDER — CYANOCOBALAMIN 1000 MCG/ML IJ SOLN
1000.0000 ug | Freq: Once | INTRAMUSCULAR | Status: AC
  Administered 2023-03-03: 1000 ug via INTRAMUSCULAR
  Filled 2023-03-03: qty 1

## 2023-03-14 ENCOUNTER — Ambulatory Visit: Payer: Medicare PPO | Admitting: Family Medicine

## 2023-03-15 ENCOUNTER — Other Ambulatory Visit: Payer: Self-pay | Admitting: Family Medicine

## 2023-03-19 ENCOUNTER — Encounter: Payer: Self-pay | Admitting: Family Medicine

## 2023-03-19 ENCOUNTER — Ambulatory Visit (INDEPENDENT_AMBULATORY_CARE_PROVIDER_SITE_OTHER): Payer: Medicare PPO | Admitting: Family Medicine

## 2023-03-19 VITALS — BP 138/70 | HR 92 | Temp 98.5°F | Ht 59.0 in | Wt 121.1 lb

## 2023-03-19 DIAGNOSIS — I5032 Chronic diastolic (congestive) heart failure: Secondary | ICD-10-CM | POA: Diagnosis not present

## 2023-03-19 DIAGNOSIS — I272 Pulmonary hypertension, unspecified: Secondary | ICD-10-CM | POA: Diagnosis not present

## 2023-03-19 DIAGNOSIS — E1140 Type 2 diabetes mellitus with diabetic neuropathy, unspecified: Secondary | ICD-10-CM | POA: Diagnosis not present

## 2023-03-19 DIAGNOSIS — M79675 Pain in left toe(s): Secondary | ICD-10-CM

## 2023-03-19 DIAGNOSIS — N8110 Cystocele, unspecified: Secondary | ICD-10-CM

## 2023-03-19 DIAGNOSIS — Z0000 Encounter for general adult medical examination without abnormal findings: Secondary | ICD-10-CM

## 2023-03-19 DIAGNOSIS — N3281 Overactive bladder: Secondary | ICD-10-CM

## 2023-03-19 DIAGNOSIS — D509 Iron deficiency anemia, unspecified: Secondary | ICD-10-CM

## 2023-03-19 DIAGNOSIS — E538 Deficiency of other specified B group vitamins: Secondary | ICD-10-CM | POA: Diagnosis not present

## 2023-03-19 DIAGNOSIS — E785 Hyperlipidemia, unspecified: Secondary | ICD-10-CM | POA: Diagnosis not present

## 2023-03-19 DIAGNOSIS — E1142 Type 2 diabetes mellitus with diabetic polyneuropathy: Secondary | ICD-10-CM

## 2023-03-19 DIAGNOSIS — F330 Major depressive disorder, recurrent, mild: Secondary | ICD-10-CM

## 2023-03-19 MED ORDER — MIRABEGRON ER 25 MG PO TB24: 25.0000 mg | ORAL_TABLET | Freq: Every day | ORAL | 11 refills | Status: DC

## 2023-03-19 NOTE — Assessment & Plan Note (Signed)
 Stable, chronic.  Continue current medication.     Euvolemic. Lasix 20 mg prn.

## 2023-03-19 NOTE — Progress Notes (Signed)
 The   Patient ID: Ashley Ryan, female    DOB: [birth year], 78 y.o.   MRN: [id number]  This visit was conducted in person.  BP 138/70 (BP Location: Right Arm, Patient Position: Sitting, Cuff Size: Normal)   Pulse 92   Temp 98.5 F (36.9 C) (Temporal)   Ht 4\' 11"  (1.499 m)   Wt 121 lb 2 oz (54.9 kg)   SpO2 95%   BMI 24.46 kg/m    CC:  Chief Complaint  Patient presents with   Annual Exam    Part 2    Subjective:   HPI: Ashley Ryan is a 78 y.o. female presenting on 03/19/2023 for Annual Exam (Part 2)  The patient presents for complete physical and review of chronic health problems. She also has the following acute concerns today: OAB symptoms... has continued to note vaginal prolapse   In last few days great toe pain.  The patient saw a LPN or RN for medicare wellness visit.  10/15/2022  Prevention and wellness was reviewed in detail. Note reviewed and important notes copied below.  Dr. 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 Cathie Hoops  She has recently been seeing Dr. Patsy Lager sports medicine for her osteoarthritis in her right shoulder and right  knee... She is status post steroid injection February 26, 2023 Uses tramadol 50 mg 1 tablet daily Often takes tylenol as well.  Diabetes: Chronic, stable diet controlled. Lab Results  Component Value Date   HGBA1C 6.0 02/18/2023  Using medications without difficulties: Hypoglycemic episodes: Hyperglycemic episodes: Feet problems:none, has peripheral neuropathy Blood Sugars averaging: Not checking eye exam within last year: Yes  High cholesterol: LDL at goal less than 70 given coronary artery disease on atorvastatin 40 mg daily Lab Results  Component Value Date   CHOL 150 02/18/2023   HDL 55.40 02/18/2023   LDLCALC 77 02/18/2023   LDLDIRECT 102.5 07/21/2009    TRIG 87.0 02/18/2023   CHOLHDL 3 02/18/2023   No exercise  Diet: moderate     Hypertension: Well-controlled on current regimen of amlodipine 5 mg daily, Imdur 30 mg daily and losartan hydrochlorothiazide 100/25 mg p.o. daily BP Readings from Last 3 Encounters:  03/19/23 138/70  02/26/23 (!) 150/90  12/05/22 130/78  Using medication without problems or lightheadedness:  none Chest pain with exertion: none Edema:  intermittent Short of breath: none Average home BPs:  < 140/90 Other issues:  HFpEF stable, CAD, mild mitral valve regurgitation, labile hypertension, pulmonary hypertension: Dr End follows.  Euvolemic in office using Lasix  20 mg as needed. Reviewed last office visit from December 05, 2022  MDD, in remission. She had SE to meds in past.. does not want to take any med. Some increase in stress with husband with leukemia.  No longer on przac as giving headaches. Flowsheet Row Office Visit from 03/19/2023 in [facility] Total Score 0         OAB.Marland Kitchen  wears pad at night for incontinence... has urgency during the day, not severe frequency.  Detrol LA 4 mg helped minimally.. some dry mouth. Mirbegron interacts with tamoxifen.... but she stopped this several months ago  vesicare ... Insurance would not pay for this   Iron def anemia and B12 def.. followed by Dr. Cathie Hoops on iron infusions.  Relevant  past medical, surgical, family and social history reviewed and updated as indicated. Interim medical history since our last visit reviewed. Allergies and medications reviewed and updated. Outpatient Medications Prior to Visit  Medication Sig Dispense Refill   Alcohol Swabs (DROPSAFE ALCOHOL PREP) 70 % PADS USE  TO TEST BLOOD SUGAR ONE TIME DAILY 100 each 3   amLODipine (NORVASC) 5 MG tablet TAKE 1 TABLET (5 MG TOTAL) BY MOUTH DAILY. 90 tablet 3   atorvastatin (LIPITOR) 40 MG tablet TAKE 1 TABLET AT BEDTIME 90 tablet 0   Blood Glucose  Calibration (ACCU-CHEK AVIVA) SOLN Use to check blood sugar once daily 1 each 0   Blood Glucose Calibration (TRUE METRIX LEVEL 1) Low SOLN USE AS DIRECTED 1 each 3   Blood Glucose Monitoring Suppl (TRUE METRIX AIR GLUCOSE METER) w/Device KIT Use to check blood sugar daily 1 kit 0   brimonidine (ALPHAGAN) 0.2 % ophthalmic solution brimonidine 0.2 % eye drops  INSTILL 1 DROP INTO BOTH EYES TWICE A DAY     Calcium Carb-Cholecalciferol (CALCIUM 600 + D) 600-5 MG-MCG TABS Take 2 tablets by mouth daily. 180 tablet 2   cetirizine (ZYRTEC) 10 MG tablet Take 10 mg by mouth daily.     diclofenac (VOLTAREN) 75 MG EC tablet TAKE 1 TABLET BY MOUTH TWICE A DAY 60 tablet 1   diclofenac Sodium (VOLTAREN) 1 % GEL APPLY 2 GRAMS TOPICALLY TO THE AFFECTED AREA 4 TIMES DAILY. 700 g 11   esomeprazole (NEXIUM) 40 MG capsule TAKE 1 CAPSULE TWICE DAILY BEFORE MEALS 180 capsule 0   fluticasone (FLONASE) 50 MCG/ACT nasal spray USE 2 SPRAYS INTO THE NOSE DAILY AS DIRECTED (SUBSTITUTED FOR FLONASE) 48 g 0   furosemide (LASIX) 20 MG tablet TAKE 1 TABLET BY MOUTH EVERY DAY AS NEEDED 30 tablet 5   gabapentin (NEURONTIN) 100 MG capsule TAKE 1 CAPSULE EVERY MORNING , 1 CAPSULE AT LUNCH, 1 CAPSULE AT DINNER, AND 2-3 CAPSULES AT BEDTIME 540 capsule 3   Glucosamine 500 MG TABS Take 500 mg by mouth daily.     glucose blood (TRUE METRIX BLOOD GLUCOSE TEST) test strip Use to check blood sugar daily 100 each 3   Iron-Vitamin C 65-125 MG TABS Take 1 tablet by mouth daily at 2 PM. 30 tablet 5   isosorbide mononitrate (IMDUR) 30 MG 24 hr tablet TAKE 1 TABLET EVERY DAY 90 tablet 0   latanoprost (XALATAN) 0.005 % ophthalmic solution Place 1 drop into both eyes at bedtime.     lidocaine (LIDODERM) 5 % APPLY 1 PATCH ONTO THE SKIN EVERY DAY. REMOVE AND DISCARD PATCH WITHIN 12 HOURS OR AS DIRECTED BY PHYSICIAN 90 patch 3   losartan-hydrochlorothiazide (HYZAAR) 100-25 MG tablet Take 1 tablet by mouth daily.     Omega-3 Fatty Acids (FISH OIL) 1000  MG CAPS Take 1,000 mg by mouth daily.      polycarbophil (FIBERCON) 625 MG tablet Take 625 mg by mouth daily.     potassium chloride SA (KLOR-CON M20) 20 MEQ tablet TAKE 1 TABLET BY MOUTH TWICE A DAY 180 tablet 2   traMADol (ULTRAM) 50 MG tablet Take 1 tablet (50 mg total) by mouth every 6 (six) hours as needed. for pain 90 tablet 0   triamcinolone cream (KENALOG) 0.5 % Apply 1 application topically 2 (two) times daily. 30 g 0   TRUEplus Lancets 28G MISC TEST BLOOD SUGAR EVERY DAY 100 each 3   solifenacin (VESICARE) 5 MG tablet Take 1 tablet (5 mg total) by  mouth daily. 30 tablet 11   FLUoxetine (PROZAC) 10 MG capsule TAKE 1 CAPSULE BY MOUTH EVERY DAY (Patient not taking: Reported on 03/19/2023) 90 capsule 3   Facility-Administered Medications Prior to Visit  Medication Dose Route Frequency Provider Last Rate Last Admin   cyanocobalamin (VITAMIN B12) injection 1,000 mcg  1,000 mcg Intramuscular Once Rickard Patience, MD         Per HPI unless specifically indicated in ROS section below Review of Systems  Constitutional:  Negative for fatigue and fever.  HENT:  Negative for congestion.   Eyes:  Negative for pain.  Respiratory:  Negative for cough and shortness of breath.   Cardiovascular:  Negative for chest pain, palpitations and leg swelling.  Gastrointestinal:  Negative for abdominal pain.  Genitourinary:  Negative for dysuria and vaginal bleeding.  Musculoskeletal:  Negative for back pain.  Neurological:  Negative for syncope, light-headedness and headaches.  Psychiatric/Behavioral:  Negative for dysphoric mood.     Objective:  BP 138/70 (BP Location: Right Arm, Patient Position: Sitting, Cuff Size: Normal)   Pulse 92   Temp 98.5 F (36.9 C) (Temporal)   Ht 4\' 11"  (1.499 m)   Wt 121 lb 2 oz (54.9 kg)   SpO2 95%   BMI 24.46 kg/m   Wt Readings from Last 3 Encounters:  03/19/23 121 lb 2 oz (54.9 kg)  02/26/23 124 lb 2 oz (56.3 kg)  12/05/22 119 lb (54 kg)      Physical Exam Vitals  and nursing note reviewed.  Constitutional:      General: She is not in acute distress.    Appearance: Normal appearance. She is well-developed. She is not ill-appearing or toxic-appearing.  HENT:     Head: Normocephalic.     Right Ear: Hearing, tympanic membrane, ear canal and external ear normal.     Left Ear: Hearing, tympanic membrane, ear canal and external ear normal.     Nose: Nose normal.  Eyes:     General: Lids are normal. Lids are everted, no foreign bodies appreciated.     Conjunctiva/sclera: Conjunctivae normal.     Pupils: Pupils are equal, round, and reactive to light.  Neck:     Thyroid: No thyroid mass or thyromegaly.     Vascular: No carotid bruit.     Trachea: Trachea normal.  Cardiovascular:     Rate and Rhythm: Normal rate and regular rhythm.     Heart sounds: Normal heart sounds, S1 normal and S2 normal. No murmur heard.    No gallop.  Pulmonary:     Effort: Pulmonary effort is normal. No respiratory distress.     Breath sounds: Normal breath sounds. No wheezing, rhonchi or rales.  Abdominal:     General: Bowel sounds are normal. There is no distension or abdominal bruit.     Palpations: Abdomen is soft. There is no fluid wave or mass.     Tenderness: There is no abdominal tenderness. There is no guarding or rebound.     Hernia: No hernia is present.  Musculoskeletal:     Cervical back: Normal range of motion and neck supple.  Lymphadenopathy:     Cervical: No cervical adenopathy.  Skin:    General: Skin is warm and dry.     Findings: No rash.  Neurological:     Mental Status: She is alert.     Cranial Nerves: No cranial nerve deficit.     Sensory: No sensory deficit.  Psychiatric:  Mood and Affect: Mood is not anxious or depressed.        Speech: Speech normal.        Behavior: Behavior normal. Behavior is cooperative.        Judgment: Judgment normal.    Diabetic foot exam: Abnormal inspection  skin breakdown at great toe.. see photo   Pre ulcerative calluses  Normal DP pulses Decreaed sensation to light touch and monofilament        Results for orders placed or performed in visit on 02/18/23  Microalbumin / creatinine urine ratio   Collection Time: 02/18/23  8:26 AM  Result Value Ref Range   Microalb, Ur <0.7 0.0 - 1.9 mg/dL   Creatinine,U 96.0 mg/dL   Microalb Creat Ratio 2.0 0.0 - 30.0 mg/g  Hemoglobin A1c   Collection Time: 02/18/23  8:26 AM  Result Value Ref Range   Hgb A1c MFr Bld 6.0 4.6 - 6.5 %  Lipid panel   Collection Time: 02/18/23  8:26 AM  Result Value Ref Range   Cholesterol 150 0 - 200 mg/dL   Triglycerides 45.4 0.0 - 149.0 mg/dL   HDL 09.81 >19.14 mg/dL   VLDL 78.2 0.0 - 95.6 mg/dL   LDL Cholesterol 77 0 - 99 mg/dL   Total CHOL/HDL Ratio 3    NonHDL 94.50   Comprehensive metabolic panel   Collection Time: 02/18/23  8:26 AM  Result Value Ref Range   Sodium 140 135 - 145 mEq/L   Potassium 4.1 3.5 - 5.1 mEq/L   Chloride 105 96 - 112 mEq/L   CO2 30 19 - 32 mEq/L   Glucose, Bld 79 70 - 99 mg/dL   BUN 11 6 - 23 mg/dL   Creatinine, Ser 2.13 0.40 - 1.20 mg/dL   Total Bilirubin 0.4 0.2 - 1.2 mg/dL   Alkaline Phosphatase 57 39 - 117 U/L   AST 14 0 - 37 U/L   ALT 12 0 - 35 U/L   Total Protein 6.1 6.0 - 8.3 g/dL   Albumin 4.0 3.5 - 5.2 g/dL   GFR 08.65 >78.46 mL/min   Calcium 9.1 8.4 - 10.5 mg/dL    Assessment and Plan The patient's preventative maintenance and recommended screening tests for an annual wellness exam were reviewed in full today. Brought up to date unless services declined.  Counselled on the importance of diet, exercise, and its role in overall health and mortality. The patient's FH and SH was reviewed, including their home life, tobacco status, and drug and alcohol status.   Vaccines: uptodate with COVID x 2, encouraged booster. Uptodate with flu shot today. Consider shingrix.  DEXA: Has stomach ulcer 11/2013 on PPI nexium. SE to fosamax 11/2014:  osteoprosis Started  Boniva as of 10/2015, now off.   osteoporosis  Improved to osteopenia 2019,  2023 per Dr Cathie Hoops. Colon: 05/2019,  Dr. Hamilton Capri, repeat  5 year. Mammo: breast cancer, left, s/p left mastectomy. 08/2021 right asymmetry, resolved on diagnostic, nml 08/2022 PAP/DVE: partial hysterectomy, no pap smear or DVE required,. No family history of ovarian cancer.    Hep C: neg   Routine general medical examination at a health care facility  Controlled type 2 diabetes with neuropathy [facility]) Assessment & Plan: Chronic, diet controlled. Associated with neuropathy.  Orders: -     Ambulatory referral to Podiatry  Diabetic peripheral neuropathy (HCC) Assessment & Plan: Associated with diabetes.  Well-controlled with current gabapentin regimen..   Hyperlipidemia LDL goal <70 Assessment & Plan: Chronic, LDL  almost at goal less than 70 given coronary artery disease   Compliant with atorvastatin 40 mg daily   B12 deficiency Assessment & Plan: Followed by oncology.   MDD (major depressive disorder), mild (HCC) Assessment & Plan: Chronic, in remission She states this is not an issue for her and does not feel she was really on the SSRI for this.   Chronic heart failure with preserved ejection fraction (HCC) Assessment & Plan: Stable, chronic.  Continue current medication.     Euvolemic. Lasix 20 mg prn.     OAB (overactive bladder) Assessment & Plan:  Chronic   Detrol LA 4 mg helped minimally.. some dry mouth.Alvester Morin interacts with tamoxifen.... but she stopped this several months ago  vesicare ... Insurance would not pay for this   Will try mirbegron now off tamoxifem.    Pulmonary hypertension (HCC) Assessment & Plan: Followed by cardiology.   Iron deficiency anemia, unspecified iron deficiency anemia type Assessment & Plan: Chronic,   Followed by Heme.   Pain of left great toe Assessment & Plan: Acute, on exam it is evident that the rod placed in her great toe has  protruded out of the skin chronically over time.  This is now causing her pain.  There is some associated redness but no clear infection.  Given she is a diabetic she is at high risk for cellulitis.  Will refer urgently to podiatry for further evaluation and treatment.  Orders: -     Ambulatory referral to Podiatry  Vaginal prolapse Assessment & Plan: Acute, she is able to push the vaginal tissue back up and it stays for a while.  She denies any vaginal irritation or soreness.  No discharge.  She does have overactive bladder and stress incontinence symptoms.  No recurrent UTI. We discussed possible vaginal exam and consideration of pessary/GYN referral.  She will let me know if this continues to bother her or worsens and we will consider moving forward at that time.   Great toe pain, left Assessment & Plan: Acute, on exam it is evident that the rod placed in her great toe has protruded out of the skin chronically over time.  This is now causing her pain.  There is some associated redness but no clear infection.  Given she is a diabetic she is at high risk for cellulitis.  Will refer urgently to podiatry for further evaluation and treatment.   Other orders -     Mirabegron ER; Take 1 tablet (25 mg total) by mouth daily.  Dispense: 30 tablet; Refill: 11      Return in about 6 months (around 09/16/2023) for diabetes follow up with POC A1C.   Kerby Nora, MD

## 2023-03-19 NOTE — Assessment & Plan Note (Signed)
 Chronic,   Followed by Heme.

## 2023-03-19 NOTE — Assessment & Plan Note (Signed)
 Acute, on exam it is evident that the rod placed in her great toe has protruded out of the skin chronically over time.  This is now causing her pain.  There is some associated redness but no clear infection.  Given she is a diabetic she is at high risk for cellulitis.  Will refer urgently to podiatry for further evaluation and treatment.

## 2023-03-19 NOTE — Assessment & Plan Note (Signed)
 Acute, she is able to push the vaginal tissue back up and it stays for a while.  She denies any vaginal irritation or soreness.  No discharge.  She does have overactive bladder and stress incontinence symptoms.  No recurrent UTI. We discussed possible vaginal exam and consideration of pessary/GYN referral.  She will let me know if this continues to bother her or worsens and we will consider moving forward at that time.

## 2023-03-19 NOTE — Assessment & Plan Note (Signed)
 Chronic, diet controlled. Associated with neuropathy.

## 2023-03-19 NOTE — Assessment & Plan Note (Addendum)
 Chronic   Detrol LA 4 mg helped minimally.. some dry mouth.Alvester Morin interacts with tamoxifen.... but she stopped this several months ago  vesicare ... Insurance would not pay for this   Will try mirbegron now off tamoxifem.

## 2023-03-19 NOTE — Patient Instructions (Addendum)
 An try Xyzal for allergies in place of Zyrtec. Can try Singulair if not improving.  Will try trial of mirbegron for overactive bladder.

## 2023-03-19 NOTE — Assessment & Plan Note (Signed)
 Chronic, LDL almost at goal less than 70 given coronary artery disease   Compliant with atorvastatin 40 mg daily

## 2023-03-19 NOTE — Assessment & Plan Note (Signed)
 Followed by oncology

## 2023-03-19 NOTE — Assessment & Plan Note (Signed)
Associated with diabetes.  Well-controlled with current gabapentin regimen.. 

## 2023-03-19 NOTE — Assessment & Plan Note (Signed)
Chronic, in remission She states this is not an issue for her and does not feel she was really on the SSRI for this. 

## 2023-03-19 NOTE — Assessment & Plan Note (Signed)
 Followed by cardiology

## 2023-03-27 ENCOUNTER — Inpatient Hospital Stay: Payer: Medicare PPO | Attending: Oncology

## 2023-03-28 ENCOUNTER — Ambulatory Visit (INDEPENDENT_AMBULATORY_CARE_PROVIDER_SITE_OTHER)

## 2023-03-28 ENCOUNTER — Ambulatory Visit: Payer: Medicare PPO | Admitting: Podiatry

## 2023-03-28 ENCOUNTER — Encounter: Payer: Self-pay | Admitting: Podiatry

## 2023-03-28 ENCOUNTER — Inpatient Hospital Stay: Attending: Oncology

## 2023-03-28 DIAGNOSIS — E538 Deficiency of other specified B group vitamins: Secondary | ICD-10-CM | POA: Diagnosis not present

## 2023-03-28 DIAGNOSIS — T847XXA Infection and inflammatory reaction due to other internal orthopedic prosthetic devices, implants and grafts, initial encounter: Secondary | ICD-10-CM

## 2023-03-28 DIAGNOSIS — M8580 Other specified disorders of bone density and structure, unspecified site: Secondary | ICD-10-CM | POA: Insufficient documentation

## 2023-03-28 DIAGNOSIS — Z86000 Personal history of in-situ neoplasm of breast: Secondary | ICD-10-CM | POA: Diagnosis not present

## 2023-03-28 DIAGNOSIS — D509 Iron deficiency anemia, unspecified: Secondary | ICD-10-CM | POA: Insufficient documentation

## 2023-03-28 DIAGNOSIS — Z79810 Long term (current) use of selective estrogen receptor modulators (SERMs): Secondary | ICD-10-CM | POA: Diagnosis not present

## 2023-03-28 MED ORDER — CYANOCOBALAMIN 1000 MCG/ML IJ SOLN
1000.0000 ug | Freq: Once | INTRAMUSCULAR | Status: AC
  Administered 2023-03-28: 1000 ug via INTRAMUSCULAR
  Filled 2023-03-28: qty 1

## 2023-03-28 NOTE — Progress Notes (Signed)
 Chief Complaint  Patient presents with   Toe Pain    Hallux left - dark spot tip of toe that appears to be the screw head of a surgery she had done 12/02/19, starting to get sore   New Patient (Initial Visit)    Est pt 12/2019    HPI: 78 y.o. female presenting today as a reestablish new patient for evaluation of an orthopedic screw that is sticking out of the distal tip of the left hallux.  Past surgical history left great toe arthrodesis on 12/02/2019.  Healed without complication.  She says that over the last few weeks she noticed that the screw head is sticking out of the toe.  She went to her PCP and was referred here  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  Mastectomy/ SLN.   Chest pain    a. 10/2017 Cath: Nl cors.   Chronic headache    Chronic heart failure with preserved ejection fraction (HFpEF) (HCC)    a. 10/2021 Echo: EF 60-65%, no rwma, mild LVH, GrI DD, nl RV size/fxn, mild MR.   Complication of anesthesia    prior to 1991 used to have PONV.  none recently.   Diverticulosis of colon    Family history of adverse reaction to anesthesia    mother/daighter get sick   GERD with stricture    Hard of hearing    Hiatal hernia    Hypokalemia    Labile hypertension    a. 10/2017 Cath: nl renal arteries.   Mitral regurgitation    a. 10/2021 Echo: Mild MR.   Mixed hyperlipidemia    Neuropathy    feet   Osteoporosis    Pre-diabetes    Pulmonary hypertension (HCC)    a. 10/2017 Echo: PASP 35-58mmHg.   Wears dentures    full upper    Past Surgical History:  Procedure Laterality Date   ABDOMINAL HYSTERECTOMY  1978   one ovary remains   BREAST BIOPSY Left 05/26/2017   Affirm Bx- coil clip Ductal carcinoma in situ, high nuclear grade with calcifications and focal comedonecrosis   CARDIAC CATHETERIZATION     "yrs ago" all OK.   CATARACT EXTRACTION W/PHACO Left  02/21/2016   Procedure: CATARACT EXTRACTION PHACO AND INTRAOCULAR LENS PLACEMENT (IOC)  Left eye;  Surgeon: Lockie Mola, MD;  Location: [facility];  Service: Ophthalmology;  Laterality: Left;  Left eye Diabetic   CATARACT EXTRACTION W/PHACO Right 03/13/2016   Procedure: CATARACT EXTRACTION PHACO AND INTRAOCULAR LENS PLACEMENT (IOC)  right  diabetic;  Surgeon: Lockie Mola, MD;  Location: [facility];  Service: Ophthalmology;  Laterality: Right;  diabetic   CHOLECYSTECTOMY  1978   COLONOSCOPY W/ POLYPECTOMY  09/09/2013   8 mm tubular adenoma of the cecum.  Melvia Heaps, MD. [place] [facility]   COLONOSCOPY WITH PROPOFOL N/A 06/04/2019   Procedure: COLONOSCOPY WITH PROPOFOL;  Surgeon: Midge Minium, MD;  Location: [facility] END[facility];  Service: Endoscopy;  Laterality: N/A;   ESOPHAGOGASTRODUODENOSCOPY (EGD) WITH PROPOFOL N/A 06/04/2019   Procedure: ESOPHAGOGASTRODUODENOSCOPY (EGD) WITH PROPOFOL;  Surgeon: Midge Minium, MD;  Location: [facility] END[facility];  Service: Endoscopy;  Laterality: N/A;   JOINT REPLACEMENT Left    knee    LEFT HEART CATH AND CORONARY ANGIOGRAPHY N/A 11/06/2017   Procedure: LEFT HEART CATH AND CORONARY ANGIOGRAPHY;  Surgeon: Yvonne Kendall, MD;  Location: [facility] INVASIVE CV  LAB;  Service: Cardiovascular;  Laterality: N/A;   MASTECTOMY Left 06/13/2017   mastectomy neg margins   OOPHORECTOMY     one still left   RENAL ANGIOGRAPHY  11/06/2017   Procedure: RENAL ANGIOGRAPHY;  Surgeon: Yvonne Kendall, MD;  Location: [facility] INVAS[facility] CV LAB;  Service: Cardiovascular;;   SENTINEL NODE BIOPSY Left 06/13/2017   Procedure: SENTINEL NODE BIOPSY;  Surgeon: Earline Mayotte, MD;  Location: [facility] ORS;  Service: General;  Laterality: Left;   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  Surgeon: Earline Mayotte, MD;  Location: [facility] ORS;  Service: General;  Laterality: Left;    Allergies  Allergen  Reactions   Oxycodone Nausea Only   Sulfa Antibiotics Rash    Childhood reaction   Sulfonamide Derivatives Rash    Childhood reaction.     Physical Exam: General: The patient is alert and oriented x3 in no acute distress.  Dermatology: Skin is warm, dry and supple bilateral lower extremities.   Vascular: Palpable pedal pulses bilaterally. Capillary refill within normal limits.  No appreciable edema.  No erythema.  Neurological: Grossly intact via light touch  Musculoskeletal Exam: Cavus foot deformity noted.  Orthopedic screw visible protruding out of the distal tip of the toe.  There is no surrounding erythema or edema or concern for acute infection or cellulitis.  Radiographic Exam LT foot 03/28/2023:  Cavus foot type noted.  Orthopedic screw noted through the left great toe.  It appears stable without any radiolucencies or concern for underlying osteomyelitis.  It does seem to extend and protrude out of the soft tissues of the toe  Assessment/Plan of Care: 1.  Exposed screw head left great toe 2.  History of left great toe arthrodesis.  12/02/2019  -Patient was evaluated.  X-rays reviewed -The exposed portion of the orthopedic screw was identified and the screw was removed using a orthopedic driver.  Removed in toto.  Dressings applied -Recommend triple antibiotic and a Band-Aid overlying the screw head -Return to clinic in 2 weeks for follow-up x-ray       Felecia Shelling, DPM [facility]  Dr. Felecia Shelling, DPM    [address] [place], [place] [address]                Office [phone]  Fax [phone]

## 2023-04-03 ENCOUNTER — Other Ambulatory Visit: Payer: Self-pay | Admitting: Family Medicine

## 2023-04-15 ENCOUNTER — Inpatient Hospital Stay: Payer: Medicare PPO

## 2023-04-15 VITALS — BP 127/80 | HR 73 | Temp 97.7°F | Resp 16

## 2023-04-15 DIAGNOSIS — D0512 Intraductal carcinoma in situ of left breast: Secondary | ICD-10-CM

## 2023-04-15 DIAGNOSIS — M8580 Other specified disorders of bone density and structure, unspecified site: Secondary | ICD-10-CM | POA: Diagnosis not present

## 2023-04-15 DIAGNOSIS — D509 Iron deficiency anemia, unspecified: Secondary | ICD-10-CM | POA: Diagnosis not present

## 2023-04-15 DIAGNOSIS — Z86000 Personal history of in-situ neoplasm of breast: Secondary | ICD-10-CM | POA: Diagnosis not present

## 2023-04-15 DIAGNOSIS — Z79810 Long term (current) use of selective estrogen receptor modulators (SERMs): Secondary | ICD-10-CM | POA: Diagnosis not present

## 2023-04-15 DIAGNOSIS — E538 Deficiency of other specified B group vitamins: Secondary | ICD-10-CM

## 2023-04-15 LAB — CBC (CANCER CENTER ONLY)
HCT: 36 % (ref 36.0–46.0)
Hemoglobin: 11.3 g/dL — ABNORMAL LOW (ref 12.0–15.0)
MCH: 27 pg (ref 26.0–34.0)
MCHC: 31.4 g/dL (ref 30.0–36.0)
MCV: 86.1 fL (ref 80.0–100.0)
Platelet Count: 185 10*3/uL (ref 150–400)
RBC: 4.18 MIL/uL (ref 3.87–5.11)
RDW: 15.7 % — ABNORMAL HIGH (ref 11.5–15.5)
WBC Count: 6.3 10*3/uL (ref 4.0–10.5)
nRBC: 0 % (ref 0.0–0.2)

## 2023-04-15 LAB — BASIC METABOLIC PANEL - CANCER CENTER ONLY
Anion gap: 9 (ref 5–15)
BUN: 19 mg/dL (ref 8–23)
CO2: 23 mmol/L (ref 22–32)
Calcium: 9.1 mg/dL (ref 8.9–10.3)
Chloride: 106 mmol/L (ref 98–111)
Creatinine: 0.83 mg/dL (ref 0.44–1.00)
GFR, Estimated: >60 mL/min (ref >60)
Glucose, Bld: 123 mg/dL — ABNORMAL HIGH (ref 70–99)
Potassium: 3.5 mmol/L (ref 3.5–5.1)
Sodium: 138 mmol/L (ref 135–145)

## 2023-04-15 LAB — IRON AND TIBC
Iron: 34 ug/dL (ref 28–170)
Saturation Ratios: 9 % — ABNORMAL LOW (ref 10.4–31.8)
TIBC: 365 ug/dL (ref 250–450)
UIBC: 331 ug/dL

## 2023-04-15 LAB — FERRITIN: Ferritin: 18 ng/mL (ref 11–307)

## 2023-04-15 MED ORDER — SODIUM CHLORIDE 0.9 % IV SOLN
Freq: Once | INTRAVENOUS | Status: AC
  Filled 2023-04-15: qty 250

## 2023-04-15 MED ORDER — ZOLEDRONIC ACID 4 MG/100ML IV SOLN
4.0000 mg | Freq: Once | INTRAVENOUS | Status: AC
  Administered 2023-04-15: 4 mg via INTRAVENOUS
  Filled 2023-04-15: qty 100

## 2023-04-15 NOTE — Patient Instructions (Signed)

## 2023-04-18 ENCOUNTER — Encounter: Payer: Self-pay | Admitting: Podiatry

## 2023-04-18 ENCOUNTER — Ambulatory Visit (INDEPENDENT_AMBULATORY_CARE_PROVIDER_SITE_OTHER): Admitting: Podiatry

## 2023-04-18 DIAGNOSIS — T8484XA Pain due to internal orthopedic prosthetic devices, implants and grafts, initial encounter: Secondary | ICD-10-CM

## 2023-04-18 NOTE — Progress Notes (Signed)
 Chief Complaint  Patient presents with   Toe Pain    diabetic with left great toe pain( hx of rod in great toe)states feet are feeling better beside the neuropathy pain    HPI: 78 y.o. female presenting today after removal of an orthopedic screw to the left great toe performed on 03/28/2023.  She says that she is doing well.  She no longer has any pain or tenderness.  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  Mastectomy/ SLN.   Chest pain    a. 10/2017 Cath: Nl cors.   Chronic headache    Chronic heart failure with preserved ejection fraction (HFpEF) (HCC)    a. 10/2021 Echo: EF 60-65%, no rwma, mild LVH, GrI DD, nl RV size/fxn, mild MR.   Complication of anesthesia    prior to 1991 used to have PONV.  none recently.   Diverticulosis of colon    Family history of adverse reaction to anesthesia    mother/daighter get sick   GERD with stricture    Hard of hearing    Hiatal hernia    Hypokalemia    Labile hypertension    a. 10/2017 Cath: nl renal arteries.   Mitral regurgitation    a. 10/2021 Echo: Mild MR.   Mixed hyperlipidemia    Neuropathy    feet   Osteoporosis    Pre-diabetes    Pulmonary hypertension (HCC)    a. 10/2017 Echo: PASP 35-52mmHg.   Wears dentures    full upper    Past Surgical History:  Procedure Laterality Date   ABDOMINAL HYSTERECTOMY  1978   one ovary remains   BREAST BIOPSY Left 05/26/2017   Affirm Bx- coil clip Ductal carcinoma in situ, high nuclear grade with calcifications and focal comedonecrosis   CARDIAC CATHETERIZATION     "yrs ago" all OK.   CATARACT EXTRACTION W/PHACO Left 02/21/2016   Procedure: CATARACT EXTRACTION PHACO AND INTRAOCULAR LENS PLACEMENT (IOC)  Left eye;  Surgeon: Lockie Mola, MD;  Location: [facility] SURGERY [facility];  Service: Ophthalmology;  Laterality: Left;  Left eye Diabetic   CATARACT EXTRACTION  W/PHACO Right 03/13/2016   Procedure: CATARACT EXTRACTION PHACO AND INTRAOCULAR LENS PLACEMENT (IOC)  right  diabetic;  Surgeon: Lockie Mola, MD;  Location: [facility] SURGERY CNTR;  Service: Ophthalmology;  Laterality: Right;  diabetic   CHOLECYSTECTOMY  1978   COLONOSCOPY W/ POLYPECTOMY  09/09/2013   8 mm tubular adenoma of the cecum.  Melvia Heaps, MD. [facility]   COLONOSCOPY WITH PROPOFOL N/A 06/04/2019   Procedure: COLONOSCOPY WITH PROPOFOL;  Surgeon: Midge Minium, MD;  Location: [facility] ENDOSCOPY;  Service: Endoscopy;  Laterality: N/A;   ESOPHAGOGASTRODUODENOSCOPY (EGD) WITH PROPOFOL N/A 06/04/2019   Procedure: ESOPHAGOGASTRODUODENOSCOPY (EGD) WITH PROPOFOL;  Surgeon: Midge Minium, MD;  Location: [facility] ENDOSCOPY;  Service: Endoscopy;  Laterality: N/A;   JOINT REPLACEMENT Left    knee    LEFT HEART CATH AND CORONARY ANGIOGRAPHY N/A 11/06/2017   Procedure: LEFT HEART CATH AND CORONARY ANGIOGRAPHY;  Surgeon: Yvonne Kendall, MD;  Location: [facility] INVASIVE CV LAB;  Service: Cardiovascular;  Laterality: N/A;   MASTECTOMY Left 06/13/2017   mastectomy neg margins   OOPHORECTOMY     one still left   RENAL ANGIOGRAPHY  11/06/2017   Procedure: RENAL ANGIOGRAPHY;  Surgeon: Yvonne Kendall, MD;  Location: [facility] INVASIVE CV LAB;  Service:  Cardiovascular;;   SENTINEL NODE BIOPSY Left 06/13/2017   Procedure: SENTINEL NODE BIOPSY;  Surgeon: Earline Mayotte, MD;  Location: [facility] ORS;  Service: General;  Laterality: Left;   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  Surgeon: Earline Mayotte, MD;  Location: [facility] ORS;  Service: General;  Laterality: Left;    Allergies  Allergen Reactions   Oxycodone Nausea Only   Sulfa Antibiotics Rash    Childhood reaction   Sulfonamide Derivatives Rash    Childhood reaction.     Physical Exam: General: The patient is alert and oriented x3 in no acute distress.  Dermatology: Skin is  warm, dry and supple bilateral lower extremities.  No open wound.  No erythema or edema noted.  Clinically the toe appears to be completely resolved and healed  Vascular: Palpable pedal pulses bilaterally. Capillary refill within normal limits.  No appreciable edema.  No erythema.  Neurological: Grossly intact via light touch  Musculoskeletal Exam: Cavus foot deformity noted.  The great toes in good rectus alignment.  No tenderness with palpation.  Radiographic Exam LT foot 03/28/2023:  Cavus foot type noted.  Orthopedic screw noted through the left great toe.  It appears stable without any radiolucencies or concern for underlying osteomyelitis.  It does seem to extend and protrude out of the soft tissues of the toe  Assessment/Plan of Care: 1.  S/P removal of orthopedic screw left great toe 2.  History of left great toe arthrodesis.  12/02/2019  -Patient was evaluated.   -The patient has no pain or tenderness to the great toe.  There is no inflammation or erythema or concern for infection.  She is very satisfied and overall doing well -Recommend good supportive tennis shoes and sneakers -Return to clinic 3 months routine footcare       Felecia Shelling, DPM [facility]  Dr. Felecia Shelling, DPM    [address] [place], [place] [address]                Office [phone]  Fax [phone]

## 2023-04-22 ENCOUNTER — Telehealth: Payer: Self-pay

## 2023-04-22 NOTE — Telephone Encounter (Signed)
 Spoke to pt and informed of MD/ venofer appt request and reminded her about inj appt on 4/7. Pt states that she cannot do 4/7 appt and will need to r/s   Please contact pt to schedule MD/ venfoer / b12 inj. Ok to cancel b12 on 4/7.

## 2023-04-22 NOTE — Telephone Encounter (Signed)
-----   Message from Rickard Patience sent at 04/21/2023  9:58 PM EDT ----- Please let pt know that her blood count is better, iron is still low. Please arrange her to see me  - MD + Venofer. Thanks.

## 2023-04-25 ENCOUNTER — Encounter: Payer: Self-pay | Admitting: Urgent Care

## 2023-04-28 ENCOUNTER — Inpatient Hospital Stay: Payer: Medicare PPO

## 2023-04-29 ENCOUNTER — Inpatient Hospital Stay: Attending: Oncology | Admitting: Oncology

## 2023-04-29 ENCOUNTER — Encounter: Payer: Self-pay | Admitting: Oncology

## 2023-04-29 ENCOUNTER — Encounter: Payer: Self-pay | Admitting: Urgent Care

## 2023-04-29 ENCOUNTER — Inpatient Hospital Stay

## 2023-04-29 VITALS — BP 136/82 | HR 74 | Temp 97.4°F | Resp 18 | Wt 119.5 lb

## 2023-04-29 VITALS — BP 134/82 | HR 72 | Resp 18

## 2023-04-29 DIAGNOSIS — Z79899 Other long term (current) drug therapy: Secondary | ICD-10-CM | POA: Insufficient documentation

## 2023-04-29 DIAGNOSIS — E538 Deficiency of other specified B group vitamins: Secondary | ICD-10-CM

## 2023-04-29 DIAGNOSIS — D509 Iron deficiency anemia, unspecified: Secondary | ICD-10-CM | POA: Diagnosis not present

## 2023-04-29 DIAGNOSIS — Z86000 Personal history of in-situ neoplasm of breast: Secondary | ICD-10-CM | POA: Insufficient documentation

## 2023-04-29 DIAGNOSIS — M810 Age-related osteoporosis without current pathological fracture: Secondary | ICD-10-CM | POA: Insufficient documentation

## 2023-04-29 DIAGNOSIS — D0512 Intraductal carcinoma in situ of left breast: Secondary | ICD-10-CM | POA: Diagnosis not present

## 2023-04-29 DIAGNOSIS — Z9012 Acquired absence of left breast and nipple: Secondary | ICD-10-CM | POA: Insufficient documentation

## 2023-04-29 MED ORDER — IRON SUCROSE 20 MG/ML IV SOLN
200.0000 mg | Freq: Once | INTRAVENOUS | Status: AC
  Administered 2023-04-29: 200 mg via INTRAVENOUS
  Filled 2023-04-29: qty 10

## 2023-04-29 MED ORDER — CYANOCOBALAMIN 1000 MCG/ML IJ SOLN
1000.0000 ug | Freq: Once | INTRAMUSCULAR | Status: AC
Start: 2023-04-29 — End: 2023-04-29
  Administered 2023-04-29: 1000 ug via INTRAMUSCULAR
  Filled 2023-04-29: qty 1

## 2023-04-29 NOTE — Assessment & Plan Note (Addendum)
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 finished 5 years,  Continue annual mammogram

## 2023-04-29 NOTE — Assessment & Plan Note (Signed)
Recommend B12 IM injection monthly

## 2023-04-29 NOTE — Assessment & Plan Note (Signed)
#  Osteoporosis. Recommend calcium and vitamin D supplementation.   Proceed with Zometa 4 mg every 6 months.

## 2023-04-29 NOTE — Progress Notes (Signed)
 Pt here for follow up. Pt reports feeling very tired.

## 2023-04-29 NOTE — Progress Notes (Signed)
 Hematology/Oncology Progress note [phone] [phone]    Chief Complaint: Ashley Ryan is a 78 y.o. female presents for follow up of left breast DCIS s/p mastectomy and B12 deficiency.  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 finished 5 years,  Continue annual mammogram  Osteoporosis #Osteoporosis. Recommend calcium and vitamin D supplementation.   Proceed with Zometa 4 mg every 6 months.    B12 deficiency Recommend B12 IM injection monthly   Iron deficiency anemia Labs are reviewed and discussed with patient. Lab Results  Component Value Date   HGB 11.3 (L) 04/15/2023   TIBC 365 04/15/2023   IRONPCTSAT 9 (L) 04/15/2023   FERRITIN 18 04/15/2023    Hemoglobin has improved, still iron deficient  Recommend IV Venofer weekly x 4.  Suspect GI bleeding. Recommend patient to re -establish care with GI      Orders Placed This Encounter  Procedures   Retic Panel    Standing Status:   Future    Expected Date:   10/29/2023    Expiration Date:   04/28/2024   Follow up per LOS  All questions were answered. The patient knows to call the clinic with any problems, questions or concerns.  Rickard Patience, MD, PhD [facility] Hematology Oncology 04/29/2023   PERTINENT ONCOLOGY HISTORY Ashley Ryan is a 78 y.o.afemale who has above oncology history reviewed by me today presented for follow up visit for management of  left breast DCIS s/p mastectomy and B12 deficiency.  Patient previously followed up by Dr.Corcoran, patient switched care to me on 09/07/20 Extensive medical record review was performed by me  06/13/2017.  left breast DCIS s/p simple mastectomy on  Pathology revealed at least 8 mm of grade III DCIS.  Margins were negative.  One sentinel lymph node was negative.  DCIS was ER was + (> 90%) and PR + (75%).  Pathologic stage was Tis N0.    07/03/2017. tamoxifen   # Bone health.  Osteopenia 08/04/2019 Bone density revealed osteopenia with a T score of -2.1 in the right femoral neck.    # iron deficiency anemia.  history of blood transfusion  EGD on 09/09/2013 revealed Cameron erosions, esophageal stricture s/p Maloney dilatation, and a large sliding hiatal hernia. EGD on 06/04/2019 revealed a medium-sized hiatal hernia, gastritis, and one duodenal polyp (peptic duodenitis).  Pathology revealed iron pill gastritis and no H pylori, metaplasia, dysplasia or malignancy.  Colonoscopy on 09/09/2013 revealed an 8 mm sessile polyp in the cecum (tubular adenoma).  Colonoscopy on 06/04/2019 revealed two 6 to 8 mm polyps in the transverse colon (tubular adenomas), and one 6 mm polyp in the rectum (tubular adenoma).  Clip (MR conditional) was placed.  There was one 3 mm polyp in the ascending colon (tubular adenoma).  There were non-bleeding internal hemorrhoids and diverticulosis in the sigmoid colon.   09/07/2021 unilateral right screening mammogram showed possible asymmetry.  Asymmetry did not persist on additional views   INTERVAL HISTORY Ashley Ryan is a 78 y.o. female who has above history reviewed by me today presents for follow up visit for management of history of DCIS, osteoporosis. Patient reports feeling fatigued. She has no new concerns.  No breast concerns.    Past Medical History:  Diagnosis Date   Anemia    Arthritis    Breast cancer (HCC) 06/13/2017   Left, 8 mm high  grade DCIS. ER/PR positive.  Mastectomy/ SLN.   Chest pain    a. 10/2017 Cath: Nl cors.   Chronic headache    Chronic heart failure with preserved ejection fraction (HFpEF) (HCC)    a. 10/2021 Echo: EF 60-65%, no rwma, mild LVH, GrI DD, nl RV size/fxn, mild MR.   Complication of anesthesia    prior to 1991 used to have PONV.  none recently.   Diverticulosis of colon    Family history of adverse reaction to anesthesia    mother/daighter get sick    GERD with stricture    Hard of hearing    Hiatal hernia    Hypokalemia    Labile hypertension    a. 10/2017 Cath: nl renal arteries.   Mitral regurgitation    a. 10/2021 Echo: Mild MR.   Mixed hyperlipidemia    Neuropathy    feet   Osteoporosis    Pre-diabetes    Pulmonary hypertension (HCC)    a. 10/2017 Echo: PASP 35-76mmHg.   Wears dentures    full upper    Past Surgical History:  Procedure Laterality Date   ABDOMINAL HYSTERECTOMY  1978   one ovary remains   BREAST BIOPSY Left 05/26/2017   Affirm Bx- coil clip Ductal carcinoma in situ, high nuclear grade with calcifications and focal comedonecrosis   CARDIAC CATHETERIZATION     "yrs ago" all OK.   CATARACT EXTRACTION W/PHACO Left 02/21/2016   Procedure: CATARACT EXTRACTION PHACO AND INTRAOCULAR LENS PLACEMENT (IOC)  Left eye;  Surgeon: Lockie Mola, MD;  Location: [facility] SURGERY [facility];  Service: Ophthalmology;  Laterality: Left;  Left eye Diabetic   CATARACT EXTRACTION W/PHACO Right 03/13/2016   Procedure: CATARACT EXTRACTION PHACO AND INTRAOCULAR LENS PLACEMENT (IOC)  right  diabetic;  Surgeon: Lockie Mola, MD;  Location: [facility];  Service: Ophthalmology;  Laterality: Right;  diabetic   CHOLECYSTECTOMY  1978   COLONOSCOPY W/ POLYPECTOMY  09/09/2013   8 mm tubular adenoma of the cecum.  Melvia Heaps, MD. [facility]   COLONOSCOPY WITH PROPOFOL N/A 06/04/2019   Procedure: COLONOSCOPY WITH PROPOFOL;  Surgeon: Midge Minium, MD;  Location: [facility] ENDOSCOPY;  Service: Endoscopy;  Laterality: N/A;   ESOPHAGOGASTRODUODENOSCOPY (EGD) WITH PROPOFOL N/A 06/04/2019   Procedure: ESOPHAGOGASTRODUODENOSCOPY (EGD) WITH PROPOFOL;  Surgeon: Midge Minium, MD;  Location: [facility] ENDOSCOPY;  Service: Endoscopy;  Laterality: N/A;   JOINT REPLACEMENT Left    knee    LEFT HEART CATH AND CORONARY ANGIOGRAPHY N/A 11/06/2017   Procedure: LEFT HEART CATH AND CORONARY ANGIOGRAPHY;  Surgeon: Yvonne Kendall, MD;  Location:  [facility] INVASIVE CV LAB;  Service: Cardiovascular;  Laterality: N/A;   MASTECTOMY Left 06/13/2017   mastectomy neg margins   OOPHORECTOMY     one still left   RENAL ANGIOGRAPHY  11/06/2017   Procedure: RENAL ANGIOGRAPHY;  Surgeon: Yvonne Kendall, MD;  Location: [facility] INVASIVE CV LAB;  Service: Cardiovascular;;   SENTINEL NODE BIOPSY Left 06/13/2017   Procedure: SENTINEL NODE BIOPSY;  Surgeon: Earline Mayotte, MD;  Location: [facility] ORS;  Service: General;  Laterality: Left;   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  Surgeon: Earline Mayotte, MD;  Location: [facility] ORS;  Service: General;  Laterality: Left;    Family History  Problem Relation Age of Onset   Breast cancer Other    Stomach cancer Other    Diabetes Other    Heart disease Other    Breast  cancer Sister 23   Esophageal cancer Brother    Hypertension Mother    Heart attack Mother 58   Hypertension Father    Coronary artery disease Father 2       CABG   Stroke Father     Social History:  reports that she has never smoked. She has never used smokeless tobacco. She reports that she does not drink alcohol and does not use drugs. Her husband's name is Freight forwarder.  She is a retired Lawyer.  She lives in [place]. The patient is alone today.   Allergies:  Allergies  Allergen Reactions   Oxycodone Nausea Only   Sulfa Antibiotics Rash    Childhood reaction   Sulfonamide Derivatives Rash    Childhood reaction.    Current Medications: Current Outpatient Medications  Medication Sig Dispense Refill   Alcohol Swabs (DROPSAFE ALCOHOL PREP) 70 % PADS USE  TO TEST BLOOD SUGAR ONE TIME DAILY 100 each 3   amLODipine (NORVASC) 5 MG tablet TAKE 1 TABLET (5 MG TOTAL) BY MOUTH DAILY. 90 tablet 3   atorvastatin (LIPITOR) 40 MG tablet TAKE 1 TABLET AT BEDTIME 90 tablet 3   Blood Glucose Calibration (ACCU-CHEK AVIVA) SOLN Use to check blood sugar once daily 1 each 0    Blood Glucose Calibration (TRUE METRIX LEVEL 1) Low SOLN USE AS DIRECTED 1 each 3   Blood Glucose Monitoring Suppl (TRUE METRIX AIR GLUCOSE METER) w/Device KIT Use to check blood sugar daily 1 kit 0   brimonidine (ALPHAGAN) 0.2 % ophthalmic solution brimonidine 0.2 % eye drops  INSTILL 1 DROP INTO BOTH EYES TWICE A DAY     Calcium Carb-Cholecalciferol (CALCIUM 600 + D) 600-5 MG-MCG TABS Take 2 tablets by mouth daily. 180 tablet 2   diclofenac (VOLTAREN) 75 MG EC tablet TAKE 1 TABLET BY MOUTH TWICE A DAY 60 tablet 1   diclofenac Sodium (VOLTAREN) 1 % GEL APPLY 2 GRAMS TOPICALLY TO THE AFFECTED AREA 4 TIMES DAILY. 700 g 11   esomeprazole (NEXIUM) 40 MG capsule TAKE 1 CAPSULE TWICE DAILY BEFORE MEALS 180 capsule 3   fluticasone (FLONASE) 50 MCG/ACT nasal spray USE 2 SPRAYS INTO THE NOSE DAILY AS DIRECTED (SUBSTITUTED FOR FLONASE) 48 g 3   furosemide (LASIX) 20 MG tablet TAKE 1 TABLET BY MOUTH EVERY DAY AS NEEDED 30 tablet 5   gabapentin (NEURONTIN) 100 MG capsule TAKE 1 CAPSULE EVERY MORNING , 1 CAPSULE AT LUNCH, 1 CAPSULE AT DINNER, AND 2-3 CAPSULES AT BEDTIME 540 capsule 3   Glucosamine 500 MG TABS Take 500 mg by mouth daily.     glucose blood (TRUE METRIX BLOOD GLUCOSE TEST) test strip Use to check blood sugar daily 100 each 3   Iron-Vitamin C 65-125 MG TABS Take 1 tablet by mouth daily at 2 PM. 30 tablet 5   isosorbide mononitrate (IMDUR) 30 MG 24 hr tablet TAKE 1 TABLET EVERY DAY 90 tablet 0   latanoprost (XALATAN) 0.005 % ophthalmic solution Place 1 drop into both eyes at bedtime.     lidocaine (LIDODERM) 5 % APPLY 1 PATCH ONTO THE SKIN EVERY DAY. REMOVE AND DISCARD PATCH WITHIN 12 HOURS OR AS DIRECTED BY PHYSICIAN 90 patch 3   losartan-hydrochlorothiazide (HYZAAR) 100-25 MG tablet Take 1 tablet by mouth daily.     mirabegron ER (MYRBETRIQ) 25 MG TB24 tablet Take 1 tablet (25 mg total) by mouth daily. 30 tablet 11   Omega-3 Fatty Acids (FISH OIL) 1000 MG CAPS Take 1,000 mg by mouth  daily.       polycarbophil (FIBERCON) 625 MG tablet Take 625 mg by mouth daily.     potassium chloride SA (KLOR-CON M20) 20 MEQ tablet TAKE 1 TABLET BY MOUTH TWICE A DAY 180 tablet 2   traMADol (ULTRAM) 50 MG tablet Take 1 tablet (50 mg total) by mouth every 6 (six) hours as needed. for pain 90 tablet 0   triamcinolone cream (KENALOG) 0.5 % Apply 1 application topically 2 (two) times daily. 30 g 0   TRUEplus Lancets 28G MISC TEST BLOOD SUGAR EVERY DAY 100 each 3   No current facility-administered medications for this visit.   Facility-Administered Medications Ordered in Other Visits  Medication Dose Route Frequency Provider Last Rate Last Admin   cyanocobalamin (VITAMIN B12) injection 1,000 mcg  1,000 mcg Intramuscular Once Rickard Patience, MD       Review of Systems  Constitutional:  Positive for fatigue. Negative for appetite change, chills and fever.  HENT:   Negative for hearing loss and voice change.   Eyes:  Negative for eye problems.  Respiratory:  Negative for chest tightness and cough.   Cardiovascular:  Negative for chest pain.  Gastrointestinal:  Negative for abdominal distention, abdominal pain and blood in stool.  Endocrine: Negative for hot flashes.  Genitourinary:  Negative for difficulty urinating and frequency.   Musculoskeletal:  Positive for arthralgias and back pain.  Skin:  Negative for itching and rash.  Neurological:  Negative for extremity weakness.  Hematological:  Negative for adenopathy.  Psychiatric/Behavioral:  Negative for confusion.      Performance status (ECOG): 1  Vital Signs Blood pressure 136/82, pulse 74, temperature (!) 97.4 F (36.3 C), resp. rate 18, weight 119 lb 8 oz (54.2 kg).  Physical Exam Constitutional:      General: She is not in acute distress.    Appearance: She is not diaphoretic.     Comments: Thin, ambulates independantly  HENT:     Head: Normocephalic and atraumatic.  Eyes:     General: No scleral icterus. Cardiovascular:     Rate and  Rhythm: Normal rate.  Pulmonary:     Effort: Pulmonary effort is normal. No respiratory distress.  Chest:     Chest wall: No tenderness.  Abdominal:     General: There is no distension.     Palpations: Abdomen is soft.     Tenderness: There is no abdominal tenderness.  Musculoskeletal:        General: Normal range of motion.     Cervical back: Normal range of motion and neck supple.  Skin:    General: Skin is warm.     Findings: No erythema.  Neurological:     Mental Status: She is alert and oriented to person, place, and time.     Cranial Nerves: No cranial nerve deficit.     Motor: No abnormal muscle tone.  Psychiatric:        Mood and Affect: Mood and affect normal.     Laboratory studies.     Latest Ref Rng & Units 04/15/2023   12:56 PM 10/16/2022    2:37 PM 03/15/2022   10:05 AM  CBC  WBC 4.0 - 10.5 K/uL 6.3  7.2  6.4   Hemoglobin 12.0 - 15.0 g/dL 96.0  45.4  9.8   Hematocrit 36.0 - 46.0 % 36.0  34.5  32.1   Platelets 150 - 400 K/uL 185  151  176        Latest Ref Rng &  Units 04/15/2023   12:57 PM 02/18/2023    8:26 AM 10/16/2022    2:37 PM  CMP  Glucose 70 - 99 mg/dL 045  79  93   BUN 8 - 23 mg/dL 19  11  17    Creatinine 0.44 - 1.00 mg/dL 4.09  8.11  9.14   Sodium 135 - 145 mmol/L 138  140  137   Potassium 3.5 - 5.1 mmol/L 3.5  4.1  3.1   Chloride 98 - 111 mmol/L 106  105  107   CO2 22 - 32 mmol/L 23  30  23    Calcium 8.9 - 10.3 mg/dL 9.1  9.1  9.1   Total Protein 6.0 - 8.3 g/dL  6.1  6.9   Total Bilirubin 0.2 - 1.2 mg/dL  0.4  0.3   Alkaline Phos 39 - 117 U/L  57  41   AST 0 - 37 U/L  14  15   ALT 0 - 35 U/L  12  11

## 2023-04-29 NOTE — Patient Instructions (Signed)

## 2023-04-29 NOTE — Assessment & Plan Note (Addendum)
 Labs are reviewed and discussed with patient. Lab Results  Component Value Date   HGB 11.3 (L) 04/15/2023   TIBC 365 04/15/2023   IRONPCTSAT 9 (L) 04/15/2023   FERRITIN 18 04/15/2023    Hemoglobin has improved, still iron deficient  Recommend IV Venofer weekly x 4.  Suspect GI bleeding. Recommend patient to re -establish care with GI

## 2023-04-30 ENCOUNTER — Telehealth: Payer: Self-pay

## 2023-04-30 NOTE — Telephone Encounter (Signed)
-----   Message from Rickard Patience sent at 04/29/2023  7:18 PM EDT ----- Please add on + Venofer + B12inj to her appt in 6 months with me.

## 2023-05-06 ENCOUNTER — Inpatient Hospital Stay

## 2023-05-06 DIAGNOSIS — H401133 Primary open-angle glaucoma, bilateral, severe stage: Secondary | ICD-10-CM | POA: Diagnosis not present

## 2023-05-06 DIAGNOSIS — H353132 Nonexudative age-related macular degeneration, bilateral, intermediate dry stage: Secondary | ICD-10-CM | POA: Diagnosis not present

## 2023-05-06 DIAGNOSIS — H18513 Endothelial corneal dystrophy, bilateral: Secondary | ICD-10-CM | POA: Diagnosis not present

## 2023-05-06 DIAGNOSIS — Z0100 Encounter for examination of eyes and vision without abnormal findings: Secondary | ICD-10-CM | POA: Diagnosis not present

## 2023-05-07 ENCOUNTER — Encounter: Payer: Self-pay | Admitting: Urgent Care

## 2023-05-07 ENCOUNTER — Inpatient Hospital Stay

## 2023-05-07 VITALS — BP 115/97 | HR 83 | Temp 97.3°F | Resp 18

## 2023-05-07 DIAGNOSIS — Z79899 Other long term (current) drug therapy: Secondary | ICD-10-CM | POA: Diagnosis not present

## 2023-05-07 DIAGNOSIS — Z86000 Personal history of in-situ neoplasm of breast: Secondary | ICD-10-CM | POA: Diagnosis not present

## 2023-05-07 DIAGNOSIS — M810 Age-related osteoporosis without current pathological fracture: Secondary | ICD-10-CM | POA: Diagnosis not present

## 2023-05-07 DIAGNOSIS — D509 Iron deficiency anemia, unspecified: Secondary | ICD-10-CM | POA: Diagnosis not present

## 2023-05-07 DIAGNOSIS — E538 Deficiency of other specified B group vitamins: Secondary | ICD-10-CM

## 2023-05-07 DIAGNOSIS — Z9012 Acquired absence of left breast and nipple: Secondary | ICD-10-CM | POA: Diagnosis not present

## 2023-05-07 MED ORDER — IRON SUCROSE 20 MG/ML IV SOLN
200.0000 mg | Freq: Once | INTRAVENOUS | Status: AC
  Administered 2023-05-07: 200 mg via INTRAVENOUS
  Filled 2023-05-07: qty 10

## 2023-05-07 NOTE — Progress Notes (Signed)
 Deion Forgue T. Jene Huq, MD, CAQ Sports Medicine [facility] at [facility] [address] [place], [address]  Phone: [phone]  FAX: [phone]  Ashley Ryan - 78 y.o. female  MRN [id number]  Date of Birth: [birth year]  Date: 05/08/2023  PCP: Judithann Novas, MD  Referral: Judithann Novas, MD  Chief Complaint  Patient presents with   Right Knee Pain    Still having issues with pain. Knows she needs a replacement, but unable to with her husband's declining health.    Left Shoulder Pain   Handicap Plate Form   Subjective:   Ashley Ryan is a 78 y.o. very pleasant female patient with Body mass index is 24.45 kg/m. who presents with the following:  She is a very pleasant patient who I am going for many years.  She has been struggling with severe right-sided knee arthritis for an extended period of time.  She has been having issues with her knee for years.  She knows that she has got advanced knee arthritis.  She is not really at this point ready for surgery and she is the primary caregiver for her husband who is in poor health.  She continues to have intermittent knee pain effusions and weakness.  She has some left shoulder pain that has been also off and down, but at times it is quite painful and painful for her to reach up into a cabinet to get things down.  She has not had any trauma or injury.  Review of Systems is noted in the HPI, as appropriate  Objective:   BP 102/64 (BP Location: Right Arm, Patient Position: Sitting, Cuff Size: Normal)   Pulse 94   Temp 97.9 F (36.6 C) (Oral)   Ht 4\' 10"  (1.473 m)   Wt 117 lb (53.1 kg)   SpO2 96%   BMI 24.45 kg/m   GEN: No acute distress; alert,appropriate. PULM: Breathing comfortably in no respiratory distress PSYCH: Normally interactive.   Right knee: She has a mild effusion.  She lacks 4 degrees of extension and flexion to 95 degrees Extensive medial lateral joint line tenderness Stable to varus  and valgus stress, and her ACL and PCL are intact Any kind of extension or flexion that is forced causes pain.   Shoulder: L Inspection: No muscle wasting or winging Ecchymosis/edema: neg  AC joint, scapula, clavicle: NT Cervical spine: NT, full ROM Spurling's: neg Abduction: full, 5/5 Flexion: full, 5/5 IR, full, lift-off: 5/5 ER at neutral: full, 5/5 AC crossover: neg Neer: pos Hawkins: pos Drop Test: neg Jobe: pos Supraspinatus insertion: mild-mod T Bicipital groove: NT Speed's: neg Yergason's: neg Sulcus sign: neg Scapular dyskinesis: none C5-T1 intact  Neuro: Sensation intact Grip 5/5   Laboratory and Imaging Data:  Assessment and Plan:     ICD-10-CM   1. Primary osteoarthritis of right knee  M17.11 triamcinolone  acetonide (KENALOG -40) injection 40 mg    2. Chronic left shoulder pain  M25.512 DG Shoulder Left   G89.29      On the left shoulder x-ray, she has some early arthritic changes at the glenohumeral joint, but it is decently preserved.  She wants to continue to just do basic things such as range of motion and Tylenol  as she needs for her shoulder.  We will do a knee injection today.  At any point it would be reasonable to have her talk with one of the total joint surgeons.  Aspiration/Injection Procedure Note Mauri Sous  [birth year] Date of procedure: 05/08/2023  Procedure: Large Joint Aspiration / Injection of Knee, R Indications: Pain  Procedure Details Patient verbally consented to procedure. Risks, benefits, and alternatives explained. Sterilely prepped with Chloraprep. Ethyl cholride used for anesthesia. 9 cc Lidocaine  1% mixed with 1 mL of Kenalog  40 mg injected using the anteromedial approach without difficulty. No complications with procedure and tolerated well. Patient had decreased pain post-injection. Medication: 1 mL of Kenalog  40 mg   Medication Management during today's office visit: Meds ordered this encounter  Medications    triamcinolone  acetonide (KENALOG -40) injection 40 mg   There are no discontinued medications.  Orders placed today for conditions managed today: Orders Placed This Encounter  Procedures   DG Shoulder Left    Disposition: No follow-ups on file.  Dragon [employer] One speech-to-text software was used for transcription in this dictation.  Possible transcriptional errors can occur using Animal nutritionist.   Signed,  Ranny Bye. Cherree Conerly, MD   Outpatient Encounter Medications as of 05/08/2023  Medication Sig   Alcohol Swabs (DROPSAFE ALCOHOL PREP) 70 % PADS USE  TO TEST BLOOD SUGAR ONE TIME DAILY   amLODipine  (NORVASC ) 5 MG tablet TAKE 1 TABLET (5 MG TOTAL) BY MOUTH DAILY.   atorvastatin  (LIPITOR) 40 MG tablet TAKE 1 TABLET AT BEDTIME   Blood Glucose Calibration (ACCU-CHEK AVIVA) SOLN Use to check blood sugar once daily   Blood Glucose Calibration (TRUE METRIX LEVEL 1) Low SOLN USE AS DIRECTED   Blood Glucose Monitoring Suppl (TRUE METRIX AIR GLUCOSE METER) w/Device KIT Use to check blood sugar daily   brimonidine  (ALPHAGAN ) 0.2 % ophthalmic solution brimonidine  0.2 % eye drops  INSTILL 1 DROP INTO BOTH EYES TWICE A DAY   Calcium  Carb-Cholecalciferol  (CALCIUM  600 + D) 600-5 MG-MCG TABS Take 2 tablets by mouth daily.   diclofenac  (VOLTAREN ) 75 MG EC tablet TAKE 1 TABLET BY MOUTH TWICE A DAY   diclofenac  Sodium (VOLTAREN ) 1 % GEL APPLY 2 GRAMS TOPICALLY TO THE AFFECTED AREA 4 TIMES DAILY.   esomeprazole  (NEXIUM ) 40 MG capsule TAKE 1 CAPSULE TWICE DAILY BEFORE MEALS   fluticasone  (FLONASE ) 50 MCG/ACT nasal spray USE 2 SPRAYS INTO THE NOSE DAILY AS DIRECTED (SUBSTITUTED FOR FLONASE )   furosemide  (LASIX ) 20 MG tablet TAKE 1 TABLET BY MOUTH EVERY DAY AS NEEDED   gabapentin  (NEURONTIN ) 100 MG capsule TAKE 1 CAPSULE EVERY MORNING , 1 CAPSULE AT LUNCH, 1 CAPSULE AT DINNER, AND 2-3 CAPSULES AT BEDTIME   Glucosamine 500 MG TABS Take 500 mg by mouth daily.   glucose blood (TRUE METRIX BLOOD GLUCOSE TEST)  test strip Use to check blood sugar daily   Iron -Vitamin C  65-125 MG TABS Take 1 tablet by mouth daily at 2 PM.   isosorbide  mononitrate (IMDUR ) 30 MG 24 hr tablet TAKE 1 TABLET EVERY DAY   latanoprost  (XALATAN ) 0.005 % ophthalmic solution Place 1 drop into both eyes at bedtime.   lidocaine  (LIDODERM ) 5 % APPLY 1 PATCH ONTO THE SKIN EVERY DAY. REMOVE AND DISCARD PATCH WITHIN 12 HOURS OR AS DIRECTED BY PHYSICIAN   losartan -hydrochlorothiazide  (HYZAAR) 100-25 MG tablet Take 1 tablet by mouth daily.   mirabegron  ER (MYRBETRIQ ) 25 MG TB24 tablet Take 1 tablet (25 mg total) by mouth daily.   Omega-3 Fatty Acids (FISH OIL) 1000 MG CAPS Take 1,000 mg by mouth daily.    polycarbophil (FIBERCON) 625 MG tablet Take 625 mg by mouth daily.   potassium chloride  SA (KLOR-CON  M20) 20 MEQ tablet TAKE 1 TABLET BY MOUTH  TWICE A DAY   traMADol  (ULTRAM ) 50 MG tablet Take 1 tablet (50 mg total) by mouth every 6 (six) hours as needed. for pain   triamcinolone  cream (KENALOG ) 0.5 % Apply 1 application topically 2 (two) times daily.   TRUEplus Lancets 28G MISC TEST BLOOD SUGAR EVERY DAY   Facility-Administered Encounter Medications as of 05/08/2023  Medication   cyanocobalamin  (VITAMIN B12) injection 1,000 mcg   [COMPLETED] triamcinolone  acetonide (KENALOG -40) injection 40 mg

## 2023-05-08 ENCOUNTER — Ambulatory Visit (INDEPENDENT_AMBULATORY_CARE_PROVIDER_SITE_OTHER): Admitting: Family Medicine

## 2023-05-08 ENCOUNTER — Ambulatory Visit
Admission: RE | Admit: 2023-05-08 | Discharge: 2023-05-08 | Disposition: A | Discharge status: Home / Self Care | Source: Ambulatory Visit | Attending: Family Medicine | Admitting: Family Medicine

## 2023-05-08 VITALS — BP 102/64 | HR 94 | Temp 97.9°F | Ht <= 58 in | Wt 117.0 lb

## 2023-05-08 DIAGNOSIS — G8929 Other chronic pain: Secondary | ICD-10-CM

## 2023-05-08 DIAGNOSIS — M1711 Unilateral primary osteoarthritis, right knee: Secondary | ICD-10-CM

## 2023-05-08 DIAGNOSIS — M25512 Pain in left shoulder: Secondary | ICD-10-CM

## 2023-05-08 DIAGNOSIS — M19012 Primary osteoarthritis, left shoulder: Secondary | ICD-10-CM | POA: Diagnosis not present

## 2023-05-08 MED ORDER — TRIAMCINOLONE ACETONIDE 40 MG/ML IJ SUSP
40.0000 mg | Freq: Once | INTRAMUSCULAR | Status: AC
  Administered 2023-05-08: 40 mg via INTRA_ARTICULAR

## 2023-05-09 ENCOUNTER — Encounter: Payer: Self-pay | Admitting: Family Medicine

## 2023-05-09 ENCOUNTER — Telehealth: Payer: Self-pay

## 2023-05-09 NOTE — Telephone Encounter (Signed)
 Referral faxed to [facility] for pt to re-establish care. Re: IDA. Fax confirmation received.   Fax: [phone]

## 2023-05-13 ENCOUNTER — Inpatient Hospital Stay

## 2023-05-13 ENCOUNTER — Encounter: Payer: Self-pay | Admitting: Urgent Care

## 2023-05-14 ENCOUNTER — Inpatient Hospital Stay

## 2023-05-14 ENCOUNTER — Encounter: Payer: Self-pay | Admitting: Urgent Care

## 2023-05-14 VITALS — BP 123/74 | HR 81 | Temp 97.8°F | Resp 18

## 2023-05-14 DIAGNOSIS — Z86000 Personal history of in-situ neoplasm of breast: Secondary | ICD-10-CM | POA: Diagnosis not present

## 2023-05-14 DIAGNOSIS — D509 Iron deficiency anemia, unspecified: Secondary | ICD-10-CM | POA: Diagnosis not present

## 2023-05-14 DIAGNOSIS — E538 Deficiency of other specified B group vitamins: Secondary | ICD-10-CM

## 2023-05-14 DIAGNOSIS — Z9012 Acquired absence of left breast and nipple: Secondary | ICD-10-CM | POA: Diagnosis not present

## 2023-05-14 DIAGNOSIS — M810 Age-related osteoporosis without current pathological fracture: Secondary | ICD-10-CM | POA: Diagnosis not present

## 2023-05-14 DIAGNOSIS — Z79899 Other long term (current) drug therapy: Secondary | ICD-10-CM | POA: Diagnosis not present

## 2023-05-14 MED ORDER — IRON SUCROSE 20 MG/ML IV SOLN
200.0000 mg | Freq: Once | INTRAVENOUS | Status: AC
Start: 2023-05-14 — End: 2023-05-14
  Administered 2023-05-14: 200 mg via INTRAVENOUS
  Filled 2023-05-14: qty 10

## 2023-05-20 ENCOUNTER — Inpatient Hospital Stay

## 2023-05-20 ENCOUNTER — Encounter: Payer: Self-pay | Admitting: Urgent Care

## 2023-05-20 VITALS — BP 148/67 | HR 69 | Temp 96.0°F | Resp 18

## 2023-05-20 DIAGNOSIS — Z9012 Acquired absence of left breast and nipple: Secondary | ICD-10-CM | POA: Diagnosis not present

## 2023-05-20 DIAGNOSIS — E538 Deficiency of other specified B group vitamins: Secondary | ICD-10-CM

## 2023-05-20 DIAGNOSIS — Z79899 Other long term (current) drug therapy: Secondary | ICD-10-CM | POA: Diagnosis not present

## 2023-05-20 DIAGNOSIS — D509 Iron deficiency anemia, unspecified: Secondary | ICD-10-CM | POA: Diagnosis not present

## 2023-05-20 DIAGNOSIS — Z86000 Personal history of in-situ neoplasm of breast: Secondary | ICD-10-CM | POA: Diagnosis not present

## 2023-05-20 DIAGNOSIS — M810 Age-related osteoporosis without current pathological fracture: Secondary | ICD-10-CM | POA: Diagnosis not present

## 2023-05-20 MED ORDER — IRON SUCROSE 20 MG/ML IV SOLN
200.0000 mg | Freq: Once | INTRAVENOUS | Status: AC
  Administered 2023-05-20: 200 mg via INTRAVENOUS

## 2023-05-20 MED ORDER — SODIUM CHLORIDE 0.9% FLUSH
10.0000 mL | Freq: Once | INTRAVENOUS | Status: AC | PRN
  Administered 2023-05-20: 10 mL
  Filled 2023-05-20: qty 10

## 2023-05-28 ENCOUNTER — Inpatient Hospital Stay: Attending: Oncology

## 2023-05-28 ENCOUNTER — Inpatient Hospital Stay: Payer: Medicare PPO

## 2023-05-28 DIAGNOSIS — D509 Iron deficiency anemia, unspecified: Secondary | ICD-10-CM | POA: Insufficient documentation

## 2023-05-28 DIAGNOSIS — E538 Deficiency of other specified B group vitamins: Secondary | ICD-10-CM | POA: Insufficient documentation

## 2023-05-28 MED ORDER — CYANOCOBALAMIN 1000 MCG/ML IJ SOLN
1000.0000 ug | Freq: Once | INTRAMUSCULAR | Status: AC
  Administered 2023-05-28: 1000 ug via INTRAMUSCULAR
  Filled 2023-05-28: qty 1

## 2023-05-29 ENCOUNTER — Other Ambulatory Visit: Payer: Self-pay | Admitting: Family Medicine

## 2023-06-02 DIAGNOSIS — H401133 Primary open-angle glaucoma, bilateral, severe stage: Secondary | ICD-10-CM | POA: Diagnosis not present

## 2023-06-09 DIAGNOSIS — H18513 Endothelial corneal dystrophy, bilateral: Secondary | ICD-10-CM | POA: Diagnosis not present

## 2023-06-09 DIAGNOSIS — H04123 Dry eye syndrome of bilateral lacrimal glands: Secondary | ICD-10-CM | POA: Diagnosis not present

## 2023-06-09 DIAGNOSIS — Z0100 Encounter for examination of eyes and vision without abnormal findings: Secondary | ICD-10-CM | POA: Diagnosis not present

## 2023-06-09 DIAGNOSIS — H353132 Nonexudative age-related macular degeneration, bilateral, intermediate dry stage: Secondary | ICD-10-CM | POA: Diagnosis not present

## 2023-06-09 LAB — HM DIABETES EYE EXAM

## 2023-06-13 ENCOUNTER — Encounter: Payer: Self-pay | Admitting: Urgent Care

## 2023-06-17 ENCOUNTER — Ambulatory Visit: Admitting: Podiatry

## 2023-06-20 ENCOUNTER — Encounter: Payer: Self-pay | Admitting: Podiatry

## 2023-06-20 ENCOUNTER — Ambulatory Visit: Admitting: Podiatry

## 2023-06-20 VITALS — Ht <= 58 in | Wt 117.0 lb

## 2023-06-20 DIAGNOSIS — M79675 Pain in left toe(s): Secondary | ICD-10-CM

## 2023-06-20 DIAGNOSIS — B351 Tinea unguium: Secondary | ICD-10-CM

## 2023-06-20 DIAGNOSIS — M79674 Pain in right toe(s): Secondary | ICD-10-CM | POA: Diagnosis not present

## 2023-06-20 NOTE — Progress Notes (Signed)
   Chief Complaint  Patient presents with   Nail Problem    Pt is here for RFC.    SUBJECTIVE Patient presents to office today complaining of elongated, thickened nails that cause pain while ambulating in shoes.  Patient is unable to trim their own nails. Patient is here for further evaluation and treatment.  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  Mastectomy/ SLN.   Chest pain    a. 10/2017 Cath: Nl cors.   Chronic headache    Chronic heart failure with preserved ejection fraction (HFpEF) (HCC)    a. 10/2021 Echo: EF 60-65%, no rwma, mild LVH, GrI DD, nl RV size/fxn, mild MR.   Complication of anesthesia    prior to 1991 used to have PONV.  none recently.   Diverticulosis of colon    Family history of adverse reaction to anesthesia    mother/daighter get sick   GERD with stricture    Hard of hearing    Hiatal hernia    Hypokalemia    Labile hypertension    a. 10/2017 Cath: nl renal arteries.   Mitral regurgitation    a. 10/2021 Echo: Mild MR.   Mixed hyperlipidemia    Neuropathy    feet   Osteoporosis    Pre-diabetes    Pulmonary hypertension (HCC)    a. 10/2017 Echo: PASP 35-2mmHg.   Wears dentures    full upper    Allergies  Allergen Reactions   Oxycodone  Nausea Only   Sulfa Antibiotics Rash    Childhood reaction   Sulfonamide Derivatives Rash    Childhood reaction.     OBJECTIVE General Patient is awake, alert, and oriented x 3 and in no acute distress. Derm Skin is dry and supple bilateral. Negative open lesions or macerations. Remaining integument unremarkable. Nails are tender, long, thickened and dystrophic with subungual debris, consistent with onychomycosis, 1-5 bilateral. No signs of infection noted. Vasc  DP and PT pedal pulses palpable bilaterally. Temperature gradient within normal limits.  Neuro Epicritic and protective threshold sensation  grossly intact bilaterally.  Musculoskeletal Exam No symptomatic pedal deformities noted bilateral. Muscular strength within normal limits.  ASSESSMENT 1.  Pain due to onychomycosis of toenails both  PLAN OF CARE 1. Patient evaluated today.  2. Instructed to maintain good pedal hygiene and foot care.  3. Mechanical debridement of nails 1-5 bilaterally performed using a nail nipper. Filed with dremel without incident.  4. Return to clinic in 3 mos.    Dot Gazella, DPM [facility]  Dr. Dot Gazella, DPM    [address] [place], [place] [address]                Office [phone]  Fax [phone]

## 2023-06-24 ENCOUNTER — Telehealth: Payer: Self-pay | Admitting: Family Medicine

## 2023-06-24 NOTE — Telephone Encounter (Signed)
 Copied from CRM [id number]. Topic: Appointments - Scheduling Inquiry for Clinic >> Jun 24, 2023 12:46 PM Martinique E wrote: Reason for CRM: Patient called wanting to schedule a cortisone injection for her left arm. Agent unsure if these injections have to be done on certain days. Callback number for patient is [phone].

## 2023-06-24 NOTE — Telephone Encounter (Signed)
 Spoke with [name] and scheduled her with Dr. Geralyn Knee for 06/30/2023 at 3:40 pm.

## 2023-06-27 ENCOUNTER — Encounter: Payer: Self-pay | Admitting: Urgent Care

## 2023-06-27 ENCOUNTER — Other Ambulatory Visit: Payer: Self-pay | Admitting: Family Medicine

## 2023-06-27 NOTE — Telephone Encounter (Signed)
 Last office visit 05/08/2023 for Right Knee Pain/Left Shoulder Pain with Dr. Geralyn Knee.  Last refilled 02/06/2023 for #60 with 1 refill.  Next appt: 06/30/2023 with Dr. Geralyn Knee for shoulder injection.

## 2023-06-30 ENCOUNTER — Inpatient Hospital Stay: Attending: Oncology

## 2023-06-30 ENCOUNTER — Encounter: Payer: Self-pay | Admitting: Family Medicine

## 2023-06-30 ENCOUNTER — Ambulatory Visit (INDEPENDENT_AMBULATORY_CARE_PROVIDER_SITE_OTHER): Admitting: Family Medicine

## 2023-06-30 ENCOUNTER — Encounter: Payer: Self-pay | Admitting: Urgent Care

## 2023-06-30 ENCOUNTER — Inpatient Hospital Stay: Payer: Medicare PPO

## 2023-06-30 VITALS — BP 120/80 | HR 71 | Temp 97.8°F | Ht <= 58 in | Wt 119.2 lb

## 2023-06-30 DIAGNOSIS — G8929 Other chronic pain: Secondary | ICD-10-CM

## 2023-06-30 DIAGNOSIS — D509 Iron deficiency anemia, unspecified: Secondary | ICD-10-CM | POA: Insufficient documentation

## 2023-06-30 DIAGNOSIS — M1711 Unilateral primary osteoarthritis, right knee: Secondary | ICD-10-CM

## 2023-06-30 DIAGNOSIS — M25512 Pain in left shoulder: Secondary | ICD-10-CM

## 2023-06-30 DIAGNOSIS — E538 Deficiency of other specified B group vitamins: Secondary | ICD-10-CM | POA: Diagnosis not present

## 2023-06-30 MED ORDER — CYANOCOBALAMIN 1000 MCG/ML IJ SOLN
1000.0000 ug | Freq: Once | INTRAMUSCULAR | Status: AC
  Administered 2023-06-30: 1000 ug via INTRAMUSCULAR
  Filled 2023-06-30: qty 1

## 2023-06-30 MED ORDER — TRIAMCINOLONE ACETONIDE 40 MG/ML IJ SUSP
40.0000 mg | Freq: Once | INTRAMUSCULAR | Status: AC
Start: 2023-06-30 — End: 2023-06-30
  Administered 2023-06-30: 40 mg via INTRA_ARTICULAR

## 2023-06-30 NOTE — Progress Notes (Unsigned)
 Joellen Tullos T. Alenah Sarria, MD, CAQ Sports Medicine [facility] at [facility] [address] [place], [address]  Phone: [phone]  FAX: [phone]  Ashley Ryan - 78 y.o. female  MRN [id number]  Date of Birth: 1945/07/29  Date: 06/30/2023  PCP: Judithann Novas, MD  Referral: Judithann Novas, MD  Chief Complaint  Patient presents with   Shoulder Pain    Left   Knee Pain    Right-Last injection didn't help her at all Wants a knee brace   Subjective:   Ashley Ryan is a 78 y.o. very pleasant female patient with Body mass index is 24.92 kg/m. who presents with the following:  Patient presents for follow-up shoulder pain and also some discussion regarding right-sided knee pain.  She had some question regarding her left-sided shoulder pain and her imaging.  We reviewed these again together.  She does have some mild arthritic changes.  Radiology calls into question some patchy sclerosis at the superomedial left humeral head.  I agree that this is nonspecific.  There is no collapse.  Avascular necrosis cannot be excluded.  She also has some significant pain in the right and she has advanced knee arthritis worse in the lateral compartment.  She did have a very good response to Monovisc injections in her right knee in the past    Review of Systems is noted in the HPI, as appropriate  Objective:   BP 120/80   Pulse 71   Temp 97.8 F (36.6 C) (Temporal)   Ht 4\' 10"  (1.473 m)   Wt 119 lb 4 oz (54.1 kg)   SpO2 98%   BMI 24.92 kg/m   GEN: No acute distress; alert,appropriate. PULM: Breathing comfortably in no respiratory distress PSYCH: Normally interactive.   Right knee: Medial and lateral joint none tenderness is quite significant.  She lacks 2 degrees of extension and flexion to 100 Mild effusion  Left shoulder with some mild tenderness at the [employer] joint, bicipital groove and some pain with palpation at the posterior humeral head Range of motion  is to 170 bilaterally Internal and external range of motion is full Strength is 4+/5 throughout Otherwise neurovascularly intact  Laboratory and Imaging Data:  Assessment and Plan:     ICD-10-CM   1. Chronic left shoulder pain  M25.512 triamcinolone  acetonide (KENALOG -40) injection 40 mg   G89.29     2. Primary osteoarthritis of right knee  M17.11      Chronic left shoulder pain with some mild arthritic changes.  Mild sclerosis and a small portion of the humeral head on plain x-ray.  Avascular necrosis cannot fully be excluded, and noted as a nonspecific finding on formal radiograph read.  We could consider MRI of the shoulder to more clearly define if worsens.  Right knee arthritis.  She did not have a great response to her last corticosteroid injection.  She did have a favorable response to Monovisc injection in the past.  We will check her viscosupplementation benefits, and we can schedule a follow-up right-sided knee Visco injection.  Intraarticular Shoulder Aspiration/Injection Procedure Note Ashley Ryan [birth year] Date of procedure: 06/30/2023  Procedure: Large Joint Aspiration / Injection of Shoulder, Intraarticular, L Indications: Pain  Procedure Details Verbal consent was obtained from the patient. Risks explained and contrasted with benefits and alternatives. Patient prepped with Chloraprep and Ethyl Chloride used for anesthesia. An intraarticular shoulder injection was performed using the posterior approach; needle placed into joint capsule without  difficulty. The patient tolerated the procedure well and had decreased pain post injection. No complications. Injection: 9 cc of Lidocaine  1% and 1 mL Kenalog  40 mg. Needle: 21 gauge, 2 inch   Medication Management during today's office visit: Meds ordered this encounter  Medications   triamcinolone  acetonide (KENALOG -40) injection 40 mg   There are no discontinued medications.  Orders placed today for conditions  managed today: No orders of the defined types were placed in this encounter.   Disposition: No follow-ups on file.  Dragon [employer] One speech-to-text software was used for transcription in this dictation.  Possible transcriptional errors can occur using Animal nutritionist.   Signed,  Ranny Bye. Brent Taillon, MD   Outpatient Encounter Medications as of 06/30/2023  Medication Sig   Alcohol Swabs (DROPSAFE ALCOHOL PREP) 70 % PADS USE  TO TEST BLOOD SUGAR ONE TIME DAILY   amLODipine  (NORVASC ) 5 MG tablet TAKE 1 TABLET (5 MG TOTAL) BY MOUTH DAILY.   atorvastatin  (LIPITOR) 40 MG tablet TAKE 1 TABLET AT BEDTIME   Blood Glucose Calibration (ACCU-CHEK AVIVA) SOLN Use to check blood sugar once daily   Blood Glucose Calibration (TRUE METRIX LEVEL 1) Low SOLN USE AS DIRECTED   Blood Glucose Monitoring Suppl (TRUE METRIX AIR GLUCOSE METER) w/Device KIT Use to check blood sugar daily   brimonidine  (ALPHAGAN ) 0.2 % ophthalmic solution brimonidine  0.2 % eye drops  INSTILL 1 DROP INTO BOTH EYES TWICE A DAY   Calcium  Carb-Cholecalciferol  (CALCIUM  600 + D) 600-5 MG-MCG TABS Take 2 tablets by mouth daily.   diclofenac  (VOLTAREN ) 75 MG EC tablet TAKE 1 TABLET BY MOUTH TWICE A DAY   diclofenac  Sodium (VOLTAREN ) 1 % GEL APPLY 2 GRAMS TOPICALLY TO THE AFFECTED AREA 4 TIMES DAILY.   esomeprazole  (NEXIUM ) 40 MG capsule TAKE 1 CAPSULE TWICE DAILY BEFORE MEALS   fluticasone  (FLONASE ) 50 MCG/ACT nasal spray USE 2 SPRAYS INTO THE NOSE DAILY AS DIRECTED (SUBSTITUTED FOR FLONASE )   furosemide  (LASIX ) 20 MG tablet TAKE 1 TABLET BY MOUTH EVERY DAY AS NEEDED   gabapentin  (NEURONTIN ) 100 MG capsule TAKE 1 CAPSULE EVERY MORNING , 1 CAPSULE AT LUNCH, 1 CAPSULE AT DINNER, AND 2-3 CAPSULES AT BEDTIME   Glucosamine 500 MG TABS Take 500 mg by mouth daily.   glucose blood (TRUE METRIX BLOOD GLUCOSE TEST) test strip Use to check blood sugar daily   Iron -Vitamin C  65-125 MG TABS Take 1 tablet by mouth daily at 2 PM.   isosorbide   mononitrate (IMDUR ) 30 MG 24 hr tablet TAKE 1 TABLET EVERY DAY   latanoprost  (XALATAN ) 0.005 % ophthalmic solution Place 1 drop into both eyes at bedtime.   lidocaine  (LIDODERM ) 5 % APPLY 1 PATCH ONTO THE SKIN EVERY DAY. REMOVE AND DISCARD PATCH WITHIN 12 HOURS OR AS DIRECTED BY PHYSICIAN   losartan -hydrochlorothiazide  (HYZAAR) 100-25 MG tablet Take 1 tablet by mouth daily.   mirabegron  ER (MYRBETRIQ ) 25 MG TB24 tablet Take 1 tablet (25 mg total) by mouth daily.   Omega-3 Fatty Acids (FISH OIL) 1000 MG CAPS Take 1,000 mg by mouth daily.    polycarbophil (FIBERCON) 625 MG tablet Take 625 mg by mouth daily.   potassium chloride  SA (KLOR-CON  M20) 20 MEQ tablet TAKE 1 TABLET BY MOUTH TWICE A DAY   traMADol  (ULTRAM ) 50 MG tablet Take 1 tablet (50 mg total) by mouth every 6 (six) hours as needed. for pain   triamcinolone  cream (KENALOG ) 0.5 % Apply 1 application topically 2 (two) times daily.   TRUEplus Lancets 28G  MISC TEST BLOOD SUGAR EVERY DAY   Facility-Administered Encounter Medications as of 06/30/2023  Medication   cyanocobalamin  (VITAMIN B12) injection 1,000 mcg   [COMPLETED] triamcinolone  acetonide (KENALOG -40) injection 40 mg

## 2023-07-01 ENCOUNTER — Ambulatory Visit: Admitting: Podiatry

## 2023-07-01 ENCOUNTER — Telehealth: Payer: Self-pay

## 2023-07-01 ENCOUNTER — Encounter: Payer: Self-pay | Admitting: Family Medicine

## 2023-07-01 NOTE — Telephone Encounter (Signed)
 Patient has been approved for Monovisc gel injections for the right knee.

## 2023-07-01 NOTE — Telephone Encounter (Signed)
 Benefit investigation for Monovisc gel injection of the right knee submitted. Will notify provider when approval/non-approval is received.

## 2023-07-02 NOTE — Telephone Encounter (Signed)
Can you let her know

## 2023-07-02 NOTE — Telephone Encounter (Signed)
 Monovisc $2975.00 each.  So patient would be responsible for full amount.

## 2023-07-02 NOTE — Telephone Encounter (Signed)
 I can't tell if this means that she is covered at 100% or she is not until she fulfills her out of pocket max.

## 2023-07-02 NOTE — Telephone Encounter (Signed)
 Yes, she will be covered 100% once her out of pocket is met. She has met $1009.23 of her $9350 max. So she will be responsible for the cost of the injection. My apologies for not adding this to the note. Hope this helps.

## 2023-07-02 NOTE — Telephone Encounter (Signed)
 Ashley Ryan notified of insurance benefits for the Monovisc Injections.  Due to cost,  patient does not wish to move forward with these at this time.

## 2023-07-11 ENCOUNTER — Encounter: Payer: Self-pay | Admitting: Medical

## 2023-07-11 ENCOUNTER — Encounter: Payer: Self-pay | Admitting: Urgent Care

## 2023-07-11 ENCOUNTER — Ambulatory Visit: Attending: Medical | Admitting: Medical

## 2023-07-11 VITALS — BP 134/82 | HR 73 | Ht 59.0 in | Wt 117.6 lb

## 2023-07-11 DIAGNOSIS — I10 Essential (primary) hypertension: Secondary | ICD-10-CM

## 2023-07-11 DIAGNOSIS — E876 Hypokalemia: Secondary | ICD-10-CM | POA: Diagnosis not present

## 2023-07-11 DIAGNOSIS — I25118 Atherosclerotic heart disease of native coronary artery with other forms of angina pectoris: Secondary | ICD-10-CM

## 2023-07-11 DIAGNOSIS — E782 Mixed hyperlipidemia: Secondary | ICD-10-CM | POA: Diagnosis not present

## 2023-07-11 DIAGNOSIS — I5032 Chronic diastolic (congestive) heart failure: Secondary | ICD-10-CM

## 2023-07-11 NOTE — Progress Notes (Signed)
 Cardiology Office Note   Date:  07/11/2023  ID:  Ashley Ryan, Ashley Ryan 1945-10-26, MRN [id number] PCP: Judithann Novas, MD  [facility] Providers Cardiologist:  Sammy Crisp, MD    History of Present Illness Ashley Ryan is a 78 y.o. female  with a hx of HFpEF, mild pulmonary hypertension, systemic hypertension, nonobstructive CAD, hiatal hernia, prediabetes, anemia, and arthritis who presents for 6 month follow-up.   Left heart cath in 2019 showed no significant CAD, mildly elevated LV filling pressures consistent with diastolic dysfunction, normal bilateral renal arteries.  Echo in 2023 showed EF of 60 to 65%, mild LVH, grade 1 diastolic dysfunction, mild MR.  The patient was last seen 11/2022 and was stable from a cardiac perspective.   Today, the patient reports she is stable from a cardiac perspective. She is still working [employer]. She has arthritis, knee pain in the right knee. She has occasional SOB when she feels blood pressure is off. She denies chest pain. She denies lower leg edema, orthopnea, or pnd. No lightheadedness or dizziness.    Studies Reviewed EKG Interpretation Date/Time:  Friday July 11 2023 08:19:37 EDT Ventricular Rate:  73 PR Interval:  178 QRS Duration:  80 QT Interval:  358 QTC Calculation: 394 R Axis:   16  Text Interpretation: Normal sinus rhythm Possible Left atrial enlargement When compared with ECG of 05-Dec-2022 15:13, No significant change was found Confirmed by Gennaro Khat, Algie Cales ([id number]) on 07/11/2023 8:24:15 AM    Echo 2023 1. Left ventricular ejection fraction, by estimation, is 60 to 65%. The  left ventricle has normal function. The left ventricle has no regional  wall motion abnormalities. There is mild left ventricular hypertrophy.  Left ventricular diastolic parameters  are consistent with Grade I diastolic dysfunction (impaired relaxation).   2. Right ventricular systolic function is normal. The right ventricular  size is  normal. Tricuspid regurgitation signal is inadequate for assessing  PA pressure.   3. The mitral valve is normal in structure. Mild mitral valve  regurgitation. No evidence of mitral stenosis.   4. The aortic valve is tricuspid. Aortic valve regurgitation is not  visualized. No aortic stenosis is present.   5. The inferior vena cava is normal in size with greater than 50%  respiratory variability, suggesting right atrial pressure of 3 mmHg.    [facility] 2019 Conclusions: No angiographically significant coronary artery disease. Mildly elevated left ventricular filling pressure, consistent with diastolic dysfunction (normal LVEF by echo yesterday). Normal bilateral renal arteries.   Recommendations: Continue isosorbide  mononitrate for possible component of microvascular dysfunction. Consider evaluation for non-cardiac etiologies of chest pain, including GI workup. Restart furosemide  20 mg daily tomorrow. Ok for discharge home this afternoon if no bleeding or vascular injury at right femoral arteriotomy site.  Patient should follow-up in our office in ~2 weeks.   No indication for antiplatelet therapy at this time.    Sammy Crisp, MD [facility] Pager: [phone]   Echo 2019 Study Conclusions   - Left ventricle: The cavity size was normal. Wall thickness was    increased in a pattern of moderate LVH. Systolic function was    normal. The estimated ejection fraction was in the range of 55%    to 60%. Wall motion was normal; there were no regional wall    motion abnormalities. Doppler parameters are consistent with    abnormal left ventricular relaxation (grade 1 diastolic    dysfunction).  - Aortic valve: There was trivial regurgitation.  -  Left atrium: The atrium was mildly dilated.  - Right ventricle: The cavity size was normal. Systolic function    was normal.  - Pulmonary arteries: Systolic pressure was mildly increased, in    the range of 35 mm Hg to 40 mm Hg.         Physical Exam VS:  BP 134/82   Pulse 73   Ht 4' 11 (1.499 m)   Wt 117 lb 9.6 oz (53.3 kg)   SpO2 99%   BMI 23.75 kg/m        Wt Readings from Last 3 Encounters:  07/11/23 117 lb 9.6 oz (53.3 kg)  06/30/23 119 lb 4 oz (54.1 kg)  06/20/23 117 lb (53.1 kg)    GEN: Well nourished, well developed in no acute distress NECK: No JVD; No carotid bruits CARDIAC: RRR, no murmurs, rubs, gallops RESPIRATORY:  Clear to auscultation without rales, wheezing or rhonchi  ABDOMEN: Soft, non-tender, non-distended EXTREMITIES:  No edema; No deformity   ASSESSMENT AND PLAN  CAD The patient denies chest pain. She has times when she feels like she needs to take a deep breath, but says this is mostly when he blood pressure is labile. No further ischemic work-up indicated at this time. Continue Imdur , Lipitor, and amlodipine .   HTN BP is normal. Continue amlodipne 5mg  daily, Imdur  30mg  daily, Losartan -hydrochlorothiazide  100-25mg  daily.  HFpEF The patient is euvolemic on exam. She takes lasix  as needed. Continue Losartan -hydrochlorothiazide , potassium supplementation and lasix  as needed.   Hypokalemia Chronic low potassium followed by Dr. Wilhelmenia Harada.   HLD LDL 77. Continue Lipitor 40mg  daily.         Dispo: Follow-up in 6 months  Signed, Brylie Sneath Rebekah Canada, PA-C

## 2023-07-11 NOTE — Patient Instructions (Signed)

## 2023-07-30 ENCOUNTER — Inpatient Hospital Stay: Payer: Medicare PPO | Attending: Oncology

## 2023-08-28 ENCOUNTER — Other Ambulatory Visit: Payer: Self-pay | Admitting: Family Medicine

## 2023-08-28 NOTE — Telephone Encounter (Signed)
 Last office visit 06/30/23 for left shoulder pain with Dr. Watt.  Last refilled 06/27/23 for #60 with 1 refill.  Next appt: 09/16/23 for DM.

## 2023-09-01 ENCOUNTER — Encounter: Payer: Self-pay | Admitting: Urgent Care

## 2023-09-01 ENCOUNTER — Inpatient Hospital Stay: Payer: Medicare PPO | Attending: Oncology

## 2023-09-01 DIAGNOSIS — E538 Deficiency of other specified B group vitamins: Secondary | ICD-10-CM | POA: Insufficient documentation

## 2023-09-01 DIAGNOSIS — D509 Iron deficiency anemia, unspecified: Secondary | ICD-10-CM | POA: Insufficient documentation

## 2023-09-01 MED ORDER — CYANOCOBALAMIN 1000 MCG/ML IJ SOLN
1000.0000 ug | Freq: Once | INTRAMUSCULAR | Status: AC
  Administered 2023-09-01 (×2): 1000 ug via INTRAMUSCULAR
  Filled 2023-09-01: qty 1

## 2023-09-03 ENCOUNTER — Ambulatory Visit
Admission: RE | Admit: 2023-09-03 | Discharge: 2023-09-03 | Disposition: A | Discharge status: Home / Self Care | Source: Ambulatory Visit | Attending: Oncology | Admitting: Oncology

## 2023-09-03 DIAGNOSIS — D0512 Intraductal carcinoma in situ of left breast: Secondary | ICD-10-CM

## 2023-09-03 DIAGNOSIS — Z853 Personal history of malignant neoplasm of breast: Secondary | ICD-10-CM | POA: Diagnosis not present

## 2023-09-03 DIAGNOSIS — Z1231 Encounter for screening mammogram for malignant neoplasm of breast: Secondary | ICD-10-CM | POA: Diagnosis not present

## 2023-09-09 ENCOUNTER — Encounter: Payer: Self-pay | Admitting: Urgent Care

## 2023-09-15 DIAGNOSIS — R5382 Chronic fatigue, unspecified: Secondary | ICD-10-CM | POA: Diagnosis not present

## 2023-09-15 DIAGNOSIS — K257 Chronic gastric ulcer without hemorrhage or perforation: Secondary | ICD-10-CM | POA: Diagnosis not present

## 2023-09-15 DIAGNOSIS — Z8719 Personal history of other diseases of the digestive system: Secondary | ICD-10-CM | POA: Diagnosis not present

## 2023-09-15 DIAGNOSIS — D508 Other iron deficiency anemias: Secondary | ICD-10-CM | POA: Diagnosis not present

## 2023-09-15 DIAGNOSIS — K5909 Other constipation: Secondary | ICD-10-CM | POA: Diagnosis not present

## 2023-09-15 DIAGNOSIS — R1314 Dysphagia, pharyngoesophageal phase: Secondary | ICD-10-CM | POA: Diagnosis not present

## 2023-09-15 DIAGNOSIS — K219 Gastro-esophageal reflux disease without esophagitis: Secondary | ICD-10-CM | POA: Diagnosis not present

## 2023-09-15 DIAGNOSIS — Z860101 Personal history of adenomatous and serrated colon polyps: Secondary | ICD-10-CM | POA: Diagnosis not present

## 2023-09-15 DIAGNOSIS — Z599 Problem related to housing and economic circumstances, unspecified: Secondary | ICD-10-CM | POA: Diagnosis not present

## 2023-09-16 ENCOUNTER — Ambulatory Visit: Payer: Self-pay | Admitting: Family Medicine

## 2023-09-16 ENCOUNTER — Ambulatory Visit: Payer: Medicare PPO | Admitting: Family Medicine

## 2023-09-16 VITALS — BP 140/86 | HR 71 | Temp 97.7°F | Ht 59.0 in | Wt 119.2 lb

## 2023-09-16 DIAGNOSIS — E1140 Type 2 diabetes mellitus with diabetic neuropathy, unspecified: Secondary | ICD-10-CM

## 2023-09-16 DIAGNOSIS — I152 Hypertension secondary to endocrine disorders: Secondary | ICD-10-CM

## 2023-09-16 DIAGNOSIS — E1159 Type 2 diabetes mellitus with other circulatory complications: Secondary | ICD-10-CM

## 2023-09-16 DIAGNOSIS — E1142 Type 2 diabetes mellitus with diabetic polyneuropathy: Secondary | ICD-10-CM

## 2023-09-16 LAB — POCT GLYCOSYLATED HEMOGLOBIN (HGB A1C): Hemoglobin A1C: 5.6 % (ref 4.0–5.6)

## 2023-09-16 LAB — MICROALBUMIN / CREATININE URINE RATIO
Creatinine,U: 146.1 mg/dL
Microalb Creat Ratio: 13.3 mg/g (ref 0.0–30.0)
Microalb, Ur: 1.9 mg/dL (ref 0.0–1.9)

## 2023-09-16 LAB — HM DIABETES FOOT EXAM

## 2023-09-16 MED ORDER — TRAMADOL HCL 50 MG PO TABS: 50.0000 mg | ORAL_TABLET | Freq: Four times a day (QID) | ORAL | 0 refills | Status: DC | PRN

## 2023-09-16 NOTE — Assessment & Plan Note (Signed)
 Chronic, diet controlled. Associated with neuropathy.

## 2023-09-16 NOTE — Assessment & Plan Note (Addendum)
 Associated with diabetes.   Moderate  controlled with current gabapentin  regimen.. will try adding ALA 600 mg daily

## 2023-09-16 NOTE — Patient Instructions (Signed)
 Can try ALA 600 mg daily for neuropathy.

## 2023-09-16 NOTE — Progress Notes (Signed)
 The   Patient ID: Ashley Ryan, female    DOB: Apr 05, 1945, 78 y.o.   MRN: [id number]  This visit was conducted in person.  BP (!) 140/80   Pulse 71   Temp 97.7 F (36.5 C) (Temporal)   Ht 4' 11 (1.499 m)   Wt 119 lb 4 oz (54.1 kg)   SpO2 98%   BMI 24.09 kg/m    CC:  Chief Complaint  Patient presents with   Diabetes    Subjective:   HPI: Ashley Ryan is a 78 y.o. female presenting on 09/16/2023 for Diabetes   Dr Babara recommended upper and lower GI to look into chronic anemai.   Diabetes: Chronic, stable diet controlled. Lab Results  Component Value Date   HGBA1C 5.6 09/16/2023  Using medications without difficulties: Hypoglycemic episodes: Hyperglycemic episodes: Feet problems:none, has peripheral neuropathy.. moderate control, trying to limit gabapentin . Blood Sugars averaging: Not checking eye exam within last year: Yes Due for uACR  Using tramadol  porn joint and neuropathy pain.  High cholesterol: LDL at goal less than 70 given coronary artery disease at last check on atorvastatin  40 mg daily Lab Results  Component Value Date   CHOL 150 02/18/2023   HDL 55.40 02/18/2023   LDLCALC 77 02/18/2023   LDLDIRECT 102.5 07/21/2009   TRIG 87.0 02/18/2023   CHOLHDL 3 02/18/2023   No exercise  Diet: moderate     Hypertension: Well-controlled on current regimen of amlodipine  5 mg daily, Imdur  30 mg daily and losartan  hydrochlorothiazide  100/25 mg p.o. daily BP Readings from Last 3 Encounters:  09/16/23 (!) 140/80  07/11/23 134/82  06/30/23 120/80  Using medication without problems or lightheadedness:  none Chest pain with exertion: none Edema:  intermittent Short of breath: none Average home BPs:  < 140/90 Other issues:  HFpEF stable, CAD, mild mitral valve regurgitation, labile hypertension, pulmonary hypertension: Dr End follows.  Euvolemic in office using Lasix   20 mg as needed. Reviewed last office visit from December 05, 2022   Relevant past medical,  surgical, family and social history reviewed and updated as indicated. Interim medical history since our last visit reviewed. Allergies and medications reviewed and updated. Outpatient Medications Prior to Visit  Medication Sig Dispense Refill   Alcohol Swabs (DROPSAFE ALCOHOL PREP) 70 % PADS USE  TO TEST BLOOD SUGAR ONE TIME DAILY 100 each 3   amLODipine  (NORVASC ) 5 MG tablet TAKE 1 TABLET (5 MG TOTAL) BY MOUTH DAILY. 90 tablet 3   atorvastatin  (LIPITOR) 40 MG tablet TAKE 1 TABLET AT BEDTIME 90 tablet 3   Blood Glucose Calibration (ACCU-CHEK AVIVA) SOLN Use to check blood sugar once daily 1 each 0   Blood Glucose Calibration (TRUE METRIX LEVEL 1) Low SOLN USE AS DIRECTED 1 each 3   Blood Glucose Monitoring Suppl (TRUE METRIX AIR GLUCOSE METER) w/Device KIT Use to check blood sugar daily 1 kit 0   brimonidine  (ALPHAGAN ) 0.2 % ophthalmic solution brimonidine  0.2 % eye drops  INSTILL 1 DROP INTO BOTH EYES TWICE A DAY     Calcium  Carb-Cholecalciferol  (CALCIUM  600 + D) 600-5 MG-MCG TABS Take 2 tablets by mouth daily. 180 tablet 2   diclofenac  (VOLTAREN ) 75 MG EC tablet TAKE 1 TABLET BY MOUTH TWICE A DAY 60 tablet 1   diclofenac  Sodium (VOLTAREN ) 1 % GEL APPLY 2 GRAMS TOPICALLY TO THE AFFECTED AREA 4 TIMES DAILY. 700 g 11   esomeprazole  (NEXIUM ) 40 MG capsule TAKE 1 CAPSULE TWICE DAILY BEFORE MEALS 180 capsule 3  fluticasone  (FLONASE ) 50 MCG/ACT nasal spray USE 2 SPRAYS INTO THE NOSE DAILY AS DIRECTED (SUBSTITUTED FOR FLONASE ) 48 g 3   furosemide  (LASIX ) 20 MG tablet TAKE 1 TABLET BY MOUTH EVERY DAY AS NEEDED 30 tablet 5   gabapentin  (NEURONTIN ) 100 MG capsule TAKE 1 CAPSULE EVERY MORNING , 1 CAPSULE AT LUNCH, 1 CAPSULE AT DINNER, AND 2-3 CAPSULES AT BEDTIME 540 capsule 3   Glucosamine 500 MG TABS Take 500 mg by mouth daily.     glucose blood (TRUE METRIX BLOOD GLUCOSE TEST) test strip Use to check blood sugar daily 100 each 3   Iron -Vitamin C  65-125 MG TABS Take 1 tablet by mouth daily at 2 PM. 30  tablet 5   isosorbide  mononitrate (IMDUR ) 30 MG 24 hr tablet TAKE 1 TABLET EVERY DAY 90 tablet 3   latanoprost  (XALATAN ) 0.005 % ophthalmic solution Place 1 drop into both eyes at bedtime.     lidocaine  (LIDODERM ) 5 % APPLY 1 PATCH ONTO THE SKIN EVERY DAY. REMOVE AND DISCARD PATCH WITHIN 12 HOURS OR AS DIRECTED BY PHYSICIAN 90 patch 3   losartan -hydrochlorothiazide  (HYZAAR) 100-25 MG tablet Take 1 tablet by mouth daily.     mirabegron  ER (MYRBETRIQ ) 25 MG TB24 tablet Take 1 tablet (25 mg total) by mouth daily. 30 tablet 11   Omega-3 Fatty Acids (FISH OIL) 1000 MG CAPS Take 1,000 mg by mouth daily.      polycarbophil (FIBERCON) 625 MG tablet Take 625 mg by mouth daily.     potassium chloride  SA (KLOR-CON  M20) 20 MEQ tablet TAKE 1 TABLET BY MOUTH TWICE A DAY 180 tablet 2   triamcinolone  cream (KENALOG ) 0.5 % Apply 1 application topically 2 (two) times daily. 30 g 0   TRUEplus Lancets 28G MISC TEST BLOOD SUGAR EVERY DAY 100 each 3   traMADol  (ULTRAM ) 50 MG tablet Take 1 tablet (50 mg total) by mouth every 6 (six) hours as needed. for pain 90 tablet 0   Facility-Administered Medications Prior to Visit  Medication Dose Route Frequency Provider Last Rate Last Admin   cyanocobalamin  (VITAMIN B12) injection 1,000 mcg  1,000 mcg Intramuscular Once Babara Call, MD         Per HPI unless specifically indicated in ROS section below Review of Systems  Constitutional:  Negative for fatigue and fever.  HENT:  Negative for congestion.   Eyes:  Negative for pain.  Respiratory:  Negative for cough and shortness of breath.   Cardiovascular:  Negative for chest pain, palpitations and leg swelling.  Gastrointestinal:  Negative for abdominal pain.  Genitourinary:  Negative for dysuria and vaginal bleeding.  Musculoskeletal:  Negative for back pain.  Neurological:  Negative for syncope, light-headedness and headaches.  Psychiatric/Behavioral:  Negative for dysphoric mood.     Objective:  BP (!) 140/80    Pulse 71   Temp 97.7 F (36.5 C) (Temporal)   Ht 4' 11 (1.499 m)   Wt 119 lb 4 oz (54.1 kg)   SpO2 98%   BMI 24.09 kg/m   Wt Readings from Last 3 Encounters:  09/16/23 119 lb 4 oz (54.1 kg)  07/11/23 117 lb 9.6 oz (53.3 kg)  06/30/23 119 lb 4 oz (54.1 kg)      Physical Exam Vitals and nursing note reviewed.  Constitutional:      General: She is not in acute distress.    Appearance: Normal appearance. She is well-developed. She is not ill-appearing or toxic-appearing.  HENT:     Head: Normocephalic.  Right Ear: Hearing, tympanic membrane, ear canal and external ear normal.     Left Ear: Hearing, tympanic membrane, ear canal and external ear normal.     Nose: Nose normal.  Eyes:     General: Lids are normal. Lids are everted, no foreign bodies appreciated.     Conjunctiva/sclera: Conjunctivae normal.     Pupils: Pupils are equal, round, and reactive to light.  Neck:     Thyroid : No thyroid  mass or thyromegaly.     Vascular: No carotid bruit.     Trachea: Trachea normal.  Cardiovascular:     Rate and Rhythm: Normal rate and regular rhythm.     Heart sounds: Normal heart sounds, S1 normal and S2 normal. No murmur heard.    No gallop.  Pulmonary:     Effort: Pulmonary effort is normal. No respiratory distress.     Breath sounds: Normal breath sounds. No wheezing, rhonchi or rales.  Abdominal:     General: Bowel sounds are normal. There is no distension or abdominal bruit.     Palpations: Abdomen is soft. There is no fluid wave or mass.     Tenderness: There is no abdominal tenderness. There is no guarding or rebound.     Hernia: No hernia is present.  Musculoskeletal:     Cervical back: Normal range of motion and neck supple.  Lymphadenopathy:     Cervical: No cervical adenopathy.  Skin:    General: Skin is warm and dry.     Findings: No rash.  Neurological:     Mental Status: She is alert.     Cranial Nerves: No cranial nerve deficit.     Sensory: No sensory  deficit.  Psychiatric:        Mood and Affect: Mood is not anxious or depressed.        Speech: Speech normal.        Behavior: Behavior normal. Behavior is cooperative.        Judgment: Judgment normal.    Diabetic foot exam: Abnormal inspection  Previous skin breakdown at great toe.. resolved  Pre ulcerative calluses  Normal DP pulses  Decreased  sensation in bilateral feet.  Results for orders placed or performed in visit on 09/16/23  [facility] DIABETES FOOT EXAM   Collection Time: 09/16/23 12:00 AM  Result Value Ref Range   [facility] Diabetic Foot Exam done   POCT glycosylated hemoglobin (Hb A1C)   Collection Time: 09/16/23  8:37 AM  Result Value Ref Range   Hemoglobin A1C 5.6 4.0 - 5.6 %   HbA1c POC (<> result, manual entry)     HbA1c, POC (prediabetic range)     HbA1c, POC (controlled diabetic range)     *Note: Due to a large number of results and/or encounters for the requested time period, some results have not been displayed. A complete set of results can be found in Results Review.    Assessment and Plan  Controlled type 2 diabetes with neuropathy [facility]) Assessment & Plan: Chronic, diet controlled. Associated with neuropathy.  Orders: -     POCT glycosylated hemoglobin (Hb A1C) -     Microalbumin / creatinine urine ratio  Diabetic peripheral neuropathy (HCC) Assessment & Plan: Associated with diabetes.   Moderate  controlled with current gabapentin  regimen.. will try adding ALA 600 mg daily   Hypertension associated with diabetes (HCC) Assessment & Plan: Stable usually well controlled at home, chronic.  Continue current medication.  amlodipine  5 mg daily  Imdur  30  mg daily   losartan  hydrochlorothiazide  100/25 mg p.o. daily   Other orders -     traMADol  HCl; Take 1 tablet (50 mg total) by mouth every 6 (six) hours as needed. for pain  Dispense: 90 tablet; Refill: 0      Return in about 6 months (around 03/18/2024) for phone AMW,  fasting labs then CPE  with me.   Greig Ring, MD

## 2023-09-16 NOTE — Assessment & Plan Note (Addendum)
 Stable usually well controlled at home, chronic.  Continue current medication.  amlodipine  5 mg daily  Imdur  30 mg daily   losartan  hydrochlorothiazide  100/25 mg p.o. daily

## 2023-09-19 ENCOUNTER — Ambulatory Visit (INDEPENDENT_AMBULATORY_CARE_PROVIDER_SITE_OTHER): Admitting: Podiatry

## 2023-09-19 DIAGNOSIS — M79674 Pain in right toe(s): Secondary | ICD-10-CM

## 2023-09-19 DIAGNOSIS — B351 Tinea unguium: Secondary | ICD-10-CM | POA: Diagnosis not present

## 2023-09-19 DIAGNOSIS — M79675 Pain in left toe(s): Secondary | ICD-10-CM | POA: Diagnosis not present

## 2023-09-19 NOTE — Progress Notes (Signed)
   Chief Complaint  Patient presents with   routine foot care   Nail Problem    RFC    SUBJECTIVE Patient presents to office today complaining of elongated, thickened nails that cause pain while ambulating in shoes.  Patient is unable to trim their own nails. Patient is here for further evaluation and treatment.  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  Mastectomy/ SLN.   Chest pain    a. 10/2017 Cath: Nl cors.   Chronic headache    Chronic heart failure with preserved ejection fraction (HFpEF) (HCC)    a. 10/2021 Echo: EF 60-65%, no rwma, mild LVH, GrI DD, nl RV size/fxn, mild MR.   Complication of anesthesia    prior to 1991 used to have PONV.  none recently.   Diverticulosis of colon    Family history of adverse reaction to anesthesia    mother/daighter get sick   GERD with stricture    Hard of hearing    Hiatal hernia    Hypokalemia    Labile hypertension    a. 10/2017 Cath: nl renal arteries.   Mitral regurgitation    a. 10/2021 Echo: Mild MR.   Mixed hyperlipidemia    Neuropathy    feet   Osteoporosis    Pre-diabetes    Pulmonary hypertension (HCC)    a. 10/2017 Echo: PASP 35-64mmHg.   Wears dentures    full upper    Allergies  Allergen Reactions   Oxycodone  Nausea Only   Sulfa Antibiotics Rash    Childhood reaction   Sulfonamide Derivatives Rash    Childhood reaction.     OBJECTIVE General Patient is awake, alert, and oriented x 3 and in no acute distress. Derm Skin is dry and supple bilateral. Negative open lesions or macerations. Remaining integument unremarkable. Nails are tender, long, thickened and dystrophic with subungual debris, consistent with onychomycosis, 1-5 bilateral. No signs of infection noted. Vasc  DP and PT pedal pulses palpable bilaterally. Temperature gradient within normal limits.  Neuro Epicritic and protective threshold  sensation grossly intact bilaterally.  Musculoskeletal Exam No symptomatic pedal deformities noted bilateral. Muscular strength within normal limits.  ASSESSMENT 1.  Pain due to onychomycosis of toenails both  PLAN OF CARE 1. Patient evaluated today.  2. Instructed to maintain good pedal hygiene and foot care.  3. Mechanical debridement of nails 1-5 bilaterally performed using a nail nipper. Filed with dremel without incident.  4. Return to clinic in 3 mos.    Thresa EMERSON Sar, DPM [facility]  Dr. Thresa EMERSON Sar, DPM    [address] [place], [place] [address]                Office [phone]  Fax [phone]

## 2023-10-01 ENCOUNTER — Other Ambulatory Visit: Payer: Self-pay | Admitting: Family Medicine

## 2023-10-02 ENCOUNTER — Inpatient Hospital Stay: Payer: Medicare PPO

## 2023-10-02 NOTE — Telephone Encounter (Signed)
 Last filled on 12/12/22 #90 patches/ 3 refills   CPE scheduled on 03/19/24

## 2023-10-08 ENCOUNTER — Inpatient Hospital Stay: Attending: Oncology

## 2023-10-08 DIAGNOSIS — E538 Deficiency of other specified B group vitamins: Secondary | ICD-10-CM | POA: Insufficient documentation

## 2023-10-08 DIAGNOSIS — D509 Iron deficiency anemia, unspecified: Secondary | ICD-10-CM | POA: Insufficient documentation

## 2023-10-08 MED ORDER — CYANOCOBALAMIN 1000 MCG/ML IJ SOLN
1000.0000 ug | Freq: Once | INTRAMUSCULAR | Status: AC
  Administered 2023-10-08: 1000 ug via INTRAMUSCULAR
  Filled 2023-10-08: qty 1

## 2023-10-16 ENCOUNTER — Other Ambulatory Visit: Payer: Medicare PPO

## 2023-10-16 ENCOUNTER — Ambulatory Visit: Payer: Medicare PPO | Admitting: Oncology

## 2023-10-16 ENCOUNTER — Encounter: Payer: Medicare PPO | Admitting: Family Medicine

## 2023-10-16 ENCOUNTER — Ambulatory Visit: Payer: Medicare PPO

## 2023-10-21 ENCOUNTER — Ambulatory Visit (INDEPENDENT_AMBULATORY_CARE_PROVIDER_SITE_OTHER)

## 2023-10-21 VITALS — BP 140/86 | Ht 59.0 in | Wt 118.0 lb

## 2023-10-21 DIAGNOSIS — Z0000 Encounter for general adult medical examination without abnormal findings: Secondary | ICD-10-CM

## 2023-10-21 NOTE — Progress Notes (Signed)
 Because this visit was a virtual/telehealth visit,  certain criteria was not obtained, such a blood pressure, CBG if applicable, and timed get up and go. Any medications not marked as taking were not mentioned during the medication reconciliation part of the visit. Any vitals not documented were not able to be obtained due to this being a telehealth visit or patient was unable to self-report a recent blood pressure reading due to a lack of equipment at home via telehealth. Vitals that have been documented are verbally provided by the patient.  This visit was performed by a medical professional under my direct supervision. I was immediately available for consultation/collaboration. I have reviewed and agree with the Annual Wellness Visit documentation.  Subjective:   Ashley Ryan is a 78 y.o. who presents for a Medicare Wellness preventive visit.  As a reminder, Annual Wellness Visits don't include a physical exam, and some assessments may be limited, especially if this visit is performed virtually. We may recommend an in-person follow-up visit with your provider if needed.  Visit Complete: Virtual I connected with  Ashley Ryan on 10/21/23 by a audio enabled telemedicine application and verified that I am speaking with the correct person using two identifiers.  Patient Location: Home  Provider Location: Home Office  I discussed the limitations of evaluation and management by telemedicine. The patient expressed understanding and agreed to proceed.  Vital Signs: Because this visit was a virtual/telehealth visit, some criteria may be missing or patient reported. Any vitals not documented were not able to be obtained and vitals that have been documented are patient reported.  VideoDeclined- This patient declined Librarian, academic. Therefore the visit was completed with audio only.  Persons Participating in Visit: Patient.  AWV Questionnaire: No: Patient  Medicare AWV questionnaire was not completed prior to this visit.  Cardiac Risk Factors include: advanced age (>71men, >34 women);hypertension;diabetes mellitus     Objective:    Today's Vitals   10/21/23 1329  BP: (!) 140/86  Weight: 118 lb (53.5 kg)  Height: 4' 11 (1.499 m)   Body mass index is 23.83 kg/m.     10/21/2023    1:29 PM 04/29/2023   10:49 AM 10/16/2022    3:41 PM 10/16/2022    2:48 PM 10/15/2022    1:24 PM 03/29/2022    9:00 AM 02/12/2022    9:14 AM  Advanced Directives  Does Patient Have a Medical Advance Directive? No No No No No No No  Type of Science writer of [employer] Power of [place];Living will Healthcare Power of Attorney    Copy of Healthcare Power of Attorney in Chart?  No - copy requested No - copy requested No - copy requested     Would patient like information on creating a medical advance directive? No - Patient declined No - Patient declined    No - Patient declined No - Patient declined    Current Medications (verified) Outpatient Encounter Medications as of 10/21/2023  Medication Sig   Alcohol Swabs (DROPSAFE ALCOHOL PREP) 70 % PADS USE  TO TEST BLOOD SUGAR ONE TIME DAILY   amLODipine  (NORVASC ) 5 MG tablet TAKE 1 TABLET (5 MG TOTAL) BY MOUTH DAILY.   atorvastatin  (LIPITOR) 40 MG tablet TAKE 1 TABLET AT BEDTIME   Blood Glucose Calibration (ACCU-CHEK AVIVA) SOLN Use to check blood sugar once daily   Blood Glucose Calibration (TRUE METRIX LEVEL 1) Low SOLN USE AS DIRECTED  Blood Glucose Monitoring Suppl (TRUE METRIX AIR GLUCOSE METER) w/Device KIT Use to check blood sugar daily   brimonidine  (ALPHAGAN ) 0.2 % ophthalmic solution brimonidine  0.2 % eye drops  INSTILL 1 DROP INTO BOTH EYES TWICE A DAY   Calcium  Carb-Cholecalciferol  (CALCIUM  600 + D) 600-5 MG-MCG TABS Take 2 tablets by mouth daily.   diclofenac  (VOLTAREN ) 75 MG EC tablet TAKE 1 TABLET BY MOUTH TWICE A DAY   diclofenac  Sodium  (VOLTAREN ) 1 % GEL APPLY 2 GRAMS TOPICALLY TO THE AFFECTED AREA 4 TIMES DAILY.   esomeprazole  (NEXIUM ) 40 MG capsule TAKE 1 CAPSULE TWICE DAILY BEFORE MEALS   fluticasone  (FLONASE ) 50 MCG/ACT nasal spray USE 2 SPRAYS INTO THE NOSE DAILY AS DIRECTED (SUBSTITUTED FOR FLONASE )   furosemide  (LASIX ) 20 MG tablet TAKE 1 TABLET BY MOUTH EVERY DAY AS NEEDED   gabapentin  (NEURONTIN ) 100 MG capsule TAKE 1 CAPSULE EVERY MORNING , 1 CAPSULE AT LUNCH, 1 CAPSULE AT DINNER, AND 2-3 CAPSULES AT BEDTIME   Glucosamine 500 MG TABS Take 500 mg by mouth daily.   glucose blood (TRUE METRIX BLOOD GLUCOSE TEST) test strip Use to check blood sugar daily   Iron -Vitamin C  65-125 MG TABS Take 1 tablet by mouth daily at 2 PM.   isosorbide  mononitrate (IMDUR ) 30 MG 24 hr tablet TAKE 1 TABLET EVERY DAY   latanoprost  (XALATAN ) 0.005 % ophthalmic solution Place 1 drop into both eyes at bedtime.   lidocaine  (LIDODERM ) 5 % APPLY 1 PATCH ONTO THE SKIN EVERY DAY. REMOVE AND DISCARD PATCH WITHIN 12 HOURS OR AS DIRECTED BY PHYSICIAN   losartan -hydrochlorothiazide  (HYZAAR) 100-25 MG tablet Take 1 tablet by mouth daily.   mirabegron  ER (MYRBETRIQ ) 25 MG TB24 tablet Take 1 tablet (25 mg total) by mouth daily.   Omega-3 Fatty Acids (FISH OIL) 1000 MG CAPS Take 1,000 mg by mouth daily.    polycarbophil (FIBERCON) 625 MG tablet Take 625 mg by mouth daily.   potassium chloride  SA (KLOR-CON  M20) 20 MEQ tablet TAKE 1 TABLET BY MOUTH TWICE A DAY   traMADol  (ULTRAM ) 50 MG tablet Take 1 tablet (50 mg total) by mouth every 6 (six) hours as needed. for pain   triamcinolone  cream (KENALOG ) 0.5 % Apply 1 application topically 2 (two) times daily.   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  Mastectomy/ SLN.   Chest pain    a. 10/2017 Cath: Nl cors.   Chronic headache    Chronic heart failure with preserved ejection fraction (HFpEF) (HCC)    a. 10/2021 Echo: EF 60-65%, no rwma, mild LVH, GrI DD, nl RV size/fxn, mild MR.   Complication of anesthesia    prior to 1991 used to have PONV.  none recently.   Diverticulosis of colon    Family history of adverse reaction to anesthesia    mother/daighter get sick   GERD with stricture    Hard of hearing    Hiatal hernia    Hypokalemia    Labile hypertension    a. 10/2017 Cath: nl renal arteries.   Mitral regurgitation    a. 10/2021 Echo: Mild MR.   Mixed  hyperlipidemia    Neuropathy    feet   Osteoporosis    Pre-diabetes    Pulmonary hypertension (HCC)    a. 10/2017 Echo: PASP 35-6mmHg.   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   CATARACT EXTRACTION W/PHACO Left 02/21/2016   Procedure: CATARACT EXTRACTION PHACO AND INTRAOCULAR LENS PLACEMENT (IOC)  Left eye;  Surgeon: Dene Etienne, MD;  Location: [facility];  Service: Ophthalmology;  Laterality: Left;  Left eye Diabetic   CATARACT EXTRACTION W/PHACO Ryan 03/13/2016   Procedure: CATARACT EXTRACTION PHACO AND INTRAOCULAR LENS PLACEMENT (IOC)  Ryan  diabetic;  Surgeon: Dene Etienne, MD;  Location: [facility];  Service: Ophthalmology;  Laterality: Ryan;  diabetic   CHOLECYSTECTOMY  1978   COLONOSCOPY W/ POLYPECTOMY  09/09/2013   8 mm tubular adenoma of the cecum.  Lamar Aho, MD. [facility]   COLONOSCOPY WITH PROPOFOL  N/A 06/04/2019    Procedure: COLONOSCOPY WITH PROPOFOL ;  Surgeon: Jinny Carmine, MD;  Location: [facility];  Service: Endoscopy;  Laterality: N/A;   ESOPHAGOGASTRODUODENOSCOPY (EGD) WITH PROPOFOL  N/A 06/04/2019   Procedure: ESOPHAGOGASTRODUODENOSCOPY (EGD) WITH PROPOFOL ;  Surgeon: Jinny Carmine, MD;  Location: [facility] ENDOSCOPY;  Service: Endoscopy;  Laterality: N/A;   JOINT REPLACEMENT Left    knee    LEFT HEART CATH AND CORONARY ANGIOGRAPHY N/A 11/06/2017   Procedure: LEFT HEART CATH AND CORONARY ANGIOGRAPHY;  Surgeon: Mady Bruckner, MD;  Location: [facility] INVASIVE CV LAB;  Service: Cardiovascular;  Laterality: N/A;   MASTECTOMY Left 06/13/2017   mastectomy neg margins   OOPHORECTOMY     one still left   RENAL ANGIOGRAPHY  11/06/2017   Procedure: RENAL ANGIOGRAPHY;  Surgeon: Mady Bruckner, MD;  Location: [facility] IN[facility] CV LAB;  Service: Cardiovascular;;   SENTINEL NODE BIOPSY Left 06/13/2017   Procedure: SENTINEL NODE BIOPSY;  Surgeon: Dessa Reyes ORN, MD;  Location: [facility] ORS;  Service: General;  Laterality: Left;   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  Surgeon: Dessa Reyes ORN, MD;  Location: [facility] ORS;  Service: General;  Laterality: Left;   Family History  Problem Relation Age of Onset   Breast cancer Other    Stomach cancer Other    Diabetes Other    Heart disease Other    Breast cancer Sister 65   Esophageal cancer Brother    Hypertension Mother    Heart attack Mother 26   Hypertension Father    Coronary artery disease Father 34       CABG   Stroke Father    Social History   Socioeconomic History   Marital status: Married    Spouse name: Not on file   Number of children: 2   Years of education: Not on file   Highest education level: Not on file  Occupational History   Occupation: retired cna    Employer: RETIRED  Tobacco Use   Smoking status: Never   Smokeless tobacco: Never  Vaping Use   Vaping  status: Never Used  Substance and Sexual Activity   Alcohol use: No   Drug use: No   Sexual activity: Not Currently  Other Topics Concern   Not on file  Social History Narrative   Drinks sweet tea, diet sodas 4 a day   No living will, full code (reviewed 2014)   Social Drivers of Corporate investment banker Strain: Low Risk  (10/21/2023)   Overall Financial Resource Strain (CARDIA)    Difficulty of Paying Living Expenses: Not hard at all  Recent Concern: Financial Resource Strain - Medium Risk (09/26/2023)   Received [facility]   Overall Financial Resource Strain (CARDIA)    Difficulty of Paying Living Expenses: Somewhat hard  Food Insecurity: No Food Insecurity (10/21/2023)   Hunger Vital Sign    Worried About Running Out of Food in the Last Year: Never true    Ran Out of Food in the Last Year: Never true  Recent Concern: Food Insecurity - Food Insecurity Present (09/26/2023)   Received from [facility]   Hunger Vital Sign    Within the past 12 months, you worried that your food would run out before you got the money to buy more.: Sometimes true    Within the past 12 months, the food you bought just didn't last and you didn't have money to get more.: Sometimes true  Transportation Needs: No Transportation Needs (10/21/2023)   PRAPARE - Administrator, Civil Service (Medical): No    Lack of Transportation (Non-Medical): No  Physical Activity: Patient Declined (10/21/2023)   Exercise Vital Sign    Days of Exercise per Week: Patient declined    Minutes of Exercise per Session: Patient declined  Recent Concern: Physical Activity - Inactive (09/26/2023)   Received from [facility]   Exercise Vital Sign    On average, how many days per week do you engage in moderate to strenuous exercise (like a brisk walk)?: 0 days    On average, how many minutes do you engage in exercise at this level?: 0 min  Stress: No Stress  Concern Present (10/21/2023)   [employer] of Occupational Health - Occupational Stress Questionnaire    Feeling of Stress: Not at all  Social Connections: Moderately Isolated (10/21/2023)   Social Connection and Isolation Panel    Frequency of Communication with Friends and Family: More than three times a week    Frequency of Social Gatherings with Friends and Family: Once a week    Attends Religious Services: Never    Database administrator or Organizations: No    Attends Engineer, structural: Never    Marital Status: Married    Tobacco Counseling Counseling given: Not Answered    Clinical Intake:  Pre-visit preparation completed: Yes  Pain : No/denies pain     BMI - recorded: 23.83 Nutritional Status: BMI of 19-24  Normal Nutritional Risks: None Diabetes: No  Lab Results  Component Value Date   HGBA1C 5.6 09/16/2023   HGBA1C 6.0 02/18/2023   HGBA1C 6.2 02/12/2022     How often do you need to have someone help you when you read instructions, pamphlets, or other written materials from your doctor or pharmacy?: 1 - Never  Interpreter Needed?: No  Information entered by :: [email or web address]   Activities of Daily Living     10/21/2023    1:33 PM  In your present state of health, do you have any difficulty performing the following activities:  Hearing? 0  Vision? 0  Difficulty concentrating or making decisions? 0  Walking or climbing stairs? 0  Dressing  or bathing? 0  Doing errands, shopping? 0  Preparing Food and eating ? N  Using the Toilet? N  In the past six months, have you accidently leaked urine? N  Do you have problems with loss of bowel control? N  Managing your Medications? N  Managing your Finances? N  Housekeeping or managing your Housekeeping? N    Patient Care Team: Avelina Greig BRAVO, MD as PCP - General (Family Medicine) End, Lonni, MD as PCP - Cardiology (Cardiology) Mittie Gaskin, MD as Referring Physician  (Ophthalmology) Dessa Reyes ORN, MD (General Surgery) Babara Call, MD as Consulting Physician (Oncology)  I have updated your Care Teams any recent Medical Services you may have received from other providers in the past year.     Assessment:   This is a routine wellness examination for Ashley Ryan.  Hearing/Vision screen Hearing Screening - Comments:: Patient wears hearing aids  Vision Screening - Comments:: Patient wears glasses and    Goals Addressed             This Visit's Progress    Patient Stated       Try to stay healthy        Depression Screen     10/21/2023    1:34 PM 06/30/2023    3:40 PM 05/08/2023    8:44 AM 03/19/2023    9:05 AM 10/15/2022    1:21 PM 10/10/2022   11:51 AM 06/06/2022    9:34 AM  PHQ 2/9 Scores  PHQ - 2 Score 0 0 0 0 1 1 0  PHQ- 9 Score 0 0  0  3 1    Fall Risk     10/21/2023    1:31 PM 09/16/2023    8:30 AM 05/08/2023    8:44 AM 10/15/2022    1:13 PM 10/10/2022   11:51 AM  Fall Risk   Falls in the past year? 1 1 0 1 1  Number falls in past yr: 1 0 0 1 1  Comment    4 falls:feet gets tangled up reason for falls   Injury with Fall? 0 0 0 0 0  Risk for fall due to : No Fall Risks History of fall(s) No Fall Risks History of fall(s);Orthopedic patient History of fall(s)  Follow up Falls evaluation completed Falls evaluation completed Falls evaluation completed Education provided;Falls prevention discussed Falls evaluation completed    MEDICARE RISK AT HOME:  Medicare Risk at Home Any stairs in or around the home?: Yes If so, are there any without handrails?: No Home free of loose throw rugs in walkways, pet beds, electrical cords, etc?: Yes Adequate lighting in your home to reduce risk of falls?: Yes Life alert?: No Use of a cane, walker or w/c?: No Grab bars in the bathroom?: Yes Shower chair or bench in shower?: Yes Elevated toilet seat or a handicapped toilet?: Yes  TIMED UP AND GO:  Was the test performed?  No  Cognitive  Function: 6CIT completed    12/15/2018   12:22 PM 11/12/2016    8:45 AM 11/09/2015   10:20 AM  MMSE - Mini Mental State Exam  Orientation to time 5 5  5    Orientation to Place 5 5  5    Registration 3 3  3    Attention/ Calculation 5 0  0   Recall 3 3  2    Recall-comments   pt was unable to recall 1 of 3 words   Language- name 2 objects  0  0  Language- repeat 1 1 1   Language- follow 3 step command  3  3   Language- read & follow direction  0  0   Write a sentence  0  0   Copy design  0  0   Total score  20  19      Data saved with a previous flowsheet row definition        10/21/2023    1:34 PM 10/15/2022    1:26 PM 02/12/2022    9:20 AM  6CIT Screen  What Year? 0 points 0 points 0 points  What month? 0 points 0 points 0 points  What time? 0 points 0 points 3 points  Count back from 20 0 points 0 points 0 points  Months in reverse 0 points 0 points 0 points  Repeat phrase 0 points 0 points 0 points  Total Score 0 points 0 points 3 points    Immunizations Immunization History  Administered Date(s) Administered   Fluad Quad(high Dose 65+) 11/17/2018, 10/27/2019, 01/30/2021, 10/18/2021   Fluad Trivalent(High Dose 65+) 10/10/2022   Fluzone Influenza virus vaccine,trivalent (IIV3), split virus 11/20/2013   Influenza Split 10/08/2010, 10/15/2011   Influenza Whole 11/03/2008, 11/16/2009   Influenza,inj,Quad PF,6+ Mos 10/29/2013, 11/01/2014, 11/09/2015, 12/05/2016, 10/30/2017   PFIZER(Purple Top)SARS-COV-2 Vaccination 02/25/2019, 03/18/2019   PPD Test 05/03/2011, 05/04/2012, 11/27/2013, 06/06/2015   Pneumococcal Conjugate-13 10/29/2013   Pneumococcal Polysaccharide-23 12/22/2007, 11/09/2015   Td 01/22/2007   Tdap 10/15/2011    Screening Tests Health Maintenance  Topic Date Due   Zoster Vaccines- Shingrix (1 of 2) Never done   COVID-19 Vaccine (3 - Pfizer risk series) 04/15/2019   DTaP/Tdap/Td (3 - Td or Tdap) 10/14/2021   OPHTHALMOLOGY EXAM  11/14/2022   Influenza  Vaccine  04/20/2024 (Originally 08/22/2023)   HEMOGLOBIN A1C  03/18/2024   Diabetic kidney evaluation - eGFR measurement  04/14/2024   Colonoscopy  06/03/2024   Mammogram  09/02/2024   Diabetic kidney evaluation - Urine ACR  09/15/2024   FOOT EXAM  09/15/2024   Medicare Annual Wellness (AWV)  10/20/2024   Pneumococcal Vaccine: 50+ Years  Completed   DEXA SCAN  Completed   HPV VACCINES  Aged Out   Meningococcal B Vaccine  Aged Out   Hepatitis C Screening  Discontinued    Health Maintenance Items Addressed:patient declined   Additional Screening:  Vision Screening: Recommended annual ophthalmology exams for early detection of glaucoma and other disorders of the eye. Is the patient up to date with their annual eye exam?  Yes  Who is the provider or what is the name of the office in which the patient attends annual eye exams? Patient goes to [facility]   Dental Screening: Recommended annual dental exams for proper oral hygiene  Community Resource Referral / Chronic Care Management: CRR required this visit?  No   CCM required this visit?  No   Plan:    I have personally reviewed and noted the following in the patient's chart:   Medical and social history Use of alcohol, tobacco or illicit drugs  Current medications and supplements including opioid prescriptions. Patient is not currently taking opioid prescriptions. Functional ability and status Nutritional status Physical activity Advanced directives List of other physicians Hospitalizations, surgeries, and ER visits in previous 12 months Vitals Screenings to include cognitive, depression, and falls Referrals and appointments  In addition, I have reviewed and discussed with patient certain preventive protocols, quality metrics, and best practice recommendations. A written personalized care plan for  preventive services as well as general preventive health recommendations were provided to patient.   Ashley Ryan,  [place]   10/21/2023   After Visit Summary: (MyChart) Due to this being a telephonic visit, the after visit summary with patients personalized plan was offered to patient via MyChart   Notes: Nothing significant to report at this time.

## 2023-10-21 NOTE — Patient Instructions (Signed)
 Ashley Ryan,  Thank you for taking the time for your Medicare Wellness Visit. I appreciate your continued commitment to your health goals. Please review the care plan we discussed, and feel free to reach out if I can assist you further.  Medicare recommends these wellness visits once per year to help you and your care team stay ahead of potential health issues. These visits are designed to focus on prevention, allowing your provider to concentrate on managing your acute and chronic conditions during your regular appointments.  Please note that Annual Wellness Visits do not include a physical exam. Some assessments may be limited, especially if the visit was conducted virtually. If needed, we may recommend a separate in-person follow-up with your provider.  Ongoing Care Seeing your primary care provider every 3 to 6 months helps us  monitor your health and provide consistent, personalized care.   Referrals If a referral was made during today's visit and you haven't received any updates within two weeks, please contact the referred provider directly to check on the status.  Recommended Screenings:  Health Maintenance  Topic Date Due   Zoster (Shingles) Vaccine (1 of 2) Never done   COVID-19 Vaccine (3 - Pfizer risk series) 04/15/2019   DTaP/Tdap/Td vaccine (3 - Td or Tdap) 10/14/2021   Eye exam for diabetics  11/14/2022   Flu Shot  04/20/2024*   Hemoglobin A1C  03/18/2024   Yearly kidney function blood test for diabetes  04/14/2024   Colon Cancer Screening  06/03/2024   Breast Cancer Screening  09/02/2024   Yearly kidney health urinalysis for diabetes  09/15/2024   Complete foot exam   09/15/2024   Medicare Annual Wellness Visit  10/20/2024   Pneumococcal Vaccine for age over 10  Completed   DEXA scan (bone density measurement)  Completed   HPV Vaccine  Aged Out   Meningitis B Vaccine  Aged Out   Hepatitis C Screening  Discontinued  *Topic was postponed. The date shown is not the  original due date.       10/21/2023    1:29 PM  Advanced Directives  Does Patient Have a Medical Advance Directive? No  Would patient like information on creating a medical advance directive? No - Patient declined   Advance Care Planning is important because it: Ensures you receive medical care that aligns with your values, goals, and preferences. Provides guidance to your family and loved ones, reducing the emotional burden of decision-making during critical moments.  Vision: Annual vision screenings are recommended for early detection of glaucoma, cataracts, and diabetic retinopathy. These exams can also reveal signs of chronic conditions such as diabetes and high blood pressure.  Dental: Annual dental screenings help detect early signs of oral cancer, gum disease, and other conditions linked to overall health, including heart disease and diabetes.  Please see the attached documents for additional preventive care recommendations.

## 2023-10-22 ENCOUNTER — Ambulatory Visit: Payer: Self-pay

## 2023-10-22 NOTE — Telephone Encounter (Signed)
 FYI Only or Action Required?: FYI only for provider.  Patient was last seen in primary care on 09/16/2023 by Avelina Greig BRAVO, MD.  Called Nurse Triage reporting Knee Pain.  Symptoms began several months ago.  Interventions attempted: Prescription medications: tramadol ; apap.  Symptoms are: gradually worsening.  Triage Disposition: See PCP When Office is Open (Within 3 Days)  Patient/caregiver understands and will follow disposition?: Yes  Copied from CRM #[id number]. Topic: Clinical - Red Word Triage >> Oct 22, 2023  2:21 PM [place] A wrote: [facility] that prompted transfer to Nurse Triage: Patient has pain in right knee for last two months. Has appointment but wants to cancel. Patient is still having pain. Reason for Disposition  [1] Swollen joint AND [2] no fever or redness  Answer Assessment - Initial Assessment Questions Pt has hx of arthritic joints, states she has had R knee pain for the last two months, app scheduled for 10/7 but unable to make that appointment. Takes tramadol  and apap. Denies radiation, states unsure if joint is swollen. Denies discoloration   1. LOCATION and RADIATION: Where is the pain located?      R knee; radiation 2. QUALITY: What does the pain feel like?  (e.g., sharp, dull, aching, burning)     achey 3. SEVERITY: How bad is the pain? What does it keep you from doing?   (Scale 1-10; or mild, moderate, severe)     6/10 4. ONSET: When did the pain start? Does it come and go, or is it there all the time?     Last two months 5. RECURRENT: Have you had this pain before? If Yes, ask: When, and what happened then?     Yes; arthritis chronic 6. SETTING: Has there been any recent work, exercise or other activity that involved that part of the body?      denies 7. AGGRAVATING FACTORS: What makes the knee pain worse? (e.g., walking, climbing stairs, running)     Sitting position 8. ASSOCIATED SYMPTOMS: Is there any swelling or redness of the  knee?     Mild swelling 9. OTHER SYMPTOMS: Do you have any other symptoms? (e.g., calf pain, chest pain, difficulty breathing, fever)     denies  Protocols used: Knee Pain-A-AH

## 2023-10-23 ENCOUNTER — Ambulatory Visit: Admitting: Family Medicine

## 2023-10-26 NOTE — Progress Notes (Unsigned)
 [name] [name], MD, CAQ Sports Medicine [facility] at [facility] [address] [place], [address]  Phone: [phone]  FAX: [phone]  Ashley Ryan - 78 y.o. female  MRN [id number]  Date of Birth: 11/24/1945  Date: 10/27/2023  PCP: Avelina Greig BRAVO, MD  Referral: Avelina Greig BRAVO, MD  No chief complaint on file.  Subjective:   Ashley Ryan is a 78 y.o. very pleasant female patient with There is no height or weight on file to calculate BMI. who presents with the following:  Discussed the use of AI scribe software for clinical note transcription with the patient, who gave verbal consent to proceed.  Chronic right knee pain and osteoarthritis.  The last time I saw her in June 2025 and I saw her for some left-sided shoulder pain and arthritis and we did do an intra-articular shoulder injection.  I last saw her for knee arthritis in April 2025, and at that point we did do a intra-articular knee injection. History of Present Illness     Review of Systems is noted in the HPI, as appropriate  Objective:   There were no vitals taken for this visit.  GEN: No acute distress; alert,appropriate. PULM: Breathing comfortably in no respiratory distress PSYCH: Normally interactive.   Laboratory and Imaging Data:  Assessment and Plan:   No diagnosis found. Assessment & Plan   Medication Management during today's office visit: No orders of the defined types were placed in this encounter.  There are no discontinued medications.  Orders placed today for conditions managed today: No orders of the defined types were placed in this encounter.   Disposition: No follow-ups on file.  [employer] One speech-to-text software was used for transcription in this dictation.  Possible transcriptional errors can occur using Animal nutritionist.   Signed,  Jacques DASEN. Nicky Kras, MD   Outpatient Encounter Medications as of 10/27/2023  Medication Sig    Alcohol Swabs (DROPSAFE ALCOHOL PREP) 70 % PADS USE  TO TEST BLOOD SUGAR ONE TIME DAILY   amLODipine  (NORVASC ) 5 MG tablet TAKE 1 TABLET (5 MG TOTAL) BY MOUTH DAILY.   atorvastatin  (LIPITOR) 40 MG tablet TAKE 1 TABLET AT BEDTIME   Blood Glucose Calibration (ACCU-CHEK AVIVA) SOLN Use to check blood sugar once daily   Blood Glucose Calibration (TRUE METRIX LEVEL 1) Low SOLN USE AS DIRECTED   Blood Glucose Monitoring Suppl (TRUE METRIX AIR GLUCOSE METER) w/Device KIT Use to check blood sugar daily   brimonidine  (ALPHAGAN ) 0.2 % ophthalmic solution brimonidine  0.2 % eye drops  INSTILL 1 DROP INTO BOTH EYES TWICE A DAY   Calcium  Carb-Cholecalciferol  (CALCIUM  600 + D) 600-5 MG-MCG TABS Take 2 tablets by mouth daily.   diclofenac  (VOLTAREN ) 75 MG EC tablet TAKE 1 TABLET BY MOUTH TWICE A DAY   diclofenac  Sodium (VOLTAREN ) 1 % GEL APPLY 2 GRAMS TOPICALLY TO THE AFFECTED AREA 4 TIMES DAILY.   esomeprazole  (NEXIUM ) 40 MG capsule TAKE 1 CAPSULE TWICE DAILY BEFORE MEALS   fluticasone  (FLONASE ) 50 MCG/ACT nasal spray USE 2 SPRAYS INTO THE NOSE DAILY AS DIRECTED (SUBSTITUTED FOR FLONASE )   furosemide  (LASIX ) 20 MG tablet TAKE 1 TABLET BY MOUTH EVERY DAY AS NEEDED   gabapentin  (NEURONTIN ) 100 MG capsule TAKE 1 CAPSULE EVERY MORNING , 1 CAPSULE AT LUNCH, 1 CAPSULE AT DINNER, AND 2-3 CAPSULES AT BEDTIME   Glucosamine 500 MG TABS Take 500 mg by mouth daily.   glucose blood (TRUE METRIX BLOOD GLUCOSE  TEST) test strip Use to check blood sugar daily   Iron -Vitamin C  65-125 MG TABS Take 1 tablet by mouth daily at 2 PM.   isosorbide  mononitrate (IMDUR ) 30 MG 24 hr tablet TAKE 1 TABLET EVERY DAY   latanoprost  (XALATAN ) 0.005 % ophthalmic solution Place 1 drop into both eyes at bedtime.   lidocaine  (LIDODERM ) 5 % APPLY 1 PATCH ONTO THE SKIN EVERY DAY. REMOVE AND DISCARD PATCH WITHIN 12 HOURS OR AS DIRECTED BY PHYSICIAN   losartan -hydrochlorothiazide  (HYZAAR) 100-25 MG tablet Take 1 tablet by mouth daily.   mirabegron  ER  (MYRBETRIQ ) 25 MG TB24 tablet Take 1 tablet (25 mg total) by mouth daily.   Omega-3 Fatty Acids (FISH OIL) 1000 MG CAPS Take 1,000 mg by mouth daily.    polycarbophil (FIBERCON) 625 MG tablet Take 625 mg by mouth daily.   potassium chloride  SA (KLOR-CON  M20) 20 MEQ tablet TAKE 1 TABLET BY MOUTH TWICE A DAY   traMADol  (ULTRAM ) 50 MG tablet Take 1 tablet (50 mg total) by mouth every 6 (six) hours as needed. for pain   triamcinolone  cream (KENALOG ) 0.5 % Apply 1 application topically 2 (two) times daily.   TRUEplus Lancets 28G MISC TEST BLOOD SUGAR EVERY DAY   Facility-Administered Encounter Medications as of 10/27/2023  Medication   cyanocobalamin  (VITAMIN B12) injection 1,000 mcg

## 2023-10-27 ENCOUNTER — Ambulatory Visit (INDEPENDENT_AMBULATORY_CARE_PROVIDER_SITE_OTHER): Admitting: Family Medicine

## 2023-10-27 ENCOUNTER — Inpatient Hospital Stay

## 2023-10-27 ENCOUNTER — Encounter: Payer: Self-pay | Admitting: Family Medicine

## 2023-10-27 VITALS — BP 118/70 | HR 75 | Temp 97.3°F | Ht 59.0 in | Wt 119.4 lb

## 2023-10-27 DIAGNOSIS — M1711 Unilateral primary osteoarthritis, right knee: Secondary | ICD-10-CM

## 2023-10-27 DIAGNOSIS — E119 Type 2 diabetes mellitus without complications: Secondary | ICD-10-CM | POA: Diagnosis not present

## 2023-10-27 DIAGNOSIS — H353132 Nonexudative age-related macular degeneration, bilateral, intermediate dry stage: Secondary | ICD-10-CM | POA: Diagnosis not present

## 2023-10-27 DIAGNOSIS — H401133 Primary open-angle glaucoma, bilateral, severe stage: Secondary | ICD-10-CM | POA: Diagnosis not present

## 2023-10-27 DIAGNOSIS — H18513 Endothelial corneal dystrophy, bilateral: Secondary | ICD-10-CM | POA: Diagnosis not present

## 2023-10-27 LAB — OPHTHALMOLOGY REPORT-SCANNED

## 2023-10-27 MED ORDER — TRIAMCINOLONE ACETONIDE 40 MG/ML IJ SUSP
40.0000 mg | Freq: Once | INTRAMUSCULAR | Status: AC
  Administered 2023-10-27: 40 mg via INTRA_ARTICULAR

## 2023-10-27 NOTE — Addendum Note (Signed)
 Addended by: WENDELL ARLAND RAMAN on: 10/27/2023 09:07 AM   Modules accepted: Orders

## 2023-10-28 ENCOUNTER — Ambulatory Visit: Admitting: Family Medicine

## 2023-10-28 ENCOUNTER — Telehealth: Payer: Self-pay | Admitting: Oncology

## 2023-10-28 NOTE — Telephone Encounter (Signed)
 Pt missed lab prior to MD/infusion.  I called and spoke with pt to see if she could do lab today prior to MD/infusion tomorrow. Pt stated she is with her husband and would have to take him back home and would like to r/s all appts. Appts are r/s to next available in Nov. New date/time confirmed with pt.

## 2023-10-29 ENCOUNTER — Ambulatory Visit

## 2023-10-29 ENCOUNTER — Inpatient Hospital Stay: Admitting: Oncology

## 2023-10-29 ENCOUNTER — Inpatient Hospital Stay

## 2023-10-30 ENCOUNTER — Telehealth: Payer: Self-pay | Admitting: *Deleted

## 2023-10-30 ENCOUNTER — Encounter: Payer: Self-pay | Admitting: Family Medicine

## 2023-10-30 ENCOUNTER — Telehealth (INDEPENDENT_AMBULATORY_CARE_PROVIDER_SITE_OTHER): Admitting: Family Medicine

## 2023-10-30 VITALS — BP 147/72 | HR 71 | Temp 98.0°F | Ht 59.0 in | Wt 120.4 lb

## 2023-10-30 DIAGNOSIS — F418 Other specified anxiety disorders: Secondary | ICD-10-CM

## 2023-10-30 MED ORDER — BUSPIRONE HCL 5 MG PO TABS: 5.0000 mg | ORAL_TABLET | Freq: Two times a day (BID) | ORAL | 0 refills | Status: DC

## 2023-10-30 NOTE — Telephone Encounter (Signed)
 Spoke with [name].  She states her husband is in poor health.  Just found out he has fluid around his lungs and they are not going to pull anymore fluid off.  She just found out none of her doctors are in network so she is having to find new insurance and then she found [name] doing weird things to his checking account.  She feels she need a little something for her nerves to help her get through some of this.  Virtual appointment scheduled today at 3:00 pm this afternoon with Dr. Avelina.

## 2023-10-30 NOTE — Progress Notes (Signed)
 VIRTUAL VISIT A virtual visit is felt to be most appropriate for this patient at this time.   I connected with the patient on 10/30/23 at  3:00 PM EDT by virtual telehealth platform and verified that I am speaking with the correct person using two identifiers.   I discussed the limitations, risks, security and privacy concerns of performing an evaluation and management service by  virtual telehealth platform and the availability of in person appointments. I also discussed with the patient that there may be a patient responsible charge related to this service. The patient expressed understanding and agreed to proceed.  Patient location: Home Provider Location: [place] Participants: Greig Ring and Gaetana JINNY Larve   Chief Complaint  Patient presents with   Anxiety    (Nerves) See telephone encounter from today    History of Present Illness:  78 y.o. female patient of Lynette Noah E, MD presents with  anxiety  Patient reports increased stress in the last few weeks.  Her husband's health is deteriorating, she has had issues with her bank and has found out that her doctors are not in network.   She realizes her husband is  essentially on palliative care.  She is more tearful than usual, crying at movies. Tearful at appt.   She feels more anxious than anything.     She has history of MDD, in remission. Not currently on any medication. Was on prozac  in past for migraine given interaction with cancer med... no longer on.  Has celexa  gave her headache in st.     10/30/2023    3:11 PM  GAD 7 : Generalized Anxiety Score  Nervous, Anxious, on Edge 1  Control/stop worrying 0  Worry too much - different things 0  Trouble relaxing 0  Restless 0  Easily annoyed or irritable 0  Afraid - awful might happen 0  Total GAD 7 Score 1  Anxiety Difficulty Not difficult at all       10/30/2023    3:09 PM 10/21/2023    1:34 PM 06/30/2023    3:40 PM  Depression screen PHQ 2/9   Decreased Interest 0 0 0  Down, Depressed, Hopeless 0 0 0  PHQ - 2 Score 0 0 0  Altered sleeping 0 0 0  Tired, decreased energy 0 0 0  Change in appetite 1 0 0  Feeling bad or failure about yourself  0 0 0  Trouble concentrating 0 0 0  Moving slowly or fidgety/restless 0 0 0  Suicidal thoughts 0 0 0  PHQ-9 Score 1 0 0  Difficult doing work/chores Somewhat difficult Not difficult at all Not difficult at all        COVID 19 screen No recent travel or known exposure to COVID19 The patient denies respiratory symptoms of COVID 19 at this time.  The importance of social distancing was discussed today.   Review of Systems  Constitutional:  Negative for chills and fever.  HENT:  Negative for congestion and ear pain.   Eyes:  Negative for pain and redness.  Respiratory:  Negative for cough and shortness of breath.   Cardiovascular:  Negative for chest pain, palpitations and leg swelling.  Gastrointestinal:  Negative for abdominal pain, blood in stool, constipation, diarrhea, nausea and vomiting.  Genitourinary:  Negative for dysuria.  Musculoskeletal:  Negative for falls and myalgias.  Skin:  Negative for rash.  Neurological:  Negative for dizziness.  Psychiatric/Behavioral:  Negative for depression, hallucinations and suicidal ideas. The  patient is nervous/anxious. The patient does not have insomnia.       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  Mastectomy/ SLN.   Chest pain    a. 10/2017 Cath: Nl cors.   Chronic headache    Chronic heart failure with preserved ejection fraction (HFpEF) (HCC)    a. 10/2021 Echo: EF 60-65%, no rwma, mild LVH, GrI DD, nl RV size/fxn, mild MR.   Complication of anesthesia    prior to 1991 used to have PONV.  none recently.   Diverticulosis of colon    Family history of adverse reaction to anesthesia    mother/daighter get sick   GERD with  stricture    Hard of hearing    Hiatal hernia    Hypokalemia    Labile hypertension    a. 10/2017 Cath: nl renal arteries.   Mitral regurgitation    a. 10/2021 Echo: Mild MR.   Mixed hyperlipidemia    Neuropathy    feet   Osteoporosis    Pre-diabetes    Pulmonary hypertension (HCC)    a. 10/2017 Echo: PASP 35-23mmHg.   Wears dentures    full upper    reports that she has never smoked. She has never used smokeless tobacco. She reports that she does not drink alcohol and does not use drugs.   Current Outpatient Medications:    Alcohol Swabs (DROPSAFE ALCOHOL PREP) 70 % PADS, USE  TO TEST BLOOD SUGAR ONE TIME DAILY, Disp: 100 each, Rfl: 3   amLODipine  (NORVASC ) 5 MG tablet, TAKE 1 TABLET (5 MG TOTAL) BY MOUTH DAILY., Disp: 90 tablet, Rfl: 3   atorvastatin  (LIPITOR) 40 MG tablet, TAKE 1 TABLET AT BEDTIME, Disp: 90 tablet, Rfl: 3   Blood Glucose Calibration (ACCU-CHEK AVIVA) SOLN, Use to check blood sugar once daily, Disp: 1 each, Rfl: 0   Blood Glucose Calibration (TRUE METRIX LEVEL 1) Low SOLN, USE AS DIRECTED, Disp: 1 each, Rfl: 3   Blood Glucose Monitoring Suppl (TRUE METRIX AIR GLUCOSE METER) w/Device KIT, Use to check blood sugar daily, Disp: 1 kit, Rfl: 0   brimonidine  (ALPHAGAN ) 0.2 % ophthalmic solution, brimonidine  0.2 % eye drops  INSTILL 1 DROP INTO BOTH EYES TWICE A DAY, Disp: , Rfl:    busPIRone (BUSPAR) 5 MG tablet, Take 1 tablet (5 mg total) by mouth 2 (two) times daily., Disp: 60 tablet, Rfl: 0   Calcium  Carb-Cholecalciferol  (CALCIUM  600 + D) 600-5 MG-MCG TABS, Take 2 tablets by mouth daily., Disp: 180 tablet, Rfl: 2   diclofenac  (VOLTAREN ) 75 MG EC tablet, TAKE 1 TABLET BY MOUTH TWICE A DAY, Disp: 60 tablet, Rfl: 1   diclofenac  Sodium (VOLTAREN ) 1 % GEL, APPLY 2 GRAMS TOPICALLY TO THE AFFECTED AREA 4 TIMES DAILY., Disp: 700 g, Rfl: 11   esomeprazole  (NEXIUM ) 40 MG capsule, TAKE 1 CAPSULE TWICE DAILY BEFORE MEALS, Disp: 180 capsule, Rfl: 3   fluticasone  (FLONASE ) 50  MCG/ACT nasal spray, USE 2 SPRAYS INTO THE NOSE DAILY AS DIRECTED (SUBSTITUTED FOR FLONASE ), Disp: 48 g, Rfl: 3   furosemide  (LASIX ) 20 MG tablet, TAKE 1 TABLET BY MOUTH EVERY DAY AS NEEDED, Disp: 30 tablet, Rfl: 5   gabapentin  (NEURONTIN ) 100 MG capsule, TAKE 1 CAPSULE EVERY MORNING , 1 CAPSULE AT LUNCH, 1 CAPSULE AT DINNER, AND 2-3 CAPSULES AT BEDTIME, Disp: 540 capsule, Rfl: 3   Glucosamine  500 MG TABS, Take 500 mg by mouth daily., Disp: , Rfl:    glucose blood (TRUE METRIX BLOOD GLUCOSE TEST) test strip, Use to check blood sugar daily, Disp: 100 each, Rfl: 3   Iron -Vitamin C  65-125 MG TABS, Take 1 tablet by mouth daily at 2 PM., Disp: 30 tablet, Rfl: 5   isosorbide  mononitrate (IMDUR ) 30 MG 24 hr tablet, TAKE 1 TABLET EVERY DAY, Disp: 90 tablet, Rfl: 3   latanoprost  (XALATAN ) 0.005 % ophthalmic solution, Place 1 drop into both eyes at bedtime., Disp: , Rfl:    lidocaine  (LIDODERM ) 5 %, APPLY 1 PATCH ONTO THE SKIN EVERY DAY. REMOVE AND DISCARD PATCH WITHIN 12 HOURS OR AS DIRECTED BY PHYSICIAN, Disp: 90 patch, Rfl: 1   losartan -hydrochlorothiazide  (HYZAAR) 100-25 MG tablet, Take 1 tablet by mouth daily., Disp: , Rfl:    mirabegron  ER (MYRBETRIQ ) 25 MG TB24 tablet, Take 1 tablet (25 mg total) by mouth daily., Disp: 30 tablet, Rfl: 11   Omega-3 Fatty Acids (FISH OIL) 1000 MG CAPS, Take 1,000 mg by mouth daily. , Disp: , Rfl:    polycarbophil (FIBERCON) 625 MG tablet, Take 625 mg by mouth daily., Disp: , Rfl:    potassium chloride  SA (KLOR-CON  M20) 20 MEQ tablet, TAKE 1 TABLET BY MOUTH TWICE A DAY, Disp: 180 tablet, Rfl: 2   traMADol  (ULTRAM ) 50 MG tablet, Take 1 tablet (50 mg total) by mouth every 6 (six) hours as needed. for pain, Disp: 90 tablet, Rfl: 0   triamcinolone  cream (KENALOG ) 0.5 %, Apply 1 application topically 2 (two) times daily., Disp: 30 g, Rfl: 0   TRUEplus Lancets 28G MISC, TEST BLOOD SUGAR EVERY DAY, Disp: 100 each, Rfl: 3 No current facility-administered medications for this  visit.  Facility-Administered Medications Ordered in Other Visits:    cyanocobalamin  (VITAMIN B12) injection 1,000 mcg, 1,000 mcg, Intramuscular, Once, Babara Call, MD   Observations/Objective: Blood pressure (!) 147/72, pulse 71, temperature 98 F (36.7 C), temperature source Temporal, height 4' 11 (1.499 m), weight 120 lb 6 oz (54.6 kg).  Physical Exam Constitutional:      General: The patient is not in acute distress. Pulmonary:     Effort: Pulmonary effort is normal. No respiratory distress.  Neurological:     Mental Status: The patient is alert and oriented to person, place, and time.  Psychiatric:        Mood and Affect: Mood normal.        Behavior: Behavior normal.    Assessment and Plan Situational anxiety Assessment & Plan: Acute, in association with her husband's deterioration of health.  We discussed different options such as counseling, restarting SSRI she is no longer on cancer medication as well as acute treatment of anxiety. She states she does not want to start on a regular medication.  I do not believe benzodiazepines or hydroxyzine are a great option in her given risk of oversedation. Will try trial of BuSpar 2 times daily but she can use this as needed.  She will let me know if she is not noticing an improvement or if she requests a referral to psychology.  Return and ER precautions provided.   Other orders -     busPIRone HCl; Take 1 tablet (5 mg total) by mouth 2 (two) times daily.  Dispense: 60 tablet; Refill: 0      I discussed the assessment and treatment plan with the patient. The patient was provided an opportunity to ask questions and all were answered. The patient  agreed with the plan and demonstrated an understanding of the instructions.   The patient was advised to call back or seek an in-person evaluation if the symptoms worsen or if the condition fails to improve as anticipated.     Greig Ring, MD

## 2023-10-30 NOTE — Assessment & Plan Note (Signed)
 Acute, in association with her husband's deterioration of health.  We discussed different options such as counseling, restarting SSRI she is no longer on cancer medication as well as acute treatment of anxiety. She states she does not want to start on a regular medication.  I do not believe benzodiazepines or hydroxyzine are a great option in her given risk of oversedation. Will try trial of BuSpar 2 times daily but she can use this as needed.  She will let me know if she is not noticing an improvement or if she requests a referral to psychology.  Return and ER precautions provided.

## 2023-10-30 NOTE — Telephone Encounter (Signed)
 Copied from CRM #[id number]. Topic: General - Other >> Oct 30, 2023  8:44 AM Pinkey ORN wrote: Reason for CRM: Requesting Call Back >> Oct 30, 2023  8:45 AM Pinkey ORN wrote: Patient is requesting a call back from [place], states she needs to speak with her. States it's in reference to getting something for her nerves.

## 2023-11-05 ENCOUNTER — Encounter: Payer: Self-pay | Admitting: Gastroenterology

## 2023-11-05 ENCOUNTER — Ambulatory Visit: Admitting: Anesthesiology

## 2023-11-05 ENCOUNTER — Encounter
Admission: RE | Disposition: A | Discharge status: Home / Self Care | Payer: Self-pay | Source: Home / Self Care | Attending: Gastroenterology

## 2023-11-05 ENCOUNTER — Ambulatory Visit
Admission: RE | Admit: 2023-11-05 | Discharge: 2023-11-05 | Disposition: A | Discharge status: Home / Self Care | Attending: Gastroenterology | Admitting: Gastroenterology

## 2023-11-05 DIAGNOSIS — K219 Gastro-esophageal reflux disease without esophagitis: Secondary | ICD-10-CM | POA: Diagnosis not present

## 2023-11-05 DIAGNOSIS — I340 Nonrheumatic mitral (valve) insufficiency: Secondary | ICD-10-CM | POA: Diagnosis not present

## 2023-11-05 DIAGNOSIS — K449 Diaphragmatic hernia without obstruction or gangrene: Secondary | ICD-10-CM | POA: Insufficient documentation

## 2023-11-05 DIAGNOSIS — Z791 Long term (current) use of non-steroidal anti-inflammatories (NSAID): Secondary | ICD-10-CM | POA: Insufficient documentation

## 2023-11-05 DIAGNOSIS — D509 Iron deficiency anemia, unspecified: Secondary | ICD-10-CM | POA: Diagnosis not present

## 2023-11-05 DIAGNOSIS — I110 Hypertensive heart disease with heart failure: Secondary | ICD-10-CM | POA: Diagnosis not present

## 2023-11-05 DIAGNOSIS — F32A Depression, unspecified: Secondary | ICD-10-CM | POA: Diagnosis not present

## 2023-11-05 DIAGNOSIS — K5730 Diverticulosis of large intestine without perforation or abscess without bleeding: Secondary | ICD-10-CM | POA: Insufficient documentation

## 2023-11-05 DIAGNOSIS — M810 Age-related osteoporosis without current pathological fracture: Secondary | ICD-10-CM | POA: Insufficient documentation

## 2023-11-05 DIAGNOSIS — I5032 Chronic diastolic (congestive) heart failure: Secondary | ICD-10-CM | POA: Diagnosis not present

## 2023-11-05 DIAGNOSIS — K648 Other hemorrhoids: Secondary | ICD-10-CM | POA: Diagnosis not present

## 2023-11-05 DIAGNOSIS — K644 Residual hemorrhoidal skin tags: Secondary | ICD-10-CM | POA: Insufficient documentation

## 2023-11-05 DIAGNOSIS — I2510 Atherosclerotic heart disease of native coronary artery without angina pectoris: Secondary | ICD-10-CM | POA: Diagnosis not present

## 2023-11-05 DIAGNOSIS — K279 Peptic ulcer, site unspecified, unspecified as acute or chronic, without hemorrhage or perforation: Secondary | ICD-10-CM | POA: Insufficient documentation

## 2023-11-05 DIAGNOSIS — D649 Anemia, unspecified: Secondary | ICD-10-CM | POA: Insufficient documentation

## 2023-11-05 DIAGNOSIS — K3189 Other diseases of stomach and duodenum: Secondary | ICD-10-CM | POA: Insufficient documentation

## 2023-11-05 DIAGNOSIS — I272 Pulmonary hypertension, unspecified: Secondary | ICD-10-CM | POA: Diagnosis present

## 2023-11-05 DIAGNOSIS — E119 Type 2 diabetes mellitus without complications: Secondary | ICD-10-CM | POA: Diagnosis not present

## 2023-11-05 DIAGNOSIS — Z79899 Other long term (current) drug therapy: Secondary | ICD-10-CM | POA: Insufficient documentation

## 2023-11-05 DIAGNOSIS — Z860101 Personal history of adenomatous and serrated colon polyps: Secondary | ICD-10-CM | POA: Insufficient documentation

## 2023-11-05 DIAGNOSIS — R519 Headache, unspecified: Secondary | ICD-10-CM | POA: Diagnosis not present

## 2023-11-05 DIAGNOSIS — R1314 Dysphagia, pharyngoesophageal phase: Secondary | ICD-10-CM | POA: Diagnosis not present

## 2023-11-05 DIAGNOSIS — F419 Anxiety disorder, unspecified: Secondary | ICD-10-CM | POA: Insufficient documentation

## 2023-11-05 HISTORY — PX: COLONOSCOPY: SHX5424

## 2023-11-05 HISTORY — PX: ESOPHAGOGASTRODUODENOSCOPY: SHX5428

## 2023-11-05 SURGERY — COLONOSCOPY
Anesthesia: General

## 2023-11-05 MED ORDER — LIDOCAINE HCL (PF) 2 % IJ SOLN
INTRAMUSCULAR | Status: AC
  Filled 2023-11-05: qty 5

## 2023-11-05 MED ORDER — LIDOCAINE HCL (CARDIAC) PF 100 MG/5ML IV SOSY
PREFILLED_SYRINGE | INTRAVENOUS | Status: DC | PRN
  Administered 2023-11-05: 60 mg via INTRAVENOUS

## 2023-11-05 MED ORDER — PROPOFOL 500 MG/50ML IV EMUL
INTRAVENOUS | Status: DC | PRN
  Administered 2023-11-05: 100 ug/kg/min via INTRAVENOUS
  Administered 2023-11-05: 50 mg via INTRAVENOUS
  Administered 2023-11-05: 20 mg via INTRAVENOUS
  Administered 2023-11-05: 40 mg via INTRAVENOUS

## 2023-11-05 MED ORDER — PROPOFOL 10 MG/ML IV BOLUS
INTRAVENOUS | Status: DC | PRN
  Administered 2023-11-05: 20 mg via INTRAVENOUS
  Administered 2023-11-05: 40 mg via INTRAVENOUS
  Administered 2023-11-05: 60 mg via INTRAVENOUS
  Administered 2023-11-05: 40 mg via INTRAVENOUS
  Administered 2023-11-05 (×2): 20 mg via INTRAVENOUS

## 2023-11-05 MED ORDER — SODIUM CHLORIDE 0.9 % IV SOLN
INTRAVENOUS | Status: DC
  Administered 2023-11-05: 20 mL/h via INTRAVENOUS

## 2023-11-05 NOTE — Anesthesia Procedure Notes (Signed)
 Procedure Name: MAC Date/Time: 11/05/2023 9:26 AM  Performed by: Lacretia Camelia NOVAK, CRNAPre-anesthesia Checklist: Patient identified, Emergency Drugs available, Suction available and Patient being monitored Patient Re-evaluated:Patient Re-evaluated prior to induction Oxygen Delivery Method: Simple face mask

## 2023-11-05 NOTE — Transfer of Care (Signed)
 Immediate Anesthesia Transfer of Care Note  Patient: Ashley Ryan  Procedure(s) Performed: COLONOSCOPY EGD (ESOPHAGOGASTRODUODENOSCOPY)  Patient Location: Endoscopy Unit  Anesthesia Type:General  Level of Consciousness: awake and patient cooperative  Airway & Oxygen Therapy: Patient Spontanous Breathing  Post-op Assessment: Report given to RN and Post -op Vital signs reviewed and stable  Post vital signs: Reviewed and stable  Last Vitals:  Vitals Value Taken Time  BP 175/74 11/05/23 10:02  Temp 35.6 C 11/05/23 10:01  Pulse 86 11/05/23 10:02  Resp 18 11/05/23 10:02  SpO2 98 % 11/05/23 10:02    Last Pain:  Vitals:   11/05/23 1002  TempSrc:   PainSc: 0-No pain         Complications: No notable events documented.

## 2023-11-05 NOTE — H&P (Signed)
 Corinn JONELLE Brooklyn, MD [facility], DHIP [address]  [place], [place] [address]  Main: [phone] Fax:  [phone] Pager: [phone]   Primary Care Physician:  Avelina Greig BRAVO, MD Primary Gastroenterologist:  Dr. Corinn JONELLE Brooklyn  Pre-Procedure History & Physical: HPI:  Ashley Ryan is a 78 y.o. female is here for an endoscopy and colonoscopy.   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  Mastectomy/ SLN.   Chest pain    a. 10/2017 Cath: Nl cors.   Chronic headache    Chronic heart failure with preserved ejection fraction (HFpEF) (HCC)    a. 10/2021 Echo: EF 60-65%, no rwma, mild LVH, GrI DD, nl RV size/fxn, mild MR.   Complication of anesthesia    prior to 1991 used to have PONV.  none recently.   Diverticulosis of colon    Family history of adverse reaction to anesthesia    mother/daighter get sick   GERD with stricture    Hard of hearing    Hiatal hernia    Hypokalemia    Labile hypertension    a. 10/2017 Cath: nl renal arteries.   Mitral regurgitation    a. 10/2021 Echo: Mild MR.   Mixed hyperlipidemia    Neuropathy    feet   Osteoporosis    Pre-diabetes    Pulmonary hypertension (HCC)    a. 10/2017 Echo: PASP 35-41mmHg.   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   CATARACT EXTRACTION W/PHACO Left 02/21/2016   Procedure: CATARACT EXTRACTION PHACO AND INTRAOCULAR LENS PLACEMENT (IOC)  Left eye;  Surgeon: Dene Etienne, MD;  Location: [facility];  Service: Ophthalmology;  Laterality: Left;  Left eye Diabetic   CATARACT EXTRACTION  W/PHACO Right 03/13/2016   Procedure: CATARACT EXTRACTION PHACO AND INTRAOCULAR LENS PLACEMENT (IOC)  right  diabetic;  Surgeon: Dene Etienne, MD;  Location: [facility];  Service: Ophthalmology;  Laterality: Right;  diabetic   CHOLECYSTECTOMY  1978   COLONOSCOPY W/ POLYPECTOMY  09/09/2013   8 mm tubular adenoma of the cecum.  Lamar Aho, MD. [facility]   COLONOSCOPY WITH PROPOFOL  N/A 06/04/2019   Procedure: COLONOSCOPY WITH PROPOFOL ;  Surgeon: Jinny Carmine, MD;  Location: [facility] ENDOSCOPY;  Service: Endoscopy;  Laterality: N/A;   ESOPHAGOGASTRODUODENOSCOPY (EGD) WITH PROPOFOL  N/A 06/04/2019   Procedure: ESOPHAGOGASTRODUODENOSCOPY (EGD) WITH PROPOFOL ;  Surgeon: Jinny Carmine, MD;  Location: [facility] ENDOSCOPY;  Service: Endoscopy;  Laterality: N/A;   JOINT REPLACEMENT Left    knee    LEFT HEART CATH AND CORONARY ANGIOGRAPHY N/A 11/06/2017   Procedure: LEFT HEART CATH AND CORONARY ANGIOGRAPHY;  Surgeon: Mady Bruckner, MD;  Location: [facility] INVASIVE CV LAB;  Service: Cardiovascular;  Laterality: N/A;   MASTECTOMY Left 06/13/2017   mastectomy neg margins   OOPHORECTOMY     one still left   RENAL ANGIOGRAPHY  11/06/2017   Procedure: RENAL ANGIOGRAPHY;  Surgeon: Mady Bruckner, MD;  Location: [facility] INVASIVE CV LAB;  Service: Cardiovascular;;   SENTINEL NODE BIOPSY Left 06/13/2017   Procedure: [employer]  NODE BIOPSY;  Surgeon: Dessa Reyes ORN, MD;  Location: [facility] ORS;  Service: General;  Laterality: Left;   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  Surgeon: Dessa Reyes ORN, MD;  Location: [facility] ORS;  Service: General;  Laterality: Left;    Prior to Admission medications   Medication Sig Start Date End Date Taking? Authorizing Provider  Alcohol Swabs (DROPSAFE ALCOHOL PREP) 70 % PADS USE  TO TEST BLOOD SUGAR ONE TIME DAILY 06/25/21  Yes Bedsole, Amy E, MD  amLODipine  (NORVASC ) 5 MG tablet TAKE 1 TABLET (5 MG  TOTAL) BY MOUTH DAILY. 02/24/23  Yes End, Lonni, MD  atorvastatin  (LIPITOR) 40 MG tablet TAKE 1 TABLET AT BEDTIME 04/03/23  Yes Bedsole, Amy E, MD  Blood Glucose Calibration (ACCU-CHEK AVIVA) SOLN Use to check blood sugar once daily 10/27/18  Yes Bedsole, Amy E, MD  Blood Glucose Calibration (TRUE METRIX LEVEL 1) Low SOLN USE AS DIRECTED 06/12/20  Yes Bedsole, Amy E, MD  Blood Glucose Monitoring Suppl (TRUE METRIX AIR GLUCOSE METER) w/Device KIT Use to check blood sugar daily 04/30/21  Yes Bedsole, Amy E, MD  brimonidine  (ALPHAGAN ) 0.2 % ophthalmic solution brimonidine  0.2 % eye drops  INSTILL 1 DROP INTO BOTH EYES TWICE A DAY   Yes [provider]  busPIRone (BUSPAR) 5 MG tablet Take 1 tablet (5 mg total) by mouth 2 (two) times daily. 10/30/23  Yes Bedsole, Amy E, MD  Calcium  Carb-Cholecalciferol  (CALCIUM  600 + D) 600-5 MG-MCG TABS Take 2 tablets by mouth daily. 09/12/21  Yes Babara Call, MD  diclofenac  (VOLTAREN ) 75 MG EC tablet TAKE 1 TABLET BY MOUTH TWICE A DAY 08/28/23  Yes Bedsole, Amy E, MD  diclofenac  Sodium (VOLTAREN ) 1 % GEL APPLY 2 GRAMS TOPICALLY TO THE AFFECTED AREA 4 TIMES DAILY. 06/25/21  Yes Bedsole, Amy E, MD  esomeprazole  (NEXIUM ) 40 MG capsule TAKE 1 CAPSULE TWICE DAILY BEFORE MEALS 04/03/23  Yes Bedsole, Amy E, MD  fluticasone  (FLONASE ) 50 MCG/ACT nasal spray USE 2 SPRAYS INTO THE NOSE DAILY AS DIRECTED (SUBSTITUTED FOR FLONASE ) 04/03/23  Yes Bedsole, Amy E, MD  furosemide  (LASIX ) 20 MG tablet TAKE 1 TABLET BY MOUTH EVERY DAY AS NEEDED 01/15/18  Yes Bedsole, Amy E, MD  gabapentin  (NEURONTIN ) 100 MG capsule TAKE 1 CAPSULE EVERY MORNING , 1 CAPSULE AT LUNCH, 1 CAPSULE AT DINNER, AND 2-3 CAPSULES AT BEDTIME 10/02/22  Yes Webb, Padonda B, FNP  Glucosamine 500 MG TABS Take 500 mg by mouth daily.   Yes [provider]  glucose blood (TRUE METRIX BLOOD GLUCOSE TEST) test strip Use to check blood sugar daily 04/30/21  Yes Bedsole, Amy E, MD  Iron -Vitamin C  65-125 MG TABS Take 1  tablet by mouth daily at 2 PM. 03/15/22  Yes Babara Call, MD  isosorbide  mononitrate (IMDUR ) 30 MG 24 hr tablet TAKE 1 TABLET EVERY DAY 05/29/23  Yes Bedsole, Amy E, MD  latanoprost  (XALATAN ) 0.005 % ophthalmic solution Place 1 drop into both eyes at bedtime.   Yes [provider]  lidocaine  (LIDODERM ) 5 % APPLY 1 PATCH ONTO THE SKIN EVERY DAY. REMOVE AND DISCARD PATCH WITHIN 12 HOURS OR AS DIRECTED BY PHYSICIAN 10/02/23  Yes Bedsole, Amy E, MD  losartan -hydrochlorothiazide  (HYZAAR) 100-25 MG tablet Take 1 tablet by mouth daily.   Yes [provider]  mirabegron  ER (MYRBETRIQ ) 25 MG TB24 tablet Take 1 tablet (25 mg total) by mouth daily. 03/19/23  Yes  Bedsole, Amy E, MD  Omega-3 Fatty Acids (FISH OIL) 1000 MG CAPS Take 1,000 mg by mouth daily.    Yes [provider]  polycarbophil (FIBERCON) 625 MG tablet Take 625 mg by mouth daily.   Yes [provider]  potassium chloride  SA (KLOR-CON  M20) 20 MEQ tablet TAKE 1 TABLET BY MOUTH TWICE A DAY 02/06/23  Yes End, Lonni, MD  traMADol  (ULTRAM ) 50 MG tablet Take 1 tablet (50 mg total) by mouth every 6 (six) hours as needed. for pain 09/16/23  Yes Bedsole, Amy E, MD  triamcinolone  cream (KENALOG ) 0.5 % Apply 1 application topically 2 (two) times daily. 08/12/19  Yes Bedsole, Amy E, MD  TRUEplus Lancets 28G MISC TEST BLOOD SUGAR EVERY DAY 06/28/21  Yes Bedsole, Amy E, MD    Allergies as of 09/24/2023 - Review Complete 07/11/2023  Allergen Reaction Noted   Oxycodone  Nausea Only 12/10/2019   Sulfa antibiotics Rash 09/09/2013   Sulfonamide derivatives Rash     Family History  Problem Relation Age of Onset   Breast cancer Other    Stomach cancer Other    Diabetes Other    Heart disease Other    Breast cancer Sister 37   Esophageal cancer Brother    Hypertension Mother    Heart attack Mother 60   Hypertension Father    Coronary artery disease Father 22       CABG   Stroke Father     Social History   Socioeconomic  History   Marital status: Married    Spouse name: Not on file   Number of children: 2   Years of education: Not on file   Highest education level: Not on file  Occupational History   Occupation: retired cna    Employer: RETIRED  Tobacco Use   Smoking status: Never   Smokeless tobacco: Never  Vaping Use   Vaping status: Never Used  Substance and Sexual Activity   Alcohol use: No   Drug use: No   Sexual activity: Not Currently  Other Topics Concern   Not on file  Social History Narrative   Drinks sweet tea, diet sodas 4 a day   No living will, full code (reviewed 2014)   Social Drivers of Corporate investment banker Strain: Low Risk  (10/21/2023)   Overall Financial Resource Strain (CARDIA)    Difficulty of Paying Living Expenses: Not hard at all  Recent Concern: Financial Resource Strain - Medium Risk (09/26/2023)   Received from [facility]   Overall Financial Resource Strain (CARDIA)    Difficulty of Paying Living Expenses: Somewhat hard  Food Insecurity: No Food Insecurity (10/21/2023)   Hunger Vital Sign    Worried About Running Out of Food in the Last Year: Never true    Ran Out of Food in the Last Year: Never true  Recent Concern: Food Insecurity - Food Insecurity Present (09/26/2023)   Received from [facility]   Hunger Vital Sign    Within the past 12 months, you worried that your food would run out before you got the money to buy more.: Sometimes true    Within the past 12 months, the food you bought just didn't last and you didn't have money to get more.: Sometimes true  Transportation Needs: No Transportation Needs (10/21/2023)   PRAPARE - Administrator, Civil Service (Medical): No    Lack of Transportation (Non-Medical): No  Physical Activity: Patient Declined (10/21/2023)  Exercise Vital Sign    Days of Exercise per Week: Patient declined    Minutes of Exercise per Session: Patient declined  Recent Concern:  Physical Activity - Inactive (09/26/2023)   Received from [facility]   Exercise Vital Sign    On average, how many days per week do you engage in moderate to strenuous exercise (like a brisk walk)?: 0 days    On average, how many minutes do you engage in exercise at this level?: 0 min  Stress: No Stress Concern Present (10/21/2023)   [employer] of Occupational Health - Occupational Stress Questionnaire    Feeling of Stress: Not at all  Social Connections: Moderately Isolated (10/21/2023)   Social Connection and Isolation Panel    Frequency of Communication with Friends and Family: More than three times a week    Frequency of Social Gatherings with Friends and Family: Once a week    Attends Religious Services: Never    Database administrator or Organizations: No    Attends Banker Meetings: Never    Marital Status: Married  Catering manager Violence: Not At Risk (10/21/2023)   Humiliation, Afraid, Rape, and Kick questionnaire    Fear of Current or Ex-Partner: No    Emotionally Abused: No    Physically Abused: No    Sexually Abused: No    Review of Systems: See HPI, otherwise negative ROS  Physical Exam: BP (!) 154/84   Pulse (!) 115   Temp (!) 96.6 F (35.9 C) (Temporal)   Resp 20   Ht 4' 11 (1.499 m)   Wt 52.4 kg   SpO2 99%   BMI 23.35 kg/m  General:   Alert,  pleasant and cooperative in NAD Head:  Normocephalic and atraumatic. Neck:  Supple; no masses or thyromegaly. Lungs:  Clear throughout to auscultation.    Heart:  Regular rate and rhythm. Abdomen:  Soft, nontender and nondistended. Normal bowel sounds, without guarding, and without rebound.   Neurologic:  Alert and  oriented x4;  grossly normal neurologically.  Impression/Plan: TABRINA ESTY is here for an endoscopy and colonoscopy to be performed for IDA, h/o colon adenomas  Risks, benefits, limitations, and alternatives regarding  endoscopy and colonoscopy have been reviewed  with the patient.  Questions have been answered.  All parties agreeable.   Corinn Brooklyn, MD  11/05/2023, 8:27 AM

## 2023-11-05 NOTE — Anesthesia Preprocedure Evaluation (Addendum)
 Anesthesia Evaluation  Patient identified by MRN, date of birth, ID band Patient awake    Reviewed: Allergy & Precautions, NPO status , Patient's Chart, lab work & pertinent test results  History of Anesthesia Complications (+) PONV and history of anesthetic complications  Airway Mallampati: III  TM Distance: >3 FB Neck ROM: full    Dental  (+) Edentulous Upper, Edentulous Lower   Pulmonary neg pulmonary ROS   Pulmonary exam normal        Cardiovascular hypertension, On Medications pulmonary hypertension+ CAD and +CHF  Normal cardiovascular exam  Echo 2023  IMPRESSIONS     1. Left ventricular ejection fraction, by estimation, is 60 to 65%. The  left ventricle has normal function. The left ventricle has no regional  wall motion abnormalities. There is mild left ventricular hypertrophy.  Left ventricular diastolic parameters  are consistent with Grade I diastolic dysfunction (impaired relaxation).   2. Right ventricular systolic function is normal. The right ventricular  size is normal. Tricuspid regurgitation signal is inadequate for assessing  PA pressure.   3. The mitral valve is normal in structure. Mild mitral valve  regurgitation. No evidence of mitral stenosis.   4. The aortic valve is tricuspid. Aortic valve regurgitation is not  visualized. No aortic stenosis is present.   5. The inferior vena cava is normal in size with greater than 50%  respiratory variability, suggesting right atrial pressure of 3 mmHg.      Neuro/Psych  Headaches PSYCHIATRIC DISORDERS Anxiety Depression     Neuromuscular disease    GI/Hepatic Neg liver ROS, hiatal hernia, PUD,GERD  ,,  Endo/Other  diabetes    Renal/GU negative Renal ROS  negative genitourinary   Musculoskeletal   Abdominal   Peds  Hematology  (+) Blood dyscrasia, anemia   Anesthesia Other Findings Past Medical History: No date: Anemia No date:  Arthritis 06/13/2017: Breast cancer (HCC)     Comment:  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  Mastectomy/              SLN. No date: Chest pain     Comment:  a. 10/2017 Cath: Nl cors. No date: Chronic headache No date: Chronic heart failure with preserved ejection fraction  (HFpEF) (HCC)     Comment:  a. 10/2021 Echo: EF 60-65%, no rwma, mild LVH, GrI DD,               nl RV size/fxn, mild MR. No date: Complication of anesthesia     Comment:  prior to 1991 used to have PONV.  none recently. No date: Diverticulosis of colon No date: Family history of adverse reaction to anesthesia     Comment:  mother/daighter get sick No date: GERD with stricture No date: Hard of hearing No date: Hiatal hernia No date: Hypokalemia No date: Labile hypertension     Comment:  a. 10/2017 Cath: nl renal arteries. No date: Mitral regurgitation     Comment:  a. 10/2021 Echo: Mild MR. No date: Mixed hyperlipidemia No date: Neuropathy     Comment:  feet No date: Osteoporosis No date: Pre-diabetes No date: Pulmonary hypertension (HCC)     Comment:  a. 10/2017 Echo: PASP 35-55mmHg. No date: Wears dentures     Comment:  full upper  Past Surgical History: 1978: ABDOMINAL HYSTERECTOMY     Comment:  one ovary remains 05/26/2017: BREAST BIOPSY; Left     Comment:  Affirm Bx- coil clip Ductal carcinoma in situ, high  nuclear grade with calcifications and focal               comedonecrosis No date: CARDIAC CATHETERIZATION     Comment:  yrs ago all OK. 02/21/2016: CATARACT EXTRACTION W/PHACO; Left     Comment:  Procedure: CATARACT EXTRACTION PHACO AND INTRAOCULAR               LENS PLACEMENT (IOC)  Left eye;  Surgeon: Dene Etienne, MD;  Location: [facility];  Service:              Ophthalmology;  Laterality: Left;  Left eye Diabetic 03/13/2016: CATARACT EXTRACTION W/PHACO; Right     Comment:  Procedure: CATARACT EXTRACTION PHACO AND INTRAOCULAR                LENS PLACEMENT (IOC)  right  diabetic;  Surgeon: Dene Etienne, MD;  Location: [facility];  Service:              Ophthalmology;  Laterality: Right;  diabetic 1978: CHOLECYSTECTOMY 09/09/2013: COLONOSCOPY W/ POLYPECTOMY     Comment:  8 mm tubular adenoma of the cecum.  Lamar Aho, MD.               [place] [facility] 06/04/2019: COLONOSCOPY WITH PROPOFOL ; N/A     Comment:  Procedure: COLONOSCOPY WITH PROPOFOL ;  Surgeon: Jinny Carmine, MD;  Location: [facility] ENDOSCOPY;  Service:               Endoscopy;  Laterality: N/A; 06/04/2019: ESOPHAGOGASTRODUODENOSCOPY (EGD) WITH PROPOFOL ; N/A     Comment:  Procedure: ESOPHAGOGASTRODUODENOSCOPY (EGD) WITH               PROPOFOL ;  Surgeon: Jinny Carmine, MD;  Location: [facility]               ENDOSCOPY;  Service: Endoscopy;  Laterality: N/A; No date: JOINT REPLACEMENT; Left     Comment:  knee  11/06/2017: LEFT HEART CATH AND CORONARY ANGIOGRAPHY; N/A     Comment:  Procedure: LEFT HEART CATH AND CORONARY ANGIOGRAPHY;                Surgeon: Mady Bruckner, MD;  Location: [facility] INVASIVE               CV LAB;  Service: Cardiovascular;  Laterality: N/A; 06/13/2017: MASTECTOMY; Left     Comment:  mastectomy neg margins No date: OOPHORECTOMY     Comment:  one still left 11/06/2017: RENAL ANGIOGRAPHY     Comment:  Procedure: RENAL ANGIOGRAPHY;  Surgeon: Mady Bruckner, MD;  Location: [facility] INVASIVE CV LAB;                Service: Cardiovascular;; 06/13/2017: SENTINEL NODE BIOPSY; Left     Comment:  Procedure: SENTINEL NODE BIOPSY;  Surgeon: Dessa Reyes ORN, MD;  Location: [facility] ORS;  Service: General;                Laterality: Left; 06/13/2017: SIMPLE MASTECTOMY WITH AXILLARY SENTINEL NODE BIOPSY; Left     Comment:  8 mm high grade DCIS, negative SLN. ER/PR+.  Surgeon:  Dessa Reyes ORN, MD;  Location: [facility] ORS;  Service:               General;  Laterality: Left;  BMI     Body Mass Index: 23.35 kg/m      Reproductive/Obstetrics negative OB ROS                              Anesthesia Physical Anesthesia Plan  ASA: 3  Anesthesia Plan: General   Post-op Pain Management: Minimal or no pain anticipated   Induction: Intravenous  PONV Risk Score and Plan: 3 and Propofol  infusion and TIVA  Airway Management Planned: Natural Airway and Nasal Cannula  Additional Equipment:   Intra-op Plan:   Post-operative Plan:   Informed Consent: I have reviewed the patients History and Physical, chart, labs and discussed the procedure including the risks, benefits and alternatives for the proposed anesthesia with the patient or authorized representative who has indicated his/her understanding and acceptance.     Dental Advisory Given  Plan Discussed with: Anesthesiologist, CRNA and Surgeon  Anesthesia Plan Comments: (Patient consented for risks of anesthesia including but not limited to:  - adverse reactions to medications - risk of airway placement if required - damage to eyes, teeth, lips or other oral mucosa - nerve damage due to positioning  - sore throat or hoarseness - Damage to heart, brain, nerves, lungs, other parts of body or loss of life  Patient voiced understanding and assent.)         Anesthesia Quick Evaluation

## 2023-11-05 NOTE — Anesthesia Postprocedure Evaluation (Signed)
 Anesthesia Post Note  Patient: Ashley Ryan  Procedure(s) Performed: COLONOSCOPY EGD (ESOPHAGOGASTRODUODENOSCOPY)  Patient location during evaluation: Endoscopy Anesthesia Type: General Level of consciousness: awake and alert Pain management: pain level controlled Vital Signs Assessment: post-procedure vital signs reviewed and stable Respiratory status: spontaneous breathing, nonlabored ventilation, respiratory function stable and patient connected to nasal cannula oxygen Cardiovascular status: blood pressure returned to baseline and stable Postop Assessment: no apparent nausea or vomiting Anesthetic complications: no   No notable events documented.   Last Vitals:  Vitals:   11/05/23 1001 11/05/23 1002  BP: (!) 175/74 (!) 175/74  Pulse: 91 86  Resp: (!) 25 18  Temp: (!) 35.6 C   SpO2:  98%    Last Pain:  Vitals:   11/05/23 1021  TempSrc:   PainSc: 0-No pain                 Lendia LITTIE Mae

## 2023-11-05 NOTE — Op Note (Signed)
 [facility] Gastroenterology Patient Name: Ashley Ryan Procedure Date: 11/05/2023 9:16 AM MRN: [id number] Account #: [id number] Date of Birth: 1945/11/29 Admit Type: Outpatient Age: 78 Room: [facility] ENDO ROOM 2 Gender: Female Note Status: Finalized Instrument Name: Upper GI Scope [id number] Procedure:             Upper GI endoscopy Indications:           Unexplained iron  deficiency anemia, Esophageal                         dysphagia Providers:             Corinn Jess Brooklyn MD, MD Referring MD:          Greig CHARLENA Ring MD, MD (Referring MD) Medicines:             General Anesthesia Complications:         No immediate complications. Estimated blood loss: None. Procedure:             Pre-Anesthesia Assessment:                        - Prior to the procedure, a History and Physical was                         performed, and patient medications and allergies were                         reviewed. The patient is competent. The risks and                         benefits of the procedure and the sedation options and                         risks were discussed with the patient. All questions                         were answered and informed consent was obtained.                         Patient identification and proposed procedure were                         verified by the physician, the nurse, the                         anesthesiologist, the anesthetist and the technician                         in the pre-procedure area in the procedure room in the                         endoscopy suite. Mental Status Examination: alert and                         oriented. Airway Examination: normal oropharyngeal                         airway and neck mobility. Respiratory Examination:  clear to auscultation. CV Examination: normal.                         Prophylactic Antibiotics: The patient does not require                         prophylactic antibiotics.  Prior Anticoagulants: The                         patient has taken no anticoagulant or antiplatelet                         agents. ASA Grade Assessment: III - A patient with                         severe systemic disease. After reviewing the risks and                         benefits, the patient was deemed in satisfactory                         condition to undergo the procedure. The anesthesia                         plan was to use general anesthesia. Immediately prior                         to administration of medications, the patient was                         re-assessed for adequacy to receive sedatives. The                         heart rate, respiratory rate, oxygen saturations,                         blood pressure, adequacy of pulmonary ventilation, and                         response to care were monitored throughout the                         procedure. The physical status of the patient was                         re-assessed after the procedure.                        After obtaining informed consent, the endoscope was                         passed under direct vision. Throughout the procedure,                         the patient's blood pressure, pulse, and oxygen                         saturations were monitored continuously. The Endoscope  was introduced through the mouth, and advanced to the                         second part of duodenum. The upper GI endoscopy was                         accomplished without difficulty. The patient tolerated                         the procedure well. Findings:      The duodenal bulb and second portion of the duodenum were normal.       Biopsies for histology were taken with a cold forceps for evaluation of       celiac disease.      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      Striped mildly erythematous mucosa without bleeding was found in the       gastric antrum. Biopsies  were taken with a cold forceps for histology.      The gastroesophageal junction and examined esophagus were normal. Impression:            - Normal duodenal bulb and second portion of the                         duodenum. Biopsied.                        - Large paraesophageal hernia.                        - Erythematous mucosa in the antrum. Biopsied.                        - Normal gastroesophageal junction and esophagus. Recommendation:        - Await pathology results.                        - Follow an antireflux regimen for the rest of the                         patient's life.                        - Continue present medications.                        - Use a proton pump inhibitor PO BID for the rest of                         the patient's life.                        - Refer to a surgeon at appointment to be scheduled to                         evaluate for hernia repair.                        - Proceed with colonoscopy as scheduled  See colonoscopy report Procedure Code(s):     --- Professional ---                        [phone], Esophagogastroduodenoscopy, flexible,                         transoral; with biopsy, single or multiple Diagnosis Code(s):     --- Professional ---                        K44.9, Diaphragmatic hernia without obstruction or                         gangrene                        K31.89, Other diseases of stomach and duodenum                        D50.9, Iron  deficiency anemia, unspecified                        R13.14, Dysphagia, pharyngoesophageal phase CPT copyright 2022 [employer]. All rights reserved. The codes documented in this report are preliminary and upon coder review may  be revised to meet current compliance requirements. Dr. Corinn Brooklyn Corinn Jess Brooklyn MD, MD 11/05/2023 9:42:08 AM This report has been signed electronically. Number of Addenda: 0 Note Initiated On: 11/05/2023 9:16  AM Estimated Blood Loss:  Estimated blood loss: none. Estimated blood loss: none.      [facility]

## 2023-11-05 NOTE — Op Note (Addendum)
 [facility] Gastroenterology Patient Name: Ashley Ryan Procedure Date: 11/05/2023 9:12 AM MRN: [id number] Account #: [id number] Date of Birth: 1945/08/15 Admit Type: Outpatient Age: 78 Room: [facility] ENDO ROOM 2 Gender: Female Note Status: Supervisor Override Instrument Name: Peds Colonoscope [id number] Procedure:             Colonoscopy Indications:           Last colonoscopy: May 2021, Unexplained iron                          deficiency anemia, h/o adenomatous colonic polyps Providers:             Corinn Jess Brooklyn MD, MD Referring MD:          Greig CHARLENA Ring MD, MD (Referring MD) Medicines:             General Anesthesia Complications:         No immediate complications. Estimated blood loss: None. Procedure:             Pre-Anesthesia Assessment:                        - Prior to the procedure, a History and Physical was                         performed, and patient medications and allergies were                         reviewed. The patient is competent. The risks and                         benefits of the procedure and the sedation options and                         risks were discussed with the patient. All questions                         were answered and informed consent was obtained.                         Patient identification and proposed procedure were                         verified by the physician, the nurse, the                         anesthesiologist, the anesthetist and the technician                         in the pre-procedure area in the procedure room in the                         endoscopy suite. Mental Status Examination: alert and                         oriented. Airway Examination: normal oropharyngeal                         airway and neck mobility. Respiratory Examination:  clear to auscultation. CV Examination: normal.                         Prophylactic Antibiotics: The patient does not require                          prophylactic antibiotics. Prior Anticoagulants: The                         patient has taken no anticoagulant or antiplatelet                         agents. ASA Grade Assessment: III - A patient with                         severe systemic disease. After reviewing the risks and                         benefits, the patient was deemed in satisfactory                         condition to undergo the procedure. The anesthesia                         plan was to use general anesthesia. Immediately prior                         to administration of medications, the patient was                         re-assessed for adequacy to receive sedatives. The                         heart rate, respiratory rate, oxygen saturations,                         blood pressure, adequacy of pulmonary ventilation, and                         response to care were monitored throughout the                         procedure. The physical status of the patient was                         re-assessed after the procedure.                        After obtaining informed consent, the colonoscope was                         passed under direct vision. Throughout the procedure,                         the patient's blood pressure, pulse, and oxygen                         saturations were monitored continuously. The  Colonoscope was introduced through the anus and                         advanced to the the terminal ileum, with                         identification of the appendiceal orifice and IC                         valve. The colonoscopy was performed with moderate                         difficulty due to multiple diverticula in the colon                         and significant looping. Successful completion of the                         procedure was aided by applying abdominal pressure.                         The patient tolerated the procedure well. The quality                          of the bowel preparation was evaluated using the BBPS                         [facility] Bowel Preparation Scale) with scores of: Right                         Colon = 3, Transverse Colon = 3 and Left Colon = 3                         (entire mucosa seen well with no residual staining,                         small fragments of stool or opaque liquid). The total                         BBPS score equals 9. The terminal ileum, ileocecal                         valve, appendiceal orifice, and rectum were                         photographed. Findings:      The perianal and digital rectal examinations were normal. Pertinent       negatives include normal sphincter tone and no palpable rectal lesions.      The terminal ileum appeared normal.      Multiple small-mouthed diverticula were found in the recto-sigmoid colon       and sigmoid colon.      Non-bleeding external internal hemorrhoids were found during endoscopy.       The hemorrhoids were medium-sized.      The exam was otherwise without abnormality. Impression:            - The examined portion of the ileum was normal.                        -  Diverticulosis in the recto-sigmoid colon and in the                         sigmoid colon.                        - Non-bleeding external internal hemorrhoids.                        - The examination was otherwise normal.                        - No specimens collected. Recommendation:        - Discharge patient to home (with escort).                        - High fiber diet for the rest of the patient's life.                        - Miralax 1 capful (17 grams) in 8 ounces of water PO                         daily. Procedure Code(s):     --- Professional ---                        [phone], Colonoscopy, flexible; diagnostic, including                         collection of specimen(s) by brushing or washing, when                         performed (separate procedure) Diagnosis  Code(s):     --- Professional ---                        K64.4, Residual hemorrhoidal skin tags                        K64.8, Other hemorrhoids                        K57.30, Diverticulosis of large intestine without                         perforation or abscess without bleeding                        D50.9, Iron  deficiency anemia, unspecified CPT copyright 2022 American Medical Association. All rights reserved. The codes documented in this report are preliminary and upon coder review may  be revised to meet current compliance requirements. Dr. Corinn Brooklyn Corinn Jess Brooklyn MD, MD 11/05/2023 10:02:29 AM This report has been signed electronically. Number of Addenda: 0 Note Initiated On: 11/05/2023 9:12 AM Scope Withdrawal Time: 0 hours 5 minutes 8 seconds  Total Procedure Duration: 0 hours 12 minutes 1 second  Estimated Blood Loss:  Estimated blood loss: none.      [facility]

## 2023-11-06 ENCOUNTER — Ambulatory Visit: Payer: Self-pay | Admitting: Gastroenterology

## 2023-11-06 LAB — SURGICAL PATHOLOGY

## 2023-11-12 DIAGNOSIS — Z20828 Contact with and (suspected) exposure to other viral communicable diseases: Secondary | ICD-10-CM | POA: Diagnosis not present

## 2023-11-16 ENCOUNTER — Other Ambulatory Visit: Payer: Self-pay | Admitting: Internal Medicine

## 2023-11-22 ENCOUNTER — Other Ambulatory Visit: Payer: Self-pay | Admitting: Family Medicine

## 2023-11-26 ENCOUNTER — Other Ambulatory Visit: Payer: Self-pay | Admitting: Internal Medicine

## 2023-12-01 ENCOUNTER — Other Ambulatory Visit: Payer: Self-pay | Admitting: Family Medicine

## 2023-12-01 MED ORDER — GABAPENTIN 100 MG PO CAPS: ORAL_CAPSULE | ORAL | 1 refills | Status: DC

## 2023-12-01 NOTE — Telephone Encounter (Signed)
 Copied from CRM [id number]. Topic: Clinical - Medication Refill >> Dec 01, 2023  9:31 AM Tiffini S wrote: Medication:  gabapentin  (NEURONTIN ) 100 MG capsule   Has the patient contacted their pharmacy? Yes (Agent: If no, request that the patient contact the pharmacy for the refill. If patient does not wish to contact the pharmacy document the reason why and proceed with request.) (Agent: If yes, when and what did the pharmacy advise?)  This is the patient's preferred pharmacy:  [id number] - [place], [place] - [address] [place] [address] Phone: [phone] Fax: [phone]   Is this the correct pharmacy for this prescription? Yes If no, delete pharmacy and type the correct one.   Has the prescription been filled recently? Yes  Is the patient out of the medication? Yes  Has the patient been seen for an appointment in the last year OR does the patient have an upcoming appointment? Yes  Can we respond through MyChart? No, please call the patient at [phone]  Agent: Please be advised that Rx refills may take up to 3 business days. We ask that you follow-up with your pharmacy.

## 2023-12-05 ENCOUNTER — Encounter: Payer: Self-pay | Admitting: Pharmacist

## 2023-12-05 NOTE — Progress Notes (Signed)
 Pharmacy Quality Measure Review  This patient is appearing on a report for being at risk of failing the Controlling Blood Pressure measure this calendar year.   Last documented BP BP Readings from Last 1 Encounters:  11/05/23 (!) 175/74  >140/90  2025 follow up: YES Cancer center 11/19  KED: Pass Lab Results  Component Value Date   GFR 86.61 02/18/2023   Lab Results  Component Value Date   MICRALBCREAT 13.3 09/16/2023    SPC/SUPD: [facility]

## 2023-12-08 ENCOUNTER — Inpatient Hospital Stay: Attending: Oncology

## 2023-12-08 DIAGNOSIS — E876 Hypokalemia: Secondary | ICD-10-CM | POA: Insufficient documentation

## 2023-12-08 DIAGNOSIS — D509 Iron deficiency anemia, unspecified: Secondary | ICD-10-CM | POA: Insufficient documentation

## 2023-12-08 DIAGNOSIS — Z86000 Personal history of in-situ neoplasm of breast: Secondary | ICD-10-CM | POA: Insufficient documentation

## 2023-12-08 DIAGNOSIS — M810 Age-related osteoporosis without current pathological fracture: Secondary | ICD-10-CM | POA: Insufficient documentation

## 2023-12-08 DIAGNOSIS — E538 Deficiency of other specified B group vitamins: Secondary | ICD-10-CM | POA: Insufficient documentation

## 2023-12-09 ENCOUNTER — Inpatient Hospital Stay

## 2023-12-09 DIAGNOSIS — D0512 Intraductal carcinoma in situ of left breast: Secondary | ICD-10-CM

## 2023-12-09 DIAGNOSIS — Z86000 Personal history of in-situ neoplasm of breast: Secondary | ICD-10-CM | POA: Diagnosis not present

## 2023-12-09 DIAGNOSIS — M810 Age-related osteoporosis without current pathological fracture: Secondary | ICD-10-CM | POA: Diagnosis not present

## 2023-12-09 DIAGNOSIS — D509 Iron deficiency anemia, unspecified: Secondary | ICD-10-CM | POA: Diagnosis not present

## 2023-12-09 DIAGNOSIS — E538 Deficiency of other specified B group vitamins: Secondary | ICD-10-CM | POA: Diagnosis not present

## 2023-12-09 DIAGNOSIS — E876 Hypokalemia: Secondary | ICD-10-CM | POA: Diagnosis not present

## 2023-12-09 LAB — RETIC PANEL
Immature Retic Fract: 12.7 % (ref 2.3–15.9)
RBC.: 4.31 MIL/uL (ref 3.87–5.11)
Retic Count, Absolute: 83.2 K/uL (ref 19.0–186.0)
Retic Ct Pct: 1.9 % (ref 0.4–3.1)
Reticulocyte Hemoglobin: 27.9 pg — ABNORMAL LOW (ref >27.9)

## 2023-12-09 LAB — CBC WITH DIFFERENTIAL (CANCER CENTER ONLY)
Abs Immature Granulocytes: 0.03 K/uL (ref 0.00–0.07)
Basophils Absolute: 0.1 K/uL (ref 0.0–0.1)
Basophils Relative: 1 %
Eosinophils Absolute: 0.1 K/uL (ref 0.0–0.5)
Eosinophils Relative: 1 %
HCT: 36.6 % (ref 36.0–46.0)
Hemoglobin: 11.5 g/dL — ABNORMAL LOW (ref 12.0–15.0)
Immature Granulocytes: 0 %
Lymphocytes Relative: 27 %
Lymphs Abs: 2.1 K/uL (ref 0.7–4.0)
MCH: 26.7 pg (ref 26.0–34.0)
MCHC: 31.4 g/dL (ref 30.0–36.0)
MCV: 84.9 fL (ref 80.0–100.0)
Monocytes Absolute: 0.7 K/uL (ref 0.1–1.0)
Monocytes Relative: 8 %
Neutro Abs: 4.8 K/uL (ref 1.7–7.7)
Neutrophils Relative %: 63 %
Platelet Count: 195 K/uL (ref 150–400)
RBC: 4.31 MIL/uL (ref 3.87–5.11)
RDW: 16.5 % — ABNORMAL HIGH (ref 11.5–15.5)
WBC Count: 7.8 K/uL (ref 4.0–10.5)
nRBC: 0 % (ref 0.0–0.2)

## 2023-12-09 LAB — IRON AND TIBC
Iron: 49 ug/dL (ref 28–170)
Saturation Ratios: 18 % (ref 10.4–31.8)
TIBC: 281 ug/dL (ref 250–450)
UIBC: 232 ug/dL

## 2023-12-09 LAB — CMP (CANCER CENTER ONLY)
ALT: 12 U/L (ref 0–44)
AST: 17 U/L (ref 15–41)
Albumin: 3.5 g/dL (ref 3.5–5.0)
Alkaline Phosphatase: 67 U/L (ref 38–126)
Anion gap: 11 (ref 5–15)
BUN: 13 mg/dL (ref 8–23)
CO2: 24 mmol/L (ref 22–32)
Calcium: 9 mg/dL (ref 8.9–10.3)
Chloride: 104 mmol/L (ref 98–111)
Creatinine: 0.66 mg/dL (ref 0.44–1.00)
GFR, Estimated: >60 mL/min (ref >60)
Glucose, Bld: 138 mg/dL — ABNORMAL HIGH (ref 70–99)
Potassium: 2.8 mmol/L — ABNORMAL LOW (ref 3.5–5.1)
Sodium: 139 mmol/L (ref 135–145)
Total Bilirubin: 0.8 mg/dL (ref 0.0–1.2)
Total Protein: 5.9 g/dL — ABNORMAL LOW (ref 6.5–8.1)

## 2023-12-09 LAB — FERRITIN: Ferritin: 173 ng/mL (ref 11–307)

## 2023-12-10 ENCOUNTER — Inpatient Hospital Stay: Admitting: Oncology

## 2023-12-10 ENCOUNTER — Inpatient Hospital Stay

## 2023-12-10 ENCOUNTER — Other Ambulatory Visit: Payer: Self-pay | Admitting: Family Medicine

## 2023-12-10 ENCOUNTER — Encounter: Payer: Self-pay | Admitting: Oncology

## 2023-12-10 VITALS — BP 140/79 | HR 78 | Temp 97.2°F | Resp 18

## 2023-12-10 DIAGNOSIS — M810 Age-related osteoporosis without current pathological fracture: Secondary | ICD-10-CM

## 2023-12-10 DIAGNOSIS — E876 Hypokalemia: Secondary | ICD-10-CM

## 2023-12-10 DIAGNOSIS — E538 Deficiency of other specified B group vitamins: Secondary | ICD-10-CM | POA: Diagnosis not present

## 2023-12-10 DIAGNOSIS — D0512 Intraductal carcinoma in situ of left breast: Secondary | ICD-10-CM

## 2023-12-10 DIAGNOSIS — D509 Iron deficiency anemia, unspecified: Secondary | ICD-10-CM | POA: Diagnosis not present

## 2023-12-10 MED ORDER — ZOLEDRONIC ACID 4 MG/5ML IV CONC
3.3000 mg | Freq: Once | INTRAVENOUS | Status: AC
  Administered 2023-12-10: 3.3 mg via INTRAVENOUS
  Filled 2023-12-10: qty 4.13

## 2023-12-10 MED ORDER — SODIUM CHLORIDE 0.9 % IV SOLN
Freq: Once | INTRAVENOUS | Status: AC
  Filled 2023-12-10: qty 250

## 2023-12-10 MED ORDER — POTASSIUM CHLORIDE 10 MEQ/100ML IV SOLN
10.0000 meq | Freq: Once | INTRAVENOUS | Status: AC
  Administered 2023-12-10: 10 meq via INTRAVENOUS
  Filled 2023-12-10: qty 100

## 2023-12-10 MED ORDER — CYANOCOBALAMIN 1000 MCG/ML IJ SOLN
1000.0000 ug | Freq: Once | INTRAMUSCULAR | Status: AC
  Administered 2023-12-10: 1000 ug via INTRAMUSCULAR
  Filled 2023-12-10: qty 1

## 2023-12-10 MED ORDER — ZOLEDRONIC ACID 4 MG/100ML IV SOLN: 4.0000 mg | Freq: Once | INTRAVENOUS | Status: DC

## 2023-12-10 NOTE — Assessment & Plan Note (Signed)
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 finished 5 years,  Continue annual mammogram

## 2023-12-10 NOTE — Assessment & Plan Note (Signed)
Recommend B12 IM injection monthly

## 2023-12-10 NOTE — Assessment & Plan Note (Signed)
 Labs are reviewed and discussed with patient. Lab Results  Component Value Date   HGB 11.5 (L) 12/09/2023   TIBC 281 12/09/2023   IRONPCTSAT 18 12/09/2023   FERRITIN 173 12/09/2023   She tolerated IV Venofer  treatments Hemoglobin has improved. Hold off Venofer  Suspect GI bleeding. Recommend patient to re -establish care with GI

## 2023-12-10 NOTE — Assessment & Plan Note (Signed)
#  Osteoporosis. Recommend calcium and vitamin D supplementation.   Proceed with Zometa 4 mg every 6 months.

## 2023-12-10 NOTE — Assessment & Plan Note (Addendum)
 K is critically low at 2.8. She is not complaint with potassium 20meq BID. On Lasix  PRN. Recommend patient to resume.  IV KCL 10meq today.  Communicated with her PCP who will follow up with her next week for potassium management.

## 2023-12-10 NOTE — Progress Notes (Signed)
 Hematology/Oncology Progress note [phone] [phone]    Chief Complaint: Ashley Ryan is a 78 y.o. female presents for follow up of left breast DCIS s/p mastectomy and B12 deficiency.  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 finished 5 years,  Continue annual mammogram  Iron  deficiency anemia Labs are reviewed and discussed with patient. Lab Results  Component Value Date   HGB 11.5 (L) 12/09/2023   TIBC 281 12/09/2023   IRONPCTSAT 18 12/09/2023   FERRITIN 173 12/09/2023   She tolerated IV Venofer  treatments Hemoglobin has improved. Hold off Venofer  Suspect GI bleeding. Recommend patient to re -establish care with GI     B12 deficiency Recommend B12 IM injection monthly   Osteoporosis #Osteoporosis. Recommend calcium  and vitamin D  supplementation.   Proceed with Zometa  4 mg every 6 months.    Hypokalemia K is critically low at 2.8. She is not complaint with potassium 20meq BID. On Lasix  PRN. Recommend patient to resume.  IV KCL 10meq today.  Communicated with her PCP who will follow up with her next week for potassium management.    Orders Placed This Encounter  Procedures   CBC with Differential (Cancer Center Only)    Standing Status:   Future    Expected Date:   06/08/2024    Expiration Date:   09/06/2024   CMP (Cancer Center only)    Standing Status:   Future    Expected Date:   06/08/2024    Expiration Date:   09/06/2024   Iron  and TIBC    Standing Status:   Future    Expected Date:   06/08/2024    Expiration Date:   09/06/2024   Ferritin    Standing Status:   Future    Expected Date:   06/08/2024    Expiration Date:   09/06/2024   Vitamin B12    Standing Status:   Future    Expected Date:   06/08/2024    Expiration Date:   09/06/2024   Follow up per LOS  All questions were answered. The patient knows to call the  clinic with any problems, questions or concerns.  Zelphia Cap, MD, PhD [facility] Hematology Oncology 12/10/2023   PERTINENT ONCOLOGY HISTORY Ashley Ryan is a 78 y.o.afemale who has above oncology history reviewed by me today presented for follow up visit for management of  left breast DCIS s/p mastectomy and B12 deficiency.  Patient previously followed up by Dr.Corcoran, patient switched care to me on 09/07/20 Extensive medical record review was performed by me  06/13/2017.  left breast DCIS s/p simple mastectomy on  Pathology revealed at least 8 mm of grade III DCIS.  Margins were negative.  One sentinel lymph node was negative.  DCIS was ER was + (> 90%) and PR + (75%).  Pathologic stage was Tis N0.   07/03/2017. tamoxifen    # Bone health.  Osteopenia 08/04/2019 Bone density revealed osteopenia with a T score of -2.1 in the right femoral neck.    # iron  deficiency anemia.  history of blood transfusion  EGD on 09/09/2013 revealed Cameron erosions, esophageal stricture s/p Maloney dilatation, and a large sliding hiatal hernia. EGD on 06/04/2019 revealed a medium-sized hiatal hernia, gastritis, and one duodenal polyp (peptic duodenitis).  Pathology revealed iron  pill gastritis and no H pylori, metaplasia, dysplasia or malignancy.  Colonoscopy on 09/09/2013 revealed an 8  mm sessile polyp in the cecum (tubular adenoma).  Colonoscopy on 06/04/2019 revealed two 6 to 8 mm polyps in the transverse colon (tubular adenomas), and one 6 mm polyp in the rectum (tubular adenoma).  Clip (MR conditional) was placed.  There was one 3 mm polyp in the ascending colon (tubular adenoma).  There were non-bleeding internal hemorrhoids and diverticulosis in the sigmoid colon.   09/07/2021 unilateral right screening mammogram showed possible asymmetry.  Asymmetry did not persist on additional views   INTERVAL HISTORY Ashley Ryan is a 78 y.o. female who has above history reviewed by me today presents for  follow up visit for management of history of DCIS, osteoporosis.  11/05/2023 She had EGD and colonoscopy EGD showed large paraesophageal hernia with cameron erosions, antrum erythematous mucosa. Colonoscopy showed diverticulosis, external internal hemorrhoids.   She has no new concerns.  No breast concerns.    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  Mastectomy/ SLN.   Chest pain    a. 10/2017 Cath: Nl cors.   Chronic headache    Chronic heart failure with preserved ejection fraction (HFpEF) (HCC)    a. 10/2021 Echo: EF 60-65%, no rwma, mild LVH, GrI DD, nl RV size/fxn, mild MR.   Complication of anesthesia    prior to 1991 used to have PONV.  none recently.   Diverticulosis of colon    Family history of adverse reaction to anesthesia    mother/daighter get sick   GERD with stricture    Hard of hearing    Hiatal hernia    Hypokalemia    Labile hypertension    a. 10/2017 Cath: nl renal arteries.   Mitral regurgitation    a. 10/2021 Echo: Mild MR.   Mixed hyperlipidemia    Neuropathy    feet   Osteoporosis    Pre-diabetes    Pulmonary hypertension (HCC)    a. 10/2017 Echo: PASP 35-12mmHg.   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   CATARACT EXTRACTION W/PHACO Left 02/21/2016   Procedure: CATARACT EXTRACTION PHACO AND INTRAOCULAR LENS PLACEMENT (IOC)  Left eye;  Surgeon: Dene Etienne, MD;  Location: [facility];  Service: Ophthalmology;  Laterality: Left;  Left eye Diabetic   CATARACT EXTRACTION W/PHACO Right 03/13/2016   Procedure: CATARACT EXTRACTION PHACO AND  INTRAOCULAR LENS PLACEMENT (IOC)  right  diabetic;  Surgeon: Dene Etienne, MD;  Location: [facility];  Service: Ophthalmology;  Laterality: Right;  diabetic   CHOLECYSTECTOMY  1978   COLONOSCOPY N/A 11/05/2023   Procedure: COLONOSCOPY;  Surgeon: Unk Corinn Skiff, MD;  Location: [facility] ENDOSCOPY;  Service: Gastroenterology;  Laterality: N/A;   COLONOSCOPY W/ POLYPECTOMY  09/09/2013   8 mm tubular adenoma of the cecum.  Lamar Aho, MD.  clinic   COLONOSCOPY WITH PROPOFOL  N/A 06/04/2019   Procedure: COLONOSCOPY WITH PROPOFOL ;  Surgeon: Jinny Carmine, MD;  Location: [facility] ENDOSCOPY;  Service: Endoscopy;  Laterality: N/A;   ESOPHAGOGASTRODUODENOSCOPY N/A 11/05/2023   Procedure: EGD (ESOPHAGOGASTRODUODENOSCOPY);  Surgeon: Unk Corinn Skiff, MD;  Location: [facility]  ENDOSCOPY;  Service: Gastroenterology;  Laterality: N/A;   ESOPHAGOGASTRODUODENOSCOPY (EGD) WITH PROPOFOL  N/A 06/04/2019   Procedure: ESOPHAGOGASTRODUODENOSCOPY (EGD) WITH PROPOFOL ;  Surgeon: Jinny Carmine, MD;  Location: [facility] ENDOSCOPY;  Service: Endoscopy;  Laterality: N/A;   JOINT REPLACEMENT Left    knee    LEFT HEART CATH AND CORONARY ANGIOGRAPHY N/A 11/06/2017   Procedure: LEFT HEART CATH AND CORONARY ANGIOGRAPHY;  Surgeon: Mady Bruckner, MD;  Location: [facility] INVASIVE CV LAB;  Service: Cardiovascular;  Laterality: N/A;   MASTECTOMY Left 06/13/2017   mastectomy neg margins   OOPHORECTOMY     one still left   RENAL ANGIOGRAPHY  11/06/2017   Procedure: RENAL ANGIOGRAPHY;  Surgeon: Mady Bruckner, MD;  Location: [facility] INVASIVE CV LAB;  Service: Cardiovascular;;   SENTINEL NODE BIOPSY Left 06/13/2017   Procedure: SENTINEL NODE BIOPSY;  Surgeon: Dessa Reyes ORN, MD;  Location: [facility] ORS;  Service: General;  Laterality: Left;   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  Surgeon: Dessa Reyes ORN, MD;  Location: [facility] ORS;  Service: General;   Laterality: Left;    Family History  Problem Relation Age of Onset   Breast cancer Other    Stomach cancer Other    Diabetes Other    Heart disease Other    Breast cancer Sister 72   Esophageal cancer Brother    Hypertension Mother    Heart attack Mother 16   Hypertension Father    Coronary artery disease Father 65       CABG   Stroke Father     Social History:  reports that she has never smoked. She has never used smokeless tobacco. She reports that she does not drink alcohol and does not use drugs. Her husband's name is Freight Forwarder.  She is a retired LAWYER.  She lives in [place]. The patient is alone today.   Allergies:  Allergies  Allergen Reactions   Oxycodone  Nausea Only   Sulfa Antibiotics Rash    Childhood reaction   Sulfonamide Derivatives Rash    Childhood reaction.    Current Medications: Current Outpatient Medications  Medication Sig Dispense Refill   Alcohol Swabs (DROPSAFE ALCOHOL PREP) 70 % PADS USE  TO TEST BLOOD SUGAR ONE TIME DAILY 100 each 3   amLODipine  (NORVASC ) 5 MG tablet TAKE 1 TABLET (5 MG TOTAL) BY MOUTH DAILY. 90 tablet 3   atorvastatin  (LIPITOR) 40 MG tablet TAKE 1 TABLET AT BEDTIME 90 tablet 3   Blood Glucose Calibration (ACCU-CHEK AVIVA) SOLN Use to check blood sugar once daily 1 each 0   Blood Glucose Calibration (TRUE METRIX LEVEL 1) Low SOLN USE AS DIRECTED 1 each 3   Blood Glucose Monitoring Suppl (TRUE METRIX AIR GLUCOSE METER) w/Device KIT Use to check blood sugar daily 1 kit 0   brimonidine  (ALPHAGAN ) 0.2 % ophthalmic solution brimonidine  0.2 % eye drops  INSTILL 1 DROP INTO BOTH EYES TWICE A DAY     busPIRone (BUSPAR) 5 MG tablet TAKE 1 TABLET BY MOUTH TWICE A DAY 180 tablet 1   Calcium  Carb-Cholecalciferol  (CALCIUM  600 + D) 600-5 MG-MCG TABS Take 2 tablets by mouth daily. 180 tablet 2   diclofenac  (VOLTAREN ) 75 MG EC tablet TAKE 1 TABLET BY MOUTH TWICE A DAY 60 tablet 1   diclofenac  Sodium (VOLTAREN ) 1 % GEL APPLY 2 GRAMS TOPICALLY TO THE  AFFECTED AREA 4 TIMES DAILY. 700 g 11   esomeprazole  (NEXIUM ) 40 MG capsule  TAKE 1 CAPSULE TWICE DAILY BEFORE MEALS 180 capsule 3   fluticasone  (FLONASE ) 50 MCG/ACT nasal spray USE 2 SPRAYS INTO THE NOSE DAILY AS DIRECTED (SUBSTITUTED FOR FLONASE ) 48 g 3   furosemide  (LASIX ) 20 MG tablet TAKE 1 TABLET BY MOUTH EVERY DAY AS NEEDED 30 tablet 5   gabapentin  (NEURONTIN ) 100 MG capsule TAKE 1 CAPSULE EVERY MORNING , 1 CAPSULE AT LUNCH, 1 CAPSULE AT DINNER, AND 2-3 CAPSULES AT BEDTIME 540 capsule 1   Glucosamine 500 MG TABS Take 500 mg by mouth daily.     glucose blood (TRUE METRIX BLOOD GLUCOSE TEST) test strip Use to check blood sugar daily 100 each 3   Iron -Vitamin C  65-125 MG TABS Take 1 tablet by mouth daily at 2 PM. 30 tablet 5   isosorbide  mononitrate (IMDUR ) 30 MG 24 hr tablet TAKE 1 TABLET EVERY DAY 90 tablet 3   latanoprost  (XALATAN ) 0.005 % ophthalmic solution Place 1 drop into both eyes at bedtime.     lidocaine  (LIDODERM ) 5 % APPLY 1 PATCH ONTO THE SKIN EVERY DAY. REMOVE AND DISCARD PATCH WITHIN 12 HOURS OR AS DIRECTED BY PHYSICIAN 90 patch 1   losartan -hydrochlorothiazide  (HYZAAR) 100-25 MG tablet Take 1 tablet by mouth daily.     mirabegron  ER (MYRBETRIQ ) 25 MG TB24 tablet Take 1 tablet (25 mg total) by mouth daily. 30 tablet 11   Omega-3 Fatty Acids (FISH OIL) 1000 MG CAPS Take 1,000 mg by mouth daily.      polycarbophil (FIBERCON) 625 MG tablet Take 625 mg by mouth daily.     potassium chloride  SA (KLOR-CON  M) 20 MEQ tablet TAKE 1 TABLET BY MOUTH TWICE A DAY 180 tablet 2   traMADol  (ULTRAM ) 50 MG tablet Take 1 tablet (50 mg total) by mouth every 6 (six) hours as needed. for pain 90 tablet 0   triamcinolone  cream (KENALOG ) 0.5 % Apply 1 application topically 2 (two) times daily. 30 g 0   TRUEplus Lancets 28G MISC TEST BLOOD SUGAR EVERY DAY 100 each 3   No current facility-administered medications for this visit.   Facility-Administered Medications Ordered in Other Visits  Medication  Dose Route Frequency Provider Last Rate Last Admin   cyanocobalamin  (VITAMIN B12) injection 1,000 mcg  1,000 mcg Intramuscular Once Babara Call, MD       Review of Systems  Constitutional:  Negative for appetite change, chills, fatigue and fever.  HENT:   Negative for hearing loss and voice change.   Eyes:  Negative for eye problems.  Respiratory:  Negative for chest tightness and cough.   Cardiovascular:  Negative for chest pain.  Gastrointestinal:  Negative for abdominal distention, abdominal pain and blood in stool.  Endocrine: Negative for hot flashes.  Genitourinary:  Negative for difficulty urinating and frequency.   Musculoskeletal:  Positive for arthralgias and back pain.  Skin:  Negative for itching and rash.  Neurological:  Negative for extremity weakness.  Hematological:  Negative for adenopathy.  Psychiatric/Behavioral:  Negative for confusion.      Performance status (ECOG): 1  Vital Signs Blood pressure (!) 140/79, pulse 78, temperature (!) 97.2 F (36.2 C), temperature source Tympanic, resp. rate 18, SpO2 97%.  Physical Exam Constitutional:      General: She is not in acute distress.    Appearance: She is not diaphoretic.     Comments: Thin, ambulates independantly  HENT:     Head: Normocephalic and atraumatic.  Eyes:     General: No scleral icterus. Cardiovascular:  Rate and Rhythm: Normal rate.  Pulmonary:     Effort: Pulmonary effort is normal. No respiratory distress.  Chest:     Chest wall: No tenderness.  Abdominal:     General: There is no distension.     Palpations: Abdomen is soft.     Tenderness: There is no abdominal tenderness.  Musculoskeletal:        General: Normal range of motion.     Cervical back: Normal range of motion and neck supple.  Skin:    General: Skin is warm.     Findings: No erythema.  Neurological:     Mental Status: She is alert and oriented to person, place, and time.     Cranial Nerves: No cranial nerve deficit.      Motor: No abnormal muscle tone.  Psychiatric:        Mood and Affect: Mood and affect normal.     Laboratory studies.     Latest Ref Rng & Units 12/09/2023    1:21 PM 04/15/2023   12:56 PM 10/16/2022    2:37 PM  CBC  WBC 4.0 - 10.5 K/uL 7.8  6.3  7.2   Hemoglobin 12.0 - 15.0 g/dL 88.4  88.6  89.5   Hematocrit 36.0 - 46.0 % 36.6  36.0  34.5   Platelets 150 - 400 K/uL 195  185  151        Latest Ref Rng & Units 12/09/2023    1:21 PM 04/15/2023   12:57 PM 02/18/2023    8:26 AM  CMP  Glucose 70 - 99 mg/dL 861  876  79   BUN 8 - 23 mg/dL 13  19  11    Creatinine 0.44 - 1.00 mg/dL 9.33  9.16  9.43   Sodium 135 - 145 mmol/L 139  138  140   Potassium 3.5 - 5.1 mmol/L 2.8  3.5  4.1   Chloride 98 - 111 mmol/L 104  106  105   CO2 22 - 32 mmol/L 24  23  30    Calcium  8.9 - 10.3 mg/dL 9.0  9.1  9.1   Total Protein 6.5 - 8.1 g/dL 5.9   6.1   Total Bilirubin 0.0 - 1.2 mg/dL 0.8   0.4   Alkaline Phos 38 - 126 U/L 67   57   AST 15 - 41 U/L 17   14   ALT 0 - 44 U/L 12   12

## 2023-12-22 ENCOUNTER — Other Ambulatory Visit

## 2024-01-06 ENCOUNTER — Other Ambulatory Visit: Payer: Self-pay | Admitting: Family Medicine

## 2024-01-06 NOTE — Telephone Encounter (Signed)
 Last office 10/30/23 for Anxiety.  Last refilled 09/16/2023 for #90 with no refills.  Next Appt: CPE 03/19/24.

## 2024-01-09 ENCOUNTER — Inpatient Hospital Stay: Attending: Oncology

## 2024-01-21 ENCOUNTER — Other Ambulatory Visit: Payer: Self-pay | Admitting: Family Medicine

## 2024-01-23 ENCOUNTER — Other Ambulatory Visit: Payer: Self-pay | Admitting: Family Medicine

## 2024-01-23 MED ORDER — LIDOCAINE 5 % EX PTCH: MEDICATED_PATCH | CUTANEOUS | 0 refills | Status: AC

## 2024-01-23 MED ORDER — ISOSORBIDE MONONITRATE ER 30 MG PO TB24: 30.0000 mg | ORAL_TABLET | Freq: Every day | ORAL | 0 refills | Status: AC

## 2024-01-23 MED ORDER — ESOMEPRAZOLE MAGNESIUM 40 MG PO CPDR: DELAYED_RELEASE_CAPSULE | ORAL | 0 refills | Status: AC

## 2024-01-23 MED ORDER — FLUTICASONE PROPIONATE 50 MCG/ACT NA SUSP: NASAL | 0 refills | Status: AC

## 2024-01-23 MED ORDER — BUSPIRONE HCL 5 MG PO TABS: 5.0000 mg | ORAL_TABLET | Freq: Two times a day (BID) | ORAL | 0 refills | Status: AC

## 2024-01-23 MED ORDER — ATORVASTATIN CALCIUM 40 MG PO TABS: 40.0000 mg | ORAL_TABLET | Freq: Every day | ORAL | 0 refills | Status: AC

## 2024-01-23 MED ORDER — DICLOFENAC SODIUM 75 MG PO TBEC: 75.0000 mg | DELAYED_RELEASE_TABLET | Freq: Two times a day (BID) | ORAL | 0 refills | Status: AC

## 2024-01-23 MED ORDER — MIRABEGRON ER 25 MG PO TB24: 25.0000 mg | ORAL_TABLET | Freq: Every day | ORAL | 0 refills | Status: AC

## 2024-01-27 ENCOUNTER — Encounter: Payer: Self-pay | Admitting: Urgent Care

## 2024-01-27 NOTE — Progress Notes (Signed)
 "    Pretty Weltman T. Conard Alvira, MD, CAQ Sports Medicine [facility] [address] [place], [address]  Phone: [phone]  FAX: [phone]  Ashley Ryan - 79 y.o. female  MRN [id number]  Date of Birth: [birth year]  Date: 01/28/2024  PCP: Avelina Greig BRAVO, MD  Referral: Avelina Greig BRAVO, MD  Chief Complaint  Patient presents with   Knee Pain    Right   Shoulder Pain    Left   Subjective:   Ashley Ryan is a 79 y.o. very pleasant female patient with Body mass index is 23.43 kg/m. who presents with the following:  Discussed the use of AI scribe software for clinical note transcription with the patient, who gave verbal consent to proceed.  Patient presents with ongoing knee pain and follow-up.  She has severe arthritis of the right knee.  She also has some left-sided shoulder pain.  She has known very well for many years.  Weightbearing right knee films have persistently showed end-stage lateral compartmental osteoarthritis.  She also has had some persistent left-sided shoulder pain.  Prior x-rays were reviewed from May 08, 2023.  There is some patchy sclerosis at the superomedial left humeral head.  No collapse.  There is also a mild lucency. - Shoulder had been doing better, but she now is having and complaining of some more persistent left-sided shoulder pain. - She is currently having pain with abduction, flexion and rotational maneuvers  Review of Systems is noted in the HPI, as appropriate  Patient Active Problem List   Diagnosis Date Noted   Situational anxiety 10/30/2023   Vaginal prolapse 03/19/2023   Mild mitral regurgitation 02/28/2022   OAB (overactive bladder) 02/19/2022   Compression fracture of T10 vertebra (HCC) 08/17/2021   Polyp of transverse colon    Rectal polyp    Acute gastritis without hemorrhage    (HFpEF) heart failure with preserved ejection fraction (HCC) 01/01/2019   Pulmonary hypertension (HCC) 01/01/2019    CAD (coronary artery disease), nonobstructive 01/01/2019   MDD (major depressive disorder), mild (HCC) 12/31/2018   Chronic fatigue 10/15/2018   Nonrheumatic aortic valve insufficiency 10/15/2018   Hypokalemia 09/07/2018   Hypertension associated with diabetes (HCC) 11/05/2017   B12 deficiency 07/24/2017   Osteopenia of spine 07/03/2017   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   Great toe pain, left 02/11/2017   Bilateral foot pain 05/21/2016   Counseling regarding end of life decision making 11/10/2014   Closed fracture of fourth metatarsal of right foot 11/01/2014   Cameron ulcer 10/07/2013   GERD (gastroesophageal reflux disease) 10/07/2013   Stricture and stenosis of esophagus 10/07/2013   Iron  deficiency anemia 09/08/2013   History of colonic polyps 08/28/2013   Osteoporosis 04/30/2012   Diabetic peripheral neuropathy (HCC) 12/27/2009   Osteoarthritis, bilateral knees 11/23/2009   Controlled type 2 diabetes with neuropathy (HCC) 02/03/2009   Hyperlipidemia LDL goal <70 02/03/2009   Migraine without aura 11/03/2008   ALLERGIC RHINITIS DUE TO POLLEN 11/03/2008   Diaphragmatic hernia 11/03/2008   Diverticulosis of colon 11/03/2008   GASTRIC ULCER, HX OF 11/03/2008    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  Mastectomy/ SLN.   Chest pain    a. 10/2017 Cath: Nl cors.   Chronic headache    Chronic heart failure with preserved ejection fraction (  HFpEF) (HCC)    a. 10/2021 Echo: EF 60-65%, no rwma, mild LVH, GrI DD, nl RV size/fxn, mild MR.   Complication of anesthesia    prior to 1991 used to have PONV.  none recently.   Diverticulosis of colon    Family history of adverse reaction to anesthesia    mother/daighter get sick   GERD with stricture    Hard of hearing    Hiatal hernia    Hypokalemia    Labile hypertension    a. 10/2017 Cath: nl renal  arteries.   Mitral regurgitation    a. 10/2021 Echo: Mild MR.   Mixed hyperlipidemia    Neuropathy    feet   Osteoporosis    Pre-diabetes    Pulmonary hypertension (HCC)    a. 10/2017 Echo: PASP 35-98mmHg.   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   CATARACT EXTRACTION W/PHACO Left 02/21/2016   Procedure: CATARACT EXTRACTION PHACO AND INTRAOCULAR LENS PLACEMENT (IOC)  Left eye;  Surgeon: Dene Etienne, MD;  Location: [facility];  Service: Ophthalmology;  Laterality: Left;  Left eye Diabetic   CATARACT EXTRACTION W/PHACO Right 03/13/2016   Procedure: CATARACT EXTRACTION PHACO AND INTRAOCULAR LENS PLACEMENT (IOC)  right  diabetic;  Surgeon: Dene Etienne, MD;  Location: [facility];  Service: Ophthalmology;  Laterality: Right;  diabetic   CHOLECYSTECTOMY  1978   COLONOSCOPY N/A 11/05/2023   Procedure: COLONOSCOPY;  Surgeon: Unk Corinn Skiff, MD;  Location: [facility] END[facility];  Service: Gastroenterology;  Laterality: N/A;   COLONOSCOPY W/ POLYPECTOMY  09/09/2013   8 mm tubular adenoma of the cecum.  Lamar Aho, MD. [facility]   COLONOSCOPY WITH PROPOFOL  N/A 06/04/2019   Procedure: COLONOSCOPY WITH PROPOFOL ;  Surgeon: Jinny Carmine, MD;  Location: [facility];  Service: Endoscopy;  Laterality: N/A;   ESOPHAGOGASTRODUODENOSCOPY N/A 11/05/2023   Procedure: EGD (ESOPHAGOGASTRODUODENOSCOPY);  Surgeon: Unk Corinn Skiff, MD;  Location: [facility] END[facility]COPY;  Service: Gastroenterology;  Laterality: N/A;   ESOPHAGOGASTRODUODENOSCOPY (EGD) WITH PROPOFOL  N/A 06/04/2019   Procedure: ESOPHAGOGASTRODUODENOSCOPY (EGD) WITH PROPOFOL ;  Surgeon: Jinny Carmine, MD;  Location: [facility] ENDOSCOPY;   Service: Endoscopy;  Laterality: N/A;   JOINT REPLACEMENT Left    knee    LEFT HEART CATH AND CORONARY ANGIOGRAPHY N/A 11/06/2017   Procedure: LEFT HEART CATH AND CORONARY ANGIOGRAPHY;  Surgeon: Mady Bruckner, MD;  Location: [facility] INVASIVE CV LAB;  Service: Cardiovascular;  Laterality: N/A;   MASTECTOMY Left 06/13/2017   mastectomy neg margins   OOPHORECTOMY     one still left   RENAL ANGIOGRAPHY  11/06/2017   Procedure: RENAL ANGIOGRAPHY;  Surgeon: Mady Bruckner, MD;  Location: [facility];  Service: Cardiovascular;;   SENTINEL NODE BIOPSY Left 06/13/2017   Procedure: SENTINEL NODE BIOPSY;  Surgeon: Dessa Reyes ORN, MD;  Location: [facility] ORS;  Service: General;  Laterality: Left;   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  Surgeon: Dessa Reyes ORN, MD;  Location: [facility] ORS;  Service: General;  Laterality: Left;    Family History  Problem Relation Age of Onset   Breast cancer Other  Stomach cancer Other    Diabetes Other    Heart disease Other    Breast cancer Sister 68   Esophageal cancer Brother    Hypertension Mother    Heart attack Mother 18   Hypertension Father    Coronary artery disease Father 20       CABG   Stroke Father     Social History   Social History Narrative   Drinks sweet tea, diet sodas 4 a day   No living will, full code (reviewed 2014)     Objective:   BP (!) 154/78   Pulse 75   Temp 97.7 F (36.5 C) (Temporal)   Ht 4' 11 (1.499 m)   Wt 116 lb (52.6 kg)   SpO2 99%   BMI 23.43 kg/m   GEN: No acute distress; alert,appropriate. PULM: Breathing comfortably in no respiratory distress PSYCH: Normally interactive.   Right knee: Lacks 4 degrees of extension and flexion to 98 degrees medial greater than lateral joint line tenderness Mild effusion ACL, PCL, MCL, and LCL are all intact   Shoulder: L Inspection: No muscle wasting or  winging Ecchymosis/edema: neg  AC joint, scapula, clavicle: Mildly tender to palpation at the [facility] joint, mildly tender in the bicipital groove Cervical spine: NT, full ROM Spurling's: neg Abduction: full, 4+ /5, abduction is limited to 165 Flexion: full, 4+ /5 IR, full, lift-off: 5/5 ER at neutral: full, 4+ /5 AC crossover and compression: Positive Neer: Positive Hawkins: Positive Drop Test: neg Jobe: Positive Supraspinatus insertion: NT Bicipital groove: NT Speed's: neg Yergason's: neg Sulcus sign: neg Scapular dyskinesis: none C5-T1 intact Sensation intact Grip 5/5   Laboratory and Imaging Data:  Assessment and Plan:     ICD-10-CM   1. Primary osteoarthritis of right knee  M17.11 triamcinolone  acetonide (KENALOG -40) injection 40 mg    2. Chronic left shoulder pain  M25.512 MR Shoulder Left Wo Contrast   G89.29     3. 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  Will do a repeat corticosteroid injection today.  Persistent left-sided shoulder pain with worsening pain and pain with abduction and flexion and mild weakness.  Prior patchy sclerotic changes and mild lucency in the humeral head could coincide with avascular necrosis, but not specific. - Obtain an MRI of the left shoulder to better evaluate the cortical and sclerotic changes at the humeral head on the left shoulder.  Aspiration/Injection Procedure Note SHAIVI ROTHSCHILD [birth year] Date of procedure: 01/28/2024  Procedure: Large Joint Aspiration / Injection of Knee, R Indications: Pain  Procedure Details Patient verbally consented to procedure. Risks, benefits, and alternatives explained. Sterilely prepped with Chloraprep. Ethyl cholride used for anesthesia. 9 cc Lidocaine  1% mixed with 1 mL of Kenalog  40 mg injected using the  anteromedial approach without difficulty. No complications with procedure and tolerated well. Patient had decreased pain post-injection. Medication: 1 mL of Kenalog  40 mg   Medication Management during today's office visit: Meds ordered this encounter  Medications   triamcinolone  acetonide (KENALOG -40) injection 40 mg   There are no discontinued medications.  Orders placed today for conditions managed today: Orders Placed This Encounter  Procedures   MR Shoulder Left Wo Contrast    Disposition: No follow-ups on file.  [employer] One speech-to-text software was used for transcription in this dictation.  Possible transcriptional errors can occur using Animal nutritionist.   Signed,  Jacques DASEN. Jaxin Fulfer, MD   Outpatient Encounter Medications as of 01/28/2024  Medication Sig   Alcohol Swabs (DROPSAFE ALCOHOL PREP) 70 % PADS USE  TO TEST BLOOD SUGAR ONE TIME DAILY   amLODipine  (NORVASC ) 5 MG tablet Take 1 tablet (5 mg total) by mouth daily.   atorvastatin  (LIPITOR) 40 MG tablet Take 1 tablet (40 mg total) by mouth at bedtime.   Blood Glucose Calibration (ACCU-CHEK AVIVA) SOLN Use to check blood sugar once daily   Blood Glucose Calibration (TRUE METRIX LEVEL 1) Low SOLN USE AS DIRECTED   Blood Glucose Monitoring Suppl (TRUE METRIX AIR GLUCOSE METER) w/Device KIT Use to check blood sugar daily   brimonidine  (ALPHAGAN ) 0.2 % ophthalmic solution brimonidine  0.2 % eye drops  INSTILL 1 DROP INTO BOTH EYES TWICE A DAY   busPIRone  (BUSPAR ) 5 MG tablet Take 1 tablet (5 mg total) by mouth 2 (two) times daily.   Calcium  Carb-Cholecalciferol  (CALCIUM  600 + D) 600-5 MG-MCG TABS Take 2 tablets by mouth daily.   diclofenac  (VOLTAREN ) 75 MG EC tablet Take 1 tablet (75 mg total) by mouth 2 (two) times daily.   diclofenac  Sodium (VOLTAREN ) 1 % GEL APPLY 2 GRAMS TOPICALLY TO THE AFFECTED AREA 4 TIMES DAILY.   esomeprazole  (NEXIUM ) 40 MG capsule TAKE 1 CAPSULE TWICE DAILY BEFORE MEALS   fluticasone   (FLONASE ) 50 MCG/ACT nasal spray USE 2 SPRAYS INTO THE NOSE DAILY AS DIRECTED (SUBSTITUTED FOR FLONASE )   furosemide  (LASIX ) 20 MG tablet TAKE 1 TABLET BY MOUTH EVERY DAY AS NEEDED   gabapentin  (NEURONTIN ) 100 MG capsule TAKE 1 CAPSULE EVERY MORNING , 1 CAPSULE AT LUNCH, 1 CAPSULE AT DINNER, AND 2-3 CAPSULES AT BEDTIME   Glucosamine 500 MG TABS Take 500 mg by mouth daily.   glucose blood (TRUE METRIX BLOOD GLUCOSE TEST) test strip Use to check blood sugar daily   Iron -Vitamin C  65-125 MG TABS Take 1 tablet by mouth daily at 2 PM.   isosorbide  mononitrate (IMDUR ) 30 MG 24 hr tablet Take 1 tablet (30 mg total) by mouth daily.   latanoprost  (XALATAN ) 0.005 % ophthalmic solution Place 1 drop into both eyes at bedtime.   lidocaine  (LIDODERM ) 5 % APPLY 1 PATCH ONTO THE SKIN EVERY DAY. REMOVE AND DISCARD PATCH WITHIN 12 HOURS OR AS DIRECTED BY PHYSICIAN.   losartan -hydrochlorothiazide  (HYZAAR) 100-25 MG tablet Take 1 tablet by mouth daily.   mirabegron  ER (MYRBETRIQ ) 25 MG TB24 tablet Take 1 tablet (25 mg total) by mouth daily.   Omega-3 Fatty Acids (FISH OIL) 1000 MG CAPS Take 1,000 mg by mouth daily.    polycarbophil (FIBERCON) 625 MG tablet Take 625 mg by mouth daily.   potassium chloride  SA (KLOR-CON  M) 20 MEQ tablet Take 1 tablet (20 mEq total) by mouth 2 (two) times daily.   traMADol  (ULTRAM ) 50 MG tablet Take 1 tablet (50 mg total) by mouth every 6 (six) hours as needed. for pain   triamcinolone  cream (KENALOG ) 0.5 % Apply 1 application topically 2 (two) times daily.   TRUEplus Lancets 28G MISC TEST BLOOD SUGAR EVERY DAY   [DISCONTINUED] amLODipine  (NORVASC ) 5 MG tablet TAKE 1 TABLET (5 MG TOTAL) BY MOUTH DAILY.   [DISCONTINUED] gabapentin  (NEURONTIN ) 100 MG capsule TAKE 1 CAPSULE EVERY MORNING , 1 CAPSULE AT LUNCH, 1 CAPSULE AT DINNER, AND 2-3 CAPSULES AT BEDTIME   [DISCONTINUED] potassium chloride  SA (KLOR-CON  M) 20 MEQ tablet TAKE 1 TABLET BY MOUTH  TWICE A DAY   [DISCONTINUED] traMADol   (ULTRAM ) 50 MG tablet TAKE 1 TABLET BY MOUTH EVERY 6 HOURS AS NEEDED. FOR PAIN   Facility-Administered Encounter Medications as of 01/28/2024  Medication   cyanocobalamin  (VITAMIN B12) injection 1,000 mcg   [COMPLETED] triamcinolone  acetonide (KENALOG -40) injection 40 mg   "

## 2024-01-28 ENCOUNTER — Other Ambulatory Visit (HOSPITAL_COMMUNITY): Payer: Self-pay

## 2024-01-28 ENCOUNTER — Other Ambulatory Visit: Payer: Self-pay | Admitting: Family Medicine

## 2024-01-28 ENCOUNTER — Encounter: Payer: Self-pay | Admitting: Urgent Care

## 2024-01-28 ENCOUNTER — Encounter: Payer: Self-pay | Admitting: Family Medicine

## 2024-01-28 ENCOUNTER — Ambulatory Visit: Admitting: Family Medicine

## 2024-01-28 ENCOUNTER — Telehealth: Payer: Self-pay

## 2024-01-28 VITALS — BP 154/78 | HR 75 | Temp 97.7°F | Ht 59.0 in | Wt 116.0 lb

## 2024-01-28 DIAGNOSIS — M25512 Pain in left shoulder: Secondary | ICD-10-CM | POA: Diagnosis not present

## 2024-01-28 DIAGNOSIS — G8929 Other chronic pain: Secondary | ICD-10-CM | POA: Diagnosis not present

## 2024-01-28 DIAGNOSIS — M1711 Unilateral primary osteoarthritis, right knee: Secondary | ICD-10-CM

## 2024-01-28 DIAGNOSIS — M87019 Idiopathic aseptic necrosis of unspecified shoulder: Secondary | ICD-10-CM

## 2024-01-28 MED ORDER — POTASSIUM CHLORIDE CRYS ER 20 MEQ PO TBCR: 20.0000 meq | EXTENDED_RELEASE_TABLET | Freq: Two times a day (BID) | ORAL | 0 refills | Status: AC

## 2024-01-28 MED ORDER — TRAMADOL HCL 50 MG PO TABS: 50.0000 mg | ORAL_TABLET | Freq: Four times a day (QID) | ORAL | 0 refills | Status: AC | PRN

## 2024-01-28 MED ORDER — TRIAMCINOLONE ACETONIDE 40 MG/ML IJ SUSP
40.0000 mg | Freq: Once | INTRAMUSCULAR | Status: AC
  Administered 2024-01-28: 40 mg via INTRA_ARTICULAR

## 2024-01-28 MED ORDER — GABAPENTIN 100 MG PO CAPS: ORAL_CAPSULE | ORAL | 1 refills | Status: AC

## 2024-01-28 MED ORDER — AMLODIPINE BESYLATE 5 MG PO TABS: 5.0000 mg | ORAL_TABLET | Freq: Every day | ORAL | 0 refills | Status: AC

## 2024-01-28 NOTE — Telephone Encounter (Signed)
 Spoke with Ashley Ryan.  She is not on fluoxetine  anymore and we don't know what the sodium solution is So she just need the listed.  Patient is switching pharmacies.

## 2024-01-28 NOTE — Telephone Encounter (Signed)
 Copied from CRM #[id number]. Topic: Clinical - Medication Refill >> Jan 28, 2024  9:50 AM Nessti S wrote: Medication: NOT IN MED LIST-sodium ful-potassium sul-mag sul sol traMADol  (ULTRAM ) 50 MG tablet NOT IN MED LIST-Fluoxetine  HCL 10MG   amLODipine  (NORVASC ) 5 MG tablet potassium chloride  SA (KLOR-CON  M) 20 MEQ tablet gabapentin  (NEURONTIN ) 100 MG capsule  Has the patient contacted their pharmacy? Yes (Agent: If no, request that the patient contact the pharmacy for the refill. If patient does not wish to contact the pharmacy document the reason why and proceed with request.) (Agent: If yes, when and what did the pharmacy advise?)  This is the patient's preferred pharmacy: [facility] - [place], [place] - [address] [place] [address] Phone: [phone] Fax: [phone]  Is this the correct pharmacy for this prescription? Yes If no, delete pharmacy and type the correct one.   Has the prescription been filled recently? No  Is the patient out of the medication? Yes  Has the patient been seen for an appointment in the last year OR does the patient have an upcoming appointment? Yes  Can we respond through MyChart? No  Agent: Please be advised that Rx refills may take up to 3 business days. We ask that you follow-up with your pharmacy.

## 2024-01-28 NOTE — Telephone Encounter (Signed)
 Last office visit 10/27/2023 with Dr. Watt for Knee Pain.  Last refilled Gabapentin  12/01/2023 for #540 with 1 refill.  Tramadol  01/06/2024 for #90 with no refills.  Next appt: CPE 03/19/2024.

## 2024-01-28 NOTE — Telephone Encounter (Signed)
 Pharmacy Patient Advocate Encounter   Received notification from [facility] KEY that prior authorization for Lidocaine  5% patches is required/requested.   Insurance verification completed.   The patient is insured through [facility].   Per test claim: PA required; PA submitted to above mentioned insurance via Latent Key/confirmation #/EOC [id number] Status is pending

## 2024-01-29 ENCOUNTER — Encounter: Payer: Self-pay | Admitting: Family Medicine

## 2024-01-30 ENCOUNTER — Other Ambulatory Visit (HOSPITAL_COMMUNITY): Payer: Self-pay

## 2024-01-30 NOTE — Telephone Encounter (Signed)
 Pharmacy Patient Advocate Encounter  Received notification from [facility] that Prior Authorization for Lidocaine  5% patches has been APPROVED from 01/28/24 to 01/20/25. Unable to obtain price due to refill too soon rejection, last fill date 01/29/24 next available fill date1/31/26   PA #/Case ID/Reference #: # [id number]

## 2024-02-09 ENCOUNTER — Inpatient Hospital Stay: Attending: Oncology

## 2024-02-09 DIAGNOSIS — D509 Iron deficiency anemia, unspecified: Secondary | ICD-10-CM | POA: Diagnosis present

## 2024-02-09 DIAGNOSIS — M810 Age-related osteoporosis without current pathological fracture: Secondary | ICD-10-CM | POA: Diagnosis present

## 2024-02-09 DIAGNOSIS — E538 Deficiency of other specified B group vitamins: Secondary | ICD-10-CM | POA: Diagnosis present

## 2024-02-09 MED ORDER — CYANOCOBALAMIN 1000 MCG/ML IJ SOLN
1000.0000 ug | Freq: Once | INTRAMUSCULAR | Status: AC
  Administered 2024-02-09: 1000 ug via INTRAMUSCULAR
  Filled 2024-02-09: qty 1

## 2024-02-18 ENCOUNTER — Encounter: Payer: Self-pay | Admitting: Urgent Care

## 2024-02-18 ENCOUNTER — Inpatient Hospital Stay
Admission: EM | Admit: 2024-02-18 | Source: Home / Self Care | Attending: Pulmonary Disease | Admitting: Pulmonary Disease

## 2024-02-18 ENCOUNTER — Emergency Department

## 2024-02-18 ENCOUNTER — Other Ambulatory Visit: Payer: Self-pay

## 2024-02-18 ENCOUNTER — Ambulatory Visit: Payer: Self-pay

## 2024-02-18 DIAGNOSIS — E872 Acidosis, unspecified: Secondary | ICD-10-CM

## 2024-02-18 DIAGNOSIS — E1159 Type 2 diabetes mellitus with other circulatory complications: Secondary | ICD-10-CM | POA: Diagnosis present

## 2024-02-18 DIAGNOSIS — N179 Acute kidney failure, unspecified: Secondary | ICD-10-CM

## 2024-02-18 DIAGNOSIS — I2510 Atherosclerotic heart disease of native coronary artery without angina pectoris: Secondary | ICD-10-CM | POA: Diagnosis present

## 2024-02-18 DIAGNOSIS — I5030 Unspecified diastolic (congestive) heart failure: Secondary | ICD-10-CM | POA: Diagnosis present

## 2024-02-18 DIAGNOSIS — R112 Nausea with vomiting, unspecified: Secondary | ICD-10-CM

## 2024-02-18 DIAGNOSIS — K56609 Unspecified intestinal obstruction, unspecified as to partial versus complete obstruction: Secondary | ICD-10-CM | POA: Diagnosis not present

## 2024-02-18 DIAGNOSIS — E1142 Type 2 diabetes mellitus with diabetic polyneuropathy: Secondary | ICD-10-CM | POA: Diagnosis present

## 2024-02-18 DIAGNOSIS — E1140 Type 2 diabetes mellitus with diabetic neuropathy, unspecified: Secondary | ICD-10-CM | POA: Diagnosis present

## 2024-02-18 DIAGNOSIS — F330 Major depressive disorder, recurrent, mild: Secondary | ICD-10-CM | POA: Diagnosis present

## 2024-02-18 DIAGNOSIS — K750 Abscess of liver: Secondary | ICD-10-CM

## 2024-02-18 DIAGNOSIS — K55069 Acute infarction of intestine, part and extent unspecified: Secondary | ICD-10-CM | POA: Diagnosis not present

## 2024-02-18 DIAGNOSIS — A419 Sepsis, unspecified organism: Secondary | ICD-10-CM

## 2024-02-18 DIAGNOSIS — N3281 Overactive bladder: Secondary | ICD-10-CM | POA: Diagnosis present

## 2024-02-18 DIAGNOSIS — I152 Hypertension secondary to endocrine disorders: Secondary | ICD-10-CM | POA: Diagnosis present

## 2024-02-18 DIAGNOSIS — K449 Diaphragmatic hernia without obstruction or gangrene: Secondary | ICD-10-CM

## 2024-02-18 DIAGNOSIS — K219 Gastro-esophageal reflux disease without esophagitis: Secondary | ICD-10-CM | POA: Diagnosis present

## 2024-02-18 DIAGNOSIS — K469 Unspecified abdominal hernia without obstruction or gangrene: Secondary | ICD-10-CM | POA: Diagnosis not present

## 2024-02-18 DIAGNOSIS — R1084 Generalized abdominal pain: Principal | ICD-10-CM

## 2024-02-18 LAB — URINALYSIS, ROUTINE W REFLEX MICROSCOPIC
Bilirubin Urine: NEGATIVE
Glucose, UA: NEGATIVE mg/dL
Hgb urine dipstick: NEGATIVE
Ketones, ur: NEGATIVE mg/dL
Leukocytes,Ua: NEGATIVE
Nitrite: NEGATIVE
Protein, ur: 30 mg/dL — AB
Specific Gravity, Urine: 1.016 (ref 1.005–1.030)
pH: 5 (ref 5.0–8.0)

## 2024-02-18 LAB — CBC
HCT: 43.7 % (ref 36.0–46.0)
Hemoglobin: 13.5 g/dL (ref 12.0–15.0)
MCH: 26.5 pg (ref 26.0–34.0)
MCHC: 30.9 g/dL (ref 30.0–36.0)
MCV: 85.7 fL (ref 80.0–100.0)
Platelets: 235 10*3/uL (ref 150–400)
RBC: 5.1 MIL/uL (ref 3.87–5.11)
RDW: 18 % — ABNORMAL HIGH (ref 11.5–15.5)
WBC: 18.7 10*3/uL — ABNORMAL HIGH (ref 4.0–10.5)
nRBC: 0 % (ref 0.0–0.2)

## 2024-02-18 LAB — LIPASE, BLOOD: Lipase: 17 U/L (ref 11–51)

## 2024-02-18 LAB — COMPREHENSIVE METABOLIC PANEL WITH GFR
ALT: 64 U/L — ABNORMAL HIGH (ref 0–44)
AST: 64 U/L — ABNORMAL HIGH (ref 15–41)
Albumin: 4 g/dL (ref 3.5–5.0)
Alkaline Phosphatase: 80 U/L (ref 38–126)
Anion gap: 20 — ABNORMAL HIGH (ref 5–15)
BUN: 26 mg/dL — ABNORMAL HIGH (ref 8–23)
CO2: 23 mmol/L (ref 22–32)
Calcium: 10.1 mg/dL (ref 8.9–10.3)
Chloride: 99 mmol/L (ref 98–111)
Creatinine, Ser: 1.51 mg/dL — ABNORMAL HIGH (ref 0.44–1.00)
GFR, Estimated: 35 mL/min — ABNORMAL LOW
Glucose, Bld: 143 mg/dL — ABNORMAL HIGH (ref 70–99)
Potassium: 3.7 mmol/L (ref 3.5–5.1)
Sodium: 141 mmol/L (ref 135–145)
Total Bilirubin: 0.6 mg/dL (ref 0.0–1.2)
Total Protein: 6.3 g/dL — ABNORMAL LOW (ref 6.5–8.1)

## 2024-02-18 LAB — MAGNESIUM: Magnesium: 4 mg/dL — ABNORMAL HIGH (ref 1.7–2.4)

## 2024-02-18 LAB — LACTIC ACID, PLASMA
Lactic Acid, Venous: >9 mmol/L (ref 0.5–1.9)
Lactic Acid, Venous: 7.9 mmol/L (ref 0.5–1.9)
Lactic Acid, Venous: 5.1 mmol/L (ref 0.5–1.9)
Lactic Acid, Venous: 5.4 mmol/L (ref 0.5–1.9)

## 2024-02-18 LAB — TROPONIN T, HIGH SENSITIVITY
Troponin T High Sensitivity: 69 ng/L — ABNORMAL HIGH (ref 0–19)
Troponin T High Sensitivity: 69 ng/L — ABNORMAL HIGH (ref 0–19)
Troponin T High Sensitivity: 61 ng/L — ABNORMAL HIGH (ref 0–19)

## 2024-02-18 LAB — CBG MONITORING, ED: Glucose-Capillary: 86 mg/dL (ref 70–99)

## 2024-02-18 MED ORDER — ONDANSETRON HCL 4 MG/2ML IJ SOLN
4.0000 mg | Freq: Once | INTRAMUSCULAR | Status: AC
  Administered 2024-02-18: 4 mg via INTRAVENOUS

## 2024-02-18 MED ORDER — INSULIN ASPART 100 UNIT/ML IJ SOLN: 0.0000 [IU] | Freq: Three times a day (TID) | INTRAMUSCULAR | Status: DC

## 2024-02-18 MED ORDER — SODIUM CHLORIDE 0.9% FLUSH
3.0000 mL | Freq: Two times a day (BID) | INTRAVENOUS | Status: DC
  Administered 2024-02-18 – 2024-02-23 (×11): 3 mL via INTRAVENOUS

## 2024-02-18 MED ORDER — ONDANSETRON HCL 4 MG PO TABS: 4.0000 mg | ORAL_TABLET | Freq: Four times a day (QID) | ORAL | Status: DC | PRN

## 2024-02-18 MED ORDER — LACTATED RINGERS IV SOLN: INTRAVENOUS | Status: DC

## 2024-02-18 MED ORDER — PANTOPRAZOLE SODIUM 40 MG IV SOLR
40.0000 mg | INTRAVENOUS | Status: DC
  Administered 2024-02-18: 40 mg via INTRAVENOUS
  Filled 2024-02-18: qty 10

## 2024-02-18 MED ORDER — LACTATED RINGERS IV BOLUS
500.0000 mL | Freq: Once | INTRAVENOUS | Status: AC
  Administered 2024-02-18: 500 mL via INTRAVENOUS

## 2024-02-18 MED ORDER — PIPERACILLIN-TAZOBACTAM 3.375 G IVPB
3.3750 g | Freq: Three times a day (TID) | INTRAVENOUS | Status: DC
  Administered 2024-02-18 – 2024-02-27 (×27): 3.375 g via INTRAVENOUS
  Filled 2024-02-18 (×28): qty 50

## 2024-02-18 MED ORDER — LACTATED RINGERS IV BOLUS
1000.0000 mL | Freq: Once | INTRAVENOUS | Status: AC
  Administered 2024-02-18: 1000 mL via INTRAVENOUS

## 2024-02-18 MED ORDER — HYDROMORPHONE HCL 1 MG/ML IJ SOLN
0.5000 mg | INTRAMUSCULAR | Status: DC | PRN
  Administered 2024-02-19: 1 mg via INTRAVENOUS
  Filled 2024-02-18: qty 1

## 2024-02-18 MED ORDER — ONDANSETRON HCL 4 MG/2ML IJ SOLN
INTRAMUSCULAR | Status: AC
  Filled 2024-02-18: qty 2

## 2024-02-18 MED ORDER — MORPHINE SULFATE (PF) 4 MG/ML IV SOLN
4.0000 mg | Freq: Once | INTRAVENOUS | Status: AC
  Administered 2024-02-18: 4 mg via INTRAVENOUS
  Filled 2024-02-18: qty 1

## 2024-02-18 MED ORDER — FENTANYL CITRATE (PF) 50 MCG/ML IJ SOSY
50.0000 ug | PREFILLED_SYRINGE | Freq: Once | INTRAMUSCULAR | Status: AC
  Administered 2024-02-18: 50 ug via INTRAVENOUS
  Filled 2024-02-18: qty 1

## 2024-02-18 MED ORDER — SENNOSIDES-DOCUSATE SODIUM 8.6-50 MG PO TABS: 1.0000 | ORAL_TABLET | Freq: Every evening | ORAL | Status: DC | PRN

## 2024-02-18 MED ORDER — ACETAMINOPHEN 325 MG PO TABS: 650.0000 mg | ORAL_TABLET | Freq: Four times a day (QID) | ORAL | Status: DC | PRN

## 2024-02-18 MED ORDER — PIPERACILLIN-TAZOBACTAM 3.375 G IVPB 30 MIN
3.3750 g | Freq: Once | INTRAVENOUS | Status: AC
  Administered 2024-02-18: 3.375 g via INTRAVENOUS
  Filled 2024-02-18: qty 50

## 2024-02-18 MED ORDER — ACETAMINOPHEN 650 MG RE SUPP: 650.0000 mg | Freq: Four times a day (QID) | RECTAL | Status: DC | PRN

## 2024-02-18 MED ORDER — ONDANSETRON HCL 4 MG/2ML IJ SOLN: 4.0000 mg | Freq: Four times a day (QID) | INTRAMUSCULAR | Status: DC | PRN

## 2024-02-18 MED ORDER — INSULIN ASPART 100 UNIT/ML IJ SOLN: 0.0000 [IU] | Freq: Every day | INTRAMUSCULAR | Status: DC

## 2024-02-18 MED ORDER — IOHEXOL 300 MG/ML  SOLN
75.0000 mL | Freq: Once | INTRAMUSCULAR | Status: AC | PRN
  Administered 2024-02-18: 75 mL via INTRAVENOUS

## 2024-02-18 NOTE — ED Notes (Signed)
 Called lab to get blood cultures.

## 2024-02-18 NOTE — ED Provider Notes (Signed)
 "  [facility] Provider Note    Event Date/Time   First MD Initiated Contact with Patient 02/18/24 1247     (approximate)   History   Abdominal Pain   HPI  Ashley Ryan is a 79 y.o. female who presents to the ED for evaluation of Abdominal Pain   Review GI outpatient evaluation from August.  Iron -deficiency anemia, gastritis and hiatal hernia.  HTN, HLD.  Remote history of cholecystectomy and hysterectomy.  Patient presents to the ED alongside daughter for evaluation of 1-2 days of generalized abdominal discomfort and emesis.  No bowel movements for 4 to 5 days.  Notably her triage note indicates patient was having chest discomfort and shortness of breath.  She does not tell me about any of this and indicates that is just her abdomen causing significant discomfort.  Physical Exam   Triage Vital Signs: ED Triage Vitals  Encounter Vitals Group     BP 02/18/24 1240 (!) 73/64     Girls Systolic BP Percentile --      Girls Diastolic BP Percentile --      Boys Systolic BP Percentile --      Boys Diastolic BP Percentile --      Pulse Rate 02/18/24 1247 (!) 128     Resp 02/18/24 1240 (!) 26     Temp 02/18/24 1237 97.8 F (36.6 C)     Temp Source 02/18/24 1237 Oral     SpO2 02/18/24 1247 100 %     Weight --      Height --      Head Circumference --      Peak Flow --      Pain Score 02/18/24 1239 8     Pain Loc --      Pain Education --      Exclude from Growth Chart --     Most recent vital signs: Vitals:   02/18/24 1430 02/18/24 1500  BP: 123/70 105/63  Pulse: (!) 116 (!) 120  Resp: (!) 21 (!) 24  Temp:    SpO2: 99% 100%    General: Awake, no distress.  Seems.  Hard of hearing CV:  Good peripheral perfusion.  Resp:  Normal effort.  Abd:  Mildly distended but not tense.  Diffusely tender with voluntary guarding MSK:  No deformity noted.  Neuro:  No focal deficits appreciated. Other:     ED Results / Procedures / Treatments    Labs (all labs ordered are listed, but only abnormal results are displayed) Labs Reviewed  COMPREHENSIVE METABOLIC PANEL WITH GFR - Abnormal; Notable for the following components:      Result Value   Glucose, Bld 143 (*)    BUN 26 (*)    Creatinine, Ser 1.51 (*)    Total Protein 6.3 (*)    AST 64 (*)    ALT 64 (*)    GFR, Estimated 35 (*)    Anion gap 20 (*)    All other components within normal limits  CBC - Abnormal; Notable for the following components:   WBC 18.7 (*)    RDW 18.0 (*)    All other components within normal limits  URINALYSIS, ROUTINE W REFLEX MICROSCOPIC - Abnormal; Notable for the following components:   Color, Urine YELLOW (*)    APPearance HAZY (*)    Protein, ur 30 (*)    Bacteria, UA RARE (*)    All other components within normal limits  LACTIC ACID, PLASMA -  Abnormal; Notable for the following components:   Lactic Acid, Venous >9.0 (*)    All other components within normal limits  TROPONIN T, HIGH SENSITIVITY - Abnormal; Notable for the following components:   Troponin T High Sensitivity 69 (*)    All other components within normal limits  TROPONIN T, HIGH SENSITIVITY - Abnormal; Notable for the following components:   Troponin T High Sensitivity 69 (*)    All other components within normal limits  CULTURE, BLOOD (ROUTINE X 2)  CULTURE, BLOOD (ROUTINE X 2)  LIPASE, BLOOD    EKG Sinus tachycardia with a rate of 137 bpm.  Normal axis.  No high-grade blocks.  No STEMI.  Nonspecific changes are present  RADIOLOGY CXR interpreted by me with a large intrathoracic stomach  Official radiology report(s): DG Chest 1 View Result Date: 02/18/2024 CLINICAL DATA:  Shortness of breath EXAM: CHEST  1 VIEW COMPARISON:  Prior chest x-ray 11/03/2017 FINDINGS: Very large hiatal hernia with significant gaseous distension of both the intrathoracic and intraabdominal component of the stomach. Findings suggest possible gastric outlet obstruction. The size of the  hiatal hernia has significantly increased compared to prior and there is increased atelectasis in the right lung around the hernia. The lungs are otherwise clear. No large effusion, pulmonary edema or pneumothorax. IMPRESSION: 1. Significant interval enlargement of the hiatal hernia with diffuse gaseous distension of both the intrathoracic and intra-abdominal portions of the stomach. This suggests the possibility of gastric outlet obstruction. Recommend further evaluation with contrast-enhanced CT scan of the abdomen and pelvis. 2. Slightly increased atelectasis in the right lung surrounding the enlarging hiatal hernia. Otherwise, no acute cardiopulmonary process. Electronically Signed   By: Wilkie Lent M.D.   On: 02/18/2024 14:00    PROCEDURES and INTERVENTIONS:  .1-3 Lead EKG Interpretation  Performed by: Claudene Rover, MD Authorized by: Claudene Rover, MD     Interpretation: abnormal     ECG rate:  116   ECG rate assessment: tachycardic     Rhythm: sinus tachycardia     Ectopy: none     Conduction: normal   .Critical Care  Performed by: Claudene Rover, MD Authorized by: Claudene Rover, MD   Critical care provider statement:    Critical care time (minutes):  30   Critical care time was exclusive of:  Separately billable procedures and treating other patients   Critical care was necessary to treat or prevent imminent or life-threatening deterioration of the following conditions:  Sepsis and trauma   Critical care was time spent personally by me on the following activities:  Development of treatment plan with patient or surrogate, discussions with consultants, evaluation of patient's response to treatment, examination of patient, ordering and review of laboratory studies, ordering and review of radiographic studies, ordering and performing treatments and interventions, pulse oximetry, re-evaluation of patient's condition and review of old charts   Medications  piperacillin -tazobactam  (ZOSYN ) IVPB 3.375 g (3.375 g Intravenous New Bag/Given 02/18/24 1454)  lactated ringers  bolus 1,000 mL (0 mLs Intravenous Stopped 02/18/24 1452)  ondansetron  (ZOFRAN ) injection 4 mg (4 mg Intravenous Given 02/18/24 1314)  morphine  (PF) 4 MG/ML injection 4 mg (4 mg Intravenous Given 02/18/24 1319)     IMPRESSION / MDM / ASSESSMENT AND PLAN / ED COURSE  I reviewed the triage vital signs and the nursing notes.  Differential diagnosis includes, but is not limited to, SBO, volvulus, ischemic bowel, acute appendicitis, rupture,  {Patient presents with symptoms of an acute illness or injury that is  potentially life-threatening.  Patient presents with 1 day of generalized abdominal pain, nausea and vomiting.  Initially hypotensive in response to fluids without indications for vasopressors.  Somewhat concerning diffuse tenderness.  AKI.  Leukocytosis.  Lactic acid is remarkably elevated.  Start IV fluids, empiric antibiotics and awaiting CT at the time of signout.   Clinical Course as of 02/18/24 1508  Wed Feb 18, 2024  1329 reassessed [DS]  1414 USIV , 20g right cephalic v.  Apparently her blood that was sent from triage for initial diagnostics was lost in lab does not have it. [DS]  1507 Lactic acid noted.  Patient signed out to oncoming physician pending CT abdomen/pelvis with the understanding of likely admission or surgical evaluation [DS]    Clinical Course User Index [DS] Claudene Rover, MD     FINAL CLINICAL IMPRESSION(S) / ED DIAGNOSES   Final diagnoses:  Generalized abdominal pain  Nausea and vomiting, unspecified vomiting type     Rx / DC Orders   ED Discharge Orders     None        Note:  This document was prepared using Dragon voice recognition software and may include unintentional dictation errors.   Claudene Rover, MD 02/18/24 [phone]  "

## 2024-02-18 NOTE — ED Triage Notes (Signed)
 Pt to ED via [facility] from home. Daughter reports pt was complaining of N/V and abd pain last pm. Daughter received a call today that pt seemed to be doing worse. Pt groaning from pain. Pt states today is also having CP and SOB. Pt is HOH and does not have hearing aids.

## 2024-02-18 NOTE — ED Notes (Signed)
 CRITICAL VALUE STICKER  CRITICAL VALUE:Lactic acid greater than 9  RECEIVER (on-site recipient of call):Ashley Ryan  DATE & TIME NOTIFIED: 02/18/24 1459  MESSENGER (representative from lab):Lab  MD NOTIFIED: Claudene  TIME OF NOTIFICATION:1500  RESPONSE:

## 2024-02-18 NOTE — H&P (Addendum)
 " History and Physical    Ashley Ryan FMW:[id number] DOB: 06/17/1945 DOA: 02/18/2024  DOS: the patient was seen and examined on 02/18/2024  PCP: Avelina Greig BRAVO, MD   Patient coming from: Home  I have personally given this, patient's old medical records in [facility] and CareEverywhere  HPI:   Ashley Ryan is a 79 y.o. 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  Per patient this started 2 days ago but since yesterday she has been progressively getting worse.  States has not had a bowel movement since 2 days. She has had multiple episodes of nausea and vomiting. Given worsening symptoms, they called EMS to get further care. On arrival to the ED patient was noted to be HDS stable.  Lab work and imaging obtained.  CBC with significant leukocytosis to 18.7, normal hemoglobin with elevated hematocrit consistent with hemoconcentration.  CMP with moderate hyperglycemia, AKI, ALT and AST elevation.  Lipase normal.  Troponin mildly elevated but stable.  Chest x-ray shows some laboratory values of the hiatal hernia with gaseous distention into the thoracic cavity.  CT shows high-grade small bowel obstruction nonobstructive.  Baby has a right lower quadrant/pelvis.  There is no pneumatosis or portal venous gas.  No intraperitoneal free air.  It showed very large hiatal hernia with of the stomach above the head of the diaphragm and mild distention.  Lactic acid elevated greater than 9 with repeat pending.  Given leukocytosis blood cultures obtained and patient started on antibiotics.  General surgery consulted and evaluated patient and agreed with NG tube for now with repeat lab work and 6 hours to determine if surgical intervention is indicated.  They stated no emergent surgical needs.  Given this, [facility]  contacted for admission.  Review of Systems: As mentioned in the history of present illness. All other systems reviewed and are negative.   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  Mastectomy/ SLN.   Chest pain    a. 10/2017 Cath: Nl cors.   Chronic headache    Chronic heart failure with preserved ejection fraction (HFpEF) (HCC)    a. 10/2021 Echo: EF 60-65%, no rwma, mild LVH, GrI DD, nl RV size/fxn, mild MR.   Complication of anesthesia    prior to 1991 used to have PONV.  none recently.   Diverticulosis of colon    Family history of adverse reaction to anesthesia    mother/daighter get sick   GERD with stricture    Hard of hearing    Hiatal hernia    Hypokalemia    Labile hypertension    a. 10/2017 Cath: nl renal arteries.   Mitral regurgitation    a. 10/2021 Echo: Mild MR.   Mixed hyperlipidemia    Neuropathy    feet   Osteoporosis    Pre-diabetes    Pulmonary hypertension (HCC)    a. 10/2017 Echo: PASP 35-34mmHg.   Wears dentures    full upper    Past Surgical History:  Procedure Laterality Date   ABDOMINAL HYSTERECTOMY  1978   one ovary remains   BREAST BIOPSY Left 05/26/2017   Affirm Bx- coil clip Ductal carcinoma in situ, high nuclear grade with  calcifications and focal comedonecrosis   CARDIAC CATHETERIZATION     yrs ago all OK.   CATARACT EXTRACTION W/PHACO Left 02/21/2016   Procedure: CATARACT EXTRACTION PHACO AND INTRAOCULAR LENS PLACEMENT (IOC)  Left eye;  Surgeon: Dene Etienne, MD;  Location[facility];  Service: Ophthalmology;  Laterality: Left;  Left eye Diabetic   CATARACT EXTRACTION W/PHACO Right 03/13/2016   Procedure: CATARACT EXTRACTION PHACO AND INTRAOCULAR LENS PLACEMENT (IOC)  right  diabetic;  Surgeon: Dene Etienne, MD;  Location: [facility];  Service: Ophthalmology;  Laterality: Right;  diabetic    CHOLECYSTECTOMY  1978   COLONOSCOPY N/A 11/05/2023   Procedure: COLONOSCOPY;  Surgeon: Unk Corinn Skiff, MD;  Location: [facility] ENDOSCOPY;  Service: Gastroenterology;  Laterality: N/A;   COLONOSCOPY W/ POLYPECTOMY  09/09/2013   8 mm tubular adenoma of the cecum.  Lamar Aho, MD. [facility]   COLONOSCOPY WITH PROPOFOL  N/A 06/04/2019   Procedure: COLONOSCOPY WITH PROPOFOL ;  Surgeon: Jinny Carmine, MD;  Location: [facility] ENDOSCOPY;  Service: Endoscopy;  Laterality: N/A;   ESOPHAGOGASTRODUODENOSCOPY N/A 11/05/2023   Procedure: EGD (ESOPHAGOGASTRODUODENOSCOPY);  Surgeon: Unk Corinn Skiff, MD;  Location: [facility] ENDOSCOPY;  Service: Gastroenterology;  Laterality: N/A;   ESOPHAGOGASTRODUODENOSCOPY (EGD) WITH PROPOFOL  N/A 06/04/2019   Procedure: ESOPHAGOGASTRODUODENOSCOPY (EGD) WITH PROPOFOL ;  Surgeon: Jinny Carmine, MD;  Location: [facility] ENDOSCOPY;  Service: Endoscopy;  Laterality: N/A;   JOINT REPLACEMENT Left    knee    LEFT HEART CATH AND CORONARY ANGIOGRAPHY N/A 11/06/2017   Procedure: LEFT HEART CATH AND CORONARY ANGIOGRAPHY;  Surgeon: Mady Bruckner, MD;  Location: [facility] INVASIVE CV LAB;  Service: Cardiovascular;  Laterality: N/A;   MASTECTOMY Left 06/13/2017   mastectomy neg margins   OOPHORECTOMY     one still left   RENAL ANGIOGRAPHY  11/06/2017   Procedure: RENAL ANGIOGRAPHY;  Surgeon: Mady Bruckner, MD;  Location: [facility] INVASIVE CV LAB;  Service: Cardiovascular;;   SENTINEL NODE BIOPSY Left 06/13/2017   Procedure: SENTINEL NODE BIOPSY;  Surgeon: Dessa Reyes ORN, MD;  Location: [facility] ORS;  Service: General;  Laterality: Left;   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  Surgeon: Dessa Reyes ORN, MD;  Location: [facility] ORS;  Service: General;  Laterality: Left;     Allergies[1]  Family History  Problem Relation Age of Onset   Breast cancer Other    Stomach cancer Other    Diabetes Other    Heart disease  Other    Breast cancer Sister 56   Esophageal cancer Brother    Hypertension Mother    Heart attack Mother 33   Hypertension Father    Coronary artery disease Father 61       CABG   Stroke Father     Prior to Admission medications  Medication Sig Start Date End Date Taking? Authorizing Provider  Alcohol Swabs (DROPSAFE ALCOHOL PREP) 70 % PADS USE  TO TEST BLOOD SUGAR ONE TIME DAILY 06/25/21   Bedsole, Amy E, MD  amLODipine  (NORVASC ) 5 MG tablet Take 1 tablet (5 mg total) by mouth daily. 01/28/24   Bedsole, Amy E, MD  atorvastatin  (LIPITOR) 40 MG tablet Take 1 tablet (40 mg total) by mouth at bedtime. 01/23/24   Bedsole, Amy E, MD  Blood Glucose Calibration (ACCU-CHEK AVIVA) SOLN Use to check blood sugar once daily 10/27/18   Bedsole, Amy E, MD  Blood Glucose Calibration (TRUE METRIX LEVEL 1) Low SOLN USE AS DIRECTED 06/12/20  Bedsole, Amy E, MD  Blood Glucose Monitoring Suppl (TRUE METRIX AIR GLUCOSE METER) w/Device KIT Use to check blood sugar daily 04/30/21   Bedsole, Amy E, MD  brimonidine  (ALPHAGAN ) 0.2 % ophthalmic solution brimonidine  0.2 % eye drops  INSTILL 1 DROP INTO BOTH EYES TWICE A DAY    [provider]  busPIRone  (BUSPAR ) 5 MG tablet Take 1 tablet (5 mg total) by mouth 2 (two) times daily. 01/23/24   Bedsole, Amy E, MD  Calcium  Carb-Cholecalciferol  (CALCIUM  600 + D) 600-5 MG-MCG TABS Take 2 tablets by mouth daily. 09/12/21   Babara Call, MD  diclofenac  (VOLTAREN ) 75 MG EC tablet Take 1 tablet (75 mg total) by mouth 2 (two) times daily. 01/23/24   Bedsole, Amy E, MD  diclofenac  Sodium (VOLTAREN ) 1 % GEL APPLY 2 GRAMS TOPICALLY TO THE AFFECTED AREA 4 TIMES DAILY. 06/25/21   Bedsole, Amy E, MD  esomeprazole  (NEXIUM ) 40 MG capsule TAKE 1 CAPSULE TWICE DAILY BEFORE MEALS 01/23/24   Bedsole, Amy E, MD  fluticasone  (FLONASE ) 50 MCG/ACT nasal spray USE 2 SPRAYS INTO THE NOSE DAILY AS DIRECTED (SUBSTITUTED FOR FLONASE ) 01/23/24   Bedsole, Amy E, MD  furosemide  (LASIX ) 20 MG tablet TAKE 1 TABLET  BY MOUTH EVERY DAY AS NEEDED 01/15/18   Bedsole, Amy E, MD  gabapentin  (NEURONTIN ) 100 MG capsule TAKE 1 CAPSULE EVERY MORNING , 1 CAPSULE AT LUNCH, 1 CAPSULE AT DINNER, AND 2-3 CAPSULES AT BEDTIME 01/28/24   Bedsole, Amy E, MD  Glucosamine 500 MG TABS Take 500 mg by mouth daily.    [provider]  glucose blood (TRUE METRIX BLOOD GLUCOSE TEST) test strip Use to check blood sugar daily 04/30/21   Bedsole, Amy E, MD  Iron -Vitamin C  65-125 MG TABS Take 1 tablet by mouth daily at 2 PM. 03/15/22   Babara Call, MD  isosorbide  mononitrate (IMDUR ) 30 MG 24 hr tablet Take 1 tablet (30 mg total) by mouth daily. 01/23/24   Bedsole, Amy E, MD  latanoprost  (XALATAN ) 0.005 % ophthalmic solution Place 1 drop into both eyes at bedtime.    [provider]  lidocaine  (LIDODERM ) 5 % APPLY 1 PATCH ONTO THE SKIN EVERY DAY. REMOVE AND DISCARD PATCH WITHIN 12 HOURS OR AS DIRECTED BY PHYSICIAN. 01/23/24   Bedsole, Amy E, MD  losartan -hydrochlorothiazide  (HYZAAR) 100-25 MG tablet Take 1 tablet by mouth daily.    [provider]  mirabegron  ER (MYRBETRIQ ) 25 MG TB24 tablet Take 1 tablet (25 mg total) by mouth daily. 01/23/24   Bedsole, Amy E, MD  Omega-3 Fatty Acids (FISH OIL) 1000 MG CAPS Take 1,000 mg by mouth daily.     [provider]  polycarbophil (FIBERCON) 625 MG tablet Take 625 mg by mouth daily.    [provider]  potassium chloride  SA (KLOR-CON  M) 20 MEQ tablet Take 1 tablet (20 mEq total) by mouth 2 (two) times daily. 01/28/24   Bedsole, Amy E, MD  traMADol  (ULTRAM ) 50 MG tablet Take 1 tablet (50 mg total) by mouth every 6 (six) hours as needed. for pain 01/28/24   Bedsole, Amy E, MD  triamcinolone  cream (KENALOG ) 0.5 % Apply 1 application topically 2 (two) times daily. 08/12/19   Bedsole, Amy E, MD  TRUEplus Lancets 28G MISC TEST BLOOD SUGAR EVERY DAY 06/28/21   Avelina Greig BRAVO, MD    Social History:  reports that she has never smoked. She has never used smokeless tobacco. She  reports that she does not drink alcohol and  does not use drugs.    Physical Exam: Vitals:   02/18/24 1645 02/18/24 1700 02/18/24 1716 02/18/24 1740  BP: 113/70 (!) 159/133  95/60  Pulse: (!) 119     Resp: 18 (!) 21  20  Temp:   98 F (36.7 C)   TempSrc:   Oral   SpO2: 96%   98%    Gen: NAD HENT: NCAT CV: tachycardic rate, sinus rhythm Lung: CTAB Abd: TTP of entire abdomen, no guarding. Absent bowel sounds MSK: No asymmetry, decreased bulk and tone Neuro: alert and oriented x4   Labs on Admission: I have personally reviewed following labs and imaging studies  CBC: Recent Labs  Lab 02/18/24 1417  WBC 18.7*  HGB 13.5  HCT 43.7  MCV 85.7  PLT 235   Basic Metabolic Panel: Recent Labs  Lab 02/18/24 1417  NA 141  K 3.7  CL 99  CO2 23  GLUCOSE 143*  BUN 26*  CREATININE 1.51*  CALCIUM  10.1   GFR: CrCl cannot be calculated (Unknown ideal weight.). Liver Function Tests: Recent Labs  Lab 02/18/24 1417  AST 64*  ALT 64*  ALKPHOS 80  BILITOT 0.6  PROT 6.3*  ALBUMIN  4.0   Recent Labs  Lab 02/18/24 1417  LIPASE 17   No results for input(s): AMMONIA in the last 168 hours. Coagulation Profile: No results for input(s): INR, PROTIME in the last 168 hours. Cardiac Enzymes: No results for input(s): CKTOTAL, CKMB, CKMBINDEX, TROPONINI, TROPONINIHS in the last 168 hours. BNP (last 3 results) No results for input(s): BNP in the last 8760 hours. HbA1C: No results for input(s): HGBA1C in the last 72 hours. CBG: No results for input(s): GLUCAP in the last 168 hours. Lipid Profile: No results for input(s): CHOL, HDL, LDLCALC, TRIG, CHOLHDL, LDLDIRECT in the last 72 hours. Thyroid  Function Tests: No results for input(s): TSH, T4TOTAL, FREET4, T3FREE, THYROIDAB in the last 72 hours. Anemia Panel: No results for input(s): VITAMINB12, FOLATE, FERRITIN, TIBC, IRON , RETICCTPCT in the last 72 hours. Urine  analysis:    Component Value Date/Time   COLORURINE YELLOW (A) 02/18/2024 1417   APPEARANCEUR HAZY (A) 02/18/2024 1417   APPEARANCEUR Clear 11/11/2013 0904   LABSPEC 1.016 02/18/2024 1417   LABSPEC 1.018 11/11/2013 0904   PHURINE 5.0 02/18/2024 1417   GLUCOSEU NEGATIVE 02/18/2024 1417   GLUCOSEU Negative 11/11/2013 0904   HGBUR NEGATIVE 02/18/2024 1417   BILIRUBINUR NEGATIVE 02/18/2024 1417   BILIRUBINUR Negative 12/31/2018 1113   BILIRUBINUR Negative 11/11/2013 0904   KETONESUR NEGATIVE 02/18/2024 1417   PROTEINUR 30 (A) 02/18/2024 1417   UROBILINOGEN 1.0 12/31/2018 1113   NITRITE NEGATIVE 02/18/2024 1417   LEUKOCYTESUR NEGATIVE 02/18/2024 1417   LEUKOCYTESUR Negative 11/11/2013 0904    Radiological Exams on Admission: I have personally reviewed images DG Abd Portable 1 View Result Date: 02/18/2024 EXAM: 1 VIEW XRAY OF THE ABDOMEN 02/18/2024 04:20:00 PM COMPARISON: None available. CLINICAL HISTORY: NG placement. FINDINGS: LINES, TUBES AND DEVICES: NG tube terminates in a large hiatal hernia in the right lower chest. LUNGS: Adjacent passive atelectasis noted at the right lung base. BOWEL: Loops of mildly dilated small bowel are present in the central abdomen. The pelvis is excluded. SOFT TISSUES: No abnormal calcifications. BONES: No acute fracture. IMPRESSION: 1. Nasogastric tube terminates within a large hiatal hernia in the right lower chest. 2. Mildly dilated small bowel loops in the central abdomen. Electronically signed by: Ryan Salvage MD 02/18/2024 04:58 PM EST RP Workstation: [id number]   CT ABDOMEN PELVIS  W CONTRAST Result Date: 02/18/2024 EXAM: CT ABDOMEN AND PELVIS WITH CONTRAST 02/18/2024 03:55:05 PM TECHNIQUE: CT of the abdomen and pelvis was performed with the administration of intravenous contrast. Multiplanar reformatted images are provided for review. Automated exposure control, iterative reconstruction, and/or weight-based adjustment of the mA/kV was utilized to  reduce the radiation dose to as low as reasonably achievable. COMPARISON: None available. CLINICAL HISTORY: N/V. diffusely tender, bloated. Nausea/vomiting. Diffusely tender, bloated. FINDINGS: LOWER CHEST: Very large sliding top hiatal hernia with half the stomach above the hemidiaphragm and the posterior right hemithorax. The stomach is markedly distended with a large volume of fluid and a fluid level. The NG tube seen on comparison radiograph has been removed. LIVER: There is mild intrahepatic biliary ductal dilatation. No pneumatosis or portal venous gas. GALLBLADDER AND BILE DUCTS: Patient is status post cholecystectomy. SPLEEN: No acute abnormality. PANCREAS: No acute abnormality. ADRENAL GLANDS: No acute abnormality. KIDNEYS, URETERS AND BLADDER: No stones in the kidneys or ureters. No hydronephrosis. No perinephric or periureteral stranding. Urinary bladder is unremarkable. GI AND BOWEL: There is a high grade small bowel obstruction. Dilated loops of the duodenum, proximal jejunum, and ileum are present with air fluid levels within loops of bowel in the left abdomen, mid pelvis, and lower abdomen. The distal ileum leading up to the terminal ileum is completely decompressed. No discrete transition point is identified but the obstruction is favored to be in the right lower quadrant/right pelvis. There is haziness within the mesentery of the small bowel in the pelvis. The colon is relatively collapsed. No pneumatosis or portal venous gas. PERITONEUM AND RETROPERITONEUM: Small amount of free fluid in the upper abdomen along the left pericardial gutter related to small bowel obstruction and venous congestion. No intraperitoneal free air. VASCULATURE: Aorta is normal in caliber. LYMPH NODES: No lymphadenopathy. REPRODUCTIVE ORGANS: No acute abnormality. BONES AND SOFT TISSUES: No acute osseous abnormality. No focal soft tissue abnormality. IMPRESSION: 1. High grade small bowel obstruction with obstruction favored  to be in the right lower quadrant slash right pelvis . 2. Small amount of free fluid and hazy mesentery within the distal small bowel and along the left pericardial gutter related to small bowel obstruction and venous congestion . 3. No pneumatosis or portal venous gas.no intraperitoneal free air. . 4. Very large hiatal hernia with half the stomach above the hemidiaphragm and markedly distended . 5. findings conveyed to Dr Marinda at time interpretation Electronically signed by: Norleen Boxer MD 02/18/2024 04:37 PM EST RP Workstation: [id number]   DG Chest 1 View Result Date: 02/18/2024 CLINICAL DATA:  Shortness of breath EXAM: CHEST  1 VIEW COMPARISON:  Prior chest x-ray 11/03/2017 FINDINGS: Very large hiatal hernia with significant gaseous distension of both the intrathoracic and intraabdominal component of the stomach. Findings suggest possible gastric outlet obstruction. The size of the hiatal hernia has significantly increased compared to prior and there is increased atelectasis in the right lung around the hernia. The lungs are otherwise clear. No large effusion, pulmonary edema or pneumothorax. IMPRESSION: 1. Significant interval enlargement of the hiatal hernia with diffuse gaseous distension of both the intrathoracic and intra-abdominal portions of the stomach. This suggests the possibility of gastric outlet obstruction. Recommend further evaluation with contrast-enhanced CT scan of the abdomen and pelvis. 2. Slightly increased atelectasis in the right lung surrounding the enlarging hiatal hernia. Otherwise, no acute cardiopulmonary process. Electronically Signed   By: Wilkie Lent M.D.   On: 02/18/2024 14:00    EKG: My personal interpretation of  EKG shows: Sinus tachycardia without acute ST changes    Assessment/Plan Principal Problem:   Small bowel obstruction (HCC) Active Problems:   Controlled type 2 diabetes with neuropathy (HCC)   Diabetic peripheral neuropathy (HCC)   Diaphragmatic  hernia   GERD (gastroesophageal reflux disease)   Hypertension associated with diabetes (HCC)   MDD (major depressive disorder), mild (HCC)   (HFpEF) heart failure with preserved ejection fraction (HCC)   Pulmonary hypertension (HCC)   CAD (coronary artery disease), nonobstructive   OAB (overactive bladder)   Patient with abdominal pain, nausea and vomiting found to have small bowel obstruction.  General surgery consulted.  NG tube placed.  Per general surgery no current needs or emergent surgery but if patient does not improve with these measures she will need surgical decompression.  Currently no pneumatosis or other signs of bowel ischemia present.  Lactic acid initially at 9 which appears to be secondary to dehydration and has improved to 7.9 with IVF.  Will continue to trend.  Will continue intravenous fluids.  Patient currently n.p.o. and will maintain that status.  Plan for serial abdominal exams.  Pain regimen ordered.  Given leukocytosis, patient started on Zosyn  and will continue this for now but low threshold to discontinue this as suspect leukocytosis along with elevated hematocrit secondary to hemoconcentration from dehydration.   AKI: likely prerenal as above. Will continue IVF and continue to monitor. Bladder scan ordered given overactive bladder.   Hypertension: Patient's blood pressure is borderline normotensive.  Given the softer blood pressures will consult PCCM for evaluation.   Hyperlipidemia: Holding home statin until patient able to tolerate p.o. intake.  Type 2 diabetes: It appears patient not on any antihyperglycemic.  Will place patient on SSI  GERD: Will place patient on IV PPI given history of hiatal hernia.  Will resume home PPI once patient able to tolerate p.o. intake.  Overactive bladder: Continue home regimenable to tolerate p.o. intake.  MDD/GAD: Continue home regimen once patient will tolerate p.o. intake.  HFpEF: Currently appears dehydrated.  Getting  IVF.  Will continue to monitor volume status.  Weakness: Likely secondary to small bowel infection and decreased p.o. intake.  PT and OT consulted. VTE prophylaxis: SCD Diet: N.p.o. Code Status:  Full Code Telemetry:  Admission status: Inpatient, Progressive Patient is from: Home Anticipated d/c is to: Home Anticipated d/c is in: 2-3 days   Family Communication: Updated at bedside  Consults called: General Surgery    Severity of Illness: The appropriate patient status for this patient is INPATIENT. Inpatient status is judged to be reasonable and necessary in order to provide the required intensity of service to ensure the patient's safety. The patient's presenting symptoms, physical exam findings, and initial radiographic and laboratory data in the context of their chronic comorbidities is felt to place them at high risk for further clinical deterioration. Furthermore, it is not anticipated that the patient will be medically stable for discharge from the hospital within 2 midnights of admission.   * I certify that at the point of admission it is my clinical judgment that the patient will require inpatient hospital care spanning beyond 2 midnights from the point of admission due to high intensity of service, high risk for further deterioration and high frequency of surveillance required.DEWAINE Morene Bathe, MD [facility] DEL. [facility]     [1]  Allergies Allergen Reactions   Oxycodone  Nausea Only   Sulfa Antibiotics Rash    Childhood reaction   Sulfonamide Derivatives  Rash    Childhood reaction.   "

## 2024-02-18 NOTE — ED Triage Notes (Signed)
 First Nurse Note:  Pt via [facility] from home. Pt c/o NV and abd pain since yesterday. Pt is A&Ox4 and NAD  EMS reports 110/70 BP, 128 HR, 98% on RA

## 2024-02-18 NOTE — ED Notes (Signed)
 Surgical team at bedside

## 2024-02-18 NOTE — ED Notes (Signed)
 Antibiotics administered prior to blood culture collection per Dr. Claudene due to patient being a difficult stick. Phlebotomy contacted to obtain blood cultures.

## 2024-02-18 NOTE — ED Notes (Signed)
 Requested phlebotomy again to draw blood cultures

## 2024-02-18 NOTE — Consult Note (Signed)
 "  NAME:  Ashley Ryan, MRN:  [id number], DOB:  08/23/1945, LOS: 0 ADMISSION DATE:  02/18/2024, CONSULTATION DATE:  02/18/2024 REFERRING MD:  Dr. Fernand, CHIEF COMPLAINT:  Hypotension    Brief Pt Description / Synopsis:  79 y.o. female admitted with Small Bowel Obstruction and AKI.  With soft blood pressures, PCCM consulted in case were to need vasopressors.   History of Present Illness:  Ashley Ryan is a 79 y.o. 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  Per patient this started 2 days ago but since yesterday she has been progressively getting worse.  States has not had a bowel movement since 2 days. She has had multiple episodes of nausea and vomiting. Given worsening symptoms, they called EMS to get further care   ED Course: Initial Vital Signs: Temperature 97.8 F, RR 26, pulse 128, BP 73/64, SpO2 100% on room air Significant Labs: leukocytosis to 18.7, normal hemoglobin with elevated hematocrit consistent with hemoconcentration. CMP with moderate hyperglycemia, AKI, ALT and AST elevation. Lipase normal. Troponin mildly elevated but stable.  Imaging Chest X-ray>>IMPRESSION: 1. Significant interval enlargement of the hiatal hernia with diffuse gaseous distension of both the intrathoracic and intra-abdominal portions of the stomach. This suggests the possibility of gastric outlet obstruction. Recommend further evaluation with contrast-enhanced CT scan of the abdomen and pelvis. 2. Slightly increased atelectasis in the right lung surrounding the enlarging hiatal hernia. Otherwise, no acute cardiopulmonary process. CT Abdomen & Pelvis>>IMPRESSION: 1. High grade small bowel obstruction with obstruction favored to be in the right lower quadrant slash right pelvis . 2. Small amount of free  fluid and hazy mesentery within the distal small bowel and along the left pericardial gutter related to small bowel obstruction and venous congestion . 3. No pneumatosis or portal venous gas.no intraperitoneal free air. . 4. Very large hiatal hernia with half the stomach above the hemidiaphragm and markedly distended . Medications Administered: 2.5 L LR boluses, Zosyn , Zofran  and Morphine    She met SIRS criteria, therefore sepsis workup initiated and Antibiotics given. [facility] asked to admit for further workup and treatment.  General Surgery was consulted   Please see Significant Hospital Events section below for full detailed hospital course.   Pertinent  Medical History   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  Mastectomy/ SLN.   Chest pain    a. 10/2017 Cath: Nl cors.   Chronic headache    Chronic heart failure with preserved ejection fraction (HFpEF) (HCC)    a. 10/2021 Echo: EF 60-65%, no rwma, mild LVH, GrI DD, nl RV size/fxn, mild MR.   Complication of anesthesia    prior to 1991 used to have PONV.  none recently.   Diverticulosis of colon    Family history of adverse reaction to anesthesia    mother/daighter get sick   GERD with stricture    Hard of hearing    Hiatal hernia    Hypokalemia    Labile hypertension    a. 10/2017 Cath: nl renal arteries.   Mitral regurgitation    a. 10/2021 Echo: Mild MR.   Mixed hyperlipidemia    Neuropathy    feet   Osteoporosis    Pre-diabetes  Pulmonary hypertension (HCC)    a. 10/2017 Echo: PASP 35-4mmHg.   Wears dentures    full upper    Micro Data:  1/28: Blood cultures>>  Antimicrobials:   Anti-infectives (From admission, onward)    Start     Dose/Rate Route Frequency Ordered Stop   02/18/24 1445  piperacillin -tazobactam (ZOSYN ) IVPB 3.375 g        3.375 g 100 mL/hr over 30 Minutes Intravenous  Once 02/18/24 1439  02/18/24 1524       Significant Hospital Events: Including procedures, antibiotic start and stop dates in addition to other pertinent events   1/28: Admitted by [facility] for SBO and AKI.  General Surgery consulted.  BP soft, PCCM consulted for potential vasopressors.   Interim History / Subjective:  As outlined above under Significant Hospital Events section  Objective   Blood pressure 95/60, pulse (!) 119, temperature 98 F (36.7 C), temperature source Oral, resp. rate 20, SpO2 98%.        Intake/Output Summary (Last 24 hours) at 02/18/2024 1822 Last data filed at 02/18/2024 1818 Gross per 24 hour  Intake 543.56 ml  Output 2250 ml  Net -1706.44 ml   There were no vitals filed for this visit.  Examination: General: Acutely ill appearing elderly female, laying in bed, on room air, in NAD HENT: Atraumatic, normocephalic, neck supple, no JVD, dry MM Lungs: Clear breath sounds throughout, even, nonlabored, normal effort Cardiovascular: Tachycardia, regular rhythm, s1s2, no M/R/G Abdomen: Slightly distended, slightly tender to palpation, no guarding or rebound tenderness, BS hypoactive Extremities: 1+ edema BLE, no cyanosis, good peripheral perfusion Neuro: Sleeping post pain meds, arouses easily to voice, moves all extremities purposefully, no focal deficits, speech clear GU: deferred   Resolved Hospital Problem list     Assessment & Plan:   #Hypotension: Hypovolemic +/- developing Sepsis ~ currently fluid responsive  #HFpEF without acute exacerbation #Mildly elevated Troponin due to demand ischemia PMHx: HTN, HLD, CAD -Continuous cardiac monitoring -Maintain MAP >65 -IV fluids -Vasopressors as needed to maintain MAP goal ~ currently not requiring  -Trend lactic acid until normalized -HS Troponin peaked at 69  #Meets SIRS Criteria on presentation (WBC 18.7, RR 31, Pulse 121) #Sepsis, suspect intraabdominal source given SBO -Monitor fever curve -Trend WBC's &  Procalcitonin -Follow cultures as above -Continue empiric Zosyn  pending cultures & sensitivities  #Small Bowel Obstruction #Large Hiatal Hernia -NPO -NGT to LIS -General Surgery following, appreciate input   #Acute Kidney Injury -Monitor I&O's / urinary output -Follow BMP -Ensure adequate renal perfusion -Avoid nephrotoxic agents as able -Replace electrolytes as indicated ~ Pharmacy following for assistance with electrolyte replacement -IV fluids   #Diabetes Mellitus Type II -CBG's q4h; Target range of 140 to 180 -SSI -Follow ICU Hypo/Hyperglycemia protocol      Best Practice (right click and Reselect all SmartList Selections daily)   Diet/type: NPO DVT prophylaxis: SCD GI prophylaxis: PPI Lines: N/A Foley:  N/A Code Status:  full code Last date of multidisciplinary goals of care discussion [N/A]  1/28: Pt and her daughter and son updated at bedside on plan of care.  Labs   CBC: Recent Labs  Lab 02/18/24 1417  WBC 18.7*  HGB 13.5  HCT 43.7  MCV 85.7  PLT 235    Basic Metabolic Panel: Recent Labs  Lab 02/18/24 1417  NA 141  K 3.7  CL 99  CO2 23  GLUCOSE 143*  BUN 26*  CREATININE 1.51*  CALCIUM  10.1   GFR: CrCl cannot be calculated (  Unknown ideal weight.). Recent Labs  Lab 02/18/24 1417 02/18/24 1627  WBC 18.7*  --   LATICACIDVEN >9.0* 7.9*    Liver Function Tests: Recent Labs  Lab 02/18/24 1417  AST 64*  ALT 64*  ALKPHOS 80  BILITOT 0.6  PROT 6.3*  ALBUMIN  4.0   Recent Labs  Lab 02/18/24 1417  LIPASE 17   No results for input(s): AMMONIA in the last 168 hours.  ABG No results found for: PHART, PCO2ART, PO2ART, HCO3, TCO2, ACIDBASEDEF, O2SAT   Coagulation Profile: No results for input(s): INR, PROTIME in the last 168 hours.  Cardiac Enzymes: No results for input(s): CKTOTAL, CKMB, CKMBINDEX, TROPONINI in the last 168 hours.  HbA1C: Hemoglobin A1C  Date/Time Value Ref Range Status   09/16/2023 08:37 AM 5.6 4.0 - 5.6 % Final   Hgb A1c MFr Bld  Date/Time Value Ref Range Status  02/18/2023 08:26 AM 6.0 4.6 - 6.5 % Final    Comment:    Glycemic Control Guidelines for People with Diabetes:Non Diabetic:  <6%Goal of Therapy: <7%Additional Action Suggested:  >8%   02/12/2022 07:36 AM 6.2 4.6 - 6.5 % Final    Comment:    Glycemic Control Guidelines for People with Diabetes:Non Diabetic:  <6%Goal of Therapy: <7%Additional Action Suggested:  >8%     CBG: No results for input(s): GLUCAP in the last 168 hours.  Review of Systems:   Positives in BOLD: Gen: Denies fever, chills, weight change, fatigue, night sweats HEENT: Denies blurred vision, double vision, hearing loss, tinnitus, sinus congestion, rhinorrhea, sore throat, neck stiffness, dysphagia PULM: Denies shortness of breath, cough, sputum production, hemoptysis, wheezing CV: Denies chest pain, edema, orthopnea, paroxysmal nocturnal dyspnea, palpitations GI: Denies abdominal pain, nausea, vomiting, diarrhea, hematochezia, melena, constipation, change in bowel habits GU: Denies dysuria, hematuria, polyuria, oliguria, urethral discharge Endocrine: Denies hot or cold intolerance, polyuria, polyphagia or appetite change Derm: Denies rash, dry skin, scaling or peeling skin change Heme: Denies easy bruising, bleeding, bleeding gums Neuro: Denies headache, numbness, weakness, slurred speech, loss of memory or consciousness   Past Medical History:  She,  has a past medical history of Anemia, Arthritis, Breast cancer (HCC) (06/13/2017), Chest pain, Chronic headache, Chronic heart failure with preserved ejection fraction (HFpEF) (HCC), Complication of anesthesia, Diverticulosis of colon, Family history of adverse reaction to anesthesia, GERD with stricture, Hard of hearing, Hiatal hernia, Hypokalemia, Labile hypertension, Mitral regurgitation, Mixed hyperlipidemia, Neuropathy, Osteoporosis, Pre-diabetes, Pulmonary hypertension  (HCC), and Wears dentures.   Surgical History:   Past Surgical History:  Procedure Laterality Date   ABDOMINAL HYSTERECTOMY  1978   one ovary remains   BREAST BIOPSY Left 05/26/2017   Affirm Bx- coil clip Ductal carcinoma in situ, high nuclear grade with calcifications and focal comedonecrosis   CARDIAC CATHETERIZATION     yrs ago all OK.   CATARACT EXTRACTION W/PHACO Left 02/21/2016   Procedure: CATARACT EXTRACTION PHACO AND INTRAOCULAR LENS PLACEMENT (IOC)  Left eye;  Surgeon: Dene Etienne, MD;  Location: [facility];  Service: Ophthalmology;  Laterality: Left;  Left eye Diabetic   CATARACT EXTRACTION W/PHACO Right 03/13/2016   Procedure: CATARACT EXTRACTION PHACO AND INTRAOCULAR LENS PLACEMENT (IOC)  right  diabetic;  Surgeon: Dene Etienne, MD;  Location: [facility];  Service: Ophthalmology;  Laterality: Right;  diabetic   CHOLECYSTECTOMY  1978   COLONOSCOPY N/A 11/05/2023   Procedure: COLONOSCOPY;  Surgeon: Unk Corinn Skiff, MD;  Location: [facility] END[facility];  Service: Gastroenterology;  Laterality: N/A;   COLONOSCOPY W/ POLYPECTOMY  09/09/2013   8 mm tubular adenoma of the cecum.  Lamar Aho, MD. [facility]   COLONOSCOPY WITH PROPOFOL  N/A 06/04/2019   Procedure: COLONOSCOPY WITH PROPOFOL ;  Surgeon: Jinny Carmine, MD;  Location: [facility] ENDOSCOPY;  Service: Endoscopy;  Laterality: N/A;   ESOPHAGOGASTRODUODENOSCOPY N/A 11/05/2023   Procedure: EGD (ESOPHAGOGASTRODUODENOSCOPY);  Surgeon: Unk Corinn Skiff, MD;  Location: [facility] END[facility];  Service: Gastroenterology;  Laterality: N/A;   ESOPHAGOGASTRODUODENOSCOPY (EGD) WITH PROPOFOL  N/A 06/04/2019   Procedure: ESOPHAGOGASTRODUODENOSCOPY (EGD) WITH PROPOFOL ;  Surgeon: Jinny Carmine, MD;  Location: [facility] ENDOSCOPY;  Service: Endoscopy;  Laterality: N/A;   JOINT REPLACEMENT Left    knee    LEFT HEART CATH AND CORONARY ANGIOGRAPHY N/A 11/06/2017   Procedure: LEFT HEART CATH AND CORONARY ANGIOGRAPHY;  Surgeon:  Mady Bruckner, MD;  Location: [facility] INVASIVE CV LAB;  Service: Cardiovascular;  Laterality: N/A;   MASTECTOMY Left 06/13/2017   mastectomy neg margins   OOPHORECTOMY     one still left   RENAL ANGIOGRAPHY  11/06/2017   Procedure: RENAL ANGIOGRAPHY;  Surgeon: Mady Bruckner, MD;  Location: [facility] INVAS[facility] CV LAB;  Service: Cardiovascular;;   SENTINEL NODE BIOPSY Left 06/13/2017   Procedure: SENTINEL NODE BIOPSY;  Surgeon: Dessa Reyes ORN, MD;  Location: [facility] ORS;  Service: General;  Laterality: Left;   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  Surgeon: Dessa Reyes ORN, MD;  Location: [facility] ORS;  Service: General;  Laterality: Left;     Social History:   reports that she has never smoked. She has never used smokeless tobacco. She reports that she does not drink alcohol and does not use drugs.   Family History:  Her family history includes Breast cancer in an other family member; Breast cancer (age of onset: 63) in her sister; Coronary artery disease (age of onset: 41) in her father; Diabetes in an other family member; Esophageal cancer in her brother; Heart attack (age of onset: 10) in her mother; Heart disease in an other family member; Hypertension in her father and mother; Stomach cancer in an other family member; Stroke in her father.   Allergies Allergies[1]   Home Medications  Prior to Admission medications  Medication Sig Start Date End Date Taking? Authorizing Provider  amLODipine  (NORVASC ) 5 MG tablet Take 1 tablet (5 mg total) by mouth daily. 01/28/24  Yes Bedsole, Amy E, MD  atorvastatin  (LIPITOR) 40 MG tablet Take 1 tablet (40 mg total) by mouth at bedtime. 01/23/24  Yes Bedsole, Amy E, MD  brimonidine  (ALPHAGAN ) 0.2 % ophthalmic solution brimonidine  0.2 % eye drops  INSTILL 1 DROP INTO BOTH EYES TWICE A DAY   Yes [provider]  busPIRone  (BUSPAR ) 5 MG tablet Take 1 tablet (5 mg total) by  mouth 2 (two) times daily. 01/23/24  Yes Bedsole, Amy E, MD  Calcium  Carb-Cholecalciferol  (CALCIUM  600 + D) 600-5 MG-MCG TABS Take 2 tablets by mouth daily. 09/12/21  Yes Babara Call, MD  diclofenac  (VOLTAREN ) 75 MG EC tablet Take 1 tablet (75 mg total) by mouth 2 (two) times daily. 01/23/24  Yes Bedsole, Amy E, MD  diclofenac  Sodium (VOLTAREN ) 1 % GEL APPLY 2 GRAMS TOPICALLY TO THE AFFECTED AREA 4 TIMES DAILY. 06/25/21  Yes Bedsole, Amy E, MD  esomeprazole  (NEXIUM ) 40 MG capsule TAKE 1 CAPSULE TWICE DAILY BEFORE MEALS 01/23/24  Yes Bedsole, Amy E, MD  fluticasone  (FLONASE ) 50 MCG/ACT nasal spray USE 2 SPRAYS INTO THE NOSE DAILY AS  DIRECTED (SUBSTITUTED FOR FLONASE ) 01/23/24  Yes Bedsole, Amy E, MD  furosemide  (LASIX ) 20 MG tablet TAKE 1 TABLET BY MOUTH EVERY DAY AS NEEDED 01/15/18  Yes Bedsole, Amy E, MD  gabapentin  (NEURONTIN ) 100 MG capsule TAKE 1 CAPSULE EVERY MORNING , 1 CAPSULE AT LUNCH, 1 CAPSULE AT DINNER, AND 2-3 CAPSULES AT BEDTIME 01/28/24  Yes Bedsole, Amy E, MD  Glucosamine 500 MG TABS Take 500 mg by mouth daily.   Yes [provider]  isosorbide  mononitrate (IMDUR ) 30 MG 24 hr tablet Take 1 tablet (30 mg total) by mouth daily. 01/23/24  Yes Bedsole, Amy E, MD  latanoprost  (XALATAN ) 0.005 % ophthalmic solution Place 1 drop into both eyes at bedtime.   Yes [provider]  lidocaine  (LIDODERM ) 5 % APPLY 1 PATCH ONTO THE SKIN EVERY DAY. REMOVE AND DISCARD PATCH WITHIN 12 HOURS OR AS DIRECTED BY PHYSICIAN. 01/23/24  Yes Bedsole, Amy E, MD  mirabegron  ER (MYRBETRIQ ) 25 MG TB24 tablet Take 1 tablet (25 mg total) by mouth daily. 01/23/24  Yes Bedsole, Amy E, MD  Omega-3 Fatty Acids (FISH OIL) 1000 MG CAPS Take 1,000 mg by mouth daily.    Yes [provider]  polycarbophil (FIBERCON) 625 MG tablet Take 625 mg by mouth daily.   Yes [provider]  potassium chloride  SA (KLOR-CON  M) 20 MEQ tablet Take 1 tablet (20 mEq total) by mouth 2 (two) times daily. 01/28/24  Yes Bedsole, Amy E,  MD  traMADol  (ULTRAM ) 50 MG tablet Take 1 tablet (50 mg total) by mouth every 6 (six) hours as needed. for pain 01/28/24  Yes Bedsole, Amy E, MD  Alcohol Swabs (DROPSAFE ALCOHOL PREP) 70 % PADS USE  TO TEST BLOOD SUGAR ONE TIME DAILY 06/25/21   Avelina, Amy E, MD  Blood Glucose Calibration (ACCU-CHEK AVIVA) SOLN Use to check blood sugar once daily 10/27/18   Bedsole, Amy E, MD  Blood Glucose Calibration (TRUE METRIX LEVEL 1) Low SOLN USE AS DIRECTED 06/12/20   Bedsole, Amy E, MD  Blood Glucose Monitoring Suppl (TRUE METRIX AIR GLUCOSE METER) w/Device KIT Use to check blood sugar daily 04/30/21   Bedsole, Amy E, MD  glucose blood (TRUE METRIX BLOOD GLUCOSE TEST) test strip Use to check blood sugar daily 04/30/21   Bedsole, Amy E, MD  Iron -Vitamin C  65-125 MG TABS Take 1 tablet by mouth daily at 2 PM. Patient not taking: Reported on 02/18/2024 03/15/22   Babara Call, MD  losartan -hydrochlorothiazide  (HYZAAR) 100-25 MG tablet Take 1 tablet by mouth daily. Patient not taking: Reported on 02/18/2024    [provider]  triamcinolone  cream (KENALOG ) 0.5 % Apply 1 application topically 2 (two) times daily. Patient not taking: Reported on 02/18/2024 08/12/19   Avelina Greig BRAVO, MD  TRUEplus Lancets 28G MISC TEST BLOOD SUGAR EVERY DAY 06/28/21   Avelina Greig BRAVO, MD     Care time: 50 minutes     Inge Lecher, AGACNP-BC [facility] Prefer epic messenger for cross cover needs If after hours, please call E-link       [1]  Allergies Allergen Reactions   Oxycodone  Nausea Only   Sulfa Antibiotics Rash    Childhood reaction   Sulfonamide Derivatives Rash    Childhood reaction.   "

## 2024-02-18 NOTE — Consult Note (Signed)
 Patient ID: Ashley Ryan, female   DOB: 1946-01-20, 79 y.o.   MRN: [id number] CC: SBO History of Present Illness Ashley Ryan is a 79 y.o. female with past medical history significant for congestive heart failure, hypertension who presents in consultation for small bowel obstruction.  The history is obtained from the patient as well as the patient's son.  They report that she was doing okay yesterday but then started to develop abdominal pain.  The pain is described as throughout her abdomen associated with emesis.  She is unsure when she last passed gas or had a bowel movement although it may have been 4 to 5 days.  She reports feeling distended.  She says that once the NG tube was placed she started to feel little bit better.  Her past surgical history is significant for abdominal hysterectomy as well as a cholecystectomy.  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  Mastectomy/ SLN.   Chest pain    a. 10/2017 Cath: Nl cors.   Chronic headache    Chronic heart failure with preserved ejection fraction (HFpEF) (HCC)    a. 10/2021 Echo: EF 60-65%, no rwma, mild LVH, GrI DD, nl RV size/fxn, mild MR.   Complication of anesthesia    prior to 1991 used to have PONV.  none recently.   Diverticulosis of colon    Family history of adverse reaction to anesthesia    mother/daighter get sick   GERD with stricture    Hard of hearing    Hiatal hernia    Hypokalemia    Labile hypertension    a. 10/2017 Cath: nl renal arteries.   Mitral regurgitation    a. 10/2021 Echo: Mild MR.   Mixed hyperlipidemia    Neuropathy    feet   Osteoporosis    Pre-diabetes    Pulmonary hypertension (HCC)    a. 10/2017 Echo: PASP 35-6mmHg.   Wears dentures    full upper       Past Surgical History:  Procedure Laterality Date   ABDOMINAL HYSTERECTOMY  1978   one ovary remains   BREAST  BIOPSY Left 05/26/2017   Affirm Bx- coil clip Ductal carcinoma in situ, high nuclear grade with calcifications and focal comedonecrosis   CARDIAC CATHETERIZATION     yrs ago all OK.   CATARACT EXTRACTION W/PHACO Left 02/21/2016   Procedure: CATARACT EXTRACTION PHACO AND INTRAOCULAR LENS PLACEMENT (IOC)  Left eye;  Surgeon: Dene Etienne, MD;  Location: [facility];  Service: Ophthalmology;  Laterality: Left;  Left eye Diabetic   CATARACT EXTRACTION W/PHACO Right 03/13/2016   Procedure: CATARACT EXTRACTION PHACO AND INTRAOCULAR LENS PLACEMENT (IOC)  right  diabetic;  Surgeon: Dene Etienne, MD;  Location: [facility];  Service: Ophthalmology;  Laterality: Right;  diabetic   CHOLECYSTECTOMY  1978   COLONOSCOPY N/A 11/05/2023   Procedure: COLONOSCOPY;  Surgeon: Unk Corinn Skiff, MD;  Location: [facility] ENDOSCOPY;  Service: Gastroenterology;  Laterality: N/A;   COLONOSCOPY W/ POLYPECTOMY  09/09/2013   8 mm tubular adenoma of the cecum.  Lamar Aho, MD. [facility]   COLONOSCOPY WITH PROPOFOL  N/A 06/04/2019   Procedure: COLONOSCOPY WITH PROPOFOL ;  Surgeon: Jinny Carmine, MD;  Location: [facility] ENDOSCOPY;  Service: Endoscopy;  Laterality: N/A;   ESOPHAGOGASTRODUODENOSCOPY N/A 11/05/2023   Procedure: EGD (ESOPHAGOGASTRODUODENOSCOPY);  Surgeon: Unk Corinn Skiff, MD;  Location: [facility] ENDOSCOPY;  Service: Gastroenterology;  Laterality: N/A;   ESOPHAGOGASTRODUODENOSCOPY (EGD) WITH PROPOFOL  N/A 06/04/2019   Procedure: ESOPHAGOGASTRODUODENOSCOPY (EGD) WITH PROPOFOL ;  Surgeon: Jinny Carmine, MD;  Location: [facility] ENDOSCOPY;  Service: Endoscopy;  Laterality: N/A;   JOINT REPLACEMENT Left    knee    LEFT HEART CATH AND CORONARY ANGIOGRAPHY N/A 11/06/2017   Procedure: LEFT HEART CATH AND CORONARY ANGIOGRAPHY;  Surgeon: Mady Bruckner, MD;  Location: [facility] INVASIVE CV LAB;  Service: Cardiovascular;  Laterality: N/A;   MASTECTOMY Left 06/13/2017   mastectomy neg margins    OOPHORECTOMY     one still left   RENAL ANGIOGRAPHY  11/06/2017   Procedure: RENAL ANGIOGRAPHY;  Surgeon: Mady Bruckner, MD;  Location: [facility] INVAS[facility] CV LAB;  Service: Cardiovascular;;   SENTINEL NODE BIOPSY Left 06/13/2017   Procedure: SENTINEL NODE BIOPSY;  Surgeon: Dessa Reyes ORN, MD;  Location: [facility] ORS;  Service: General;  Laterality: Left;   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  Surgeon: Dessa Reyes ORN, MD;  Location: [facility] ORS;  Service: General;  Laterality: Left;    Allergies[1]  Current Facility-Administered Medications  Medication Dose Route Frequency Provider Last Rate Last Admin   lactated ringers  bolus 1,000 mL  1,000 mL Intravenous Once Paduchowski, Kevin, MD 999 mL/hr at 02/18/24 1611 1,000 mL at 02/18/24 1611   Current Outpatient Medications  Medication Sig Dispense Refill   Alcohol Swabs (DROPSAFE ALCOHOL PREP) 70 % PADS USE  TO TEST BLOOD SUGAR ONE TIME DAILY 100 each 3   amLODipine  (NORVASC ) 5 MG tablet Take 1 tablet (5 mg total) by mouth daily. 90 tablet 0   atorvastatin  (LIPITOR) 40 MG tablet Take 1 tablet (40 mg total) by mouth at bedtime. 90 tablet 0   Blood Glucose Calibration (ACCU-CHEK AVIVA) SOLN Use to check blood sugar once daily 1 each 0   Blood Glucose Calibration (TRUE METRIX LEVEL 1) Low SOLN USE AS DIRECTED 1 each 3   Blood Glucose Monitoring Suppl (TRUE METRIX AIR GLUCOSE METER) w/Device KIT Use to check blood sugar daily 1 kit 0   brimonidine  (ALPHAGAN ) 0.2 % ophthalmic solution brimonidine  0.2 % eye drops  INSTILL 1 DROP INTO BOTH EYES TWICE A DAY     busPIRone  (BUSPAR ) 5 MG tablet Take 1 tablet (5 mg total) by mouth 2 (two) times daily. 180 tablet 0   Calcium  Carb-Cholecalciferol  (CALCIUM  600 + D) 600-5 MG-MCG TABS Take 2 tablets by mouth daily. 180 tablet 2   diclofenac  (VOLTAREN ) 75 MG EC tablet Take 1 tablet (75 mg total) by mouth 2 (two) times daily. 180 tablet  0   diclofenac  Sodium (VOLTAREN ) 1 % GEL APPLY 2 GRAMS TOPICALLY TO THE AFFECTED AREA 4 TIMES DAILY. 700 g 11   esomeprazole  (NEXIUM ) 40 MG capsule TAKE 1 CAPSULE TWICE DAILY BEFORE MEALS 180 capsule 0   fluticasone  (FLONASE ) 50 MCG/ACT nasal spray USE 2 SPRAYS INTO THE NOSE DAILY AS DIRECTED (SUBSTITUTED FOR FLONASE ) 48 g 0   furosemide  (LASIX ) 20 MG tablet TAKE 1 TABLET BY MOUTH EVERY DAY AS NEEDED 30 tablet 5   gabapentin  (NEURONTIN ) 100 MG capsule TAKE 1 CAPSULE EVERY MORNING , 1 CAPSULE AT LUNCH, 1 CAPSULE AT DINNER, AND 2-3 CAPSULES AT BEDTIME 540 capsule 1   Glucosamine 500 MG TABS Take 500 mg by mouth daily.     glucose blood (TRUE METRIX BLOOD GLUCOSE TEST) test strip Use to check blood sugar daily  100 each 3   Iron -Vitamin C  65-125 MG TABS Take 1 tablet by mouth daily at 2 PM. 30 tablet 5   isosorbide  mononitrate (IMDUR ) 30 MG 24 hr tablet Take 1 tablet (30 mg total) by mouth daily. 90 tablet 0   latanoprost  (XALATAN ) 0.005 % ophthalmic solution Place 1 drop into both eyes at bedtime.     lidocaine  (LIDODERM ) 5 % APPLY 1 PATCH ONTO THE SKIN EVERY DAY. REMOVE AND DISCARD PATCH WITHIN 12 HOURS OR AS DIRECTED BY PHYSICIAN. 90 patch 0   losartan -hydrochlorothiazide  (HYZAAR) 100-25 MG tablet Take 1 tablet by mouth daily.     mirabegron  ER (MYRBETRIQ ) 25 MG TB24 tablet Take 1 tablet (25 mg total) by mouth daily. 90 tablet 0   Omega-3 Fatty Acids (FISH OIL) 1000 MG CAPS Take 1,000 mg by mouth daily.      polycarbophil (FIBERCON) 625 MG tablet Take 625 mg by mouth daily.     potassium chloride  SA (KLOR-CON  M) 20 MEQ tablet Take 1 tablet (20 mEq total) by mouth 2 (two) times daily. 180 tablet 0   traMADol  (ULTRAM ) 50 MG tablet Take 1 tablet (50 mg total) by mouth every 6 (six) hours as needed. for pain 90 tablet 0   triamcinolone  cream (KENALOG ) 0.5 % Apply 1 application topically 2 (two) times daily. 30 g 0   TRUEplus Lancets 28G MISC TEST BLOOD SUGAR EVERY DAY 100 each 3    Facility-Administered Medications Ordered in Other Encounters  Medication Dose Route Frequency Provider Last Rate Last Admin   cyanocobalamin  (VITAMIN B12) injection 1,000 mcg  1,000 mcg Intramuscular Once Babara Call, MD        Family History Family History  Problem Relation Age of Onset   Breast cancer Other    Stomach cancer Other    Diabetes Other    Heart disease Other    Breast cancer Sister 71   Esophageal cancer Brother    Hypertension Mother    Heart attack Mother 58   Hypertension Father    Coronary artery disease Father 48       CABG   Stroke Father        Social History Social History[2]  Accompanied by her granddaughter and son   ROS Full ROS of systems performed and is otherwise negative there than what is stated in the HPI  Physical Exam Blood pressure 113/70, pulse (!) 119, temperature 97.8 F (36.6 C), temperature source Oral, resp. rate 18, SpO2 96%.  She is able to answer some questions although seems a little bit sleepy and son says that this is not her baseline, normal work of breathing on room air, sinus tachycardia, NG tube in place with gastro bilious output and there is been 2 L out since it was put in.  Abdomen is soft, distended, she does have some pain in the right lower quadrant without any rebound tenderness or guarding, no signs of peritonitis Data Reviewed Labs reviewed and she does have a leukocytosis of 18,000.  Her lactic acidosis is above 9 but I think this is mostly secondary to dehydration.  She has an AKI with a greater than doubling of her baseline creatinine.  I personally reviewed her CT scan.  She has a large hiatal hernia that is fluid-filled with air-fluid levels.  Her proximal small bowel is dilated with air-fluid levels.  The dilation continues down into the right lower quadrant.  There does seem to be a transition point likely in the mid ileum as her  terminal ileum is decompressed.  There is some inflammation along the mesentery  with some free fluid in the abdomen but there is no pneumatosis.  There is no free air.  I do not see any mesenteric swirling.  I have personally reviewed the patient's imaging and medical records.    Assessment    79 year old with 1 day history of abdominal pain associated with nausea and vomiting.  CT scan concerning for high-grade bowel obstruction.  KUB reviewed and her NG tube is in the herniated stomach.  Plan  Patient without peritonitis and no CT findings concerning for pneumatosis or perforation.  I think her lactic acidosis is secondary to both her bowel obstruction as well as the sequela of this with the dehydration.  Recommend fluid resuscitation and repeat labs 6 to 8 hours after initial labs.  Her NG tube appears to be in the herniated stomach but when I was in the room it was working and decompressing so I would continue it as it is.  Recommend getting a KUB in the morning.  She is tachycardic but is not hypotensive.  We will follow her vital signs and exam overnight.  I discussed with her and her family that I have a low threshold for proceeding to the operating room given the degree of bowel obstruction but at this time there is no surgical emergency.  A total of 65 minutes was spent reviewing the patient's chart, performing history and physical and discussing treatment options with the patient    Jayson MALVA Endow 02/18/2024, 5:02 PM     [1]  Allergies Allergen Reactions   Oxycodone  Nausea Only   Sulfa Antibiotics Rash    Childhood reaction   Sulfonamide Derivatives Rash    Childhood reaction.  [2]  Social History Tobacco Use   Smoking status: Never   Smokeless tobacco: Never  Vaping Use   Vaping status: Never Used  Substance Use Topics   Alcohol use: No   Drug use: No

## 2024-02-18 NOTE — Telephone Encounter (Signed)
" °  FYI Only or Action Required?: FYI only for provider: 911 advised.  Patient was last seen in primary care on 01/28/2024 by Watt Mirza, MD.  Called Nurse Triage reporting Neurologic Problem.  Symptoms began today.  Interventions attempted: Nothing.  Symptoms are: rapidly worsening.  Triage Disposition: Call EMS 911 Now  Patient/caregiver understands and will follow disposition?:   Reason for Disposition  [1] SEVERE weakness (e.g., unable to walk or barely able to walk, requires support) AND [2] new-onset or getting worse  Answer Assessment - Initial Assessment Questions Son states pt having difficulty sitting up and cannot speak.  Triage stopped and instructed son to call 911.   1. SYMPTOM: What is the main symptom you are concerned about? (e.g., weakness, numbness)     Not able to speak or walk 2. ONSET: When did this start? (e.g., minutes, hours, days; while sleeping)     This am  Protocols used: Neurologic Deficit-A-AH  "

## 2024-02-18 NOTE — ED Provider Notes (Signed)
----------------------------------------- °  4:19 PM on 02/18/2024 ----------------------------------------- Patient care assumed from Dr. Claudene.  Patient CT scan appears to show significant bowel obstruction on my evaluation of the images.  We have placed an NG tube.  The image appears of the NG tube in the chest however this is appears to be in her large hiatal hernia has good gastric content output when on low intermittent suction.  Patient is on broad-spectrum antibiotics given her leukocytosis tachycardia and lactic acidosis.  I spoke to Dr. Marinda of general surgery given my preliminary evaluation of the CT images.  He will evaluate.  We will continue to closely monitor in the emergency department.  I ordered a second liter of IV fluids and repeat lactic acid.  Patient has been seen by Dr. Marinda of general surgery.  They would like the patient admitted medically for surgical optimization continued fluids and antibiotics.  They will continue to closely monitor the patient to decide if and when surgery may be necessary.  Patient is already put out over 2 L from her NG tube.   Dorothyann Drivers, MD 02/18/24 [phone]

## 2024-02-19 ENCOUNTER — Inpatient Hospital Stay

## 2024-02-19 ENCOUNTER — Encounter: Admission: EM | Payer: Self-pay | Source: Home / Self Care | Attending: Pulmonary Disease

## 2024-02-19 ENCOUNTER — Inpatient Hospital Stay: Admitting: Anesthesiology

## 2024-02-19 ENCOUNTER — Inpatient Hospital Stay: Admit: 2024-02-19

## 2024-02-19 DIAGNOSIS — K55069 Acute infarction of intestine, part and extent unspecified: Secondary | ICD-10-CM

## 2024-02-19 DIAGNOSIS — K469 Unspecified abdominal hernia without obstruction or gangrene: Secondary | ICD-10-CM | POA: Diagnosis not present

## 2024-02-19 DIAGNOSIS — E875 Hyperkalemia: Secondary | ICD-10-CM | POA: Diagnosis not present

## 2024-02-19 DIAGNOSIS — A419 Sepsis, unspecified organism: Secondary | ICD-10-CM | POA: Diagnosis not present

## 2024-02-19 DIAGNOSIS — E8721 Acute metabolic acidosis: Secondary | ICD-10-CM | POA: Diagnosis not present

## 2024-02-19 DIAGNOSIS — R6521 Severe sepsis with septic shock: Secondary | ICD-10-CM

## 2024-02-19 DIAGNOSIS — I5032 Chronic diastolic (congestive) heart failure: Secondary | ICD-10-CM

## 2024-02-19 DIAGNOSIS — K7200 Acute and subacute hepatic failure without coma: Secondary | ICD-10-CM

## 2024-02-19 DIAGNOSIS — R7401 Elevation of levels of liver transaminase levels: Secondary | ICD-10-CM | POA: Diagnosis not present

## 2024-02-19 DIAGNOSIS — K56609 Unspecified intestinal obstruction, unspecified as to partial versus complete obstruction: Secondary | ICD-10-CM | POA: Diagnosis not present

## 2024-02-19 DIAGNOSIS — G9341 Metabolic encephalopathy: Secondary | ICD-10-CM | POA: Diagnosis not present

## 2024-02-19 DIAGNOSIS — I340 Nonrheumatic mitral (valve) insufficiency: Secondary | ICD-10-CM

## 2024-02-19 DIAGNOSIS — N179 Acute kidney failure, unspecified: Secondary | ICD-10-CM | POA: Diagnosis not present

## 2024-02-19 DIAGNOSIS — E872 Acidosis, unspecified: Secondary | ICD-10-CM

## 2024-02-19 LAB — FOLATE: Folate: 16.1 ng/mL

## 2024-02-19 LAB — BASIC METABOLIC PANEL WITH GFR
Anion gap: 20 — ABNORMAL HIGH (ref 5–15)
Anion gap: 13 (ref 5–15)
BUN: 37 mg/dL — ABNORMAL HIGH (ref 8–23)
BUN: 41 mg/dL — ABNORMAL HIGH (ref 8–23)
CO2: 18 mmol/L — ABNORMAL LOW (ref 22–32)
CO2: 25 mmol/L (ref 22–32)
Calcium: 8.8 mg/dL — ABNORMAL LOW (ref 8.9–10.3)
Calcium: 7.9 mg/dL — ABNORMAL LOW (ref 8.9–10.3)
Chloride: 99 mmol/L (ref 98–111)
Chloride: 100 mmol/L (ref 98–111)
Creatinine, Ser: 1.77 mg/dL — ABNORMAL HIGH (ref 0.44–1.00)
Creatinine, Ser: 1.6 mg/dL — ABNORMAL HIGH (ref 0.44–1.00)
GFR, Estimated: 29 mL/min — ABNORMAL LOW
GFR, Estimated: 32 mL/min — ABNORMAL LOW
Glucose, Bld: 133 mg/dL — ABNORMAL HIGH (ref 70–99)
Glucose, Bld: 104 mg/dL — ABNORMAL HIGH (ref 70–99)
Potassium: 5.4 mmol/L — ABNORMAL HIGH (ref 3.5–5.1)
Potassium: 4 mmol/L (ref 3.5–5.1)
Sodium: 137 mmol/L (ref 135–145)
Sodium: 137 mmol/L (ref 135–145)

## 2024-02-19 LAB — CBC
HCT: 41 % (ref 36.0–46.0)
HCT: 34.8 % — ABNORMAL LOW (ref 36.0–46.0)
Hemoglobin: 12.7 g/dL (ref 12.0–15.0)
Hemoglobin: 11.3 g/dL — ABNORMAL LOW (ref 12.0–15.0)
MCH: 26.2 pg (ref 26.0–34.0)
MCH: 26.8 pg (ref 26.0–34.0)
MCHC: 31 g/dL (ref 30.0–36.0)
MCHC: 32.5 g/dL (ref 30.0–36.0)
MCV: 84.7 fL (ref 80.0–100.0)
MCV: 82.5 fL (ref 80.0–100.0)
Platelets: 203 10*3/uL (ref 150–400)
Platelets: 204 10*3/uL (ref 150–400)
RBC: 4.84 MIL/uL (ref 3.87–5.11)
RBC: 4.22 MIL/uL (ref 3.87–5.11)
RDW: 18.5 % — ABNORMAL HIGH (ref 11.5–15.5)
RDW: 18.3 % — ABNORMAL HIGH (ref 11.5–15.5)
WBC: 16.9 10*3/uL — ABNORMAL HIGH (ref 4.0–10.5)
WBC: 9.2 10*3/uL (ref 4.0–10.5)
nRBC: 0.1 % (ref 0.0–0.2)
nRBC: 0 % (ref 0.0–0.2)

## 2024-02-19 LAB — LACTIC ACID, PLASMA
Lactic Acid, Venous: 7.3 mmol/L (ref 0.5–1.9)
Lactic Acid, Venous: 5.7 mmol/L (ref 0.5–1.9)
Lactic Acid, Venous: 7.2 mmol/L (ref 0.5–1.9)
Lactic Acid, Venous: 8.6 mmol/L (ref 0.5–1.9)
Lactic Acid, Venous: >9 mmol/L (ref 0.5–1.9)
Lactic Acid, Venous: 3.6 mmol/L (ref 0.5–1.9)

## 2024-02-19 LAB — COMPREHENSIVE METABOLIC PANEL WITH GFR
ALT: 1009 U/L — ABNORMAL HIGH (ref 0–44)
ALT: 1070 U/L — ABNORMAL HIGH (ref 0–44)
AST: 1172 U/L — ABNORMAL HIGH (ref 15–41)
AST: 1244 U/L — ABNORMAL HIGH (ref 15–41)
Albumin: 3.1 g/dL — ABNORMAL LOW (ref 3.5–5.0)
Albumin: 3.4 g/dL — ABNORMAL LOW (ref 3.5–5.0)
Alkaline Phosphatase: 53 U/L (ref 38–126)
Alkaline Phosphatase: 54 U/L (ref 38–126)
Anion gap: 14 (ref 5–15)
Anion gap: 13 (ref 5–15)
BUN: 40 mg/dL — ABNORMAL HIGH (ref 8–23)
BUN: 41 mg/dL — ABNORMAL HIGH (ref 8–23)
CO2: 25 mmol/L (ref 22–32)
CO2: 25 mmol/L (ref 22–32)
Calcium: 8.6 mg/dL — ABNORMAL LOW (ref 8.9–10.3)
Calcium: 7.9 mg/dL — ABNORMAL LOW (ref 8.9–10.3)
Chloride: 99 mmol/L (ref 98–111)
Chloride: 99 mmol/L (ref 98–111)
Creatinine, Ser: 1.61 mg/dL — ABNORMAL HIGH (ref 0.44–1.00)
Creatinine, Ser: 1.6 mg/dL — ABNORMAL HIGH (ref 0.44–1.00)
GFR, Estimated: 32 mL/min — ABNORMAL LOW
GFR, Estimated: 32 mL/min — ABNORMAL LOW
Glucose, Bld: 117 mg/dL — ABNORMAL HIGH (ref 70–99)
Glucose, Bld: 105 mg/dL — ABNORMAL HIGH (ref 70–99)
Potassium: 4.9 mmol/L (ref 3.5–5.1)
Potassium: 4 mmol/L (ref 3.5–5.1)
Sodium: 138 mmol/L (ref 135–145)
Sodium: 137 mmol/L (ref 135–145)
Total Bilirubin: 1 mg/dL (ref 0.0–1.2)
Total Bilirubin: 1.2 mg/dL (ref 0.0–1.2)
Total Protein: 4.7 g/dL — ABNORMAL LOW (ref 6.5–8.1)
Total Protein: 4.4 g/dL — ABNORMAL LOW (ref 6.5–8.1)

## 2024-02-19 LAB — MRSA NEXT GEN BY PCR, NASAL: MRSA by PCR Next Gen: DETECTED — AB

## 2024-02-19 LAB — CBG MONITORING, ED
Glucose-Capillary: 55 mg/dL — ABNORMAL LOW (ref 70–99)
Glucose-Capillary: 60 mg/dL — ABNORMAL LOW (ref 70–99)
Glucose-Capillary: 72 mg/dL (ref 70–99)
Glucose-Capillary: 73 mg/dL (ref 70–99)
Glucose-Capillary: 74 mg/dL (ref 70–99)
Glucose-Capillary: 78 mg/dL (ref 70–99)

## 2024-02-19 LAB — BLOOD GAS, ARTERIAL
Acid-Base Excess: 2 mmol/L (ref 0.0–2.0)
Acid-Base Excess: 2.4 mmol/L — ABNORMAL HIGH (ref 0.0–2.0)
Acid-base deficit: 0.4 mmol/L (ref 0.0–2.0)
Bicarbonate: 24.1 mmol/L (ref 20.0–28.0)
Bicarbonate: 25.6 mmol/L (ref 20.0–28.0)
Bicarbonate: 27.2 mmol/L (ref 20.0–28.0)
FIO2: 45 %
FIO2: 40 %
MECHVT: 400 mL
MECHVT: 400 mL
Mechanical Rate: 18
Mechanical Rate: 16
O2 Saturation: 99.3 %
O2 Saturation: 99.9 %
O2 Saturation: 100 %
PEEP: 8 cmH2O
PEEP: 5 cmH2O
Patient temperature: 37
Patient temperature: 37
Patient temperature: 37
pCO2 arterial: 38 mmHg (ref 32–48)
pCO2 arterial: 36 mmHg (ref 32–48)
pCO2 arterial: 42 mmHg (ref 32–48)
pH, Arterial: 7.41 (ref 7.35–7.45)
pH, Arterial: 7.46 — ABNORMAL HIGH (ref 7.35–7.45)
pH, Arterial: 7.42 (ref 7.35–7.45)
pO2, Arterial: 110 mmHg — ABNORMAL HIGH (ref 83–108)
pO2, Arterial: 141 mmHg — ABNORMAL HIGH (ref 83–108)
pO2, Arterial: 154 mmHg — ABNORMAL HIGH (ref 83–108)

## 2024-02-19 LAB — GLUCOSE, CAPILLARY
Glucose-Capillary: 63 mg/dL — ABNORMAL LOW (ref 70–99)
Glucose-Capillary: 140 mg/dL — ABNORMAL HIGH (ref 70–99)
Glucose-Capillary: 141 mg/dL — ABNORMAL HIGH (ref 70–99)
Glucose-Capillary: 112 mg/dL — ABNORMAL HIGH (ref 70–99)
Glucose-Capillary: 87 mg/dL (ref 70–99)

## 2024-02-19 LAB — HEMOGLOBIN AND HEMATOCRIT, BLOOD
HCT: 26.6 % — ABNORMAL LOW (ref 36.0–46.0)
Hemoglobin: 8.6 g/dL — ABNORMAL LOW (ref 12.0–15.0)

## 2024-02-19 LAB — HEPATIC FUNCTION PANEL
ALT: 157 U/L — ABNORMAL HIGH (ref 0–44)
AST: 158 U/L — ABNORMAL HIGH (ref 15–41)
Albumin: 2.9 g/dL — ABNORMAL LOW (ref 3.5–5.0)
Alkaline Phosphatase: 62 U/L (ref 38–126)
Bilirubin, Direct: 0.3 mg/dL — ABNORMAL HIGH (ref 0.0–0.2)
Indirect Bilirubin: 0.6 mg/dL (ref 0.3–0.9)
Total Bilirubin: 1 mg/dL (ref 0.0–1.2)
Total Protein: 4.9 g/dL — ABNORMAL LOW (ref 6.5–8.1)

## 2024-02-19 LAB — PROCALCITONIN: Procalcitonin: 100 ng/mL

## 2024-02-19 LAB — PHOSPHORUS: Phosphorus: 3.5 mg/dL (ref 2.5–4.6)

## 2024-02-19 LAB — BLOOD GAS, VENOUS: Patient temperature: 37

## 2024-02-19 LAB — TROPONIN T, HIGH SENSITIVITY
Troponin T High Sensitivity: 92 ng/L — ABNORMAL HIGH (ref 0–19)
Troponin T High Sensitivity: 96 ng/L — ABNORMAL HIGH (ref 0–19)

## 2024-02-19 LAB — MAGNESIUM: Magnesium: 3.5 mg/dL — ABNORMAL HIGH (ref 1.7–2.4)

## 2024-02-19 LAB — VITAMIN B12: Vitamin B-12: 3089 pg/mL — ABNORMAL HIGH (ref 180–914)

## 2024-02-19 MED ORDER — FENTANYL CITRATE (PF) 100 MCG/2ML IJ SOLN: 25.0000 ug | INTRAMUSCULAR | Status: DC | PRN

## 2024-02-19 MED ORDER — DEXTROSE 10 % IV SOLN: INTRAVENOUS | Status: DC

## 2024-02-19 MED ORDER — ETOMIDATE 2 MG/ML IV SOLN
INTRAVENOUS | Status: DC | PRN
  Administered 2024-02-19: 16 mg via INTRAVENOUS

## 2024-02-19 MED ORDER — PROPOFOL 1000 MG/100ML IV EMUL
0.0000 ug/kg/min | INTRAVENOUS | Status: AC
  Administered 2024-02-19 (×2): 25 ug/kg/min via INTRAVENOUS
  Administered 2024-02-20: 5 ug/kg/min via INTRAVENOUS
  Administered 2024-02-20: 25 ug/kg/min via INTRAVENOUS
  Administered 2024-02-22 – 2024-02-23 (×2): 5 ug/kg/min via INTRAVENOUS
  Filled 2024-02-19 (×6): qty 100

## 2024-02-19 MED ORDER — FENTANYL 2500MCG IN NS 250ML (10MCG/ML) PREMIX INFUSION
0.0000 ug/h | INTRAVENOUS | Status: DC
  Administered 2024-02-19 – 2024-02-21 (×2): 25 ug/h via INTRAVENOUS
  Filled 2024-02-19 (×2): qty 250

## 2024-02-19 MED ORDER — HYDROMORPHONE HCL 1 MG/ML IJ SOLN: 0.5000 mg | INTRAMUSCULAR | Status: DC | PRN

## 2024-02-19 MED ORDER — ALBUMIN HUMAN 25 % IV SOLN
50.0000 g | Freq: Once | INTRAVENOUS | Status: AC
  Administered 2024-02-19: 50 g via INTRAVENOUS
  Filled 2024-02-19: qty 200

## 2024-02-19 MED ORDER — PROPOFOL 1000 MG/100ML IV EMUL
INTRAVENOUS | Status: AC
  Filled 2024-02-19: qty 100

## 2024-02-19 MED ORDER — PROPOFOL 10 MG/ML IV BOLUS
INTRAVENOUS | Status: AC
  Filled 2024-02-19: qty 20

## 2024-02-19 MED ORDER — BUPIVACAINE LIPOSOME 1.3 % IJ SUSP
INTRAMUSCULAR | Status: AC
  Filled 2024-02-19: qty 20

## 2024-02-19 MED ORDER — PHENYLEPHRINE HCL-NACL 20-0.9 MG/250ML-% IV SOLN
0.0000 ug/min | INTRAVENOUS | Status: DC
  Administered 2024-02-19: 25 ug/min via INTRAVENOUS
  Administered 2024-02-19: 45 ug/min via INTRAVENOUS
  Filled 2024-02-19: qty 250

## 2024-02-19 MED ORDER — 0.9 % SODIUM CHLORIDE (POUR BTL) OPTIME
TOPICAL | Status: DC | PRN
  Administered 2024-02-19: 1000 mL

## 2024-02-19 MED ORDER — DEXTROSE 50 % IV SOLN
12.5000 g | INTRAVENOUS | Status: AC
  Administered 2024-02-19: 12.5 g via INTRAVENOUS

## 2024-02-19 MED ORDER — LINEZOLID 600 MG/300ML IV SOLN
600.0000 mg | Freq: Two times a day (BID) | INTRAVENOUS | Status: DC
  Administered 2024-02-19 (×2): 600 mg via INTRAVENOUS
  Filled 2024-02-19 (×3): qty 300

## 2024-02-19 MED ORDER — NOREPINEPHRINE 4 MG/250ML-% IV SOLN
0.0000 ug/min | INTRAVENOUS | Status: DC
  Administered 2024-02-19: 15 ug/min via INTRAVENOUS
  Filled 2024-02-19: qty 250

## 2024-02-19 MED ORDER — LACTATED RINGERS IV SOLN: INTRAVENOUS | Status: DC | PRN

## 2024-02-19 MED ORDER — LACTATED RINGERS IV BOLUS
500.0000 mL | Freq: Once | INTRAVENOUS | Status: AC
  Administered 2024-02-19: 500 mL via INTRAVENOUS

## 2024-02-19 MED ORDER — BUPIVACAINE-EPINEPHRINE (PF) 0.5% -1:200000 IJ SOLN
INTRAMUSCULAR | Status: AC
  Filled 2024-02-19: qty 30

## 2024-02-19 MED ORDER — ORAL CARE MOUTH RINSE: 15.0000 mL | OROMUCOSAL | Status: AC | PRN

## 2024-02-19 MED ORDER — ROCURONIUM BROMIDE 100 MG/10ML IV SOLN
INTRAVENOUS | Status: DC | PRN
  Administered 2024-02-19: 80 mg via INTRAVENOUS

## 2024-02-19 MED ORDER — MUPIROCIN 2 % EX OINT
1.0000 | TOPICAL_OINTMENT | Freq: Two times a day (BID) | CUTANEOUS | Status: AC
  Administered 2024-02-19 – 2024-02-27 (×18): 1 via NASAL
  Filled 2024-02-19: qty 22

## 2024-02-19 MED ORDER — LIDOCAINE HCL (CARDIAC) PF 100 MG/5ML IV SOSY
PREFILLED_SYRINGE | INTRAVENOUS | Status: DC | PRN
  Administered 2024-02-19: 100 mg via INTRAVENOUS

## 2024-02-19 MED ORDER — CHLORHEXIDINE GLUCONATE CLOTH 2 % EX PADS
6.0000 | MEDICATED_PAD | Freq: Every day | CUTANEOUS | Status: DC
  Administered 2024-02-19: 6 via TOPICAL

## 2024-02-19 MED ORDER — ORAL CARE MOUTH RINSE
15.0000 mL | OROMUCOSAL | Status: AC
  Administered 2024-02-19 – 2024-02-27 (×100): 15 mL via OROMUCOSAL

## 2024-02-19 MED ORDER — DEXTROSE 50 % IV SOLN
INTRAVENOUS | Status: AC
  Administered 2024-02-19: 12.5 g via INTRAVENOUS
  Filled 2024-02-19: qty 50

## 2024-02-19 MED ORDER — PHENYLEPHRINE HCL-NACL 20-0.9 MG/250ML-% IV SOLN
INTRAVENOUS | Status: AC
  Filled 2024-02-19: qty 250

## 2024-02-19 MED ORDER — DEXTROSE 50 % IV SOLN: 12.5000 g | INTRAVENOUS | Status: AC

## 2024-02-19 MED ORDER — ALBUMIN HUMAN 5 % IV SOLN
INTRAVENOUS | Status: AC
  Filled 2024-02-19: qty 250

## 2024-02-19 MED ORDER — SODIUM BICARBONATE 8.4 % IV SOLN
100.0000 meq | Freq: Once | INTRAVENOUS | Status: AC
  Administered 2024-02-19: 100 meq via INTRAVENOUS
  Filled 2024-02-19: qty 50

## 2024-02-19 MED ORDER — POLYETHYLENE GLYCOL 3350 17 G PO PACK: 17.0000 g | PACK | Freq: Every day | ORAL | Status: DC

## 2024-02-19 MED ORDER — DEXTROSE 50 % IV SOLN
25.0000 g | INTRAVENOUS | Status: AC
  Administered 2024-02-19: 25 g via INTRAVENOUS
  Filled 2024-02-19: qty 50

## 2024-02-19 MED ORDER — NOREPINEPHRINE 4 MG/250ML-% IV SOLN
INTRAVENOUS | Status: AC
  Administered 2024-02-19: 5 ug/min via INTRAVENOUS
  Filled 2024-02-19: qty 250

## 2024-02-19 MED ORDER — DEXTROSE 50 % IV SOLN
INTRAVENOUS | Status: AC
  Filled 2024-02-19: qty 50

## 2024-02-19 MED ORDER — ACETAMINOPHEN 10 MG/ML IV SOLN
INTRAVENOUS | Status: DC | PRN
  Administered 2024-02-19: 1000 mg via INTRAVENOUS

## 2024-02-19 MED ORDER — SENNA 8.6 MG PO TABS: 1.0000 | ORAL_TABLET | Freq: Two times a day (BID) | ORAL | Status: DC

## 2024-02-19 MED ORDER — ACETAMINOPHEN 10 MG/ML IV SOLN
1000.0000 mg | Freq: Four times a day (QID) | INTRAVENOUS | Status: AC
  Administered 2024-02-19 – 2024-02-20 (×3): 1000 mg via INTRAVENOUS
  Filled 2024-02-19 (×4): qty 100

## 2024-02-19 MED ORDER — FENTANYL BOLUS VIA INFUSION
25.0000 ug | INTRAVENOUS | Status: DC | PRN
  Administered 2024-02-21 – 2024-02-22 (×2): 25 ug via INTRAVENOUS

## 2024-02-19 MED ORDER — CALCIUM GLUCONATE-NACL 1-0.675 GM/50ML-% IV SOLN
1.0000 g | Freq: Once | INTRAVENOUS | Status: AC
  Administered 2024-02-19: 1000 mg via INTRAVENOUS
  Filled 2024-02-19: qty 50

## 2024-02-19 MED ORDER — ACETAMINOPHEN 10 MG/ML IV SOLN
INTRAVENOUS | Status: AC
  Filled 2024-02-19: qty 100

## 2024-02-19 MED ORDER — LACTATED RINGERS IV BOLUS: 500.0000 mL | Freq: Once | INTRAVENOUS | Status: DC

## 2024-02-19 MED ORDER — PANTOPRAZOLE SODIUM 40 MG IV SOLR
40.0000 mg | Freq: Every day | INTRAVENOUS | Status: AC
  Administered 2024-02-19 – 2024-02-27 (×9): 40 mg via INTRAVENOUS
  Filled 2024-02-19 (×9): qty 10

## 2024-02-19 MED ORDER — HEPARIN SODIUM (PORCINE) 5000 UNIT/ML IJ SOLN
5000.0000 [IU] | Freq: Three times a day (TID) | INTRAMUSCULAR | Status: DC
  Administered 2024-02-19 – 2024-02-20 (×3): 5000 [IU] via SUBCUTANEOUS
  Filled 2024-02-19 (×3): qty 1

## 2024-02-19 MED ORDER — SODIUM CHLORIDE 0.9 % IV SOLN: 250.0000 mL | INTRAVENOUS | Status: AC

## 2024-02-19 MED ORDER — DEXTROSE IN LACTATED RINGERS 5 % IV SOLN: INTRAVENOUS | Status: AC

## 2024-02-19 MED ORDER — NOREPINEPHRINE 4 MG/250ML-% IV SOLN
0.0000 ug/min | INTRAVENOUS | Status: DC
  Administered 2024-02-19: 4 ug/min via INTRAVENOUS
  Administered 2024-02-19: 6 ug/min via INTRAVENOUS
  Administered 2024-02-20 (×2): 11 ug/min via INTRAVENOUS
  Administered 2024-02-21: 12 ug/min via INTRAVENOUS
  Administered 2024-02-21: 26 ug/min via INTRAVENOUS
  Filled 2024-02-19 (×5): qty 250

## 2024-02-19 MED ORDER — ALBUMIN HUMAN 5 % IV SOLN: INTRAVENOUS | Status: DC | PRN

## 2024-02-19 MED ORDER — PHENYLEPHRINE HCL-NACL 20-0.9 MG/250ML-% IV SOLN
INTRAVENOUS | Status: DC | PRN
  Administered 2024-02-19: 50 ug/min via INTRAVENOUS

## 2024-02-19 NOTE — Anesthesia Procedure Notes (Signed)
 Central Venous Catheter Insertion Performed by: Vicci Camellia Glatter, MD, anesthesiologist Start/End1/29/2026 7:32 AM, 02/19/2024 7:37 AM Patient location: Pre-op. Preanesthetic checklist: patient identified, IV checked, site marked, risks and benefits discussed, surgical consent, monitors and equipment checked, pre-op evaluation, timeout performed and anesthesia consent Position: Trendelenburg Patient sedated Hand hygiene performed  and maximum sterile barriers used  Catheter size: 8 Fr Total catheter length 16. Central line was placed.Double lumen Procedure performed using ultrasound to evaluate access site. Ultrasound Notes:relevant anatomy identified, ultrasound used to visualize needle entry and vessel patent under ultrasound. Attempts: 1 Following insertion, dressing applied, line sutured and Biopatch. Post procedure assessment: blood return through all ports  Patient tolerated the procedure well with no immediate complications.

## 2024-02-19 NOTE — ED Notes (Signed)
 Surgeon at bedside.

## 2024-02-19 NOTE — Anesthesia Preprocedure Evaluation (Addendum)
 "                                  Anesthesia Evaluation  Patient identified by MRN, date of birth, ID band Patient confused and Patient unresponsive  General Assessment Comment:Eyes open spontaneously, no response to commands  Reviewed: Allergy & Precautions, H&P , NPO status , Patient's Chart, lab work & pertinent test results  Airway       Comment: Pt will not open mouth Dental  (+) Poor Dentition   Pulmonary neg pulmonary ROS  Supplemental [place]         Cardiovascular hypertension, + CAD and +CHF   Rhythm:Regular Rate:Tachycardia     Neuro/Psych  negative psych ROS   GI/Hepatic Neg liver ROS, hiatal hernia,,,  Endo/Other  diabetes    Renal/GU      Musculoskeletal   Abdominal   Peds  Hematology negative hematology ROS (+)   Anesthesia Other Findings Intra-abominal sepsis with SBO s/p NGT with septic shock. Emergent procedure. Pt with respiratory failure and hyperkalemia and hypoglycemia on dextrose . NE gtt and fluid bolus undergoing. Trop 60's. Will procedure with RSI with ICU admission. Plan for CVL and arterial line.   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  Mastectomy/              SLN. No date: Chest pain     Comment:  a. 10/2017 Cath: Nl cors. No date: Chronic headache No date: Chronic heart failure with preserved ejection fraction  (HFpEF) (HCC)     Comment:  a. 10/2021 Echo: EF 60-65%, no rwma, mild LVH, GrI DD,               nl RV size/fxn, mild MR. No date: Complication of anesthesia     Comment:  prior to 1991 used to have PONV.  none recently. No date: Diverticulosis of colon No date: Family history of adverse reaction to anesthesia     Comment:  mother/daighter get sick No date: GERD with stricture No date: Hard of hearing No date: Hiatal hernia No date: Hypokalemia No date: Labile hypertension     Comment:  a. 10/2017  Cath: nl renal arteries. No date: Mitral regurgitation     Comment:  a. 10/2021 Echo: Mild MR. No date: Mixed hyperlipidemia No date: Neuropathy     Comment:  feet No date: Osteoporosis No date: Pre-diabetes No date: Pulmonary hypertension (HCC)     Comment:  a. 10/2017 Echo: PASP 35-6mmHg. 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 02/21/2016: CATARACT EXTRACTION W/PHACO; Left     Comment:  Procedure: CATARACT EXTRACTION PHACO AND INTRAOCULAR               LENS PLACEMENT (IOC)  Left eye;  Surgeon: Dene  Brasington, MD;  Location: [facility];  Service:              Ophthalmology;  Laterality: Left;  Left eye Diabetic 03/13/2016: CATARACT EXTRACTION W/PHACO; Right     Comment:  Procedure: CATARACT EXTRACTION PHACO AND INTRAOCULAR               LENS PLACEMENT (IOC)  right  diabetic;  Surgeon: Dene Etienne, MD;  Location: [facility];  Service:              Ophthalmology;  Laterality: Right;  diabetic 1978: CHOLECYSTECTOMY 11/05/2023: COLONOSCOPY; N/A     Comment:  Procedure: COLONOSCOPY;  Surgeon: Unk Corinn Skiff,               MD;  Location: [facility] ENDOSCOPY;  Service:               Gastroenterology;  Laterality: N/A; 09/09/2013: COLONOSCOPY W/ POLYPECTOMY     Comment:  8 mm tubular adenoma of the cecum.  Lamar Aho, MD.               [facility] 06/04/2019: COLONOSCOPY WITH PROPOFOL ; N/A     Comment:  Procedure: COLONOSCOPY WITH PROPOFOL ;  Surgeon: Jinny Carmine, MD;  Location: [facility] ENDOSCOPY;  Service:               Endoscopy;  Laterality: N/A; 11/05/2023:  ESOPHAGOGASTRODUODENOSCOPY; N/A     Comment:  Procedure: EGD (ESOPHAGOGASTRODUODENOSCOPY);  Surgeon:               Unk Corinn Skiff, MD;  Location: [facility] ENDOSCOPY;                Service: Gastroenterology;  Laterality: N/A; 06/04/2019: ESOPHAGOGASTRODUODENOSCOPY (EGD) WITH PROPOFOL ; N/A     Comment:  Procedure: ESOPHAGOGASTRODUODENOSCOPY (EGD) WITH               PROPOFOL ;  Surgeon: Jinny Carmine, MD;  Location: [facility]               ENDOSCOPY;  Service: Endoscopy;  Laterality: N/A; No date: JOINT REPLACEMENT; Left     Comment:  knee  11/06/2017: LEFT HEART CATH AND CORONARY ANGIOGRAPHY; N/A     Comment:  Procedure: LEFT HEART CATH AND CORONARY ANGIOGRAPHY;                Surgeon: Mady Bruckner, MD;  Location: [facility] INVASIVE               CV LAB;  Service: Cardiovascular;  Laterality: N/A; 06/13/2017: MASTECTOMY; Left     Comment:  mastectomy neg margins No date: OOPHORECTOMY     Comment:  one still left 11/06/2017: RENAL ANGIOGRAPHY     Comment:  Procedure: RENAL ANGIOGRAPHY;  Surgeon: Mady Bruckner, MD;  Location: [facility] INVAS[facility] CV LAB;                Service: Cardiovascular;; 06/13/2017: SENTINEL NODE BIOPSY; Left     Comment:  Procedure: SENTINEL NODE BIOPSY;  Surgeon: Dessa Reyes ORN, MD;  Location: [facility] ORS;  Service: General;                Laterality: Left; 06/13/2017: SIMPLE MASTECTOMY  WITH AXILLARY SENTINEL NODE BIOPSY; Left     Comment:  8 mm high grade DCIS, negative SLN. ER/PR+.  Surgeon:               Dessa Reyes ORN, MD;  Location: [facility] ORS;  Service:               General;  Laterality: Left;  BMI    Body Mass Index: 23.42 kg/m      Reproductive/Obstetrics negative OB ROS                              Anesthesia Physical Anesthesia Plan  ASA: 4 and emergent  Anesthesia Plan: General ETT   Post-op Pain Management:    Induction: Intravenous and Rapid sequence  PONV Risk Score and Plan: 2 and  Ondansetron , Dexamethasone  and Treatment may vary due to age or medical condition  Airway Management Planned: Oral ETT  Additional Equipment:   Intra-op Plan:   Post-operative Plan: Possible Post-op intubation/ventilation  Informed Consent: I have reviewed the patients History and Physical, chart, labs and discussed the procedure including the risks, benefits and alternatives for the proposed anesthesia with the patient or authorized representative who has indicated his/her understanding and acceptance.     Dental Advisory Given and Consent reviewed with POA  Plan Discussed with: CRNA and Surgeon  Anesthesia Plan Comments:          Anesthesia Quick Evaluation  "

## 2024-02-19 NOTE — Transfer of Care (Signed)
 Immediate Anesthesia Transfer of Care Note  Patient: Ashley Ryan  Procedure(s) Performed: LAPAROTOMY, EXPLORATORY (Abdomen)  Patient Location: PACU  Anesthesia Type:General  Level of Consciousness: sedated  Airway & Oxygen Therapy: Patient remains intubated per anesthesia plan and Patient placed on Ventilator (see vital sign flow sheet for setting)  Post-op Assessment: Report given to RN and Post -op Vital signs reviewed and stable  Post vital signs: stable  Last Vitals:  Vitals Value Taken Time  BP 118/92 02/19/24 09:25  Temp    Pulse    Resp 18 02/19/24 09:30  SpO2    Vitals shown include unfiled device data.  Last Pain:  Vitals:   02/19/24 0419  TempSrc: Oral  PainSc:          Complications: There were no known notable events for this encounter.

## 2024-02-19 NOTE — OR Nursing (Signed)
 Patient arrived from the ED for emergent surgery.  CM showing ST, O2 via [place], NG in place, currently clamped.  Patient lying in bed with eyes open, minimally responsive, moaning to touch, especiaslly abdomen but not following commands or verbally responding.  Levophed  drip and calcium  gluconate infusing via IV.  Surgeon and anesthesia alerted to patient location in room 19 preop.  CHG wipes total body performed, unable to do CHG mouthwash.

## 2024-02-19 NOTE — Anesthesia Procedure Notes (Addendum)
 Arterial Line Insertion Start/End1/29/2026 7:24 AM, 02/19/2024 9:27 AM Performed by: Vicci Camellia Glatter, MD, anesthesiologist  Patient location: Pre-op. Preanesthetic checklist: patient identified, IV checked, site marked, risks and benefits discussed, surgical consent, monitors and equipment checked, pre-op evaluation, timeout performed and anesthesia consent Left, radial was placed Catheter size: 20 G Hand hygiene performed  and maximum sterile barriers used   Attempts: 1 Procedure performed using ultrasound to evaluate access site. Ultrasound Notes:relevant anatomy identified, ultrasound used to visualize needle entry and vessel patent under ultrasound. Following insertion, dressing applied. Post procedure assessment: normal and unchanged  Patient tolerated the procedure well with no immediate complications.

## 2024-02-19 NOTE — Progress Notes (Signed)
 "  NAME:  Ashley Ryan, MRN:  [id number], DOB:  Mar 05, 1945, LOS: 1 ADMISSION DATE:  02/18/2024, CONSULTATION DATE:  1/28 REFERRING MD:  Dr. Fernand, CHIEF COMPLAINT:  abdominal pain; hypotension   Brief Synopsis:  79 year old female with a past medical history significant for hypertension, hyperlipidemia, type II diabetes, hiatal hernia and breast cancer admitted for SBO and AKI. Course complicated by septic shock, AGMA with worsening lactic acidosis and acute metabolic encephalopathy requiring emergent ex-lap.  History of Present Illness:  Ashley Ryan is a 79 y.o. 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    History is gathered per chart review Patient reported the worsening abdominal pain with nausea and vomiting started 2 days ago, but had been progressively getting worse. It was stated she had not had a bowel movement in two days prior to arrival. She had multiple episodes of nausea and vomiting. Given worsening symptoms, EMS was called.  ED/Hospital course: On arrival to the ED patient was noted to be hemodynamically stable.  Lab work and imaging obtained. Surgery was consulted due to imaging findings and examined her. Dr. Marinda reported a low threshold for taking her to the operating room, but would continue to monitor her labs and start NG tube for decompression. Given lab results, she was started on Zosyn . As surgery did not feel intervention was warranted at that time, [facility] was contacted for admission. PCCM consulted 1/28 afternoon for potential vasopressors. She remained stable with fluid resuscitation throughout the evening.  Vitals on Arrival: Temp:  97.8 F, RR 31, HR 128, BP 107/73, SpO2 100% on room air   Pertinent Labs/Diagnostics on Arrival:  CBC with significant  leukocytosis to 18.7, normal hemoglobin with elevated hematocrit consistent with hemoconcentration.  CMP with moderate hyperglycemia, AKI, ALT and AST elevation.  Lipase normal.  Troponin mildly elevated but stable.  LILLETTE Robet Kim, PA-C personally viewed and interpreted this ECG. EKG Interpretation: Date: 1/28, EKG Time: 1243, Rate: 137, Rhythm: sinus tach, QRS Axis:  normal Intervals: normal, ST/T Wave abnormalities: nonspecific changes, Narrative Interpretation: abnormal ECG with nonspecific ST changes  Most Recent Pertinent Labs: Chemistry:  Na+:137 K+: 5.4 BUN/Cr.: 37/1.77 Serum CO2/ AG: 18/20 Glucose: 133 AST/ALT: 158/157  Hematology:  WBC: 16.9 Hgb: 12.7 Platelets 203 INR:  Lactic: >9, 7.9, 5.1, 5.4 >> 7.3 VBG: pH 7.34, pCO2 46, bicarb 24.8  ID: UA: non-infectious appearance  CXR Impression:  1. Significant interval enlargement of the hiatal hernia with diffuse gaseous distension of both the intrathoracic and intra-abdominal portions of the stomach. This suggests the possibility of gastric outlet obstruction. Recommend further evaluation with contrast-enhanced CT scan of the abdomen and pelvis. 2. Slightly increased atelectasis in the right lung surrounding the enlarging hiatal hernia. Otherwise, no acute cardiopulmonary process.  CT Abd/Pelvis Impression: 1. High grade small bowel obstruction with obstruction favored to be in the right lower quadrant slash right pelvis . 2. Small amount of free fluid and hazy mesentery within the distal small bowel and along the left pericardial gutter related to small bowel obstruction and venous congestion . 3. No pneumatosis or portal venous gas.no intraperitoneal free air. . 4. Very large hiatal hernia with half the stomach above the hemidiaphragm and markedly distended  Abd Xray Impression: 1. Nasogastric tube terminates within a large hiatal hernia in  the right lower chest. 2. Mildly dilated small bowel loops in the  central abdomen.  STAT CT Abd/Pelvis Impression: 1. Persistent proximal and mid small bowel dilatation compatible with small bowel obstruction, with transition point in the right paramidline pelvis, most likely due to adhesive disease; associated ileocolic mesenteric edema and pelvic free fluid. 2. No discrete fluid collection or pneumoperitoneum. 3. Large hiatal hernia with greater than 50% intrathoracic stomach, with nasogastric tube tip within the intrathoracic stomach and fluid-distended intrathoracic and intraabdominal stomach.  Medications Administered:  IVF: LR @ 125 + 2 boluses ABX: zosyn  Vasopressor: levophed   Zofran  Morphine  4mg  Dilaudid  1mg  x 1  PCCM to take over due to hypotension requiring levophed  gtt.   Pertinent Medical History   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  Mastectomy/ SLN.   Chest pain    a. 10/2017 Cath: Nl cors.   Chronic headache    Chronic heart failure with preserved ejection fraction (HFpEF) (HCC)    a. 10/2021 Echo: EF 60-65%, no rwma, mild LVH, GrI DD, nl RV size/fxn, mild MR.   Complication of anesthesia    prior to 1991 used to have PONV.  none recently.   Diverticulosis of colon    Family history of adverse reaction to anesthesia    mother/daighter get sick   GERD with stricture    Hard of hearing    Hiatal hernia    Hypokalemia    Labile hypertension    a. 10/2017 Cath: nl renal arteries.   Mitral regurgitation    a. 10/2021 Echo: Mild MR.   Mixed hyperlipidemia    Neuropathy    feet   Osteoporosis    Pre-diabetes    Pulmonary hypertension (HCC)    a. 10/2017 Echo: PASP 35-3mmHg.   Wears dentures    full upper    Significant Hospital Events: Including procedures, antibiotic start and stop dates in addition to other pertinent events   1/28: Admitted for SBO and sepsis. Surgery evaluated her and did not deem  interventions necessary at time of admission. PCCM consulted later in the afternoon for hypotension; was responsive to fluid throughout the evening and into the early morning of 1/29. 1/29: Worsening lactic acidosis with septic shock requiring pressors. PCCM team to take over. Surgery taking her for emergent Ex-Lap. Remains on LR; added D10. Additional 2 boluses of LR administered. Repeat imaging did not reveal perforation. Remains on zosyn .  Interim History / Subjective:  1/29 @ 0430: Acutely worsening lactic acidosis with septic shock requiring initiation of levophed  gtt and additional 2 boluses of LR. Stat CT Abd/pelvis without signs of perforation. Exam with worsening mental status and peritonitic signs. Surgery notified and plan to take her to the OR for an emergent Ex-Lap.   Objective    Blood pressure (!) 96/45, pulse (!) 42, temperature 98 F (36.7 C), temperature source Oral, resp. rate (!) 22, weight 52.6 kg, SpO2 99%.        Intake/Output Summary (Last 24 hours) at 02/19/2024 0546 Last data filed at 02/19/2024 0545 Gross per 24 hour  Intake 612.64 ml  Output 2750 ml  Net -2137.36 ml   Filed Weights   02/18/24 1800  Weight: 52.6 kg    Examination: General: Adult female, critically ill, lying in bed, lethargic on 4L [place] HENT: anicteric sclerae, atraumatic, normocephalic, neck supple, JVD CV: RRR, S1 S2,  sinus tachycardia on monitor, no r/m/g Pulm: tachypneic on 4L [place], breath sounds with scattered mildly coarse breath sounds throughout GI: soft, tender to palpation, distended, rebound tenderness, mildly hypoactive bowel sounds x 4 Extremities: warm/dry, pulses + 2 R/P, 2+ edema noted Neuro: Opens eyes to voice, very lethargic, not following commands, no focal neuro deficits, PERRL   GU: deferred   Resolved problem list   Assessment and Plan   #Acute Metabolic Encephalopathy s/t Septic Shock - Supportive care - Maintain HOB at least 30 degrees - Control pain as able:  dilaudid  0.5mg  q4h prn - If remains intubated post Ex-Lap, initiate PAD protocol  #Circulatory Shock #Septic shock with unknown etiology ~ Suspected intra-abdominal process d/t SBO vs Intestinal Ischemia? #Mitral Regurgitation #Chronic HFpEF (LVEF 60-65% 2023) Hx: MR, Hypertension, Hyperlipidemia Initial interventions/workup included: 3 L of LR  ABX: Zosyn  - Continuous cardiac monitoring - Consider vasopressors to maintain MAP goal > 65 ~ on peripheral levophed  - Consider stress dose steroids if additional pressors added - Echo ordered - Trend WBC and monitor fever curve - Blood cultures pending - Lactic on admission: >9.0 - Trend lactic/ PCT - Repeat VBG ordered - IV antibiotics: Zosyn  - Continue LR infusion @ 125; received an additional 2 boluses  #Acute Hypoxic Respiratory Failure secondary to Septic Shock  Hx: Pulmonary Hypertension - Supplemental O2 to maintain SpO2 >92% - Likely will remain intubated post Ex-Lap - Head of bed elevated 30 degrees - Intermittent chest x-ray & ABG PRN - F/u cultures, trend PCT  #Acute Kidney Injury Secondary Septic Shock #AG Metabolic Acidosis with Lactic Acidosis #Hyperkalemia Creatinine 1.51 on admission; 0.66 at baseline (12/09/23) - Strict I/Os - Inform provider if UO is < 0.5 mL/kg/h - Trend BMP, Mg and Phosphorus - Avoid nephrotoxins as able - Ensure adequate renal perfusion - ICU electrolyte replacement protocol - Pharmacy to assist with replacement as indicated - Shifting measures as indicated - Continue fluid resuscitation - Given 2 amps of bicarb and 1g calcium  gluconate - Repeat BMP ordered  #Shock Liver #Transaminitis #Small Bowel Obstruction #? Suspected Intestinal Ischemia #Paraesophageal Hernia - Trend hepatic function - AST/ALT on admission 64/64 >> 158/157 - Diet: NPO - NGT to LIS for bowel decompression - Constipation protocol prn - GI prophylaxis: Protonix  40mg  - Zofran  for nausea prn - Dr. Marinda taking  the patient emergently to OR for Ex-Lap - Surgery following; appreciate input  #VTE Prophylaxis - Trend CBC - Transfuse if Hgb < 7 or platelets < 7 or active bleeding - Monitor for s/sx of bleeding - DVT prophylaxis: SCDs  #Hypoglycemia - ICU hypo/hyperglycemia protocol - CBG q4h - SSI as indicated - Goal Range: 140-180 - Started D10   Labs   CBC: Recent Labs  Lab 02/18/24 1417 02/19/24 0413  WBC 18.7* 16.9*  HGB 13.5 12.7  HCT 43.7 41.0  MCV 85.7 84.7  PLT 235 203    Basic Metabolic Panel: Recent Labs  Lab 02/18/24 1417 02/18/24 1627 02/19/24 0450  NA 141  --  137  K 3.7  --  5.4*  CL 99  --  99  CO2 23  --  18*  GLUCOSE 143*  --  133*  BUN 26*  --  37*  CREATININE 1.51*  --  1.77*  CALCIUM  10.1  --  8.8*  MG  --  4.0*  --    GFR: Estimated Creatinine Clearance: 18.8 mL/min (A) (by C-G formula based on SCr of 1.77 mg/dL (H)). Recent Labs  Lab 02/18/24  1417 02/18/24 1627 02/18/24 1948 02/18/24 2057 02/19/24 0346 02/19/24 0413  WBC 18.7*  --   --   --   --  16.9*  LATICACIDVEN >9.0* 7.9* 5.1* 5.4* 7.3*  --     Liver Function Tests: Recent Labs  Lab 02/18/24 1417 02/19/24 0450  AST 64* 158*  ALT 64* 157*  ALKPHOS 80 62  BILITOT 0.6 1.0  PROT 6.3* 4.9*  ALBUMIN  4.0 2.9*   Recent Labs  Lab 02/18/24 1417  LIPASE 17   No results for input(s): AMMONIA in the last 168 hours.  ABG    Component Value Date/Time   HCO3 24.8 02/19/2024 0434   ACIDBASEDEF 1.3 02/19/2024 0434   O2SAT PENDING 02/19/2024 0434     Coagulation Profile: No results for input(s): INR, PROTIME in the last 168 hours.  Cardiac Enzymes: No results for input(s): CKTOTAL, CKMB, CKMBINDEX, TROPONINI in the last 168 hours.  HbA1C: Hemoglobin A1C  Date/Time Value Ref Range Status  09/16/2023 08:37 AM 5.6 4.0 - 5.6 % Final   Hgb A1c MFr Bld  Date/Time Value Ref Range Status  02/18/2023 08:26 AM 6.0 4.6 - 6.5 % Final    Comment:    Glycemic Control  Guidelines for People with Diabetes:Non Diabetic:  <6%Goal of Therapy: <7%Additional Action Suggested:  >8%   02/12/2022 07:36 AM 6.2 4.6 - 6.5 % Final    Comment:    Glycemic Control Guidelines for People with Diabetes:Non Diabetic:  <6%Goal of Therapy: <7%Additional Action Suggested:  >8%     CBG: Recent Labs  Lab 02/18/24 2128 02/19/24 0042 02/19/24 0434 02/19/24 0504  GLUCAP 86 74 55* 78    Review of Systems:   Review of Systems  Constitutional:  Positive for malaise/fatigue. Negative for fever.  Respiratory:  Positive for shortness of breath. Negative for cough, sputum production and wheezing.   Cardiovascular:  Positive for leg swelling. Negative for chest pain and palpitations.  Gastrointestinal:  Positive for abdominal pain and constipation. Negative for nausea and vomiting.  Neurological:  Negative for dizziness, loss of consciousness and weakness.     Past Medical History:  She,  has a past medical history of Anemia, Arthritis, Breast cancer (HCC) (06/13/2017), Chest pain, Chronic headache, Chronic heart failure with preserved ejection fraction (HFpEF) (HCC), Complication of anesthesia, Diverticulosis of colon, Family history of adverse reaction to anesthesia, GERD with stricture, Hard of hearing, Hiatal hernia, Hypokalemia, Labile hypertension, Mitral regurgitation, Mixed hyperlipidemia, Neuropathy, Osteoporosis, Pre-diabetes, Pulmonary hypertension (HCC), and Wears dentures.   Surgical History:   Past Surgical History:  Procedure Laterality Date   ABDOMINAL HYSTERECTOMY  1978   one ovary remains   BREAST BIOPSY Left 05/26/2017   Affirm Bx- coil clip Ductal carcinoma in situ, high nuclear grade with calcifications and focal comedonecrosis   CARDIAC CATHETERIZATION     yrs ago all OK.   CATARACT EXTRACTION W/PHACO Left 02/21/2016   Procedure: CATARACT EXTRACTION PHACO AND INTRAOCULAR LENS PLACEMENT (IOC)  Left eye;  Surgeon: Dene Etienne, MD;  Location:  [facility];  Service: Ophthalmology;  Laterality: Left;  Left eye Diabetic   CATARACT EXTRACTION W/PHACO Right 03/13/2016   Procedure: CATARACT EXTRACTION PHACO AND INTRAOCULAR LENS PLACEMENT (IOC)  right  diabetic;  Surgeon: Dene Etienne, MD;  Location: [facility];  Service: Ophthalmology;  Laterality: Right;  diabetic   CHOLECYSTECTOMY  1978   COLONOSCOPY N/A 11/05/2023   Procedure: COLONOSCOPY;  Surgeon: Unk Corinn Skiff, MD;  Location: [facility] ENDOSCOPY;  Service: Gastroenterology;  Laterality: N/A;  COLONOSCOPY W/ POLYPECTOMY  09/09/2013   8 mm tubular adenoma of the cecum.  Lamar Aho, MD. [facility]   COLONOSCOPY WITH PROPOFOL  N/A 06/04/2019   Procedure: COLONOSCOPY WITH PROPOFOL ;  Surgeon: Jinny Carmine, MD;  Location: [facility] ENDOSCOPY;  Service: Endoscopy;  Laterality: N/A;   ESOPHAGOGASTRODUODENOSCOPY N/A 11/05/2023   Procedure: EGD (ESOPHAGOGASTRODUODENOSCOPY);  Surgeon: Unk Corinn Skiff, MD;  Location: [facility] ENDOSCOPY;  Service: Gastroenterology;  Laterality: N/A;   ESOPHAGOGASTRODUODENOSCOPY (EGD) WITH PROPOFOL  N/A 06/04/2019   Procedure: ESOPHAGOGASTRODUODENOSCOPY (EGD) WITH PROPOFOL ;  Surgeon: Jinny Carmine, MD;  Location: [facility] ENDOSCOPY;  Service: Endoscopy;  Laterality: N/A;   JOINT REPLACEMENT Left    knee    LEFT HEART CATH AND CORONARY ANGIOGRAPHY N/A 11/06/2017   Procedure: LEFT HEART CATH AND CORONARY ANGIOGRAPHY;  Surgeon: Mady Bruckner, MD;  Location: [facility] INVASIVE CV LAB;  Service: Cardiovascular;  Laterality: N/A;   MASTECTOMY Left 06/13/2017   mastectomy neg margins   OOPHORECTOMY     one still left   RENAL ANGIOGRAPHY  11/06/2017   Procedure: RENAL ANGIOGRAPHY;  Surgeon: Mady Bruckner, MD;  Location: [facility] INVASIVE CV LAB;  Service: Cardiovascular;;   SENTINEL NODE BIOPSY Left 06/13/2017   Procedure: SENTINEL NODE BIOPSY;  Surgeon: Dessa Reyes ORN, MD;  Location: [facility] ORS;  Service: General;  Laterality: Left;   SIMPLE  MASTECTOMY WITH AXILLARY SENTINEL NODE BIOPSY Left 06/13/2017   8 mm high grade DCIS, negative SLN. ER/PR+.  Surgeon: Dessa Reyes ORN, MD;  Location: [facility] ORS;  Service: General;  Laterality: Left;     Social History:   reports that she has never smoked. She has never used smokeless tobacco. She reports that she does not drink alcohol and does not use drugs.   Family History:  Her family history includes Breast cancer in an other family member; Breast cancer (age of onset: 74) in her sister; Coronary artery disease (age of onset: 77) in her father; Diabetes in an other family member; Esophageal cancer in her brother; Heart attack (age of onset: 40) in her mother; Heart disease in an other family member; Hypertension in her father and mother; Stomach cancer in an other family member; Stroke in her father.   Allergies Allergies[1]   Home Medications  Prior to Admission medications  Medication Sig Start Date End Date Taking? Authorizing Provider  amLODipine  (NORVASC ) 5 MG tablet Take 1 tablet (5 mg total) by mouth daily. 01/28/24  Yes Bedsole, Amy E, MD  atorvastatin  (LIPITOR) 40 MG tablet Take 1 tablet (40 mg total) by mouth at bedtime. 01/23/24  Yes Bedsole, Amy E, MD  brimonidine  (ALPHAGAN ) 0.2 % ophthalmic solution brimonidine  0.2 % eye drops  INSTILL 1 DROP INTO BOTH EYES TWICE A DAY   Yes [provider]  busPIRone  (BUSPAR ) 5 MG tablet Take 1 tablet (5 mg total) by mouth 2 (two) times daily. 01/23/24  Yes Bedsole, Amy E, MD  Calcium  Carb-Cholecalciferol  (CALCIUM  600 + D) 600-5 MG-MCG TABS Take 2 tablets by mouth daily. 09/12/21  Yes Babara Call, MD  diclofenac  (VOLTAREN ) 75 MG EC tablet Take 1 tablet (75 mg total) by mouth 2 (two) times daily. 01/23/24  Yes Bedsole, Amy E, MD  diclofenac  Sodium (VOLTAREN ) 1 % GEL APPLY 2 GRAMS TOPICALLY TO THE AFFECTED AREA 4 TIMES DAILY. 06/25/21  Yes Bedsole, Amy E, MD  esomeprazole  (NEXIUM ) 40 MG capsule TAKE 1 CAPSULE TWICE DAILY BEFORE MEALS 01/23/24  Yes  Bedsole, Amy E, MD  fluticasone  (FLONASE ) 50 MCG/ACT nasal spray USE 2 SPRAYS INTO  THE NOSE DAILY AS DIRECTED (SUBSTITUTED FOR FLONASE ) 01/23/24  Yes Bedsole, Amy E, MD  furosemide  (LASIX ) 20 MG tablet TAKE 1 TABLET BY MOUTH EVERY DAY AS NEEDED 01/15/18  Yes Bedsole, Amy E, MD  gabapentin  (NEURONTIN ) 100 MG capsule TAKE 1 CAPSULE EVERY MORNING , 1 CAPSULE AT LUNCH, 1 CAPSULE AT DINNER, AND 2-3 CAPSULES AT BEDTIME 01/28/24  Yes Bedsole, Amy E, MD  Glucosamine 500 MG TABS Take 500 mg by mouth daily.   Yes [provider]  isosorbide  mononitrate (IMDUR ) 30 MG 24 hr tablet Take 1 tablet (30 mg total) by mouth daily. 01/23/24  Yes Bedsole, Amy E, MD  latanoprost  (XALATAN ) 0.005 % ophthalmic solution Place 1 drop into both eyes at bedtime.   Yes [provider]  lidocaine  (LIDODERM ) 5 % APPLY 1 PATCH ONTO THE SKIN EVERY DAY. REMOVE AND DISCARD PATCH WITHIN 12 HOURS OR AS DIRECTED BY PHYSICIAN. 01/23/24  Yes Bedsole, Amy E, MD  mirabegron  ER (MYRBETRIQ ) 25 MG TB24 tablet Take 1 tablet (25 mg total) by mouth daily. 01/23/24  Yes Bedsole, Amy E, MD  Omega-3 Fatty Acids (FISH OIL) 1000 MG CAPS Take 1,000 mg by mouth daily.    Yes [provider]  polycarbophil (FIBERCON) 625 MG tablet Take 625 mg by mouth daily.   Yes [provider]  potassium chloride  SA (KLOR-CON  M) 20 MEQ tablet Take 1 tablet (20 mEq total) by mouth 2 (two) times daily. 01/28/24  Yes Bedsole, Amy E, MD  traMADol  (ULTRAM ) 50 MG tablet Take 1 tablet (50 mg total) by mouth every 6 (six) hours as needed. for pain 01/28/24  Yes Bedsole, Amy E, MD  Alcohol Swabs (DROPSAFE ALCOHOL PREP) 70 % PADS USE  TO TEST BLOOD SUGAR ONE TIME DAILY 06/25/21   Avelina, Amy E, MD  Blood Glucose Calibration (ACCU-CHEK AVIVA) SOLN Use to check blood sugar once daily 10/27/18   Bedsole, Amy E, MD  Blood Glucose Calibration (TRUE METRIX LEVEL 1) Low SOLN USE AS DIRECTED 06/12/20   Bedsole, Amy E, MD  Blood Glucose Monitoring Suppl (TRUE METRIX AIR  GLUCOSE METER) w/Device KIT Use to check blood sugar daily 04/30/21   Bedsole, Amy E, MD  glucose blood (TRUE METRIX BLOOD GLUCOSE TEST) test strip Use to check blood sugar daily 04/30/21   Bedsole, Amy E, MD  Iron -Vitamin C  65-125 MG TABS Take 1 tablet by mouth daily at 2 PM. Patient not taking: Reported on 02/18/2024 03/15/22   Babara Call, MD  losartan -hydrochlorothiazide  (HYZAAR) 100-25 MG tablet Take 1 tablet by mouth daily. Patient not taking: Reported on 02/18/2024    [provider]  triamcinolone  cream (KENALOG ) 0.5 % Apply 1 application topically 2 (two) times daily. Patient not taking: Reported on 02/18/2024 08/12/19   Avelina Greig BRAVO, MD  TRUEplus Lancets 28G MISC TEST BLOOD SUGAR EVERY DAY 06/28/21   Avelina Greig BRAVO, MD     Critical care time: 65 minutes     Robet Kim, PA-C [facility] PCCM Team Contact Info: [phone]           [1]  Allergies Allergen Reactions   Oxycodone  Nausea Only   Sulfa Antibiotics Rash    Childhood reaction   Sulfonamide Derivatives Rash    Childhood reaction.   "

## 2024-02-19 NOTE — Op Note (Signed)
 Operative note  Preoperative diagnosis: Peritonitis Postoperative diagnosis: Necrotic small bowel, internal hernia Surgeon: Jayson Endow, MD Procedure: Exploratory laparotomy, lysis of adhesions for 30 minutes, reduction of internal hernia, small bowel resection, placement of ABThera negative pressure wound VAC measuring greater than 50 sq cm  EBL: 15ccs  Specimen: Small Bowel  Indication: This is an 79 year old that presented to the hospital with a small bowel obstruction.  An NG tube was placed and she was undergoing decompression.  However she acutely decompensated overnight requiring pressors and became increasingly hypotensive and tachycardic.  Her exam changed and she developed peritonitis so she was taken to the operating room emergently for exploratory laparotomy  After informed consent was obtained the patient was brought to the operating room placed supine on the operating room table.  A Foley catheter was placed.  An arterial line and a central line were placed by our anesthesia colleagues please see separate procedure note.  Her abdomen was then prepped and draped in the usual sterile fashion.  A surgical timeout was then called identifying correct patient, site, side and procedure.  A midline incision was made and taken down through the subcutaneous tissue with Bovie cautery.  The anterior fascia was identified and opened with Bovie cautery.  The posterior fascia was then grasped and opened with Metzenbaum scissors.  The peritoneum was then bluntly opened.  There was some adhesions superiorly of the omentum to the anterior abdominal wall.  This was taken down with a combination of sharp dissection with Metzenbaum scissors and Bovie cautery.  This allowed us  to better enter into the abdominal cavity.  The proximal small bowel was identified and eviscerated.  The proximal small bowel was mildly inflamed but without overt necrosis.  It was dilated.  This was traced back to the ligament of  Treitz.  There was no obstruction noted here.  I then traced this eviscerated bowel distally.  There was small bowel that was stuck down into the pelvis.  There was a loop of bowel that was herniated under adhesed omentum.  The omentum was adhesed to the sigmoid colon.  This omentum was dissected off of the sigmoid colon with a combination of Metzenbaum scissors and cautery.  Once this omentum was freed I was able to fully eviscerate the small bowel out of the abdominal cavity.  This reduced an internal hernia that had been caused by the omentum.  Unfortunately, the small bowel that was internally herniated was necrotic.  I elected to perform a resection.  In the proximal bowel there was an area of healthy bowel and the mesentery was opened here.  The bowel was stapled off with a 75 mm GIA blue load stapler.  Distal to the necrotic area another window in the mesentery was made and the distal bowel was stapled off with the same load.  The mesentery was then taken with a LigaSure device.  The small bowel was then passed off the table as specimen.  I then completely ran the bowel again from the ligament of Treitz to our proximal resection and then from our distal resection towards the terminal ileum and there was no other areas of obstruction.  I did dissect off more omentum from the sigmoid colon to reduce the risk of repeat hernias or bowel obstructions.  A total of 30 minutes was spent lysing adhesions. Due to these adhesions it required unusual procedural services.  At that time I discussed with her anesthesia colleagues and the patient was on large amount of  pressors and actually had gone up and her pressor requirements during the case.  Due to this I elected not to perform an anastomosis and the decision was made to leave the patient open and take her to the ICU for resuscitation.  An ABThera negative pressure wound VAC was then placed into the abdomen.  The wound measured approximately 30 cm x 2-1/2 cm x 3 cm.   The negative pressure wound VAC was then placed to suction and good seal was obtained.  Prior to putting on the ABThera all instrument and sponge counts were correct x 2.  The patient was then transferred intubated to the PACU with plans to go to the ICU when there is bed availability.  The plan will be to return to the operating room once the patient is better resuscitated likely tomorrow for final anastomosis and closure.

## 2024-02-19 NOTE — OR Nursing (Signed)
 Verbal consent obtained from daughter via phone, anesthesia (Dr. CHARLENA Louder) spoke via phone with daughter regarding anesthesia for surgery.  FSBS 72.  Patient rolling back to OR.

## 2024-02-19 NOTE — Progress Notes (Signed)
 Initial Nutrition Assessment  DOCUMENTATION CODES:   Not applicable  INTERVENTION:   Recommend TPN initiation   Recommend thiamine 100mg  IV daily x 7 days   Pt at high refeed risk; recommend monitor potassium, magnesium  and phosphorus labs daily until stable  Daily weights   Check B12, folate and copper levels.   NUTRITION DIAGNOSIS:   Inadequate oral intake related to inability to eat (pt sedated and ventilated) as evidenced by NPO status.  GOAL:   Provide needs based on ASPEN/SCCM guidelines  MONITOR:   Vent status, Labs, Weight trends, Skin, I & O's  REASON FOR ASSESSMENT:   Ventilator    ASSESSMENT:   79 y/o female with h/o IDA, B12 deficiency, Cameron erosions, DM, large hiatal hernia, MDD, HTN, CHF, CAD, HLD, diverticulosis, PUD, breast cancer s/p mastectomy, GERD, pulmonary hypertension and chronic constipation who is admitted with AKI and septic shock secondary to peritonitis, SBO with bowel necrosis and internal hernia now s/p exploratory laparotomy, lysis of adhesions, reduction of internal hernia, small bowel resection (~18-20cm mid bowel) and placement of ABThera negative pressure wound VAC 1/29.  RD unable to see patient today. Pt sedated and ventilated with NGT in place to LIS with output. Would recommend TPN initiation 1/30 if patient remains stable as pt will likely require prolonged NPO. Pt is at high refeed risk. Recommend IV thiamine. Pt with h/o PUD, hiatal hernia and chronic anemia; will check vitamin levels. Per chart, pt appears weight stable at baseline. Pt is up ~12lbs since admit.   Medications reviewed and include: heparin , protonix , miralax , senokot, 10% dextrose  @60ml /hr, linezolid , levophed , neo-synephrine, zosyn , propofol    Labs reviewed: K 4.9 wnl, BUN 40(H), creat 1.61(H) Hgb 11.3(L), Hct 34.8(L) Cbgs- 63, 60, 73, 72 x 24 hrs  AIC 5.6- 08/2023  Patient is currently intubated on ventilator support MV:  L/min Temp (24hrs),  Avg:98.2 F (36.8 C), Min:97.8 F (36.6 C), Max:98.8 F (37.1 C)  Propofol : 7.89 ml/hr- provides 208kcal/day  MAP >47mmHg   UOP-   Drains- 20ml   NUTRITION - FOCUSED PHYSICAL EXAM: Unable to perform at this time   Diet Order:   Diet Order             Diet NPO time specified  Diet effective now                  EDUCATION NEEDS:   Not appropriate for education at this time  Skin:  Skin Assessment: Reviewed RN Assessment (open abdomen with VAC)  Last BM:  pta  Height:   Ht Readings from Last 1 Encounters:  02/19/24 4' 11 (1.499 m)    Weight:   Wt Readings from Last 1 Encounters:  02/19/24 58.1 kg    Ideal Body Weight:  45 kg  BMI:  Body mass index is 25.87 kg/m.  Estimated Nutritional Needs:   Kcal:  1400-1600kcal/day  Protein:  70-80g/day  Fluid:  1.3-1.5L/day  Augustin Shams MS, RD, LDN If unable to be reached, please send secure chat to RD inpatient available from 8:00a-4:00p daily

## 2024-02-19 NOTE — Progress Notes (Signed)
 "  NAME:  Ashley Ryan, MRN:  [id number], DOB:  09-May-1945, LOS: 1 ADMISSION DATE:  02/18/2024, CONSULTATION DATE:  02/18/2024 REFERRING MD:  Dr. Fernand, CHIEF COMPLAINT:  Hypotension    Brief Pt Description / Synopsis:  79 y.o. female admitted with Small Bowel Obstruction and AKI.  Course complicated by development of Septic Shock due to Peritonitis and necrotic small bowel, taken for emergent Laparotomy with lysis of adhesions, reduction of internal hernia, and small bowel resection.  Remains intubated post procedure.    History of Present Illness:  Ashley Ryan is a 79 y.o. 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  Per patient this started 2 days ago but since yesterday she has been progressively getting worse.  States has not had a bowel movement since 2 days. She has had multiple episodes of nausea and vomiting. Given worsening symptoms, they called EMS to get further care   ED Course: Initial Vital Signs: Temperature 97.8 F, RR 26, pulse 128, BP 73/64, SpO2 100% on room air Significant Labs: leukocytosis to 18.7, normal hemoglobin with elevated hematocrit consistent with hemoconcentration. CMP with moderate hyperglycemia, AKI, ALT and AST elevation. Lipase normal. Troponin mildly elevated but stable.  Imaging Chest X-ray>>IMPRESSION: 1. Significant interval enlargement of the hiatal hernia with diffuse gaseous distension of both the intrathoracic and intra-abdominal portions of the stomach. This suggests the possibility of gastric outlet obstruction. Recommend further evaluation with contrast-enhanced CT scan of the abdomen and pelvis. 2. Slightly increased atelectasis in the right lung surrounding the enlarging hiatal hernia. Otherwise, no acute cardiopulmonary process. CT Abdomen &  Pelvis>>IMPRESSION: 1. High grade small bowel obstruction with obstruction favored to be in the right lower quadrant slash right pelvis . 2. Small amount of free fluid and hazy mesentery within the distal small bowel and along the left pericardial gutter related to small bowel obstruction and venous congestion . 3. No pneumatosis or portal venous gas.no intraperitoneal free air. . 4. Very large hiatal hernia with half the stomach above the hemidiaphragm and markedly distended . Medications Administered: 2.5 L LR boluses, Zosyn , Zofran  and Morphine    She met SIRS criteria, therefore sepsis workup initiated and Antibiotics given. [facility] asked to admit for further workup and treatment.  General Surgery was consulted   Please see Significant Hospital Events section below for full detailed hospital course.   Pertinent  Medical History   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  Mastectomy/ SLN.   Chest pain    a. 10/2017 Cath: Nl cors.   Chronic headache    Chronic heart failure with preserved ejection fraction (HFpEF) (HCC)    a. 10/2021 Echo: EF 60-65%, no rwma, mild LVH, GrI DD, nl RV size/fxn, mild MR.   Complication of anesthesia    prior to 1991 used to have PONV.  none recently.   Diverticulosis of colon    Family history of adverse reaction to anesthesia    mother/daighter get sick   GERD with stricture    Hard of hearing    Hiatal hernia    Hypokalemia    Labile hypertension    a. 10/2017 Cath: nl renal arteries.   Mitral regurgitation  a. 10/2021 Echo: Mild MR.   Mixed hyperlipidemia    Neuropathy    feet   Osteoporosis    Pre-diabetes    Pulmonary hypertension (HCC)    a. 10/2017 Echo: PASP 35-70mmHg.   Wears dentures    full upper    Micro Data:  1/28: Blood cultures x2>> 1/29: MRSA PCR>>  Antimicrobials:   Anti-infectives (From admission, onward)     Start     Dose/Rate Route Frequency Ordered Stop   02/18/24 2200  [MAR Hold]  piperacillin -tazobactam (ZOSYN ) IVPB 3.375 g        (MAR Hold since Thu 02/19/2024 at 0650.Hold Reason: Transfer to a Procedural area)   3.375 g 12.5 mL/hr over 240 Minutes Intravenous Every 8 hours 02/18/24 1826     02/18/24 1445  piperacillin -tazobactam (ZOSYN ) IVPB 3.375 g        3.375 g 100 mL/hr over 30 Minutes Intravenous  Once 02/18/24 1439 02/18/24 1524       Significant Hospital Events: Including procedures, antibiotic start and stop dates in addition to other pertinent events   1/28: Admitted by [facility] for SBO and AKI.  General Surgery consulted.  BP soft, PCCM consulted for potential vasopressors.  1/29: This morning with worsening lactic acidosis and hypotension requiring initiation of Levophed , CT with increase in edema and fluid along mesentery concerning for Peritonitis and necrotic small bowel.  Taken for emergent laparotomy with lysis of adhesions, reduction of internal hernia, small bowel resection, and placement of wound VAC  Remains intubated post procedure, tentative plan to return to OR tomorrow.   Interim History / Subjective:  As outlined above under Significant Hospital Events section  Objective   Blood pressure 114/69, pulse (!) 129, temperature 98 F (36.7 C), temperature source Oral, resp. rate (!) 21, weight 52.6 kg, SpO2 94%.        Intake/Output Summary (Last 24 hours) at 02/19/2024 0732 Last data filed at 02/19/2024 9377 Gross per 24 hour  Intake 637.45 ml  Output 2750 ml  Net -2112.55 ml   Filed Weights   02/18/24 1800  Weight: 52.6 kg    Examination: General: Critically ill appearing elderly female, laying in bed, intubated and sedated, in NAD HENT: Atraumatic, normocephalic, neck supple, no JVD, orally intubated Lungs: Mechanical breath sounds throughout, even, nonlabored, synchronous with vent Cardiovascular: Tachycardia, regular rhythm, s1s2, no M/R/G Abdomen: Wound  vac in place to midline, Extremities: 1+ edema BLE, no cyanosis, good peripheral perfusion Neuro: Sedated, currently does not follow commands, PERRL GU: foley catheter in place   Resolved Hospital Problem list     Assessment & Plan:   #Septic Shock #HFpEF without acute exacerbation #Mildly elevated Troponin due to demand ischemia PMHx: HTN, HLD, CAD -Continuous cardiac monitoring -Maintain MAP >65 -IV fluids -Follow CVP to assess need for further fluid resuscitation -IV Albumin  50 g x1 dose  -Vasopressors as needed to maintain MAP goal  -Trend lactic acid until normalized -HS Troponin peaked at 69 -Echocardiogram pending   #Post-op Ventilator Management -Full vent support, implement lung protective strategies -Plateau pressures less than 30 cm H20 -Wean FiO2 & PEEP as tolerated to maintain O2 sats >92% -Follow intermittent Chest X-ray & ABG as needed -Spontaneous Breathing Trials when respiratory parameters met and mental status permits -Implement VAP Bundle -Prn Bronchodilators  #Meets SIRS Criteria on presentation (WBC 18.7, RR 31, Pulse 121) #Severe Sepsis due to ... #Peritonitis and Small Bowel Necrosis -Monitor fever curve -Trend WBC's & Procalcitonin -Follow cultures as above -Continue empiric  Zosyn  pending cultures & sensitivities  #Small Bowel Obstruction complicated by Small Bowel Necrosis  #Large Hiatal Hernia -NPO -NGT to LIS -General Surgery following, appreciate input ~ take for emergent laparotomy on 1/29 with lysis of adhesions, reduction of internal hernia, small bowel resection, and placement of wound VAC -Tentative plan to return to OR on 1/30  #Acute Kidney Injury #Hyperkalemia ~ RESOLVED #Anion Gap Metabolic Acidosis due to lactic acidosis  -Monitor I&O's / urinary output -Follow BMP -Ensure adequate renal perfusion -Avoid nephrotoxic agents as able -Replace electrolytes as indicated ~ Pharmacy following for assistance with electrolyte  replacement -IV fluids  -Trend lactic   #Shock Liver -Trend LFT's and coags -Avoid hepatoxic agents   #Diabetes Mellitus Type II -CBG's q4h; Target range of 140 to 180 -SSI -Follow ICU Hypo/Hyperglycemia protocol  #Acute Metabolic Encephalopathy #Sedation needs in setting of mechanical ventilation  -Maintain a RASS goal of 0 to -1 -Fentanyl  and Propofol  to maintain RASS goal -Avoid sedating medications as able -Daily wake up assessment       Best Practice (right click and Reselect all SmartList Selections daily)   Diet/type: NPO DVT prophylaxis: SCD GI prophylaxis: PPI Lines: central line and arterial lines, both still needed Foley:  yes, and is still needed Code Status:  full code Last date of multidisciplinary goals of care discussion [1/29]  1/29: Pt's daughter updated at bedside on plan of care.  Labs   CBC: Recent Labs  Lab 02/18/24 1417 02/19/24 0413  WBC 18.7* 16.9*  HGB 13.5 12.7  HCT 43.7 41.0  MCV 85.7 84.7  PLT 235 203    Basic Metabolic Panel: Recent Labs  Lab 02/18/24 1417 02/18/24 1627 02/19/24 0450  NA 141  --  137  K 3.7  --  5.4*  CL 99  --  99  CO2 23  --  18*  GLUCOSE 143*  --  133*  BUN 26*  --  37*  CREATININE 1.51*  --  1.77*  CALCIUM  10.1  --  8.8*  MG  --  4.0*  --    GFR: Estimated Creatinine Clearance: 18.8 mL/min (A) (by C-G formula based on SCr of 1.77 mg/dL (H)). Recent Labs  Lab 02/18/24 1417 02/18/24 1627 02/18/24 1948 02/18/24 2057 02/19/24 0346 02/19/24 0413  WBC 18.7*  --   --   --   --  16.9*  LATICACIDVEN >9.0* 7.9* 5.1* 5.4* 7.3*  --     Liver Function Tests: Recent Labs  Lab 02/18/24 1417 02/19/24 0450  AST 64* 158*  ALT 64* 157*  ALKPHOS 80 62  BILITOT 0.6 1.0  PROT 6.3* 4.9*  ALBUMIN  4.0 2.9*   Recent Labs  Lab 02/18/24 1417  LIPASE 17   No results for input(s): AMMONIA in the last 168 hours.  ABG    Component Value Date/Time   HCO3 24.8 02/19/2024 0434   ACIDBASEDEF 1.3  02/19/2024 0434   O2SAT PENDING 02/19/2024 0434     Coagulation Profile: No results for input(s): INR, PROTIME in the last 168 hours.  Cardiac Enzymes: No results for input(s): CKTOTAL, CKMB, CKMBINDEX, TROPONINI in the last 168 hours.  HbA1C: Hemoglobin A1C  Date/Time Value Ref Range Status  09/16/2023 08:37 AM 5.6 4.0 - 5.6 % Final   Hgb A1c MFr Bld  Date/Time Value Ref Range Status  02/18/2023 08:26 AM 6.0 4.6 - 6.5 % Final    Comment:    Glycemic Control Guidelines for People with Diabetes:Non Diabetic:  <6%Goal of Therapy: <7%Additional Action  Suggested:  >8%   02/12/2022 07:36 AM 6.2 4.6 - 6.5 % Final    Comment:    Glycemic Control Guidelines for People with Diabetes:Non Diabetic:  <6%Goal of Therapy: <7%Additional Action Suggested:  >8%     CBG: Recent Labs  Lab 02/18/24 2128 02/19/24 0042 02/19/24 0434 02/19/24 0504 02/19/24 0704  GLUCAP 86 74 55* 78 72    Review of Systems:   Unable to assess due to intubation/sedation/critical illness   Past Medical History:  She,  has a past medical history of Anemia, Arthritis, Breast cancer (HCC) (06/13/2017), Chest pain, Chronic headache, Chronic heart failure with preserved ejection fraction (HFpEF) (HCC), Complication of anesthesia, Diverticulosis of colon, Family history of adverse reaction to anesthesia, GERD with stricture, Hard of hearing, Hiatal hernia, Hypokalemia, Labile hypertension, Mitral regurgitation, Mixed hyperlipidemia, Neuropathy, Osteoporosis, Pre-diabetes, Pulmonary hypertension (HCC), and Wears dentures.   Surgical History:   Past Surgical History:  Procedure Laterality Date   ABDOMINAL HYSTERECTOMY  1978   one ovary remains   BREAST BIOPSY Left 05/26/2017   Affirm Bx- coil clip Ductal carcinoma in situ, high nuclear grade with calcifications and focal comedonecrosis   CARDIAC CATHETERIZATION     yrs ago all OK.   CATARACT EXTRACTION W/PHACO Left 02/21/2016   Procedure: CATARACT  EXTRACTION PHACO AND INTRAOCULAR LENS PLACEMENT (IOC)  Left eye;  Surgeon: Dene Etienne, MD;  Location: [facility];  Service: Ophthalmology;  Laterality: Left;  Left eye Diabetic   CATARACT EXTRACTION W/PHACO Right 03/13/2016   Procedure: CATARACT EXTRACTION PHACO AND INTRAOCULAR LENS PLACEMENT (IOC)  right  diabetic;  Surgeon: Dene Etienne, MD;  Location: [facility];  Service: Ophthalmology;  Laterality: Right;  diabetic   CHOLECYSTECTOMY  1978   COLONOSCOPY N/A 11/05/2023   Procedure: COLONOSCOPY;  Surgeon: Unk Corinn Skiff, MD;  Location: [facility] END[facility]COPY;  Service: Gastroenterology;  Laterality: N/A;   COLONOSCOPY W/ POLYPECTOMY  09/09/2013   8 mm tubular adenoma of the cecum.  Lamar Aho, MD. [facility]   COLONOSCOPY WITH PROPOFOL  N/A 06/04/2019   Procedure: COLONOSCOPY WITH PROPOFOL ;  Surgeon: Jinny Carmine, MD;  Location: [facility] ENDOSCOPY;  Service: Endoscopy;  Laterality: N/A;   ESOPHAGOGASTRODUODENOSCOPY N/A 11/05/2023   Procedure: EGD (ESOPHAGOGASTRODUODENOSCOPY);  Surgeon: Unk Corinn Skiff, MD;  Location: [facility] ENDOSCOPY;  Service: Gastroenterology;  Laterality: N/A;   ESOPHAGOGASTRODUODENOSCOPY (EGD) WITH PROPOFOL  N/A 06/04/2019   Procedure: ESOPHAGOGASTRODUODENOSCOPY (EGD) WITH PROPOFOL ;  Surgeon: Jinny Carmine, MD;  Location: [facility] ENDOSCOPY;  Service: Endoscopy;  Laterality: N/A;   JOINT REPLACEMENT Left    knee    LEFT HEART CATH AND CORONARY ANGIOGRAPHY N/A 11/06/2017   Procedure: LEFT HEART CATH AND CORONARY ANGIOGRAPHY;  Surgeon: Mady Bruckner, MD;  Location: [facility] INVASIVE CV LAB;  Service: Cardiovascular;  Laterality: N/A;   MASTECTOMY Left 06/13/2017   mastectomy neg margins   OOPHORECTOMY     one still left   RENAL ANGIOGRAPHY  11/06/2017   Procedure: RENAL ANGIOGRAPHY;  Surgeon: Mady Bruckner, MD;  Location: [facility] INVASIVE CV LAB;  Service: Cardiovascular;;   SENTINEL NODE BIOPSY Left 06/13/2017   Procedure: SENTINEL NODE BIOPSY;   Surgeon: Dessa Reyes ORN, MD;  Location: [facility] ORS;  Service: General;  Laterality: Left;   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  Surgeon: Dessa Reyes ORN, MD;  Location: [facility] ORS;  Service: General;  Laterality: Left;     Social History:   reports that she has never smoked. She  has never used smokeless tobacco. She reports that she does not drink alcohol and does not use drugs.   Family History:  Her family history includes Breast cancer in an other family member; Breast cancer (age of onset: 49) in her sister; Coronary artery disease (age of onset: 53) in her father; Diabetes in an other family member; Esophageal cancer in her brother; Heart attack (age of onset: 25) in her mother; Heart disease in an other family member; Hypertension in her father and mother; Stomach cancer in an other family member; Stroke in her father.   Allergies Allergies[1]   Home Medications  Prior to Admission medications  Medication Sig Start Date End Date Taking? Authorizing Provider  amLODipine  (NORVASC ) 5 MG tablet Take 1 tablet (5 mg total) by mouth daily. 01/28/24  Yes Bedsole, Amy E, MD  atorvastatin  (LIPITOR) 40 MG tablet Take 1 tablet (40 mg total) by mouth at bedtime. 01/23/24  Yes Bedsole, Amy E, MD  brimonidine  (ALPHAGAN ) 0.2 % ophthalmic solution brimonidine  0.2 % eye drops  INSTILL 1 DROP INTO BOTH EYES TWICE A DAY   Yes [provider]  busPIRone  (BUSPAR ) 5 MG tablet Take 1 tablet (5 mg total) by mouth 2 (two) times daily. 01/23/24  Yes Bedsole, Amy E, MD  Calcium  Carb-Cholecalciferol  (CALCIUM  600 + D) 600-5 MG-MCG TABS Take 2 tablets by mouth daily. 09/12/21  Yes Babara Call, MD  diclofenac  (VOLTAREN ) 75 MG EC tablet Take 1 tablet (75 mg total) by mouth 2 (two) times daily. 01/23/24  Yes Bedsole, Amy E, MD  diclofenac  Sodium (VOLTAREN ) 1 % GEL APPLY 2 GRAMS TOPICALLY TO THE AFFECTED AREA 4 TIMES DAILY. 06/25/21  Yes  Bedsole, Amy E, MD  esomeprazole  (NEXIUM ) 40 MG capsule TAKE 1 CAPSULE TWICE DAILY BEFORE MEALS 01/23/24  Yes Bedsole, Amy E, MD  fluticasone  (FLONASE ) 50 MCG/ACT nasal spray USE 2 SPRAYS INTO THE NOSE DAILY AS DIRECTED (SUBSTITUTED FOR FLONASE ) 01/23/24  Yes Bedsole, Amy E, MD  furosemide  (LASIX ) 20 MG tablet TAKE 1 TABLET BY MOUTH EVERY DAY AS NEEDED 01/15/18  Yes Bedsole, Amy E, MD  gabapentin  (NEURONTIN ) 100 MG capsule TAKE 1 CAPSULE EVERY MORNING , 1 CAPSULE AT LUNCH, 1 CAPSULE AT DINNER, AND 2-3 CAPSULES AT BEDTIME 01/28/24  Yes Bedsole, Amy E, MD  Glucosamine 500 MG TABS Take 500 mg by mouth daily.   Yes [provider]  isosorbide  mononitrate (IMDUR ) 30 MG 24 hr tablet Take 1 tablet (30 mg total) by mouth daily. 01/23/24  Yes Bedsole, Amy E, MD  latanoprost  (XALATAN ) 0.005 % ophthalmic solution Place 1 drop into both eyes at bedtime.   Yes [provider]  lidocaine  (LIDODERM ) 5 % APPLY 1 PATCH ONTO THE SKIN EVERY DAY. REMOVE AND DISCARD PATCH WITHIN 12 HOURS OR AS DIRECTED BY PHYSICIAN. 01/23/24  Yes Bedsole, Amy E, MD  mirabegron  ER (MYRBETRIQ ) 25 MG TB24 tablet Take 1 tablet (25 mg total) by mouth daily. 01/23/24  Yes Bedsole, Amy E, MD  Omega-3 Fatty Acids (FISH OIL) 1000 MG CAPS Take 1,000 mg by mouth daily.    Yes [provider]  polycarbophil (FIBERCON) 625 MG tablet Take 625 mg by mouth daily.   Yes [provider]  potassium chloride  SA (KLOR-CON  M) 20 MEQ tablet Take 1 tablet (20 mEq total) by mouth 2 (two) times daily. 01/28/24  Yes Bedsole, Amy E, MD  traMADol  (ULTRAM ) 50 MG tablet Take 1 tablet (50 mg total) by mouth every 6 (six) hours as needed. for  pain 01/28/24  Yes Bedsole, Amy E, MD  Alcohol Swabs (DROPSAFE ALCOHOL PREP) 70 % PADS USE  TO TEST BLOOD SUGAR ONE TIME DAILY 06/25/21   Avelina, Amy E, MD  Blood Glucose Calibration (ACCU-CHEK AVIVA) SOLN Use to check blood sugar once daily 10/27/18   Bedsole, Amy E, MD  Blood Glucose Calibration (TRUE METRIX  LEVEL 1) Low SOLN USE AS DIRECTED 06/12/20   Bedsole, Amy E, MD  Blood Glucose Monitoring Suppl (TRUE METRIX AIR GLUCOSE METER) w/Device KIT Use to check blood sugar daily 04/30/21   Bedsole, Amy E, MD  glucose blood (TRUE METRIX BLOOD GLUCOSE TEST) test strip Use to check blood sugar daily 04/30/21   Bedsole, Amy E, MD  Iron -Vitamin C  65-125 MG TABS Take 1 tablet by mouth daily at 2 PM. Patient not taking: Reported on 02/18/2024 03/15/22   Babara Call, MD  losartan -hydrochlorothiazide  (HYZAAR) 100-25 MG tablet Take 1 tablet by mouth daily. Patient not taking: Reported on 02/18/2024    [provider]  triamcinolone  cream (KENALOG ) 0.5 % Apply 1 application topically 2 (two) times daily. Patient not taking: Reported on 02/18/2024 08/12/19   Avelina Greig BRAVO, MD  TRUEplus Lancets 28G MISC TEST BLOOD SUGAR EVERY DAY 06/28/21   Avelina Greig BRAVO, MD     Critical Care time: 45 minutes     Inge Lecher, AGACNP-BC [facility] Prefer epic messenger for cross cover needs If after hours, please call E-link        [1]  Allergies Allergen Reactions   Oxycodone  Nausea Only   Sulfa Antibiotics Rash    Childhood reaction   Sulfonamide Derivatives Rash    Childhood reaction.   "

## 2024-02-19 NOTE — Anesthesia Procedure Notes (Signed)
 Procedure Name: Intubation Date/Time: 02/19/2024 7:24 AM  Performed by: Governor Selinda NOVAK, CRNAPre-anesthesia Checklist: Patient identified, Emergency Drugs available, Suction available and Patient being monitored Patient Re-evaluated:Patient Re-evaluated prior to induction Oxygen Delivery Method: Circle system utilized Preoxygenation: Pre-oxygenation with 100% oxygen Induction Type: IV induction Ventilation: Mask ventilation without difficulty Laryngoscope Size: McGrath, Mac and 3 Tube type: Oral Tube size: 7.0 mm Number of attempts: 1 Airway Equipment and Method: Stylet and Oral airway Placement Confirmation: ETT inserted through vocal cords under direct vision, positive ETCO2 and breath sounds checked- equal and bilateral Secured at: 20 cm Tube secured with: Tape Dental Injury: Teeth and Oropharynx as per pre-operative assessment

## 2024-02-19 NOTE — Progress Notes (Signed)
 OT Cancellation Note  Patient Details Name: Ashley Ryan MRN: [id number] DOB: [birth year]   Cancelled Treatment:    Reason Eval/Treat Not Completed: Medical issues which prohibited therapy. Order received, chart reviewed. Pt emergently taken to OR this AM for exploratory laparotomy, possible bowel resection, possible need to keep patient open with second look laparotomy. OT to sign off at this time. Will require new orders upon finishing procedure as pt is medically stable/appropriate.   Devyne Hauger E Chrismon 02/19/2024, 7:00 AM

## 2024-02-19 NOTE — Progress Notes (Signed)
 PT Cancellation Note  Patient Details Name: Ashley Ryan MRN: [id number] DOB: 1945-04-04   Cancelled Treatment:    Reason Eval/Treat Not Completed: Patient not medically ready: Order received, chart reviewed. Pt emergently taken to OR this AM for exploratory laparotomy, possible bowel resection, possible need to keep patient open with second look laparotomy. PT to sign off at this time. Will require new orders when/if pt is medically stable/appropriate to participate with PT services.    CHARM Glendia Bertin PT, DPT 02/19/24, 11:19 AM

## 2024-02-19 NOTE — Progress Notes (Signed)
 This morning patient acutely worsened with increase in lactic acid after fluid resuscitation. She became hypotensive and remained tachycardic. CT scan repeated with increase in edema and fluid along mesentary. On exam patient with worse mental status, does awaken to voice. Abdomen is more distended and tender to palpation, has progressed to peritonitis. Now on 16 of levophed . WILl need to proceed to operating room emergently. I called and discussed the r/b/a of the procedure with the patient's daughter Ms. Danette Clapp. I discussed exploratory laparotomy, possible bowel resection, possible need to keep patient open with second look laparotomy, risk of infection, bleeding, damage to bowel and death. Also disscussed likely need for remaining intubated post-operatively. She understands these risks and wishes to proceed. Patient getting another 500cc bolus at this time.   Jayson Endow, M.D. [facility]

## 2024-02-20 ENCOUNTER — Inpatient Hospital Stay

## 2024-02-20 ENCOUNTER — Other Ambulatory Visit: Payer: Self-pay

## 2024-02-20 ENCOUNTER — Encounter: Payer: Self-pay | Admitting: General Surgery

## 2024-02-20 ENCOUNTER — Inpatient Hospital Stay: Payer: Self-pay | Admitting: Certified Registered"

## 2024-02-20 ENCOUNTER — Inpatient Hospital Stay: Admit: 2024-02-20 | Discharge: 2024-02-20 | Disposition: A | Attending: Pulmonary Disease

## 2024-02-20 ENCOUNTER — Encounter: Admission: EM | Payer: Self-pay | Source: Home / Self Care | Attending: Pulmonary Disease

## 2024-02-20 DIAGNOSIS — E162 Hypoglycemia, unspecified: Secondary | ICD-10-CM | POA: Diagnosis not present

## 2024-02-20 DIAGNOSIS — R578 Other shock: Secondary | ICD-10-CM

## 2024-02-20 DIAGNOSIS — A419 Sepsis, unspecified organism: Secondary | ICD-10-CM | POA: Diagnosis not present

## 2024-02-20 DIAGNOSIS — R6521 Severe sepsis with septic shock: Secondary | ICD-10-CM | POA: Diagnosis not present

## 2024-02-20 DIAGNOSIS — N179 Acute kidney failure, unspecified: Secondary | ICD-10-CM | POA: Diagnosis not present

## 2024-02-20 DIAGNOSIS — G929 Unspecified toxic encephalopathy: Secondary | ICD-10-CM | POA: Diagnosis not present

## 2024-02-20 DIAGNOSIS — K56609 Unspecified intestinal obstruction, unspecified as to partial versus complete obstruction: Secondary | ICD-10-CM | POA: Diagnosis not present

## 2024-02-20 LAB — BASIC METABOLIC PANEL WITH GFR
Anion gap: 17 — ABNORMAL HIGH (ref 5–15)
BUN: 28 mg/dL — ABNORMAL HIGH (ref 8–23)
CO2: 19 mmol/L — ABNORMAL LOW (ref 22–32)
Calcium: 7.4 mg/dL — ABNORMAL LOW (ref 8.9–10.3)
Chloride: 101 mmol/L (ref 98–111)
Creatinine, Ser: 1.12 mg/dL — ABNORMAL HIGH (ref 0.44–1.00)
GFR, Estimated: 49 mL/min — ABNORMAL LOW
Glucose, Bld: 74 mg/dL (ref 70–99)
Potassium: 4.7 mmol/L (ref 3.5–5.1)
Sodium: 137 mmol/L (ref 135–145)

## 2024-02-20 LAB — GLUCOSE, CAPILLARY
Glucose-Capillary: 73 mg/dL (ref 70–99)
Glucose-Capillary: 106 mg/dL — ABNORMAL HIGH (ref 70–99)
Glucose-Capillary: 64 mg/dL — ABNORMAL LOW (ref 70–99)
Glucose-Capillary: 85 mg/dL (ref 70–99)
Glucose-Capillary: 72 mg/dL (ref 70–99)
Glucose-Capillary: 20 mg/dL — CL (ref 70–99)
Glucose-Capillary: 37 mg/dL — CL (ref 70–99)
Glucose-Capillary: 45 mg/dL — ABNORMAL LOW (ref 70–99)
Glucose-Capillary: 180 mg/dL — ABNORMAL HIGH (ref 70–99)
Glucose-Capillary: 185 mg/dL — ABNORMAL HIGH (ref 70–99)
Glucose-Capillary: 187 mg/dL — ABNORMAL HIGH (ref 70–99)
Glucose-Capillary: 189 mg/dL — ABNORMAL HIGH (ref 70–99)
Glucose-Capillary: 132 mg/dL — ABNORMAL HIGH (ref 70–99)
Glucose-Capillary: 135 mg/dL — ABNORMAL HIGH (ref 70–99)
Glucose-Capillary: 92 mg/dL (ref 70–99)

## 2024-02-20 LAB — CBC
HCT: 27.2 % — ABNORMAL LOW (ref 36.0–46.0)
HCT: 33.8 % — ABNORMAL LOW (ref 36.0–46.0)
HCT: 35.5 % — ABNORMAL LOW (ref 36.0–46.0)
Hemoglobin: 8.4 g/dL — ABNORMAL LOW (ref 12.0–15.0)
Hemoglobin: 10.7 g/dL — ABNORMAL LOW (ref 12.0–15.0)
Hemoglobin: 11.2 g/dL — ABNORMAL LOW (ref 12.0–15.0)
MCH: 26.1 pg (ref 26.0–34.0)
MCH: 26.7 pg (ref 26.0–34.0)
MCH: 26.9 pg (ref 26.0–34.0)
MCHC: 30.9 g/dL (ref 30.0–36.0)
MCHC: 31.7 g/dL (ref 30.0–36.0)
MCHC: 31.5 g/dL (ref 30.0–36.0)
MCV: 84.5 fL (ref 80.0–100.0)
MCV: 84.3 fL (ref 80.0–100.0)
MCV: 85.1 fL (ref 80.0–100.0)
Platelets: 112 10*3/uL — ABNORMAL LOW (ref 150–400)
Platelets: 99 10*3/uL — ABNORMAL LOW (ref 150–400)
Platelets: 101 10*3/uL — ABNORMAL LOW (ref 150–400)
RBC: 3.22 MIL/uL — ABNORMAL LOW (ref 3.87–5.11)
RBC: 4.01 MIL/uL (ref 3.87–5.11)
RBC: 4.17 MIL/uL (ref 3.87–5.11)
RDW: 18.1 % — ABNORMAL HIGH (ref 11.5–15.5)
RDW: 17.5 % — ABNORMAL HIGH (ref 11.5–15.5)
RDW: 18 % — ABNORMAL HIGH (ref 11.5–15.5)
WBC: 4 10*3/uL (ref 4.0–10.5)
WBC: 4.7 10*3/uL (ref 4.0–10.5)
WBC: 5.5 10*3/uL (ref 4.0–10.5)
nRBC: 0 % (ref 0.0–0.2)
nRBC: 0 % (ref 0.0–0.2)
nRBC: 0.5 % — ABNORMAL HIGH (ref 0.0–0.2)

## 2024-02-20 LAB — LACTIC ACID, PLASMA
Lactic Acid, Venous: 4.1 mmol/L (ref 0.5–1.9)
Lactic Acid, Venous: 6.3 mmol/L (ref 0.5–1.9)
Lactic Acid, Venous: 8.3 mmol/L (ref 0.5–1.9)

## 2024-02-20 LAB — COMPREHENSIVE METABOLIC PANEL WITH GFR
ALT: 1270 U/L — ABNORMAL HIGH (ref 0–44)
AST: 1955 U/L — ABNORMAL HIGH (ref 15–41)
Albumin: 3.2 g/dL — ABNORMAL LOW (ref 3.5–5.0)
Alkaline Phosphatase: 75 U/L (ref 38–126)
Anion gap: 13 (ref 5–15)
BUN: 35 mg/dL — ABNORMAL HIGH (ref 8–23)
CO2: 25 mmol/L (ref 22–32)
Calcium: 7.7 mg/dL — ABNORMAL LOW (ref 8.9–10.3)
Chloride: 99 mmol/L (ref 98–111)
Creatinine, Ser: 1.29 mg/dL — ABNORMAL HIGH (ref 0.44–1.00)
GFR, Estimated: 42 mL/min — ABNORMAL LOW
Glucose, Bld: 97 mg/dL (ref 70–99)
Potassium: 3.4 mmol/L — ABNORMAL LOW (ref 3.5–5.1)
Sodium: 136 mmol/L (ref 135–145)
Total Bilirubin: 1.4 mg/dL — ABNORMAL HIGH (ref 0.0–1.2)
Total Protein: 4.1 g/dL — ABNORMAL LOW (ref 6.5–8.1)

## 2024-02-20 LAB — PROTIME-INR
INR: 2 — ABNORMAL HIGH (ref 0.8–1.2)
INR: 1.7 — ABNORMAL HIGH (ref 0.8–1.2)
Prothrombin Time: 24 s — ABNORMAL HIGH (ref 11.4–15.2)
Prothrombin Time: 20.8 s — ABNORMAL HIGH (ref 11.4–15.2)

## 2024-02-20 LAB — ECHOCARDIOGRAM COMPLETE
AR max vel: 1.49 cm2
AV Area VTI: 1.43 cm2
AV Area mean vel: 1.47 cm2
AV Mean grad: 5 mmHg
AV Peak grad: 9.5 mmHg
Ao pk vel: 1.54 m/s
Area-P 1/2: 4.71 cm2
Calc EF: 69.9 %
Height: 59 in
S' Lateral: 1.8 cm
Single Plane A2C EF: 67.5 %
Single Plane A4C EF: 75.4 %
Weight: 2261.04 [oz_av]

## 2024-02-20 LAB — BLOOD GAS, ARTERIAL
Acid-Base Excess: 0.7 mmol/L (ref 0.0–2.0)
Bicarbonate: 25.4 mmol/L (ref 20.0–28.0)
FIO2: 40 %
MECHVT: 400 mL
Mechanical Rate: 16
O2 Saturation: 100 %
PEEP: 5 cmH2O
Patient temperature: 37
pCO2 arterial: 40 mmHg (ref 32–48)
pH, Arterial: 7.41 (ref 7.35–7.45)
pO2, Arterial: 141 mmHg — ABNORMAL HIGH (ref 83–108)

## 2024-02-20 LAB — MAGNESIUM
Magnesium: 3 mg/dL — ABNORMAL HIGH (ref 1.7–2.4)
Magnesium: 2.6 mg/dL — ABNORMAL HIGH (ref 1.7–2.4)

## 2024-02-20 LAB — PHOSPHORUS
Phosphorus: 4.1 mg/dL (ref 2.5–4.6)
Phosphorus: 5.2 mg/dL — ABNORMAL HIGH (ref 2.5–4.6)

## 2024-02-20 LAB — TRIGLYCERIDES: Triglycerides: 71 mg/dL

## 2024-02-20 MED ORDER — BUPIVACAINE LIPOSOME 1.3 % IJ SUSP
INTRAMUSCULAR | Status: AC
  Filled 2024-02-20: qty 20

## 2024-02-20 MED ORDER — PHENYLEPHRINE 80 MCG/ML (10ML) SYRINGE FOR IV PUSH (FOR BLOOD PRESSURE SUPPORT)
PREFILLED_SYRINGE | INTRAVENOUS | Status: DC | PRN
  Administered 2024-02-20: 80 ug via INTRAVENOUS

## 2024-02-20 MED ORDER — DEXTROSE 50 % IV SOLN
25.0000 g | INTRAVENOUS | Status: AC
  Administered 2024-02-20: 25 g via INTRAVENOUS

## 2024-02-20 MED ORDER — EPHEDRINE 5 MG/ML INJ
INTRAVENOUS | Status: AC
  Filled 2024-02-20: qty 5

## 2024-02-20 MED ORDER — CHLORHEXIDINE GLUCONATE CLOTH 2 % EX PADS
6.0000 | MEDICATED_PAD | Freq: Every evening | CUTANEOUS | Status: AC
  Administered 2024-02-20 – 2024-02-26 (×7): 6 via TOPICAL

## 2024-02-20 MED ORDER — ROCURONIUM BROMIDE 10 MG/ML (PF) SYRINGE
PREFILLED_SYRINGE | INTRAVENOUS | Status: AC
  Filled 2024-02-20: qty 10

## 2024-02-20 MED ORDER — NOREPINEPHRINE 4 MG/250ML-% IV SOLN
INTRAVENOUS | Status: AC
  Filled 2024-02-20: qty 250

## 2024-02-20 MED ORDER — BUPIVACAINE-EPINEPHRINE (PF) 0.5% -1:200000 IJ SOLN
INTRAMUSCULAR | Status: AC
  Filled 2024-02-20: qty 30

## 2024-02-20 MED ORDER — DEXTROSE IN LACTATED RINGERS 5 % IV SOLN: INTRAVENOUS | Status: DC

## 2024-02-20 MED ORDER — PHENYLEPHRINE 80 MCG/ML (10ML) SYRINGE FOR IV PUSH (FOR BLOOD PRESSURE SUPPORT)
PREFILLED_SYRINGE | INTRAVENOUS | Status: AC
  Filled 2024-02-20: qty 10

## 2024-02-20 MED ORDER — 0.9 % SODIUM CHLORIDE (POUR BTL) OPTIME
TOPICAL | Status: DC | PRN
  Administered 2024-02-20: 2000 mL

## 2024-02-20 MED ORDER — SODIUM CHLORIDE (PF) 0.9 % IJ SOLN
INTRAMUSCULAR | Status: AC
  Filled 2024-02-20: qty 40

## 2024-02-20 MED ORDER — DEXTROSE 50 % IV SOLN
INTRAVENOUS | Status: AC
  Filled 2024-02-20: qty 50

## 2024-02-20 MED ORDER — ROCURONIUM BROMIDE 100 MG/10ML IV SOLN
INTRAVENOUS | Status: DC | PRN
  Administered 2024-02-20: 50 mg via INTRAVENOUS
  Administered 2024-02-20: 30 mg via INTRAVENOUS

## 2024-02-20 MED ORDER — SODIUM CHLORIDE (PF) 0.9 % IJ SOLN
INTRAMUSCULAR | Status: DC | PRN
  Administered 2024-02-20: 80 mL

## 2024-02-20 MED ORDER — SODIUM CHLORIDE 0.9% IV SOLUTION: Freq: Once | INTRAVENOUS | Status: DC

## 2024-02-20 MED ORDER — INDOCYANINE GREEN 25 MG IJ SOLR
INTRAMUSCULAR | Status: DC | PRN
  Administered 2024-02-20: 7.5 mg via INTRAVENOUS

## 2024-02-20 MED ORDER — LACTATED RINGERS IV SOLN: INTRAVENOUS | Status: DC | PRN

## 2024-02-20 MED ORDER — POTASSIUM CHLORIDE 10 MEQ/100ML IV SOLN
10.0000 meq | INTRAVENOUS | Status: AC
  Administered 2024-02-20 (×3): 10 meq via INTRAVENOUS
  Filled 2024-02-20 (×3): qty 100

## 2024-02-20 MED ORDER — PROPOFOL 10 MG/ML IV BOLUS
INTRAVENOUS | Status: AC
  Filled 2024-02-20: qty 20

## 2024-02-20 NOTE — Anesthesia Preprocedure Evaluation (Signed)
 "                                  Anesthesia Evaluation  Patient identified by MRN, date of birth, ID band Patient confused and Patient unresponsive  General Assessment Comment:Eyes open spontaneously, no response to commands  Reviewed: Allergy & Precautions, H&P , NPO status , Patient's Chart, lab work & pertinent test results  Airway Mallampati: Intubated      Comment: Pt will not open mouth Dental  (+) Poor Dentition   Pulmonary neg pulmonary ROS   Pulmonary exam normal        Cardiovascular hypertension, (-) angina + CAD and +CHF  (-) Past MI and (-) Cardiac Stents (-) dysrhythmias (-) Valvular Problems/Murmurs Rhythm:Regular Rate:Tachycardia     Neuro/Psych  PSYCHIATRIC DISORDERS Anxiety Depression    negative neurological ROS     GI/Hepatic Neg liver ROS, hiatal hernia, PUD,GERD  ,,  Endo/Other  diabetes    Renal/GU      Musculoskeletal   Abdominal   Peds  Hematology  (+) Blood dyscrasia, anemia   Anesthesia Other Findings Patient intubated and sedated with open belly, coming back for anastomosis and abdominal closure.  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  Mastectomy/              SLN. No date: Chest pain     Comment:  a. 10/2017 Cath: Nl cors. No date: Chronic headache No date: Chronic heart failure with preserved ejection fraction  (HFpEF) (HCC)     Comment:  a. 10/2021 Echo: EF 60-65%, no rwma, mild LVH, GrI DD,               nl RV size/fxn, mild MR. No date: Complication of anesthesia     Comment:  prior to 1991 used to have PONV.  none recently. No date: Diverticulosis of colon No date: Family history of adverse reaction to anesthesia     Comment:  mother/daighter get sick No date: GERD with stricture No date: Hard of hearing No date: Hiatal hernia No date: Hypokalemia No date: Labile hypertension     Comment:  a.  10/2017 Cath: nl renal arteries. No date: Mitral regurgitation     Comment:  a. 10/2021 Echo: Mild MR. No date: Mixed hyperlipidemia No date: Neuropathy     Comment:  feet No date: Osteoporosis No date: Pre-diabetes No date: Pulmonary hypertension (HCC)     Comment:  a. 10/2017 Echo: PASP 35-74mmHg. 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 02/21/2016: CATARACT EXTRACTION W/PHACO; Left     Comment:  Procedure: CATARACT EXTRACTION PHACO AND INTRAOCULAR               LENS PLACEMENT (IOC)  Left eye;  Surgeon: Dene  Brasington, MD;  Location: [facility];  Service:              Ophthalmology;  Laterality: Left;  Left eye Diabetic 03/13/2016: CATARACT EXTRACTION W/PHACO; Right     Comment:  Procedure: CATARACT EXTRACTION PHACO AND INTRAOCULAR               LENS PLACEMENT (IOC)  right  diabetic;  Surgeon: Dene Etienne, MD;  Location: [facility];  Service:              Ophthalmology;  Laterality: Right;  diabetic 1978: CHOLECYSTECTOMY 11/05/2023: COLONOSCOPY; N/A     Comment:  Procedure: COLONOSCOPY;  Surgeon: Unk Corinn Skiff,               MD;  Location: [facility] END[facility]COPY;  Service:               Gastroenterology;  Laterality: N/A; 09/09/2013: COLONOSCOPY W/ POLYPECTOMY     Comment:  8 mm tubular adenoma of the cecum.  Lamar Aho, MD.               [facility] 06/04/2019: COLONOSCOPY WITH PROPOFOL ; N/A     Comment:  Procedure: COLONOSCOPY WITH PROPOFOL ;  Surgeon: Jinny Carmine, MD;  Location: [facility] ENDOSCOPY;  Service:               Endoscopy;  Laterality: N/A; 11/05/2023:  ESOPHAGOGASTRODUODENOSCOPY; N/A     Comment:  Procedure: EGD (ESOPHAGOGASTRODUODENOSCOPY);  Surgeon:               Unk Corinn Skiff, MD;  Location: [facility] ENDOSCOPY;                Service: Gastroenterology;  Laterality: N/A; 06/04/2019: ESOPHAGOGASTRODUODENOSCOPY (EGD) WITH PROPOFOL ; N/A     Comment:  Procedure: ESOPHAGOGASTRODUODENOSCOPY (EGD) WITH               PROPOFOL ;  Surgeon: Jinny Carmine, MD;  Location: [facility]               ENDOSCOPY;  Service: Endoscopy;  Laterality: N/A; No date: JOINT REPLACEMENT; Left     Comment:  knee  11/06/2017: LEFT HEART CATH AND CORONARY ANGIOGRAPHY; N/A     Comment:  Procedure: LEFT HEART CATH AND CORONARY ANGIOGRAPHY;                Surgeon: Mady Bruckner, MD;  Location: [facility] INVASIVE               CV LAB;  Service: Cardiovascular;  Laterality: N/A; 06/13/2017: MASTECTOMY; Left     Comment:  mastectomy neg margins No date: OOPHORECTOMY     Comment:  one still left 11/06/2017: RENAL ANGIOGRAPHY     Comment:  Procedure: RENAL ANGIOGRAPHY;  Surgeon: Mady Bruckner, MD;  Location: [facility] INVAS[facility] CV LAB;                Service: Cardiovascular;; 06/13/2017: SENTINEL NODE BIOPSY; Left     Comment:  Procedure: SENTINEL NODE BIOPSY;  Surgeon: Dessa Reyes ORN, MD;  Location: [facility] ORS;  Service: General;                Laterality: Left; 06/13/2017: SIMPLE MASTECTOMY  WITH AXILLARY SENTINEL NODE BIOPSY; Left     Comment:  8 mm high grade DCIS, negative SLN. ER/PR+.  Surgeon:               Dessa Reyes ORN, MD;  Location: [facility] ORS;  Service:               General;  Laterality: Left;  BMI    Body Mass Index: 23.42 kg/m      Reproductive/Obstetrics negative OB ROS                              Anesthesia Physical Anesthesia Plan  ASA: 4  Anesthesia Plan: General   Post-op Pain Management:    Induction: Intravenous  PONV Risk Score and Plan: 2 and Ondansetron , Dexamethasone  and Treatment  may vary due to age or medical condition  Airway Management Planned: Oral ETT  Additional Equipment:   Intra-op Plan:   Post-operative Plan: Post-operative intubation/ventilation  Informed Consent: I have reviewed the patients History and Physical, chart, labs and discussed the procedure including the risks, benefits and alternatives for the proposed anesthesia with the patient or authorized representative who has indicated his/her understanding and acceptance.     Dental Advisory Given and Consent reviewed with POA  Plan Discussed with: CRNA and Surgeon  Anesthesia Plan Comments:          Anesthesia Quick Evaluation  "

## 2024-02-20 NOTE — TOC Progression Note (Signed)
 Transition of Care [facility]) - Progression Note    Patient Details  Name: Ashley Ryan MRN: [id number] Date of Birth: Jan 10, 1946  [facility]) CM/SW Contact  K'La JINNY Ruts, LCSW Phone Number: 02/20/2024, 3:35 PM  Clinical Narrative:    Chart reviewed. The patient is currently intubated. I called the patient spouse and the phone was off. Will try again at a later time.                      Expected Discharge Plan and Services                                               Social Drivers of Health (SDOH) Interventions SDOH Screenings   Food Insecurity: No Food Insecurity (02/19/2024)  Housing: Unknown (02/19/2024)  Transportation Needs: No Transportation Needs (02/19/2024)  Utilities: Not At Risk (02/19/2024)  Alcohol Screen: Low Risk (10/21/2023)  Depression (PHQ2-9): Low Risk (01/28/2024)  Financial Resource Strain: Low Risk (10/21/2023)  Recent Concern: Financial Resource Strain - Medium Risk (09/26/2023)   Received from [facility]  Physical Activity: Patient Declined (10/21/2023)  Recent Concern: Physical Activity - Inactive (09/26/2023)   Received from [facility]  Social Connections: Unknown (02/19/2024)  Stress: No Stress Concern Present (10/21/2023)  Tobacco Use: Low Risk (02/20/2024)  Health Literacy: Adequate Health Literacy (10/21/2023)    Readmission Risk Interventions     No data to display

## 2024-02-20 NOTE — Transfer of Care (Signed)
 Immediate Anesthesia Transfer of Care Note  Patient: Ashley Ryan  Procedure(s) Performed: LAPAROTOMY, EXPLORATORY  Patient Location: ICU  Anesthesia Type:General  Level of Consciousness: sedated  Airway & Oxygen Therapy: Patient remains intubated per anesthesia plan  Post-op Assessment: Report given to RN and Post -op Vital signs reviewed and stable  Post vital signs: Reviewed and stable  Last Vitals:  Vitals Value Taken Time  BP 122/78   Temp 37   Pulse 108   Resp 16   SpO2 97     Last Pain:  Vitals:   02/20/24 1247  TempSrc: Oral  PainSc:          Complications: No notable events documented.

## 2024-02-20 NOTE — Plan of Care (Signed)
  Problem: Tissue Perfusion: Goal: Adequacy of tissue perfusion will improve Outcome: Progressing   Problem: Education: Goal: Knowledge of General Education information will improve Description: Including pain rating scale, medication(s)/side effects and non-pharmacologic comfort measures Outcome: Progressing   Problem: Health Behavior/Discharge Planning: Goal: Ability to manage health-related needs will improve Outcome: Progressing

## 2024-02-20 NOTE — Progress Notes (Signed)
 PIV consult: RUE edematous from shoulder to fingers. No suitable veins found for cannulation. Pt is wearing restricted band on LUE. Hx of simple mastectomy on L. Please consider exchanging central line for triple lumen.

## 2024-02-20 NOTE — Progress Notes (Signed)
 [facility] SURGICAL PROGRESS NOTE  Hospital Day(s): 2.   Post op day(s): 1 Day Post-Op.   Interval History:  Patient seen and examined No acute events or new complaints overnight.  Vasopressors weaning; currently on 9 mcg norepinephrine  WBC 4.0K Hgb to 8.4 sCr - 1.29; UO - 1488 ccs Hypokalemia to 3.4 Venous lactate 4.1 Arterial pH 7.41 INR 2.0 NGT; 400 ccs Abthera  Zosyn   Vital signs in last 24 hours: [min-max] current  Temp:  [97.3 F (36.3 C)-98 F (36.7 C)] 97.9 F (36.6 C) (01/30 0942) Pulse Rate:  [102-141] 103 (01/30 0942) Resp:  [15-21] 17 (01/30 0942) BP: (84-118)/(59-93) 118/72 (01/29 2100) SpO2:  [99 %-100 %] 100 % (01/30 0942) Arterial Line BP: (85-121)/(53-74) 119/53 (01/30 0942) FiO2 (%):  [40 %-45 %] 40 % (01/30 1111) Weight:  [64.1 kg] 64.1 kg (01/30 0500)     Height: 4' 11 (149.9 cm) Weight: 64.1 kg BMI (Calculated): 28.53   Intake/Output last 2 shifts:  01/29 0701 - 01/30 0700 In: 7657.5 [I.V.:5102.7; NG/GT:80; IV Piggyback:2399.8] Out: 2233 [Lmpwz:8511; Emesis/NG output:400; Drains:320; Blood:25]   Physical Exam:  Constitutional: intubated; sedated Respiratory: on ventilator  Cardiovascular: tachycardic 103 bpm, regular Gastrointestinal: soft, non-distended, abthera in place; good seal   Labs:     Latest Ref Rng & Units 02/20/2024    3:45 AM 02/19/2024    8:42 PM 02/19/2024    9:34 AM  CBC  WBC 4.0 - 10.5 K/uL 4.0   9.2   Hemoglobin 12.0 - 15.0 g/dL 8.4  8.6  88.6   Hematocrit 36.0 - 46.0 % 27.2  26.6  34.8   Platelets 150 - 400 K/uL 112   204       Latest Ref Rng & Units 02/20/2024    3:45 AM 02/19/2024    8:42 PM 02/19/2024    9:34 AM  CMP  Glucose 70 - 99 mg/dL 97  894    895  882   BUN 8 - 23 mg/dL 35  41    41  40   Creatinine 0.44 - 1.00 mg/dL 8.70  8.39    8.39  8.38   Sodium 135 - 145 mmol/L 136  137    137  138   Potassium 3.5 - 5.1 mmol/L 3.4  4.0    4.0  4.9   Chloride 98 - 111 mmol/L 99  99    100   99   CO2 22 - 32 mmol/L 25  25    25  25    Calcium  8.9 - 10.3 mg/dL 7.7  7.9    7.9  8.6   Total Protein 6.5 - 8.1 g/dL 4.1  4.4  4.7   Total Bilirubin 0.0 - 1.2 mg/dL 1.4  1.2  1.0   Alkaline Phos 38 - 126 U/L 75  54  53   AST 15 - 41 U/L 1,955  1,244  1,172   ALT 0 - 44 U/L 1,270  1,070  1,009     Imaging studies: No new pertinent imaging studies   Assessment/Plan: 79 y.o. female weaning from vasopressor support 1 Day Post-Op s/p exploratory laparotomy, lysis of adhesions, reduction of internal hernia, small bowel resection, placement of ABThera negative pressure wound VAC   - Plan for return to the OR this afternoon with Dr Marinda for second look, anastomosis, and hopefully abdominal closure. - All risks, benefits, and alternatives to above procedure(s) were discussed with the patient's family, all of their questions were answered to  their expressed satisfaction, patient's family expresses wish to proceed, and informed consent was obtained.    - Appreciate PCCM support; weaning from vasopressor support as feasible   - Continue NGT decompression; monitor and record output   - She will need to continue to be NPO; May need TPN post-operatively   - Monitor abdominal examination   All of the above findings and recommendations were discussed with the patient's family, and the medical team, and all of patient's family's questions were answered to their expressed satisfaction.  -- [name], PA-C [facility] 02/20/2024, 12:01 PM M-F: 7am - 4pm

## 2024-02-20 NOTE — Op Note (Signed)
 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  Initially she was treated nonoperatively but developed peritonitis so she was taken to the OR emergently yesterday for exploratory laparotomy and resection of necrotic small bowel and reduction of internal hernia.  At that time she was on high-dose pressors and anastomosis was not performed.  She was taken back to the ICU and resuscitated overnight.  Her pressor requirements decreased.  Her base deficit resolved and she was appropriate for return to the operating room  After informed consent was obtained the patient was brought to the operating room and transferred to the operating room table and placed in the supine position.  Her abdomen was then prepped and draped in the usual sterile fashion after removing the outermost layer of the ABThera wound VAC system.  A surgical timeout was then called identifying correct patient, site, side and procedure.  The wound VAC was removed.  The bowel was eviscerated.  I identified the ligament of Treitz and ran the bowel distally.  The proximal bowel did seem dilated still but was not ischemic.  The proximal resection point appeared viable.  The distal resection point was dusky.  This was run distally towards the ileocecal valve.  The abdomen was examined and there were no other significant abnormalities.  The colon did seem a little bit dilated.  I was concerned about the distal transection point so the patient was given 3 cc of ICG.  Using a robotic camera I was able to identify the previous resection points.  The distal resection point did not light up well with ICG.  Due to  this I elected to perform a small bowel resection.  In the previous distal resection point there was an area of healthy bowel approximately 5 cm from this.  A mesenteric window was made and the ischemic bowel was stapled off with a 75 mm Endo GIA stapler.  The mesentery was then taken with the LigaSure device.  Next I reexamined the 2 limbs with the robotic camera and both lit up well indicating good perfusion.  The limbs were approximated using a 3-0 Vicryl suture.  The staple lines were then opened and a common channel was made with a 75 mm Endo GIA blue load stapler.  The common channel was then closed with another firing of a 60 the TA stapler.  The mesenteric defect was then closed with 3-0 Vicryl suture.  The bowel was allowed to be placed naturally back into the abdominal cavity.  Some of the omentum was placed over the anastomosis.  The preperitoneal space was then infiltrated with 60 cc of liposomal bupivacaine , half percent Marcaine  and saline solution.  The abdomen was then copiously irrigated with 2 L of saline.  The fascia was then closed with a running 0 PDS from superior and inferior and was tied together in the middle.  The deep dermal layer was then reapproximated using 3-0 Vicryl.  The skin was anesthetized with a further 20 cc of liposomal bupivacaine , half percent Marcaine  and saline solution.  The skin was then closed with staples.  A Prevena wound dressing system was then placed to good seal.  The patient was kept intubated and transferred to the ICU in critical condition.  Prior to termination of the procedure all sponge instrument counts were correct x 2.

## 2024-02-20 NOTE — Anesthesia Postprocedure Evaluation (Signed)
"   Anesthesia Post Note  Patient: Ashley Ryan  Procedure(s) Performed: LAPAROTOMY, EXPLORATORY (Abdomen) APPLICATION, WOUND VAC LAPAROTOMY, FOR LYSIS OF ADHESIONS EXCISION, SMALL INTESTINE  Patient location during evaluation: ICU Anesthesia Type: General Level of consciousness: sedated Pain management: pain level controlled Vital Signs Assessment: post-procedure vital signs reviewed and stable Respiratory status: patient on ventilator - see flowsheet for VS Cardiovascular status: stable Anesthetic complications: no   There were no known notable events for this encounter.   Last Vitals:  Vitals:   02/20/2024 0715 02/20/24 0400  BP: 115/52   Pulse: 101   Resp: 16   Temp:  (!) 36.3 C  SpO2: 100%     Last Pain:  Vitals:   02/20/24 0400  TempSrc: Axillary  PainSc:                  [name]      "

## 2024-02-20 NOTE — Plan of Care (Signed)
" °  Problem: Fluid Volume: Goal: Ability to maintain a balanced intake and output will improve Outcome: Progressing   Problem: Metabolic: Goal: Ability to maintain appropriate glucose levels will improve Outcome: Progressing   Problem: Skin Integrity: Goal: Risk for impaired skin integrity will decrease Outcome: Progressing   Problem: Tissue Perfusion: Goal: Adequacy of tissue perfusion will improve Outcome: Progressing   Problem: Clinical Measurements: Goal: Ability to maintain clinical measurements within normal limits will improve Outcome: Progressing Goal: Diagnostic test results will improve Outcome: Progressing Goal: Cardiovascular complication will be avoided Outcome: Progressing   "

## 2024-02-21 ENCOUNTER — Inpatient Hospital Stay

## 2024-02-21 DIAGNOSIS — R6521 Severe sepsis with septic shock: Secondary | ICD-10-CM | POA: Diagnosis not present

## 2024-02-21 DIAGNOSIS — K56609 Unspecified intestinal obstruction, unspecified as to partial versus complete obstruction: Secondary | ICD-10-CM | POA: Diagnosis not present

## 2024-02-21 DIAGNOSIS — N179 Acute kidney failure, unspecified: Secondary | ICD-10-CM | POA: Diagnosis not present

## 2024-02-21 DIAGNOSIS — A419 Sepsis, unspecified organism: Secondary | ICD-10-CM | POA: Diagnosis not present

## 2024-02-21 LAB — CBC
HCT: 32.6 % — ABNORMAL LOW (ref 36.0–46.0)
Hemoglobin: 10.4 g/dL — ABNORMAL LOW (ref 12.0–15.0)
MCH: 26.7 pg (ref 26.0–34.0)
MCHC: 31.9 g/dL (ref 30.0–36.0)
MCV: 83.6 fL (ref 80.0–100.0)
Platelets: 89 10*3/uL — ABNORMAL LOW (ref 150–400)
RBC: 3.9 MIL/uL (ref 3.87–5.11)
RDW: 18.6 % — ABNORMAL HIGH (ref 11.5–15.5)
WBC: 6.4 10*3/uL (ref 4.0–10.5)
nRBC: 1.1 % — ABNORMAL HIGH (ref 0.0–0.2)

## 2024-02-21 LAB — LACTIC ACID, PLASMA
Lactic Acid, Venous: 6.9 mmol/L (ref 0.5–1.9)
Lactic Acid, Venous: 5.4 mmol/L (ref 0.5–1.9)

## 2024-02-21 LAB — COMPREHENSIVE METABOLIC PANEL WITH GFR
ALT: 780 U/L — ABNORMAL HIGH (ref 0–44)
AST: 558 U/L — ABNORMAL HIGH (ref 15–41)
Albumin: 2.3 g/dL — ABNORMAL LOW (ref 3.5–5.0)
Alkaline Phosphatase: 121 U/L (ref 38–126)
Anion gap: 16 — ABNORMAL HIGH (ref 5–15)
BUN: 28 mg/dL — ABNORMAL HIGH (ref 8–23)
CO2: 19 mmol/L — ABNORMAL LOW (ref 22–32)
Calcium: 6.9 mg/dL — ABNORMAL LOW (ref 8.9–10.3)
Chloride: 101 mmol/L (ref 98–111)
Creatinine, Ser: 1.15 mg/dL — ABNORMAL HIGH (ref 0.44–1.00)
GFR, Estimated: 48 mL/min — ABNORMAL LOW
Glucose, Bld: 68 mg/dL — ABNORMAL LOW (ref 70–99)
Potassium: 4.2 mmol/L (ref 3.5–5.1)
Sodium: 135 mmol/L (ref 135–145)
Total Bilirubin: 2.1 mg/dL — ABNORMAL HIGH (ref 0.0–1.2)
Total Protein: 4 g/dL — ABNORMAL LOW (ref 6.5–8.1)

## 2024-02-21 LAB — GLUCOSE, CAPILLARY
Glucose-Capillary: 74 mg/dL (ref 70–99)
Glucose-Capillary: 159 mg/dL — ABNORMAL HIGH (ref 70–99)
Glucose-Capillary: 17 mg/dL — CL (ref 70–99)
Glucose-Capillary: 66 mg/dL — ABNORMAL LOW (ref 70–99)
Glucose-Capillary: 66 mg/dL — ABNORMAL LOW (ref 70–99)
Glucose-Capillary: 100 mg/dL — ABNORMAL HIGH (ref 70–99)
Glucose-Capillary: 152 mg/dL — ABNORMAL HIGH (ref 70–99)
Glucose-Capillary: 94 mg/dL (ref 70–99)
Glucose-Capillary: 93 mg/dL (ref 70–99)
Glucose-Capillary: 128 mg/dL — ABNORMAL HIGH (ref 70–99)
Glucose-Capillary: 106 mg/dL — ABNORMAL HIGH (ref 70–99)

## 2024-02-21 LAB — MAGNESIUM: Magnesium: 2.4 mg/dL (ref 1.7–2.4)

## 2024-02-21 LAB — BPAM FFP
Blood Product Expiration Date: 202602042359
ISSUE DATE / TIME: 202601301638
Unit Type and Rh: 6200

## 2024-02-21 LAB — COPPER, SERUM: Copper: 99 ug/dL (ref 76–142)

## 2024-02-21 LAB — PREPARE FRESH FROZEN PLASMA

## 2024-02-21 LAB — PHOSPHORUS: Phosphorus: 4.6 mg/dL (ref 2.5–4.6)

## 2024-02-21 MED ORDER — TRACE MINERALS CU-MN-SE-ZN 300-55-60-3000 MCG/ML IV SOLN
INTRAVENOUS | Status: AC
  Filled 2024-02-21: qty 268.8

## 2024-02-21 MED ORDER — HYDROCORTISONE SOD SUC (PF) 100 MG IJ SOLR
50.0000 mg | Freq: Four times a day (QID) | INTRAMUSCULAR | Status: DC
  Administered 2024-02-21 – 2024-02-23 (×9): 50 mg via INTRAVENOUS
  Filled 2024-02-21 (×9): qty 2

## 2024-02-21 MED ORDER — NOREPINEPHRINE 16 MG/250ML-% IV SOLN
0.0000 ug/min | INTRAVENOUS | Status: DC
  Administered 2024-02-21: 5 ug/min via INTRAVENOUS
  Filled 2024-02-21 (×2): qty 250

## 2024-02-21 MED ORDER — LACTATED RINGERS IV BOLUS
500.0000 mL | Freq: Once | INTRAVENOUS | Status: AC
  Administered 2024-02-21: 500 mL via INTRAVENOUS

## 2024-02-21 MED ORDER — DEXTROSE 10 % IV SOLN: INTRAVENOUS | Status: DC

## 2024-02-21 MED ORDER — DEXTROSE 50 % IV SOLN
12.5000 g | INTRAVENOUS | Status: AC
  Administered 2024-02-21: 12.5 g via INTRAVENOUS

## 2024-02-21 MED ORDER — SODIUM CHLORIDE 0.9% FLUSH
10.0000 mL | Freq: Two times a day (BID) | INTRAVENOUS | Status: DC
  Administered 2024-02-21: 30 mL
  Administered 2024-02-21 – 2024-02-22 (×2): 10 mL
  Administered 2024-02-22: 20 mL
  Administered 2024-02-23 (×2): 10 mL

## 2024-02-21 MED ORDER — DEXTROSE 50 % IV SOLN
INTRAVENOUS | Status: AC
  Filled 2024-02-21: qty 50

## 2024-02-21 MED ORDER — SODIUM CHLORIDE 0.9% FLUSH: 10.0000 mL | INTRAVENOUS | Status: DC | PRN

## 2024-02-21 MED ORDER — VASOPRESSIN 20 UNITS/100 ML INFUSION FOR SHOCK
0.0000 [IU]/min | INTRAVENOUS | Status: DC
  Administered 2024-02-21 (×3): 0.03 [IU]/min via INTRAVENOUS
  Filled 2024-02-21 (×4): qty 100

## 2024-02-21 NOTE — Consult Note (Signed)
 "                                   Consultation Note Date: 02/21/2024   Patient Name: Ashley Ryan  DOB: 1945/08/06  MRN: [id number]  Age / Sex: 79 y.o., female  PCP: Avelina Greig BRAVO, MD Referring Physician: Malka Domino, MD  Reason for Consultation: Establishing goals of care   HPI/Brief Hospital Course: 79 y.o. 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  Found to have small bowel obstruction--general surgery consulted, conservative management initially with NGT placement and IVF resuscitation, AKI and hypotension (developing sepsis?) 1/29 found to have worsening lactic acidosis, worsening septic shock and hypotension--vasopressors initiated, emergent OR 1/29 s/p exploratory laparotomy, lysis of adhesions, reduction of internal hernia, small bowel resection, wound vac placement 1/30 back to OR for second look laparotomy, small bowel resection, anastomosis and abdominal closure, wound vac placement Remains intubated post procedure and continues to require vasopressor support  Palliative medicine was consulted for assisting with goals of care conversations.  Subjective:  Chart reviewed: Labs:Reviewed and trended since admission Vital signs:Reviewed from today Progress Notes:Primary team, general surgery, CCM, and nursing notes reviewed since admission Imaging:Reviewed since admission  Visited with Ms. Chervenak at her bedside. She remains intubated and sedated. Able to open eyes briefly to calling of her name, unable to follow commands. Per nursing, daughter is primary point of contact, she has requested her brother (patients son) not be shared results or detailed updates and she prefers to be the one who shares information with him. Son at bedside during time of visit.  Introduced myself as a publishing rights manager as a  member of the palliative care team. Explained palliative medicine is specialized medical care for people living with serious illness. It focuses on providing relief from the symptoms and stress of a serious illness. The goal is to improve quality of life for both the patient and the family.   Son shares a brief life review, Ms. Stailey and her current husband have been married for over 20 years--no children together. Son and daughters biological father is deceased. Ms. Keil current husband-Otis [name] is in bad health, suffers from leukemia and requires oxygen concentration at all times--unable to visit. 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 He shares Ms. 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 Son shares he lives right behind his mother and his sister lives about 30 minutes away. Son shares his sister is primary point of contact regarding medical treatment.  Called and spoke with daughter-Danette. She confirms above life review provided by her brother. Danette shares [name] is in bad health, she has been keeping him updated regarding Ms. Alperin's condition but also tried to not cause more anxiety. Danette shares her mother in the past has vocalized appointment Danette as surrogate decision maker but they have never completed paper work.  Danette abel to share her understanding of Ms. Sias's reason for hospitalization and events that have occurred. Aware of extensive abdominal surgeries, requiring vasopressor support, shares WUA failed today and Ms. Pascale was sent down for CT scan. CT scan results remain pending.  We discussed patient's current illness and what it means in the larger context of patient's  on-going co-morbidities. Natural disease trajectory and expectations at EOL were discussed.   Danette shares she and her mom have never specifically discussed goals of care. Danette shares her mother would not be accepting of  long term life preserving measures. Danette shares at this time she feels her mother would be accepting of all interventions to improve and live. Danette speaks to her active lifestyle and that being important to Ms. Kent. No changes to goals of care at this time.  I discussed importance of continued conversations with family/support persons and all members of their medical team regarding overall plan of care and treatment options ensuring decisions are in alignment with patients goals of care.  All questions/concerns addressed. Emotional support provided to patient/family/support persons. PMT will continue to follow and support patient as needed.  Objective: Primary Diagnoses: Present on Admission:  Hypertension associated with diabetes (HCC)  MDD (major depressive disorder), mild (HCC)  GERD (gastroesophageal reflux disease)  Controlled type 2 diabetes with neuropathy (HCC)  CAD (coronary artery disease), nonobstructive  (HFpEF) heart failure with preserved ejection fraction (HCC)  OAB (overactive bladder)  Diabetic peripheral neuropathy (HCC)   Physical Exam Constitutional:      Interventions: She is sedated and intubated.  Cardiovascular:     Rate and Rhythm: Normal rate and regular rhythm.  Pulmonary:     Effort: Pulmonary effort is normal. No respiratory distress. She is intubated.  Abdominal:     Palpations: Abdomen is soft.     Comments: Midline abdominal wound, wound vac in place  Skin:    General: Skin is warm and dry.     Findings: Bruising present.     Vital Signs: BP (!) 123/51   Pulse (!) 106   Temp 99.1 F (37.3 C) (Axillary)   Resp 16   Ht 4' 11 (1.499 m)   Wt 65.1 kg   SpO2 94%   BMI 28.99 kg/m  Pain Scale: CPOT   Pain Score: 7   IO: Intake/output summary:  Intake/Output Summary (Last 24 hours) at 02/21/2024 1456 Last data filed at 02/21/2024 1000 Gross per 24 hour  Intake 4883.08 ml  Output 695 ml  Net 4188.08 ml    LBM: Last BM Date :  02/20/24 Baseline Weight: Weight: 52.6 kg (from OV 01/28/24) Most recent weight: Weight: 65.1 kg      Assessment and Plan  SUMMARY OF RECOMMENDATIONS   Continue current plan of care Time for outcomes  Palliative Prophylaxis:   Bowel Regimen, Delirium Protocol and Frequent Pain Assessment   Discussed With: Nursing staff   Thank you for this consult and allowing Palliative Medicine to participate in the care of Gaetana DOROTHA Larve. Palliative medicine will continue to follow and assist as needed.   I personally spent a total of 75 minutes in the care of the patient today including preparing to see the patient, getting/reviewing separately obtained history, performing a medically appropriate exam/evaluation, counseling and educating, referring and communicating with other health care professionals, documenting clinical information in the EHR, and coordinating care.    Signed by: Waddell Lesches, DNP, AGNP-C Palliative Medicine    Please contact Palliative Medicine Team phone at [phone] for questions and concerns.  For individual provider: See Amion   "

## 2024-02-21 NOTE — Progress Notes (Signed)
 "  NAME:  Ashley Ryan, MRN:  [id number], DOB:  [birth year], LOS: 3 ADMISSION DATE:  02/18/2024, CONSULTATION DATE:  02/18/2024 REFERRING MD:  Dr. Fernand, CHIEF COMPLAINT:  Hypotension    Brief Pt Description / Synopsis:  79 y.o. female admitted with Small Bowel Obstruction and AKI.  Course complicated by development of Septic Shock due to Peritonitis and necrotic small bowel, taken for emergent Laparotomy with lysis of adhesions, reduction of internal hernia, and small bowel resection.  Remains intubated post procedure.    History of Present Illness:  Ashley Ryan is a 79 y.o. 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  Per patient this started 2 days ago but since yesterday she has been progressively getting worse.  States has not had a bowel movement since 2 days. She has had multiple episodes of nausea and vomiting. Given worsening symptoms, they called EMS to get further care   ED Course: Initial Vital Signs: Temperature 97.8 F, RR 26, pulse 128, BP 73/64, SpO2 100% on room air Significant Labs: leukocytosis to 18.7, normal hemoglobin with elevated hematocrit consistent with hemoconcentration. CMP with moderate hyperglycemia, AKI, ALT and AST elevation. Lipase normal. Troponin mildly elevated but stable.  Imaging Chest X-ray>>IMPRESSION: 1. Significant interval enlargement of the hiatal hernia with diffuse gaseous distension of both the intrathoracic and intra-abdominal portions of the stomach. This suggests the possibility of gastric outlet obstruction. Recommend further evaluation with contrast-enhanced CT scan of the abdomen and pelvis. 2. Slightly increased atelectasis in the right lung surrounding the enlarging hiatal hernia. Otherwise, no acute cardiopulmonary process. CT Abdomen &  Pelvis>>IMPRESSION: 1. High grade small bowel obstruction with obstruction favored to be in the right lower quadrant slash right pelvis . 2. Small amount of free fluid and hazy mesentery within the distal small bowel and along the left pericardial gutter related to small bowel obstruction and venous congestion . 3. No pneumatosis or portal venous gas.no intraperitoneal free air. . 4. Very large hiatal hernia with half the stomach above the hemidiaphragm and markedly distended . Medications Administered: 2.5 L LR boluses, Zosyn , Zofran  and Morphine    She met SIRS criteria, therefore sepsis workup initiated and Antibiotics given. [facility] asked to admit for further workup and treatment.  General Surgery was consulted   Please see Significant Hospital Events section below for full detailed hospital course.   Pertinent  Medical History   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  Mastectomy/ SLN.   Chest pain    a. 10/2017 Cath: Nl cors.   Chronic headache    Chronic heart failure with preserved ejection fraction (HFpEF) (HCC)    a. 10/2021 Echo: EF 60-65%, no rwma, mild LVH, GrI DD, nl RV size/fxn, mild MR.   Complication of anesthesia    prior to 1991 used to have PONV.  none recently.   Diverticulosis of colon    Family history of adverse reaction to anesthesia    mother/daighter get sick   GERD with stricture    Hard of hearing    Hiatal hernia    Hypokalemia    Labile hypertension    a. 10/2017 Cath: nl renal arteries.   Mitral regurgitation  a. 10/2021 Echo: Mild MR.   Mixed hyperlipidemia    Neuropathy    feet   Osteoporosis    Pre-diabetes    Pulmonary hypertension (HCC)    a. 10/2017 Echo: PASP 35-38mmHg.   Wears dentures    full upper    Micro Data:  1/28: Blood cultures x2>> no growth to date 1/29: MRSA PCR +  Antimicrobials:   Anti-infectives (From  admission, onward)    Start     Dose/Rate Route Frequency Ordered Stop   02/19/24 1200  linezolid  (ZYVOX ) IVPB 600 mg  Status:  Discontinued        600 mg 300 mL/hr over 60 Minutes Intravenous Every 12 hours 02/19/24 1057 02/20/24 1053   02/18/24 2200  piperacillin -tazobactam (ZOSYN ) IVPB 3.375 g        3.375 g 12.5 mL/hr over 240 Minutes Intravenous Every 8 hours 02/18/24 1826     02/18/24 1445  piperacillin -tazobactam (ZOSYN ) IVPB 3.375 g        3.375 g 100 mL/hr over 30 Minutes Intravenous  Once 02/18/24 1439 02/18/24 1524       Significant Hospital Events: Including procedures, antibiotic start and stop dates in addition to other pertinent events   1/28: Admitted by [facility] for SBO and AKI.  General Surgery consulted.  BP soft, PCCM consulted for potential vasopressors.  1/29: This morning with worsening lactic acidosis and hypotension requiring initiation of Levophed , CT with increase in edema and fluid along mesentery concerning for Peritonitis and necrotic small bowel.  Taken for emergent laparotomy with lysis of adhesions, reduction of internal hernia, small bowel resection, and placement of wound VAC  Remains intubated post procedure, tentative plan to return to OR tomorrow.  1/30: No significant events overnight.   Interim History / Subjective:  Patient remains intubated and off sedation. Opens eyes to voice but does not follow any commands.  NE uptrending this morning. Given fluids and added Vaso.   Objective   Blood pressure (!) 123/51, pulse (!) 106, temperature 99.1 F (37.3 C), temperature source Axillary, resp. rate 16, height 4' 11 (1.499 m), weight 65.1 kg, SpO2 94%. CVP:  [2 mmHg-12 mmHg] 10 mmHg  Vent Mode: PRVC FiO2 (%):  [35 %-40 %] 35 % Set Rate:  [16 bmp] 16 bmp Vt Set:  [400 mL] 400 mL PEEP:  [5 cmH20] 5 cmH20 Plateau Pressure:  [16 cmH20] 16 cmH20   Intake/Output Summary (Last 24 hours) at 02/21/2024 1121 Last data filed at 02/21/2024 1000 Gross per 24 hour   Intake 5476.64 ml  Output 1295 ml  Net 4181.64 ml   Filed Weights   02/19/24 1036 02/20/24 0500 02/21/24 0500  Weight: 58.1 kg 64.1 kg 65.1 kg    Examination: General: Critically ill appearing elderly female, laying in bed, intubated and sedated, in NAD HENT: Atraumatic, normocephalic, neck supple, no JVD, orally intubated Lungs: Mechanical breath sounds throughout, even, nonlabored, synchronous with vent Cardiovascular: Tachycardia, regular rhythm, s1s2, no M/R/G Abdomen: Wound vac in place to midline, Extremities: 1+ edema BLE, no cyanosis, good peripheral perfusion Neuro: Sedated, currently does not follow commands, PERRL GU: foley catheter in place   Assessment & Plan:  #Septic Shock  #Peritonitis  #Small bowel necrosis #AKI  #Metabolic Acidosis #Shock Liver #Toxic Metabolic Encephalopathy #Post operative ventilatory requirement #Hypoglycemia     Neurology - remains intubated post op given critical illness, metabolic disturbances, and shock. Initiated on propofol  for sedation, added dilaudid  PRN for pain.  - CT head wo contrast.  Cardiovascular - septic shock secondary to peritonitis and ischemic bowel, now s/p resection. Lactic acid with significant elevation and she is on nor-epinephrine  and phenylephrine  to maintain MAP > 65 mmHg.             -titrate norepi and vaso for MAP > 65 mmHg                        -TTE ordered - LVEF 70%.    Pulmonary - remains intubated post operatively, currently on minimal ventilator settings with normal pH. Will attempt to wake up and wean off the ventilator as her underlying metabolic process (acidosis, shock, lactic acid build up) improves.   Gastrointestinal - NPO, s/p ex-lap for resection of dead bowel. On PPI for SUP. Start TPN today. Given overall poor prognisis with refreactory shock, palliative care consult placed.    Renal - AKI secondary to shock, received fluids and on IV vasopressors. Continue to monitor kidney function  and avoid nephrotoxins as able.   Endocrine - ICU glycemic protocol. Monitoring for hypoglycemia, receiving D10W   Hem/Onc - heparin  for DVT prophylaxis   ID - septic shock from peritonitis, now on zosyn  and linezolid . Blood cultures sent and pending.   Critical Care Time: 40 minutes  Darrin Barn, MD [facility] 02/21/2024 11:21 AM   "

## 2024-02-21 NOTE — Progress Notes (Signed)
 Peripherally Inserted Central Catheter Placement  The IV Nurse has discussed with the patient and/or persons authorized to consent for the patient, the purpose of this procedure and the potential benefits and risks involved with this procedure.  The benefits include less needle sticks, lab draws from the catheter, and the patient may be discharged home with the catheter. Risks include, but not limited to, infection, bleeding, blood clot (thrombus formation), and puncture of an artery; nerve damage and irregular heartbeat and possibility to perform a PICC exchange if needed/ordered by physician.  Alternatives to this procedure were also discussed.  Bard Power PICC patient education guide, fact sheet on infection prevention and patient information card has been provided to patient /or left at bedside.  Telephone consent obtained from dtr.  PICC Placement Documentation  PICC Triple Lumen 02/21/24 Left Brachial 40 cm 0 cm (Active)  Indication for Insertion or Continuance of Line Vasoactive infusions;Administration of hyperosmolar/irritating solutions (i.e. TPN, Vancomycin , etc.) 02/21/24 1109  Exposed Catheter (cm) 0 cm 02/21/24 1109  Site Assessment Clean, Dry, Intact 02/21/24 1109  Lumen #1 Status Flushed;Saline locked;Blood return noted 02/21/24 1109  Lumen #2 Status Flushed;Saline locked;Blood return noted 02/21/24 1109  Lumen #3 Status Flushed;Saline locked;Blood return noted 02/21/24 1109  Dressing Type Transparent;Securing device 02/21/24 1109  Dressing Status Antimicrobial disc/dressing in place;Clean, Dry, Intact 02/21/24 1109  Line Care Connections checked and tightened 02/21/24 1109  Line Adjustment (NICU/IV Team Only) No 02/21/24 1109  Dressing Intervention New dressing;Adhesive placed at insertion site (IV team only);Adhesive placed around edges of dressing (IV team/ICU RN only) 02/21/24 1109  Dressing Change Due 02/28/24 02/21/24 1109       Ashley Ryan 02/21/2024, 11:10  AM

## 2024-02-21 NOTE — Plan of Care (Signed)
 Patient remains on Levophed , Vasopressin , Fentanyl , D10 infusions at this time. Patient not requiring sedation. Patient opens eyes to voice, does not follow commands at this time.   Problem: Metabolic: Goal: Ability to maintain appropriate glucose levels will improve Outcome: Progressing   Problem: Skin Integrity: Goal: Risk for impaired skin integrity will decrease Outcome: Progressing

## 2024-02-21 NOTE — Anesthesia Postprocedure Evaluation (Signed)
"   Anesthesia Post Note  Patient: Ashley Ryan  Procedure(s) Performed: LAPAROTOMY, EXPLORATORY INDOCYANINE GREEN  FLUORESCENCE IMAGING (ICG)  Patient location during evaluation: ICU Anesthesia Type: General Level of consciousness: sedated and patient remains intubated per anesthesia plan Pain management: pain level controlled Vital Signs Assessment: post-procedure vital signs reviewed and stable Respiratory status: respiratory function stable and patient remains intubated per anesthesia plan Cardiovascular status: blood pressure returned to baseline and stable (on multiple pressors to maintain BP) Postop Assessment: no apparent nausea or vomiting Anesthetic complications: no   There were no known notable events for this encounter.   Last Vitals:  Vitals:   02/21/24 0800 02/21/24 0900  BP: 109/61 130/61  Pulse: (!) 112 (!) 104  Resp: 19 15  Temp:    SpO2: 95% 94%    Last Pain:  Vitals:   02/21/24 0741  TempSrc: Axillary  PainSc:                  Windell Farr      "

## 2024-02-21 NOTE — Progress Notes (Signed)
 PHARMACY - TOTAL PARENTERAL NUTRITION CONSULT NOTE   Indication: massive bowel resection   Patient Measurements: Height: 4' 11 (149.9 cm) Weight: 65.1 kg (143 lb 8.3 oz) IBW/kg (Calculated) : 43.2 TPN AdjBW (KG): 46.9 Body mass index is 28.99 kg/m.  Assessment:  79 y/o female with h/o IDA, B12 deficiency, Cameron erosions, DM, large hiatal hernia, MDD, HTN, CHF, CAD, HLD, diverticulosis, PUD, breast cancer s/p mastectomy, GERD, pulmonary hypertension and chronic constipation who is admitted with AKI and septic shock secondary to peritonitis, SBO with bowel necrosis and internal hernia now s/p exploratory laparotomy, lysis of adhesions, reduction of internal hernia, small bowel resection (~18-20cm mid bowel) and placement of ABThera negative pressure wound VAC 1/29.   Glucose / Insulin : BG < 100 --IV steroids started 01/31  Electrolytes: WNL Renal: SCr 1.6 > 1.15 Hepatic: LFTs improving --TG wnl Intake / Output; MIVF: dextrose  10 % at 100 mL/hr GI Imaging: No new pertinent imaging studies  GI Surgeries / Procedures:   s/p exploratory laparotomy, lysis of adhesions, reduction of internal hernia, small bowel resection, placement of ABThera negative pressure wound VAC 01/29  Central access: 02/21/24 TPN start date: 02/21/24  Nutritional Goals: Goal TPN rate is 60 mL/hr (provides 80.6 g of protein and 1601 kcals per day)  RD Assessment: Estimated Needs Total Energy Estimated Needs: 1400-1600kcal/day Total Protein Estimated Needs: 70-80g/day Total Fluid Estimated Needs: 1.3-1.5L/day  Current Nutrition:  NPO  Plan:  --Start TPN at 80mL/hr at 1800 --Electrolytes in TPN (standard): Na 50mEq/L, K 58mEq/L, Ca 74mEq/L, Mg 56mEq/L, and Phos 15mmol/L. Cl:Ac 1:1 --Add standard MVI and trace elements to TPN --hold SSI for now ISO hypoglycemia and adjust as needed  --Monitor TPN labs on Mon/Thurs, daily until stable  Ashley Ryan 02/21/2024,7:30 AM

## 2024-02-21 NOTE — Plan of Care (Signed)
" °  Problem: Education: Goal: Ability to describe self-care measures that may prevent or decrease complications (Diabetes Survival Skills Education) will improve Outcome: Progressing Goal: Individualized Educational Video(s) Outcome: Progressing   Problem: Coping: Goal: Ability to adjust to condition or change in health will improve Outcome: Progressing   Problem: Fluid Volume: Goal: Ability to maintain a balanced intake and output will improve Outcome: Progressing   Problem: Health Behavior/Discharge Planning: Goal: Ability to identify and utilize available resources and services will improve Outcome: Progressing Goal: Ability to manage health-related needs will improve Outcome: Progressing   Problem: Metabolic: Goal: Ability to maintain appropriate glucose levels will improve Outcome: Progressing   Problem: Nutritional: Goal: Maintenance of adequate nutrition will improve Outcome: Progressing Goal: Progress toward achieving an optimal weight will improve Outcome: Progressing   Problem: Skin Integrity: Goal: Risk for impaired skin integrity will decrease Outcome: Progressing   Problem: Tissue Perfusion: Goal: Adequacy of tissue perfusion will improve Outcome: Progressing   Problem: Education: Goal: Knowledge of General Education information will improve Description: Including pain rating scale, medication(s)/side effects and non-pharmacologic comfort measures Outcome: Progressing   Problem: Health Behavior/Discharge Planning: Goal: Ability to manage health-related needs will improve Outcome: Progressing   Problem: Clinical Measurements: Goal: Ability to maintain clinical measurements within normal limits will improve Outcome: Progressing Goal: Will remain free from infection Outcome: Progressing Goal: Diagnostic test results will improve Outcome: Progressing Goal: Respiratory complications will improve Outcome: Progressing Goal: Cardiovascular complication will  be avoided Outcome: Progressing   Problem: Activity: Goal: Risk for activity intolerance will decrease Outcome: Progressing   Problem: Nutrition: Goal: Adequate nutrition will be maintained Outcome: Progressing   Problem: Coping: Goal: Level of anxiety will decrease Outcome: Progressing   Problem: Elimination: Goal: Will not experience complications related to bowel motility Outcome: Progressing Goal: Will not experience complications related to urinary retention Outcome: Progressing   Problem: Pain Managment: Goal: General experience of comfort will improve and/or be controlled Outcome: Progressing   Problem: Safety: Goal: Ability to remain free from injury will improve Outcome: Progressing   Problem: Skin Integrity: Goal: Risk for impaired skin integrity will decrease Outcome: Progressing   Problem: Activity: Goal: Ability to tolerate increased activity will improve Outcome: Progressing   Problem: Respiratory: Goal: Ability to maintain a clear airway and adequate ventilation will improve Outcome: Progressing   Problem: Role Relationship: Goal: Method of communication will improve Outcome: Progressing   Problem: Education: Goal: Knowledge of the prescribed therapeutic regimen will improve Outcome: Progressing   Problem: Bowel/Gastric: Goal: Gastrointestinal status for postoperative course will improve Outcome: Progressing   Problem: Cardiac: Goal: Ability to maintain an adequate cardiac output Outcome: Progressing Goal: Will show no evidence of cardiac arrhythmias Outcome: Progressing   Problem: Nutritional: Goal: Will attain and maintain optimal nutritional status Outcome: Progressing   Problem: Neurological: Goal: Will regain or maintain usual level of consciousness Outcome: Progressing   Problem: Clinical Measurements: Goal: Ability to maintain clinical measurements within normal limits Outcome: Progressing Goal: Postoperative complications  will be avoided or minimized Outcome: Progressing   Problem: Respiratory: Goal: Will regain and/or maintain adequate ventilation Outcome: Progressing Goal: Respiratory status will improve Outcome: Progressing   Problem: Skin Integrity: Goal: Demonstrates signs of wound healing without infection Outcome: Progressing   Problem: Urinary Elimination: Goal: Will remain free from infection Outcome: Progressing Goal: Ability to achieve and maintain adequate urine output Outcome: Progressing   "

## 2024-02-21 NOTE — Progress Notes (Signed)
 CC: s/p bowel resection  Subjective: Still on vaso and levo , intubated, some response but not as brisk  Objective: Vital signs in last 24 hours: Temp:  [98 F (36.7 C)-99.6 F (37.6 C)] 98 F (36.7 C) (01/31 1930) Pulse Rate:  [94-122] 101 (01/31 1930) Resp:  [15-21] 19 (01/31 1930) BP: (92-137)/(47-65) 121/65 (01/31 1930) SpO2:  [93 %-98 %] 96 % (01/31 1930) Arterial Line BP: (78-151)/(48-84) 87/79 (01/31 1400) FiO2 (%):  [35 %-40 %] 35 % (01/31 1930) Weight:  [65.1 kg] 65.1 kg (01/31 0500) Last BM Date : 02/20/24  Intake/Output from previous day: 01/30 0701 - 01/31 0700 In: 5102.6 [I.V.:4189.5; Blood:635.9; NG/GT:90; IV Piggyback:187.2] Out: 1145 [Urine:1115; Blood:10] Intake/Output this shift: Total I/O In: 297.1 [I.V.:272.1; IV Piggyback:25] Out: -   Physical exam:  Intubated Abd: prevena in place , good seal, no obvious rebound   Lab Results: CBC  Recent Labs    02/20/24 1730 02/21/24 0430  WBC 5.5 6.4  HGB 11.2* 10.4*  HCT 35.5* 32.6*  PLT 101* 89*   BMET Recent Labs    02/20/24 1730 02/21/24 0430  NA 137 135  K 4.7 4.2  CL 101 101  CO2 19* 19*  GLUCOSE 74 68*  BUN 28* 28*  CREATININE 1.12* 1.15*  CALCIUM  7.4* 6.9*   PT/INR Recent Labs    02/20/24 0345 02/20/24 1255  LABPROT 24.0* 20.8*  INR 2.0* 1.7*   ABG Recent Labs    02/19/24 2044 02/20/24 0451  PHART 7.42 7.41  HCO3 27.2 25.4    Studies/Results: CT HEAD WO CONTRAST ( ) Result Date: 02/21/2024 EXAM: CT HEAD WITHOUT CONTRAST 02/21/2024 01:43:03 PM TECHNIQUE: CT of the head was performed without the administration of intravenous contrast. Automated exposure control, iterative reconstruction, and/or weight based adjustment of the mA/kV was utilized to reduce the radiation dose to as low as reasonably achievable. COMPARISON: None available. CLINICAL HISTORY: Mental status change, unknown cause. FINDINGS: BRAIN AND VENTRICLES: No acute hemorrhage. No evidence of acute infarct. No  hydrocephalus. No extra-axial collection. No mass effect or midline shift. Periventricular white matter changes, likely sequela of chronic small vessel ischemic disease. Atherosclerotic calcifications in intracranial carotid and vertebral arteries. ORBITS: No acute abnormality. Bilateral lens replacements. SINUSES: No acute abnormality. SOFT TISSUES AND SKULL: Endotracheal and enteric tubes in place. No acute soft tissue abnormality. No skull fracture. IMPRESSION: 1. No acute intracranial abnormality. Electronically signed by: Lonni Necessary MD 02/21/2024 05:59 PM EST RP Workstation: [id number]   US  EKG SITE RITE Result Date: 02/20/2024 If Site Rite image not attached, placement could not be confirmed due to current cardiac rhythm.  US  EKG SITE RITE Result Date: 02/20/2024 If Site Rite image not attached, placement could not be confirmed due to current cardiac rhythm.  ECHOCARDIOGRAM COMPLETE Result Date: 02/20/2024    ECHOCARDIOGRAM REPORT   Patient Name:   Ashley Ryan Date of Exam: 02/20/2024 Medical Rec #:  [id number]      Height:       59.0 in Accession #:    [id number]     Weight:       141.3 lb Date of Birth:  [birth year]       BSA:          1.591 m Patient Age:    78 years       BP:           118/72 mmHg Patient Gender: F              HR:  98 bpm. Exam Location:  [facility] Procedure: 2D Echo, Cardiac Doppler and Color Doppler (Both Spectral and Color            Flow Doppler were utilized during procedure). Indications:     Shock  History:         Patient has prior history of Echocardiogram examinations. CHF;                  Risk Factors:Hypertension.  Sonographer:     Rainelle Gull Referring Phys:  [id number] ROBET KIM Diagnosing Phys: Caron Poser IMPRESSIONS  1. Very technically difficult study.  2. Left ventricular ejection fraction, by estimation, is 70 to 75%. Left ventricular ejection fraction by 2D MOD biplane is 69.9 %. The left ventricle has hyperdynamic function. Left ventricular  endocardial border not optimally defined to evaluate regional wall motion. Indeterminate diastolic filling due to E-A fusion.  3. Right ventricular systolic function is normal. The right ventricular size is normal.  4. The mitral valve was not well visualized. No evidence of mitral valve regurgitation. No evidence of mitral stenosis.  5. The aortic valve has an indeterminant number of cusps. Aortic valve regurgitation is not visualized. No aortic stenosis is present. Comparison(s): A prior study was performed on 11/15/2021. No significant change from prior study. FINDINGS  Left Ventricle: Left ventricular ejection fraction, by estimation, is 70 to 75%. Left ventricular ejection fraction by 2D MOD biplane is 69.9 %. The left ventricle has hyperdynamic function. Left ventricular endocardial border not optimally defined to evaluate regional wall motion. The left ventricular internal cavity size was small. There is no left ventricular hypertrophy. Indeterminate diastolic filling due to E-A fusion. Right Ventricle: The right ventricular size is normal. Right vetricular wall thickness was not well visualized. Right ventricular systolic function is normal. Left Atrium: Left atrial size was not well visualized. Right Atrium: Right atrial size was normal in size. Pericardium: There is no evidence of pericardial effusion. Mitral Valve: The mitral valve was not well visualized. No evidence of mitral valve regurgitation. No evidence of mitral valve stenosis. Tricuspid Valve: The tricuspid valve is not well visualized. Tricuspid valve regurgitation is trivial. No evidence of tricuspid stenosis. Aortic Valve: The aortic valve has an indeterminant number of cusps. Aortic valve regurgitation is not visualized. No aortic stenosis is present. Aortic valve mean gradient measures 5.0 mmHg. Aortic valve peak gradient measures 9.5 mmHg. Aortic valve area, by VTI measures 1.43 cm. Pulmonic Valve: The pulmonic valve was not well  visualized. Pulmonic valve regurgitation is not visualized. No evidence of pulmonic stenosis. Aorta: The aortic root and ascending aorta are structurally normal, with no evidence of dilitation. Venous: IVC assessment for right atrial pressure unable to be performed due to mechanical ventilation. IAS/Shunts: The interatrial septum was not well visualized.  LEFT VENTRICLE PLAX 2D                        Biplane EF (MOD) LVIDd:         2.70 cm         LV Biplane EF:   Left LVIDs:         1.80 cm                          ventricular LV PW:         0.80 cm  ejection LV IVS:        0.80 cm                          fraction by LVOT diam:     1.50 cm                          2D MOD LV SV:         32                               biplane is LV SV Index:   20                               69.9 %. LVOT Area:     1.77 cm                                Diastology                                LV e' medial:    5.98 cm/s LV Volumes (MOD)               LV E/e' medial:  12.5 LV vol d, MOD    26.9 ml       LV e' lateral:   8.27 cm/s A2C:                           LV E/e' lateral: 9.0 LV vol d, MOD    36.1 ml A4C: LV vol s, MOD    8.7 ml A2C: LV vol s, MOD    8.9 ml A4C: LV SV MOD A2C:   18.2 ml LV SV MOD A4C:   36.1 ml LV SV MOD BP:    21.7 ml RIGHT VENTRICLE RV Basal diam:  3.70 cm RV Mid diam:    3.00 cm RV S prime:     16.50 cm/s TAPSE (M-mode): 1.6 cm LEFT ATRIUM             Index        RIGHT ATRIUM           Index LA Vol (A2C):   19.9 ml 12.51 ml/m  RA Area:     10.20 cm LA Vol (A4C):   30.8 ml 19.36 ml/m  RA Volume:   24.40 ml  15.33 ml/m LA Biplane Vol: 25.0 ml 15.71 ml/m  AORTIC VALVE                     PULMONIC VALVE AV Area (Vmax):    1.49 cm      PV Vmax:       1.40 m/s AV Area (Vmean):   1.47 cm      PV Vmean:      91.400 cm/s AV Area (VTI):     1.43 cm      PV VTI:        0.208 m AV Vmax:           154.00 cm/s   PV Peak grad:  7.8 mmHg AV Vmean:          103.000  cm/s  PV Mean grad:  4.0  mmHg AV VTI:            0.222 m AV Peak Grad:      9.5 mmHg AV Mean Grad:      5.0 mmHg LVOT Vmax:         130.00 cm/s LVOT Vmean:        85.500 cm/s LVOT VTI:          0.180 m LVOT/AV VTI ratio: 0.81  AORTA Ao Root diam: 2.50 cm Ao Asc diam:  2.60 cm MITRAL VALVE               TRICUSPID VALVE MV Area (PHT): 4.71 cm    TR Peak grad:   24.0 mmHg MV Decel Time: 161 msec    TR Vmax:        245.00 cm/s MV E velocity: 74.60 cm/s MV A velocity: 89.80 cm/s  SHUNTS MV E/A ratio:  0.83        Systemic VTI:  0.18 m                            Systemic Diam: 1.50 cm Caron Poser Electronically signed by Caron Poser Signature Date/Time: 02/20/2024/5:05:00 PM    Final    DG Chest Port 1 View Result Date: 02/20/2024 EXAM: 1 VIEW XRAY OF THE CHEST 02/20/2024 03:55:46 AM COMPARISON: 02/19/2024 CLINICAL HISTORY: Acute respiratory failure with hypoxia (HCC). ICD10: J96.01 Acute respiratory failure with hypoxia. FINDINGS: LINES, TUBES AND DEVICES: Endotracheal tube in place with tip 2 cm above the carina. Stable right IJ central line. The enteric tube courses through the large hiatal hernia with tip and sideboard below the ge junction LUNGS AND PLEURA: Unchanged hazy opacity in the right mid and lower lung, corresponding to known large hiatal hernia and overlying atelectasis. Right pleural effusion with layering. No pneumothorax. HEART AND MEDIASTINUM: No acute abnormality of the cardiac and mediastinal silhouettes. BONES AND SOFT TISSUES: No acute osseous abnormality. IMPRESSION: 1. Unchanged hazy opacity in the right mid and lower lung corresponding to large hiatal hernia with underlying small right pleural effusion and compressive-type atelectasis. Electronically signed by: Waddell Calk MD 02/20/2024 05:37 AM EST RP Workstation: [id number]    Anti-infectives: Anti-infectives (From admission, onward)    Start     Dose/Rate Route Frequency Ordered Stop   02/19/24 1200  linezolid  (ZYVOX ) IVPB 600 mg  Status:   Discontinued        600 mg 300 mL/hr over 60 Minutes Intravenous Every 12 hours 02/19/24 1057 02/20/24 1053   02/18/24 2200  piperacillin -tazobactam (ZOSYN ) IVPB 3.375 g        3.375 g 12.5 mL/hr over 240 Minutes Intravenous Every 8 hours 02/18/24 1826     02/18/24 1445  piperacillin -tazobactam (ZOSYN ) IVPB 3.375 g        3.375 g 100 mL/hr over 30 Minutes Intravenous  Once 02/18/24 1439 02/18/24 1524       Assessment/Plan: DOing ok considering degree on injury No evidence of complications Agree w ngt and tpn + A/bs We will continue to follow  Airica Schwartzkopf MD, FACS  02/21/2024

## 2024-02-22 ENCOUNTER — Encounter: Payer: Self-pay | Admitting: General Surgery

## 2024-02-22 DIAGNOSIS — N179 Acute kidney failure, unspecified: Secondary | ICD-10-CM | POA: Diagnosis not present

## 2024-02-22 DIAGNOSIS — R6521 Severe sepsis with septic shock: Secondary | ICD-10-CM | POA: Diagnosis not present

## 2024-02-22 DIAGNOSIS — K56609 Unspecified intestinal obstruction, unspecified as to partial versus complete obstruction: Secondary | ICD-10-CM | POA: Diagnosis not present

## 2024-02-22 DIAGNOSIS — A419 Sepsis, unspecified organism: Secondary | ICD-10-CM | POA: Diagnosis not present

## 2024-02-22 LAB — COMPREHENSIVE METABOLIC PANEL WITH GFR
ALT: 581 U/L — ABNORMAL HIGH (ref 0–44)
AST: 299 U/L — ABNORMAL HIGH (ref 15–41)
Albumin: 2.2 g/dL — ABNORMAL LOW (ref 3.5–5.0)
Alkaline Phosphatase: 154 U/L — ABNORMAL HIGH (ref 38–126)
Anion gap: 14 (ref 5–15)
BUN: 25 mg/dL — ABNORMAL HIGH (ref 8–23)
CO2: 21 mmol/L — ABNORMAL LOW (ref 22–32)
Calcium: 6.8 mg/dL — ABNORMAL LOW (ref 8.9–10.3)
Chloride: 96 mmol/L — ABNORMAL LOW (ref 98–111)
Creatinine, Ser: 0.91 mg/dL (ref 0.44–1.00)
GFR, Estimated: >60 mL/min
Glucose, Bld: 208 mg/dL — ABNORMAL HIGH (ref 70–99)
Potassium: 3.4 mmol/L — ABNORMAL LOW (ref 3.5–5.1)
Sodium: 131 mmol/L — ABNORMAL LOW (ref 135–145)
Total Bilirubin: 2.3 mg/dL — ABNORMAL HIGH (ref 0.0–1.2)
Total Protein: 4.1 g/dL — ABNORMAL LOW (ref 6.5–8.1)

## 2024-02-22 LAB — RENAL FUNCTION PANEL
Albumin: 2.1 g/dL — ABNORMAL LOW (ref 3.5–5.0)
Anion gap: 13 (ref 5–15)
BUN: 24 mg/dL — ABNORMAL HIGH (ref 8–23)
CO2: 21 mmol/L — ABNORMAL LOW (ref 22–32)
Calcium: 6.7 mg/dL — ABNORMAL LOW (ref 8.9–10.3)
Chloride: 95 mmol/L — ABNORMAL LOW (ref 98–111)
Creatinine, Ser: 0.92 mg/dL (ref 0.44–1.00)
GFR, Estimated: >60 mL/min
Glucose, Bld: 204 mg/dL — ABNORMAL HIGH (ref 70–99)
Phosphorus: 4 mg/dL (ref 2.5–4.6)
Potassium: 3.4 mmol/L — ABNORMAL LOW (ref 3.5–5.1)
Sodium: 129 mmol/L — ABNORMAL LOW (ref 135–145)

## 2024-02-22 LAB — CBC
HCT: 32.1 % — ABNORMAL LOW (ref 36.0–46.0)
Hemoglobin: 10 g/dL — ABNORMAL LOW (ref 12.0–15.0)
MCH: 26.2 pg (ref 26.0–34.0)
MCHC: 31.2 g/dL (ref 30.0–36.0)
MCV: 84 fL (ref 80.0–100.0)
Platelets: 75 10*3/uL — ABNORMAL LOW (ref 150–400)
RBC: 3.82 MIL/uL — ABNORMAL LOW (ref 3.87–5.11)
RDW: 18.9 % — ABNORMAL HIGH (ref 11.5–15.5)
WBC: 12.3 10*3/uL — ABNORMAL HIGH (ref 4.0–10.5)
nRBC: 0.8 % — ABNORMAL HIGH (ref 0.0–0.2)

## 2024-02-22 LAB — GLUCOSE, CAPILLARY
Glucose-Capillary: 135 mg/dL — ABNORMAL HIGH (ref 70–99)
Glucose-Capillary: 83 mg/dL (ref 70–99)
Glucose-Capillary: 89 mg/dL (ref 70–99)
Glucose-Capillary: 76 mg/dL (ref 70–99)
Glucose-Capillary: 90 mg/dL (ref 70–99)
Glucose-Capillary: 93 mg/dL (ref 70–99)
Glucose-Capillary: 122 mg/dL — ABNORMAL HIGH (ref 70–99)
Glucose-Capillary: <10 mg/dL — CL (ref 70–99)

## 2024-02-22 LAB — TYPE AND SCREEN
ABO/RH(D): A POS
Antibody Screen: NEGATIVE
Unit division: 0
Unit division: 0
Unit division: 0

## 2024-02-22 LAB — BPAM RBC
Blood Product Expiration Date: 202602222359
Blood Product Expiration Date: 202602262359
Blood Product Expiration Date: 202602272359
ISSUE DATE / TIME: 202601300919
Unit Type and Rh: 6200
Unit Type and Rh: 6200
Unit Type and Rh: 6200

## 2024-02-22 LAB — PREPARE RBC (CROSSMATCH)

## 2024-02-22 LAB — MAGNESIUM: Magnesium: 2.1 mg/dL (ref 1.7–2.4)

## 2024-02-22 LAB — LACTIC ACID, PLASMA: Lactic Acid, Venous: 4.5 mmol/L (ref 0.5–1.9)

## 2024-02-22 MED ORDER — INSULIN ASPART 100 UNIT/ML IJ SOLN
0.0000 [IU] | Freq: Four times a day (QID) | INTRAMUSCULAR | Status: DC
  Administered 2024-02-22 – 2024-02-26 (×6): 1 [IU] via SUBCUTANEOUS
  Filled 2024-02-22 (×6): qty 1

## 2024-02-22 MED ORDER — POTASSIUM CHLORIDE 10 MEQ/100ML IV SOLN
10.0000 meq | INTRAVENOUS | Status: AC
  Administered 2024-02-22 (×2): 10 meq via INTRAVENOUS
  Filled 2024-02-22 (×2): qty 100

## 2024-02-22 MED ORDER — TRACE MINERALS CU-MN-SE-ZN 300-55-60-3000 MCG/ML IV SOLN
INTRAVENOUS | Status: AC
  Filled 2024-02-22: qty 537.6

## 2024-02-22 MED ORDER — FUROSEMIDE 10 MG/ML IJ SOLN: 20.0000 mg | Freq: Once | INTRAMUSCULAR | Status: DC

## 2024-02-22 NOTE — Progress Notes (Signed)
 PHARMACY - TOTAL PARENTERAL NUTRITION CONSULT NOTE   Indication: massive bowel resection   Patient Measurements: Height: 4' 11 (149.9 cm) Weight: 66.1 kg (145 lb 11.6 oz) IBW/kg (Calculated) : 43.2 TPN AdjBW (KG): 46.9 Body mass index is 29.43 kg/m.  Assessment:  79 y/o female with h/o IDA, B12 deficiency, Cameron erosions, DM, large hiatal hernia, MDD, HTN, CHF, CAD, HLD, diverticulosis, PUD, breast cancer s/p mastectomy, GERD, pulmonary hypertension and chronic constipation who is admitted with AKI and septic shock secondary to peritonitis, SBO with bowel necrosis and internal hernia now s/p exploratory laparotomy, lysis of adhesions, reduction of internal hernia, small bowel resection (~18-20cm mid bowel) and placement of ABThera negative pressure wound VAC 1/29.   Glucose / Insulin : BG < 100 --IV steroids started 01/31  Electrolytes: WNL Renal: SCr 1.6 > 0.91 mg/dL Hepatic: LFTs improving --TG wnl Intake / Output; MIVF: dextrose  10 % at 100 mL/hr GI Imaging: No new pertinent imaging studies  GI Surgeries / Procedures:   s/p exploratory laparotomy, lysis of adhesions, reduction of internal hernia, small bowel resection, placement of ABThera negative pressure wound VAC 01/29  Central access: 02/21/24 TPN start date: 02/21/24  Nutritional Goals: Goal TPN rate is 60 mL/hr (provides 80.6 g of protein and 1601 kcals per day)  RD Assessment: Estimated Needs Total Energy Estimated Needs: 1400-1600kcal/day Total Protein Estimated Needs: 70-80g/day Total Fluid Estimated Needs: 1.3-1.5L/day  Current Nutrition:  NPO  Plan:  --advance TPN to 66mL/hr at 1800 --Electrolytes in TPN (standard): Na 50mEq/L, K 41mEq/L, Ca 61mEq/L, Mg 35mEq/L, and Phos 15mmol/L. Cl:Ac 1:1 --Add standard MVI and trace elements to TPN --start sSSI for now and adjust as needed  --Monitor TPN labs on Mon/Thurs, daily until stable  Ashley Ryan 02/22/2024,7:32 AM

## 2024-02-22 NOTE — Progress Notes (Signed)
 CC: s/p bowel resection  Subjective: Still on levo but vaso is off , intubated, more responsive and now SBT, following simpel commands  Objective: Vital signs in last 24 hours: Temp:  [97.8 F (36.6 C)-99.6 F (37.6 C)] 98.8 F (37.1 C) (02/01 0800) Pulse Rate:  [89-106] 106 (02/01 0800) Resp:  [15-23] 16 (02/01 0800) BP: (100-140)/(50-74) 117/57 (02/01 0800) SpO2:  [88 %-98 %] 94 % (02/01 0800) Arterial Line BP: (87-123)/(62-79) 87/79 (01/31 1400) FiO2 (%):  [28 %-35 %] 28 % (02/01 1015) Weight:  [66.1 kg] 66.1 kg (02/01 0435) Last BM Date : 02/21/24  Intake/Output from previous day: 01/31 0701 - 02/01 0700 In: 3661.8 [I.V.:2909.4; NG/GT:90; IV Piggyback:662.4] Out: 1658 [Urine:1358; Emesis/NG output:300] Intake/Output this shift: Total I/O In: 30 [NG/GT:30] Out: -   Physical exam:  Intubated Abd: prevena in place , good seal, no obvious rebound      Lab Results: CBC  Recent Labs    02/21/24 0430 02/22/24 0400  WBC 6.4 12.3*  HGB 10.4* 10.0*  HCT 32.6* 32.1*  PLT 89* 75*   BMET Recent Labs    02/21/24 0430 02/22/24 0400  NA 135 131*  129*  K 4.2 3.4*  3.4*  CL 101 96*  95*  CO2 19* 21*  21*  GLUCOSE 68* 208*  204*  BUN 28* 25*  24*  CREATININE 1.15* 0.91  0.92  CALCIUM  6.9* 6.8*  6.7*   PT/INR Recent Labs    02/20/24 0345 02/20/24 1255  LABPROT 24.0* 20.8*  INR 2.0* 1.7*   ABG Recent Labs    02/19/24 2044 02/20/24 0451  PHART 7.42 7.41  HCO3 27.2 25.4    Studies/Results: CT HEAD WO CONTRAST ( ) Result Date: 02/21/2024 EXAM: CT HEAD WITHOUT CONTRAST 02/21/2024 01:43:03 PM TECHNIQUE: CT of the head was performed without the administration of intravenous contrast. Automated exposure control, iterative reconstruction, and/or weight based adjustment of the mA/kV was utilized to reduce the radiation dose to as low as reasonably achievable. COMPARISON: None available. CLINICAL HISTORY: Mental status change, unknown cause. FINDINGS:  BRAIN AND VENTRICLES: No acute hemorrhage. No evidence of acute infarct. No hydrocephalus. No extra-axial collection. No mass effect or midline shift. Periventricular white matter changes, likely sequela of chronic small vessel ischemic disease. Atherosclerotic calcifications in intracranial carotid and vertebral arteries. ORBITS: No acute abnormality. Bilateral lens replacements. SINUSES: No acute abnormality. SOFT TISSUES AND SKULL: Endotracheal and enteric tubes in place. No acute soft tissue abnormality. No skull fracture. IMPRESSION: 1. No acute intracranial abnormality. Electronically signed by: Lonni Necessary MD 02/21/2024 05:59 PM EST RP Workstation: [id number]   US  EKG SITE RITE Result Date: 02/20/2024 If Site Rite image not attached, placement could not be confirmed due to current cardiac rhythm.  US  EKG SITE RITE Result Date: 02/20/2024 If Site Rite image not attached, placement could not be confirmed due to current cardiac rhythm.   Anti-infectives: Anti-infectives (From admission, onward)    Start     Dose/Rate Route Frequency Ordered Stop   02/19/24 1200  linezolid  (ZYVOX ) IVPB 600 mg  Status:  Discontinued        600 mg 300 mL/hr over 60 Minutes Intravenous Every 12 hours 02/19/24 1057 02/20/24 1053   02/18/24 2200  piperacillin -tazobactam (ZOSYN ) IVPB 3.375 g        3.375 g 12.5 mL/hr over 240 Minutes Intravenous Every 8 hours 02/18/24 1826     02/18/24 1445  piperacillin -tazobactam (ZOSYN ) IVPB 3.375 g        3.375 g  100 mL/hr over 30 Minutes Intravenous  Once 02/18/24 1439 02/18/24 1524       Assessment/Plan: Slowly improving Keep ng and tpn for now Continue supportive care Appreciate critical care team efforts No evidence of surgical complications   Laneta Luna, MD, FACS  02/22/2024

## 2024-02-22 NOTE — Progress Notes (Signed)
 "  NAME:  Ashley Ryan, MRN:  [id number], DOB:  02-Jun-1945, LOS: 4 ADMISSION DATE:  02/18/2024, CONSULTATION DATE:  02/18/2024 REFERRING MD:  Dr. Fernand, CHIEF COMPLAINT:  Hypotension    Brief Pt Description / Synopsis:  79 y.o. female admitted with Small Bowel Obstruction and AKI.  Course complicated by development of Septic Shock due to Peritonitis and necrotic small bowel, taken for emergent Laparotomy with lysis of adhesions, reduction of internal hernia, and small bowel resection.  Remains intubated post procedure.    History of Present Illness:  Ashley Ryan is a 79 y.o. 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  Per patient this started 2 days ago but since yesterday she has been progressively getting worse.  States has not had a bowel movement since 2 days. She has had multiple episodes of nausea and vomiting. Given worsening symptoms, they called EMS to get further care   ED Course: Initial Vital Signs: Temperature 97.8 F, RR 26, pulse 128, BP 73/64, SpO2 100% on room air Significant Labs: leukocytosis to 18.7, normal hemoglobin with elevated hematocrit consistent with hemoconcentration. CMP with moderate hyperglycemia, AKI, ALT and AST elevation. Lipase normal. Troponin mildly elevated but stable.  Imaging Chest X-ray>>IMPRESSION: 1. Significant interval enlargement of the hiatal hernia with diffuse gaseous distension of both the intrathoracic and intra-abdominal portions of the stomach. This suggests the possibility of gastric outlet obstruction. Recommend further evaluation with contrast-enhanced CT scan of the abdomen and pelvis. 2. Slightly increased atelectasis in the right lung surrounding the enlarging hiatal hernia. Otherwise, no acute cardiopulmonary process. CT Abdomen &  Pelvis>>IMPRESSION: 1. High grade small bowel obstruction with obstruction favored to be in the right lower quadrant slash right pelvis . 2. Small amount of free fluid and hazy mesentery within the distal small bowel and along the left pericardial gutter related to small bowel obstruction and venous congestion . 3. No pneumatosis or portal venous gas.no intraperitoneal free air. . 4. Very large hiatal hernia with half the stomach above the hemidiaphragm and markedly distended . Medications Administered: 2.5 L LR boluses, Zosyn , Zofran  and Morphine    She met SIRS criteria, therefore sepsis workup initiated and Antibiotics given. [facility] asked to admit for further workup and treatment.  General Surgery was consulted   Please see Significant Hospital Events section below for full detailed hospital course.   Pertinent  Medical History   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  Mastectomy/ SLN.   Chest pain    a. 10/2017 Cath: Nl cors.   Chronic headache    Chronic heart failure with preserved ejection fraction (HFpEF) (HCC)    a. 10/2021 Echo: EF 60-65%, no rwma, mild LVH, GrI DD, nl RV size/fxn, mild MR.   Complication of anesthesia    prior to 1991 used to have PONV.  none recently.   Diverticulosis of colon    Family history of adverse reaction to anesthesia    mother/daighter get sick   GERD with stricture    Hard of hearing    Hiatal hernia    Hypokalemia    Labile hypertension    a. 10/2017 Cath: nl renal arteries.   Mitral regurgitation  a. 10/2021 Echo: Mild MR.   Mixed hyperlipidemia    Neuropathy    feet   Osteoporosis    Pre-diabetes    Pulmonary hypertension (HCC)    a. 10/2017 Echo: PASP 35-59mmHg.   Wears dentures    full upper    Micro Data:  1/28: Blood cultures x2>> no growth to date 1/29: MRSA PCR +  Antimicrobials:   Anti-infectives (From  admission, onward)    Start     Dose/Rate Route Frequency Ordered Stop   02/19/24 1200  linezolid  (ZYVOX ) IVPB 600 mg  Status:  Discontinued        600 mg 300 mL/hr over 60 Minutes Intravenous Every 12 hours 02/19/24 1057 02/20/24 1053   02/18/24 2200  piperacillin -tazobactam (ZOSYN ) IVPB 3.375 g        3.375 g 12.5 mL/hr over 240 Minutes Intravenous Every 8 hours 02/18/24 1826     02/18/24 1445  piperacillin -tazobactam (ZOSYN ) IVPB 3.375 g        3.375 g 100 mL/hr over 30 Minutes Intravenous  Once 02/18/24 1439 02/18/24 1524       Significant Hospital Events: Including procedures, antibiotic start and stop dates in addition to other pertinent events   1/28: Admitted by [facility] for SBO and AKI.  General Surgery consulted.  BP soft, PCCM consulted for potential vasopressors.  1/29: This morning with worsening lactic acidosis and hypotension requiring initiation of Levophed , CT with increase in edema and fluid along mesentery concerning for Peritonitis and necrotic small bowel.  Taken for emergent laparotomy with lysis of adhesions, reduction of internal hernia, small bowel resection, and placement of wound VAC  Remains intubated post procedure, tentative plan to return to OR tomorrow.  1/30: No significant events overnight.   Interim History / Subjective:  -- Patient remains intubated. Did ok on SBT but became labored at the end. More awake today.  -- Anemic this am requiring 1 unit PRBC. But no clear signs of bleeding.  -- Pressors requirements downtrending.    Objective   Blood pressure (!) 117/57, pulse (!) 106, temperature 98.8 F (37.1 C), temperature source Axillary, resp. rate 16, height 4' 11 (1.499 m), weight 66.1 kg, SpO2 94%. CVP:  [6 mmHg-20 mmHg] 20 mmHg  Vent Mode: PRVC FiO2 (%):  [28 %-35 %] 28 % Set Rate:  [16 bmp] 16 bmp Vt Set:  [400 mL] 400 mL PEEP:  [5 cmH20] 5 cmH20 Pressure Support:  [5 cmH20-15 cmH20] 5 cmH20   Intake/Output Summary (Last 24 hours) at 02/22/2024  1507 Last data filed at 02/22/2024 1144 Gross per 24 hour  Intake 1752.89 ml  Output 1508 ml  Net 244.89 ml   Filed Weights   02/20/24 0500 02/21/24 0500 02/22/24 0435  Weight: 64.1 kg 65.1 kg 66.1 kg    Examination: General: Critically ill appearing elderly female, laying in bed, intubated and sedated, in NAD HENT: Atraumatic, normocephalic, neck supple, no JVD, orally intubated Lungs: Mechanical breath sounds throughout, even, nonlabored, synchronous with vent Cardiovascular: Tachycardia, regular rhythm, s1s2, no M/R/G Abdomen: Wound vac in place to midline, Extremities: 1+ edema BLE, no cyanosis, good peripheral perfusion Neuro: Sedated, currently does not follow commands, PERRL GU: foley catheter in place   Assessment & Plan:  #Septic Shock  #Peritonitis  #Small bowel necrosis #AKI  #Metabolic Acidosis[Resolbed] #Shock Liver [Improvong] #Toxic Metabolic Encephalopathy #Post operative ventilatory requirement #Hypoglycemia     Neurology - remains intubated post op given critical illness, metabolic disturbances, and shock. Initiated on propofol  for  sedation, added dilaudid  PRN for pain.  - CT head wo contrast.    Cardiovascular - septic shock secondary to peritonitis and ischemic bowel, now s/p resection. Lactic acid with significant elevation and she is on nor-epinephrine  and phenylephrine  to maintain MAP > 65 mmHg.             -titrate norepi and vaso for MAP > 65 mmHg                        -TTE ordered - LVEF 70%.    Pulmonary - remains intubated post operatively, currently on minimal ventilator settings with normal pH. Will attempt to wake up and wean off the ventilator as her underlying metabolic process (acidosis, shock, lactic acid build up) improves.  -- Daily SBT. Today is not the day.    Gastrointestinal - NPO, s/p ex-lap for resection of dead bowel. On PPI for SUP. Start TPN today. Given overall poor prognisis with refreactory shock, palliative care consult  placed.    Renal - AKI secondary to shock, received fluids and on IV vasopressors. Continue to monitor kidney function and avoid nephrotoxins as able.   -- Resolbed, good UOP.   -- Consider Diuresis in the am.    Endocrine - ICU glycemic protocol. POC 140-180.    Hem/Onc - heparin  for DVT prophylaxis   ID - septic shock from peritonitis, now on zosyn . Blood cultures negative.    Critical Care Time: 50 minutes  Darrin Barn, MD [facility] 02/22/2024 3:07 PM   "

## 2024-02-22 NOTE — Plan of Care (Signed)
  Problem: Metabolic: Goal: Ability to maintain appropriate glucose levels will improve Outcome: Progressing   Problem: Nutritional: Goal: Maintenance of adequate nutrition will improve Outcome: Progressing   Problem: Elimination: Goal: Will not experience complications related to bowel motility Outcome: Progressing

## 2024-02-22 NOTE — Progress Notes (Signed)
 "                                                                                                                                                                                                          Daily Progress Note   Patient Name: Ashley Ryan       Date: 02/22/2024 DOB: [birth year]  Age: 79 y.o. MRN#: [id number] Attending Physician: Malka Domino, MD Primary Care Physician: Avelina Greig BRAVO, MD Admit Date: 02/18/2024  Reason for Consultation/Follow-up: Establishing goals of care  HPI/Brief Hospital Review:  79 y.o. 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   Found to have small bowel obstruction--general surgery consulted, conservative management initially with NGT placement and IVF resuscitation, AKI and hypotension (developing sepsis?) 1/29 found to have worsening lactic acidosis, worsening septic shock and hypotension--vasopressors initiated, emergent OR 1/29 s/p exploratory laparotomy, lysis of adhesions, reduction of internal hernia, small bowel resection, wound vac placement 1/30 back to OR for second look laparotomy, small bowel resection, anastomosis and abdominal closure, wound vac placement Remains intubated post procedure and continues to require vasopressor support   Palliative medicine was consulted for assisting with goals of care conversations.   Following up today to continue goals of care conversations and coordination of care.  Subjective: Chart reviewed: Labs:Reviewed from today Vital signs:Reviewed from today, improvement in BP Progress Notes:CCM team, general surgery and nursing notes reviewed since last visit Imaging:CT head reviewed from 1/31 (-) for acute processes  Visited with [name] at her bedside. Nursing staff shares wean from vasopressin  and much lowered dose of leovphed  with goal to discontinue today. Sedation continues to be weaned, Ms. Mace slowly becoming more alert. On my assessment, [name] able to open her eyes to calling of her name, able to squeeze right hand on command.  Called and spoke with daughter--provided medical updates. Requested hearing aids be brought in to assist with communicating with [name]. Daughter remains hopeful for continued improvement, no changes to goals/plans of care at this time.  Answered and addressed all questions and concerns. PMT to continue to follow for ongoing needs and support.  Objective:  Physical Exam Constitutional:      Interventions: She is sedated and intubated.  Cardiovascular:     Rate and Rhythm: Regular rhythm. Tachycardia present.  Pulmonary:     Effort: Pulmonary effort is normal. No respiratory distress. She is  intubated.  Skin:    General: Skin is warm and dry.  Neurological:     Motor: Weakness present.             Vital Signs: BP (!) 117/57   Pulse (!) 106   Temp 98.8 F (37.1 C) (Axillary)   Resp 16   Ht 4' 11 (1.499 m)   Wt 66.1 kg   SpO2 94%   BMI 29.43 kg/m  SpO2: SpO2: 94 % O2 Device: O2 Device: Ventilator O2 Flow Rate: O2 Flow Rate (L/min): 4 L/min   Palliative Care Assessment & Plan   Assessment/Recommendation/Plan  Continue with current plan of care Time for outcomes  Care plan was discussed with nursing staff.  Thank you for allowing the Palliative Medicine Team to assist in the care of this patient.  I personally spent a total of 35 minutes in the care of the patient today including preparing to see the patient, getting/reviewing separately obtained history, performing a medically appropriate exam/evaluation, referring and communicating with other health care professionals, documenting clinical information in the EHR, and coordinating care.   Waddell Lesches, DNP, AGNP-C Palliative Medicine   Please contact Palliative Medicine Team phone at [phone]  for questions and concerns.   "

## 2024-02-22 NOTE — Plan of Care (Signed)
" °  Problem: Education: Goal: Ability to describe self-care measures that may prevent or decrease complications (Diabetes Survival Skills Education) will improve Outcome: Progressing Goal: Individualized Educational Video(s) Outcome: Progressing   Problem: Coping: Goal: Ability to adjust to condition or change in health will improve Outcome: Progressing   Problem: Fluid Volume: Goal: Ability to maintain a balanced intake and output will improve Outcome: Progressing   Problem: Health Behavior/Discharge Planning: Goal: Ability to identify and utilize available resources and services will improve Outcome: Progressing Goal: Ability to manage health-related needs will improve Outcome: Progressing   Problem: Metabolic: Goal: Ability to maintain appropriate glucose levels will improve Outcome: Progressing   Problem: Nutritional: Goal: Maintenance of adequate nutrition will improve Outcome: Progressing Goal: Progress toward achieving an optimal weight will improve Outcome: Progressing   Problem: Skin Integrity: Goal: Risk for impaired skin integrity will decrease Outcome: Progressing   Problem: Tissue Perfusion: Goal: Adequacy of tissue perfusion will improve Outcome: Progressing   Problem: Education: Goal: Knowledge of General Education information will improve Description: Including pain rating scale, medication(s)/side effects and non-pharmacologic comfort measures Outcome: Progressing   Problem: Health Behavior/Discharge Planning: Goal: Ability to manage health-related needs will improve Outcome: Progressing   Problem: Clinical Measurements: Goal: Ability to maintain clinical measurements within normal limits will improve Outcome: Progressing Goal: Will remain free from infection Outcome: Progressing Goal: Diagnostic test results will improve Outcome: Progressing Goal: Respiratory complications will improve Outcome: Progressing Goal: Cardiovascular complication will  be avoided Outcome: Progressing   Problem: Activity: Goal: Risk for activity intolerance will decrease Outcome: Progressing   Problem: Nutrition: Goal: Adequate nutrition will be maintained Outcome: Progressing   Problem: Coping: Goal: Level of anxiety will decrease Outcome: Progressing   Problem: Elimination: Goal: Will not experience complications related to bowel motility Outcome: Progressing Goal: Will not experience complications related to urinary retention Outcome: Progressing   Problem: Pain Managment: Goal: General experience of comfort will improve and/or be controlled Outcome: Progressing   Problem: Safety: Goal: Ability to remain free from injury will improve Outcome: Progressing   Problem: Skin Integrity: Goal: Risk for impaired skin integrity will decrease Outcome: Progressing   Problem: Activity: Goal: Ability to tolerate increased activity will improve Outcome: Progressing   Problem: Respiratory: Goal: Ability to maintain a clear airway and adequate ventilation will improve Outcome: Progressing   Problem: Role Relationship: Goal: Method of communication will improve Outcome: Progressing   Problem: Education: Goal: Knowledge of the prescribed therapeutic regimen will improve Outcome: Progressing   Problem: Bowel/Gastric: Goal: Gastrointestinal status for postoperative course will improve Outcome: Progressing   Problem: Cardiac: Goal: Ability to maintain an adequate cardiac output Outcome: Progressing Goal: Will show no evidence of cardiac arrhythmias Outcome: Progressing   Problem: Nutritional: Goal: Will attain and maintain optimal nutritional status Outcome: Progressing   Problem: Neurological: Goal: Will regain or maintain usual level of consciousness Outcome: Progressing   Problem: Clinical Measurements: Goal: Ability to maintain clinical measurements within normal limits Outcome: Progressing Goal: Postoperative complications  will be avoided or minimized Outcome: Progressing   Problem: Respiratory: Goal: Will regain and/or maintain adequate ventilation Outcome: Progressing Goal: Respiratory status will improve Outcome: Progressing   Problem: Skin Integrity: Goal: Demonstrates signs of wound healing without infection Outcome: Progressing   Problem: Urinary Elimination: Goal: Will remain free from infection Outcome: Progressing Goal: Ability to achieve and maintain adequate urine output Outcome: Progressing   "

## 2024-02-23 DIAGNOSIS — K56609 Unspecified intestinal obstruction, unspecified as to partial versus complete obstruction: Secondary | ICD-10-CM | POA: Diagnosis not present

## 2024-02-23 DIAGNOSIS — R6521 Severe sepsis with septic shock: Secondary | ICD-10-CM | POA: Diagnosis not present

## 2024-02-23 DIAGNOSIS — N179 Acute kidney failure, unspecified: Secondary | ICD-10-CM | POA: Diagnosis not present

## 2024-02-23 DIAGNOSIS — A419 Sepsis, unspecified organism: Secondary | ICD-10-CM | POA: Diagnosis not present

## 2024-02-23 LAB — CBC
HCT: 28.9 % — ABNORMAL LOW (ref 36.0–46.0)
Hemoglobin: 9.3 g/dL — ABNORMAL LOW (ref 12.0–15.0)
MCH: 26.6 pg (ref 26.0–34.0)
MCHC: 32.2 g/dL (ref 30.0–36.0)
MCV: 82.6 fL (ref 80.0–100.0)
Platelets: 56 10*3/uL — ABNORMAL LOW (ref 150–400)
RBC: 3.5 MIL/uL — ABNORMAL LOW (ref 3.87–5.11)
RDW: 19 % — ABNORMAL HIGH (ref 11.5–15.5)
WBC: 17.1 10*3/uL — ABNORMAL HIGH (ref 4.0–10.5)
nRBC: 0.5 % — ABNORMAL HIGH (ref 0.0–0.2)

## 2024-02-23 LAB — BLOOD GAS, ARTERIAL
Acid-Base Excess: 4.2 mmol/L — ABNORMAL HIGH (ref 0.0–2.0)
Acid-Base Excess: 3.7 mmol/L — ABNORMAL HIGH (ref 0.0–2.0)
Bicarbonate: 28.4 mmol/L — ABNORMAL HIGH (ref 20.0–28.0)
Bicarbonate: 28.5 mmol/L — ABNORMAL HIGH (ref 20.0–28.0)
FIO2: 28 %
MECHVT: 320 mL
O2 Saturation: 100 %
O2 Saturation: 85.5 %
PEEP: 5 cmH2O
PEEP: 55 cmH2O
Patient temperature: 37
Patient temperature: 37
Pressure support: 5 cmH2O
RATE: 18 {breaths}/min
pCO2 arterial: 40 mmHg (ref 32–48)
pCO2 arterial: 43 mmHg (ref 32–48)
pH, Arterial: 7.46 — ABNORMAL HIGH (ref 7.35–7.45)
pH, Arterial: 7.43 (ref 7.35–7.45)
pO2, Arterial: 161 mmHg — ABNORMAL HIGH (ref 83–108)
pO2, Arterial: 51 mmHg — ABNORMAL LOW (ref 83–108)

## 2024-02-23 LAB — GLUCOSE, CAPILLARY
Glucose-Capillary: 108 mg/dL — ABNORMAL HIGH (ref 70–99)
Glucose-Capillary: 110 mg/dL — ABNORMAL HIGH (ref 70–99)
Glucose-Capillary: 113 mg/dL — ABNORMAL HIGH (ref 70–99)
Glucose-Capillary: 116 mg/dL — ABNORMAL HIGH (ref 70–99)
Glucose-Capillary: 121 mg/dL — ABNORMAL HIGH (ref 70–99)
Glucose-Capillary: 121 mg/dL — ABNORMAL HIGH (ref 70–99)
Glucose-Capillary: 99 mg/dL (ref 70–99)

## 2024-02-23 LAB — COMPREHENSIVE METABOLIC PANEL WITH GFR
ALT: 373 U/L — ABNORMAL HIGH (ref 0–44)
AST: 178 U/L — ABNORMAL HIGH (ref 15–41)
Albumin: 2.1 g/dL — ABNORMAL LOW (ref 3.5–5.0)
Alkaline Phosphatase: 155 U/L — ABNORMAL HIGH (ref 38–126)
Anion gap: 12 (ref 5–15)
BUN: 31 mg/dL — ABNORMAL HIGH (ref 8–23)
CO2: 23 mmol/L (ref 22–32)
Calcium: 7.4 mg/dL — ABNORMAL LOW (ref 8.9–10.3)
Chloride: 99 mmol/L (ref 98–111)
Creatinine, Ser: 0.94 mg/dL (ref 0.44–1.00)
GFR, Estimated: >60 mL/min
Glucose, Bld: 123 mg/dL — ABNORMAL HIGH (ref 70–99)
Potassium: 3.6 mmol/L (ref 3.5–5.1)
Sodium: 135 mmol/L (ref 135–145)
Total Bilirubin: 2.3 mg/dL — ABNORMAL HIGH (ref 0.0–1.2)
Total Protein: 4.4 g/dL — ABNORMAL LOW (ref 6.5–8.1)

## 2024-02-23 LAB — CULTURE, BLOOD (ROUTINE X 2)
Culture: NO GROWTH
Culture: NO GROWTH

## 2024-02-23 LAB — BLOOD GAS, VENOUS
Acid-base deficit: 1.3 mmol/L (ref 0.0–2.0)
Bicarbonate: 24.8 mmol/L (ref 20.0–28.0)
Patient temperature: 37
pCO2, Ven: 46 mmHg (ref 44–60)
pH, Ven: 7.34 (ref 7.25–7.43)

## 2024-02-23 LAB — SURGICAL PATHOLOGY

## 2024-02-23 LAB — PROTIME-INR
INR: 1.1 (ref 0.8–1.2)
Prothrombin Time: 15.2 s (ref 11.4–15.2)

## 2024-02-23 LAB — MAGNESIUM: Magnesium: 2.5 mg/dL — ABNORMAL HIGH (ref 1.7–2.4)

## 2024-02-23 LAB — TRIGLYCERIDES: Triglycerides: 70 mg/dL

## 2024-02-23 LAB — AMMONIA: Ammonia: 32 umol/L (ref 9–35)

## 2024-02-23 LAB — PHOSPHORUS: Phosphorus: 3.2 mg/dL (ref 2.5–4.6)

## 2024-02-23 MED ORDER — TRACE MINERALS CU-MN-SE-ZN 300-55-60-3000 MCG/ML IV SOLN
INTRAVENOUS | Status: AC
  Filled 2024-02-23: qty 537.6

## 2024-02-23 MED ORDER — FUROSEMIDE 10 MG/ML IJ SOLN
40.0000 mg | Freq: Once | INTRAMUSCULAR | Status: AC
  Administered 2024-02-23: 40 mg via INTRAVENOUS
  Filled 2024-02-23: qty 4

## 2024-02-23 NOTE — Plan of Care (Signed)
" °  Problem: Education: Goal: Ability to describe self-care measures that may prevent or decrease complications (Diabetes Survival Skills Education) will improve Outcome: Progressing Goal: Individualized Educational Video(s) Outcome: Progressing   Problem: Coping: Goal: Ability to adjust to condition or change in health will improve Outcome: Progressing   Problem: Fluid Volume: Goal: Ability to maintain a balanced intake and output will improve Outcome: Progressing   Problem: Health Behavior/Discharge Planning: Goal: Ability to identify and utilize available resources and services will improve Outcome: Progressing Goal: Ability to manage health-related needs will improve Outcome: Progressing   Problem: Metabolic: Goal: Ability to maintain appropriate glucose levels will improve Outcome: Progressing   Problem: Nutritional: Goal: Maintenance of adequate nutrition will improve Outcome: Progressing Goal: Progress toward achieving an optimal weight will improve Outcome: Progressing   Problem: Skin Integrity: Goal: Risk for impaired skin integrity will decrease Outcome: Progressing   Problem: Tissue Perfusion: Goal: Adequacy of tissue perfusion will improve Outcome: Progressing   Problem: Education: Goal: Knowledge of General Education information will improve Description: Including pain rating scale, medication(s)/side effects and non-pharmacologic comfort measures Outcome: Progressing   Problem: Health Behavior/Discharge Planning: Goal: Ability to manage health-related needs will improve Outcome: Progressing   Problem: Clinical Measurements: Goal: Ability to maintain clinical measurements within normal limits will improve Outcome: Progressing Goal: Will remain free from infection Outcome: Progressing Goal: Diagnostic test results will improve Outcome: Progressing Goal: Respiratory complications will improve Outcome: Progressing Goal: Cardiovascular complication will  be avoided Outcome: Progressing   Problem: Activity: Goal: Risk for activity intolerance will decrease Outcome: Progressing   Problem: Nutrition: Goal: Adequate nutrition will be maintained Outcome: Progressing   Problem: Coping: Goal: Level of anxiety will decrease Outcome: Progressing   Problem: Elimination: Goal: Will not experience complications related to bowel motility Outcome: Progressing Goal: Will not experience complications related to urinary retention Outcome: Progressing   Problem: Pain Managment: Goal: General experience of comfort will improve and/or be controlled Outcome: Progressing   Problem: Safety: Goal: Ability to remain free from injury will improve Outcome: Progressing   Problem: Skin Integrity: Goal: Risk for impaired skin integrity will decrease Outcome: Progressing   Problem: Activity: Goal: Ability to tolerate increased activity will improve Outcome: Progressing   Problem: Respiratory: Goal: Ability to maintain a clear airway and adequate ventilation will improve Outcome: Progressing   Problem: Role Relationship: Goal: Method of communication will improve Outcome: Progressing   Problem: Education: Goal: Knowledge of the prescribed therapeutic regimen will improve Outcome: Progressing   Problem: Bowel/Gastric: Goal: Gastrointestinal status for postoperative course will improve Outcome: Progressing   Problem: Cardiac: Goal: Ability to maintain an adequate cardiac output Outcome: Progressing Goal: Will show no evidence of cardiac arrhythmias Outcome: Progressing   Problem: Nutritional: Goal: Will attain and maintain optimal nutritional status Outcome: Progressing   Problem: Neurological: Goal: Will regain or maintain usual level of consciousness Outcome: Progressing   Problem: Clinical Measurements: Goal: Ability to maintain clinical measurements within normal limits Outcome: Progressing Goal: Postoperative complications  will be avoided or minimized Outcome: Progressing   Problem: Respiratory: Goal: Will regain and/or maintain adequate ventilation Outcome: Progressing Goal: Respiratory status will improve Outcome: Progressing   Problem: Skin Integrity: Goal: Demonstrates signs of wound healing without infection Outcome: Progressing   Problem: Urinary Elimination: Goal: Will remain free from infection Outcome: Progressing Goal: Ability to achieve and maintain adequate urine output Outcome: Progressing   "

## 2024-02-23 NOTE — Addendum Note (Signed)
 Addendum  created 02/23/24 1657 by Vicci Camellia Glatter, MD   Attestation recorded in [facility], Intraprocedure Attestations filed

## 2024-02-23 NOTE — Progress Notes (Signed)
 PHARMACY - TOTAL PARENTERAL NUTRITION CONSULT NOTE   Indication: massive bowel resection   Patient Measurements: Height: 4' 11 (149.9 cm) Weight: 63.2 kg (139 lb 5.3 oz) IBW/kg (Calculated) : 43.2 TPN AdjBW (KG): 46.9 Body mass index is 28.14 kg/m.  Assessment:  79 y/o female with h/o IDA, B12 deficiency, Cameron erosions, DM, large hiatal hernia, MDD, HTN, CHF, CAD, HLD, diverticulosis, PUD, breast cancer s/p mastectomy, GERD, pulmonary hypertension and chronic constipation who is admitted with AKI and septic shock secondary to peritonitis, SBO with bowel necrosis and internal hernia now s/p exploratory laparotomy, lysis of adhesions, reduction of internal hernia, small bowel resection (~18-20cm mid bowel) and placement of ABThera negative pressure wound VAC 1/29.   Glucose / Insulin :  --Hx controlled DM, last A1c 5.6% --Overnight CBG < 10 but could be erroneous. All other readings within goal range BG < 100 --Solu-cortef  50 mg q6h started 02/21/24 Electrolytes: Within normal limits Renal: SCr stable at 0.9 Hepatic: Transaminitis resolving. Tbili stable Intake / Output; MIVF: N/A GI Imaging: No new pertinent imaging studies  GI Surgeries / Procedures: s/p exploratory laparotomy, lysis of adhesions, reduction of internal hernia, small bowel resection, placement of ABThera negative pressure wound VAC 1/29  Central access: 02/21/24 TPN start date: 02/21/24  Nutritional Goals: Goal TPN rate is 60 mL/hr (provides 80.6 g of protein and 1601 kcals per day)  RD Assessment: Estimated Needs Total Energy Estimated Needs: 1400-1600kcal/day Total Protein Estimated Needs: 70-80g/day Total Fluid Estimated Needs: 1.3-1.5L/day  Current Nutrition:  NPO Propofol  off Per RN, surgery may start trickle feeds  Plan:  --Continue TPN to 49mL/hr at 1800 --Electrolytes in TPN (standard): Na 50mEq/L, K 47mEq/L, Ca 24mEq/L, Mg 29mEq/L, and Phos 15mmol/L. Cl:Ac 1:1 --Add standard MVI and trace  elements to TPN --Continue sSSI for now and adjust as needed  --Monitor TPN labs on Mon/Thurs, daily until stable  Ashley Ryan 02/23/2024,8:00 AM

## 2024-02-24 ENCOUNTER — Inpatient Hospital Stay

## 2024-02-24 DIAGNOSIS — K56609 Unspecified intestinal obstruction, unspecified as to partial versus complete obstruction: Secondary | ICD-10-CM | POA: Diagnosis not present

## 2024-02-24 DIAGNOSIS — K7200 Acute and subacute hepatic failure without coma: Secondary | ICD-10-CM | POA: Diagnosis not present

## 2024-02-24 DIAGNOSIS — A419 Sepsis, unspecified organism: Secondary | ICD-10-CM | POA: Diagnosis not present

## 2024-02-24 DIAGNOSIS — E119 Type 2 diabetes mellitus without complications: Secondary | ICD-10-CM | POA: Diagnosis not present

## 2024-02-24 DIAGNOSIS — G9341 Metabolic encephalopathy: Secondary | ICD-10-CM | POA: Diagnosis not present

## 2024-02-24 LAB — CBC
HCT: 28.7 % — ABNORMAL LOW (ref 36.0–46.0)
Hemoglobin: 9.4 g/dL — ABNORMAL LOW (ref 12.0–15.0)
MCH: 28.1 pg (ref 26.0–34.0)
MCHC: 32.8 g/dL (ref 30.0–36.0)
MCV: 85.9 fL (ref 80.0–100.0)
Platelets: 56 10*3/uL — ABNORMAL LOW (ref 150–400)
RBC: 3.34 MIL/uL — ABNORMAL LOW (ref 3.87–5.11)
RDW: 20.2 % — ABNORMAL HIGH (ref 11.5–15.5)
WBC: 20 10*3/uL — ABNORMAL HIGH (ref 4.0–10.5)
nRBC: 0.2 % (ref 0.0–0.2)

## 2024-02-24 LAB — PHOSPHORUS: Phosphorus: 3.7 mg/dL (ref 2.5–4.6)

## 2024-02-24 LAB — BASIC METABOLIC PANEL WITH GFR
Anion gap: 12 (ref 5–15)
BUN: 48 mg/dL — ABNORMAL HIGH (ref 8–23)
CO2: 25 mmol/L (ref 22–32)
Calcium: 8.1 mg/dL — ABNORMAL LOW (ref 8.9–10.3)
Chloride: 102 mmol/L (ref 98–111)
Creatinine, Ser: 1.03 mg/dL — ABNORMAL HIGH (ref 0.44–1.00)
GFR, Estimated: 55 mL/min — ABNORMAL LOW
Glucose, Bld: 112 mg/dL — ABNORMAL HIGH (ref 70–99)
Potassium: 3.7 mmol/L (ref 3.5–5.1)
Sodium: 139 mmol/L (ref 135–145)

## 2024-02-24 LAB — GLUCOSE, CAPILLARY
Glucose-Capillary: 103 mg/dL — ABNORMAL HIGH (ref 70–99)
Glucose-Capillary: 112 mg/dL — ABNORMAL HIGH (ref 70–99)
Glucose-Capillary: 115 mg/dL — ABNORMAL HIGH (ref 70–99)
Glucose-Capillary: 117 mg/dL — ABNORMAL HIGH (ref 70–99)
Glucose-Capillary: 117 mg/dL — ABNORMAL HIGH (ref 70–99)
Glucose-Capillary: 122 mg/dL — ABNORMAL HIGH (ref 70–99)
Glucose-Capillary: 94 mg/dL (ref 70–99)

## 2024-02-24 LAB — MAGNESIUM: Magnesium: 2.2 mg/dL (ref 1.7–2.4)

## 2024-02-24 LAB — PROCALCITONIN: Procalcitonin: 18.1 ng/mL

## 2024-02-24 MED ORDER — VANCOMYCIN HCL 1250 MG/250ML IV SOLN
1250.0000 mg | INTRAVENOUS | Status: DC
  Administered 2024-02-24: 1250 mg via INTRAVENOUS
  Filled 2024-02-24 (×2): qty 250

## 2024-02-24 MED ORDER — FUROSEMIDE 10 MG/ML IJ SOLN
40.0000 mg | Freq: Two times a day (BID) | INTRAMUSCULAR | Status: DC
  Administered 2024-02-24 – 2024-02-25 (×2): 40 mg via INTRAVENOUS
  Filled 2024-02-24 (×2): qty 4

## 2024-02-24 MED ORDER — TRACE MINERALS CU-MN-SE-ZN 300-55-60-3000 MCG/ML IV SOLN
INTRAVENOUS | Status: AC
  Filled 2024-02-24: qty 537.6

## 2024-02-24 MED ORDER — FUROSEMIDE 10 MG/ML IJ SOLN: 40.0000 mg | Freq: Once | INTRAMUSCULAR | Status: DC

## 2024-02-24 NOTE — Progress Notes (Signed)
 Nutrition Follow-up  DOCUMENTATION CODES:   Not applicable  INTERVENTION:   -TPN management per pharmacy  NUTRITION DIAGNOSIS:   Inadequate oral intake related to inability to eat (pt sedated and ventilated) as evidenced by NPO status.  Ongoing  GOAL:   Provide needs based on ASPEN/SCCM guidelines  Met with TPN  MONITOR:   Vent status, Labs, Weight trends, Skin, I & O's  REASON FOR ASSESSMENT:   Ventilator    ASSESSMENT:   79 y/o female with h/o IDA, B12 deficiency, Cameron erosions, DM, large hiatal hernia, MDD, HTN, CHF, CAD, HLD, diverticulosis, PUD, breast cancer s/p mastectomy, GERD, pulmonary hypertension and chronic constipation who is admitted with AKI and septic shock secondary to peritonitis, SBO with bowel necrosis and internal hernia now s/p exploratory laparotomy, lysis of adhesions, reduction of internal hernia, small bowel resection (~18-20cm mid bowel) and placement of ABThera negative pressure wound VAC 1/29.  1/28- NGT placed, KUB confirmed placement- Nasogastric tube terminates within a large hiatal hernia in the right lower Chest 1/29- Procedure: Exploratory laparotomy, lysis of adhesions for 30 minutes, reduction of internal hernia, small bowel resection, placement of ABThera negative pressure wound VAC measuring greater than 50 sq cm   Patient is currently intubated on ventilator support MV: 11.1 L/min Temp (24hrs), Avg:98.8 F (37.1 C), Min:97.9 F (36.6 C), Max:99.7 F (37.6 C)  Reviewed I/O's: +4.7 L x 24 hours and +15.4 L since admission  UOP: 1.2 L x 24 hours  NGT output: 115 ml x 24 hours  Per general surgery notes, patient with minimal NGT output. CT revealed free fluid in abdomen but no drainable collection. Plan to continue NGT to suction.   Case discussed with RN, MD, and during ICU rounds. Patient underwent MRI today to assess mental status. Plan to continue TPN and possibly extubate today.   Case discussed with RN, who reports  her neuro status has remained unchanged. Patient did not interact with RD despite being off sedation. Awaiting MRI results. Palliative care following for goals of care discussions.   Patient receiving TPN for sole source nutrition. TPN infusing at 60 ml/hr, which provides 1600 kcals and 81 grams protein, meeting 100% of estimated kcal and protein needs.   Medications reviewed and include protonix , levophed , and zosyn .   Labs reviewed: CBGS: 103-122 (inpatient orders for glycemic control are 0-9 units insulin  aspart every 6 hours). Copper and folate WDL. Vitamin B-12: 3089.   NUTRITION - FOCUSED PHYSICAL EXAM:  Flowsheet Row Most Recent Value  Orbital Region No depletion  Upper Arm Region No depletion  Thoracic and Lumbar Region No depletion  Buccal Region No depletion  Temple Region No depletion  Clavicle Bone Region No depletion  Clavicle and Acromion Bone Region No depletion  Scapular Bone Region No depletion  Dorsal Hand No depletion  Patellar Region No depletion  Anterior Thigh Region No depletion  Posterior Calf Region No depletion  Edema (RD Assessment) Mild  Hair Reviewed  Eyes Reviewed  Mouth Reviewed  Skin Reviewed  Nails Reviewed    Diet Order:   Diet Order             Diet NPO time specified  Diet effective now                   EDUCATION NEEDS:   Not appropriate for education at this time  Skin:  Skin Assessment: Skin Integrity Issues: Skin Integrity Issues:: Wound VAC, Incisions Wound Vac: abdomen Incisions: clsoed abdomen  Last BM:  02/23/24 (type 7)  Height:   Ht Readings from Last 1 Encounters:  02/19/24 4' 11 (1.499 m)    Weight:   Wt Readings from Last 1 Encounters:  02/24/24 61.1 kg    Ideal Body Weight:  45 kg  BMI:  Body mass index is 27.21 kg/m.  Estimated Nutritional Needs:   Kcal:  1400-1600kcal/day  Protein:  70-80g/day  Fluid:  1.3-1.5L/day    Margery ORN, RD, LDN, CDCES Registered Dietitian III Certified  Diabetes Care and Education Specialist If unable to reach this RD, please use RD Inpatient group chat on secure chat between hours of 8am-4 pm daily

## 2024-02-24 NOTE — Plan of Care (Signed)
" °  Problem: Education: Goal: Ability to describe self-care measures that may prevent or decrease complications (Diabetes Survival Skills Education) will improve Outcome: Progressing Goal: Individualized Educational Video(s) Outcome: Progressing   Problem: Coping: Goal: Ability to adjust to condition or change in health will improve Outcome: Progressing   Problem: Fluid Volume: Goal: Ability to maintain a balanced intake and output will improve Outcome: Progressing   Problem: Health Behavior/Discharge Planning: Goal: Ability to identify and utilize available resources and services will improve Outcome: Progressing Goal: Ability to manage health-related needs will improve Outcome: Progressing   Problem: Metabolic: Goal: Ability to maintain appropriate glucose levels will improve Outcome: Progressing   Problem: Nutritional: Goal: Maintenance of adequate nutrition will improve Outcome: Progressing Goal: Progress toward achieving an optimal weight will improve Outcome: Progressing   Problem: Skin Integrity: Goal: Risk for impaired skin integrity will decrease Outcome: Progressing   Problem: Tissue Perfusion: Goal: Adequacy of tissue perfusion will improve Outcome: Progressing   Problem: Education: Goal: Knowledge of General Education information will improve Description: Including pain rating scale, medication(s)/side effects and non-pharmacologic comfort measures Outcome: Progressing   Problem: Health Behavior/Discharge Planning: Goal: Ability to manage health-related needs will improve Outcome: Progressing   Problem: Clinical Measurements: Goal: Ability to maintain clinical measurements within normal limits will improve Outcome: Progressing Goal: Will remain free from infection Outcome: Progressing Goal: Diagnostic test results will improve Outcome: Progressing Goal: Respiratory complications will improve Outcome: Progressing Goal: Cardiovascular complication will  be avoided Outcome: Progressing   Problem: Activity: Goal: Risk for activity intolerance will decrease Outcome: Progressing   Problem: Nutrition: Goal: Adequate nutrition will be maintained Outcome: Progressing   Problem: Coping: Goal: Level of anxiety will decrease Outcome: Progressing   Problem: Elimination: Goal: Will not experience complications related to bowel motility Outcome: Progressing Goal: Will not experience complications related to urinary retention Outcome: Progressing   Problem: Pain Managment: Goal: General experience of comfort will improve and/or be controlled Outcome: Progressing   Problem: Safety: Goal: Ability to remain free from injury will improve Outcome: Progressing   Problem: Skin Integrity: Goal: Risk for impaired skin integrity will decrease Outcome: Progressing   Problem: Activity: Goal: Ability to tolerate increased activity will improve Outcome: Progressing   Problem: Respiratory: Goal: Ability to maintain a clear airway and adequate ventilation will improve Outcome: Progressing   Problem: Role Relationship: Goal: Method of communication will improve Outcome: Progressing   Problem: Education: Goal: Knowledge of the prescribed therapeutic regimen will improve Outcome: Progressing   Problem: Bowel/Gastric: Goal: Gastrointestinal status for postoperative course will improve Outcome: Progressing   Problem: Cardiac: Goal: Ability to maintain an adequate cardiac output Outcome: Progressing Goal: Will show no evidence of cardiac arrhythmias Outcome: Progressing   Problem: Nutritional: Goal: Will attain and maintain optimal nutritional status Outcome: Progressing   Problem: Neurological: Goal: Will regain or maintain usual level of consciousness Outcome: Progressing   Problem: Clinical Measurements: Goal: Ability to maintain clinical measurements within normal limits Outcome: Progressing Goal: Postoperative complications  will be avoided or minimized Outcome: Progressing   Problem: Respiratory: Goal: Will regain and/or maintain adequate ventilation Outcome: Progressing Goal: Respiratory status will improve Outcome: Progressing   Problem: Skin Integrity: Goal: Demonstrates signs of wound healing without infection Outcome: Progressing   Problem: Urinary Elimination: Goal: Will remain free from infection Outcome: Progressing Goal: Ability to achieve and maintain adequate urine output Outcome: Progressing   "

## 2024-02-24 NOTE — Progress Notes (Signed)
 "                                                                                                                                                                                               Palliative Care Progress Note, Assessment & Plan   Patient Name: Ashley Ryan       Date: 02/24/2024 DOB: 02-May-1945  Age: 79 y.o. MRN#: [id number] Attending Physician: Malka Domino, MD Primary Care Physician: Avelina Greig BRAVO, MD Admit Date: 02/18/2024  Subjective: Patient just returned from CT scan.  She is intubated but off sedation since age 68 p.m. last night.  Patient on vent support as she was unable to tolerate SBT when taken to CT.  No family or friends are present at bedside during my visit.  HPI: 79 y.o. 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   Found to have small bowel obstruction--general surgery consulted, conservative management initially with NGT placement and IVF resuscitation, AKI and hypotension (developing sepsis?) 1/29 found to have worsening lactic acidosis, worsening septic shock and hypotension--vasopressors initiated, emergent OR 1/29 s/p exploratory laparotomy, lysis of adhesions, reduction of internal hernia, small bowel resection, wound vac placement 1/30 back to OR for second look laparotomy, small bowel resection, anastomosis and abdominal closure, wound vac placement Remains intubated post procedure and continues to require vasopressor support 1/31 Remains intubated, sedation off for WUA.  Opens eyes to voice but not following commands, NE uptrending, given fluids and added Vasopressin . 2/1On minimal vent support, more awake, SBT for a while but then became labored.  Anemic this am requiring 1 unit PRBC. But no clear signs of bleeding.  Pressors requirements downtrending.   2/2 No significant events  overnight.  Off sedation for WUA, very weak but awake and tracks.  SBT as tolerated, diurese with 40 mg IV Lasix  x1 dose.  Per Surgery may trial trickle feeds.  Off vasopressors, d/c stress dose steroids.   Palliative medicine was consulted for assisting with goals of care conversations.  Following up today for continued support regarding boundaries and goals of medical treatments.  Summary of counseling/coordination of care: Chart review completed prior to meeting patient including:  -Labs: Increase in WBC to 20 from 17.1, hemoglobin able at 9.4, all other labs grossly stable and within normal limits -Vital signs: WNL, remains on vent support -Imaging: CT of abdomen ordered given increase in WBC-free fluid in abdomen but no drainable collection noted, dilated bowel so NGT  to suction as per surgery's recommendations -Orders: MRI pending given decreased mental status/lack of improvement on neurologic assessment  After reviewing the patient's chart and assessing the patient at bedside, I spoke with patient's RN at bedside.  RN endorses patient was unable to tolerate SBT while in CT patient for back to support mode.  Otherwise, RN has not noted any improvement in patient's mental status/neuroassessment.  After visiting with the patient, I spoke with her daughter [name] over the phone.  Brief medical update given.  Did not share she has met with both surgery and CCM this morning and has a full picture of what is going on.  She shares she does not want to go down any rabbit holes.  Discussed remaining hopeful for recovery but also realistic.  Reviewed that we will await discussing potential next steps in patient's plan of care until MRI has been completed.  She was appreciative of my phone call and support.  She continues to except support from PMT.  No change to plan of care at this time.  Awaiting MRI results for further goals and boundaries of care discussions.     Physical Exam Vitals  reviewed.  Constitutional:      Appearance: She is normal weight.  HENT:     Head: Normocephalic.  Cardiovascular:     Rate and Rhythm: Normal rate.     Heart sounds: Normal heart sounds.  Pulmonary:     Comments: Ventilatory support Skin:    General: Skin is warm and dry.  Neurological:     Comments: Off sedation - does not track with eyes, does not move any extremities on command              Recommendations:   Full code remains Awaiting results of MRI to determine next steps Support for patient and family to continue from PMT   I personally spent a total of 25 minutes in the care of the patient today including preparing to see the patient, getting/reviewing separately obtained history, performing a medically appropriate exam/evaluation, counseling and educating, and documenting clinical information in the EHR.   Lamarr L. Arvid, DNP, FNP-BC Palliative Medicine Team   "

## 2024-02-24 NOTE — Consult Note (Signed)
 Pharmacy Antibiotic Note  Ashley Ryan is a 79 y.o. female admitted on 02/18/2024 with septic shock secondary to peritonitis and necrotic small bowel s/p surgical repair. Pharmacy has been consulted for vancomycin  dosing. Patient has been on Zosyn  for intra-abdominal coverage. There is now concern for pneumonia based on increasing white count post-op and imaging.  Plan:  Vancomycin  1.25 g IV q48h --Calculated AUC: 489, Cmin: 9.8 --Scr 1.03, IBW, Vd 0.72 --Daily Scr per protocol. Levels at Css or as indicated  Height: 4' 11 (149.9 cm) Weight: 61.1 kg (134 lb 11.2 oz) IBW/kg (Calculated) : 43.2  Temp (24hrs), Avg:98.8 F (37.1 C), Min:97.9 F (36.6 C), Max:99.7 F (37.6 C)  Recent Labs  Lab 02/20/24 1255 02/20/24 1730 02/20/24 1951 02/21/24 0430 02/21/24 0546 02/21/24 1133 02/22/24 0400 02/23/24 0500 02/24/24 0407 02/24/24 0730  WBC 4.7 5.5  --  6.4  --   --  12.3* 17.1* 20.0*  --   CREATININE  --  1.12*  --  1.15*  --   --  0.91  0.92 0.94  --  1.03*  LATICACIDVEN 6.3*  --  8.3*  --  6.9* 5.4* 4.5*  --   --   --     Estimated Creatinine Clearance: 34.7 mL/min (A) (by C-G formula based on SCr of 1.03 mg/dL (H)).    Allergies[1]  Antimicrobials this admission: Zosyn  1/28 >>  Linezolid  1/29 >> 1/29 Vancomycin  2/3 >>  Dose adjustments this admission: N/A  Microbiology results: 1/28 BCx: NGTD 1/29 MRSA PCR: (+) 2/3 Sputum: pending   Thank you for allowing pharmacy to be a part of this patients care.  Ashley Ryan 02/24/2024 12:00 PM    [1]  Allergies Allergen Reactions   Oxycodone  Nausea Only   Sulfa Antibiotics Rash    Childhood reaction   Sulfonamide Derivatives Rash    Childhood reaction.

## 2024-02-24 NOTE — Plan of Care (Signed)
" °  Problem: Education: Goal: Ability to describe self-care measures that may prevent or decrease complications (Diabetes Survival Skills Education) will improve Outcome: Progressing Goal: Individualized Educational Video(s) Outcome: Progressing   Problem: Coping: Goal: Ability to adjust to condition or change in health will improve Outcome: Progressing   Problem: Fluid Volume: Goal: Ability to maintain a balanced intake and output will improve Outcome: Progressing   Problem: Health Behavior/Discharge Planning: Goal: Ability to identify and utilize available resources and services will improve Outcome: Progressing Goal: Ability to manage health-related needs will improve Outcome: Progressing   Problem: Metabolic: Goal: Ability to maintain appropriate glucose levels will improve Outcome: Progressing   Problem: Nutritional: Goal: Maintenance of adequate nutrition will improve Outcome: Progressing Goal: Progress toward achieving an optimal weight will improve Outcome: Progressing   Problem: Skin Integrity: Goal: Risk for impaired skin integrity will decrease Outcome: Progressing   Problem: Tissue Perfusion: Goal: Adequacy of tissue perfusion will improve Outcome: Progressing   Problem: Education: Goal: Knowledge of General Education information will improve Description: Including pain rating scale, medication(s)/side effects and non-pharmacologic comfort measures Outcome: Progressing   Problem: Health Behavior/Discharge Planning: Goal: Ability to manage health-related needs will improve Outcome: Progressing   Problem: Clinical Measurements: Goal: Ability to maintain clinical measurements within normal limits will improve Outcome: Progressing Goal: Will remain free from infection Outcome: Progressing Goal: Diagnostic test results will improve Outcome: Progressing Goal: Respiratory complications will improve Outcome: Progressing Goal: Cardiovascular complication will  be avoided Outcome: Progressing   Problem: Activity: Goal: Risk for activity intolerance will decrease Outcome: Progressing   Problem: Nutrition: Goal: Adequate nutrition will be maintained Outcome: Progressing   Problem: Coping: Goal: Level of anxiety will decrease Outcome: Progressing   Problem: Elimination: Goal: Will not experience complications related to bowel motility Outcome: Progressing Goal: Will not experience complications related to urinary retention Outcome: Progressing   Problem: Pain Managment: Goal: General experience of comfort will improve and/or be controlled Outcome: Progressing   Problem: Safety: Goal: Ability to remain free from injury will improve Outcome: Progressing   Problem: Skin Integrity: Goal: Risk for impaired skin integrity will decrease Outcome: Progressing   Problem: Activity: Goal: Ability to tolerate increased activity will improve Outcome: Progressing   Problem: Respiratory: Goal: Ability to maintain a clear airway and adequate ventilation will improve Outcome: Progressing   Problem: Role Relationship: Goal: Method of communication will improve Outcome: Progressing   Problem: Education: Goal: Knowledge of the prescribed therapeutic regimen will improve Outcome: Progressing   Problem: Bowel/Gastric: Goal: Gastrointestinal status for postoperative course will improve Outcome: Progressing   Problem: Cardiac: Goal: Ability to maintain an adequate cardiac output Outcome: Progressing Goal: Will show no evidence of cardiac arrhythmias Outcome: Progressing   "

## 2024-02-25 DIAGNOSIS — A419 Sepsis, unspecified organism: Secondary | ICD-10-CM | POA: Diagnosis not present

## 2024-02-25 DIAGNOSIS — R7989 Other specified abnormal findings of blood chemistry: Secondary | ICD-10-CM | POA: Diagnosis not present

## 2024-02-25 DIAGNOSIS — E876 Hypokalemia: Secondary | ICD-10-CM

## 2024-02-25 DIAGNOSIS — E119 Type 2 diabetes mellitus without complications: Secondary | ICD-10-CM | POA: Diagnosis not present

## 2024-02-25 DIAGNOSIS — R6520 Severe sepsis without septic shock: Secondary | ICD-10-CM

## 2024-02-25 DIAGNOSIS — G9341 Metabolic encephalopathy: Secondary | ICD-10-CM | POA: Diagnosis not present

## 2024-02-25 DIAGNOSIS — I5032 Chronic diastolic (congestive) heart failure: Secondary | ICD-10-CM | POA: Diagnosis not present

## 2024-02-25 DIAGNOSIS — K56609 Unspecified intestinal obstruction, unspecified as to partial versus complete obstruction: Secondary | ICD-10-CM | POA: Diagnosis not present

## 2024-02-25 LAB — HEPATIC FUNCTION PANEL
ALT: 153 U/L — ABNORMAL HIGH (ref 0–44)
AST: 71 U/L — ABNORMAL HIGH (ref 15–41)
Albumin: 2.5 g/dL — ABNORMAL LOW (ref 3.5–5.0)
Alkaline Phosphatase: 148 U/L — ABNORMAL HIGH (ref 38–126)
Bilirubin, Direct: 1.1 mg/dL — ABNORMAL HIGH (ref 0.0–0.2)
Indirect Bilirubin: 0.4 mg/dL (ref 0.3–0.9)
Total Bilirubin: 1.5 mg/dL — ABNORMAL HIGH (ref 0.0–1.2)
Total Protein: 5.4 g/dL — ABNORMAL LOW (ref 6.5–8.1)

## 2024-02-25 LAB — PHOSPHORUS: Phosphorus: 4.2 mg/dL (ref 2.5–4.6)

## 2024-02-25 LAB — BASIC METABOLIC PANEL WITH GFR
Anion gap: 16 — ABNORMAL HIGH (ref 5–15)
Anion gap: 15 (ref 5–15)
BUN: 53 mg/dL — ABNORMAL HIGH (ref 8–23)
BUN: 56 mg/dL — ABNORMAL HIGH (ref 8–23)
CO2: 25 mmol/L (ref 22–32)
CO2: 25 mmol/L (ref 22–32)
Calcium: 8.1 mg/dL — ABNORMAL LOW (ref 8.9–10.3)
Calcium: 8.4 mg/dL — ABNORMAL LOW (ref 8.9–10.3)
Chloride: 105 mmol/L (ref 98–111)
Chloride: 107 mmol/L (ref 98–111)
Creatinine, Ser: 0.92 mg/dL (ref 0.44–1.00)
Creatinine, Ser: 0.96 mg/dL (ref 0.44–1.00)
GFR, Estimated: >60 mL/min
GFR, Estimated: 60 mL/min — ABNORMAL LOW
Glucose, Bld: 127 mg/dL — ABNORMAL HIGH (ref 70–99)
Glucose, Bld: 147 mg/dL — ABNORMAL HIGH (ref 70–99)
Potassium: 2.8 mmol/L — ABNORMAL LOW (ref 3.5–5.1)
Potassium: 3.9 mmol/L (ref 3.5–5.1)
Sodium: 145 mmol/L (ref 135–145)
Sodium: 147 mmol/L — ABNORMAL HIGH (ref 135–145)

## 2024-02-25 LAB — GLUCOSE, CAPILLARY
Glucose-Capillary: 117 mg/dL — ABNORMAL HIGH (ref 70–99)
Glucose-Capillary: 102 mg/dL — ABNORMAL HIGH (ref 70–99)
Glucose-Capillary: 134 mg/dL — ABNORMAL HIGH (ref 70–99)
Glucose-Capillary: 132 mg/dL — ABNORMAL HIGH (ref 70–99)
Glucose-Capillary: 141 mg/dL — ABNORMAL HIGH (ref 70–99)
Glucose-Capillary: 133 mg/dL — ABNORMAL HIGH (ref 70–99)
Glucose-Capillary: 128 mg/dL — ABNORMAL HIGH (ref 70–99)

## 2024-02-25 LAB — CBC
HCT: 30.9 % — ABNORMAL LOW (ref 36.0–46.0)
Hemoglobin: 10 g/dL — ABNORMAL LOW (ref 12.0–15.0)
MCH: 26.6 pg (ref 26.0–34.0)
MCHC: 32.4 g/dL (ref 30.0–36.0)
MCV: 82.2 fL (ref 80.0–100.0)
Platelets: 69 10*3/uL — ABNORMAL LOW (ref 150–400)
RBC: 3.76 MIL/uL — ABNORMAL LOW (ref 3.87–5.11)
RDW: 19.1 % — ABNORMAL HIGH (ref 11.5–15.5)
WBC: 19.7 10*3/uL — ABNORMAL HIGH (ref 4.0–10.5)
nRBC: 0.3 % — ABNORMAL HIGH (ref 0.0–0.2)

## 2024-02-25 LAB — SURGICAL PATHOLOGY

## 2024-02-25 LAB — MAGNESIUM
Magnesium: 1.9 mg/dL (ref 1.7–2.4)
Magnesium: 2.5 mg/dL — ABNORMAL HIGH (ref 1.7–2.4)

## 2024-02-25 LAB — TROPONIN T, HIGH SENSITIVITY: Troponin T High Sensitivity: 74 ng/L — ABNORMAL HIGH (ref 0–19)

## 2024-02-25 MED ORDER — TRACE MINERALS CU-MN-SE-ZN 300-55-60-3000 MCG/ML IV SOLN
INTRAVENOUS | Status: AC
  Filled 2024-02-25: qty 537.6

## 2024-02-25 MED ORDER — HEPARIN SODIUM (PORCINE) 5000 UNIT/ML IJ SOLN
5000.0000 [IU] | Freq: Two times a day (BID) | INTRAMUSCULAR | Status: AC
  Administered 2024-02-25 – 2024-02-27 (×5): 5000 [IU] via SUBCUTANEOUS
  Filled 2024-02-25 (×5): qty 1

## 2024-02-25 MED ORDER — MAGNESIUM SULFATE 2 GM/50ML IV SOLN
2.0000 g | Freq: Once | INTRAVENOUS | Status: AC
  Administered 2024-02-25: 2 g via INTRAVENOUS
  Filled 2024-02-25: qty 50

## 2024-02-25 MED ORDER — ACETAMINOPHEN 10 MG/ML IV SOLN
1000.0000 mg | Freq: Once | INTRAVENOUS | Status: AC
  Administered 2024-02-25: 1000 mg via INTRAVENOUS
  Filled 2024-02-25: qty 100

## 2024-02-25 MED ORDER — POTASSIUM CHLORIDE 10 MEQ/50ML IV SOLN
10.0000 meq | INTRAVENOUS | Status: AC
  Administered 2024-02-25 (×8): 10 meq via INTRAVENOUS
  Filled 2024-02-25 (×8): qty 50

## 2024-02-25 NOTE — Progress Notes (Signed)
 "                                                                                                                                                                                               Palliative Care Progress Note, Assessment & Plan   Patient Name: Ashley Ryan       Date: 02/25/2024 DOB: [birth year]  Age: 79 y.o. MRN#: [id number] Attending Physician: Malka Domino, MD Primary Care Physician: Avelina Greig BRAVO, MD Admit Date: 02/18/2024  Subjective: Patient is lying in bed on ventilatory support.  She is off sedation.  No family or friends are present at bedside during my visit.  HPI: 79 y.o. 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   Found to have small bowel obstruction--general surgery consulted, conservative management initially with NGT placement and IVF resuscitation, AKI and hypotension (developing sepsis?) 1/29 found to have worsening lactic acidosis, worsening septic shock and hypotension--vasopressors initiated, emergent OR 1/29 s/p exploratory laparotomy, lysis of adhesions, reduction of internal hernia, small bowel resection, wound vac placement 1/30 back to OR for second look laparotomy, small bowel resection, anastomosis and abdominal closure, wound vac placement Remains intubated post procedure and continues to require vasopressor support 1/31 Remains intubated, sedation off for WUA.  Opens eyes to voice but not following commands, NE uptrending, given fluids and added Vasopressin . 2/1On minimal vent support, more awake, SBT for a while but then became labored.  Anemic this am requiring 1 unit PRBC. But no clear signs of bleeding.  Pressors requirements downtrending.   2/2 No significant events overnight.  Off sedation for WUA, very weak but awake and tracks.  SBT as tolerated, diurese with 40 mg IV  Lasix  x1 dose.  Per Surgery may trial trickle feeds.  Off vasopressors, d/c stress dose steroids. 2/3 No acute events overnight.  Hemodynamically stable, no vasopressors.  On minimal vent support, WUA/SBT as tolerated.  Remains encephalopathic and not following commands, obtain MRI Brain.  With worsening Leukocytosis, repeat CT Abdomen/Pelvis with concern for adynamic ileus, small volume pelvic fluid but NO drainable abscess, and increased bilateral pleural effusions and progressive bibasilar airspace disease concerning for atelectasis vs pneumonia.  Will obtain PCT, tracheal aspirate, and add Vancomycin  due to previously + MRSA PCR. 2/4 MRI Brain negative pt remains mechanically intubated on minimal settings.  She is able to track but unable to follow commands precluding extubation.  Pt now having diarrhea rectal pouch place GI panel ordered  Palliative medicine was consulted for assisting with goals of care conversations.  Following up today for continued discussion and support surrounding patient's boundaries and goals of medical treatments.  Summary of counseling/coordination of care: Chart review completed prior to meeting patient including:  -Labs: Hyperkalemia 2.8, WBC still elevated at 19.7, hemoglobin mildly low but stable at 10.0 -Vital signs: Tachycardic -Progress notes: As per CCM recommendations, plan is to not resume TF as NG has had significant output d/t likely post op adynamic ileus -Imaging: MRI of the brain revealed no acute intracranial abnormality, mild chronic small vessel ischemic disease, and cerebral atrophy  After reviewing the patient's chart and assessing the patient at bedside, I attempted to speak with patient.  She has been off sedation for greater than 24 hours.  However, she does not meet my gaze or track me.  She makes no attempts to respond during neuroassessment.  She remains unable to participate in goals of care or medical decision making independently at this  time.  After visiting with the patient, I counseled with RN.  RN confirms that tube feeds have been held given significant NG output.  Her neuroassessment aligns with - Patient was able to track her this morning but has not had significant neuro responses that has been repetitive and purposeful since this morning.  Plan remains to continue watchful waiting and time for outcomes.   No acute palliative needs at this time.  Plan remains to continue awaiting time for outcomes.  No adjustment to plan of care.  Physical Exam Vitals reviewed.  HENT:     Head: Normocephalic.     Mouth/Throat:     Mouth: Mucous membranes are moist.  Cardiovascular:     Rate and Rhythm: Tachycardia present.  Pulmonary:     Comments: Ventilatory support Skin:    General: Skin is warm and dry.     Coloration: Skin is pale.  Neurological:     Comments: Not responsive to verbal stimuli              Recommendations:   Full code remains Watchful waiting TFs remain held given high NG output  I personally spent a total of 25 minutes in the care of the patient today including preparing to see the patient, getting/reviewing separately obtained history, performing a medically appropriate exam/evaluation, referring and communicating with other health care professionals, and documenting clinical information in the EHR.   Lamarr L. Arvid, DNP, FNP-BC Palliative Medicine Team   "

## 2024-02-25 NOTE — Progress Notes (Signed)
 CC: SBO Subjective: MRI negative for acute pathology Having diarrhea Tracks with eyes but does not follow commands  Objective: Vital signs in last 24 hours: Temp:  [99 F (37.2 C)-100 F (37.8 C)] 100 F (37.8 C) (02/04 1342) Pulse Rate:  [92-108] 106 (02/04 1342) Resp:  [20-31] 28 (02/04 1342) BP: (102-136)/(56-76) 116/65 (02/04 1300) SpO2:  [95 %-100 %] 97 % (02/04 1342) FiO2 (%):  [28 %] 28 % (02/04 1342) Weight:  [61.2 kg] 61.2 kg (02/04 0500) Last BM Date : 02/25/24  Intake/Output from previous day: 02/03 0701 - 02/04 0700 In: 1736.9 [I.V.:1336.9; IV Piggyback:400.1] Out: 5500 [Urine:4150; Emesis/NG output:1350] Intake/Output this shift: Total I/O In: 734.2 [I.V.:419.9; IV Piggyback:314.3] Out: 850 [Urine:850]  Physical exam:  Abdomen is soft, non-distende today , prevena in place Ngt with light gastric output  Lab Results: CBC  Recent Labs    02/24/24 0407 02/25/24 0337  WBC 20.0* 19.7*  HGB 9.4* 10.0*  HCT 28.7* 30.9*  PLT 56* 69*   BMET Recent Labs    02/24/24 0730 02/25/24 0337  NA 139 145  K 3.7 2.8*  CL 102 105  CO2 25 25  GLUCOSE 112* 127*  BUN 48* 53*  CREATININE 1.03* 0.92  CALCIUM  8.1* 8.1*   PT/INR Recent Labs    02/23/24 0500  LABPROT 15.2  INR 1.1   ABG Recent Labs    02/23/24 1401  PHART 7.43  HCO3 28.5*    Studies/Results: MR BRAIN WO CONTRAST Result Date: 02/24/2024 EXAM: MRI BRAIN WITHOUT CONTRAST 02/24/2024 04:14:33 PM TECHNIQUE: Multiplanar multisequence MRI of the head/brain was performed without the administration of intravenous contrast. COMPARISON: CT head 02/21/2024. CLINICAL HISTORY: Mental status change, unknown cause. FINDINGS: The examination is mildly to moderately motion degraded. BRAIN AND VENTRICLES: There is no evidence of an acute infarct, intracranial hemorrhage, mass, midline shift, hydrocephalus, or extra-axial fluid collection. T2 hyperintensities in the cerebral white matter bilaterally are  nonspecific but compatible with mild chronic small vessel ischemic disease. There is mild generalized cerebral atrophy. Normal flow voids. ORBITS: Bilateral cataract extraction. SINUSES AND MASTOIDS: Bilateral mastoid fluid. Clear paranasal sinuses. BONES AND SOFT TISSUES: Normal marrow signal. Partially visualized endotracheal tube. IMPRESSION: 1. No acute intracranial abnormality. 2. Mild chronic small vessel ischemic disease. 3. Cerebral Atrophy (ICD10-G31.9). Electronically signed by: Dasie Hamburg MD 02/24/2024 05:06 PM EST RP Workstation: [id number]   CT ABDOMEN PELVIS WO CONTRAST Result Date: 02/24/2024 CLINICAL DATA:  Sepsis. Prior laparotomy on 02/20/2024. Bowel resection 02/19/2024. EXAM: CT ABDOMEN AND PELVIS WITHOUT CONTRAST TECHNIQUE: Multidetector CT imaging of the abdomen and pelvis was performed following the standard protocol without IV contrast. RADIATION DOSE REDUCTION: This exam was performed according to the departmental dose-optimization program which includes automated exposure control, adjustment of the mA and/or kV according to patient size and/or use of iterative reconstruction technique. COMPARISON:  02/19/2024. FINDINGS: Multifactorial degradation, including lack of oral/IV contrast, arm position, EKG wires and leads, minimal motion. Lower chest: Increased bibasilar airspace disease. Mild cardiomegaly. Incompletely imaged central line, terminating in the right atrium. Small bilateral pleural effusions, increased on the left and new or increased on the right. Large hiatal hernia, with greater than 1/2 of the stomach positioned in the chest. Hepatobiliary: Mild hepatomegaly. Gallbladder not visualized, likely surgically absent. No biliary duct dilatation. Pancreas: Normal, without mass or ductal dilatation. Spleen: Normal in size, without focal abnormality. Adrenals/Urinary Tract: Normal adrenal glands. No renal calculi or hydronephrosis. No hydroureter or ureteric calculi. Foley catheter  and air within the bladder.  Stomach/Bowel: Nasogastric has been advanced, terminating in the intra-abdominal gastric antrum or pylorus. Extensive colonic diverticulosis.  Normal caliber of the colon. Fluid-filled proximal small bowel loops at up to 2.9 cm. Upper pelvic midline enterotomy. No small bowel transition point identified. Vascular/Lymphatic: Aortic atherosclerosis. No abdominopelvic adenopathy. Reproductive: Hysterectomy.  No adnexal mass. Other: No free intraperitoneal air. Trace fluid in the left pericolic gutter on 29/2. Small volume pelvic fluid, including within the left pelvic small bowel mesentery on 63/2. No drainable abscess. Anasarca. New midline abdominopelvic laparotomy staples. Musculoskeletal: Old bilateral rib fractures. T10 vertebral augmentation secondary to a severe compression deformity. IMPRESSION: 1. Multifactorial degradation as detailed above. 2. Interval midline laparotomy and enterotomy. Proximal small bowel loops are mildly dilated, fluid-filled, without transition point. Favor postoperative adynamic ileus. 3. Small volume pelvic fluid, without drainable abscess. 4. Increased bilateral pleural effusions with progressive bibasilar airspace disease which could represent atelectasis or infection. 5. Diffuse anasarca, suggesting fluid overload. 6. Large hiatal hernia. Nasogastric tube advanced into the intrathoracic gastric antrum. 7.  Aortic Atherosclerosis (ICD10-I70.0). Electronically Signed   By: Rockey Kilts M.D.   On: 02/24/2024 10:57   DG Chest Port 1 View Result Date: 02/24/2024 CLINICAL DATA:  Acute respiratory failure with hypoxia. EXAM: PORTABLE CHEST 1 VIEW COMPARISON:  02/20/2024 FINDINGS: Endotracheal tube tip is 1.7 cm above the base of the carina. Large hiatal hernia again noted. The NG tube passes into the stomach although the distal tip position is not included on the film. Left PICC line tip overlies the lower SVC level. The cardio pericardial silhouette is  enlarged. Bibasilar collapse/consolidation is similar to prior with small bilateral pleural effusions. Telemetry leads overlie the chest. IMPRESSION: 1. Endotracheal tube tip is 1.7 cm above the base of the carina. 2. No substantial change in bibasilar collapse/consolidation with small bilateral pleural effusions. Electronically Signed   By: Camellia Candle M.D.   On: 02/24/2024 07:45    Anti-infectives: Anti-infectives (From admission, onward)    Start     Dose/Rate Route Frequency Ordered Stop   02/24/24 1400  vancomycin  (VANCOREADY) IVPB 1250 mg/250 mL        1,250 mg 166.7 mL/hr over 90 Minutes Intravenous Every 48 hours 02/24/24 1159     02/19/24 1200  linezolid  (ZYVOX ) IVPB 600 mg  Status:  Discontinued        600 mg 300 mL/hr over 60 Minutes Intravenous Every 12 hours 02/19/24 1057 02/20/24 1053   02/18/24 2200  piperacillin -tazobactam (ZOSYN ) IVPB 3.375 g        3.375 g 12.5 mL/hr over 240 Minutes Intravenous Every 8 hours 02/18/24 1826     02/18/24 1445  piperacillin -tazobactam (ZOSYN ) IVPB 3.375 g        3.375 g 100 mL/hr over 30 Minutes Intravenous  Once 02/18/24 1439 02/18/24 1524       Assessment/Plan:  S/p ex-lap with reduction of internal hernia, SBR, abthere placement on 1/29, back to or ON 1/30 for further resection, anastamosis and closure  Having diarrhea Leukocytosis slightly improved Off pressors, may need trach  Okay to start trickle feeds today Keep prevena  Continue TPN   Jayson Endow, M.D. [facility]

## 2024-02-25 NOTE — Progress Notes (Signed)
 SPIRITUAL CARE AND COUNSELING CONSULT NOTE   VISIT SUMMARY Patient intubated post surgery and unresponsive. Talked with daughter.    SPIRITUAL ENCOUNTER                                                                                                                                                                      Type of Visit: Initial Care provided to:: Family, Pt not available Conversation partners present during encounter: Nurse Reason for visit: Surgical OnCall Visit: No   SPIRITUAL FRAMEWORK  Presenting Themes: Significant life change Community/Connection: Family Family Stress Factors: Major life changes   GOALS       INTERVENTIONS   Spiritual Care Interventions Made: Compassionate presence, Established relationship of care and support, Reflective listening    INTERVENTION OUTCOMES   Outcomes: Awareness of support, Patient family open to resources  SPIRITUAL CARE PLAN   Spiritual Care Issues Still Outstanding: Ashley Ryan will continue to follow    If immediate needs arise, please contact [facility] 24 hour on call [phone]   Barnie JINNY Record, Chaplain  02/25/2024 4:14 PM

## 2024-02-25 NOTE — Progress Notes (Signed)
 PHARMACY - TOTAL PARENTERAL NUTRITION CONSULT NOTE   Indication: massive bowel resection   Patient Measurements: Height: 4' 11 (149.9 cm) Weight: 61.2 kg (134 lb 14.7 oz) IBW/kg (Calculated) : 43.2 TPN AdjBW (KG): 46.9 Body mass index is 27.25 kg/m.  Assessment:  79 y/o female with h/o IDA, B12 deficiency, Cameron erosions, DM, large hiatal hernia, MDD, HTN, CHF, CAD, HLD, diverticulosis, PUD, breast cancer s/p mastectomy, GERD, pulmonary hypertension and chronic constipation who is admitted with AKI and septic shock secondary to peritonitis, SBO with bowel necrosis and internal hernia now s/p exploratory laparotomy, lysis of adhesions, reduction of internal hernia, small bowel resection (~18-20cm mid bowel) and placement of ABThera negative pressure wound VAC 1/29.   Glucose / Insulin :  --Hx controlled DM, last A1c 5.6% --CBGs at goal Electrolytes: Hypokalemia secondary to diuresis Renal: SCr stable around 1 Hepatic: Transaminitis resolving. Tbili stable Intake / Output; MIVF: N/A GI Imaging: No new pertinent imaging studies  GI Surgeries / Procedures: s/p exploratory laparotomy, lysis of adhesions, reduction of internal hernia, small bowel resection, placement of ABThera negative pressure wound VAC 1/29  Central access: 02/21/24 TPN start date: 02/21/24  Nutritional Goals: Goal TPN rate is 60 mL/hr (provides 80.6 g of protein and 1601 kcals per day)  RD Assessment: Estimated Needs Total Energy Estimated Needs: 1400-1600kcal/day Total Protein Estimated Needs: 70-80g/day Total Fluid Estimated Needs: 1.3-1.5L/day  Current Nutrition:  NPO Propofol  off Hold further diuresis for now given hypokalemia  Plan:  --Continue TPN to 60 mL/hr at 1800 --Electrolytes in TPN (standard): Na 50mEq/L, K 13mEq/L, Ca 34mEq/L, Mg 6mEq/L, and Phos 15mmol/L. Cl:Ac 1:1 --Add standard MVI and trace elements to TPN --Continue sSSI for now and adjust as needed  --Monitor TPN labs on Mon/Thurs,  daily until stable  Ashley Ryan 02/25/2024,7:55 AM

## 2024-02-25 NOTE — Progress Notes (Signed)
 "  NAME:  Ashley Ryan, MRN:  [id number], DOB:  May 17, 1945, LOS: 7 ADMISSION DATE:  02/18/2024, CONSULTATION DATE:  02/18/2024 REFERRING MD:  Dr. Fernand, CHIEF COMPLAINT:  Hypotension    Brief Pt Description / Synopsis:  79 y.o. female admitted with Small Bowel Obstruction and AKI.  Course complicated by development of Septic Shock due to Peritonitis and necrotic small bowel, status post ex-lap with reduction of internal hernia, SBR, abthere placement on 1/29, back to or ON 1/30 for further resection, anastamosis and closure.  Remains intubated post procedure.   History of Present Illness:  Ashley Ryan is a 79 y.o. 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  Per patient this started 2 days ago but since yesterday she has been progressively getting worse.  States has not had a bowel movement since 2 days. She has had multiple episodes of nausea and vomiting. Given worsening symptoms, they called EMS to get further care   ED Course: Initial Vital Signs: Temperature 97.8 F, RR 26, pulse 128, BP 73/64, SpO2 100% on room air Significant Labs: leukocytosis to 18.7, normal hemoglobin with elevated hematocrit consistent with hemoconcentration. CMP with moderate hyperglycemia, AKI, ALT and AST elevation. Lipase normal. Troponin mildly elevated but stable.  Imaging Chest X-ray>>IMPRESSION: 1. Significant interval enlargement of the hiatal hernia with diffuse gaseous distension of both the intrathoracic and intra-abdominal portions of the stomach. This suggests the possibility of gastric outlet obstruction. Recommend further evaluation with contrast-enhanced CT scan of the abdomen and pelvis. 2. Slightly increased atelectasis in the right lung surrounding the enlarging hiatal hernia. Otherwise, no acute  cardiopulmonary process. CT Abdomen & Pelvis>>IMPRESSION: 1. High grade small bowel obstruction with obstruction favored to be in the right lower quadrant slash right pelvis . 2. Small amount of free fluid and hazy mesentery within the distal small bowel and along the left pericardial gutter related to small bowel obstruction and venous congestion . 3. No pneumatosis or portal venous gas.no intraperitoneal free air. . 4. Very large hiatal hernia with half the stomach above the hemidiaphragm and markedly distended . Medications Administered: 2.5 L LR boluses, Zosyn , Zofran  and Morphine    She met SIRS criteria, therefore sepsis workup initiated and Antibiotics given. [facility] asked to admit for further workup and treatment.  General Surgery was consulted   Please see Significant Hospital Events section below for full detailed hospital course.   Pertinent  Medical History   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  Mastectomy/ SLN.   Chest pain    a. 10/2017 Cath: Nl cors.   Chronic headache    Chronic heart failure with preserved ejection fraction (HFpEF) (HCC)    a. 10/2021 Echo: EF 60-65%, no rwma, mild LVH, GrI DD, nl RV size/fxn, mild MR.   Complication of anesthesia    prior to 1991 used to have PONV.  none recently.   Diverticulosis of colon    Family history of adverse reaction to anesthesia    mother/daighter get sick   GERD with stricture    Hard of hearing    Hiatal hernia    Hypokalemia    Labile hypertension    a. 10/2017 Cath: nl renal arteries.  Mitral regurgitation    a. 10/2021 Echo: Mild MR.   Mixed hyperlipidemia    Neuropathy    feet   Osteoporosis    Pre-diabetes    Pulmonary hypertension (HCC)    a. 10/2017 Echo: PASP 35-43mmHg.   Wears dentures    full upper    Micro Data:  1/28: Blood cultures x2>>no growth 1/29: MRSA PCR + 2/3: Tracheal  aspirate>>NGTD   Antimicrobials:   Anti-infectives (From admission, onward)    Start     Dose/Rate Route Frequency Ordered Stop   02/24/24 1400  vancomycin  (VANCOREADY) IVPB 1250 mg/250 mL        1,250 mg 166.7 mL/hr over 90 Minutes Intravenous Every 48 hours 02/24/24 1159     02/19/24 1200  linezolid  (ZYVOX ) IVPB 600 mg  Status:  Discontinued        600 mg 300 mL/hr over 60 Minutes Intravenous Every 12 hours 02/19/24 1057 02/20/24 1053   02/18/24 2200  piperacillin -tazobactam (ZOSYN ) IVPB 3.375 g        3.375 g 12.5 mL/hr over 240 Minutes Intravenous Every 8 hours 02/18/24 1826     02/18/24 1445  piperacillin -tazobactam (ZOSYN ) IVPB 3.375 g        3.375 g 100 mL/hr over 30 Minutes Intravenous  Once 02/18/24 1439 02/18/24 1524       Significant Hospital Events: Including procedures, antibiotic start and stop dates in addition to other pertinent events   1/28: Admitted by [facility] for SBO and AKI.  General Surgery consulted.  BP soft, PCCM consulted for potential vasopressors.  1/29: This morning with worsening lactic acidosis and hypotension requiring initiation of Levophed , CT with increase in edema and fluid along mesentery concerning for Peritonitis and necrotic small bowel.  Taken for emergent laparotomy with lysis of adhesions, reduction of internal hernia, small bowel resection, and placement of wound VAC  Remains intubated post procedure, tentative plan to return to OR tomorrow.  1/30: No significant events overnight.   Remains intubated and sedated, NE downtrending, went to OR for Relook. 1/31:  Remains intubated, sedation off for WUA.  Opens eyes to voice but not following commands, NE uptrending, given fluids and added Vasopressin . 2/1: On minimal vent support, more awake, SBT for a while but then became labored.  Anemic this am requiring 1 unit PRBC. But no clear signs of bleeding.  Pressors requirements downtrending.   2/2: No significant events overnight.  Off sedation for WUA,  very weak but awake and tracks.  SBT as tolerated, diurese with 40 mg IV Lasix  x1 dose.  Per Surgery may trial trickle feeds.  Off vasopressors, d/c stress dose steroids.  2/3: No acute events overnight.  Hemodynamically stable, no vasopressors.  On minimal vent support, WUA/SBT as tolerated.  Remains encephalopathic and not following commands, obtain MRI Brain.  With worsening Leukocytosis, repeat CT Abdomen/Pelvis with concern for adynamic ileus, small volume pelvic fluid but NO drainable abscess, and increased bilateral pleural effusions and progressive bibasilar airspace disease concerning for atelectasis vs pneumonia.  Will obtain PCT, tracheal aspirate, and add Vancomycin  due to previously + MRSA PCR. 2/4: MRI Brain negative pt remains mechanically intubated on minimal settings.  She is able to track but unable to follow commands precluding extubation.  Pt now having diarrhea rectal pouch place GI panel ordered   Interim History / Subjective:  As outlined above under Significant Hospital Events section  Objective   Blood pressure 125/71, pulse (!) 106, temperature 100 F (37.8 C), temperature source Oral,  resp. rate (!) 28, height 4' 11 (1.499 m), weight 61.2 kg, SpO2 96%.    Vent Mode: PRVC FiO2 (%):  [28 %] 28 % Set Rate:  [16 bmp] 16 bmp Vt Set:  [400 mL] 400 mL PEEP:  [5 cmH20] 5 cmH20 Plateau Pressure:  [19 cmH20] 19 cmH20   Intake/Output Summary (Last 24 hours) at 02/25/2024 1128 Last data filed at 02/25/2024 1000 Gross per 24 hour  Intake 1945.13 ml  Output 5900 ml  Net -3954.87 ml   Filed Weights   02/23/24 0500 02/24/24 0422 02/25/24 0500  Weight: 63.2 kg 61.1 kg 61.2 kg    Examination: General: Critically ill appearing elderly female, laying in bed, intubated and sedated, NAD HENT: Atraumatic, normocephalic, neck supple, no JVD, orally intubated Lungs: Clear throughout, even, non labored  Cardiovascular: Sinus tachycardia, s1s2, no m/r/g, 2+ radial/1+distal pulses, 1+  bilateral lower extremity edema  Abdomen: Wound vac in place to midline dressing clean/dry intact  Extremities: 1+ edema BLE, no cyanosis, good peripheral perfusion Neuro: LIghtly Sedated, awake but not tracking, not following commands, PERRL GU: foley catheter in place   Resolved Hospital Problem list     Assessment & Plan:  #Acute Metabolic Encephalopathy #Sedation needs in setting of mechanical ventilation  MRI Brain 02/24/24: No acute intracranial abnormality.  Mild chronic small vessel ischemic disease. Cerebral Atrophy (ICD10-G31.9) - Maintain a RASS goal of 0  - Avoid sedating medications as able - Daily wake up assessment - Ammonia is normal, thyroid  panel is pending   #Septic Shock RESOLVED  #HFpEF without acute exacerbation #Mildly elevated Troponin due to demand ischemia PMHx: HTN, HLD, CAD Echocardiogram 02/20/24: LVEF 70-75%, unable to evaluate diastolic parameters, RV systolic function normal, RV size is normal - Continuous telemetry monitoring  - Maintain map >65: no longer requiring vasopressors  - Trend HS Troponin until peaked  - Diuresis as BP and renal function permits  #Post-op Ventilator Management - Full vent support, implement lung protective strategies - Plateau pressures less than 30 cm H20 - Wean FiO2 & PEEP as tolerated to maintain O2 sats >92% - Follow intermittent Chest X-ray & ABG as needed - Spontaneous Breathing Trials when respiratory parameters met and mental status permits - Implement VAP Bundle - Prn Bronchodilators  #Meets SIRS Criteria on presentation (WBC 18.7, RR 31, Pulse 121) #Severe Sepsis #Peritonitis and Small Bowel Necrosis #Concern for developing Pneumonia CT Abd Pelvis 02/24/24: Multifactorial degradation as detailed above. Interval midline laparotomy and enterotomy. Proximal small bowel loops are mildly dilated, fluid-filled, without transition point. Favor postoperative adynamic ileus. Small volume pelvic fluid, without  drainable abscess. Increased bilateral pleural effusions with progressive bibasilar airspace disease which could represent atelectasis or infection. Diffuse anasarca, suggesting fluid overload. Large hiatal hernia. Nasogastric tube advanced into the intrathoracic gastric antrum. Aortic Atherosclerosis (ICD10-I70.0). - Trend wbc and monitor fever curve  - Follow cultures as above - Continue empiric zosyn  and vancomycin  pending cultures & sensitivities  #Small Bowel Obstruction complicated by Small Bowel Necrosis  #Large Hiatal Hernia S/p ex-lap with reduction of internal hernia, SBR, abthere placement on 1/29, back to or ON 1/30 for further resection, anastamosis and closure - NPO - NGT to LIS - TPN - General Surgery following, appreciate input   #Acute Kidney Injury ~ RESOLVED #Hyperkalemia ~ RESOLVED #Hypokalemia  #Anion Gap Metabolic Acidosis due to lactic acidosis ~ RESOLVED  - Trend BMP  - Ensure adequate renal perfusion - Avoid nephrotoxic agents as able - Replace electrolytes as indicated ~ Pharmacy following  for assistance with electrolyte replacement - Strict I&O's   #Shock Liver ~ IMPROVING  - Trend LFT's and coags - Avoid hepatoxic agents   #Diabetes Mellitus Type II - CBG's q6h; Target range of 140 to 180 - SSI - Follow ICU Hypo/Hyperglycemia protocol  Best Practice (right click and Reselect all SmartList Selections daily)   Diet/type: NPO, TPN DVT prophylaxis: SCD GI prophylaxis: PPI Lines: PICC present and still needed  Foley:  yes, and is still needed; will place orders to discontinue  Code Status:  full code Last date of multidisciplinary goals of care discussion [02/25/2024]  2/3: Pt's family updated at bedside regarding pts condition and current plan of care.  All questions answered   Labs   CBC: Recent Labs  Lab 02/21/24 0430 02/22/24 0400 02/23/24 0500 02/24/24 0407 02/25/24 0337  WBC 6.4 12.3* 17.1* 20.0* 19.7*  HGB 10.4* 10.0* 9.3* 9.4* 10.0*   HCT 32.6* 32.1* 28.9* 28.7* 30.9*  MCV 83.6 84.0 82.6 85.9 82.2  PLT 89* 75* 56* 56* 69*    Basic Metabolic Panel: Recent Labs  Lab 02/21/24 0430 02/22/24 0400 02/23/24 0500 02/24/24 0730 02/25/24 0337  NA 135 131*  129* 135 139 145  K 4.2 3.4*  3.4* 3.6 3.7 2.8*  CL 101 96*  95* 99 102 105  CO2 19* 21*  21* 23 25 25   GLUCOSE 68* 208*  204* 123* 112* 127*  BUN 28* 25*  24* 31* 48* 53*  CREATININE 1.15* 0.91  0.92 0.94 1.03* 0.92  CALCIUM  6.9* 6.8*  6.7* 7.4* 8.1* 8.1*  MG 2.4 2.1 2.5* 2.2 1.9  PHOS 4.6 4.0 3.2 3.7 4.2   GFR: Estimated Creatinine Clearance: 38.8 mL/min (by C-G formula based on SCr of 0.92 mg/dL). Recent Labs  Lab 02/19/24 0935 02/19/24 1101 02/20/24 1951 02/21/24 0430 02/21/24 0546 02/21/24 1133 02/22/24 0400 02/23/24 0500 02/24/24 0407 02/24/24 1156 02/25/24 0337  PROCALCITON 100.00  --   --   --   --   --   --   --   --  18.10  --   WBC  --    < >  --    < >  --   --  12.3* 17.1* 20.0*  --  19.7*  LATICACIDVEN  --    < > 8.3*  --  6.9* 5.4* 4.5*  --   --   --   --    < > = values in this interval not displayed.    Liver Function Tests: Recent Labs  Lab 02/19/24 2042 02/20/24 0345 02/21/24 0430 02/22/24 0400 02/23/24 0500  AST 1,244* 1,955* 558* 299* 178*  ALT 1,070* 1,270* 780* 581* 373*  ALKPHOS 54 75 121 154* 155*  BILITOT 1.2 1.4* 2.1* 2.3* 2.3*  PROT 4.4* 4.1* 4.0* 4.1* 4.4*  ALBUMIN  3.4* 3.2* 2.3* 2.2*  2.1* 2.1*   Recent Labs  Lab 02/18/24 1417  LIPASE 17   Recent Labs  Lab 02/23/24 1401  AMMONIA 32    ABG    Component Value Date/Time   PHART 7.43 02/23/2024 1401   PCO2ART 43 02/23/2024 1401   PO2ART 51 (L) 02/23/2024 1401   HCO3 28.5 (H) 02/23/2024 1401   ACIDBASEDEF 0.4 02/19/2024 1057   O2SAT 85.5 02/23/2024 1401     Coagulation Profile: Recent Labs  Lab 02/20/24 0345 02/20/24 1255 02/23/24 0500  INR 2.0* 1.7* 1.1    Cardiac Enzymes: No results for input(s): CKTOTAL, CKMB,  CKMBINDEX, TROPONINI in the last 168 hours.  HbA1C: Hemoglobin A1C  Date/Time Value Ref Range Status  09/16/2023 08:37 AM 5.6 4.0 - 5.6 % Final   Hgb A1c MFr Bld  Date/Time Value Ref Range Status  02/18/2023 08:26 AM 6.0 4.6 - 6.5 % Final    Comment:    Glycemic Control Guidelines for People with Diabetes:Non Diabetic:  <6%Goal of Therapy: <7%Additional Action Suggested:  >8%   02/12/2022 07:36 AM 6.2 4.6 - 6.5 % Final    Comment:    Glycemic Control Guidelines for People with Diabetes:Non Diabetic:  <6%Goal of Therapy: <7%Additional Action Suggested:  >8%     CBG: Recent Labs  Lab 02/24/24 1927 02/24/24 2319 02/25/24 0349 02/25/24 0812 02/25/24 1111  GLUCAP 112* 117* 102* 134* 132*    Review of Systems:   Unable to assess due to intubation/sedation/critical illness   Past Medical History:  She,  has a past medical history of Anemia, Arthritis, Breast cancer (HCC) (06/13/2017), Chest pain, Chronic headache, Chronic heart failure with preserved ejection fraction (HFpEF) (HCC), Complication of anesthesia, Diverticulosis of colon, Family history of adverse reaction to anesthesia, GERD with stricture, Hard of hearing, Hiatal hernia, Hypokalemia, Labile hypertension, Mitral regurgitation, Mixed hyperlipidemia, Neuropathy, Osteoporosis, Pre-diabetes, Pulmonary hypertension (HCC), and Wears dentures.   Surgical History:   Past Surgical History:  Procedure Laterality Date   ABDOMINAL HYSTERECTOMY  1978   one ovary remains   APPLICATION OF WOUND VAC  02/19/2024   Procedure: APPLICATION, WOUND VAC;  Surgeon: Marinda Jayson KIDD, MD;  Location: [facility] ORS;  Service: General;;   BOWEL RESECTION  02/19/2024   Procedure: EXCISION, SMALL INTESTINE;  Surgeon: Marinda Jayson KIDD, MD;  Location: [facility] OR[facility];  Service: General;;   BREAST BIOPSY Left 05/26/2017   Affirm Bx- coil clip Ductal carcinoma in situ, high nuclear grade with calcifications and focal comedonecrosis   CARDIAC CATHETERIZATION      yrs ago all OK.   CATARACT EXTRACTION W/PHACO Left 02/21/2016   Procedure: CATARACT EXTRACTION PHACO AND INTRAOCULAR LENS PLACEMENT (IOC)  Left eye;  Surgeon: Dene Etienne, MD;  Location: [facility];  Service: Ophthalmology;  Laterality: Left;  Left eye Diabetic   CATARACT EXTRACTION W/PHACO Right 03/13/2016   Procedure: CATARACT EXTRACTION PHACO AND INTRAOCULAR LENS PLACEMENT (IOC)  right  diabetic;  Surgeon: Dene Etienne, MD;  Location: [facility];  Service: Ophthalmology;  Laterality: Right;  diabetic   CHOLECYSTECTOMY  1978   COLONOSCOPY N/A 11/05/2023   Procedure: COLONOSCOPY;  Surgeon: Unk Corinn Skiff, MD;  Location: [facility] ENDOSCOPY;  Service: Gastroenterology;  Laterality: N/A;   COLONOSCOPY W/ POLYPECTOMY  09/09/2013   8 mm tubular adenoma of the cecum.  Lamar Aho, MD. [facility]   COLONOSCOPY WITH PROPOFOL  N/A 06/04/2019   Procedure: COLONOSCOPY WITH PROPOFOL ;  Surgeon: Jinny Carmine, MD;  Location: [facility] ENDOSCOPY;  Service: Endoscopy;  Laterality: N/A;   ESOPHAGOGASTRODUODENOSCOPY N/A 11/05/2023   Procedure: EGD (ESOPHAGOGASTRODUODENOSCOPY);  Surgeon: Unk Corinn Skiff, MD;  Location: [facility] ENDOSCOPY;  Service: Gastroenterology;  Laterality: N/A;   ESOPHAGOGASTRODUODENOSCOPY (EGD) WITH PROPOFOL  N/A 06/04/2019   Procedure: ESOPHAGOGASTRODUODENOSCOPY (EGD) WITH PROPOFOL ;  Surgeon: Jinny Carmine, MD;  Location: [facility] ENDOSCOPY;  Service: Endoscopy;  Laterality: N/A;   INDOCYANINE GREEN  FLUORESCENCE IMAGING (ICG)  02/20/2024   Procedure: INDOCYANINE GREEN  FLUORESCENCE IMAGING (ICG);  Surgeon: Marinda Jayson KIDD, MD;  Location: [facility] OR[facility];  Service: General;;   JOINT REPLACEMENT Left    knee    LAPAROTOMY N/A 02/19/2024   Procedure: LAPAROTOMY, EXPLORATORY;  Surgeon: Marinda Jayson KIDD, MD;  Location: [facility];  Service: General;  Laterality: N/A;  Internal hernia reduciton   LAPAROTOMY N/A 02/20/2024   Procedure: LAPAROTOMY, EXPLORATORY;  Surgeon:  Marinda Jayson KIDD, MD;  Location: [facility];  Service: General;  Laterality: N/A;  WOUND CLOSURE   LEFT HEART CATH AND CORONARY ANGIOGRAPHY N/A 11/06/2017   Procedure: LEFT HEART CATH AND CORONARY ANGIOGRAPHY;  Surgeon: Mady Bruckner, MD;  Location: [facility];  Service: Cardiovascular;  Laterality: N/A;   LYSIS OF ADHESION  02/19/2024   Procedure: LAPAROTOMY, FOR LYSIS OF ADHESIONS;  Surgeon: Marinda Jayson KIDD, MD;  Location: [facility];  Service: General;;   MASTECTOMY Left 06/13/2017   mastectomy neg margins   OOPHORECTOMY     one still left   RENAL ANGIOGRAPHY  11/06/2017   Procedure: RENAL ANGIOGRAPHY;  Surgeon: Mady Bruckner, MD;  Location: [facility];  Service: Cardiovascular;;   SENTINEL NODE BIOPSY Left 06/13/2017   Procedure: SENTINEL NODE BIOPSY;  Surgeon: Dessa Reyes ORN, MD;  Location: [facility];  Service: General;  Laterality: Left;   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  Surgeon: Dessa Reyes ORN, MD;  Location: [facility] ORS;  Service: General;  Laterality: Left;     Social History:   reports that she has never smoked. She has never used smokeless tobacco. She reports that she does not drink alcohol and does not use drugs.   Family History:  Her family history includes Breast cancer in an other family member; Breast cancer (age of onset: 33) in her sister; Coronary artery disease (age of onset: 53) in her father; Diabetes in an other family member; Esophageal cancer in her brother; Heart attack (age of onset: 48) in her mother; Heart disease in an other family member; Hypertension in her father and mother; Stomach cancer in an other family member; Stroke in her father.   Allergies Allergies[1]   Home Medications  Prior to Admission medications  Medication Sig Start Date End Date Taking? Authorizing Provider  amLODipine  (NORVASC ) 5 MG tablet Take 1 tablet (5 mg total) by mouth  daily. 01/28/24  Yes Bedsole, Amy E, MD  atorvastatin  (LIPITOR) 40 MG tablet Take 1 tablet (40 mg total) by mouth at bedtime. 01/23/24  Yes Bedsole, Amy E, MD  brimonidine  (ALPHAGAN ) 0.2 % ophthalmic solution brimonidine  0.2 % eye drops  INSTILL 1 DROP INTO BOTH EYES TWICE A DAY   Yes [provider]  busPIRone  (BUSPAR ) 5 MG tablet Take 1 tablet (5 mg total) by mouth 2 (two) times daily. 01/23/24  Yes Bedsole, Amy E, MD  Calcium  Carb-Cholecalciferol  (CALCIUM  600 + D) 600-5 MG-MCG TABS Take 2 tablets by mouth daily. 09/12/21  Yes Babara Call, MD  diclofenac  (VOLTAREN ) 75 MG EC tablet Take 1 tablet (75 mg total) by mouth 2 (two) times daily. 01/23/24  Yes Bedsole, Amy E, MD  diclofenac  Sodium (VOLTAREN ) 1 % GEL APPLY 2 GRAMS TOPICALLY TO THE AFFECTED AREA 4 TIMES DAILY. 06/25/21  Yes Bedsole, Amy E, MD  esomeprazole  (NEXIUM ) 40 MG capsule TAKE 1 CAPSULE TWICE DAILY BEFORE MEALS 01/23/24  Yes Bedsole, Amy E, MD  fluticasone  (FLONASE ) 50 MCG/ACT nasal spray USE 2 SPRAYS INTO THE NOSE DAILY AS DIRECTED (SUBSTITUTED FOR FLONASE ) 01/23/24  Yes Bedsole, Amy E, MD  furosemide  (LASIX ) 20 MG tablet TAKE 1 TABLET BY MOUTH EVERY DAY AS NEEDED 01/15/18  Yes Bedsole, Amy E, MD  gabapentin  (NEURONTIN ) 100 MG capsule TAKE 1 CAPSULE  EVERY MORNING , 1 CAPSULE AT LUNCH, 1 CAPSULE AT DINNER, AND 2-3 CAPSULES AT BEDTIME 01/28/24  Yes Bedsole, Amy E, MD  Glucosamine 500 MG TABS Take 500 mg by mouth daily.   Yes [provider]  isosorbide  mononitrate (IMDUR ) 30 MG 24 hr tablet Take 1 tablet (30 mg total) by mouth daily. 01/23/24  Yes Bedsole, Amy E, MD  latanoprost  (XALATAN ) 0.005 % ophthalmic solution Place 1 drop into both eyes at bedtime.   Yes [provider]  lidocaine  (LIDODERM ) 5 % APPLY 1 PATCH ONTO THE SKIN EVERY DAY. REMOVE AND DISCARD PATCH WITHIN 12 HOURS OR AS DIRECTED BY PHYSICIAN. 01/23/24  Yes Bedsole, Amy E, MD  mirabegron  ER (MYRBETRIQ ) 25 MG TB24 tablet Take 1 tablet (25 mg total) by mouth daily.  01/23/24  Yes Bedsole, Amy E, MD  Omega-3 Fatty Acids (FISH OIL) 1000 MG CAPS Take 1,000 mg by mouth daily.    Yes [provider]  polycarbophil (FIBERCON) 625 MG tablet Take 625 mg by mouth daily.   Yes [provider]  potassium chloride  SA (KLOR-CON  M) 20 MEQ tablet Take 1 tablet (20 mEq total) by mouth 2 (two) times daily. 01/28/24  Yes Bedsole, Amy E, MD  traMADol  (ULTRAM ) 50 MG tablet Take 1 tablet (50 mg total) by mouth every 6 (six) hours as needed. for pain 01/28/24  Yes Bedsole, Amy E, MD  Alcohol Swabs (DROPSAFE ALCOHOL PREP) 70 % PADS USE  TO TEST BLOOD SUGAR ONE TIME DAILY 06/25/21   Avelina, Amy E, MD  Blood Glucose Calibration (ACCU-CHEK AVIVA) SOLN Use to check blood sugar once daily 10/27/18   Bedsole, Amy E, MD  Blood Glucose Calibration (TRUE METRIX LEVEL 1) Low SOLN USE AS DIRECTED 06/12/20   Bedsole, Amy E, MD  Blood Glucose Monitoring Suppl (TRUE METRIX AIR GLUCOSE METER) w/Device KIT Use to check blood sugar daily 04/30/21   Bedsole, Amy E, MD  glucose blood (TRUE METRIX BLOOD GLUCOSE TEST) test strip Use to check blood sugar daily 04/30/21   Bedsole, Amy E, MD  Iron -Vitamin C  65-125 MG TABS Take 1 tablet by mouth daily at 2 PM. Patient not taking: Reported on 02/18/2024 03/15/22   Babara Call, MD  losartan -hydrochlorothiazide  (HYZAAR) 100-25 MG tablet Take 1 tablet by mouth daily. Patient not taking: Reported on 02/18/2024    [provider]  triamcinolone  cream (KENALOG ) 0.5 % Apply 1 application topically 2 (two) times daily. Patient not taking: Reported on 02/18/2024 08/12/19   Avelina Greig BRAVO, MD  TRUEplus Lancets 28G MISC TEST BLOOD SUGAR EVERY DAY 06/28/21   Avelina Greig BRAVO, MD     Critical Care time: 40 minutes     Lonell Moose, AGNP  Pulmonary/Critical Care Pager [phone] (please enter 7 digits) PCCM Consult Pager [phone] (please enter 7 digits)           [1]  Allergies Allergen Reactions   Oxycodone  Nausea Only   Sulfa Antibiotics  Rash    Childhood reaction   Sulfonamide Derivatives Rash    Childhood reaction.   "

## 2024-02-25 NOTE — Plan of Care (Signed)
  Problem: Education: Goal: Ability to describe self-care measures that may prevent or decrease complications (Diabetes Survival Skills Education) will improve Outcome: Not Progressing Goal: Individualized Educational Video(s) Outcome: Not Progressing   Problem: Coping: Goal: Ability to adjust to condition or change in health will improve Outcome: Not Progressing   Problem: Fluid Volume: Goal: Ability to maintain a balanced intake and output will improve Outcome: Progressing

## 2024-02-26 ENCOUNTER — Inpatient Hospital Stay

## 2024-02-26 DIAGNOSIS — A419 Sepsis, unspecified organism: Secondary | ICD-10-CM | POA: Diagnosis not present

## 2024-02-26 DIAGNOSIS — R7989 Other specified abnormal findings of blood chemistry: Secondary | ICD-10-CM | POA: Diagnosis not present

## 2024-02-26 DIAGNOSIS — E870 Hyperosmolality and hypernatremia: Secondary | ICD-10-CM

## 2024-02-26 DIAGNOSIS — I5032 Chronic diastolic (congestive) heart failure: Secondary | ICD-10-CM | POA: Diagnosis not present

## 2024-02-26 DIAGNOSIS — E876 Hypokalemia: Secondary | ICD-10-CM | POA: Diagnosis not present

## 2024-02-26 DIAGNOSIS — G9341 Metabolic encephalopathy: Secondary | ICD-10-CM | POA: Diagnosis not present

## 2024-02-26 DIAGNOSIS — E119 Type 2 diabetes mellitus without complications: Secondary | ICD-10-CM | POA: Diagnosis not present

## 2024-02-26 DIAGNOSIS — K56609 Unspecified intestinal obstruction, unspecified as to partial versus complete obstruction: Secondary | ICD-10-CM | POA: Diagnosis not present

## 2024-02-26 LAB — GASTROINTESTINAL PANEL BY PCR, STOOL (REPLACES STOOL CULTURE)

## 2024-02-26 LAB — CBC
HCT: 31.2 % — ABNORMAL LOW (ref 36.0–46.0)
Hemoglobin: 10 g/dL — ABNORMAL LOW (ref 12.0–15.0)
MCH: 26.4 pg (ref 26.0–34.0)
MCHC: 32.1 g/dL (ref 30.0–36.0)
MCV: 82.3 fL (ref 80.0–100.0)
Platelets: 117 10*3/uL — ABNORMAL LOW (ref 150–400)
RBC: 3.79 MIL/uL — ABNORMAL LOW (ref 3.87–5.11)
RDW: 19.2 % — ABNORMAL HIGH (ref 11.5–15.5)
WBC: 17.8 10*3/uL — ABNORMAL HIGH (ref 4.0–10.5)
nRBC: 0 % (ref 0.0–0.2)

## 2024-02-26 LAB — BASIC METABOLIC PANEL WITH GFR
Anion gap: 12 (ref 5–15)
BUN: 63 mg/dL — ABNORMAL HIGH (ref 8–23)
CO2: 25 mmol/L (ref 22–32)
Calcium: 8.3 mg/dL — ABNORMAL LOW (ref 8.9–10.3)
Chloride: 113 mmol/L — ABNORMAL HIGH (ref 98–111)
Creatinine, Ser: 0.91 mg/dL (ref 0.44–1.00)
GFR, Estimated: >60 mL/min
Glucose, Bld: 134 mg/dL — ABNORMAL HIGH (ref 70–99)
Potassium: 3.4 mmol/L — ABNORMAL LOW (ref 3.5–5.1)
Sodium: 150 mmol/L — ABNORMAL HIGH (ref 135–145)

## 2024-02-26 LAB — GLUCOSE, CAPILLARY
Glucose-Capillary: 125 mg/dL — ABNORMAL HIGH (ref 70–99)
Glucose-Capillary: 135 mg/dL — ABNORMAL HIGH (ref 70–99)
Glucose-Capillary: 115 mg/dL — ABNORMAL HIGH (ref 70–99)
Glucose-Capillary: 142 mg/dL — ABNORMAL HIGH (ref 70–99)
Glucose-Capillary: 150 mg/dL — ABNORMAL HIGH (ref 70–99)

## 2024-02-26 LAB — PHOSPHORUS: Phosphorus: 4.1 mg/dL (ref 2.5–4.6)

## 2024-02-26 LAB — COMPREHENSIVE METABOLIC PANEL WITH GFR
ALT: 121 U/L — ABNORMAL HIGH (ref 0–44)
AST: 61 U/L — ABNORMAL HIGH (ref 15–41)
Albumin: 2.4 g/dL — ABNORMAL LOW (ref 3.5–5.0)
Alkaline Phosphatase: 171 U/L — ABNORMAL HIGH (ref 38–126)
Anion gap: 15 (ref 5–15)
BUN: 63 mg/dL — ABNORMAL HIGH (ref 8–23)
CO2: 26 mmol/L (ref 22–32)
Calcium: 8.3 mg/dL — ABNORMAL LOW (ref 8.9–10.3)
Chloride: 113 mmol/L — ABNORMAL HIGH (ref 98–111)
Creatinine, Ser: 0.95 mg/dL (ref 0.44–1.00)
GFR, Estimated: >60 mL/min
Glucose, Bld: 136 mg/dL — ABNORMAL HIGH (ref 70–99)
Potassium: 3.2 mmol/L — ABNORMAL LOW (ref 3.5–5.1)
Sodium: 153 mmol/L — ABNORMAL HIGH (ref 135–145)
Total Bilirubin: 1.4 mg/dL — ABNORMAL HIGH (ref 0.0–1.2)
Total Protein: 5 g/dL — ABNORMAL LOW (ref 6.5–8.1)

## 2024-02-26 LAB — MAGNESIUM: Magnesium: 2.4 mg/dL (ref 1.7–2.4)

## 2024-02-26 LAB — THYROID PANEL WITH TSH
Free Thyroxine Index: 1.1 — ABNORMAL LOW (ref 1.2–4.9)
T3 Uptake Ratio: 27 % (ref 24–39)
T4, Total: 3.9 ug/dL — ABNORMAL LOW (ref 4.5–12.0)
TSH: 10.7 u[IU]/mL — ABNORMAL HIGH (ref 0.450–4.500)

## 2024-02-26 MED ORDER — POTASSIUM CHLORIDE 10 MEQ/50ML IV SOLN
10.0000 meq | INTRAVENOUS | Status: AC
  Administered 2024-02-26 (×4): 10 meq via INTRAVENOUS
  Filled 2024-02-26 (×4): qty 50

## 2024-02-26 MED ORDER — ACETAMINOPHEN 10 MG/ML IV SOLN
1000.0000 mg | Freq: Four times a day (QID) | INTRAVENOUS | Status: AC | PRN
  Administered 2024-02-26 – 2024-02-27 (×3): 1000 mg via INTRAVENOUS
  Filled 2024-02-26 (×3): qty 100

## 2024-02-26 MED ORDER — ACETAMINOPHEN 10 MG/ML IV SOLN
1000.0000 mg | Freq: Once | INTRAVENOUS | Status: AC
  Administered 2024-02-26: 1000 mg via INTRAVENOUS
  Filled 2024-02-26: qty 100

## 2024-02-26 MED ORDER — VITAL HP 1.0 CAL PO LIQD: 1000.0000 mL | ORAL | Status: DC

## 2024-02-26 MED ORDER — CHLORTHALIDONE 25 MG PO TABS
25.0000 mg | ORAL_TABLET | Freq: Once | ORAL | Status: AC
  Administered 2024-02-26: 25 mg
  Filled 2024-02-26: qty 1

## 2024-02-26 MED ORDER — VITAL 1.5 CAL PO LIQD
1000.0000 mL | ORAL | Status: DC
  Administered 2024-02-26: 1000 mL

## 2024-02-26 MED ORDER — INSULIN ASPART 100 UNIT/ML IJ SOLN
0.0000 [IU] | INTRAMUSCULAR | Status: AC
  Administered 2024-02-26 – 2024-02-27 (×6): 1 [IU] via SUBCUTANEOUS
  Filled 2024-02-26 (×6): qty 1

## 2024-02-26 MED ORDER — CHLORTHALIDONE 25 MG PO TABS
25.0000 mg | ORAL_TABLET | Freq: Every day | ORAL | Status: DC
  Filled 2024-02-26: qty 1

## 2024-02-26 MED ORDER — TRACE MINERALS CU-MN-SE-ZN 300-55-60-3000 MCG/ML IV SOLN
INTRAVENOUS | Status: AC
  Filled 2024-02-26: qty 537.6

## 2024-02-26 NOTE — Progress Notes (Signed)
 Nutrition Follow-up  DOCUMENTATION CODES:   Not applicable  INTERVENTION:   -TPN management per pharmacy  -Trickle feeds via NGT per surgery:   Initiate Vital 1.5 @ 20 ml/hr   Once able increase by 10 ml every 12 hours to goal rate of 45 ml/hr.   30 ml free water flush every 4 hours  Tube feeding regimen provides 1620 kcal (100% of needs), 73 grams of protein, and 825 ml of H2O. Total free water: 1005  -Once able to advance TF, add 1 packet Juven BID via tube, each packet provides 95 calories, 2.5 grams of protein (collagen), and 9.8 grams of carbohydrate (3 grams sugar); also contains 7 grams of L-arginine and L-glutamine, 300 mg vitamin C , 15 mg vitamin E, 1.2 mcg vitamin B-12, 9.5 mg zinc, 200 mg calcium , and 1.5 g  Calcium  Beta-hydroxy-Beta-methylbutyrate to support wound healing   NUTRITION DIAGNOSIS:   Inadequate oral intake related to inability to eat (pt sedated and ventilated) as evidenced by NPO status.  Ongoing  GOAL:   Provide needs based on ASPEN/SCCM guidelines  Met with TPN  MONITOR:   Vent status, Labs, Weight trends, Skin, I & O's  REASON FOR ASSESSMENT:   Consult Enteral/tube feeding initiation and management  ASSESSMENT:   79 y/o female with h/o IDA, B12 deficiency, Cameron erosions, DM, large hiatal hernia, MDD, HTN, CHF, CAD, HLD, diverticulosis, PUD, breast cancer s/p mastectomy, GERD, pulmonary hypertension and chronic constipation who is admitted with AKI and septic shock secondary to peritonitis, SBO with bowel necrosis and internal hernia now s/p exploratory laparotomy, lysis of adhesions, reduction of internal hernia, small bowel resection (~18-20cm mid bowel) and placement of ABThera negative pressure wound VAC 1/29.  1/28- NGT placed, KUB confirmed placement- Nasogastric tube terminates within a large hiatal hernia in the right lower Chest 1/29- Procedure: Exploratory laparotomy, lysis of adhesions for 30 minutes, reduction of internal  hernia, small bowel resection, placement of ABThera negative pressure wound VAC measuring greater than 50 sq cm  2/3- weaned off pressors, CT revealed free fluid in abdomen but no drainable collection  2/4- MRI negative for acute pathology  Patient is currently intubated on ventilator support MV: 12.3 L/min Temp (24hrs), Avg:99.7 F (37.6 C), Min:98.1 F (36.7 C), Max:101.1 F (38.4 C)  Reviewed I/O's: +35 ml x 24 hours and +11.7 L since admission  UOP: 1.7 L x 24 hours   NGT output: 900 ml x 24 hours  Per general surgery notes, plan to initiate trickle feeds today.   Patient receiving TPN for sole source nutrition. TPN infusing at 60 ml/hr, which provides 1600 kcals and 81 grams protein, meeting 100% of estimated kcal and protein needs.   Reviewed weights; weights have ranged form 58.1-64.1 kg over the past 7 days. Noted patient with moderate edema per nursing assessment. Noted patient +11.7 L since admission  Palliative care following for goals of care; option of PEG trach versus one way extubation. Patient daughter plan to speak with family members over the next few days prior to coming to firm decision.   Medications reviewed and include protonix .   Labs reviewed: Na: 153, K: 3.2, Phos and Mg WDL, CBGS: 125-141 (inpatient orders for glycemic control are 0-9 units insulin  aspart every 6 hours).  Electrolytes have been adjusted in TPN by pharmacy  Diet Order:   Diet Order             Diet NPO time specified  Diet effective now  EDUCATION NEEDS:   Not appropriate for education at this time  Skin:  Skin Assessment: Skin Integrity Issues: Skin Integrity Issues:: Wound VAC, Incisions Wound Vac: abdomen Incisions: clsoed abdomen  Last BM:  02/23/24 (type 7)  Height:   Ht Readings from Last 1 Encounters:  02/19/24 4' 11 (1.499 m)    Weight:   Wt Readings from Last 1 Encounters:  02/26/24 61.8 kg    Ideal Body Weight:  45 kg  BMI:  Body  mass index is 27.52 kg/m.  Estimated Nutritional Needs:   Kcal:  1400-1600kcal/day  Protein:  70-80g/day  Fluid:  1.3-1.5L/day    Ashley Ryan, RD, LDN, CDCES Registered Dietitian III Certified Diabetes Care and Education Specialist If unable to reach this RD, please use RD Inpatient group chat on secure chat between hours of 8am-4 pm daily

## 2024-02-26 NOTE — Progress Notes (Signed)
 1547 PM: GI panel results are negative.

## 2024-02-26 NOTE — Plan of Care (Signed)
" °  Problem: Nutritional: Goal: Maintenance of adequate nutrition will improve Outcome: Progressing   Problem: Skin Integrity: Goal: Risk for impaired skin integrity will decrease Outcome: Progressing   Problem: Clinical Measurements: Goal: Ability to maintain clinical measurements within normal limits will improve Outcome: Progressing Goal: Will remain free from infection Outcome: Progressing Goal: Diagnostic test results will improve Outcome: Progressing Goal: Cardiovascular complication will be avoided Outcome: Progressing   Problem: Safety: Goal: Ability to remain free from injury will improve Outcome: Progressing   "

## 2024-02-26 NOTE — Evaluation (Signed)
 Physical Therapy Evaluation Patient Details Name: Ashley Ryan MRN: [id number] DOB: 1945/09/14 Today's Date: 02/26/2024  History of Present Illness  Pt is an 79 yo female that presented to ED for SBO, S/p emergent ex-lap with reduction of internal hernia, SBR, abthere placement on 1/29, back to or ON 1/30 for further resection, anastamosis and closure; Pt is intubated, not sedated.   Clinical Impression  PT cleared by MD to initiate PT evaluation. Pt required totalA for rolling, unable to move BLE on command. Did intermittently track in room, open/closed eyes once to command. Cervical PROM checked, some manual therapy to R trap, with some grimacing from pt noted. PT to continue to assess pts ability to participate with therapy services and maximize participation.   System Stimulus Response  Auditory  Calling name   Tracking to the R and Left     Command following   Inconsistent, does attempt to nod yes  when asked if she wants to be repositioned. Closes/opens her eyes on command. Tracks throughout room on command.   Vestibular Elevating HOB   Tolerates    Rolling in bed   Tolerates, eyes open/awake throughout    Proprioceptive Passive ROM   Tolerates, NT on RUE; Preference to the R towards the vent, tolerates to midline       AROM  Attempts, but ultimately unable to peform any attempts at mobility             If plan is discharge home, recommend the following: Two people to help with walking and/or transfers;Two people to help with bathing/dressing/bathroom;Direct supervision/assist for medications management;Help with stairs or ramp for entrance;Assist for transportation;Assistance with cooking/housework;Assistance with feeding;Supervision due to cognitive status   Can travel by private vehicle        Equipment Recommendations Other (comment) (TBD)  Recommendations for Other Services       Functional Status Assessment Patient has had a recent decline in their functional  status and/or demonstrates limited ability to make significant improvements in function in a reasonable and predictable amount of time     Precautions / Restrictions Precautions Precautions: Fall Recall of Precautions/Restrictions: Impaired Precaution/Restrictions Comments: vent Restrictions Weight Bearing Restrictions Per Provider Order: No      Mobility  Bed Mobility Overal bed mobility: Needs Assistance Bed Mobility: Rolling Rolling: Total assist, +2 for physical assistance, +2 for safety/equipment         General bed mobility comments: to the R with wedge under L hip    Transfers                        Ambulation/Gait                  Stairs            Wheelchair Mobility     Tilt Bed    Modified Rankin (Stroke Patients Only)       Balance                                             Pertinent Vitals/Pain Pain Assessment Pain Assessment: CPOT Facial Expression: Relaxed, neutral Body Movements: Absence of movements Muscle Tension: Relaxed Compliance with ventilator (intubated pts.): Tolerating ventilator or movement Vocalization (extubated pts.): N/A CPOT Total: 0 Pain Intervention(s): Monitored during session, Repositioned    Home Living Family/patient expects to be discharged  to:: Private residence Living Arrangements: Spouse/significant other   Type of Home: House                  Prior Function Prior Level of Function : Independent/Modified Independent                     Extremity/Trunk Assessment   Upper Extremity Assessment Upper Extremity Assessment: Defer to OT evaluation RUE Deficits / Details: NT on this date LUE Deficits / Details: tolerates PROM to LUE; grossly [facility]    Lower Extremity Assessment Lower Extremity Assessment: Generalized weakness (tolerates PROM to BLE; grossly WFL)       Communication   Communication Communication: Impaired Factors Affecting  Communication: Trach/intubated    Cognition Arousal: Obtunded Behavior During Therapy: Flat affect                             Following commands: Impaired Following commands impaired: Follows one step commands inconsistently     Cueing Cueing Techniques: Verbal cues, Gestural cues, Tactile cues, Visual cues     General Comments      Exercises     Assessment/Plan    PT Assessment Patient needs continued PT services  PT Problem List Decreased strength;Decreased range of motion;Decreased cognition;Decreased knowledge of use of DME;Decreased activity tolerance;Decreased balance;Decreased safety awareness;Decreased mobility;Decreased knowledge of precautions       PT Treatment Interventions DME instruction;Balance training;Gait training;Neuromuscular re-education;Stair training;Functional mobility training;Patient/family education;Therapeutic activities;Wheelchair mobility training;Therapeutic exercise    PT Goals (Current goals can be found in the Care Plan section)  Acute Rehab PT Goals PT Goal Formulation: Patient unable to participate in goal setting Time For Goal Achievement: 03/11/24 Potential to Achieve Goals: Poor    Frequency Min 2X/week     Co-evaluation PT/OT/SLP Co-Evaluation/Treatment: Yes Reason for Co-Treatment: Complexity of the patient's impairments (multi-system involvement);Necessary to address cognition/behavior during functional activity;For patient/therapist safety PT goals addressed during session: Mobility/safety with mobility OT goals addressed during session: ADL's and self-care       AM-PAC PT 6 Clicks Mobility  Outcome Measure Help needed turning from your back to your side while in a flat bed without using bedrails?: Total Help needed moving from lying on your back to sitting on the side of a flat bed without using bedrails?: Total Help needed moving to and from a bed to a chair (including a wheelchair)?: Total Help needed  standing up from a chair using your arms (e.g., wheelchair or bedside chair)?: Total Help needed to walk in hospital room?: Total Help needed climbing 3-5 steps with a railing? : Total 6 Click Score: 6    End of Session   Activity Tolerance: Patient limited by lethargy Patient left: in bed;with call bell/phone within reach;with bed alarm set Nurse Communication: Mobility status PT Visit Diagnosis: Other abnormalities of gait and mobility (R26.89);Difficulty in walking, not elsewhere classified (R26.2);Muscle weakness (generalized) (M62.81)    Time: 8561-8544 PT Time Calculation (min) (ACUTE ONLY): 17 min   Charges:   PT Evaluation $PT Eval High Complexity: 1 High   PT General Charges $$ ACUTE PT VISIT: 1 Visit        Doyal Shams PT, DPT 4:18 PM,02/26/24

## 2024-02-26 NOTE — Progress Notes (Signed)
 PHARMACY - TOTAL PARENTERAL NUTRITION CONSULT NOTE   Indication: massive bowel resection   Patient Measurements: Height: 4' 11 (149.9 cm) Weight: 61.8 kg (136 lb 3.9 oz) IBW/kg (Calculated) : 43.2 TPN AdjBW (KG): 46.9 Body mass index is 27.52 kg/m.  Assessment:  79 y/o female with h/o IDA, B12 deficiency, Cameron erosions, DM, large hiatal hernia, MDD, HTN, CHF, CAD, HLD, diverticulosis, PUD, breast cancer s/p mastectomy, GERD, pulmonary hypertension and chronic constipation who is admitted with AKI and septic shock secondary to peritonitis, SBO with bowel necrosis and internal hernia now s/p exploratory laparotomy, lysis of adhesions, reduction of internal hernia, small bowel resection (~18-20cm mid bowel) and placement of ABThera negative pressure wound VAC 1/29.   Glucose / Insulin :  --Hx controlled DM, last A1c 5.6% --CBGs at goal Electrolytes: Hypernatremia and hypokalemia. Likely secondary to fluid losses from NGT, diarrhea and diuresis which has been discontinued Renal: SCr stable around 1 Hepatic: Transaminitis resolving. Tbili stable Intake / Output; MIVF: N/A GI Imaging: No new pertinent imaging studies  GI Surgeries / Procedures: s/p exploratory laparotomy, lysis of adhesions, reduction of internal hernia, small bowel resection, placement of ABThera negative pressure wound VAC 1/29  Central access: 02/21/24 TPN start date: 02/21/24  Nutritional Goals: Goal TPN rate is 60 mL/hr (provides 80.6 g of protein and 1601 kcals per day)  RD Assessment: Estimated Needs Total Energy Estimated Needs: 1400-1600kcal/day Total Protein Estimated Needs: 70-80g/day Total Fluid Estimated Needs: 1.3-1.5L/day  Current Nutrition:  NPO Plan to start trickle feeds 2/5  Plan:  --Continue TPN to 60 mL/hr at 1800 --Electrolytes in TPN (standard): Na 50mEq/L, K 46mEq/L, Ca 55mEq/L, Mg 29mEq/L, and Phos 15mmol/L. Cl:Ac 1:1 --Add standard MVI and trace elements to TPN --Continue sSSI for now  and adjust as needed  --Monitor TPN labs on Mon/Thurs, daily until stable  Ashley Ryan 02/26/2024,7:53 AM

## 2024-02-26 NOTE — Progress Notes (Signed)
 "                                                                                                                                                                                               Palliative Care Progress Note, Assessment & Plan   Patient Name: Ashley Ryan       Date: 02/26/2024 DOB: 1945-04-05  Age: 79 y.o. MRN#: [id number] Attending Physician: Malka Domino, MD Primary Care Physician: Avelina Greig BRAVO, MD Admit Date: 02/18/2024  Subjective: Patient is lying in bed with ventilatory support and NG tube in place.  Her eyes are open but she does not meet my gaze or track me.  Her daughter Adela is at bedside during my visit.  HPI: 79 y.o. 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   Found to have small bowel obstruction--general surgery consulted, conservative management initially with NGT placement and IVF resuscitation, AKI and hypotension but then worsening lactic acidosis, CT with increased edema, and nectoric bowel noted. Pt then taken on 1/29 for emergent laparotomy with lysis of adhesions, reduction of internal hernia, small bowel resection, and placement of wound VAC.  Significant events and details of hospitalization are as follows as per CCM note: 1/28: Admitted by [facility] for SBO and AKI.  General Surgery consulted.  BP soft, PCCM consulted for potential vasopressors.  1/29: This morning with worsening lactic acidosis and hypotension requiring initiation of Levophed , CT with increase in edema and fluid along mesentery concerning for Peritonitis and necrotic small bowel.  Taken for emergent laparotomy with lysis of adhesions, reduction of internal hernia, small bowel resection, and placement of wound VAC  Remains intubated post procedure, tentative plan to return to OR tomorrow.  1/30: No significant events  overnight.   Remains intubated and sedated, NE downtrending, went to OR for Relook. 1/31:  Remains intubated, sedation off for WUA.  Opens eyes to voice but not following commands, NE uptrending, given fluids and added Vasopressin . 2/1: On minimal vent support, more awake, SBT for a while but then became labored.  Anemic this am requiring 1 unit PRBC. But no clear signs of bleeding.  Pressors requirements downtrending.   2/2: No significant events overnight.  Off sedation for WUA, very weak but awake and tracks.  SBT as tolerated, diurese with 40 mg IV Lasix  x1 dose.  Per Surgery may trial trickle feeds.  Off vasopressors, d/c stress dose steroids.  2/3: No acute events overnight.  Hemodynamically stable, no vasopressors.  On minimal vent support, WUA/SBT as tolerated.  Remains encephalopathic and not following commands, obtain MRI Brain.  With worsening Leukocytosis, repeat CT Abdomen/Pelvis with concern for adynamic ileus, small volume pelvic fluid but NO drainable abscess, and increased bilateral pleural effusions and progressive bibasilar airspace disease concerning for atelectasis vs pneumonia.  Will obtain PCT, tracheal aspirate, and add Vancomycin  due to previously + MRSA PCR. 2/4: MRI Brain negative pt remains mechanically intubated on minimal settings.  She is able to track but unable to follow commands precluding extubation.  Pt now having diarrhea rectal pouch place GI panel ordered  2/5: Pt remains mechanically intubated on minimal vent settings.  She is still unable to follow commands which is precluding extubation.  Palliative Care team to discuss goals of treatment with pts family. If they want to continue with aggressive treatment pt will likely require tracheostomy placement.  Pt febrile administered 1,000 mg iv tylenol  x1 dose.  Woundvac removed per General Surgery    Following up today to discuss patient's wishes in regards to trach/PEG placement.  Summary of counseling/coordination of  care: Chart review completed prior to meeting patient including:  -Labs: WBC remains elevated at 17.8, sodium 153, albumin  2.4 -Vital signs: Tachycardic, mild hypotension -Progress notes: Reviewed surgery's note from today, reviewed CCM note from today -Imaging: -Orders: -Available advanced directive documents from current and previous encounters:  After reviewing the patient's chart and assessing the patient at bedside, I dissipated ICU rounds of counseled with CCM in regards to next steps for patient.  Given patient's poor mental status despite being off of sedation for an extended amount of time with MRI WNL, it is clear that patient cannot be safely extubated.  Therefore, options are to proceed with trach/PEG in the near future or to proceed with one-way comfort extubation.  After meeting with ICU team, I spoke with patient's daughter at bedside.  Discussed fluctuating WBC, MRI revealing no acute abnormalities, and patient's neurological status.  Discussed her mental faculties are driving a lot of her recovery.  However, she is not showing signs of improvement.  Daughter shares understanding and is in agreement.  She shares her mother seems to be the same over the last several days without signs of waking up or improving.  With daughter's permission, we discussed  best and worst-case outcomes if patient were to have a trach/PEG placed as well as outcomes for a one-way comfort extubation.  I attempted to elicit values and goals that the patient shared with her family prior to this hospitalization.  Patient's daughter is clear that she has had several discussions over the last 6+ months with her mother in regards to her mother's wishes.  Daughter conveys that patient is very active and if she were ever to reach the point where she could not be up and going and then she would not want to live like this.    Discussed that if patient were to receive a trach/PEG then it would be allowing more  time for outcomes.  Patient may show signs of mental improvement however she could be bed ridden with need of 24-hour nursing care for an indefinite amount of time in the same state that she currently is in.  Only time will tell.  Discussion of quality of life versus quantity of life reviewed.  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 Discussed human mortality, failure to thrive, and patient's advanced age.  Daughter shares she has felt sometimes that this type of conversation would occur.  She shares her mother seem Yogi Arther already made her decision.  In addition to her mother making her wishes known previously, daughter also shares that patient's body is making this decision.  Daughter shares she wishes to speak with her brother and her stepfather/patient's husband.  However, she shares concern that patient's husband is not in the state of mind to understand what is going on because he himself is very ill and of advanced age.  She shares she wishes to uphold her mother's wishes and to standby and support what she knows her mother would want for herself.  These wishes include never accepting tracheostomy/PEG tube.    Additionally, daughter shares that patient would want to be a DNR.  In the event of a cardiopulmonary arrest, daughter firmly believes patient would never want to be accepting of CPR/ACLS protocol in an attempt to sustain her life as patient has shared these wishes with daughter in the past.  Therefore, she is in agreement to change CODE STATUS to DNR/limited.  She wishes for time to speak more with her family.  However, she has a clear understanding that intubation should not be continued beyond 13-14 days.  We briefly discussed one-way extubation and comfort measures.   However, she shares she is having PTSD from having lost her husband less than 7 months ago to muscular dystrophy.  She is here she does not want to go down that rabbit hole the day.  Therefore, specific send description of comfort measures held for another date/time.  She shares she hopes to have the next few days to speak with family and have them come visit prior to making a final decision in regards to when one-way extubation per.  However, she shares she is 90% sure at this time that will be the plan.  No change to plan of care at this time living CODE STATUS.  She remains in agreement to continue with current plan of care.  We discussed pending results of ultrasound given patient's right arm edema.  I offered to speak with patient's husband.  She shares that she is not sure he has an understanding of what is happening and that she plans to speak with him directly today.  PMT contact info given and advised that is a family meeting needs to occur if anybody has questions/concerns in regards to next steps for patient that PMT and medical team remain available.   After visiting with patient and daughter, I conveyed above to RN Sula, CCM NP Maranda and attending Dr. Malka.    Physical Exam Vitals reviewed.  Constitutional:      General: She is not in acute distress. HENT:     Head: Normocephalic.  Cardiovascular:     Rate and Rhythm: Tachycardia present.     Pulses: Normal pulses.  Pulmonary:     Comments: Ventilatory support Musculoskeletal:     Comments: Does not move any extremities on command  Skin:    General: Skin is warm and dry.     Coloration: Skin is pale.              Recommendations:   Change CODE STATUS to DNR-limited Daughter clear that patient has stated several times in the past she would never be accepting of trach/PEG Daughter plans to speak with family and  he will be in touch with further details PMT will continue to follow and support  I personally spent a  total of 50 minutes in the care of the patient today including preparing to see the patient, getting/reviewing separately obtained history, performing a medically appropriate exam/evaluation, counseling and educating, placing orders, referring and communicating with other health care professionals, and documenting clinical information in the EHR.   Lamarr L. Arvid, DNP, FNP-BC Palliative Medicine Team   "

## 2024-02-26 NOTE — Progress Notes (Signed)
 CC: SBO Subjective: Continues to have poor mental status Having bowel function   Objective: Vital signs in last 24 hours: Temp:  [98.1 F (36.7 C)-101.1 F (38.4 C)] 101.1 F (38.4 C) (02/05 0900) Pulse Rate:  [93-110] 107 (02/05 1045) Resp:  [26-31] 29 (02/05 1045) BP: (109-129)/(33-83) 118/33 (02/05 1000) SpO2:  [96 %-99 %] 98 % (02/05 1045) FiO2 (%):  [28 %] 28 % (02/05 0736) Weight:  [61.8 kg] 61.8 kg (02/05 0414) Last BM Date : 02/25/24  Intake/Output from previous day: 02/04 0701 - 02/05 0700 In: 2614.7 [I.V.:1430.8; IV Piggyback:1183.9] Out: 2580 [Urine:1650; Emesis/NG output:900; Stool:30] Intake/Output this shift: Total I/O In: 388.6 [I.V.:240; IV Piggyback:148.6] Out: 600 [Urine:300; Emesis/NG output:300]  Physical exam:  Abdomen is soft, non-distende today , prevena in place and taken down, midline wound without ertyhema, minor bruising at bottom Ngt with light gastric output  Lab Results: CBC  Recent Labs    02/25/24 0337 02/26/24 0400  WBC 19.7* 17.8*  HGB 10.0* 10.0*  HCT 30.9* 31.2*  PLT 69* 117*   BMET Recent Labs    02/25/24 1715 02/26/24 0400  NA 147* 153*  K 3.9 3.2*  CL 107 113*  CO2 25 26  GLUCOSE 147* 136*  BUN 56* 63*  CREATININE 0.96 0.95  CALCIUM  8.4* 8.3*   PT/INR No results for input(s): LABPROT, INR in the last 72 hours.  ABG Recent Labs    02/23/24 1401  PHART 7.43  HCO3 28.5*    Studies/Results: DG Chest Port 1 View Result Date: 02/26/2024 EXAM: 1 VIEW XRAY OF THE CHEST 02/26/2024 09:58:00 AM COMPARISON: 02/24/2024 CLINICAL HISTORY: Acute respiratory failure. FINDINGS: LINES, TUBES AND DEVICES: Endotracheal tube in place with tip 3.7 cm above carina. Enteric tube in place coursing below diaphragm with distal tip beyond inferior margin of film. Stable left PICC with tip at the cavoatrial junction. LUNGS AND PLEURA: Improved aeration of left lung base. Persistent right basilar opacity. Possible small right pleural  effusion. No pneumothorax. HEART AND MEDIASTINUM: No acute abnormality of the cardiac and mediastinal silhouettes. Large hiatal hernia. BONES AND SOFT TISSUES: No acute osseous abnormality. IMPRESSION: 1. Persistent right basilar opacity with possible small right pleural effusion. 2. Improved aeration of the left lung base. 3. Endotracheal tube, enteric tube, and left peripherally inserted central catheter in place as described. 4. Large hiatal hernia. Electronically signed by: Ryan Salvage MD 02/26/2024 10:30 AM EST RP Workstation: [id number]   MR BRAIN WO CONTRAST Result Date: 02/24/2024 EXAM: MRI BRAIN WITHOUT CONTRAST 02/24/2024 04:14:33 PM TECHNIQUE: Multiplanar multisequence MRI of the head/brain was performed without the administration of intravenous contrast. COMPARISON: CT head 02/21/2024. CLINICAL HISTORY: Mental status change, unknown cause. FINDINGS: The examination is mildly to moderately motion degraded. BRAIN AND VENTRICLES: There is no evidence of an acute infarct, intracranial hemorrhage, mass, midline shift, hydrocephalus, or extra-axial fluid collection. T2 hyperintensities in the cerebral white matter bilaterally are nonspecific but compatible with mild chronic small vessel ischemic disease. There is mild generalized cerebral atrophy. Normal flow voids. ORBITS: Bilateral cataract extraction. SINUSES AND MASTOIDS: Bilateral mastoid fluid. Clear paranasal sinuses. BONES AND SOFT TISSUES: Normal marrow signal. Partially visualized endotracheal tube. IMPRESSION: 1. No acute intracranial abnormality. 2. Mild chronic small vessel ischemic disease. 3. Cerebral Atrophy (ICD10-G31.9). Electronically signed by: Dasie Hamburg MD 02/24/2024 05:06 PM EST RP Workstation: [id number]    Anti-infectives: Anti-infectives (From admission, onward)    Start     Dose/Rate Route Frequency Ordered Stop   02/24/24 1400  vancomycin  (VANCOREADY)  IVPB 1250 mg/250 mL  Status:  Discontinued        1,250 mg 166.7  mL/hr over 90 Minutes Intravenous Every 48 hours 02/24/24 1159 02/26/24 1031   02/19/24 1200  linezolid  (ZYVOX ) IVPB 600 mg  Status:  Discontinued        600 mg 300 mL/hr over 60 Minutes Intravenous Every 12 hours 02/19/24 1057 02/20/24 1053   02/18/24 2200  piperacillin -tazobactam (ZOSYN ) IVPB 3.375 g        3.375 g 12.5 mL/hr over 240 Minutes Intravenous Every 8 hours 02/18/24 1826     02/18/24 1445  piperacillin -tazobactam (ZOSYN ) IVPB 3.375 g        3.375 g 100 mL/hr over 30 Minutes Intravenous  Once 02/18/24 1439 02/18/24 1524       Assessment/Plan:  S/p ex-lap with reduction of internal hernia, SBR, abthere placement on 1/29, back to or ON 1/30 for further resection, anastamosis and closure  Having diarrhea Leukocytosis slightly improved Off pressors, may need trach  Okay to start trickle feeds today  Continue TPN   Jayson Endow, M.D. [facility]

## 2024-02-26 NOTE — Progress Notes (Addendum)
 "  NAME:  Ashley Ryan, MRN:  [id number], DOB:  [birth year], LOS: 8 ADMISSION DATE:  02/18/2024, CONSULTATION DATE:  02/18/2024 REFERRING MD:  Dr. Fernand, CHIEF COMPLAINT:  Hypotension    Brief Pt Description / Synopsis:  79 y.o. female admitted with Small Bowel Obstruction and AKI.  Course complicated by development of Septic Shock due to Peritonitis and necrotic small bowel, status post ex-lap with reduction of internal hernia, SBR, abthere placement on 1/29, back to or ON 1/30 for further resection, anastamosis and closure.  Remains intubated post procedure.   History of Present Illness:  Ashley Ryan is a 79 y.o. 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  Per patient this started 2 days ago but since yesterday she has been progressively getting worse.  States has not had a bowel movement since 2 days. She has had multiple episodes of nausea and vomiting. Given worsening symptoms, they called EMS to get further care   ED Course: Initial Vital Signs: Temperature 97.8 F, RR 26, pulse 128, BP 73/64, SpO2 100% on room air Significant Labs: leukocytosis to 18.7, normal hemoglobin with elevated hematocrit consistent with hemoconcentration. CMP with moderate hyperglycemia, AKI, ALT and AST elevation. Lipase normal. Troponin mildly elevated but stable.  Imaging Chest X-ray>>IMPRESSION: 1. Significant interval enlargement of the hiatal hernia with diffuse gaseous distension of both the intrathoracic and intra-abdominal portions of the stomach. This suggests the possibility of gastric outlet obstruction. Recommend further evaluation with contrast-enhanced CT scan of the abdomen and pelvis. 2. Slightly increased atelectasis in the right lung surrounding the enlarging hiatal hernia. Otherwise, no acute  cardiopulmonary process. CT Abdomen & Pelvis>>IMPRESSION: 1. High grade small bowel obstruction with obstruction favored to be in the right lower quadrant slash right pelvis . 2. Small amount of free fluid and hazy mesentery within the distal small bowel and along the left pericardial gutter related to small bowel obstruction and venous congestion . 3. No pneumatosis or portal venous gas.no intraperitoneal free air. . 4. Very large hiatal hernia with half the stomach above the hemidiaphragm and markedly distended . Medications Administered: 2.5 L LR boluses, Zosyn , Zofran  and Morphine    She met SIRS criteria, therefore sepsis workup initiated and Antibiotics given. [facility] asked to admit for further workup and treatment.  General Surgery was consulted   Please see Significant Hospital Events section below for full detailed hospital course.   Pertinent  Medical History   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  Mastectomy/ SLN.   Chest pain    a. 10/2017 Cath: Nl cors.   Chronic headache    Chronic heart failure with preserved ejection fraction (HFpEF) (HCC)    a. 10/2021 Echo: EF 60-65%, no rwma, mild LVH, GrI DD, nl RV size/fxn, mild MR.   Complication of anesthesia    prior to 1991 used to have PONV.  none recently.   Diverticulosis of colon    Family history of adverse reaction to anesthesia    mother/daighter get sick   GERD with stricture    Hard of hearing    Hiatal hernia    Hypokalemia    Labile hypertension    a. 10/2017 Cath: nl renal arteries.  Mitral regurgitation    a. 10/2021 Echo: Mild MR.   Mixed hyperlipidemia    Neuropathy    feet   Osteoporosis    Pre-diabetes    Pulmonary hypertension (HCC)    a. 10/2017 Echo: PASP 35-15mmHg.   Wears dentures    full upper    Micro Data:  01/28: Blood cultures x2>>no growth 1/29: MRSA PCR + 02/03: Tracheal  aspirate>>NGTD  02/05: GI panel>>  Antimicrobials:   Anti-infectives (From admission, onward)    Start     Dose/Rate Route Frequency Ordered Stop   02/24/24 1400  vancomycin  (VANCOREADY) IVPB 1250 mg/250 mL        1,250 mg 166.7 mL/hr over 90 Minutes Intravenous Every 48 hours 02/24/24 1159     02/19/24 1200  linezolid  (ZYVOX ) IVPB 600 mg  Status:  Discontinued        600 mg 300 mL/hr over 60 Minutes Intravenous Every 12 hours 02/19/24 1057 02/20/24 1053   02/18/24 2200  piperacillin -tazobactam (ZOSYN ) IVPB 3.375 g        3.375 g 12.5 mL/hr over 240 Minutes Intravenous Every 8 hours 02/18/24 1826     02/18/24 1445  piperacillin -tazobactam (ZOSYN ) IVPB 3.375 g        3.375 g 100 mL/hr over 30 Minutes Intravenous  Once 02/18/24 1439 02/18/24 1524       Significant Hospital Events: Including procedures, antibiotic start and stop dates in addition to other pertinent events   1/28: Admitted by TR[facility] for SBO and AKI.  General Surgery consulted.  BP soft, PCCM consulted for potential vasopressors.  1/29: This morning with worsening lactic acidosis and hypotension requiring initiation of Levophed , CT with increase in edema and fluid along mesentery concerning for Peritonitis and necrotic small bowel.  Taken for emergent laparotomy with lysis of adhesions, reduction of internal hernia, small bowel resection, and placement of wound VAC  Remains intubated post procedure, tentative plan to return to OR tomorrow.  1/30: No significant events overnight.   Remains intubated and sedated, NE downtrending, went to OR for Relook. 1/31:  Remains intubated, sedation off for WUA.  Opens eyes to voice but not following commands, NE uptrending, given fluids and added Vasopressin . 2/1: On minimal vent support, more awake, SBT for a while but then became labored.  Anemic this am requiring 1 unit PRBC. But no clear signs of bleeding.  Pressors requirements downtrending.   2/2: No significant events overnight.  Off  sedation for WUA, very weak but awake and tracks.  SBT as tolerated, diurese with 40 mg IV Lasix  x1 dose.  Per Surgery may trial trickle feeds.  Off vasopressors, d/c stress dose steroids.  2/3: No acute events overnight.  Hemodynamically stable, no vasopressors.  On minimal vent support, WUA/SBT as tolerated.  Remains encephalopathic and not following commands, obtain MRI Brain.  With worsening Leukocytosis, repeat CT Abdomen/Pelvis with concern for adynamic ileus, small volume pelvic fluid but NO drainable abscess, and increased bilateral pleural effusions and progressive bibasilar airspace disease concerning for atelectasis vs pneumonia.  Will obtain PCT, tracheal aspirate, and add Vancomycin  due to previously + MRSA PCR. 2/4: MRI Brain negative pt remains mechanically intubated on minimal settings.  She is able to track but unable to follow commands precluding extubation.  Pt now having diarrhea rectal pouch place GI panel ordered  2/5: Pt remains mechanically intubated on minimal vent settings.  She is still unable to follow commands which is precluding extubation.  Palliative Care team to discuss goals of  treatment with pts family. If they want to continue with aggressive treatment pt will likely require tracheostomy placement.  Pt febrile administered 1,000 mg iv tylenol  x1 dose.  Woundvac removed per General Surgery    Interim History / Subjective:  As outlined above under Significant Hospital Events section  Objective   Blood pressure 122/67, pulse 95, temperature 98.1 F (36.7 C), resp. rate (!) 27, height 4' 11 (1.499 m), weight 61.8 kg, SpO2 98%.    Vent Mode: PRVC FiO2 (%):  [28 %] 28 % Set Rate:  [16 bmp] 16 bmp Vt Set:  [400 mL] 400 mL PEEP:  [5 cmH20] 5 cmH20 Plateau Pressure:  [20 cmH20-23 cmH20] 22 cmH20   Intake/Output Summary (Last 24 hours) at 02/26/2024 0849 Last data filed at 02/26/2024 0701 Gross per 24 hour  Intake 2674.65 ml  Output 2180 ml  Net 494.65 ml   Filed  Weights   02/24/24 0422 02/25/24 0500 02/26/24 0414  Weight: 61.1 kg 61.2 kg 61.8 kg    Examination: General: Critically ill appearing elderly female, laying in bed, intubated and sedated, NAD HENT: Atraumatic, normocephalic, neck supple, no JVD, orally intubated Lungs: Clear throughout, even, non labored  Cardiovascular: Sinus tachycardia, s1s2, no m/r/g, 2+ radial/1+distal pulses, 1+ bilateral lower extremity edema  Abdomen: Hypoactive BS x4, taut, midline abdominal incision with staples intact site clean/dry  Extremities: 1+ edema BLE, RUE with ecchymosis/edema  Neuro: LIghtly Sedated, awake opens eyes spontaneously, not following commands, PERRL GU: Purewick in place draining dark yellow urine   Resolved Hospital Problem list     Assessment & Plan:  #Acute Metabolic Encephalopathy #Sedation needs in setting of mechanical ventilation  MRI Brain 02/24/24: No acute intracranial abnormality.  Mild chronic small vessel ischemic disease. Cerebral Atrophy (ICD10-G31.9) - Maintain a RASS goal of 0  - Avoid sedating medications as able - Daily wake up assessment - Ammonia is normal, thyroid  panel is pending   #Septic Shock RESOLVED  #HFpEF without acute exacerbation #Mildly elevated Troponin due to demand ischemia PMHx: HTN, HLD, CAD Echocardiogram 02/20/24: LVEF 70-75%, unable to evaluate diastolic parameters, RV systolic function normal, RV size is normal - Continuous telemetry monitoring  - Maintain map >65: no longer requiring vasopressors  - Trend HS Troponin until peaked  - Diuresis as BP and renal function permits - US  Venous RUQ pending to r/o VTE   #Post-op Ventilator Management - Full vent support, implement lung protective strategies - Plateau pressures less than 30 cm H20 - Wean FiO2 & PEEP as tolerated to maintain O2 sats >92% - Follow intermittent Chest X-ray & ABG as needed - Spontaneous Breathing Trials when respiratory parameters met and mental status permits -  Implement VAP Bundle - Prn Bronchodilators - Pt will need tracheostomy if this aligns with goals of care   #Meets SIRS Criteria on presentation (WBC 18.7, RR 31, Pulse 121) #Severe Sepsis #Peritonitis and Small Bowel Necrosis #Concern for developing Pneumonia CT Abd Pelvis 02/24/24: Multifactorial degradation as detailed above. Interval midline laparotomy and enterotomy. Proximal small bowel loops are mildly dilated, fluid-filled, without transition point. Favor postoperative adynamic ileus. Small volume pelvic fluid, without drainable abscess. Increased bilateral pleural effusions with progressive bibasilar airspace disease which could represent atelectasis or infection. Diffuse anasarca, suggesting fluid overload. Large hiatal hernia. Nasogastric tube advanced into the intrathoracic gastric antrum. Aortic Atherosclerosis (ICD10-I70.0). - Trend wbc and monitor fever curve  - Follow cultures as above - Continue zosyn  and discontinue vancomycin  cultures & sensitivities  #Small Bowel Obstruction complicated  by Small Bowel Necrosis  #Large Hiatal Hernia S/p ex-lap with reduction of internal hernia, SBR, abthere placement on 1/29, back to or ON 1/30 for further resection, anastamosis and closure - Ok to start trickle feeds per general surgery  - TPN - General Surgery following, appreciate input   #Acute Kidney Injury ~ RESOLVED #Hyperkalemia ~ RESOLVED #Hypernatremia  #Hypokalemia  #Anion Gap Metabolic Acidosis due to lactic acidosis ~ RESOLVED  - Trend BMP  - Ensure adequate renal perfusion - Avoid nephrotoxic agents as able - Replace electrolytes as indicated ~ Pharmacy following for assistance with electrolyte replacement - Will start free water flushes with TF's  - Strict I&O's   #Shock Liver ~ IMPROVING  - Trend LFT's and coags - Avoid hepatoxic agents   #Diabetes Mellitus Type II - CBG's q6h; Target range of 140 to 180 - SSI - Follow ICU Hypo/Hyperglycemia protocol  Best  Practice (right click and Reselect all SmartList Selections daily)  Diet/type: TPN, will start trickle feeds  DVT prophylaxis: SCD GI prophylaxis: PPI Lines: PICC present and still needed  Foley: N/A Code Status:  full code Last date of multidisciplinary goals of care discussion [02/26/2024]  2/5: Will update pts family when they arrive at bedside  Labs   CBC: Recent Labs  Lab 02/22/24 0400 02/23/24 0500 02/24/24 0407 02/25/24 0337 02/26/24 0400  WBC 12.3* 17.1* 20.0* 19.7* 17.8*  HGB 10.0* 9.3* 9.4* 10.0* 10.0*  HCT 32.1* 28.9* 28.7* 30.9* 31.2*  MCV 84.0 82.6 85.9 82.2 82.3  PLT 75* 56* 56* 69* 117*    Basic Metabolic Panel: Recent Labs  Lab 02/22/24 0400 02/23/24 0500 02/24/24 0730 02/25/24 0337 02/25/24 1715 02/26/24 0400  NA 131*  129* 135 139 145 147* 153*  K 3.4*  3.4* 3.6 3.7 2.8* 3.9 3.2*  CL 96*  95* 99 102 105 107 113*  CO2 21*  21* 23 25 25 25 26   GLUCOSE 208*  204* 123* 112* 127* 147* 136*  BUN 25*  24* 31* 48* 53* 56* 63*  CREATININE 0.91  0.92 0.94 1.03* 0.92 0.96 0.95  CALCIUM  6.8*  6.7* 7.4* 8.1* 8.1* 8.4* 8.3*  MG 2.1 2.5* 2.2 1.9 2.5* 2.4  PHOS 4.0 3.2 3.7 4.2  --  4.1   GFR: Estimated Creatinine Clearance: 37.7 mL/min (by C-G formula based on SCr of 0.95 mg/dL). Recent Labs  Lab 02/19/24 0935 02/19/24 1101 02/20/24 1951 02/21/24 0430 02/21/24 0546 02/21/24 1133 02/22/24 0400 02/23/24 0500 02/24/24 0407 02/24/24 1156 02/25/24 0337 02/26/24 0400  PROCALCITON 100.00  --   --   --   --   --   --   --   --  18.10  --   --   WBC  --    < >  --    < >  --   --  12.3* 17.1* 20.0*  --  19.7* 17.8*  LATICACIDVEN  --    < > 8.3*  --  6.9* 5.4* 4.5*  --   --   --   --   --    < > = values in this interval not displayed.    Liver Function Tests: Recent Labs  Lab 02/21/24 0430 02/22/24 0400 02/23/24 0500 02/25/24 1715 02/26/24 0400  AST 558* 299* 178* 71* 61*  ALT 780* 581* 373* 153* 121*  ALKPHOS 121 154* 155* 148* 171*   BILITOT 2.1* 2.3* 2.3* 1.5* 1.4*  PROT 4.0* 4.1* 4.4* 5.4* 5.0*  ALBUMIN  2.3* 2.2*  2.1* 2.1*  2.5* 2.4*   No results for input(s): LIPASE, AMYLASE in the last 168 hours.  Recent Labs  Lab 02/23/24 1401  AMMONIA 32    ABG    Component Value Date/Time   PHART 7.43 02/23/2024 1401   PCO2ART 43 02/23/2024 1401   PO2ART 51 (L) 02/23/2024 1401   HCO3 28.5 (H) 02/23/2024 1401   ACIDBASEDEF 0.4 02/19/2024 1057   O2SAT 85.5 02/23/2024 1401     Coagulation Profile: Recent Labs  Lab 02/20/24 0345 02/20/24 1255 02/23/24 0500  INR 2.0* 1.7* 1.1    Cardiac Enzymes: No results for input(s): CKTOTAL, CKMB, CKMBINDEX, TROPONINI in the last 168 hours.  HbA1C: Hemoglobin A1C  Date/Time Value Ref Range Status  09/16/2023 08:37 AM 5.6 4.0 - 5.6 % Final   Hgb A1c MFr Bld  Date/Time Value Ref Range Status  02/18/2023 08:26 AM 6.0 4.6 - 6.5 % Final    Comment:    Glycemic Control Guidelines for People with Diabetes:Non Diabetic:  <6%Goal of Therapy: <7%Additional Action Suggested:  >8%   02/12/2022 07:36 AM 6.2 4.6 - 6.5 % Final    Comment:    Glycemic Control Guidelines for People with Diabetes:Non Diabetic:  <6%Goal of Therapy: <7%Additional Action Suggested:  >8%     CBG: Recent Labs  Lab 02/25/24 1111 02/25/24 1622 02/25/24 1902 02/25/24 2300 02/26/24 0553  GLUCAP 132* 141* 133* 128* 125*    Review of Systems:   Unable to assess due to intubation/sedation/critical illness   Past Medical History:  She,  has a past medical history of Anemia, Arthritis, Breast cancer (HCC) (06/13/2017), Chest pain, Chronic headache, Chronic heart failure with preserved ejection fraction (HFpEF) (HCC), Complication of anesthesia, Diverticulosis of colon, Family history of adverse reaction to anesthesia, GERD with stricture, Hard of hearing, Hiatal hernia, Hypokalemia, Labile hypertension, Mitral regurgitation, Mixed hyperlipidemia, Neuropathy, Osteoporosis, Pre-diabetes,  Pulmonary hypertension (HCC), and Wears dentures.   Surgical History:   Past Surgical History:  Procedure Laterality Date   ABDOMINAL HYSTERECTOMY  1978   one ovary remains   APPLICATION OF WOUND VAC  02/19/2024   Procedure: APPLICATION, WOUND VAC;  Surgeon: Marinda Jayson KIDD, MD;  Location: [facility];  Service: General;;   BOWEL RESECTION  02/19/2024   Procedure: EXCISION, SMALL INTESTINE;  Surgeon: Marinda Jayson KIDD, MD;  Location: [facility];  Service: General;;   BREAST BIOPSY Left 05/26/2017   Affirm Bx- coil clip Ductal carcinoma in situ, high nuclear grade with calcifications and focal comedonecrosis   CARDIAC CATHETERIZATION     yrs ago all OK.   CATARACT EXTRACTION W/PHACO Left 02/21/2016   Procedure: CATARACT EXTRACTION PHACO AND INTRAOCULAR LENS PLACEMENT (IOC)  Left eye;  Surgeon: Dene Etienne, MD;  Location: [facility];  Service: Ophthalmology;  Laterality: Left;  Left eye Diabetic   CATARACT EXTRACTION W/PHACO Right 03/13/2016   Procedure: CATARACT EXTRACTION PHACO AND INTRAOCULAR LENS PLACEMENT (IOC)  right  diabetic;  Surgeon: Dene Etienne, MD;  Location: [facility];  Service: Ophthalmology;  Laterality: Right;  diabetic   CHOLECYSTECTOMY  1978   COLONOSCOPY N/A 11/05/2023   Procedure: COLONOSCOPY;  Surgeon: Unk Corinn Skiff, MD;  Location: [facility];  Service: Gastroenterology;  Laterality: N/A;   COLONOSCOPY W/ POLYPECTOMY  09/09/2013   8 mm tubular adenoma of the cecum.  Lamar Aho, MD. [facility]   COLONOSCOPY WITH PROPOFOL  N/A 06/04/2019   Procedure: COLONOSCOPY WITH PROPOFOL ;  Surgeon: Jinny Carmine, MD;  Location: [facility];  Service: Endoscopy;  Laterality: N/A;  ESOPHAGOGASTRODUODENOSCOPY N/A 11/05/2023   Procedure: EGD (ESOPHAGOGASTRODUODENOSCOPY);  Surgeon: Unk Corinn Skiff, MD;  Location: [facility] ENDOS[facility];  Service: Gastroenterology;  Laterality: N/A;   ESOPHAGOGASTRODUODENOSCOPY (EGD) WITH PROPOFOL  N/A 06/04/2019    Procedure: ESOPHAGOGASTRODUODENOSCOPY (EGD) WITH PROPOFOL ;  Surgeon: Jinny Carmine, MD;  Location: [facility];  Service: Endoscopy;  Laterality: N/A;   INDOCYANINE GREEN  FLUORESCENCE IMAGING (ICG)  02/20/2024   Procedure: INDOCYANINE GREEN  FLUORESCENCE IMAGING (ICG);  Surgeon: Marinda Jayson KIDD, MD;  Location: [facility];  Service: General;;   JOINT REPLACEMENT Left    knee    LAPAROTOMY N/A 02/19/2024   Procedure: LAPAROTOMY, EXPLORATORY;  Surgeon: Marinda Jayson KIDD, MD;  Location: [facility];  Service: General;  Laterality: N/A;  Internal hernia reduciton   LAPAROTOMY N/A 02/20/2024   Procedure: LAPAROTOMY, EXPLORATORY;  Surgeon: Marinda Jayson KIDD, MD;  Location: [facility];  Service: General;  Laterality: N/A;  WOUND CLOSURE   LEFT HEART CATH AND CORONARY ANGIOGRAPHY N/A 11/06/2017   Procedure: LEFT HEART CATH AND CORONARY ANGIOGRAPHY;  Surgeon: Mady Bruckner, MD;  Location: [facility];  Service: Cardiovascular;  Laterality: N/A;   LYSIS OF ADHESION  02/19/2024   Procedure: LAPAROTOMY, FOR LYSIS OF ADHESIONS;  Surgeon: Marinda Jayson KIDD, MD;  Location: [facility];  Service: General;;   MASTECTOMY Left 06/13/2017   mastectomy neg margins   OOPHORECTOMY     one still left   RENAL ANGIOGRAPHY  11/06/2017   Procedure: RENAL ANGIOGRAPHY;  Surgeon: Mady Bruckner, MD;  Location: [facility];  Service: Cardiovascular;;   SENTINEL NODE BIOPSY Left 06/13/2017   Procedure: SENTINEL NODE BIOPSY;  Surgeon: Dessa Reyes ORN, MD;  Location: [facility];  Service: General;  Laterality: Left;   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  Surgeon: Dessa Reyes ORN, MD;  Location: [facility];  Service: General;  Laterality: Left;     Social History:   reports that she has never smoked. She has never used smokeless tobacco. She reports that she does not drink alcohol and does not use drugs.   Family History:  Her family  history includes Breast cancer in an other family member; Breast cancer (age of onset: 69) in her sister; Coronary artery disease (age of onset: 77) in her father; Diabetes in an other family member; Esophageal cancer in her brother; Heart attack (age of onset: 59) in her mother; Heart disease in an other family member; Hypertension in her father and mother; Stomach cancer in an other family member; Stroke in her father.   Allergies Allergies[1]   Home Medications  Prior to Admission medications  Medication Sig Start Date End Date Taking? Authorizing Provider  amLODipine  (NORVASC ) 5 MG tablet Take 1 tablet (5 mg total) by mouth daily. 01/28/24  Yes Bedsole, Amy E, MD  atorvastatin  (LIPITOR) 40 MG tablet Take 1 tablet (40 mg total) by mouth at bedtime. 01/23/24  Yes Bedsole, Amy E, MD  brimonidine  (ALPHAGAN ) 0.2 % ophthalmic solution brimonidine  0.2 % eye drops  INSTILL 1 DROP INTO BOTH EYES TWICE A DAY   Yes [provider]  busPIRone  (BUSPAR ) 5 MG tablet Take 1 tablet (5 mg total) by mouth 2 (two) times daily. 01/23/24  Yes Bedsole, Amy E, MD  Calcium  Carb-Cholecalciferol  (CALCIUM  600 + D) 600-5 MG-MCG TABS Take 2 tablets by mouth daily. 09/12/21  Yes Babara Call, MD  diclofenac  (VOLTAREN ) 75 MG EC tablet Take 1 tablet (75 mg total) by mouth  2 (two) times daily. 01/23/24  Yes Bedsole, Amy E, MD  diclofenac  Sodium (VOLTAREN ) 1 % GEL APPLY 2 GRAMS TOPICALLY TO THE AFFECTED AREA 4 TIMES DAILY. 06/25/21  Yes Bedsole, Amy E, MD  esomeprazole  (NEXIUM ) 40 MG capsule TAKE 1 CAPSULE TWICE DAILY BEFORE MEALS 01/23/24  Yes Bedsole, Amy E, MD  fluticasone  (FLONASE ) 50 MCG/ACT nasal spray USE 2 SPRAYS INTO THE NOSE DAILY AS DIRECTED (SUBSTITUTED FOR FLONASE ) 01/23/24  Yes Bedsole, Amy E, MD  furosemide  (LASIX ) 20 MG tablet TAKE 1 TABLET BY MOUTH EVERY DAY AS NEEDED 01/15/18  Yes Bedsole, Amy E, MD  gabapentin  (NEURONTIN ) 100 MG capsule TAKE 1 CAPSULE EVERY MORNING , 1 CAPSULE AT LUNCH, 1 CAPSULE AT DINNER, AND 2-3  CAPSULES AT BEDTIME 01/28/24  Yes Bedsole, Amy E, MD  Glucosamine 500 MG TABS Take 500 mg by mouth daily.   Yes [provider]  isosorbide  mononitrate (IMDUR ) 30 MG 24 hr tablet Take 1 tablet (30 mg total) by mouth daily. 01/23/24  Yes Bedsole, Amy E, MD  latanoprost  (XALATAN ) 0.005 % ophthalmic solution Place 1 drop into both eyes at bedtime.   Yes [provider]  lidocaine  (LIDODERM ) 5 % APPLY 1 PATCH ONTO THE SKIN EVERY DAY. REMOVE AND DISCARD PATCH WITHIN 12 HOURS OR AS DIRECTED BY PHYSICIAN. 01/23/24  Yes Bedsole, Amy E, MD  mirabegron  ER (MYRBETRIQ ) 25 MG TB24 tablet Take 1 tablet (25 mg total) by mouth daily. 01/23/24  Yes Bedsole, Amy E, MD  Omega-3 Fatty Acids (FISH OIL) 1000 MG CAPS Take 1,000 mg by mouth daily.    Yes [provider]  polycarbophil (FIBERCON) 625 MG tablet Take 625 mg by mouth daily.   Yes [provider]  potassium chloride  SA (KLOR-CON  M) 20 MEQ tablet Take 1 tablet (20 mEq total) by mouth 2 (two) times daily. 01/28/24  Yes Bedsole, Amy E, MD  traMADol  (ULTRAM ) 50 MG tablet Take 1 tablet (50 mg total) by mouth every 6 (six) hours as needed. for pain 01/28/24  Yes Bedsole, Amy E, MD  Alcohol Swabs (DROPSAFE ALCOHOL PREP) 70 % PADS USE  TO TEST BLOOD SUGAR ONE TIME DAILY 06/25/21   Avelina, Amy E, MD  Blood Glucose Calibration (ACCU-CHEK AVIVA) SOLN Use to check blood sugar once daily 10/27/18   Bedsole, Amy E, MD  Blood Glucose Calibration (TRUE METRIX LEVEL 1) Low SOLN USE AS DIRECTED 06/12/20   Bedsole, Amy E, MD  Blood Glucose Monitoring Suppl (TRUE METRIX AIR GLUCOSE METER) w/Device KIT Use to check blood sugar daily 04/30/21   Bedsole, Amy E, MD  glucose blood (TRUE METRIX BLOOD GLUCOSE TEST) test strip Use to check blood sugar daily 04/30/21   Bedsole, Amy E, MD  Iron -Vitamin C  65-125 MG TABS Take 1 tablet by mouth daily at 2 PM. Patient not taking: Reported on 02/18/2024 03/15/22   Babara Call, MD  losartan -hydrochlorothiazide  (HYZAAR) 100-25 MG  tablet Take 1 tablet by mouth daily. Patient not taking: Reported on 02/18/2024    [provider]  triamcinolone  cream (KENALOG ) 0.5 % Apply 1 application topically 2 (two) times daily. Patient not taking: Reported on 02/18/2024 08/12/19   Avelina Greig BRAVO, MD  TRUEplus Lancets 28G MISC TEST BLOOD SUGAR EVERY DAY 06/28/21   Avelina Greig BRAVO, MD     Critical Care time: 40 minutes     Lonell Moose, AGNP  Pulmonary/Critical Care Pager [phone] (please enter 7 digits) PCCM Consult Pager [phone] (please enter 7 digits)           [  1]  Allergies Allergen Reactions   Oxycodone  Nausea Only   Sulfa Antibiotics Rash    Childhood reaction   Sulfonamide Derivatives Rash    Childhood reaction.   "

## 2024-02-26 NOTE — Evaluation (Addendum)
 Occupational Therapy Evaluation Patient Details Name: ANELLY SAMARIN MRN: [id number] DOB: 22-Dec-1945 Today's Date: 02/26/2024   History of Present Illness   Pt is an 79 yo female that presented to ED for SBO, S/p emergent ex-lap with reduction of internal hernia, SBR, abthere placement on 1/29, back to or ON 1/30 for further resection, anastamosis and closure; Pt is intubated, not sedated     Clinical Impressions Chart reviewed to date, nurse cleared pt for participation in OT. Co tx completed with PT. PTA pt is MOD I-I in ADL/IADL. Pt supportive family in room throughout. She is lethargic but eyes open throughout session. Pt requires TOTAL A for ADL/mobility at this time. VSS on vent- fio2 35%, PEEP 5 PRVC mode.  ET tube/vent, PICC line, NG to suction intact pre/post session; Incision appears intact under dressing pre/post session  Patient assessed with multi-modal sensory stimulation techniques, as indicated below, to optimize alertness and arousal during skilled therapy session.   System Stimulus Response  Auditory  Calling name  Tracking to the R and Left    Command following  Inconsistent, does attempt to nod yes  when asked if she wants to be repositioned. Closes/opens her eyes on command. Tracks throughout room on command.   Vestibular Elevating HOB  Tolerates   Rolling in bed  Tolerates, eyes open/awake throughout    Proprioceptive Passive ROM  Tolerates, NT on RUE; Preference to the R towards the vent, tolerates to midline     AROM  Attempts, but ultimately unable to peform any attempts at mobility          If plan is discharge home, recommend the following:   Two people to help with walking and/or transfers;Two people to help with bathing/dressing/bathroom     Functional Status Assessment   Patient has had a recent decline in their functional status and demonstrates the ability to make significant improvements in function in a reasonable and predictable amount of  time.     Equipment Recommendations   Other (comment) (defer)     Recommendations for Other Services         Precautions/Restrictions   Precautions Precautions: Fall Recall of Precautions/Restrictions: Impaired Precaution/Restrictions Comments: vent Restrictions Weight Bearing Restrictions Per Provider Order: No     Mobility Bed Mobility Overal bed mobility: Needs Assistance Bed Mobility: Rolling Rolling: Total assist, +2 for physical assistance, +2 for safety/equipment         General bed mobility comments: to the R with wedge under L hip    Transfers                          Balance                                           ADL either performed or assessed with clinical judgement   ADL Overall ADL's : Needs assistance/impaired                                       General ADL Comments: TOTAL A for ADL at this time     Vision   Vision Assessment?: Yes Eye Alignment: Within Functional Limits Ocular Range of Motion: Within Functional Limits Tracking/Visual Pursuits: Requires cues, head turns, or add eye shifts to track  Perception         Praxis         Pertinent Vitals/Pain Pain Assessment Pain Assessment: CPOT Facial Expression: Relaxed, neutral Body Movements: Absence of movements Muscle Tension: Relaxed Compliance with ventilator (intubated pts.): Tolerating ventilator or movement Vocalization (extubated pts.): N/A CPOT Total: 0 Pain Intervention(s): Monitored during session     Extremity/Trunk Assessment Upper Extremity Assessment Upper Extremity Assessment: Generalized weakness;RUE deficits/detail;LUE deficits/detail RUE Deficits / Details: NT on this date LUE Deficits / Details: tolerates PROM to LUE; grossly [facility]   Lower Extremity Assessment Lower Extremity Assessment: Defer to PT evaluation       Communication Communication Communication: Impaired Factors Affecting  Communication: Trach/intubated   Cognition Arousal: Lethargic  Behavior During Therapy: Flat affect Cognition: Cognition impaired, Difficult to assess Difficult to assess due to: Intubated           OT - Cognition Comments: pt follows commands inconsistently, tracks around the room inconsistently                 Following commands: Impaired Following commands impaired: Follows one step commands inconsistently     Cueing  General Comments   Cueing Techniques: Verbal cues;Gestural cues;Tactile cues;Visual cues      Exercises Other Exercises Other Exercises: edu pt/family re role of OT, role of rehab, delerium precautions   Shoulder Instructions      Home Living Family/patient expects to be discharged to:: Private residence Living Arrangements: Spouse/significant other   Type of Home: House                                  Prior Functioning/Environment Prior Level of Function : Independent/Modified Independent                    OT Problem List: Decreased strength;Decreased activity tolerance;Impaired balance (sitting and/or standing);Decreased cognition;Decreased knowledge of use of DME or AE;Cardiopulmonary status limiting activity;Impaired UE functional use;Decreased safety awareness;Decreased coordination   OT Treatment/Interventions: Self-care/ADL training;Therapeutic exercise;Energy conservation;DME and/or AE instruction;Patient/family education;Cognitive remediation/compensation;Therapeutic activities;Balance training;Splinting;Neuromuscular education      OT Goals(Current goals can be found in the care plan section)   Acute Rehab OT Goals Patient Stated Goal: time for outcomes OT Goal Formulation: With family Time For Goal Achievement: 03/11/24 Potential to Achieve Goals: Good ADL Goals Additional ADL Goal #1: Pt will improve ADL engagement as evidenced by attending to task for approx 10 seconds 3/3 trials   OT Frequency:  Min  2X/week    Co-evaluation PT/OT/SLP Co-Evaluation/Treatment: Yes Reason for Co-Treatment: Complexity of the patient's impairments (multi-system involvement);Necessary to address cognition/behavior during functional activity;For patient/therapist safety   OT goals addressed during session: ADL's and self-care      AM-PAC OT 6 Clicks Daily Activity     Outcome Measure Help from another person eating meals?: Total Help from another person taking care of personal grooming?: Total Help from another person toileting, which includes using toliet, bedpan, or urinal?: Total Help from another person bathing (including washing, rinsing, drying)?: Total Help from another person to put on and taking off regular upper body clothing?: Total Help from another person to put on and taking off regular lower body clothing?: Total 6 Click Score: 6   End of Session Equipment Utilized During Treatment: Other (comment) (vent) Nurse Communication: Mobility status  Activity Tolerance: Patient tolerated treatment well;Treatment limited secondary to medical complications (Comment) Patient left: in bed;with call bell/phone within  reach;with bed alarm set;with family/visitor present  OT Visit Diagnosis: Other abnormalities of gait and mobility (R26.89);Muscle weakness (generalized) (M62.81);Other symptoms and signs involving cognitive function                Time: 1436-1456 OT Time Calculation (min): 20 min Charges:  OT General Charges $OT Visit: 1 Visit OT Evaluation $OT Eval High Complexity: 1 High  Therisa Sheffield, OTD OTR/L  02/26/24, 4:10 PM

## 2024-02-27 ENCOUNTER — Inpatient Hospital Stay

## 2024-02-27 DIAGNOSIS — K750 Abscess of liver: Secondary | ICD-10-CM

## 2024-02-27 LAB — COMPREHENSIVE METABOLIC PANEL WITH GFR
ALT: 88 U/L — ABNORMAL HIGH (ref 0–44)
AST: 46 U/L — ABNORMAL HIGH (ref 15–41)
Albumin: 2.2 g/dL — ABNORMAL LOW (ref 3.5–5.0)
Alkaline Phosphatase: 94 U/L (ref 38–126)
Anion gap: 11 (ref 5–15)
BUN: 64 mg/dL — ABNORMAL HIGH (ref 8–23)
CO2: 23 mmol/L (ref 22–32)
Calcium: 8.1 mg/dL — ABNORMAL LOW (ref 8.9–10.3)
Chloride: 118 mmol/L — ABNORMAL HIGH (ref 98–111)
Creatinine, Ser: 0.86 mg/dL (ref 0.44–1.00)
GFR, Estimated: >60 mL/min
Glucose, Bld: 146 mg/dL — ABNORMAL HIGH (ref 70–99)
Potassium: 3.6 mmol/L (ref 3.5–5.1)
Sodium: 152 mmol/L — ABNORMAL HIGH (ref 135–145)
Total Bilirubin: 0.9 mg/dL (ref 0.0–1.2)
Total Protein: 5 g/dL — ABNORMAL LOW (ref 6.5–8.1)

## 2024-02-27 LAB — GLUCOSE, CAPILLARY
Glucose-Capillary: 145 mg/dL — ABNORMAL HIGH (ref 70–99)
Glucose-Capillary: 142 mg/dL — ABNORMAL HIGH (ref 70–99)
Glucose-Capillary: 91 mg/dL (ref 70–99)
Glucose-Capillary: 129 mg/dL — ABNORMAL HIGH (ref 70–99)
Glucose-Capillary: 136 mg/dL — ABNORMAL HIGH (ref 70–99)
Glucose-Capillary: 154 mg/dL — ABNORMAL HIGH (ref 70–99)

## 2024-02-27 LAB — CULTURE, RESPIRATORY W GRAM STAIN: Culture: NO GROWTH

## 2024-02-27 LAB — CBC
HCT: 32.2 % — ABNORMAL LOW (ref 36.0–46.0)
Hemoglobin: 10 g/dL — ABNORMAL LOW (ref 12.0–15.0)
MCH: 26.7 pg (ref 26.0–34.0)
MCHC: 31.1 g/dL (ref 30.0–36.0)
MCV: 85.9 fL (ref 80.0–100.0)
Platelets: 184 10*3/uL (ref 150–400)
RBC: 3.75 MIL/uL — ABNORMAL LOW (ref 3.87–5.11)
RDW: 19.4 % — ABNORMAL HIGH (ref 11.5–15.5)
WBC: 18.7 10*3/uL — ABNORMAL HIGH (ref 4.0–10.5)
nRBC: 0.2 % (ref 0.0–0.2)

## 2024-02-27 LAB — SODIUM: Sodium: 146 mmol/L — ABNORMAL HIGH (ref 135–145)

## 2024-02-27 LAB — PHOSPHORUS: Phosphorus: 3.6 mg/dL (ref 2.5–4.6)

## 2024-02-27 LAB — NOROVIRUS GROUP 1 & 2 BY PCR, STOOL
Norovirus 1 by PCR: NEGATIVE
Norovirus 2  by PCR: NEGATIVE

## 2024-02-27 LAB — THYROID PANEL WITH TSH
Free Thyroxine Index: 0.8 — ABNORMAL LOW (ref 1.2–4.9)
T3 Uptake Ratio: 16 % — ABNORMAL LOW (ref 24–39)
T4, Total: 5.1 ug/dL (ref 4.5–12.0)
TSH: 9.04 u[IU]/mL — ABNORMAL HIGH (ref 0.450–4.500)

## 2024-02-27 LAB — MAGNESIUM: Magnesium: 2.3 mg/dL (ref 1.7–2.4)

## 2024-02-27 MED ORDER — IOHEXOL 300 MG/ML  SOLN
100.0000 mL | Freq: Once | INTRAMUSCULAR | Status: AC | PRN
  Administered 2024-02-27: 100 mL via INTRAVENOUS

## 2024-02-27 MED ORDER — DEXTROSE-SODIUM CHLORIDE 5-0.45 % IV SOLN: INTRAVENOUS | Status: AC

## 2024-02-27 MED ORDER — TRACE MINERALS CU-MN-SE-ZN 300-55-60-3000 MCG/ML IV SOLN
INTRAVENOUS | Status: AC
  Filled 2024-02-27: qty 537.6

## 2024-02-27 MED ORDER — MORPHINE SULFATE (PF) 2 MG/ML IV SOLN
1.0000 mg | Freq: Once | INTRAVENOUS | Status: AC
  Administered 2024-02-27: 1 mg via INTRAVENOUS
  Filled 2024-02-27: qty 1

## 2024-02-27 MED ORDER — SODIUM CHLORIDE 0.9 % IV SOLN
1.0000 g | Freq: Two times a day (BID) | INTRAVENOUS | Status: AC
  Administered 2024-02-27: 1 g via INTRAVENOUS
  Filled 2024-02-27: qty 20

## 2024-02-27 MED ORDER — SODIUM CHLORIDE 0.9 % IV SOLN
100.0000 mg | INTRAVENOUS | Status: AC
  Administered 2024-02-27: 100 mg via INTRAVENOUS
  Filled 2024-02-27: qty 5

## 2024-02-27 NOTE — Progress Notes (Signed)
 "                                                                                                                                                                                                          Daily Progress Note   Patient Name: Ashley Ryan       Date: 02/27/2024 DOB: 1945-07-09  Age: 79 y.o. MRN#: [id number] Attending Physician: Parris Manna, MD Primary Care Physician: Avelina Greig BRAVO, MD Admit Date: 02/18/2024  Reason for Consultation/Follow-up: Establishing goals of care  HPI/Brief Hospital Review: 79 y.o. 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   Found to have small bowel obstruction--general surgery consulted, conservative management initially with NGT placement and IVF resuscitation, AKI and hypotension but then worsening lactic acidosis, CT with increased edema, and nectoric bowel noted. Pt then taken on 1/29 for emergent laparotomy with lysis of adhesions, reduction of internal hernia, small bowel resection, and placement of wound VAC.  See previous PMT provider or CCM team notes from detailed hospitalization course--concern has remained that Ashley Ryan continues to have minimal responsiveness without sedation precluding extubation, imaging has been negative 2/6-continues to have poor mental status, today has developed fevers, leukocytosis, and abdominal distention--CT has been ordered, pending, blood cultures are being drawn  Following up today to continue goals of care conversations and coordination of care.  Subjective: Chart reviewed: Labs:Reviewed and trended since last visit Vital signs:Reviewed from today Progress Notes:CCM team, PMT, general surgery, and nursing notes since last visit Imaging:CT imaging--results pending at time of visit  Visited with Ashley Ryan at Ashley Ryan bedside. She is awake in  bed with eyes opened, remains intubated and ventilated. She is able to track with Ashley Ryan eyes, nods to simple questions appropriately, able to follow very simple commands.  Daughter at bedside during of visit. Daughter recalls our conversations from previous weekend. Daughter shares she has had conversations with PMT provider through the week. Concerns have been discussed regarding Ashley Ryan overall prognosis. Daughter shares she had a conversation with Ashley Ryan step-father overnight and he is in agreement, Ashley Ryan would not be accepting of long term life preserving interventions and would want to focus on comfort.  Daughter aware of changes today, concern for elevated WBC's, fevers and abdominal distention--testing pending. Daughter hopeful that Ashley Ryan is showing signs of neurological improvement today.  Daughter shares she has not had an opportunity to discuss  his mother's condition and goals with Ashley Ryan brother as he is struggling with acceptance.  No changes to goals of care at this time.  Answered and addressed all questions and concerns. Will follow up with family regarding results from today due to acute changes.  Objective:  Physical Exam Constitutional:      General: She is not in acute distress.    Appearance: She is ill-appearing.     Interventions: She is sedated and intubated.  Cardiovascular:     Rate and Rhythm: Regular rhythm. Tachycardia present.  Pulmonary:     Effort: Pulmonary effort is normal. No respiratory distress. She is intubated.  Abdominal:     General: There is distension.     Palpations: Abdomen is soft.  Skin:    General: Skin is warm and dry.     Findings: Bruising present.  Neurological:     Mental Status: She is alert.             Vital Signs: BP 108/66   Pulse (!) 108   Temp 100.2 F (37.9 C)   Resp (!) 31   Ht 4' 11 (1.499 m)   Wt 61.8 kg   SpO2 99%   BMI 27.52 kg/m  SpO2: SpO2: 99 % O2 Device: O2 Device: Ventilator O2 Flow Rate: O2 Flow  Rate (L/min): 4 L/min   Palliative Care Assessment & Plan   Assessment/Recommendation/Plan  Time for outcomes Results pending due to acute changes  Care plan was discussed with nursing staff.  Thank you for allowing the Palliative Medicine Team to assist in the care of this patient.  I personally spent a total of 50 minutes in the care of the patient today including preparing to see the patient, getting/reviewing separately obtained history, performing a medically appropriate exam/evaluation, counseling and educating, referring and communicating with other health care professionals, documenting clinical information in the EHR, and coordinating care.   Waddell Lesches, DNP, AGNP-C Palliative Medicine   Please contact Palliative Medicine Team phone at [phone] for questions and concerns.   "

## 2024-02-27 NOTE — Consult Note (Addendum)
 NAME: Ashley Ryan  DOB: Sep 12, 1945  MRN: [id number]  Date/Time: 02/27/2024 1:20 PM  REQUESTING PROVIDER: Michaelene Subjective:  REASON FOR CONSULT: intraabdominal infection ?No history from patient- she is intubated Daughter at bed side- spoke to her and also reviewed chart in detail Ashley Ryan is a 79 y.o. female with a history of left breast DCIS s/p mastectomy in 2019 , completed 5 years of tamoxifen , IDA. B12 def, Osteoporosis zometa  every 6 months, h/o hysterectomy presented to the ED on 1/28 with Nausea, vomiting and abdominal pain x 1 day In the ED vitals BP 73/64 Temp 97.8 HR 128 RR 26 Sats 100%   Latest Reference Range & Units 02/18/24 14:17  WBC 4.0 - 10.5 K/uL 18.7 (H)  Hemoglobin 12.0 - 15.0 g/dL 86.4  HCT 63.9 - 53.9 % 43.7  Platelets 150 - 400 K/uL 235  Creatinine 0.44 - 1.00 mg/dL 8.48 (H)   Ct abdomen and pelvis showed high grade small bowel obstruction Was started on pip tazo after blood culture sent, was seen by surgeon and the plan was NG tube , IV fluids and get KUB in 6-8 hrs with labs She detiorated later and was sent to ICU and taken to surgery on 02/19/24 and  Underwent exp lap, lysis of adhesions, Small bowel resection for necrotic small bowel due to internal hernia and wound vac placement on 1/29 On 1/30 went for a second look lap I am asked to see patient for persistent leucocytosis , fever    PICC 1/31  Daughter at be side No hisotry from patient At baseline she was very active, taking care of herself    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  Mastectomy/ SLN.   Chest pain    a. 10/2017 Cath: Nl cors.   Chronic headache    Chronic heart failure with preserved ejection fraction (HFpEF) (HCC)    a. 10/2021 Echo: EF 60-65%, no rwma, mild LVH, GrI DD, nl RV size/fxn, mild MR.   Complication of anesthesia    prior to 1991 used to have  PONV.  none recently.   Diverticulosis of colon    Family history of adverse reaction to anesthesia    mother/daighter get sick   GERD with stricture    Hard of hearing    Hiatal hernia    Hypokalemia    Labile hypertension    a. 10/2017 Cath: nl renal arteries.   Mitral regurgitation    a. 10/2021 Echo: Mild MR.   Mixed hyperlipidemia    Neuropathy    feet   Osteoporosis    Pre-diabetes    Pulmonary hypertension (HCC)    a. 10/2017 Echo: PASP 35-39mmHg.   Wears dentures    full upper    Past Surgical History:  Procedure Laterality Date   ABDOMINAL HYSTERECTOMY  1978   one ovary remains   APPLICATION OF WOUND VAC  02/19/2024   Procedure: APPLICATION, WOUND VAC;  Surgeon: Marinda Jayson KIDD, MD;  Location: [facility] ORS;  Service: General;;   BOWEL RESECTION  02/19/2024   Procedure: EXCISION, SMALL INTESTINE;  Surgeon: Marinda Jayson KIDD, MD;  Location: [facility];  Service: General;;   BREAST BIOPSY Left 05/26/2017   Affirm Bx- coil clip Ductal carcinoma in situ, high nuclear grade with calcifications and focal comedonecrosis   CARDIAC CATHETERIZATION     yrs ago all OK.  CATARACT EXTRACTION W/PHACO Left 02/21/2016   Procedure: CATARACT EXTRACTION PHACO AND INTRAOCULAR LENS PLACEMENT (IOC)  Left eye;  Surgeon: Dene Etienne, MD;  Location: [facility];  Service: Ophthalmology;  Laterality: Left;  Left eye Diabetic   CATARACT EXTRACTION W/PHACO Right 03/13/2016   Procedure: CATARACT EXTRACTION PHACO AND INTRAOCULAR LENS PLACEMENT (IOC)  right  diabetic;  Surgeon: Dene Etienne, MD;  Location: [facility];  Service: Ophthalmology;  Laterality: Right;  diabetic   CHOLECYSTECTOMY  1978   COLONOSCOPY N/A 11/05/2023   Procedure: COLONOSCOPY;  Surgeon: Unk Corinn Skiff, MD;  Location: [facility] ENDOSCOPY;  Service: Gastroenterology;  Laterality: N/A;   COLONOSCOPY W/ POLYPECTOMY  09/09/2013   8 mm tubular adenoma of the cecum.  Lamar Aho, MD. [facility]    COLONOSCOPY WITH PROPOFOL  N/A 06/04/2019   Procedure: COLONOSCOPY WITH PROPOFOL ;  Surgeon: Jinny Carmine, MD;  Location: [facility] ENDOSCOPY;  Service: Endoscopy;  Laterality: N/A;   ESOPHAGOGASTRODUODENOSCOPY N/A 11/05/2023   Procedure: EGD (ESOPHAGOGASTRODUODENOSCOPY);  Surgeon: Unk Corinn Skiff, MD;  Location: [facility] ENDOSCOPY;  Service: Gastroenterology;  Laterality: N/A;   ESOPHAGOGASTRODUODENOSCOPY (EGD) WITH PROPOFOL  N/A 06/04/2019   Procedure: ESOPHAGOGASTRODUODENOSCOPY (EGD) WITH PROPOFOL ;  Surgeon: Jinny Carmine, MD;  Location: [facility] ENDOSCOPY;  Service: Endoscopy;  Laterality: N/A;   INDOCYANINE GREEN  FLUORESCENCE IMAGING (ICG)  02/20/2024   Procedure: INDOCYANINE GREEN  FLUORESCENCE IMAGING (ICG);  Surgeon: Marinda Jayson KIDD, MD;  Location: [facility] OR[facility];  Service: General;;   JOINT REPLACEMENT Left    knee    LAPAROTOMY N/A 02/19/2024   Procedure: LAPAROTOMY, EXPLORATORY;  Surgeon: Marinda Jayson KIDD, MD;  Location: [facility];  Service: General;  Laterality: N/A;  Internal hernia reduciton   LAPAROTOMY N/A 02/20/2024   Procedure: LAPAROTOMY, EXPLORATORY;  Surgeon: Marinda Jayson KIDD, MD;  Location: [facility];  Service: General;  Laterality: N/A;  WOUND CLOSURE   LEFT HEART CATH AND CORONARY ANGIOGRAPHY N/A 11/06/2017   Procedure: LEFT HEART CATH AND CORONARY ANGIOGRAPHY;  Surgeon: Mady Bruckner, MD;  Location: [facility] INVASIVE CV LAB;  Service: Cardiovascular;  Laterality: N/A;   LYSIS OF ADHESION  02/19/2024   Procedure: LAPAROTOMY, FOR LYSIS OF ADHESIONS;  Surgeon: Marinda Jayson KIDD, MD;  Location: [facility];  Service: General;;   MASTECTOMY Left 06/13/2017   mastectomy neg margins   OOPHORECTOMY     one still left   RENAL ANGIOGRAPHY  11/06/2017   Procedure: RENAL ANGIOGRAPHY;  Surgeon: Mady Bruckner, MD;  Location: [facility];  Service: Cardiovascular;;   SENTINEL NODE BIOPSY Left 06/13/2017   Procedure: SENTINEL NODE BIOPSY;  Surgeon: Dessa Reyes ORN, MD;  Location: [facility];  Service:  General;  Laterality: Left;   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  Surgeon: Dessa Reyes ORN, MD;  Location: [facility];  Service: General;  Laterality: Left;    Social History   Socioeconomic History   Marital status: Married    Spouse name: Not on file   Number of children: 2   Years of education: Not on file   Highest education level: Not on file  Occupational History   Occupation: retired cna    Employer: RETIRED  Tobacco Use   Smoking status: Never   Smokeless tobacco: Never  Vaping Use   Vaping status: Never Used  Substance and Sexual Activity   Alcohol use: No   Drug use: No   Sexual activity: Not Currently  Other Topics Concern   Not on file  Social History Narrative   Drinks sweet tea, diet sodas 4 a day   No living will, full code (reviewed 2014)   Social Drivers of Health   Tobacco Use: Low Risk (02/20/2024)   Patient History    Smoking Tobacco Use: Never    Smokeless Tobacco Use: Never    Passive Exposure: Not on file  Financial Resource Strain: Low Risk (10/21/2023)   Overall Financial Resource Strain (CARDIA)    Difficulty of Paying Living Expenses: Not hard at all  Recent Concern: Financial Resource Strain - Medium Risk (09/26/2023)   Received from [facility]   Overall Financial Resource Strain (CARDIA)    Difficulty of Paying Living Expenses: Somewhat hard  Food Insecurity: No Food Insecurity (02/19/2024)   Epic    Worried About Running Out of Food in the Last Year: Never true    Ran Out of Food in the Last Year: Never true  Transportation Needs: No Transportation Needs (02/19/2024)   Epic    Lack of Transportation (Medical): No    Lack of Transportation (Non-Medical): No  Physical Activity: Patient Declined (10/21/2023)   Exercise Vital Sign    Days of Exercise per Week: Patient declined    Minutes of Exercise per Session: Patient declined  Recent Concern:  Physical Activity - Inactive (09/26/2023)   Received from [facility]   Exercise Vital Sign    On average, how many days per week do you engage in moderate to strenuous exercise (like a brisk walk)?: 0 days    On average, how many minutes do you engage in exercise at this level?: 0 min  Stress: No Stress Concern Present (10/21/2023)   [employer] of Occupational Health - Occupational Stress Questionnaire    Feeling of Stress: Not at all  Social Connections: Moderately Isolated (02/24/2024)   Social Connection and Isolation Panel    Frequency of Communication with Friends and Family: More than three times a week    Frequency of Social Gatherings with Friends and Family: Once a week    Attends Religious Services: Never    Database Administrator or Organizations: No    Attends Banker Meetings: Never    Marital Status: Married  Catering Manager Violence: Not At Risk (10/21/2023)   Epic    Fear of Current or Ex-Partner: No    Emotionally Abused: No    Physically Abused: No    Sexually Abused: No  Depression (PHQ2-9): Low Risk (01/28/2024)   Depression (PHQ2-9)    PHQ-2 Score: 1  Alcohol Screen: Low Risk (10/21/2023)   Alcohol Screen    Last Alcohol Screening Score (AUDIT): 0  Housing: Low Risk (02/24/2024)   Epic    Unable to Pay for Housing in the Last Year: No    Number of Times Moved in the Last Year: 0    Homeless in the Last Year: No  Utilities: Not At Risk (02/19/2024)   Epic    Threatened with loss of utilities: No  Health Literacy: Adequate Health Literacy (10/21/2023)   B1300 Health Literacy    Frequency of need for help with medical instructions: Never    Family History  Problem Relation Age of Onset   Breast cancer Other    Stomach cancer Other    Diabetes Other    Heart disease Other    Breast cancer Sister 59   Esophageal cancer Brother    Hypertension Mother    Heart attack Mother 76   Hypertension  Father    Coronary artery disease  Father 20       CABG   Stroke Father    Allergies[1] I? Current Facility-Administered Medications  Medication Dose Route Frequency Provider Last Rate Last Admin   acetaminophen  (OFIRMEV ) IV 1,000 mg  1,000 mg Intravenous Q6H PRN Nelson, Dana G, NP   Stopped at 02/27/24 0518   Chlorhexidine  Gluconate Cloth 2 % PADS 6 each  6 each Topical Nightly Assaker, Darrin, MD   6 each at 02/26/24 2131   dextrose  5 % and 0.45 % NaCl infusion   Intravenous Continuous Rust-Chester, Jenita CROME, NP 40 mL/hr at 02/27/24 1138 Infusion Verify at 02/27/24 1138   feeding supplement (VITAL 1.5 CAL) liquid 1,000 mL  1,000 mL Per Tube Continuous Assaker, Jean-Pierre, MD   Stopped at 02/27/24 0915   heparin  injection 5,000 Units  5,000 Units Subcutaneous Q12H Assaker, Darrin, MD   5,000 Units at 02/27/24 1045   insulin  aspart (novoLOG ) injection 0-9 Units  0-9 Units Subcutaneous Q4H Rust-Chester, Jenita CROME, NP   1 Units at 02/27/24 9177   mupirocin  ointment (BACTROBAN ) 2 % 1 Application  1 Application Nasal BID Isadora Hose, MD   1 Application at 02/27/24 1131   Oral care mouth rinse  15 mL Mouth Rinse Q2H Dgayli, Hose, MD   15 mL at 02/27/24 9177   Oral care mouth rinse  15 mL Mouth Rinse PRN Isadora Hose, MD       pantoprazole  (PROTONIX ) injection 40 mg  40 mg Intravenous Daily Keene, Jeremiah D, NP   40 mg at 02/27/24 1045   piperacillin -tazobactam (ZOSYN ) IVPB 3.375 g  3.375 g Intravenous Q8H Niels Kayla FALCON, RPH 12.5 mL/hr at 02/27/24 1138 Infusion Verify at 02/27/24 1138   TPN ADULT (ION)   Intravenous Continuous TPN Chappell, Alex B, RPH 60 mL/hr at 02/27/24 1138 Infusion Verify at 02/27/24 1138   TPN ADULT (ION)   Intravenous Continuous TPN Chappell, Alex B, RPH       Facility-Administered Medications Ordered in Other Encounters  Medication Dose Route Frequency Provider Last Rate Last Admin   cyanocobalamin  (VITAMIN B12) injection 1,000 mcg  1,000 mcg Intramuscular Once Babara Call, MD          Abtx:  Anti-infectives (From admission, onward)    Start     Dose/Rate Route Frequency Ordered Stop   02/24/24 1400  vancomycin  (VANCOREADY) IVPB 1250 mg/250 mL  Status:  Discontinued        1,250 mg 166.7 mL/hr over 90 Minutes Intravenous Every 48 hours 02/24/24 1159 02/26/24 1031   02/19/24 1200  linezolid  (ZYVOX ) IVPB 600 mg  Status:  Discontinued        600 mg 300 mL/hr over 60 Minutes Intravenous Every 12 hours 02/19/24 1057 02/20/24 1053   02/18/24 2200  piperacillin -tazobactam (ZOSYN ) IVPB 3.375 g        3.375 g 12.5 mL/hr over 240 Minutes Intravenous Every 8 hours 02/18/24 1826     02/18/24 1445  piperacillin -tazobactam (ZOSYN ) IVPB 3.375 g        3.375 g 100 mL/hr over 30 Minutes Intravenous  Once 02/18/24 1439 02/18/24 1524       REVIEW OF SYSTEMS:  NA : Objective:  VITALS:  BP 98/63   Pulse (!) 113   Temp (!) 100.6 F (38.1 C)   Resp (!) 30   Ht 4' 11 (1.499 m)   Wt 61.8 kg   SpO2 99%   BMI 27.52 kg/m   PHYSICAL EXAM:  General: intubated, awake, does not follow commands .  Head: Normocephalic, without obvious abnormality, atraumatic. Eyes: Conjunctivae clear, anicteric sclerae. Pupils are equal ENT cannot examine Neck:, symmetrical, no adenopathy, thyroid : non tender no carotid bruit and no JVD. Back:not examined Lungs: b/l air entry Heart:Tachycardia Abdomen: Soft,  distended.laparotomy site covered with dressing Extremities: edema hands- rt arm mild erythema Skin: No rashes or lesions. Or bruising Lymph: Cervical, supraclavicular normal. Neurologic: cannot assess Pertinent Labs Lab Results CBC    Component Value Date/Time   WBC 18.7 (H) 02/27/2024 0400   RBC 3.75 (L) 02/27/2024 0400   HGB 10.0 (L) 02/27/2024 0400   HGB 11.5 (L) 12/09/2023 1321   HGB 8.6 (L) 11/27/2013 0509   HCT 32.2 (L) 02/27/2024 0400   HCT 27.1 (L) 11/27/2013 0509   PLT 184 02/27/2024 0400   PLT 195 12/09/2023 1321   PLT 112 (L) 11/27/2013 0509   MCV 85.9  02/27/2024 0400   MCV 80 11/27/2013 0509   MCH 26.7 02/27/2024 0400   MCHC 31.1 02/27/2024 0400   RDW 19.4 (H) 02/27/2024 0400   RDW 20.3 (H) 11/27/2013 0509   LYMPHSABS 2.1 12/09/2023 1321   LYMPHSABS 0.9 (L) 11/27/2013 0509   MONOABS 0.7 12/09/2023 1321   MONOABS 0.5 11/27/2013 0509   EOSABS 0.1 12/09/2023 1321   EOSABS 0.1 11/27/2013 0509   BASOSABS 0.1 12/09/2023 1321   BASOSABS 0.0 11/27/2013 0509       Latest Ref Rng & Units 02/27/2024    4:00 AM 02/26/2024    5:22 PM 02/26/2024    4:00 AM  CMP  Glucose 70 - 99 mg/dL 853  865  863   BUN 8 - 23 mg/dL 64  63  63   Creatinine 0.44 - 1.00 mg/dL 9.13  9.08  9.04   Sodium 135 - 145 mmol/L 152  150  153   Potassium 3.5 - 5.1 mmol/L 3.6  3.4  3.2   Chloride 98 - 111 mmol/L 118  113  113   CO2 22 - 32 mmol/L 23  25  26    Calcium  8.9 - 10.3 mg/dL 8.1  8.3  8.3   Total Protein 6.5 - 8.1 g/dL 5.0   5.0   Total Bilirubin 0.0 - 1.2 mg/dL 0.9   1.4   Alkaline Phos 38 - 126 U/L 94   171   AST 15 - 41 U/L 46   61   ALT 0 - 44 U/L 88   121       Microbiology: Recent Results (from the past 240 hours)  Blood culture (routine x 2)     Status: None   Collection Time: 02/18/24  8:42 PM   Specimen: BLOOD  Result Value Ref Range Status   Specimen Description BLOOD RIGHT WRIST  Final   Special Requests   Final    BOTTLES DRAWN AEROBIC ONLY Blood Culture results may not be optimal due to an inadequate volume of blood received in culture bottles   Culture   Final    NO GROWTH 5 DAYS Performed at [facility], [address] [place], [place] [address]    Report Status 02/23/2024 FINAL  Final  Blood culture (routine x 2)     Status: None   Collection Time: 02/18/24  8:57 PM   Specimen: BLOOD  Result Value Ref Range Status   Specimen Description BLOOD RIGHT HAND  Final   Special Requests   Final    BOTTLES DRAWN AEROBIC AND ANAEROBIC Blood  Culture results may not be optimal due to an inadequate volume of blood received in  culture bottles   Culture   Final    NO GROWTH 5 DAYS Performed at [facility], [address] [place], [place] [address]    Report Status 02/23/2024 FINAL  Final  MRSA Next Gen by PCR, Nasal     Status: Abnormal   Collection Time: 02/19/24 11:00 AM   Specimen: Nasal Mucosa; Nasal Swab  Result Value Ref Range Status   MRSA by PCR Next Gen DETECTED (A) NOT DETECTED Final    Comment: RESULT CALLED TO, READ BACK BY AND VERIFIED WITH: JESSICA TAYLOR RN 1310 02/19/24 HNM (NOTE) The GeneXpert MRSA Assay (FDA approved for NASAL specimens only), is one component of a comprehensive MRSA colonization surveillance program. It is not intended to diagnose MRSA infection nor to guide or monitor treatment for MRSA infections. Test performance is not FDA approved in patients less than 31 years old. Performed at [facility], [address] [place], [place] [address]   Culture, Respiratory w Gram Stain     Status: None (Preliminary result)   Collection Time: 02/24/24 11:07 AM   Specimen: Tracheal Aspirate; Respiratory  Result Value Ref Range Status   Specimen Description   Final    TRACHEAL ASPIRATE Performed at [facility], [address] [place], [place] [address]    Special Requests   Final    NONE Performed at [facility], [address] [place], [place] [address]    Gram Stain RARE WBC PRESENT, PREDOMINANTLY PMN FEW YEAST   Final   Culture   Final    CULTURE REINCUBATED FOR BETTER GROWTH Performed at [facility], [address] [place], [place] [address]    Report Status PENDING  Incomplete  Gastrointestinal Panel by PCR , Stool     Status: None   Collection Time: 02/26/24  9:30 AM   Specimen: Stool  Result Value Ref Range Status   Campylobacter species NOT DETECTED NOT DETECTED Final   Plesimonas shigelloides NOT DETECTED NOT DETECTED Final   Salmonella species NOT DETECTED NOT DETECTED Final   Yersinia enterocolitica NOT  DETECTED NOT DETECTED Final   Vibrio species NOT DETECTED NOT DETECTED Final   Vibrio cholerae NOT DETECTED NOT DETECTED Final   Enteroaggregative E coli (EAEC) NOT DETECTED NOT DETECTED Final   Enteropathogenic E coli (EPEC) NOT DETECTED NOT DETECTED Final   Enterotoxigenic E coli (ETEC) NOT DETECTED NOT DETECTED Final   Shiga like toxin producing E coli (STEC) NOT DETECTED NOT DETECTED Final   Shigella/Enteroinvasive E coli (EIEC) NOT DETECTED NOT DETECTED Final   Cryptosporidium NOT DETECTED NOT DETECTED Final   Cyclospora cayetanensis NOT DETECTED NOT DETECTED Final   Entamoeba histolytica NOT DETECTED NOT DETECTED Final   Giardia lamblia NOT DETECTED NOT DETECTED Final   Adenovirus F40/41 NOT DETECTED NOT DETECTED Final   Astrovirus NOT DETECTED NOT DETECTED Final   Norovirus GI/GII NOT DETECTED NOT DETECTED Final   Rotavirus A NOT DETECTED NOT DETECTED Final   Sapovirus (I, II, IV, and V) NOT DETECTED NOT DETECTED Final    Comment: Performed at [facility], [address] [place], [place] [address]     IMAGING RESULTS:02/18/24 Ct abdomen and pelvis High grade small bowel obstruction with dilated loops of duodenum, proximal jejunum and ileum with air fluid levels within the loops of bowel    CT abd and pelvis from 02/27/24 irregular low density of  lateral segment of left hepatic lobe concerning for developing abscess or possible infarction   I have personally reviewed the films  02/18/24 EKG sinus tachycardia and non specific St-t changes CXR Intubated and large Hiatus hernia -02/27/24   Patient has: []  acute illness w/systemic sxs  [mod] [x]  illness posing risk to life or function  [high]  I reviewed:  (3+) [x]  primary team note [x]  consultant note(s) []  procedure/op note(s) []  micro result(s)   [x]  CBC results [x]  chemistry results [x]  radiology report(s) []  nursing note(s)  I independently visualized:  (any)   []  cxs/plates in lab [x]  plain film images [x]  CT  images []  PET images   []  path slide(s) [x]  ECG tracing []  MRI images []  nuclear scan  I discussed: (any) []  micro and/or path w/lab personnel [x]  drug options and/or interactions w/ID pharmD   []  procedure/OR findings w/other MD(s) []  echo and/or imaging w/other MD(s)   [x]  mgm't w/attending(s) involved in case []  setting up home abx w/OPAT team  Mgm't requires: []  prescription drug(s)  [mod] [x]  intensive toxicity monitoring  [high]  ? Impression/Recommendation ? ?Necrotic small bowel  due to bowel obstruction from internal hernia with sepsis  S/p lap and resection 2nd look the next day  Acute hypoxic resp failure remians intubated  Encephalopathy  Fever Worsening leucocytosis Blood culture sent CT abdomen shows left lobe liver abscess VS infarction Needs IR aspiration of the left lobe for culture Pt currently on zosyn  sine 1/28- DC Change to meropenem  and also add antifungal ( anticandida) coverage with micafungin   While on antibiotics monitor renal and liver function Watch for rash/diarrhea   AKI- resolved  Elevated transaminitis likely related to shock liver- /sepsis- resolved  Anemia  ?h/o ca breat s/p left mastectomy  Osteoporosis on 6 monthly zometa  infusion  This consult involves complex pathology management and complex antimicrobial management  Discussed the management with Daughter at bed side. Discussed with Dr.Aleskerov  On call ID physician  available for urgent issues by phone over the weekend ________________________________       [1]  Allergies Allergen Reactions   Oxycodone  Nausea Only   Sulfa Antibiotics Rash    Childhood reaction   Sulfonamide Derivatives Rash    Childhood reaction.

## 2024-02-27 NOTE — Progress Notes (Signed)
 SPIRITUAL CARE AND COUNSELING CONSULT NOTE   VISIT SUMMARY ICU Follow-up visit   SPIRITUAL ENCOUNTER                                                                                                                                                                      Type of Visit: Follow up Care provided to:: Family, Pt not available Conversation partners present during encounter: Nurse Reason for visit: Routine spiritual support OnCall Visit: No   SPIRITUAL FRAMEWORK  Presenting Themes: Significant life change, Coping tools, Courage hope and growth Values/beliefs: faith Community/Connection: Family Strengths: resilience Needs/Challenges/Barriers: Daughter just lost husband last June Patient Stress Factors: Health changes Family Stress Factors: Loss   GOALS   Clinical Care Goals: Recovery   INTERVENTIONS   Spiritual Care Interventions Made: Compassionate presence, Reflective listening, Decision-making support/facilitation, Bereavement/grief support    INTERVENTION OUTCOMES   Outcomes: Connection to spiritual care, Awareness of support, Patient family open to resources  SPIRITUAL CARE PLAN   Spiritual Care Issues Still Outstanding: Elia will continue to follow    If immediate needs arise, please contact [facility] 24 hour on call [phone]   Barnie JINNY Record, Chaplain  02/27/2024 1:47 PM

## 2024-02-27 NOTE — Progress Notes (Signed)
 Patient transported on the ventilator to CT and back with no complications.  O2 saturation and vital signs remained stable throughout.

## 2024-02-27 NOTE — Progress Notes (Signed)
 PHARMACY - TOTAL PARENTERAL NUTRITION CONSULT NOTE   Indication: massive bowel resection   Patient Measurements: Height: 4' 11 (149.9 cm) Weight: 61.8 kg (136 lb 3.9 oz) IBW/kg (Calculated) : 43.2 TPN AdjBW (KG): 46.9 Body mass index is 27.52 kg/m.  Assessment:  79 y/o female with h/o IDA, B12 deficiency, Cameron erosions, DM, large hiatal hernia, MDD, HTN, CHF, CAD, HLD, diverticulosis, PUD, breast cancer s/p mastectomy, GERD, pulmonary hypertension and chronic constipation who is admitted with AKI and septic shock secondary to peritonitis, SBO with bowel necrosis and internal hernia now s/p exploratory laparotomy, lysis of adhesions, reduction of internal hernia, small bowel resection (~18-20cm mid bowel) and placement of ABThera negative pressure wound VAC 1/29.   Glucose / Insulin :  --Hx controlled DM, last A1c 5.6% --CBGs at goal Electrolytes: Hypernatremia likely secondary to fluid losses from NGT, diarrhea and diuresis which has been discontinued Renal: SCr stable around 1 Hepatic: Transaminitis resolving. Tbili normalized Intake / Output; MIVF: N/A GI Imaging: No new pertinent imaging studies  GI Surgeries / Procedures: s/p exploratory laparotomy, lysis of adhesions, reduction of internal hernia, small bowel resection, placement of ABThera negative pressure wound VAC 1/29  Central access: 02/21/24 TPN start date: 02/21/24  Nutritional Goals: Goal TPN rate is 60 mL/hr (provides 80.6 g of protein and 1601 kcals per day)  RD Assessment: Estimated Needs Total Energy Estimated Needs: 1400-1600kcal/day Total Protein Estimated Needs: 70-80g/day Total Fluid Estimated Needs: 1.3-1.5L/day  Current Nutrition:  NPO Trickle feeds started 2/5, held 2/6 pending repeat CT D51/2 NS started at 40 cc/hr to replace fluid losses / correct hypernatremia  Plan:  --Continue TPN to 60 mL/hr at 1800 --Electrolytes in TPN (standard): Na 50mEq/L, K 20mEq/L, Ca 24mEq/L, Mg 63mEq/L, and Phos  15mmol/L. Cl:Ac 1:1 --Add standard MVI and trace elements to TPN --Continue sSSI for now and adjust as needed  --Monitor TPN labs on Mon/Thurs, daily until stable  Ashley Ryan Mare 02/27/2024,8:08 AM

## 2024-02-27 NOTE — Plan of Care (Signed)
   Problem: Metabolic: Goal: Ability to maintain appropriate glucose levels will improve Outcome: Progressing   Problem: Clinical Measurements: Goal: Cardiovascular complication will be avoided Outcome: Progressing

## 2024-02-27 NOTE — Progress Notes (Signed)
 "  NAME:  JOSHUA ZERINGUE, MRN:  [id number], DOB:  February 13, 1945, LOS: 9 ADMISSION DATE:  02/18/2024, CONSULTATION DATE:  02/18/2024 REFERRING MD:  Dr. Fernand, CHIEF COMPLAINT:  Hypotension    Brief Pt Description / Synopsis:  79 y.o. female admitted with Small Bowel Obstruction and AKI.  Course complicated by development of Septic Shock due to Peritonitis and necrotic small bowel, status post ex-lap with reduction of internal hernia, SBR, abthere placement on 1/29, back to or ON 1/30 for further resection, anastamosis and closure.  Remains intubated post procedure.   History of Present Illness:  YULISA CHIRICO is a 79 y.o. 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  Per patient this started 2 days ago but since yesterday she has been progressively getting worse.  States has not had a bowel movement since 2 days. She has had multiple episodes of nausea and vomiting. Given worsening symptoms, they called EMS to get further care   ED Course: Initial Vital Signs: Temperature 97.8 F, RR 26, pulse 128, BP 73/64, SpO2 100% on room air Significant Labs: leukocytosis to 18.7, normal hemoglobin with elevated hematocrit consistent with hemoconcentration. CMP with moderate hyperglycemia, AKI, ALT and AST elevation. Lipase normal. Troponin mildly elevated but stable.  Imaging Chest X-ray>>IMPRESSION: 1. Significant interval enlargement of the hiatal hernia with diffuse gaseous distension of both the intrathoracic and intra-abdominal portions of the stomach. This suggests the possibility of gastric outlet obstruction. Recommend further evaluation with contrast-enhanced CT scan of the abdomen and pelvis. 2. Slightly increased atelectasis in the right lung surrounding the enlarging hiatal hernia. Otherwise, no acute  cardiopulmonary process. CT Abdomen & Pelvis>>IMPRESSION: 1. High grade small bowel obstruction with obstruction favored to be in the right lower quadrant slash right pelvis . 2. Small amount of free fluid and hazy mesentery within the distal small bowel and along the left pericardial gutter related to small bowel obstruction and venous congestion . 3. No pneumatosis or portal venous gas.no intraperitoneal free air. . 4. Very large hiatal hernia with half the stomach above the hemidiaphragm and markedly distended . Medications Administered: 2.5 L LR boluses, Zosyn , Zofran  and Morphine    She met SIRS criteria, therefore sepsis workup initiated and Antibiotics given. [facility] asked to admit for further workup and treatment.  General Surgery was consulted   Please see Significant Hospital Events section below for full detailed hospital course.  02/27/24- Patient had repeat CT chest with findings of possible collection.  IR and ID consult has been ordered to help treat this. Surgery recommendation for no surgery at this time.   Pertinent  Medical History   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  Mastectomy/ SLN.   Chest pain    a. 10/2017 Cath: Nl cors.   Chronic headache    Chronic heart failure with preserved ejection fraction (HFpEF) (HCC)    a. 10/2021 Echo: EF 60-65%, no rwma, mild LVH, GrI DD, nl RV size/fxn, mild MR.   Complication of anesthesia    prior to 1991 used to have PONV.  none recently.   Diverticulosis of colon    Family history of adverse reaction to anesthesia    mother/daighter get sick  GERD with stricture    Hard of hearing    Hiatal hernia    Hypokalemia    Labile hypertension    a. 10/2017 Cath: nl renal arteries.   Mitral regurgitation    a. 10/2021 Echo: Mild MR.   Mixed hyperlipidemia    Neuropathy    feet   Osteoporosis    Pre-diabetes     Pulmonary hypertension (HCC)    a. 10/2017 Echo: PASP 35-79mmHg.   Wears dentures    full upper    Micro Data:  01/28: Blood cultures x2>>no growth 1/29: MRSA PCR + 02/03: Tracheal aspirate>>NGTD  02/05: GI panel>>  Antimicrobials:   Anti-infectives (From admission, onward)    Start     Dose/Rate Route Frequency Ordered Stop   02/24/24 1400  vancomycin  (VANCOREADY) IVPB 1250 mg/250 mL  Status:  Discontinued        1,250 mg 166.7 mL/hr over 90 Minutes Intravenous Every 48 hours 02/24/24 1159 02/26/24 1031   02/19/24 1200  linezolid  (ZYVOX ) IVPB 600 mg  Status:  Discontinued        600 mg 300 mL/hr over 60 Minutes Intravenous Every 12 hours 02/19/24 1057 02/20/24 1053   02/18/24 2200  piperacillin -tazobactam (ZOSYN ) IVPB 3.375 g        3.375 g 12.5 mL/hr over 240 Minutes Intravenous Every 8 hours 02/18/24 1826     02/18/24 1445  piperacillin -tazobactam (ZOSYN ) IVPB 3.375 g        3.375 g 100 mL/hr over 30 Minutes Intravenous  Once 02/18/24 1439 02/18/24 1524       Significant Hospital Events: Including procedures, antibiotic start and stop dates in addition to other pertinent events   1/28: Admitted by TR[facility] for SBO and AKI.  General Surgery consulted.  BP soft, PCCM consulted for potential vasopressors.  1/29: This morning with worsening lactic acidosis and hypotension requiring initiation of Levophed , CT with increase in edema and fluid along mesentery concerning for Peritonitis and necrotic small bowel.  Taken for emergent laparotomy with lysis of adhesions, reduction of internal hernia, small bowel resection, and placement of wound VAC  Remains intubated post procedure, tentative plan to return to OR tomorrow.  1/30: No significant events overnight.   Remains intubated and sedated, NE downtrending, went to OR for Relook. 1/31:  Remains intubated, sedation off for WUA.  Opens eyes to voice but not following commands, NE uptrending, given fluids and added Vasopressin . 2/1: On  minimal vent support, more awake, SBT for a while but then became labored.  Anemic this am requiring 1 unit PRBC. But no clear signs of bleeding.  Pressors requirements downtrending.   2/2: No significant events overnight.  Off sedation for WUA, very weak but awake and tracks.  SBT as tolerated, diurese with 40 mg IV Lasix  x1 dose.  Per Surgery may trial trickle feeds.  Off vasopressors, d/c stress dose steroids.  2/3: No acute events overnight.  Hemodynamically stable, no vasopressors.  On minimal vent support, WUA/SBT as tolerated.  Remains encephalopathic and not following commands, obtain MRI Brain.  With worsening Leukocytosis, repeat CT Abdomen/Pelvis with concern for adynamic ileus, small volume pelvic fluid but NO drainable abscess, and increased bilateral pleural effusions and progressive bibasilar airspace disease concerning for atelectasis vs pneumonia.  Will obtain PCT, tracheal aspirate, and add Vancomycin  due to previously + MRSA PCR. 2/4: MRI Brain negative pt remains mechanically intubated on minimal settings.  She is able to track but unable to follow commands precluding extubation.  Pt now  having diarrhea rectal pouch place GI panel ordered  2/5: Pt remains mechanically intubated on minimal vent settings.  She is still unable to follow commands which is precluding extubation.  Palliative Care team to discuss goals of treatment with pts family. If they want to continue with aggressive treatment pt will likely require tracheostomy placement.  Pt febrile administered 1,000 mg iv tylenol  x1 dose.  Woundvac removed per General Surgery    Interim History / Subjective:  As outlined above under Significant Hospital Events section  Objective   Blood pressure (!) 113/57, pulse (!) 114, temperature (!) 101.1 F (38.4 C), resp. rate (!) 40, height 4' 11 (1.499 m), weight 61.8 kg, SpO2 100%.    Vent Mode: PRVC FiO2 (%):  [28 %] 28 % Set Rate:  [16 bmp] 16 bmp Vt Set:  [400 mL] 400 mL PEEP:   [5 cmH20] 5 cmH20 Plateau Pressure:  [12 cmH20-21 cmH20] 12 cmH20   Intake/Output Summary (Last 24 hours) at 02/27/2024 1040 Last data filed at 02/27/2024 0800 Gross per 24 hour  Intake 2402.86 ml  Output 1300 ml  Net 1102.86 ml   Filed Weights   02/25/24 0500 02/26/24 0414 02/27/24 0437  Weight: 61.2 kg 61.8 kg 61.8 kg    Examination: General: Critically ill appearing elderly female, laying in bed, intubated and sedated, NAD HENT: Atraumatic, normocephalic, neck supple, no JVD, orally intubated Lungs: Clear throughout, even, non labored  Cardiovascular: Sinus tachycardia, s1s2, no m/r/g, 2+ radial/1+distal pulses, 1+ bilateral lower extremity edema  Abdomen: Hypoactive BS x4, taut, midline abdominal incision with staples intact site clean/dry  Extremities: 1+ edema BLE, RUE with ecchymosis/edema  Neuro: LIghtly Sedated, awake opens eyes spontaneously, not following commands, PERRL GU: Purewick in place draining dark yellow urine   Resolved Hospital Problem list     Assessment & Plan:   Septic shock - present on admission due to intra-abdominal source of infection.            - s/p ex-lap with reduction of internal hernia, SBR, abthere placement on 1/29, back to or ON 1/30 for further resection, anastamosis and closure         -s/p repeat CT abd- IR and ID consult for possible liver lesion/abscess Sedation needs in setting of mechanical ventilation  MRI Brain 02/24/24: No acute intracranial abnormality.  Mild chronic small vessel ischemic disease. Cerebral Atrophy (ICD10-G31.9) - Maintain a RASS goal of 0  - Avoid sedating medications as able - Daily wake up assessment - Ammonia is normal, thyroid  panel is pending  - Full vent support, implement lung protective strategies - Plateau pressures less than 30 cm H20 - Wean FiO2 & PEEP as tolerated to maintain O2 sats >92% - Follow intermittent Chest X-ray & ABG as needed - Spontaneous Breathing Trials when respiratory parameters met  and mental status permits - Implement VAP Bundle - Prn Bronchodilators   2. Acute Septic Encephalopathy- treating underlying source    #HFpEF without acute exacerbation #Mildly elevated Troponin due to demand ischemia PMHx: HTN, HLD, CAD Echocardiogram 02/20/24: LVEF 70-75%, unable to evaluate diastolic parameters, RV systolic function normal, RV size is normal - Continuous telemetry monitoring  - Maintain map >65: no longer requiring vasopressors  - Trend HS Troponin until peaked  - Diuresis as BP and renal function permits - US  Venous RUQ pending to r/o VTE     #Bilateral pleural effusions - diuresis   #Large Hiatal Hernia S- Ok to start trickle feeds per general surgery  -  TPN - General Surgery following, appreciate input   #Acute Kidney Injury ~ RESOLVED #Hyperkalemia ~ RESOLVED #Hypernatremia  #Hypokalemia  #Anion Gap Metabolic Acidosis due to lactic acidosis ~ RESOLVED  - Trend BMP  - Ensure adequate renal perfusion - Avoid nephrotoxic agents as able - Replace electrolytes as indicated ~ Pharmacy following for assistance with electrolyte replacement - Will start free water flushes with TF's  - Strict I&O's   #Shock Liver ~ IMPROVING  - Trend LFT's and coags - Avoid hepatoxic agents   #Diabetes Mellitus Type II - CBG's q6h; Target range of 140 to 180 - SSI - Follow ICU Hypo/Hyperglycemia protocol  Best Practice (right click and Reselect all SmartList Selections daily)  Diet/type: TPN, will start trickle feeds  DVT prophylaxis: SCD GI prophylaxis: PPI Lines: PICC present and still needed  Foley: N/A Code Status:  full code Last date of multidisciplinary goals of care discussion [02/26/2024]   Labs   CBC: Recent Labs  Lab 02/23/24 0500 02/24/24 0407 02/25/24 0337 02/26/24 0400 02/27/24 0400  WBC 17.1* 20.0* 19.7* 17.8* 18.7*  HGB 9.3* 9.4* 10.0* 10.0* 10.0*  HCT 28.9* 28.7* 30.9* 31.2* 32.2*  MCV 82.6 85.9 82.2 82.3 85.9  PLT 56* 56* 69* 117*  184    Basic Metabolic Panel: Recent Labs  Lab 02/23/24 0500 02/24/24 0730 02/25/24 0337 02/25/24 1715 02/26/24 0400 02/26/24 1722 02/27/24 0400  NA 135 139 145 147* 153* 150* 152*  K 3.6 3.7 2.8* 3.9 3.2* 3.4* 3.6  CL 99 102 105 107 113* 113* 118*  CO2 23 25 25 25 26 25 23   GLUCOSE 123* 112* 127* 147* 136* 134* 146*  BUN 31* 48* 53* 56* 63* 63* 64*  CREATININE 0.94 1.03* 0.92 0.96 0.95 0.91 0.86  CALCIUM  7.4* 8.1* 8.1* 8.4* 8.3* 8.3* 8.1*  MG 2.5* 2.2 1.9 2.5* 2.4  --  2.3  PHOS 3.2 3.7 4.2  --  4.1  --  3.6   GFR: Estimated Creatinine Clearance: 41.7 mL/min (by C-G formula based on SCr of 0.86 mg/dL). Recent Labs  Lab 02/20/24 1951 02/21/24 0430 02/21/24 0546 02/21/24 1133 02/22/24 0400 02/23/24 0500 02/24/24 0407 02/24/24 1156 02/25/24 0337 02/26/24 0400 02/27/24 0400  PROCALCITON  --   --   --   --   --   --   --  18.10  --   --   --   WBC  --    < >  --   --  12.3*   < > 20.0*  --  19.7* 17.8* 18.7*  LATICACIDVEN 8.3*  --  6.9* 5.4* 4.5*  --   --   --   --   --   --    < > = values in this interval not displayed.    Liver Function Tests: Recent Labs  Lab 02/22/24 0400 02/23/24 0500 02/25/24 1715 02/26/24 0400 02/27/24 0400  AST 299* 178* 71* 61* 46*  ALT 581* 373* 153* 121* 88*  ALKPHOS 154* 155* 148* 171* 94  BILITOT 2.3* 2.3* 1.5* 1.4* 0.9  PROT 4.1* 4.4* 5.4* 5.0* 5.0*  ALBUMIN  2.2*  2.1* 2.1* 2.5* 2.4* 2.2*   No results for input(s): LIPASE, AMYLASE in the last 168 hours.  Recent Labs  Lab 02/23/24 1401  AMMONIA 32    ABG    Component Value Date/Time   PHART 7.43 02/23/2024 1401   PCO2ART 43 02/23/2024 1401   PO2ART 51 (L) 02/23/2024 1401   HCO3 28.5 (H) 02/23/2024 1401  ACIDBASEDEF 0.4 02/19/2024 1057   O2SAT 85.5 02/23/2024 1401     Coagulation Profile: Recent Labs  Lab 02/20/24 1255 02/23/24 0500  INR 1.7* 1.1    Cardiac Enzymes: No results for input(s): CKTOTAL, CKMB, CKMBINDEX, TROPONINI in the last  168 hours.  HbA1C: Hemoglobin A1C  Date/Time Value Ref Range Status  09/16/2023 08:37 AM 5.6 4.0 - 5.6 % Final   Hgb A1c MFr Bld  Date/Time Value Ref Range Status  02/18/2023 08:26 AM 6.0 4.6 - 6.5 % Final    Comment:    Glycemic Control Guidelines for People with Diabetes:Non Diabetic:  <6%Goal of Therapy: <7%Additional Action Suggested:  >8%   02/12/2022 07:36 AM 6.2 4.6 - 6.5 % Final    Comment:    Glycemic Control Guidelines for People with Diabetes:Non Diabetic:  <6%Goal of Therapy: <7%Additional Action Suggested:  >8%     CBG: Recent Labs  Lab 02/26/24 1722 02/26/24 1951 02/26/24 2322 02/27/24 0339 02/27/24 0750  GLUCAP 115* 142* 150* 145* 142*    Review of Systems:   Unable to assess due to intubation/sedation/critical illness   Past Medical History:  She,  has a past medical history of Anemia, Arthritis, Breast cancer (HCC) (06/13/2017), Chest pain, Chronic headache, Chronic heart failure with preserved ejection fraction (HFpEF) (HCC), Complication of anesthesia, Diverticulosis of colon, Family history of adverse reaction to anesthesia, GERD with stricture, Hard of hearing, Hiatal hernia, Hypokalemia, Labile hypertension, Mitral regurgitation, Mixed hyperlipidemia, Neuropathy, Osteoporosis, Pre-diabetes, Pulmonary hypertension (HCC), and Wears dentures.   Surgical History:   Past Surgical History:  Procedure Laterality Date   ABDOMINAL HYSTERECTOMY  1978   one ovary remains   APPLICATION OF WOUND VAC  02/19/2024   Procedure: APPLICATION, WOUND VAC;  Surgeon: Marinda Jayson KIDD, MD;  Location: [facility] OR[facility];  Service: General;;   BOWEL RESECTION  02/19/2024   Procedure: EXCISION, SMALL INTESTINE;  Surgeon: Marinda Jayson KIDD, MD;  Location: [facility];  Service: General;;   BREAST BIOPSY Left 05/26/2017   Affirm Bx- coil clip Ductal carcinoma in situ, high nuclear grade with calcifications and focal comedonecrosis   CARDIAC CATHETERIZATION     yrs ago all OK.   CATARACT  EXTRACTION W/PHACO Left 02/21/2016   Procedure: CATARACT EXTRACTION PHACO AND INTRAOCULAR LENS PLACEMENT (IOC)  Left eye;  Surgeon: Dene Etienne, MD;  Location: [facility];  Service: Ophthalmology;  Laterality: Left;  Left eye Diabetic   CATARACT EXTRACTION W/PHACO Right 03/13/2016   Procedure: CATARACT EXTRACTION PHACO AND INTRAOCULAR LENS PLACEMENT (IOC)  right  diabetic;  Surgeon: Dene Etienne, MD;  Location: [facility];  Service: Ophthalmology;  Laterality: Right;  diabetic   CHOLECYSTECTOMY  1978   COLONOSCOPY N/A 11/05/2023   Procedure: COLONOSCOPY;  Surgeon: Unk Corinn Skiff, MD;  Location: [facility] ENDOSCOPY;  Service: Gastroenterology;  Laterality: N/A;   COLONOSCOPY W/ POLYPECTOMY  09/09/2013   8 mm tubular adenoma of the cecum.  Lamar Aho, [facility]   COLONOSCOPY WITH PROPOFOL  N/A 06/04/2019   Procedure: COLONOSCOPY WITH PROPOFOL ;  Surgeon: Jinny Carmine, MD;  Location: [facility] ENDOSCOPY;  Service: Endoscopy;  Laterality: N/A;   ESOPHAGOGASTRODUODENOSCOPY N/A 11/05/2023   Procedure: EGD (ESOPHAGOGASTRODUODENOSCOPY);  Surgeon: Unk Corinn Skiff, MD;  Location: [facility] ENDOSCOPY;  Service: Gastroenterology;  Laterality: N/A;   ESOPHAGOGASTRODUODENOSCOPY (EGD) WITH PROPOFOL  N/A 06/04/2019   Procedure: ESOPHAGOGASTRODUODENOSCOPY (EGD) WITH PROPOFOL ;  Surgeon: Jinny Carmine, MD;  Location: [facility] ENDOSCOPY;  Service: Endoscopy;  Laterality: N/A;   INDOCYANINE GREEN  FLUORESCENCE IMAGING (ICG)  02/20/2024  Procedure: INDOCYANINE GREEN  FLUORESCENCE IMAGING (ICG);  Surgeon: Marinda Jayson KIDD, MD;  Location: [facility];  Service: General;;   JOINT REPLACEMENT Left    knee    LAPAROTOMY N/A 02/19/2024   Procedure: LAPAROTOMY, EXPLORATORY;  Surgeon: Marinda Jayson KIDD, MD;  Location: [facility];  Service: General;  Laterality: N/A;  Internal hernia reduciton   LAPAROTOMY N/A 02/20/2024   Procedure: LAPAROTOMY, EXPLORATORY;  Surgeon: Marinda Jayson KIDD, MD;  Location: [facility]  ORS;  Service: General;  Laterality: N/A;  WOUND CLOSURE   LEFT HEART CATH AND CORONARY ANGIOGRAPHY N/A 11/06/2017   Procedure: LEFT HEART CATH AND CORONARY ANGIOGRAPHY;  Surgeon: Mady Bruckner, MD;  Location: [facility];  Service: Cardiovascular;  Laterality: N/A;   LYSIS OF ADHESION  02/19/2024   Procedure: LAPAROTOMY, FOR LYSIS OF ADHESIONS;  Surgeon: Marinda Jayson KIDD, MD;  Location: [facility];  Service: General;;   MASTECTOMY Left 06/13/2017   mastectomy neg margins   OOPHORECTOMY     one still left   RENAL ANGIOGRAPHY  11/06/2017   Procedure: RENAL ANGIOGRAPHY;  Surgeon: Mady Bruckner, MD;  Location: [facility];  Service: Cardiovascular;;   SENTINEL NODE BIOPSY Left 06/13/2017   Procedure: SENTINEL NODE BIOPSY;  Surgeon: Dessa Reyes ORN, MD;  Location: [facility];  Service: General;  Laterality: Left;   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  Surgeon: Dessa Reyes ORN, MD;  Location: [facility] ORS;  Service: General;  Laterality: Left;     Social History:   reports that she has never smoked. She has never used smokeless tobacco. She reports that she does not drink alcohol and does not use drugs.   Family History:  Her family history includes Breast cancer in an other family member; Breast cancer (age of onset: 57) in her sister; Coronary artery disease (age of onset: 80) in her father; Diabetes in an other family member; Esophageal cancer in her brother; Heart attack (age of onset: 58) in her mother; Heart disease in an other family member; Hypertension in her father and mother; Stomach cancer in an other family member; Stroke in her father.   Allergies Allergies[1]   Home Medications  Prior to Admission medications  Medication Sig Start Date End Date Taking? Authorizing Provider  amLODipine  (NORVASC ) 5 MG tablet Take 1 tablet (5 mg total) by mouth daily. 01/28/24  Yes Bedsole, Amy E, MD   atorvastatin  (LIPITOR) 40 MG tablet Take 1 tablet (40 mg total) by mouth at bedtime. 01/23/24  Yes Bedsole, Amy E, MD  brimonidine  (ALPHAGAN ) 0.2 % ophthalmic solution brimonidine  0.2 % eye drops  INSTILL 1 DROP INTO BOTH EYES TWICE A DAY   Yes [provider]  busPIRone  (BUSPAR ) 5 MG tablet Take 1 tablet (5 mg total) by mouth 2 (two) times daily. 01/23/24  Yes Bedsole, Amy E, MD  Calcium  Carb-Cholecalciferol  (CALCIUM  600 + D) 600-5 MG-MCG TABS Take 2 tablets by mouth daily. 09/12/21  Yes Babara Call, MD  diclofenac  (VOLTAREN ) 75 MG EC tablet Take 1 tablet (75 mg total) by mouth 2 (two) times daily. 01/23/24  Yes Bedsole, Amy E, MD  diclofenac  Sodium (VOLTAREN ) 1 % GEL APPLY 2 GRAMS TOPICALLY TO THE AFFECTED AREA 4 TIMES DAILY. 06/25/21  Yes Bedsole, Amy E, MD  esomeprazole  (NEXIUM ) 40 MG capsule TAKE 1 CAPSULE TWICE DAILY BEFORE MEALS 01/23/24  Yes Bedsole, Amy E, MD  fluticasone  (FLONASE ) 50 MCG/ACT nasal spray USE 2  SPRAYS INTO THE NOSE DAILY AS DIRECTED (SUBSTITUTED FOR FLONASE ) 01/23/24  Yes Bedsole, Amy E, MD  furosemide  (LASIX ) 20 MG tablet TAKE 1 TABLET BY MOUTH EVERY DAY AS NEEDED 01/15/18  Yes Bedsole, Amy E, MD  gabapentin  (NEURONTIN ) 100 MG capsule TAKE 1 CAPSULE EVERY MORNING , 1 CAPSULE AT LUNCH, 1 CAPSULE AT DINNER, AND 2-3 CAPSULES AT BEDTIME 01/28/24  Yes Bedsole, Amy E, MD  Glucosamine 500 MG TABS Take 500 mg by mouth daily.   Yes [provider]  isosorbide  mononitrate (IMDUR ) 30 MG 24 hr tablet Take 1 tablet (30 mg total) by mouth daily. 01/23/24  Yes Bedsole, Amy E, MD  latanoprost  (XALATAN ) 0.005 % ophthalmic solution Place 1 drop into both eyes at bedtime.   Yes [provider]  lidocaine  (LIDODERM ) 5 % APPLY 1 PATCH ONTO THE SKIN EVERY DAY. REMOVE AND DISCARD PATCH WITHIN 12 HOURS OR AS DIRECTED BY PHYSICIAN. 01/23/24  Yes Bedsole, Amy E, MD  mirabegron  ER (MYRBETRIQ ) 25 MG TB24 tablet Take 1 tablet (25 mg total) by mouth daily. 01/23/24  Yes Bedsole, Amy E, MD  Omega-3  Fatty Acids (FISH OIL) 1000 MG CAPS Take 1,000 mg by mouth daily.    Yes [provider]  polycarbophil (FIBERCON) 625 MG tablet Take 625 mg by mouth daily.   Yes [provider]  potassium chloride  SA (KLOR-CON  M) 20 MEQ tablet Take 1 tablet (20 mEq total) by mouth 2 (two) times daily. 01/28/24  Yes Bedsole, Amy E, MD  traMADol  (ULTRAM ) 50 MG tablet Take 1 tablet (50 mg total) by mouth every 6 (six) hours as needed. for pain 01/28/24  Yes Bedsole, Amy E, MD  Alcohol Swabs (DROPSAFE ALCOHOL PREP) 70 % PADS USE  TO TEST BLOOD SUGAR ONE TIME DAILY 06/25/21   Avelina, Amy E, MD  Blood Glucose Calibration (ACCU-CHEK AVIVA) SOLN Use to check blood sugar once daily 10/27/18   Bedsole, Amy E, MD  Blood Glucose Calibration (TRUE METRIX LEVEL 1) Low SOLN USE AS DIRECTED 06/12/20   Bedsole, Amy E, MD  Blood Glucose Monitoring Suppl (TRUE METRIX AIR GLUCOSE METER) w/Device KIT Use to check blood sugar daily 04/30/21   Bedsole, Amy E, MD  glucose blood (TRUE METRIX BLOOD GLUCOSE TEST) test strip Use to check blood sugar daily 04/30/21   Bedsole, Amy E, MD  Iron -Vitamin C  65-125 MG TABS Take 1 tablet by mouth daily at 2 PM. Patient not taking: Reported on 02/18/2024 03/15/22   Babara Call, MD  losartan -hydrochlorothiazide  (HYZAAR) 100-25 MG tablet Take 1 tablet by mouth daily. Patient not taking: Reported on 02/18/2024    [provider]  triamcinolone  cream (KENALOG ) 0.5 % Apply 1 application topically 2 (two) times daily. Patient not taking: Reported on 02/18/2024 08/12/19   Avelina Greig BRAVO, MD  TRUEplus Lancets 28G MISC TEST BLOOD SUGAR EVERY DAY 06/28/21   Avelina Greig BRAVO, MD    Critical care provider statement:   Total critical care time: 63 minutes   Performed by: Parris MD   Critical care time was exclusive of separately billable procedures and treating other patients.   Critical care was necessary to treat or prevent imminent or life-threatening deterioration.   Critical care was time  spent personally by me on the following activities: development of treatment plan with patient and/or surrogate as well as nursing, discussions with consultants, evaluation of patient's response to treatment, examination of patient, obtaining history from patient or surrogate, ordering and performing treatments and interventions, ordering and  review of laboratory studies, ordering and review of radiographic studies, pulse oximetry and re-evaluation of patient's condition.    Cricket Goodlin, M.D.  Pulmonary & Critical Care Medicine             [1]  Allergies Allergen Reactions   Oxycodone  Nausea Only   Sulfa Antibiotics Rash    Childhood reaction   Sulfonamide Derivatives Rash    Childhood reaction.   "

## 2024-02-27 NOTE — Plan of Care (Signed)
" °  Problem: Metabolic: Goal: Ability to maintain appropriate glucose levels will improve Outcome: Progressing   Problem: Nutritional: Goal: Maintenance of adequate nutrition will improve Outcome: Progressing   Problem: Skin Integrity: Goal: Risk for impaired skin integrity will decrease Outcome: Progressing   Problem: Clinical Measurements: Goal: Ability to maintain clinical measurements within normal limits will improve Outcome: Progressing Goal: Will remain free from infection Outcome: Progressing Goal: Diagnostic test results will improve Outcome: Progressing Goal: Respiratory complications will improve Outcome: Progressing Goal: Cardiovascular complication will be avoided Outcome: Progressing   Problem: Activity: Goal: Risk for activity intolerance will decrease Outcome: Not Progressing   Problem: Pain Managment: Goal: General experience of comfort will improve and/or be controlled Outcome: Progressing   "

## 2024-02-27 NOTE — Progress Notes (Signed)
 CC: SBO Subjective: Continues to have poor mental status Fevers and distention Does seem to track with eyes today, hard to tell if she follows commands, does not squeeze my hand or give thumbs up   Objective: Vital signs in last 24 hours: Temp:  [97.9 F (36.6 C)-101.8 F (38.8 C)] 101.1 F (38.4 C) (02/06 0900) Pulse Rate:  [97-125] 114 (02/06 0900) Resp:  [26-40] 40 (02/06 0900) BP: (72-134)/(21-84) 113/57 (02/06 0900) SpO2:  [97 %-100 %] 100 % (02/06 0900) FiO2 (%):  [28 %] 28 % (02/06 0800) Weight:  [61.8 kg] 61.8 kg (02/06 0437) Last BM Date : 02/26/24  Intake/Output from previous day: 02/05 0701 - 02/06 0700 In: 2523.3 [I.V.:1441.4; NG/GT:255; IV Piggyback:826.9] Out: 1900 [Urine:1350; Emesis/NG output:400; Stool:150] Intake/Output this shift: Total I/O In: 268.1 [I.V.:215; NG/GT:43; IV Piggyback:10.2] Out: -   Physical exam:  Abdomen is soft, distended today , prevena in place and taken down, midline wound without ertyhema, minor bruising at bottom with some old blood drained Ngt in place  Lab Results: CBC  Recent Labs    02/26/24 0400 02/27/24 0400  WBC 17.8* 18.7*  HGB 10.0* 10.0*  HCT 31.2* 32.2*  PLT 117* 184   BMET Recent Labs    02/26/24 1722 02/27/24 0400  NA 150* 152*  K 3.4* 3.6  CL 113* 118*  CO2 25 23  GLUCOSE 134* 146*  BUN 63* 64*  CREATININE 0.91 0.86  CALCIUM  8.3* 8.1*   PT/INR No results for input(s): LABPROT, INR in the last 72 hours.  ABG No results for input(s): PHART, HCO3 in the last 72 hours.  Invalid input(s): PCO2, PO2   Studies/Results: DG Chest Port 1 View Result Date: 02/27/2024 EXAM: 1 VIEW(S) XRAY OF THE CHEST 02/27/2024 01:11:30 AM COMPARISON: 02/26/2024 CLINICAL HISTORY: Encounter for intubation. FINDINGS: LINES, TUBES AND DEVICES: Endotracheal tube in place with tip 1.5 cm above the carina. Enteric tube courses below the diaphragm with tip nonvisualized. Left upper extremity PICC with tip  overlying the expected region of the superior cavoatrial junction, stable in position. LUNGS AND PLEURA: Stable right basilar platelike opacity consistent with atelectasis. No pleural effusion. No pneumothorax. HEART AND MEDIASTINUM: No acute abnormality of the cardiac and mediastinal silhouettes. BONES AND SOFT TISSUES: No acute osseous abnormality. DIAPHRAGM AND UPPER ABDOMEN: Large hiatal hernia. IMPRESSION: 1. Endotracheal tube tip 1.5 cm above the carina. 2. Additional support apparatus as above. 3. Large hiatal hernia. Electronically signed by: Pinkie Pebbles MD 02/27/2024 01:14 AM EST RP Workstation: [id number]   US  Venous Img Upper Uni Right(DVT) Result Date: 02/26/2024 CLINICAL DATA:  Swelling in the right upper extremity EXAM: Right UPPER EXTREMITY VENOUS DOPPLER ULTRASOUND TECHNIQUE: Gray-scale sonography with graded compression, as well as color Doppler and duplex ultrasound were performed to evaluate the upper extremity deep venous system from the level of the subclavian vein and including the jugular, axillary, basilic, radial, ulnar and upper cephalic vein. Spectral Doppler was utilized to evaluate flow at rest and with distal augmentation maneuvers. COMPARISON:  None Available. FINDINGS: Contralateral Subclavian Vein: Respiratory phasicity is normal and symmetric with the symptomatic side. No evidence of thrombus. Normal compressibility. Internal Jugular Vein: No evidence of thrombus. Normal compressibility, respiratory phasicity and response to augmentation. Subclavian Vein: No evidence of thrombus. Normal compressibility, respiratory phasicity and response to augmentation. Axillary Vein: No evidence of thrombus. Normal compressibility, respiratory phasicity and response to augmentation. Cephalic Vein: Is non compressible and mildly dilated without phasicity or augmentation and consistent with acute SVT. The occlusive  portion is from the wrist to the distal upper arm. Basilic Vein: The proximal  forearm portion of the basilic vein is noncompressible without phasicity and mildly dilated consistent with acute SVT. Brachial Veins: No evidence of thrombus. Normal compressibility, respiratory phasicity and response to augmentation. Radial Veins: No evidence of thrombus. Normal compressibility, respiratory phasicity and response to augmentation. Ulnar Veins: No evidence of thrombus. Normal compressibility, respiratory phasicity and response to augmentation. Venous Reflux:  None visualized. Other Findings:  None visualized. IMPRESSION: No evidence of DVT within the right upper extremity. Short segment occlusive acute SVT is seen in the right basilic and cephalic veins. Electronically Signed   By: Cordella Banner   On: 02/26/2024 13:58   DG Chest Port 1 View Result Date: 02/26/2024 EXAM: 1 VIEW XRAY OF THE CHEST 02/26/2024 09:58:00 AM COMPARISON: 02/24/2024 CLINICAL HISTORY: Acute respiratory failure. FINDINGS: LINES, TUBES AND DEVICES: Endotracheal tube in place with tip 3.7 cm above carina. Enteric tube in place coursing below diaphragm with distal tip beyond inferior margin of film. Stable left PICC with tip at the cavoatrial junction. LUNGS AND PLEURA: Improved aeration of left lung base. Persistent right basilar opacity. Possible small right pleural effusion. No pneumothorax. HEART AND MEDIASTINUM: No acute abnormality of the cardiac and mediastinal silhouettes. Large hiatal hernia. BONES AND SOFT TISSUES: No acute osseous abnormality. IMPRESSION: 1. Persistent right basilar opacity with possible small right pleural effusion. 2. Improved aeration of the left lung base. 3. Endotracheal tube, enteric tube, and left peripherally inserted central catheter in place as described. 4. Large hiatal hernia. Electronically signed by: Ryan Salvage MD 02/26/2024 10:30 AM EST RP Workstation: [id number]    Anti-infectives: Anti-infectives (From admission, onward)    Start     Dose/Rate Route Frequency Ordered  Stop   02/24/24 1400  vancomycin  (VANCOREADY) IVPB 1250 mg/250 mL  Status:  Discontinued        1,250 mg 166.7 mL/hr over 90 Minutes Intravenous Every 48 hours 02/24/24 1159 02/26/24 1031   02/19/24 1200  linezolid  (ZYVOX ) IVPB 600 mg  Status:  Discontinued        600 mg 300 mL/hr over 60 Minutes Intravenous Every 12 hours 02/19/24 1057 02/20/24 1053   02/18/24 2200  piperacillin -tazobactam (ZOSYN ) IVPB 3.375 g        3.375 g 12.5 mL/hr over 240 Minutes Intravenous Every 8 hours 02/18/24 1826     02/18/24 1445  piperacillin -tazobactam (ZOSYN ) IVPB 3.375 g        3.375 g 100 mL/hr over 30 Minutes Intravenous  Once 02/18/24 1439 02/18/24 1524       Assessment/Plan:  S/p ex-lap with reduction of internal hernia, SBR, abthere placement on 1/29, back to or ON 1/30 for further resection, anastamosis and closure  Fevers and worsening leukocytosis Will get repeat CT scan with IV Contrast Stopped tube feeds, hold for now until CT done, does not need oral contrast for CT Continue TPN   Jayson Endow, M.D. [facility]

## 2024-03-11 ENCOUNTER — Inpatient Hospital Stay: Attending: Oncology

## 2024-03-12 ENCOUNTER — Other Ambulatory Visit

## 2024-03-19 ENCOUNTER — Encounter: Admitting: Family Medicine

## 2024-04-08 ENCOUNTER — Inpatient Hospital Stay: Attending: Oncology

## 2024-05-10 ENCOUNTER — Inpatient Hospital Stay: Attending: Oncology

## 2024-06-08 ENCOUNTER — Inpatient Hospital Stay

## 2024-06-09 ENCOUNTER — Inpatient Hospital Stay: Admitting: Oncology

## 2024-06-09 ENCOUNTER — Inpatient Hospital Stay

## 2024-10-26 ENCOUNTER — Ambulatory Visit

## 2024-10-29 ENCOUNTER — Ambulatory Visit
